# Patient Record
Sex: Female | Born: 1980 | Race: White | Hispanic: No | Marital: Single | Smoking: Never smoker
Health system: Southern US, Community
[De-identification: ages and names within clinical notes are randomized; demographics above are authoritative.]

## PROBLEM LIST (undated history)

## (undated) DIAGNOSIS — E282 Polycystic ovarian syndrome: Secondary | ICD-10-CM

## (undated) DIAGNOSIS — D497 Neoplasm of unspecified behavior of endocrine glands and other parts of nervous system: Secondary | ICD-10-CM

## (undated) DIAGNOSIS — R569 Unspecified convulsions: Secondary | ICD-10-CM

## (undated) DIAGNOSIS — K859 Acute pancreatitis without necrosis or infection, unspecified: Secondary | ICD-10-CM

## (undated) DIAGNOSIS — I10 Essential (primary) hypertension: Secondary | ICD-10-CM

## (undated) DIAGNOSIS — K861 Other chronic pancreatitis: Secondary | ICD-10-CM

## (undated) DIAGNOSIS — E271 Primary adrenocortical insufficiency: Secondary | ICD-10-CM

## (undated) DIAGNOSIS — D332 Benign neoplasm of brain, unspecified: Secondary | ICD-10-CM

## (undated) DIAGNOSIS — H9190 Unspecified hearing loss, unspecified ear: Secondary | ICD-10-CM

## (undated) DIAGNOSIS — F1290 Cannabis use, unspecified, uncomplicated: Secondary | ICD-10-CM

## (undated) DIAGNOSIS — R197 Diarrhea, unspecified: Secondary | ICD-10-CM

## (undated) DIAGNOSIS — F419 Anxiety disorder, unspecified: Secondary | ICD-10-CM

## (undated) DIAGNOSIS — Z9889 Other specified postprocedural states: Secondary | ICD-10-CM

## (undated) DIAGNOSIS — Z79891 Long term (current) use of opiate analgesic: Secondary | ICD-10-CM

## (undated) DIAGNOSIS — A0472 Enterocolitis due to Clostridium difficile, not specified as recurrent: Secondary | ICD-10-CM

## (undated) DIAGNOSIS — R519 Headache, unspecified: Secondary | ICD-10-CM

## (undated) DIAGNOSIS — T50901A Poisoning by unspecified drugs, medicaments and biological substances, accidental (unintentional), initial encounter: Secondary | ICD-10-CM

## (undated) DIAGNOSIS — R4189 Other symptoms and signs involving cognitive functions and awareness: Secondary | ICD-10-CM

## (undated) DIAGNOSIS — N200 Calculus of kidney: Secondary | ICD-10-CM

## (undated) DIAGNOSIS — T40601A Poisoning by unspecified narcotics, accidental (unintentional), initial encounter: Secondary | ICD-10-CM

## (undated) DIAGNOSIS — E876 Hypokalemia: Secondary | ICD-10-CM

## (undated) DIAGNOSIS — F1120 Opioid dependence, uncomplicated: Secondary | ICD-10-CM

## (undated) DIAGNOSIS — G40909 Epilepsy, unspecified, not intractable, without status epilepticus: Secondary | ICD-10-CM

## (undated) DIAGNOSIS — R112 Nausea with vomiting, unspecified: Secondary | ICD-10-CM

## (undated) DIAGNOSIS — R109 Unspecified abdominal pain: Secondary | ICD-10-CM

## (undated) HISTORY — PX: SURGICAL HX OTHER: 99

## (undated) HISTORY — DX: Acute pancreatitis without necrosis or infection, unspecified: K85.90

## (undated) HISTORY — DX: Headache, unspecified: R51.9

## (undated) HISTORY — DX: Poisoning by unspecified drugs, medicaments and biological substances, accidental (unintentional), initial encounter: T50.901A

## (undated) HISTORY — DX: Nausea with vomiting, unspecified: R11.2

## (undated) HISTORY — PX: PR CHOLECYSTECTOMY: 47600

## (undated) HISTORY — DX: Diarrhea, unspecified: R19.7

## (undated) HISTORY — DX: Unspecified abdominal pain: R10.9

## (undated) HISTORY — PX: PR APPENDECTOMY: 44950

## (undated) HISTORY — DX: Primary adrenocortical insufficiency: E27.1

## (undated) HISTORY — DX: Opioid dependence, uncomplicated: F11.20

## (undated) HISTORY — DX: Epilepsy, unspecified, not intractable, without status epilepticus: G40.909

## (undated) HISTORY — DX: Hypokalemia: E87.6

## (undated) HISTORY — DX: Long term (current) use of opiate analgesic: Z79.891

## (undated) HISTORY — DX: Anxiety disorder, unspecified: F41.9

## (undated) HISTORY — DX: Other symptoms and signs involving cognitive functions and awareness: R41.89

## (undated) HISTORY — PX: PR TOTAL ABDOMINAL HYSTERECT W/WO RMVL TUBE OVARY: 58150

## (undated) HISTORY — DX: Neoplasm of unspecified behavior of endocrine glands and other parts of nervous system: D49.7

## (undated) HISTORY — PX: PR EGD US EXAM SURGICAL ALTER STOM DUODENUM/JEJUNUM: 43259

## (undated) HISTORY — DX: Other chronic pancreatitis: K86.1

## (undated) HISTORY — DX: Calculus of kidney: N20.0

## (undated) HISTORY — DX: Poisoning by unspecified narcotics, accidental (unintentional), initial encounter: T40.601A

## (undated) HISTORY — DX: Enterocolitis due to Clostridium difficile, not specified as recurrent: A04.72

## (undated) HISTORY — DX: Nausea with vomiting, unspecified: Z98.890

## (undated) HISTORY — PX: ELBOW SURGERY: SHX618

## (undated) HISTORY — DX: Essential (primary) hypertension: I10

## (undated) HISTORY — PX: APPENDECTOMY: SHX54

## (undated) HISTORY — PX: CHOLECYSTECTOMY: SHX55

## (undated) HISTORY — DX: Polycystic ovarian syndrome: E28.2

## (undated) HISTORY — DX: Unspecified convulsions: R56.9

## (undated) HISTORY — PX: OTHER SURGICAL HISTORY: SHX169

## (undated) HISTORY — PX: ABDOMINAL HYSTERECTOMY: SHX81

---

## 2012-07-07 DIAGNOSIS — F319 Bipolar disorder, unspecified: Secondary | ICD-10-CM | POA: Diagnosis present

## 2012-08-14 DIAGNOSIS — D352 Benign neoplasm of pituitary gland: Secondary | ICD-10-CM | POA: Insufficient documentation

## 2014-01-21 DIAGNOSIS — K861 Other chronic pancreatitis: Secondary | ICD-10-CM | POA: Insufficient documentation

## 2014-11-08 ENCOUNTER — Encounter (HOSPITAL_BASED_OUTPATIENT_CLINIC_OR_DEPARTMENT_OTHER): Payer: Self-pay | Admitting: Endocrinology

## 2014-11-09 ENCOUNTER — Encounter (HOSPITAL_BASED_OUTPATIENT_CLINIC_OR_DEPARTMENT_OTHER): Payer: Self-pay | Admitting: Endocrinology

## 2014-11-09 ENCOUNTER — Ambulatory Visit (HOSPITAL_BASED_OUTPATIENT_CLINIC_OR_DEPARTMENT_OTHER): Payer: Medicare Other | Admitting: Endocrinology

## 2014-11-09 ENCOUNTER — Ambulatory Visit (HOSPITAL_BASED_OUTPATIENT_CLINIC_OR_DEPARTMENT_OTHER): Payer: Medicare Other | Admitting: Neurological Surgery

## 2014-11-09 ENCOUNTER — Other Ambulatory Visit: Payer: Self-pay | Admitting: Emergency Medicine

## 2014-11-09 ENCOUNTER — Emergency Department
Admission: EM | Admit: 2014-11-09 | Discharge: 2014-11-09 | Disposition: A | Discharge status: Home / Self Care | Payer: Medicare Other | Source: Ambulatory Visit | Attending: Emergency Medicine | Admitting: Emergency Medicine

## 2014-11-09 ENCOUNTER — Encounter (HOSPITAL_BASED_OUTPATIENT_CLINIC_OR_DEPARTMENT_OTHER): Payer: Medicare Other | Admitting: Neurological Surgery

## 2014-11-09 ENCOUNTER — Encounter (HOSPITAL_BASED_OUTPATIENT_CLINIC_OR_DEPARTMENT_OTHER): Payer: Self-pay | Admitting: Neurological Surgery

## 2014-11-09 VITALS — BP 137/88 | HR 80 | Temp 97.9°F | Ht 63.0 in | Wt 194.1 lb

## 2014-11-09 DIAGNOSIS — Z79899 Other long term (current) drug therapy: Secondary | ICD-10-CM | POA: Insufficient documentation

## 2014-11-09 DIAGNOSIS — R079 Chest pain, unspecified: Secondary | ICD-10-CM | POA: Insufficient documentation

## 2014-11-09 DIAGNOSIS — G8929 Other chronic pain: Secondary | ICD-10-CM | POA: Insufficient documentation

## 2014-11-09 DIAGNOSIS — E230 Hypopituitarism: Secondary | ICD-10-CM | POA: Insufficient documentation

## 2014-11-09 DIAGNOSIS — E236 Other disorders of pituitary gland: Secondary | ICD-10-CM | POA: Insufficient documentation

## 2014-11-09 DIAGNOSIS — K861 Other chronic pancreatitis: Secondary | ICD-10-CM | POA: Insufficient documentation

## 2014-11-09 DIAGNOSIS — F1120 Opioid dependence, uncomplicated: Secondary | ICD-10-CM | POA: Insufficient documentation

## 2014-11-09 DIAGNOSIS — R0602 Shortness of breath: Secondary | ICD-10-CM

## 2014-11-09 DIAGNOSIS — R9431 Abnormal electrocardiogram [ECG] [EKG]: Secondary | ICD-10-CM

## 2014-11-09 DIAGNOSIS — F319 Bipolar disorder, unspecified: Secondary | ICD-10-CM | POA: Insufficient documentation

## 2014-11-09 LAB — CBC (HEMOGRAM)
Hematocrit: 37 % (ref 36–45)
Hemoglobin: 12.8 g/dL (ref 11.5–15.5)
MCH: 30.3 pg (ref 27.3–33.6)
MCHC: 34.6 g/dL (ref 32.2–36.5)
MCV: 88 fL (ref 81–98)
Platelet Count: 202 10*3/uL (ref 150–400)
RBC: 4.22 10*6/uL (ref 3.80–5.00)
RDW-CV: 11.9 % (ref 11.6–14.4)
WBC: 5.72 10*3/uL (ref 4.3–10.0)

## 2014-11-09 LAB — CORTISOL
Cortisol: 6.2 ug/dL
Cortisol: 25.2 ug/dL

## 2014-11-09 LAB — COMPREHENSIVE METABOLIC PANEL
ALT (GPT): 15 U/L (ref 7–33)
AST (GOT): 24 U/L (ref 9–38)
Albumin: 4.3 g/dL (ref 3.5–5.2)
Alkaline Phosphatase (Total): 63 U/L (ref 25–100)
Anion Gap: 8 (ref 4–12)
Bilirubin (Total): 0.4 mg/dL (ref 0.2–1.3)
Calcium: 9.3 mg/dL (ref 8.9–10.2)
Carbon Dioxide, Total: 24 meq/L (ref 22–32)
Chloride: 104 meq/L (ref 98–108)
Creatinine: 0.66 mg/dL (ref 0.38–1.02)
GFR, Calc, African American: >60 mL/min (ref >59)
GFR, Calc, European American: >60 mL/min (ref >59)
Glucose: 89 mg/dL (ref 62–125)
Potassium: 3.5 meq/L — ABNORMAL LOW (ref 3.6–5.2)
Protein (Total): 7.1 g/dL (ref 6.0–8.2)
Sodium: 136 meq/L (ref 135–145)
Urea Nitrogen: 9 mg/dL (ref 8–21)

## 2014-11-09 LAB — T4, FREE: Thyroxine (Free): 1.1 ng/dL (ref 0.6–1.2)

## 2014-11-09 LAB — TROPONIN_I
Troponin_I Interpretation: NORMAL
Troponin_I Interpretation: NORMAL
Troponin_I: <0.03 ng/mL (ref <0.04)
Troponin_I: <0.03 ng/mL (ref <0.04)

## 2014-11-09 LAB — THYROID STIMULATING HORMONE: Thyroid Stimulating Hormone: 0.966 u[IU]/mL (ref 0.400–5.000)

## 2014-11-09 LAB — D-DIMER,QUANT: D_Dimer, Quant: 0.29 ug{FEU}/mL (ref 0.00–0.59)

## 2014-11-09 LAB — ESTRADIOL: Estradiol: 79 pg/mL

## 2014-11-09 LAB — PROLACTIN: Prolactin: 25 ng/mL

## 2014-11-09 LAB — FOLLICLE STIMULATING HORMONE: Follicle Stimulating Hormone: 3 m[IU]/mL

## 2014-11-09 MED ORDER — COSYNTROPIN 0.25 MG IJ SOLR
0.2500 mg | Freq: Once | INTRAMUSCULAR | Status: AC
Start: 2014-11-09 — End: 2014-11-09
  Administered 2014-11-09: 0.25 mg via INTRAMUSCULAR

## 2014-11-09 NOTE — Progress Notes (Signed)
RN injected 0.25mg  of cosyntropin in to patient's left deltoid. Patient tolerated well. Patient instructed to have lab drawn at 1305. Directions given to [facility] lab. Patient verbalized an understanding of instructions. No questions or concerns at this time.

## 2014-11-09 NOTE — Progress Notes (Signed)
Patient received cosyntropin at per MD order at 1206 on 11/09/2014 and instructed to have lab drawn at 1306. While at lab patient complained of chest pain and was sent to our clinic. Patient found in waiting room crying with c/o chest pain and "feeling hot" at approx 1245. Patient taken to exam room via wheelchair. Caren Griffins Cottle-Bailess RN, Earnstine Regal RN, Failor MD, endocrinology fellow, and myself at bedside. VSS BP 154/97, HR 135, O2 98% RA. RRT initiated at this time. Patient diaphoretic and c/o of SOB. Patient placed on O22L NC for comfort. Patient began with seizure like activity, body was ridged and shaking, head rolled back, patient still able to open eyes on command. Patient transferred to stretcher. Stat nurse arrived. Patient escorted to ED via stretcher on O22L NC with stat nurse, Denyce Robert- Bailess RN, and myself. Handoff report given to ED team.

## 2014-11-09 NOTE — Progress Notes (Signed)
This encounter was opened in error.

## 2014-11-10 NOTE — Progress Notes (Deleted)
This note was dictated by Failor, Richard Alan, MD.

## 2014-11-11 NOTE — Progress Notes (Signed)
I saw and evaluated Robin Simmons with Dr Letta Kocher. I agree with the findings and the plan.      Kenyon Ana M.D.

## 2014-11-11 NOTE — Progress Notes (Signed)
COMPREHENSIVE PITUITARY PROGRAM APPOINTMENT:    CHIEF COMPLAINT:  The patient is self-referred for evaluation of pituitary function in the context of empty sella syndrome.    HISTORY OF PRESENT ILLNESS:  Robin Simmons is a 34 year old female who has recently moved from [place] to [place]. She reports that back in 2008 when she was in [place], she started feeling nausea and vomiting. 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 She was then referred to an endocrinologist who started her on prednisone, initially higher dose, and then her dose was decreased. She continued on glucocorticoid replacement up until the end of 2013, which was the last time she saw her endocrinologist. She then moved from [place] initially to [place] in 2014 and later that same year, moved to [place] state. She is currently living in [place] and has not seen an endocrinologist in [place] state. She is now here to see [place] to establish ongoing care. She reports that she was admitted for a month in [place] at [facility], and also she was admitted for 14 days to [facility] in 2014 because of a C. difficile colitis, which she probably got during her hospital stay in [place].  Review of [place] records sent to [place] show that in 2010, she was admitted to the [facility] multiple times with pain, probably due to chronic pancreatitis. In 2010, she reported to doctors that she has had elevated prolactin and was told in 2008 that she had a pituitary mass. A brain MRI was done back then which showed an empty sella syndrome. An endocrinologist was consulted, who per the notes started her on cabergoline, and also because her cortisol was 0.4 and she did not sustain well, she was started on glucocorticoid replacement. Today, she tells me that she has never been on cabergoline and does not even recognize the name.  Today, the  patient reports that over the past year, she has been feeling worse than before. She used to weigh 120-125 pounds all her adult life. However, her weight has gone up to 194 pounds over the past year. She has been having headaches every day, mostly in the morning and then it starts to get better during the day. She wakes up every morning with nausea, vomiting, and describes these feelings like morning sickness. As the day goes by, she usually feels better. She feels that her hair is breaking off easier. Sometimes she has a headache when she is brushing her hair. She feels her visual field is intact; however, her left-sided fusion has gotten blurry over the past years. Sometimes she has palpitations which prevent her from sleeping. She has increased sweating. She sometimes feels too hot and sometimes feels too cold. She has been having hot flashes. She is status post hysterectomy in 2009, but the hot flashes are a new problem for her over the past year. She usually has diarrhea, which she attributes to her cholecystectomy. She sometimes has positional dizziness. She reports that for the past year, she has been having seizures with sexual intercourse. These seem to be palpitations and seizure-like activity of her extremities, and usually her husband can talk her out of these episodes. She also notes that she has been having breast milk production all the time for the past 8 years since 2008. However, when I wanted to examine her and since I could not express any breast milk, she noted  that she has breast milk production only 1 week out of every month, but this is on a regular basis every 4 weeks.  Review of her referral notes also show that in October 2015, she had a prolactin of 77.2, checked by primary care provider. She has had multiple imagings from her abdomen as well as hepatobiliary scan and endoscopic ultrasound.    PAST MEDICAL HISTORY:  1. Nausea and vomiting, which is chronic.  2. Bipolar disorder, which is  unmedicated at this time.  3. Chronic pain.  4. Chronic pancreatitis.    PAST SURGICAL HISTORY:  1. Hysterectomy.  2. Cholecystectomy.  3. Appendectomy.  4. Cesarean section x3.  5. ERCP in 2009.  6. Ureteric stent x2.  7. Biliary stent.    ALLERGIES:  BENADRYL, KETOROLAC, MORPHINE.    FAMILY HISTORY:   Her mother died of pancreatic cancer in her 49s. She was a smoker. Uncle died of complications from type 2 diabetes. She has 4 kids, and the youngest one went into precocious puberty at age 74, and she has been getting implants once a year, most recently, as she turned 48, she was told by her endocrinologist that they are going to let her go into puberty.    SOCIAL HISTORY:  She denies smoking ever. She denies drinking alcohol. She has been using medical marijuana since 2007, and when asked how much she smokes, she reported "a lot." Denies use of other illicit drugs. She is, however, on methadone 10 mg q. 6 hours. She is originally from [place], moved to [place] initially in 2014 and then moved to [place], [place], in late 2014. She is living with her current husband. She has 4 kids, aged 72, 61, 22, and 77, who are all living with their father back in [place]. They come here and visit her in the summer every year.    MEDICATIONS:  1. Dilaudid 2 mg p.o. q. 6 hours.  2. Methadone 10 mg p.o. 6 times per day.  3. Phenergan 25 mg p.o. as needed, which she is taking about 4-6 times a day.    REVIEW OF SYSTEMS:   A complete review of systems was done and is positive for lack of energy, memory trouble, trouble concentrating, headaches, trouble sleeping, depression, crying spells, excessive worry, anxiety, increased sweating, blurry vision in one eye, dizziness at times, ear pain, hearing trouble, ringing in the ears, teeth trouble, palpitations, frequent urination, sexual problems, chronic back pain, unexplained weight gain, intolerance of both heat and cold, night sweats, frequent nausea and vomiting,  frequent stomach pain, and chronic diarrhea.    PHYSICAL EXAMINATION:   Vital Signs:   Height 5 feet 3 inches, weight 194, BMI 34.46. Blood pressure 137/88, temperature 97.9, pulse 80, O2 saturation 100% on room air.  General Appearance:   Well-developed woman in no acute distress.  Heent:   Normocephalic, atraumatic. No conjunctival pallor. No scleral icterus. Extraocular muscles are intact. Visual field is intact on confrontational exam. Trachea is midline. Thyroid is about normal size and consistency, without cervical lymphadenopathy.  Chest:   Unlabored breathing. No wheezing. Clear to auscultation bilaterally.  Cardiovascular:   Regular rhythm and rate. S1, S2, with no murmurs, rubs, or gallops.  Breasts:   Bilateral breasts examined, and there is no galactorrhea when putting pressure on the breasts. There are one or two thick hairs around the areola on each side.  Abdomen:   Soft, whitish stretch marks are seen on the abdomen. No distention or  tenderness.  Extremities:   No edema.  Skin:   There are a few thick hairs under the chin, which the patient has been shaving. Skin is not excessively moist or dry.    LABS AND IMAGING:   October 2015^: Prolactin is 77.2. MRI of the pituitary done in 2010 was reviewed by our team and is notable for partially empty sella, with pituitary seen in a narrow rim and intact pituitary stalk.    ASSESSMENT AND PLAN:   Ms. Rosekrans is a 34 year old female with chronic pancreatitis, chronic pain, on medical marijuana and methadone, who is being seen by [place] for an empty sella syndrome. She was previously started on glucocorticoids after not being responsive to cosyntropin stim test, however has been off glucocorticoid replacement for the past 2-1/2 years and has not gotten into any trouble, which makes [place] believe that she probably has an intact HPA axis. We will further test her pituitary function with a prolactin, FSH and estradiol, H and free T4, well as a cosyntropin stim test  today.  Based on the results, we will decide on further followup plan.  The patient was seen and discussed with Dr. Lenna Gilford.  We had the patient undergo the baseline blood test in our clinic and injected cosyntropin for her, and she was supposed to show up an hour later to the lab to get her 60-minute cortisol. She presented to the lab with palpitations, chest pain and shortness of breath, so she was referred back to our clinic, where she was found to have pressure-like chest pain and shortness of breath. Her heart rate was 140, with O2 saturation of 100% on room air. She then had tonic-clonic activity of the upper extremities without tongue bite or urinary or fecal incontinence, so arrangements were made, and she was sent to the Emergency Department. I personally spoke to the emergency department physician and charge nurse, and they will take care of her. Also, they will draw the blood test that we had ordered previously.    Addendum - 11/11/2014:  Results for JUDEE, HENNICK (MRN [id number]) as of 11/11/2014 10:19   Ref. Range 11/09/2014 11:59 11/09/2014 13:55   Thyroxine (Free) Latest Ref Range: 0.6-1.2 ng/dL  1.1   Thyroid Stimulating Hormone Latest Ref Range: 0.400-5.000 u[IU]/mL  0.966   Estradiol Latest Units: pg/mL  79   Follicle Stimulating Hormone Latest Units: m[IU]/mL  3   Prolactin Latest Units: ng/mL  25   Cortisol Sampling Site Unknown Information not p... Information not p...   Stimulation Interval, Crt Unknown BASELINE Information not p...   Cortisol Latest Units: ug/dL 6.2 25.2   Called and told pt about her WNL lab results. She was not happy to hear that her labs (specifically relating to thyroid and adrenal) were wnl. She said but they were not normal before and that she is gaining a lot of wt which is not normal for her. I explained that I can not explain her abnl labs from yrs ago, but she does have empty sella however fortunately her labs are normal now. We advise that she comes back and  sees [place] annually. She hung up the phone on me.

## 2014-12-07 ENCOUNTER — Inpatient Hospital Stay: Payer: Self-pay

## 2015-02-08 ENCOUNTER — Encounter (INDEPENDENT_AMBULATORY_CARE_PROVIDER_SITE_OTHER): Payer: Medicare Other | Admitting: Family

## 2015-02-08 NOTE — Patient Instructions (Signed)
There are a number of additional preventive services and screening tests that are used to detect or reduce risk of common conditions.    These tests are not all covered by Medicare.  Please consider whether you would like any or all of these tests done based on our discussion today.  You can make an appointment for a future visit at which time we can order any or all of these services.  At that time you will need to sign an ABN (Advanced Beneficiary Notification), which is an acknowledgement that you may be billed for these services if they are not covered by Medicare.    Commonly recommended preventive services include:    Pneumococcal vaccine - covered if you are over 64 or high risk    Influenza vaccine - covered     Hepatitis B vaccine - covered if medium/high risk (end-stage renal disease, hemophiliacs who received Factor VIII or IX concentrates, clients of institutions for the mentally retarded, persons who live in the same house as a HepB virus carrier, homosexual men, illicit injectable drug abusers)    Screening mammography - covered once a year over age 40.     Screening pap smear and pelvic exam - covered once every two years    Colorectal cancer screening - stool test is covered yearly, sigmoidoscopy every 4 years, colonoscopy every 10 years.    Screening for diabetes - covered if high risk (high blood pressure, high lipids, obesity (BMI>30), history of abnormal glucose test) or over 65 and one other risk factor: overweight (BMI 25 to 30), family history of diabetes, history of gestational diabetes or baby >9#, or certain medications (steroids).    Diabetes self management training    Bone densitometry screening - covered once every 2 yrs over age 50    Screening for glaucoma - covered yearly if diabetic, family history of glaucoma, or African American over age 50.    Nutrition counseling    Medical nutrition therapy if you have diabetes or kidney disease    Cardiovascular screening blood tests -  cholesterol screening covered every 5 years.    Screening EKG

## 2015-02-09 NOTE — Progress Notes (Signed)
error 

## 2015-06-11 ENCOUNTER — Inpatient Hospital Stay: Payer: Self-pay

## 2015-06-14 ENCOUNTER — Encounter (HOSPITAL_BASED_OUTPATIENT_CLINIC_OR_DEPARTMENT_OTHER): Payer: Self-pay

## 2015-06-22 ENCOUNTER — Ambulatory Visit: Payer: Medicare Other | Attending: Surgical | Admitting: Surgical

## 2015-06-22 ENCOUNTER — Encounter (HOSPITAL_BASED_OUTPATIENT_CLINIC_OR_DEPARTMENT_OTHER): Payer: Self-pay | Admitting: Surgical

## 2015-06-22 VITALS — BP 138/89 | HR 108 | Temp 97.9°F | Ht 63.0 in | Wt 211.6 lb

## 2015-06-22 DIAGNOSIS — Z79891 Long term (current) use of opiate analgesic: Secondary | ICD-10-CM | POA: Insufficient documentation

## 2015-06-22 DIAGNOSIS — G479 Sleep disorder, unspecified: Secondary | ICD-10-CM | POA: Insufficient documentation

## 2015-06-22 DIAGNOSIS — R519 Headache, unspecified: Secondary | ICD-10-CM

## 2015-06-22 DIAGNOSIS — G8929 Other chronic pain: Secondary | ICD-10-CM | POA: Insufficient documentation

## 2015-06-22 DIAGNOSIS — R51 Headache: Secondary | ICD-10-CM | POA: Insufficient documentation

## 2015-06-22 DIAGNOSIS — R109 Unspecified abdominal pain: Secondary | ICD-10-CM | POA: Insufficient documentation

## 2015-06-22 NOTE — Progress Notes (Signed)
Review of Systems   Constitutional: Positive for appetite change.   HENT: Positive for tinnitus.    Eyes: Negative.    Respiratory: Negative.    Cardiovascular: Negative.    Gastrointestinal: Positive for nausea, vomiting, abdominal pain and diarrhea.   Endocrine: Positive for polydipsia.   Genitourinary: Negative.    Musculoskeletal: Positive for neck pain.   Skin: Negative.    Allergic/Immunologic: Positive for environmental allergies.   Neurological: Positive for headaches.   Hematological: Negative.    Psychiatric/Behavioral: Negative.

## 2015-06-22 NOTE — Patient Instructions (Signed)
The recommendations from today's visit will be sent to your referring provider/primary care provider. Please schedule an appointment with your referring provider/primary care provider to discuss treatment options.     The [employer] has a list of exercise programs in [place], including water exercise and land based programs:   [email or web address]   The [employer] also has a list of Seniors classes which include an arthritis class:   [email or web address]    You may consider trial of gluten free diet to help your digestion problem and chronic pain.     Please let us know if you have further questions or concerns.    Follow up as needed.

## 2015-06-22 NOTE — Progress Notes (Signed)
NEW PATIENT NOTE    REFERRING PROVIDER:  Dr Verlee Rossetti, MD    PRIMARY CARE PROVIDER:  Same as above    CHIEF COMPLAINTS / REASON FOR REFERRAL: Robin Simmons is a 35 year old year-old female [facility] by Dr Donneta Romberg for evaluation and management of chronic pain.    HISTORY OF PRESENT ILLNESS:   The patient is here for [place] taking over her opioid prescriptions (methadone 60mg  and Dilaudid 6mg /day). She has been on chronic opioids by [facility] clinic in [facility] but she could not afford the copay (they do not take her insurance Medicaid) so she is looking for a pain specialist to take over her prescription. She is explain that our clinic is a consultation service. We evaluate the patients and make recommendations to our patient's referring or primary care providers about chronic pain management, but we do not take over pain medication prescriptions. Therefore, we will not be providing opioid prescriptions for her. She said she did not receive such a message or letter prior to her visit today. She voiced understanding and would like to complete this consultation without a prescription.     The patient presented with history of chronic pain in headache, nausea/vomiting, and abdominal pain and other chronic pain for approximately 8 years. 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     The patient describes her pain:  1. Headache: pain is constant behind both eyes, throbbing and sharp pain; average 7/10, worst 10; sensitive to light; has ringing in ears; headaches are worse in the morning when she first got up and gets better during the day. She wakes up every morning with nausea and vomiting, and describes these feelings like morning sickness. Sometimes she has a headache brushing hair. Occaional blurry vision. She has increased sweating, cold and heat intolerance. Hot flashes.     Pain is aggravated  by light, sound. Alleviates by dark and stay in bath tub, and lidocaine drip to her nose/sinus (only for a few hours).     She has not seen a headache specialist; pending to see a neurologist in April    The patient was diagnosed of pituitary mass in 2008 showed on brain MRI; has been on oral corticosteroid in the past. Saw Endocrinologist here at [facility] in 12/2014 with normal endocrinology lab tests. Now scheduled to see another endocrinologist in April for 2nd opinion.    the past years. Sometimes she has palpitations which prevent her from sleeping. 2. Abdominal pain: LUQ, stable over the years; sharp stabbing, pain is intermittent but daily, lasting for hours when it happens; refers to left flank; has intermittent vomiting and feels better after vomiting. Pain radiates to left flank sometimes.    Pain is aggravated by eating any type of food; has been eating low fat or nonfat diet but no help; stress makes pain worse. Alleviates by warm compresses.      She was diagnosed of chronic pancreatitis in 2008, had stents were put in in 2009 and 2011; seen GI specialist Dr Katha Cabal at [facility] 2 weeks ago who confirmed chronic pancreatitis and prescribed phenergan for her nausea and vomiting.    3. The patient has chronic pain over neck and between shoulder blades, radiating up to her head and eyes; no low back pain; no radicular type of pain; some numbness at the back of her neck if watching  TV for a prolonged time; denied focal weakness, denied bladder or bowel dysfunctions; has chronic diarrhea 5-6x/day, but no blood; no hx of IBD.    No hx of injury or accident     4. Hx of recurrent kidney stone of both sides, more on left. Intermittent left flank pain 2/2 kidney stones; stenting (2006 and 2008); currently still has stones in her left kidney.    Function: on SSI since 2011 due to pancreatitis.    The patient does walk to the stores some days about mile away; no regular exercise.     Denied depression  and anxiety, mood is ok "for the most part".    Sleep is disturbed by pain and nausea; she snores; denied gasping for air during sleep, but never wakes up feeling rested; sleeps 4h at night (good sleep); has not had sleep study.     Treatment for this pain complaint has included:  1) INTERVENTIONS:  Stenting and nasal lidocaine drip q 6 months at her current pain clinic ([facility] in [place])  2) THERAPIES:   None   MEDICATIONS  3) OPIOID MEDICATIONS:  - Currently on methadone 60mg  and Dilaudid 6mg /day, on same therapy 2-3 years, started in [place], "my function is pretty good on those medications".   - Previously tried: Demerol, morphine, oxycodone, oxycontin - "not touching my pain".   4) ANTI-SEIZURE MEDICATIONS:  Gabapentin 300mg  qhs since Dec, had more stomach pain but not helping her other pain; stopped last month  Lyrica - never tried  5) MUSCLE RELAXANTS:  Flexeril and others - no help  6) ANTI-DEPRESSANTS:  Amitriptyline - for sleep many years ago when in school made her groggy  None other antidepressant  7) SLEEP:  Tried Melatonin years ago and did not help.   8)  OTHER TREATMENTS:   None    Has never tried gluten free diet.     PAIN TRACKER REPORT:   pain intensity last week 7 /10 (top of head, neck, LUQ and left flank);    Pain interference with activity 9/10; enjoyment of life 7/10.   Quality of sleep interfered by pain: initiation 10/10; stay asleep 10/10.   Activity that is most difficult to perform: sensitivity to light brings on bad migraines daily  Side effect from pain medication no.   "bad days" past month Needed to take more pain medication none.  PHQ 1/12.   ORT 1    Past Medical History   Diagnosis Date   . Nausea with vomiting    . Accidental drug overdose    . Chronic abdominal pain    . Nausea and vomiting    . Decreased level of consciousness    . Narcotic dependence ([place])    . Chronic pancreatitis ([place])    . Abdominal pain    . Diarrhea    . Hypokalemia    . Addison's disease ([place])    .  Pituitary tumor    . Chronic pancreatitis ([place])    . Intractable nausea and vomiting    . C. difficile diarrhea    . Pancreatitis    . Pituitary tumor    . Kidney stones    . Chronically on opiate therapy    . Seizure disorder ([place])    . Opiate overdose    . PONV (postoperative nausea and vomiting)    . Anxiety    . Headache      Past Surgical History   Procedure Laterality Date   .  Total abdominal hysterect w/wo rmvl tube ovary     . Cholecystectomy     . Appendectomy     . Anesth,cesarean section     . Egd US exam surgical alter stom duodenum/jejunum     . Pancrease stenting  2009 and 2011     Family History   Problem Relation Age of Onset   . Cancer Mother    . Chronic Pain Mother    . Hypertension Mother    . Migraine Headaches Mother    . Seizures Sister    . Cancer Maternal Aunt    . Diabetes Maternal Aunt    . Hypertension Maternal Aunt    . Migraine Headaches Maternal Aunt    . Arthritis Maternal Grandfather    . Chronic Pain Maternal Grandfather    . COPD Maternal Grandfather    . Diabetes Maternal Grandfather    . GU Maternal Grandfather    . Hypertension Maternal Grandfather    . Migraine Headaches Maternal Grandfather      Social History     Social History   . Marital Status: Married     Spouse Name: N/A   . Number of Children: N/A   . Years of Education: N/A     Occupational History   . Not on file.     Social History Main Topics   . Smoking status: Never Smoker    . Smokeless tobacco: Never Used   . Alcohol Use: No   . Drug Use: Yes     Special: Marijuana   . Sexual Activity: Not on file     Other Topics Concern   . Not on file     Social History Narrative   . No narrative on file     Past medical, surgical, family and social history as above was reviewed and updated personally with the patient.    Medications and allergies also reviewed and confirmed.     The Review of Systems was provided by Robin Simmons, reviewed and entered in the MA note associated with this encounter.     OBJECTIVE:  The patient is  examined after obtaining verbal consent.   General condition:  in no apparent distress. Appropriately groomed. Overweight (BMI 38)  PSYCHOLOGICAL: Mood was euthymic, interactive and appropriate.   HEENT: head is normocepahlic and without visible deformations .   NECK: supple.  CARDIAC: No cyanosis or dependent edema; RRR  RESPIRATORY: no labored breathing; no audible wheezing; CTA, no rales or friction.  ABDOMEN: central obesity with multiple purple striae (post pregnancy per patient); BS + throughout; tenderness at LUQ and slight at epigastric; soft no rebound; normal sensation over abdomen without hyperalgesia or dysesthesia; no mass or organomegaly.   SKIN & EXTREMITIES: no changes noted of skin color, texture or temperature   NEURO: alert and oriented. Speech is clear and coherent. PERRLA; EOMI, Visual field grossly full on confrontation.  Facial expression and sensation normal and symmetrical.Intact hearing. Sensory to light touch is intact. Motor is 5/5 in all the four extremities. Reflexes are 2/4 in biceps, knees and ankles jerk, and symmetrical. Gait is stable. Able to stand on heels, toes, tandem walk without difficulty; normal unipedal stance bilaterally. Negative Romberg's and negative Pronator drift. Negative Hoffman's  MUSCULOSKELETAL:  There is marked tenderness on palpation of bilateral suboccipital area (triggered her headache) and along the neck (midline and paraspinal) as well bilateral traps (mostly tightness); no tenderness along thoracic and lumbar-sacral spine and para-spinal area.  No CVA  tenderness. Range of motion of cervical spine is relatively preserved.  Negative diffuse soft tissue tenderness.     Past records:  Dr Kenyon Ana, MD, endocrinologist at [facility] on 11/11/2014:   ASSESSMENT AND PLAN:   Robin Simmons is a 35 year old female with chronic pancreatitis, chronic pain, on medical marijuana and methadone, who is being seen by [place] for an empty sella syndrome. She was previously started on  glucocorticoids after not being responsive to cosyntropin stim test, however has been off glucocorticoid replacement for the past 2-1/2 years and has not gotten into any trouble, which makes [place] believe that she probably has an intact HPA axis. We will further test her pituitary function with a prolactin, FSH and estradiol, H and free T4, well as a cosyntropin stim test today.   Based on the results, we will decide on further followup plan.   The patient was seen and discussed with Dr. Lenna Gilford.   We had the patient undergo the baseline blood test in our clinic and injected cosyntropin for her, and she was supposed to show up an hour later to the lab to get her 60-minute cortisol. She presented to the lab with palpitations, chest pain and shortness of breath, so she was referred back to our clinic, where she was found to have pressure-like chest pain and shortness of breath. Her heart rate was 140, with O2 saturation of 100% on room air. She then had tonic-clonic activity of the upper extremities without tongue bite or urinary or fecal incontinence, so arrangements were made, and she was sent to the Emergency Department. I personally spoke to the emergency department physician and charge nurse, and they will take care of her. Also, they will draw the blood test that we had ordered previously.     Addendum - 11/11/2014:   Results for GENESIA, STRUPP (MRN [id number]) as of 11/11/2014 10:19    Ref. Range 11/09/2014 11:59 11/09/2014 13:55   Thyroxine (Free) Latest Ref Range: 0.6-1.2 ng/dL  1.1   Thyroid Stimulating Hormone Latest Ref Range: 0.400-5.000 u[IU]/mL  0.966   Estradiol Latest Units: pg/mL  79   Follicle Stimulating Hormone Latest Units: m[IU]/mL  3   Prolactin Latest Units: ng/mL  25   Cortisol Sampling Site Unknown Information not p... Information not p...   Stimulation Interval, Crt Unknown BASELINE Information not p...   Cortisol Latest Units: ug/dL 6.2 25.2   Called and told pt about her WNL lab results.  She was not happy to hear that her labs (specifically relating to thyroid and adrenal) were wnl. She said but they were not normal before and that she is gaining a lot of wt which is not normal for her. I explained that I can not explain her abnl labs from yrs ago, but she does have empty sella however fortunately her labs are normal now. We advise that she comes back and sees [place] annually. She hung up the phone on me.     GI consult by Lita Mains, PAC, on 04/01/15:  Patient has been previously seen by Dr. Earlie Counts  She has a history of idiopathic chronic pancreatitis with no prior history of alcohol use. She has had mostly moderate to severe abdominal pain requiring chronic narcotics and on a pain contract. She also has chronic nausea. Recent endoscopic ultrasound performed by one of my associates show results listed below. There is no dominant stricture at this time that needs to be restented.   She continues to have difficulty with abdominal  pain and nausea, and after seeing the nurse practitioner in followup in March underwent the gastric empty study and a HIDA scan with [facility] score. She had no evidence of gastroparesis or sphincter of Oddi disease.   The gastric emptying study was interpreted as normal and the hiada scan with [facility] score of 1/12 is not consistent with sphincter of Oddi disease.    Assessment and Plan:  Abdominal pain  Diarrhea,   Nausea.    EUS 05/12/2014  Impression: - Endosonographic imaging of the pancreas showed   sonographic changes suggestive of moderate chronic   pancreatitis.  - There was no sign of significant pathology in the   ampulla.  - Evidence of a cholecystectomy.  - There was no sign of significant pathology in the   common bile duct.  - There was no evidence of significant pathology in the   entire examined liver.  - Endosonographic images of the left adrenal gland were   unremarkable.  - Endosonographic images of the left kidney were   unremarkable.  -  Endosonographic images of the spleen were unremarkable.  - The portal vein was endosonographically normal.      ASSESSMENT:  The patient is a 35 yo female, presented with chronic headache, abdominal pain, flank pain, and neck/shoulder girdle pain.   Exam indicated LUQ tenderness, diffuse soft tissue tenderness over neck and shoulder girdle, as well as tenderness over bilateral greater occipital region; otherwise, exam is unremarkable.   Hx of chronic pancreatitis, imaging evidence on EUS; no evidence of ongoing duct obstruction.  Hx of pituitary tumor per patient report; evaluation with endocrinologist revealed normal pituitary and thyroid fxn.  Hx of recurrent kidney stones.   Chronic headache. Bilateral occipital neuralgia and occipital headache not ruled out.  Chronic pain on chronic high dose opioid; MED 624.  Severe sleep disturbance; sleep apnea not rule out. Patient is at high risk due to chronic high dose opioids.   Patient denied hx of depression or anxiety.  Opioid risk tool (ORT) scored at 1, indicating low risk for opioid misuse.  PMP today revealed as expected (by Katrina Stack)     RECOMMENDATIONS:  Discussion: Putative mechanism of her chronic pain is discussed with the patient as above.  All of patient's questions are answered to my best knowledge.    Following recommendations are discussed with the patient, and will be communicated to the patient's primary care providers. The patient is asked to schedule an appointment with referring provider/primary care provider to discuss treatment options:    Medication suggestions:  1. Lyrica trial:   she had stomach irritation to Gabapentin at low dose. Alternative to Gabapentin is Lyrica which is often effective in chronic pain management. Lyrica trial: Initiate at 25-50mg  twice daily; may be increased by 25-50mg  once or twice a day every 5-7 days, up to 300 -450 mg daily divided twice or three times a day based on tolerability and effect. Maximum  dose: 450 mg daily (dosages up to 600 mg daily were evaluated with no significant additional benefit and an increase in adverse effects). Potential benefits include improvement in sleep and pain. The most common side effects are CNS effects of sedation, cognitive impairment, and dizziness.  These side effects can be mitigated by starting dose increases at night and by making slow increases in dose. At times we suggest a larger dose at night.  If partial response with tolerable side effects, continue.  If no response or intolerable side effects,  taper and discontinue.  Call in a few days to report effect and assist with adjusting.      2. Another option is try Nortriptyline for chronic pain management and improve sleep. NORTRIPTYLINE can be started at 10 to 25 mg at night, if side effects are minimal, after 7-10 days increase by 10-25mg  at night, typical therapeutic dose for pain relief is 50-100mg  nightly.  If there is concern about side effects, one can start at 10 mg at night. Analgesia onset can take weeks. Contraindications (from Micromedex Precautions: cardiovascular disorders; may increase risk of sinus tachycardia, cardiac conduction time changes, arrhythmias, myocardial infarction (MI), and stroke). It is prudent to obtain an EKG before starting this medication to rule out any conduction delay or rhythm disturbances. We discussed possible benefits and adverse effects of nortriptyline with the patient.    3. Opioids:   The patient is discussed about potential side effects and risks of chronic opioid use, including, but not limited to: Opioid-induced hyperalgesia (a paradoxical lowering of the pain threshold associated with chronic opioid therapy), physical dependence, mental status changes, mood disturbances, tolerance, sleep apnea. Chronic opioid therapy is associated with endocrine abnormalities (including hypogonadism) in men and women, along with bone loss and a possible increased risk of  fractures.    For controlled substances and in particular opioids, we suggest for all patients mutually agreed upon goals for therapy and: A) a signed agreement regarding medication and behavior, B) check the [place] PMP regularly, C) do not provide early refills, D) random urine toxicology screening for confirmation of compliance, E) regular visits,with updated history and physical examination, F) include non-opioid and if possible non-medication treatment approaches, G) follow [place] state dosing guidelines and the 2016 CDC guidelines, and H) taper and stop the medication if it is not effective, if there are intractable side effects, or if there are difficulties with compliance.     4. Suggest re-trial of Melatonin to help sleep, can take up to 10mg /day and may try taking it 2-4 hours prior to bedtime.     Physical therapy & rehabilitation therapies:   Suggest physical therapy referral for reconditioning, core strength, posture and balance.    [employer] has a list of exercise programs in [place], including water exercise and land based programs:   [email or web address]-reyes.com/.php   [employer] also has a list of Seniors classes which include an arthritis class:   [email or web address]    Further diagnostic studies:   Consider lab tests, if not done yet - PTH (chronic pain, abdominal pain, recurrent kidney stone and etc), Tissue Transglutaminase Antibodies to rule out Celiac dz or try gluten free diet.  Consider sleep study   Cervical spine xray if not done.     Interventions:   May consider diagnostic occipital nerve block to rule out occipital headache; defer to her consult with neurologist.     Mental health therapies:  Counseling for chronic pain management and coping CBT with her local therapist.     Other:   Trial of gluten free diet.     Follow up: as needed.    Coordination of care:     We suggested that Robin Simmons discuss our consultation findings with her PCP, and other involved health care providers.

## 2015-10-05 ENCOUNTER — Inpatient Hospital Stay: Payer: Self-pay

## 2015-10-28 ENCOUNTER — Telehealth (HOSPITAL_BASED_OUTPATIENT_CLINIC_OR_DEPARTMENT_OTHER): Payer: Self-pay | Admitting: Endocrinology

## 2015-10-28 NOTE — Telephone Encounter (Signed)
(  TEXTING IS AN OPTION FOR [facility] CLINICS ONLY)  Is this a [place] clinic? No      RETURN CALL: OK to leave detailed message with anyone that answers      SUBJECT:  Appointment Request     REASON FOR REQUEST/SYMPTOMS: patient needs to schedule a yearly follow up.  She was also told to ask you to test her for Cushing's.  REFERRING PROVIDER: na  REQUEST APPOINTMENT WITH: [place]  REQUESTED DATE: flexible, TIME: flexible  UNABLE TO APPOINT BECAUSE: SOP directs TE to clinic for [facility] appointments

## 2015-11-29 ENCOUNTER — Ambulatory Visit: Payer: Medicare Other | Attending: Endocrinology | Admitting: Endocrinology

## 2015-11-29 ENCOUNTER — Encounter (HOSPITAL_BASED_OUTPATIENT_CLINIC_OR_DEPARTMENT_OTHER): Payer: Self-pay | Admitting: Endocrinology

## 2015-11-29 VITALS — BP 137/93 | HR 99 | Temp 97.8°F | Ht 62.99 in | Wt 220.6 lb

## 2015-11-29 DIAGNOSIS — E236 Other disorders of pituitary gland: Secondary | ICD-10-CM | POA: Insufficient documentation

## 2015-11-29 DIAGNOSIS — Z6839 Body mass index (BMI) 39.0-39.9, adult: Secondary | ICD-10-CM

## 2015-11-29 LAB — COMPREHENSIVE METABOLIC PANEL
ALT (GPT): 13 U/L (ref 7–33)
AST (GOT): 22 U/L (ref 9–38)
Albumin: 4.1 g/dL (ref 3.5–5.2)
Alkaline Phosphatase (Total): 66 U/L (ref 25–100)
Anion Gap: 10 (ref 4–12)
Bilirubin (Total): 0.5 mg/dL (ref 0.2–1.3)
Calcium: 9.3 mg/dL (ref 8.9–10.2)
Carbon Dioxide, Total: 26 meq/L (ref 22–32)
Chloride: 104 meq/L (ref 98–108)
Creatinine: 0.64 mg/dL (ref 0.38–1.02)
GFR, Calc, African American: >60 mL/min/{1.73_m2} (ref >59)
GFR, Calc, European American: >60 mL/min/{1.73_m2} (ref >59)
Glucose: 83 mg/dL (ref 62–125)
Potassium: 3.8 meq/L (ref 3.6–5.2)
Protein (Total): 7.1 g/dL (ref 6.0–8.2)
Sodium: 140 meq/L (ref 135–145)
Urea Nitrogen: 8 mg/dL (ref 8–21)

## 2015-11-29 LAB — TESTOSTERONE FREE & TOTAL
Sex Hormone Binding Globulin: 19 nmol/L (ref 17–136)
Testosterone Free: 25 pg/mL — ABNORMAL HIGH (ref 1–18)
Testosterone: 1 ng/mL — ABNORMAL HIGH (ref 0.0–0.8)

## 2015-11-29 LAB — THYROID STIMULATING HORMONE: Thyroid Stimulating Hormone: 1.702 u[IU]/mL (ref 0.400–5.000)

## 2015-11-29 LAB — DHEA SULFATE: Dehydroepiandrosterone Sulfate: 221 ug/dL (ref 40–315)

## 2015-11-29 LAB — T4, FREE: Thyroxine (Free): 0.8 ng/dL (ref 0.6–1.2)

## 2015-11-29 LAB — ESTRADIOL: Estradiol: 113 pg/mL

## 2015-11-29 NOTE — Progress Notes (Signed)
ALIS, SIEMENS          [id number]          11/29/2015        COMPREHENSIVE PITUITARY PROGRAM NOTE       HISTORY OF PRESENT ILLNESS     Robin Simmons is a 35 year old woman who has a relatively complex and convoluted history as well summarized in Dr. Wanita Chamberlain note from 11/09/2014.  She at one time was told she had a pituitary mass, another time was told she had elevated prolactin, but more recently in the last several times she has had MRIs she has been told that she has an empty sella.    She had a complete endocrinologic evaluation a year ago when seen in this clinic and all was normal.  She was frustrated by those results.  She felt she had multiple symptoms that would be explained by endocrinopathy.    She now wonders whether she might have actually the opposite of adrenal insufficiency which was her primary diagnosis a year ago.  She has been gaining weight.  She has hirsutism.  She says she spends an hour a day plucking hairs on her chin and upper lip, also has some periareolar hair, but no real hair otherwise.  She feels that she has dorsal fat pads and supraclavicular fat pads.  She also has palpitations, sweats excessively.  She is status post hysterectomy and therefore does not have menstrual periods nor does she have symptoms to suggest this.  She has also noted that her blood pressure tends to be high.  She has been told she has high blood sugars on occasion.  All of this makes her think that she maybe has Cushing's now instead of her previous concern that she had adrenal insufficiency.    She has been evaluated by an endocrinologist apparently near [place], was told that they thought she had PCOS and was told she had elevated testosterone level.    She also has a history of:    1. Substance abuse.  2. History of bipolar disorder.  3. History of chronic pain.  4. History of chronic pancreatitis.    MEDICATIONS     She is currently on:    1. Methadone; exact dose is unclear.  2. Phenergan as  needed.    No endocrinologic medications.    PHYSICAL EXAMINATION     GENERAL:  She is an obese woman who does not look particularly cushingoid, although does have a somewhat round face, but no plethora.  VITAL SIGNS:  Her weight is 222, BMI 39.17.  Her blood pressure is 138/90.  HEENT:  Otherwise unremarkable.  She has been plucking hair, although there is some evidence of female terminal-type hair on her chin.  Normal thyroid.  CHEST:  Clear.  CARDIAC:  Exam unremarkable.  She has a few isolated hairs periareolar.  ABDOMEN:  Obese, but otherwise benign.  She has significant left upper quadrant tenderness to deep palpation.  No rebound.  No edema.  MUSCULOSKELETAL:  She does have some mild proximal muscle weakness, at least some difficulty getting out of a squat.    IMPRESSION AND PLAN     I think she probably does not have Cushing's, but this is not an unreasonable thing to evaluate given symptoms and findings and we will do so with bedtime salivary cortisols.  We have also talked about checking her plasma metanephrines as per her request.  I have told her it is extremely unlikely, but her blood pressure  is up a little bit.  She does have symptoms that potentially could be related, although I think this is unlikely.  I have told her what to expect in terms of time course and getting results, but we will be in touch with her.

## 2015-12-06 LAB — PLASMA METANEPHRINES
Metanephrine: <0.2 nmol/L (ref 0.00–0.49)
Normetanephrine: 0.25 nmol/L (ref 0.00–0.89)

## 2015-12-19 ENCOUNTER — Other Ambulatory Visit
Admit: 2015-12-19 | Discharge: 2015-12-19 | Disposition: A | Discharge status: Home / Self Care | Payer: Medicare Other | Attending: Endocrinology | Admitting: Endocrinology

## 2015-12-19 DIAGNOSIS — E236 Other disorders of pituitary gland: Secondary | ICD-10-CM | POA: Insufficient documentation

## 2015-12-20 ENCOUNTER — Other Ambulatory Visit
Admit: 2015-12-20 | Discharge: 2015-12-20 | Disposition: A | Discharge status: Home / Self Care | Payer: Medicare Other | Attending: Endocrinology | Admitting: Endocrinology

## 2015-12-20 DIAGNOSIS — E236 Other disorders of pituitary gland: Secondary | ICD-10-CM | POA: Insufficient documentation

## 2015-12-21 ENCOUNTER — Other Ambulatory Visit
Admit: 2015-12-21 | Discharge: 2015-12-21 | Disposition: A | Discharge status: Home / Self Care | Payer: Medicare Other | Attending: Endocrinology | Admitting: Endocrinology

## 2015-12-21 DIAGNOSIS — E236 Other disorders of pituitary gland: Secondary | ICD-10-CM | POA: Insufficient documentation

## 2015-12-30 LAB — CORTISOL, SALIVA (SENDOUT)
Salivary Cortisol Result (Sendout): <50 ng/dL
Salivary Cortisol Result (Sendout): <50 ng/dL
Salivary Cortisol Result (Sendout): 108 ng/dL

## 2016-01-06 ENCOUNTER — Telehealth (HOSPITAL_BASED_OUTPATIENT_CLINIC_OR_DEPARTMENT_OTHER): Payer: Self-pay | Admitting: Endocrinology

## 2016-01-06 DIAGNOSIS — E282 Polycystic ovarian syndrome: Secondary | ICD-10-CM | POA: Insufficient documentation

## 2016-01-06 MED ORDER — NORGESTREL-ETHINYL ESTRADIOL 0.3-30 MG-MCG OR TABS
1.0000 | ORAL_TABLET | Freq: Every day | ORAL | 3 refills | Status: DC
Start: 2016-01-06 — End: 2016-03-27

## 2016-01-06 NOTE — Telephone Encounter (Signed)
Discussed labs  Does not have Cushing's  Labs and clinical presentation c/w PCOS - discussed  Have discussed metformin,BCPs and spironolactone  Have aGREED TO initiate BCPs, and re-evalaute in 3 months

## 2016-03-01 ENCOUNTER — Telehealth (HOSPITAL_BASED_OUTPATIENT_CLINIC_OR_DEPARTMENT_OTHER): Payer: Self-pay | Admitting: Endocrinology

## 2016-03-01 DIAGNOSIS — E282 Polycystic ovarian syndrome: Secondary | ICD-10-CM

## 2016-03-01 NOTE — Telephone Encounter (Signed)
Pt called to report daily severe cramping when she takes the Norgestrel-Ethinyl Estradiol that was prescribed by Dr. Lenna Gilford in October for PCOS.    Pt states she contacted her PCP regarding cramping and was instructed to discuss with Dr. Lenna Gilford.  Pt reports cramping feeling like labor contractions and that she had similar but milder cramping when she trialed oral birth control years ago which is why she was transitioned off in the past.    Message forwarded to Dr. Lenna Gilford for recommendations.

## 2016-03-27 MED ORDER — LEVONORGESTREL-ETHINYL ESTRAD 0.1-20 MG-MCG OR TABS
1.0000 | ORAL_TABLET | Freq: Every day | ORAL | 1 refills | Status: DC
Start: 2016-03-27 — End: 2017-04-01

## 2016-03-27 NOTE — Telephone Encounter (Signed)
Patient made aware of Dr. Jeannine Kitten new prescription for Levonorgestrel-Ethinyl Radene Journey that was sent to [facility] on [address] in [place]. Patient stated understanding and plan to pick up prescription. Patient to communicate tolerance of new medication with clinic.

## 2016-03-27 NOTE — Telephone Encounter (Signed)
RN reached out to patient as she had not returned call to clinic.    Patient reports abdominal cramping continues. She also notes she has acne ("like boils") on neck, new since this past month that she thinks may be related.    RN communicated Dr. Jeannine Kitten recommendations to either stop birth control pills or to try a different type. Patient would like to try a different type of BCP instead of stopping all together; she also recalls Dr. Lenna Gilford mentioning Metformin as a potential treatment option.    Patient confirmed preferred pharmacy is [facility] in [place] off of [address].    Note forwarded to Dr. Lenna Gilford for recommendations.

## 2016-04-15 ENCOUNTER — Inpatient Hospital Stay: Payer: Self-pay

## 2016-10-22 ENCOUNTER — Inpatient Hospital Stay: Payer: Self-pay

## 2017-04-01 ENCOUNTER — Telehealth (HOSPITAL_BASED_OUTPATIENT_CLINIC_OR_DEPARTMENT_OTHER): Payer: Self-pay

## 2017-04-01 DIAGNOSIS — E282 Polycystic ovarian syndrome: Secondary | ICD-10-CM

## 2017-04-01 MED ORDER — LEVONORGESTREL-ETHINYL ESTRAD 0.1-20 MG-MCG OR TABS
1.0000 | ORAL_TABLET | Freq: Every day | ORAL | 1 refills | Status: AC
Start: 2017-04-01 — End: ?

## 2017-04-01 NOTE — Telephone Encounter (Signed)
Pharmacy sent a request

## 2017-05-02 ENCOUNTER — Other Ambulatory Visit (HOSPITAL_BASED_OUTPATIENT_CLINIC_OR_DEPARTMENT_OTHER): Payer: Self-pay | Admitting: Endocrinology

## 2017-05-02 DIAGNOSIS — E282 Polycystic ovarian syndrome: Secondary | ICD-10-CM

## 2017-05-02 MED ORDER — LESSINA 0.1-20 MG-MCG OR TABS
ORAL_TABLET | ORAL | 0 refills | Status: AC
Start: 2017-05-02 — End: ?

## 2017-05-24 ENCOUNTER — Inpatient Hospital Stay: Payer: Self-pay

## 2017-06-01 ENCOUNTER — Inpatient Hospital Stay: Payer: Self-pay

## 2017-06-02 ENCOUNTER — Other Ambulatory Visit: Payer: Self-pay

## 2017-06-02 ENCOUNTER — Emergency Department (HOSPITAL_COMMUNITY)
Admission: EM | Admit: 2017-06-02 | Discharge: 2017-06-02 | Disposition: A | Discharge status: Home / Self Care | Payer: Medicare Other | Attending: Emergency Medicine | Admitting: Emergency Medicine

## 2017-06-02 ENCOUNTER — Encounter (HOSPITAL_COMMUNITY): Payer: Self-pay | Admitting: Emergency Medicine

## 2017-06-02 ENCOUNTER — Emergency Department (HOSPITAL_COMMUNITY): Payer: Medicare Other

## 2017-06-02 DIAGNOSIS — Z79899 Other long term (current) drug therapy: Secondary | ICD-10-CM | POA: Insufficient documentation

## 2017-06-02 DIAGNOSIS — E860 Dehydration: Secondary | ICD-10-CM

## 2017-06-02 DIAGNOSIS — K2970 Gastritis, unspecified, without bleeding: Secondary | ICD-10-CM | POA: Insufficient documentation

## 2017-06-02 DIAGNOSIS — K2900 Acute gastritis without bleeding: Secondary | ICD-10-CM

## 2017-06-02 DIAGNOSIS — E876 Hypokalemia: Secondary | ICD-10-CM | POA: Insufficient documentation

## 2017-06-02 HISTORY — DX: Benign neoplasm of brain, unspecified: D33.2

## 2017-06-02 HISTORY — DX: Acute pancreatitis without necrosis or infection, unspecified: K85.90

## 2017-06-02 HISTORY — DX: Neoplasm of unspecified behavior of endocrine glands and other parts of nervous system: D49.7

## 2017-06-02 HISTORY — DX: Primary adrenocortical insufficiency: E27.1

## 2017-06-02 LAB — COMPREHENSIVE METABOLIC PANEL
ALBUMIN: 4.7 g/dL (ref 3.5–5.0)
ALT: 19 U/L (ref 14–54)
ANION GAP: 16 — AB (ref 5–15)
AST: 37 U/L (ref 15–41)
Alkaline Phosphatase: 56 U/L (ref 38–126)
BUN: 17 mg/dL (ref 6–20)
CO2: 23 mmol/L (ref 22–32)
Calcium: 9.6 mg/dL (ref 8.9–10.3)
Chloride: 102 mmol/L (ref 101–111)
Creatinine, Ser: 1.71 mg/dL — ABNORMAL HIGH (ref 0.44–1.00)
GFR calc Af Amer: 44 mL/min — ABNORMAL LOW (ref >60)
GFR calc non Af Amer: 38 mL/min — ABNORMAL LOW (ref >60)
GLUCOSE: 95 mg/dL (ref 65–99)
POTASSIUM: 2.7 mmol/L — AB (ref 3.5–5.1)
Sodium: 141 mmol/L (ref 135–145)
Total Bilirubin: 0.9 mg/dL (ref 0.3–1.2)
Total Protein: 7.9 g/dL (ref 6.5–8.1)

## 2017-06-02 LAB — CBC
HEMATOCRIT: 42.4 % (ref 36.0–46.0)
HEMOGLOBIN: 14.4 g/dL (ref 12.0–15.0)
MCH: 29.9 pg (ref 26.0–34.0)
MCHC: 34 g/dL (ref 30.0–36.0)
MCV: 88.1 fL (ref 78.0–100.0)
Platelets: 250 10*3/uL (ref 150–400)
RBC: 4.81 MIL/uL (ref 3.87–5.11)
RDW: 12.7 % (ref 11.5–15.5)
WBC: 12.6 10*3/uL — ABNORMAL HIGH (ref 4.0–10.5)

## 2017-06-02 LAB — POC OCCULT BLOOD, ED: Fecal Occult Bld: NEGATIVE

## 2017-06-02 LAB — LIPASE, BLOOD: LIPASE: 36 U/L (ref 11–51)

## 2017-06-02 MED ORDER — ONDANSETRON HCL 4 MG/2ML IJ SOLN
4.0000 mg | Freq: Once | INTRAMUSCULAR | Status: AC
  Administered 2017-06-02: 4 mg via INTRAVENOUS

## 2017-06-02 MED ORDER — POTASSIUM CHLORIDE 10 MEQ/100ML IV SOLN
10.0000 meq | INTRAVENOUS | Status: AC
  Administered 2017-06-02 (×2): 10 meq via INTRAVENOUS
  Filled 2017-06-02 (×2): qty 100

## 2017-06-02 MED ORDER — ONDANSETRON HCL 4 MG/2ML IJ SOLN
INTRAMUSCULAR | Status: AC
  Filled 2017-06-02: qty 2

## 2017-06-02 MED ORDER — OMEPRAZOLE 40 MG PO CPDR: 40.0000 mg | DELAYED_RELEASE_CAPSULE | Freq: Every day | ORAL | 0 refills | Status: DC

## 2017-06-02 MED ORDER — SUCRALFATE 1 GM/10ML PO SUSP
1.0000 g | Freq: Once | ORAL | Status: AC
  Administered 2017-06-02: 1 g via ORAL
  Filled 2017-06-02: qty 10

## 2017-06-02 MED ORDER — SUCRALFATE 1 G PO TABS: 1.0000 g | ORAL_TABLET | Freq: Three times a day (TID) | ORAL | 0 refills | Status: DC

## 2017-06-02 MED ORDER — PROMETHAZINE HCL 25 MG RE SUPP: 25.0000 mg | Freq: Four times a day (QID) | RECTAL | 0 refills | Status: DC | PRN

## 2017-06-02 MED ORDER — SODIUM CHLORIDE 0.9 % IV BOLUS (SEPSIS)
1000.0000 mL | Freq: Once | INTRAVENOUS | Status: AC
  Administered 2017-06-02: 1000 mL via INTRAVENOUS

## 2017-06-02 MED ORDER — PANTOPRAZOLE SODIUM 40 MG IV SOLR
40.0000 mg | Freq: Once | INTRAVENOUS | Status: AC
Start: 2017-06-02 — End: 2017-06-02
  Administered 2017-06-02: 40 mg via INTRAVENOUS
  Filled 2017-06-02: qty 40

## 2017-06-02 MED ORDER — ONDANSETRON HCL 4 MG/2ML IJ SOLN
INTRAMUSCULAR | Status: AC
  Administered 2017-06-02: 4 mg
  Filled 2017-06-02: qty 2

## 2017-06-02 MED ORDER — PROMETHAZINE HCL 25 MG/ML IJ SOLN
25.0000 mg | Freq: Once | INTRAMUSCULAR | Status: AC
  Administered 2017-06-02: 25 mg via INTRAVENOUS
  Filled 2017-06-02: qty 1

## 2017-06-02 MED ORDER — HYDROMORPHONE HCL 1 MG/ML IJ SOLN
1.0000 mg | Freq: Once | INTRAMUSCULAR | Status: AC
  Administered 2017-06-02: 1 mg via INTRAVENOUS
  Filled 2017-06-02: qty 1

## 2017-06-02 MED ORDER — POTASSIUM CHLORIDE CRYS ER 20 MEQ PO TBCR
40.0000 meq | EXTENDED_RELEASE_TABLET | Freq: Once | ORAL | Status: DC
  Filled 2017-06-02: qty 2

## 2017-06-02 NOTE — ED Notes (Signed)
From CT 

## 2017-06-02 NOTE — ED Notes (Signed)
Pt refuses to take K at this time reports she is N   Also continues to have pain

## 2017-06-02 NOTE — ED Notes (Signed)
Pt noted to be tremulous and with restless movement of fingers She has a tremulous tongue

## 2017-06-02 NOTE — ED Notes (Signed)
CRITICAL VALUE ALERT  Critical Value:  Potassium 2.7  Date & Time Notied:  06/02/17 1720  Provider Notified: Dr. Lita Mains  Orders Received/Actions taken: EPD notified, no further orders given at this time.

## 2017-06-02 NOTE — ED Provider Notes (Signed)
[facility] EMERGENCY DEPARTMENT Provider Note   CSN: [id number] Arrival date & time: 06/02/17  1605     History   Chief Complaint Chief Complaint  Patient presents with  . Abdominal Pain    HPI Brittany Mullins is a 37 y.o. female.  HPI Patient states she has had upper abdominal pain for the past 2.5 weeks.  Had associated vomiting with coffee-ground emesis.  States she is also had sore throat and fever at home.  Decreased urination today.  Patient also complains of left flank pain.  Denies diarrhea, melanotic or bloody stools.  States that she recently moved from [place] state to [place].  She has been able to find a primary physician.  Has been out of her normal medications for the past month, including cortisol which she takes for her Addison's disease.  States she is been taking Zofran and Phenergan at home with little relief.  Her friends gave her Tylenol and Motrin yesterday. Past Medical History:  Diagnosis Date  . Addison's disease ([place])   . Brain tumor (benign) ([place])   . Pancreatitis   . Pituitary tumor     There are no active problems to display for this patient.   Past Surgical History:  Procedure Laterality Date  . ABDOMINAL HYSTERECTOMY    . CHOLECYSTECTOMY    . ELBOW SURGERY    . kidney stent    . pancreatic stent      OB History    No data available       Home Medications    Prior to Admission medications   Medication Sig Start Date End Date Taking? Authorizing Provider  acetaminophen (TYLENOL) 500 MG tablet Take 1,000 mg by mouth every 6 (six) hours as needed for headache.   Yes [provider]  LESSINA-28 0.1-20 MG-MCG tablet Take 1 tablet by mouth daily. 05/02/17  Yes [provider]  ofloxacin (OCUFLOX) 0.3 % ophthalmic solution Place 5 drops into both ears 2 (two) times daily. 05/30/17  Yes [provider]  ondansetron (ZOFRAN-ODT) 4 MG disintegrating tablet Take 1 tablet by mouth every 6 (six) hours as needed.  05/29/17  Yes [provider]  potassium chloride SA (K-DUR,KLOR-CON) 20 MEQ tablet Take 1 tablet by mouth daily. 06/01/17  Yes [provider]  promethazine (PHENERGAN) 25 MG tablet Take 1 tablet by mouth 3 (three) times daily as needed. 06/01/17  Yes [provider]  omeprazole (PRILOSEC) 40 MG capsule Take 1 capsule (40 mg total) by mouth daily. 06/02/17   Julianne Rice, MD  promethazine (PHENERGAN) 25 MG suppository Place 1 suppository (25 mg total) rectally every 6 (six) hours as needed for nausea or vomiting. 06/02/17   Julianne Rice, MD  sucralfate (CARAFATE) 1 g tablet Take 1 tablet (1 g total) by mouth 4 (four) times daily -  with meals and at bedtime. 06/02/17   Julianne Rice, MD    Family History No family history on file.  Social History Social History   Tobacco Use  . Smoking status: Never Smoker  . Smokeless tobacco: Never Used  Substance Use Topics  . Alcohol use: No    Frequency: Never  . Drug use: No     Allergies   Bee venom and Toradol [ketorolac tromethamine]   Review of Systems Review of Systems  Constitutional: Positive for chills and fever.  HENT: Positive for sore throat. Negative for trouble swallowing.   Respiratory: Negative for cough and shortness of breath.   Cardiovascular: Negative for chest pain,  palpitations and leg swelling.  Gastrointestinal: Positive for abdominal pain, nausea and vomiting. Negative for abdominal distention, blood in stool, constipation and diarrhea.  Genitourinary: Positive for difficulty urinating, flank pain and hematuria. Negative for dysuria, pelvic pain, vaginal bleeding and vaginal discharge.  Musculoskeletal: Positive for back pain. Negative for neck pain and neck stiffness.  Skin: Negative for rash and wound.  Neurological: Negative for dizziness, weakness, light-headedness, numbness and headaches.  All other systems reviewed and are negative.    Physical Exam Updated Vital Signs BP  119/74   Pulse 93   Temp 98.7 F (37.1 C) (Oral)   Resp 20   Ht 5\' 3"  (1.6 m)   Wt 77.1 kg (170 lb)   SpO2 99%   BMI 30.11 kg/m   Physical Exam  Constitutional: She is oriented to person, place, and time. She appears well-developed and well-nourished. She appears distressed.  Patient writhing around in bed  HENT:  Head: Normocephalic and atraumatic.  Mouth/Throat: Oropharynx is clear and moist.  Oropharynx is mildly erythematous.  No tonsillar exudates.  Uvula is midline.  Eyes: EOM are normal. Pupils are equal, round, and reactive to light.  Neck: Normal range of motion. Neck supple. No JVD present. No tracheal deviation present. No thyromegaly present.  Cardiovascular: Normal rate and regular rhythm. Exam reveals no gallop and no friction rub.  No murmur heard. Pulmonary/Chest: Effort normal and breath sounds normal. No stridor. No respiratory distress. She has no wheezes. She has no rales. She exhibits no tenderness.  Abdominal: Soft. Bowel sounds are normal. There is tenderness. There is no rebound and no guarding.  Patient with epigastric and left upper quadrant tenderness to palpation.  No rebound or guarding.  Musculoskeletal: Normal range of motion. She exhibits no edema or tenderness.  Left CVA tenderness to percussion.  No definite midline thoracic or lumbar tenderness.  No lower extremity swelling or asymmetry.  Lymphadenopathy:    She has no cervical adenopathy.  Neurological: She is alert and oriented to person, place, and time.  Moving all extremities without focal deficit.  Sensation intact.  Skin: Skin is warm and dry. No rash noted. No erythema.  Psychiatric:  Histrionic behavior  Nursing note and vitals reviewed.    ED Treatments / Results  Labs (all labs ordered are listed, but only abnormal results are displayed) Labs Reviewed  COMPREHENSIVE METABOLIC PANEL - Abnormal; Notable for the following components:      Result Value   Potassium 2.7 (*)     Creatinine, Ser 1.71 (*)    GFR calc non Af Amer 38 (*)    GFR calc Af Amer 44 (*)    Anion gap 16 (*)    All other components within normal limits  CBC - Abnormal; Notable for the following components:   WBC 12.6 (*)    All other components within normal limits  LIPASE, BLOOD  URINALYSIS, ROUTINE W REFLEX MICROSCOPIC  POC OCCULT BLOOD, ED    EKG  EKG Interpretation None       Radiology Ct Renal Stone Study  Result Date: 06/02/2017 CLINICAL DATA:  37 year old female with history of abdominal pain and vomiting for the past 2 and half weeks. Cough a ground emesis. Fever and sore throat since yesterday. EXAM: CT ABDOMEN AND PELVIS WITHOUT CONTRAST TECHNIQUE: Multidetector CT imaging of the abdomen and pelvis was performed following the standard protocol without IV contrast. COMPARISON:  No priors. FINDINGS: Lower chest: Unremarkable. Hepatobiliary: No definite cystic or solid hepatic lesions are  confidently identified on today's noncontrast CT examination. Status post cholecystectomy. Pancreas: No definite pancreatic mass or peripancreatic fluid or inflammatory changes are noted on today's noncontrast CT examination. Spleen: Numerous calcified granulomas in the spleen. Adrenals/Urinary Tract: Multiple small nonobstructive calculi are noted within the collecting systems of both kidneys measuring up to 5 mm in the upper pole collecting system of the left kidney. No additional calculi are noted along the course of either ureter or within the lumen of the urinary bladder. No hydroureteronephrosis. Unenhanced appearance of the urinary bladder is normal. Bilateral adrenal glands are normal in appearance. Stomach/Bowel: Unenhanced appearance of the stomach is normal. There is no pathologic dilatation of small bowel or colon. The appendix is not confidently identified and may be surgically absent. Regardless, there are no inflammatory changes noted adjacent to the cecum to suggest the presence of an acute  appendicitis at this time. Vascular/Lymphatic: No atherosclerotic calcifications or definite aneurysm identified in the abdominal or pelvic vasculature. No lymphadenopathy noted in the abdomen or pelvis. Reproductive: Status post hysterectomy. Ovaries are not confidently identified may be surgically absent or atrophic. Other: No significant volume of ascites.  No pneumoperitoneum. Musculoskeletal: There are no aggressive appearing lytic or blastic lesions noted in the visualized portions of the skeleton. IMPRESSION: 1. No acute findings are noted in the abdomen or pelvis to account for the patient's symptoms. 2. Several nonobstructive calculi are noted within both renal collecting systems. No ureteral stones or findings of urinary tract obstruction are noted at this time. 3. Old granulomatous disease in the spleen. 4. Status post cholecystectomy. Electronically Signed   By: Vinnie Langton M.D.   On: 06/02/2017 17:56    Procedures Procedures (including critical care time)  Medications Ordered in ED Medications  ondansetron (ZOFRAN) 4 MG/2ML injection (not administered)  ondansetron (ZOFRAN) 4 MG/2ML injection (4 mg  Given 06/02/17 1816)  ondansetron (ZOFRAN) injection 4 mg (4 mg Intravenous Given 06/02/17 1638)  HYDROmorphone (DILAUDID) injection 1 mg (1 mg Intravenous Given 06/02/17 1712)  pantoprazole (PROTONIX) injection 40 mg (40 mg Intravenous Given 06/02/17 1713)  sodium chloride 0.9 % bolus 1,000 mL (0 mLs Intravenous Stopped 06/02/17 1806)  sodium chloride 0.9 % bolus 1,000 mL (1,000 mLs Intravenous New Bag/Given 06/02/17 1916)  potassium chloride 10 mEq in 100 mL IVPB (0 mEq Intravenous Stopped 06/02/17 2143)  sucralfate (CARAFATE) 1 GM/10ML suspension 1 g (1 g Oral Given 06/02/17 1918)  promethazine (PHENERGAN) injection 25 mg (25 mg Intravenous Given 06/02/17 1917)     Initial Impression / Assessment and Plan / ED Course  I have reviewed the triage vital signs and the nursing notes.  Pertinent labs  & imaging results that were available during my care of the patient were reviewed by me and considered in my medical decision making (see chart for details).    Patient presents with epigastric pain, nausea and vomiting.  CT abdomen pelvis without acute findings.  Laboratory workup significant for hypokalemia and related creatinine.  No baseline creatinine for comparison.  Likely due to dehydration.  Patient given 2 L IV fluids and IV potassium replacement in the emergency department.  Initially treated with IV Dilaudid and Zofran.  Had persistent nausea and abdominal pain.  Given IV Protonix, Phenergan and Carafate.  Though she still complains of abdominal pain she has had no vomiting while here in the emergency department.  She appears much more comfortable.  [place]  She is advised to follow-up with a gastroenterologist  and primary provider.  If she remains in [place] she is given follow-up with Dr. Oneida Alar.   Final Clinical Impressions(s) / ED Diagnoses   Final diagnoses:  Acute gastritis without hemorrhage, unspecified gastritis type  Dehydration  Hypokalemia    ED Discharge Orders        Ordered    promethazine (PHENERGAN) 25 MG suppository  Every 6 hours PRN     06/02/17 2221    omeprazole (PRILOSEC) 40 MG capsule  Daily     06/02/17 2221    sucralfate (CARAFATE) 1 g tablet  3 times daily with meals & bedtime     06/02/17 2221       Julianne Rice, MD 06/02/17 2225

## 2017-06-02 NOTE — ED Notes (Signed)
Pt continues to complain of N  States has abd pain  Is smiling and conversant

## 2017-06-02 NOTE — ED Triage Notes (Addendum)
Pt reports vomiting and abdominal pain x 2.5 weeks. States vomit looks like coffee grounds. Pt also endorses fever and sore throat that began yesterday. Pt took Motrin around 1500. Pt dry heaving.

## 2017-06-02 NOTE — ED Notes (Signed)
Pt has not vomited since being roomed  She is resting awaiting second K

## 2017-06-02 NOTE — Discharge Instructions (Signed)
Do not take ibuprofen, naproxen or any nonsteroidal anti-inflammatory medication.  Take Prilosec as directed.  You may use Phenergan suppositories if you are not able to tolerate Phenergan tablets.  If you remain in [place], follow-up with gastroenterologist here or schedule an appointment to follow-up with a gastroenterologist in [place].

## 2017-06-02 NOTE — ED Notes (Signed)
POC stool  guaic neg

## 2017-06-02 NOTE — ED Notes (Signed)
To CT

## 2017-06-03 ENCOUNTER — Inpatient Hospital Stay (HOSPITAL_COMMUNITY)
Admission: EM | Admit: 2017-06-03 | Discharge: 2017-06-05 | DRG: 103 | Disposition: A | Discharge status: Home / Self Care | Payer: Medicare Other | Attending: Internal Medicine | Admitting: Internal Medicine

## 2017-06-03 ENCOUNTER — Observation Stay (HOSPITAL_COMMUNITY): Payer: Medicare Other

## 2017-06-03 ENCOUNTER — Encounter (HOSPITAL_COMMUNITY): Payer: Self-pay | Admitting: Emergency Medicine

## 2017-06-03 DIAGNOSIS — G43A0 Cyclical vomiting, not intractable: Secondary | ICD-10-CM | POA: Diagnosis not present

## 2017-06-03 DIAGNOSIS — I248 Other forms of acute ischemic heart disease: Secondary | ICD-10-CM | POA: Diagnosis present

## 2017-06-03 DIAGNOSIS — E271 Primary adrenocortical insufficiency: Secondary | ICD-10-CM | POA: Diagnosis present

## 2017-06-03 DIAGNOSIS — F1120 Opioid dependence, uncomplicated: Secondary | ICD-10-CM | POA: Diagnosis present

## 2017-06-03 DIAGNOSIS — R109 Unspecified abdominal pain: Secondary | ICD-10-CM

## 2017-06-03 DIAGNOSIS — E860 Dehydration: Secondary | ICD-10-CM | POA: Diagnosis not present

## 2017-06-03 DIAGNOSIS — G894 Chronic pain syndrome: Secondary | ICD-10-CM | POA: Diagnosis present

## 2017-06-03 DIAGNOSIS — Z765 Malingerer [conscious simulation]: Secondary | ICD-10-CM

## 2017-06-03 DIAGNOSIS — Y92009 Unspecified place in unspecified non-institutional (private) residence as the place of occurrence of the external cause: Secondary | ICD-10-CM

## 2017-06-03 DIAGNOSIS — E876 Hypokalemia: Secondary | ICD-10-CM | POA: Diagnosis not present

## 2017-06-03 DIAGNOSIS — Z91030 Bee allergy status: Secondary | ICD-10-CM

## 2017-06-03 DIAGNOSIS — R079 Chest pain, unspecified: Secondary | ICD-10-CM

## 2017-06-03 DIAGNOSIS — K861 Other chronic pancreatitis: Secondary | ICD-10-CM | POA: Diagnosis present

## 2017-06-03 DIAGNOSIS — Z86011 Personal history of benign neoplasm of the brain: Secondary | ICD-10-CM

## 2017-06-03 DIAGNOSIS — Z79899 Other long term (current) drug therapy: Secondary | ICD-10-CM

## 2017-06-03 DIAGNOSIS — R1084 Generalized abdominal pain: Secondary | ICD-10-CM | POA: Diagnosis not present

## 2017-06-03 DIAGNOSIS — R112 Nausea with vomiting, unspecified: Secondary | ICD-10-CM

## 2017-06-03 DIAGNOSIS — R740 Nonspecific elevation of levels of transaminase and lactic acid dehydrogenase [LDH]: Secondary | ICD-10-CM | POA: Diagnosis present

## 2017-06-03 DIAGNOSIS — R000 Tachycardia, unspecified: Secondary | ICD-10-CM | POA: Diagnosis present

## 2017-06-03 DIAGNOSIS — F1290 Cannabis use, unspecified, uncomplicated: Secondary | ICD-10-CM | POA: Diagnosis not present

## 2017-06-03 DIAGNOSIS — Z8639 Personal history of other endocrine, nutritional and metabolic disease: Secondary | ICD-10-CM

## 2017-06-03 DIAGNOSIS — F19939 Other psychoactive substance use, unspecified with withdrawal, unspecified: Secondary | ICD-10-CM | POA: Diagnosis present

## 2017-06-03 DIAGNOSIS — R1110 Vomiting, unspecified: Secondary | ICD-10-CM | POA: Diagnosis present

## 2017-06-03 DIAGNOSIS — Z885 Allergy status to narcotic agent status: Secondary | ICD-10-CM

## 2017-06-03 DIAGNOSIS — I10 Essential (primary) hypertension: Secondary | ICD-10-CM | POA: Diagnosis present

## 2017-06-03 DIAGNOSIS — G43A1 Cyclical vomiting, intractable: Secondary | ICD-10-CM

## 2017-06-03 DIAGNOSIS — T407X5A Adverse effect of cannabis (derivatives), initial encounter: Secondary | ICD-10-CM | POA: Diagnosis present

## 2017-06-03 DIAGNOSIS — R1115 Cyclical vomiting syndrome unrelated to migraine: Secondary | ICD-10-CM

## 2017-06-03 DIAGNOSIS — Z9049 Acquired absence of other specified parts of digestive tract: Secondary | ICD-10-CM

## 2017-06-03 DIAGNOSIS — Z888 Allergy status to other drugs, medicaments and biological substances status: Secondary | ICD-10-CM

## 2017-06-03 HISTORY — DX: Cannabis use, unspecified, uncomplicated: F12.90

## 2017-06-03 LAB — CBC WITH DIFFERENTIAL/PLATELET
Basophils Absolute: 0 10*3/uL (ref 0.0–0.1)
Basophils Relative: 0 %
EOS ABS: 0 10*3/uL (ref 0.0–0.7)
EOS PCT: 0 %
HCT: 39.3 % (ref 36.0–46.0)
Hemoglobin: 13.5 g/dL (ref 12.0–15.0)
LYMPHS ABS: 1.6 10*3/uL (ref 0.7–4.0)
Lymphocytes Relative: 16 %
MCH: 30.1 pg (ref 26.0–34.0)
MCHC: 34.4 g/dL (ref 30.0–36.0)
MCV: 87.5 fL (ref 78.0–100.0)
MONO ABS: 0.7 10*3/uL (ref 0.1–1.0)
MONOS PCT: 7 %
Neutro Abs: 7.7 10*3/uL (ref 1.7–7.7)
Neutrophils Relative %: 77 %
PLATELETS: 238 10*3/uL (ref 150–400)
RBC: 4.49 MIL/uL (ref 3.87–5.11)
RDW: 12.6 % (ref 11.5–15.5)
WBC: 10 10*3/uL (ref 4.0–10.5)

## 2017-06-03 LAB — CORTISOL-AM, BLOOD: CORTISOL - AM: 37.4 ug/dL — AB (ref 6.7–22.6)

## 2017-06-03 LAB — RAPID URINE DRUG SCREEN, HOSP PERFORMED
Amphetamines: NOT DETECTED
Barbiturates: NOT DETECTED
Benzodiazepines: NOT DETECTED
Cocaine: NOT DETECTED
Opiates: NOT DETECTED
Tetrahydrocannabinol: POSITIVE — AB

## 2017-06-03 LAB — COMPREHENSIVE METABOLIC PANEL
ALK PHOS: 55 U/L (ref 38–126)
ALT: 19 U/L (ref 14–54)
AST: 44 U/L — ABNORMAL HIGH (ref 15–41)
Albumin: 4.3 g/dL (ref 3.5–5.0)
Anion gap: 16 — ABNORMAL HIGH (ref 5–15)
BUN: 12 mg/dL (ref 6–20)
CALCIUM: 9.4 mg/dL (ref 8.9–10.3)
CO2: 19 mmol/L — ABNORMAL LOW (ref 22–32)
CREATININE: 0.88 mg/dL (ref 0.44–1.00)
Chloride: 107 mmol/L (ref 101–111)
GFR calc non Af Amer: >60 mL/min (ref >60)
GLUCOSE: 102 mg/dL — AB (ref 65–99)
Potassium: 2.7 mmol/L — CL (ref 3.5–5.1)
SODIUM: 142 mmol/L (ref 135–145)
Total Bilirubin: 1 mg/dL (ref 0.3–1.2)
Total Protein: 7.5 g/dL (ref 6.5–8.1)

## 2017-06-03 LAB — URINALYSIS, ROUTINE W REFLEX MICROSCOPIC
Bilirubin Urine: NEGATIVE
Glucose, UA: NEGATIVE mg/dL
Hgb urine dipstick: NEGATIVE
Ketones, ur: 20 mg/dL — AB
Leukocytes, UA: NEGATIVE
Nitrite: NEGATIVE
PROTEIN: NEGATIVE mg/dL
SPECIFIC GRAVITY, URINE: 1.012 (ref 1.005–1.030)
pH: 6 (ref 5.0–8.0)

## 2017-06-03 LAB — MAGNESIUM: MAGNESIUM: 1.7 mg/dL (ref 1.7–2.4)

## 2017-06-03 LAB — LIPASE, BLOOD: Lipase: 35 U/L (ref 11–51)

## 2017-06-03 LAB — TROPONIN I
TROPONIN I: 0.06 ng/mL — AB (ref <0.03)
Troponin I: 0.03 ng/mL (ref <0.03)

## 2017-06-03 LAB — INFLUENZA PANEL BY PCR (TYPE A & B)
Influenza A By PCR: NEGATIVE
Influenza B By PCR: NEGATIVE

## 2017-06-03 MED ORDER — PROMETHAZINE HCL 25 MG/ML IJ SOLN
12.5000 mg | Freq: Four times a day (QID) | INTRAMUSCULAR | Status: DC | PRN
  Administered 2017-06-03 – 2017-06-05 (×7): 12.5 mg via INTRAVENOUS
  Filled 2017-06-03 (×8): qty 1

## 2017-06-03 MED ORDER — ONDANSETRON HCL 4 MG PO TABS: 4.0000 mg | ORAL_TABLET | Freq: Four times a day (QID) | ORAL | Status: DC | PRN

## 2017-06-03 MED ORDER — DIPHENHYDRAMINE HCL 50 MG/ML IJ SOLN: 50.0000 mg | Freq: Once | INTRAMUSCULAR | Status: DC

## 2017-06-03 MED ORDER — HYDROCODONE-ACETAMINOPHEN 5-325 MG PO TABS
1.0000 | ORAL_TABLET | ORAL | Status: DC | PRN
  Administered 2017-06-03: 1 via ORAL
  Filled 2017-06-03: qty 1

## 2017-06-03 MED ORDER — HYDROCORTISONE NA SUCCINATE PF 100 MG IJ SOLR: 50.0000 mg | Freq: Every day | INTRAMUSCULAR | Status: DC

## 2017-06-03 MED ORDER — FENTANYL CITRATE (PF) 100 MCG/2ML IJ SOLN
50.0000 ug | INTRAMUSCULAR | Status: DC | PRN
Start: 2017-06-03 — End: 2017-06-03
  Administered 2017-06-03: 50 ug via INTRAVENOUS
  Filled 2017-06-03: qty 2

## 2017-06-03 MED ORDER — SODIUM CHLORIDE 0.9 % IV BOLUS (SEPSIS)
1000.0000 mL | Freq: Once | INTRAVENOUS | Status: AC
  Administered 2017-06-03: 1000 mL via INTRAVENOUS

## 2017-06-03 MED ORDER — SODIUM CHLORIDE 0.9 % IV SOLN
INTRAVENOUS | Status: DC
Start: 2017-06-03 — End: 2017-06-03
  Administered 2017-06-03: 125 mL/h via INTRAVENOUS

## 2017-06-03 MED ORDER — HYDROMORPHONE HCL 2 MG PO TABS
2.0000 mg | ORAL_TABLET | Freq: Four times a day (QID) | ORAL | Status: DC | PRN
Start: 2017-06-03 — End: 2017-06-05
  Administered 2017-06-03 – 2017-06-05 (×8): 2 mg via ORAL
  Filled 2017-06-03 (×8): qty 1

## 2017-06-03 MED ORDER — METOPROLOL TARTRATE 5 MG/5ML IV SOLN
5.0000 mg | Freq: Once | INTRAVENOUS | Status: AC
  Administered 2017-06-03: 5 mg via INTRAVENOUS
  Filled 2017-06-03: qty 5

## 2017-06-03 MED ORDER — GI COCKTAIL ~~LOC~~
30.0000 mL | Freq: Two times a day (BID) | ORAL | Status: DC | PRN
  Administered 2017-06-03 – 2017-06-05 (×2): 30 mL via ORAL
  Filled 2017-06-03 (×2): qty 30

## 2017-06-03 MED ORDER — FAMOTIDINE IN NACL 20-0.9 MG/50ML-% IV SOLN
20.0000 mg | Freq: Once | INTRAVENOUS | Status: AC
  Administered 2017-06-03: 20 mg via INTRAVENOUS
  Filled 2017-06-03: qty 50

## 2017-06-03 MED ORDER — HYDROCODONE-ACETAMINOPHEN 5-325 MG PO TABS
2.0000 | ORAL_TABLET | ORAL | Status: DC | PRN
Start: 2017-06-03 — End: 2017-06-03
  Administered 2017-06-03: 1 via ORAL
  Filled 2017-06-03 (×2): qty 2

## 2017-06-03 MED ORDER — HALOPERIDOL LACTATE 5 MG/ML IJ SOLN
5.0000 mg | Freq: Once | INTRAMUSCULAR | Status: AC
  Administered 2017-06-03: 5 mg via INTRAMUSCULAR
  Filled 2017-06-03: qty 1

## 2017-06-03 MED ORDER — ACETAMINOPHEN 325 MG PO TABS
650.0000 mg | ORAL_TABLET | Freq: Four times a day (QID) | ORAL | Status: DC | PRN
  Administered 2017-06-03 – 2017-06-05 (×2): 650 mg via ORAL
  Filled 2017-06-03 (×3): qty 2

## 2017-06-03 MED ORDER — POTASSIUM CHLORIDE 10 MEQ/100ML IV SOLN
10.0000 meq | INTRAVENOUS | Status: AC
  Administered 2017-06-03 (×2): 10 meq via INTRAVENOUS
  Filled 2017-06-03: qty 100

## 2017-06-03 MED ORDER — CLONIDINE HCL 0.2 MG PO TABS
0.2000 mg | ORAL_TABLET | Freq: Once | ORAL | Status: AC
  Administered 2017-06-03: 0.2 mg via ORAL
  Filled 2017-06-03: qty 1

## 2017-06-03 MED ORDER — LEVONORGESTREL-ETHINYL ESTRAD 0.1-20 MG-MCG PO TABS: 1.0000 | ORAL_TABLET | Freq: Every day | ORAL | Status: DC

## 2017-06-03 MED ORDER — HYDRALAZINE HCL 20 MG/ML IJ SOLN
10.0000 mg | Freq: Four times a day (QID) | INTRAMUSCULAR | Status: DC | PRN
Start: 2017-06-03 — End: 2017-06-05
  Administered 2017-06-03 – 2017-06-04 (×2): 10 mg via INTRAVENOUS
  Filled 2017-06-03 (×2): qty 1

## 2017-06-03 MED ORDER — PANTOPRAZOLE SODIUM 40 MG IV SOLR
40.0000 mg | Freq: Two times a day (BID) | INTRAVENOUS | Status: DC
Start: 2017-06-03 — End: 2017-06-04
  Administered 2017-06-03 – 2017-06-04 (×3): 40 mg via INTRAVENOUS
  Filled 2017-06-03 (×3): qty 40

## 2017-06-03 MED ORDER — ONDANSETRON HCL 4 MG/2ML IJ SOLN
4.0000 mg | Freq: Four times a day (QID) | INTRAMUSCULAR | Status: DC | PRN
  Administered 2017-06-03: 4 mg via INTRAVENOUS
  Filled 2017-06-03 (×2): qty 2

## 2017-06-03 MED ORDER — HYDRALAZINE HCL 20 MG/ML IJ SOLN
10.0000 mg | Freq: Once | INTRAMUSCULAR | Status: AC
  Administered 2017-06-03: 10 mg via INTRAVENOUS
  Filled 2017-06-03: qty 1

## 2017-06-03 MED ORDER — LORAZEPAM 1 MG PO TABS
1.0000 mg | ORAL_TABLET | Freq: Once | ORAL | Status: AC
  Administered 2017-06-03: 1 mg via ORAL
  Filled 2017-06-03: qty 1

## 2017-06-03 MED ORDER — METHYLPREDNISOLONE SODIUM SUCC 125 MG IJ SOLR
125.0000 mg | Freq: Once | INTRAMUSCULAR | Status: AC
  Administered 2017-06-03: 125 mg via INTRAVENOUS
  Filled 2017-06-03: qty 2

## 2017-06-03 MED ORDER — ENOXAPARIN SODIUM 40 MG/0.4ML ~~LOC~~ SOLN
40.0000 mg | SUBCUTANEOUS | Status: DC
  Filled 2017-06-03 (×2): qty 0.4

## 2017-06-03 MED ORDER — ACETAMINOPHEN 650 MG RE SUPP: 650.0000 mg | Freq: Four times a day (QID) | RECTAL | Status: DC | PRN

## 2017-06-03 MED ORDER — MAGNESIUM SULFATE 2 GM/50ML IV SOLN
2.0000 g | Freq: Once | INTRAVENOUS | Status: AC
  Administered 2017-06-03: 2 g via INTRAVENOUS
  Filled 2017-06-03: qty 50

## 2017-06-03 MED ORDER — POTASSIUM CHLORIDE 10 MEQ/100ML IV SOLN
10.0000 meq | INTRAVENOUS | Status: AC
  Administered 2017-06-03 (×3): 10 meq via INTRAVENOUS
  Filled 2017-06-03 (×3): qty 100

## 2017-06-03 MED ORDER — POTASSIUM CHLORIDE IN NACL 40-0.9 MEQ/L-% IV SOLN
INTRAVENOUS | Status: DC
  Administered 2017-06-03 – 2017-06-04 (×3): 100 mL/h via INTRAVENOUS
  Administered 2017-06-04 – 2017-06-05 (×2): 125 mL/h via INTRAVENOUS

## 2017-06-03 MED ORDER — POTASSIUM CHLORIDE 10 MEQ/100ML IV SOLN
INTRAVENOUS | Status: AC
  Administered 2017-06-03: 10 meq via INTRAVENOUS
  Filled 2017-06-03: qty 100

## 2017-06-03 NOTE — Progress Notes (Signed)
**Note De-identified  Obfuscation** EKG complete RN notified  and placed in patient chart.  

## 2017-06-03 NOTE — Plan of Care (Signed)
  Education: Knowledge of General Education information will improve 06/03/2017 2350 - Progressing by Cassandria Anger, RN   Health Behavior/Discharge Planning: Ability to manage health-related needs will improve 06/03/2017 2350 - Progressing by Cassandria Anger, RN   Clinical Measurements: Ability to maintain clinical measurements within normal limits will improve 06/03/2017 2350 - Progressing by Cassandria Anger, RN Diagnostic test results will improve 06/03/2017 2350 - Progressing by Cassandria Anger, RN   Activity: Risk for activity intolerance will decrease 06/03/2017 2350 - Progressing by Cassandria Anger, RN   Coping: Level of anxiety will decrease 06/03/2017 2350 - Progressing by Cassandria Anger, RN   Pain Managment: General experience of comfort will improve 06/03/2017 2350 - Progressing by Cassandria Anger, RN

## 2017-06-03 NOTE — ED Notes (Signed)
Pt continues to cry out stating she needs phenergan.  No vomiting noted.  Will continue to monitor.

## 2017-06-03 NOTE — H&P (Signed)
History and Physical  Brittany Mullins ZOX:[id number] DOB: 1980/09/28 DOA: 06/03/2017   PCP: Patient, No Pcp Per   Patient coming from: Home  Chief Complaint: vomiting, abdominal pain  HPI:  Brittany Mullins is a 37 y.o. female with medical history of Addison's disease, opioid dependence and benign brain tumor presenting with 2-1/2-week history of abdominal pain with nausea and vomiting.  Patient has also been complaining of subjective fevers and chills for the past 2-3 days.  She states that she is visiting some friends here in [place].  She previously resided in [place], but has lived in [place] state for the past few months.  The patient's history is inconsistent.  She has provided a different history for different providers.  She states that she has a history of adrenal insufficiency, but is not able to tell me the dose of prednisone over the last time she took it.  She states that she has been on methadone and Dilaudid for at least 4 years for chronic pain relating to her "brain tumor".  She states that she was diagnosed with a brain tumor many years ago, but is not able to provide any significant details regarding the type of tumor or workup.  Nevertheless, the patient visited the emergency department on 06/29/2017 for intractable vomiting and abdominal pain.  The patient was treated with IV fluids and potassium.  She was given IV Phenergan and discharged with prescriptions for Carafate and Phenergan.  She did not get this filled.  She returns  on the morning of 06/03/2017 because of continued vomiting and abdominal pain.  She states that she had vomited "3 trash cans full"of emesis on 06/02/2017, and that she has vomited numerous times in the emergency department prior to my evaluation.  However, in review of the medical record and speaking with Dr. Thurnell Garbe, the patient has not had any emesis during her time in the emergency department today or yesterday.  In the emergency department,  the patient was afebrile hemodynamically stable saturating 100% on room air.  The patient was given Solu-Medrol, IV fentanyl, IV potassium x2, magnesium, Haldol, but she refused Benadryl.    Assessment/Plan: Dehydration -Secondary to GI losses -Continue IV fluids with potassium  Intractable vomiting/abdominal pain -Abdominal pain is out of proportion with examination findings -When patient is distracted-->no abdominal pain or guarding on exam -06/02/2017 CT abdomen with renal protocol-negative for acute findings to account for the patient's abdominal pain -Right upper quadrant ultrasound -I have asked the patient to notify nursing staff with each episode of emesis -IV antiemetics -N.p.o. except for ice chips -CT brain -Continue IV steroids for now -Check a.m. Cortisol -Lipase 44 -RUQ ultrasound -UA neg for pyuria -suspect cyclic vomiting syndrome from [facility] -vital signs do not suggest withdrawal  Chronic pain syndrome -The patient has given different accounts of her chronic opioid use -She states that she has been on methadone 10 mg 4 times daily for the past 4-5 years, but she is unable to provide any documentation whatsoever -avoid IV opioids if possible -po norco for now [facility] has been queried for this patient--had one Rx for hydrocodone in [place] on 05/29/17  -the patient has opioid seeking behavior  Hypokalemia -Replete -Magnesium 1.72 g given in the emergency department-->  Addison's disease -Check a.m. Cortisol--please note the patient did receive Solu-Medrol in the emergency department -CT brain  Transaminasemia -RUQ ultrasound -hep B surface antigen -hep C antibody  Questionable chronic pancreatitis -No  radiographic evidence to suggest chronic pancreatitis -Lipase 44     Past Medical History:  Diagnosis Date  . Addison's disease ([place])   . Brain tumor (benign) ([place])   . Marijuana use    Per pt: "medical  marijuana patient"  . Pancreatitis   . Pituitary tumor    Past Surgical History:  Procedure Laterality Date  . ABDOMINAL HYSTERECTOMY    . CHOLECYSTECTOMY    . ELBOW SURGERY    . kidney stent    . pancreatic stent     Social History:  reports that  has never smoked. she has never used smokeless tobacco. She reports that she does not drink alcohol or use drugs.   History reviewed. No pertinent family history.   Allergies  Allergen Reactions  . Bee Venom Anaphylaxis  . Toradol [Ketorolac Tromethamine] Shortness Of Breath  . Benadryl [Diphenhydramine] Other (See Comments)    States she cannot breathe     Prior to Admission medications   Medication Sig Start Date End Date Taking? Authorizing Provider  acetaminophen (TYLENOL) 500 MG tablet Take 1,000 mg by mouth every 6 (six) hours as needed for headache.    [provider]  LESSINA-28 0.1-20 MG-MCG tablet Take 1 tablet by mouth daily. 05/02/17   [provider]  ofloxacin (OCUFLOX) 0.3 % ophthalmic solution Place 5 drops into both ears 2 (two) times daily. 05/30/17   [provider]  omeprazole (PRILOSEC) 40 MG capsule Take 1 capsule (40 mg total) by mouth daily. 06/02/17   Julianne Rice, MD  ondansetron (ZOFRAN-ODT) 4 MG disintegrating tablet Take 1 tablet by mouth every 6 (six) hours as needed. 05/29/17   [provider]  potassium chloride SA (K-DUR,KLOR-CON) 20 MEQ tablet Take 1 tablet by mouth daily. 06/01/17   [provider]  promethazine (PHENERGAN) 25 MG suppository Place 1 suppository (25 mg total) rectally every 6 (six) hours as needed for nausea or vomiting. 06/02/17   Julianne Rice, MD  promethazine (PHENERGAN) 25 MG tablet Take 1 tablet by mouth 3 (three) times daily as needed. 06/01/17   [provider]  sucralfate (CARAFATE) 1 g tablet Take 1 tablet (1 g total) by mouth 4 (four) times daily -  with meals and at bedtime. 06/02/17   Julianne Rice, MD    Review of  Systems:  Constitutional:  No weight loss, night sweats, Fevers, chills, fatigue.  Head&Eyes: No headache.  No vision loss.  No eye pain or scotoma ENT:  No Difficulty swallowing,Tooth/dental problems,Sore throat,  No ear ache, post nasal drip,  Cardio-vascular:  No chest pain, Orthopnea, PND, swelling in lower extremities,  dizziness, palpitations  GI:  No  abdominal pain, nausea, vomiting, diarrhea, loss of appetite, hematochezia, melena, heartburn, indigestion, Resp:  No shortness of breath with exertion or at rest. No cough. No coughing up of blood .No wheezing.No chest wall deformity  Skin:  no rash or lesions.  GU:  no dysuria, change in color of urine, no urgency or frequency. No flank pain.  Musculoskeletal:  No joint pain or swelling. No decreased range of motion. No back pain.  Psych:  No change in mood or affect. No depression or anxiety. Neurologic: No headache, no dysesthesia, no focal weakness, no vision loss. No syncope  Physical Exam: Vitals:   06/03/17 0632  BP: (!) 136/105  Pulse: 88  Resp: 18  Temp: 98.1 F (36.7 C)  TempSrc: Oral  SpO2: 100%  Weight: 77.1 kg (170 lb)  Height: 5\' 3"  (1.6  m)   General:  A&O x 3, NAD, nontoxic, pleasant/cooperative Head/Eye: No conjunctival hemorrhage, no icterus, [place]/AT, No nystagmus ENT:  No icterus,  No thrush, good dentition, no pharyngeal exudate Neck:  No masses, no lymphadenpathy, no bruits CV:  RRR, no rub, no gallop, no S3 Lung:  CTAB, good air movement, no wheeze, no rhonchi Abdomen: soft/NT, +BS, nondistended, no peritoneal signs Ext: No cyanosis, No rashes, No petechiae, No lymphangitis, No edema Neuro: CNII-XII intact, strength 4/5 in bilateral upper and lower extremities, no dysmetria  Labs on Admission:  Basic Metabolic Panel: Recent Labs  Lab 06/02/17 1639 06/03/17 0720  NA 141 142  K 2.7* 2.7*  CL 102 107  CO2 23 19*  GLUCOSE 95 102*  BUN 17 12  CREATININE 1.71* 0.88  CALCIUM 9.6 9.4  MG   --  1.7   Liver Function Tests: Recent Labs  Lab 06/02/17 1639 06/03/17 0720  AST 37 44*  ALT 19 19  ALKPHOS 56 55  BILITOT 0.9 1.0  PROT 7.9 7.5  ALBUMIN 4.7 4.3   Recent Labs  Lab 06/02/17 1639 06/03/17 0720  LIPASE 36 35   No results for input(s): AMMONIA in the last 168 hours. CBC: Recent Labs  Lab 06/02/17 1639 06/03/17 0720  WBC 12.6* 10.0  NEUTROABS  --  7.7  HGB 14.4 13.5  HCT 42.4 39.3  MCV 88.1 87.5  PLT 250 238   Coagulation Profile: No results for input(s): INR, PROTIME in the last 168 hours. Cardiac Enzymes: No results for input(s): CKTOTAL, CKMB, CKMBINDEX, TROPONINI in the last 168 hours. BNP: Invalid input(s): POCBNP CBG: No results for input(s): GLUCAP in the last 168 hours. Urine analysis:    Component Value Date/Time   COLORURINE STRAW (A) 06/03/2017 0721   APPEARANCEUR CLEAR 06/03/2017 0721   LABSPEC 1.012 06/03/2017 0721   PHURINE 6.0 06/03/2017 0721   GLUCOSEU NEGATIVE 06/03/2017 0721   HGBUR NEGATIVE 06/03/2017 0721   BILIRUBINUR NEGATIVE 06/03/2017 0721   KETONESUR 20 (A) 06/03/2017 0721   PROTEINUR NEGATIVE 06/03/2017 0721   NITRITE NEGATIVE 06/03/2017 0721   LEUKOCYTESUR NEGATIVE 06/03/2017 0721   Sepsis Labs: @LABRCNTIP (procalcitonin:4,lacticidven:4) )No results found for this or any previous visit (from the past 240 hour(s)).   Radiological Exams on Admission: Ct Renal Stone Study  Result Date: 06/02/2017 CLINICAL DATA:  37 year old female with history of abdominal pain and vomiting for the past 2 and half weeks. Cough a ground emesis. Fever and sore throat since yesterday. EXAM: CT ABDOMEN AND PELVIS WITHOUT CONTRAST TECHNIQUE: Multidetector CT imaging of the abdomen and pelvis was performed following the standard protocol without IV contrast. COMPARISON:  No priors. FINDINGS: Lower chest: Unremarkable. Hepatobiliary: No definite cystic or solid hepatic lesions are confidently identified on today's noncontrast CT examination.  Status post cholecystectomy. Pancreas: No definite pancreatic mass or peripancreatic fluid or inflammatory changes are noted on today's noncontrast CT examination. Spleen: Numerous calcified granulomas in the spleen. Adrenals/Urinary Tract: Multiple small nonobstructive calculi are noted within the collecting systems of both kidneys measuring up to 5 mm in the upper pole collecting system of the left kidney. No additional calculi are noted along the course of either ureter or within the lumen of the urinary bladder. No hydroureteronephrosis. Unenhanced appearance of the urinary bladder is normal. Bilateral adrenal glands are normal in appearance. Stomach/Bowel: Unenhanced appearance of the stomach is normal. There is no pathologic dilatation of small bowel or colon. The appendix is not confidently identified and may be surgically absent. Regardless,  there are no inflammatory changes noted adjacent to the cecum to suggest the presence of an acute appendicitis at this time. Vascular/Lymphatic: No atherosclerotic calcifications or definite aneurysm identified in the abdominal or pelvic vasculature. No lymphadenopathy noted in the abdomen or pelvis. Reproductive: Status post hysterectomy. Ovaries are not confidently identified may be surgically absent or atrophic. Other: No significant volume of ascites.  No pneumoperitoneum. Musculoskeletal: There are no aggressive appearing lytic or blastic lesions noted in the visualized portions of the skeleton. IMPRESSION: 1. No acute findings are noted in the abdomen or pelvis to account for the patient's symptoms. 2. Several nonobstructive calculi are noted within both renal collecting systems. No ureteral stones or findings of urinary tract obstruction are noted at this time. 3. Old granulomatous disease in the spleen. 4. Status post cholecystectomy. Electronically Signed   By: Vinnie Langton M.D.   On: 06/02/2017 17:56        Time spent:60 minutes Code Status:    FULL Family Communication:  No Family at bedside Disposition Plan: expect 1-2 day hospitalization Consults called: none DVT Prophylaxis: Two Strike Lovenox  Orson Eva, DO  [facility] Pager [phone]  If 7PM-7AM, please contact night-coverage [email or web address] Password [email or web address] 06/03/2017, 9:15 AM

## 2017-06-03 NOTE — ED Provider Notes (Signed)
[facility] EMERGENCY DEPARTMENT Provider Note   CSN: [id number] Arrival date & time: 06/03/17  4268     History   Chief Complaint Chief Complaint  Patient presents with  . Emesis    HPI Brittany Mullins is a 37 y.o. female.  HPI  Pt was seen at [place].  Per pt, c/o gradual onset and persistence of constant generalized abd "pain" for the past 2.5 weeks.  Has been associated with multiple intermittent episodes of N/V.  Describes the abd pain as "aching." Describes the emesis as "coffee grounds." Has been associated with subjective fevers/chills and decreased urination. States she has hx of Addison's disease but has not been taking her steroid "in at last the past month."  Denies diarrhea, no objective fevers, no back pain, no rash, no CP/SOB, no black or blood in stools or emesis. Pt was evaluated in the ED yesterday for her symptoms, with reassuring CT A/P, heme negative stool, given IVF, potassium, phenergan/zofran/dilaudid/carafate/protonix, and d/c home. Pt states she did not get the phenergan rx filled, so she has continued to have N/V since being d/c from the ED last night.      Past Medical History:  Diagnosis Date  . Addison's disease ([place])   . Brain tumor (benign) ([place])   . Pancreatitis   . Pituitary tumor     There are no active problems to display for this patient.   Past Surgical History:  Procedure Laterality Date  . ABDOMINAL HYSTERECTOMY    . CHOLECYSTECTOMY    . ELBOW SURGERY    . kidney stent    . pancreatic stent      OB History    No data available       Home Medications    Prior to Admission medications   Medication Sig Start Date End Date Taking? Authorizing Provider  acetaminophen (TYLENOL) 500 MG tablet Take 1,000 mg by mouth every 6 (six) hours as needed for headache.    [provider]  LESSINA-28 0.1-20 MG-MCG tablet Take 1 tablet by mouth daily. 05/02/17   [provider]  ofloxacin (OCUFLOX) 0.3 % ophthalmic solution  Place 5 drops into both ears 2 (two) times daily. 05/30/17   [provider]  omeprazole (PRILOSEC) 40 MG capsule Take 1 capsule (40 mg total) by mouth daily. 06/02/17   Julianne Rice, MD  ondansetron (ZOFRAN-ODT) 4 MG disintegrating tablet Take 1 tablet by mouth every 6 (six) hours as needed. 05/29/17   [provider]  potassium chloride SA (K-DUR,KLOR-CON) 20 MEQ tablet Take 1 tablet by mouth daily. 06/01/17   [provider]  promethazine (PHENERGAN) 25 MG suppository Place 1 suppository (25 mg total) rectally every 6 (six) hours as needed for nausea or vomiting. 06/02/17   Julianne Rice, MD  promethazine (PHENERGAN) 25 MG tablet Take 1 tablet by mouth 3 (three) times daily as needed. 06/01/17   [provider]  sucralfate (CARAFATE) 1 g tablet Take 1 tablet (1 g total) by mouth 4 (four) times daily -  with meals and at bedtime. 06/02/17   Julianne Rice, MD    Family History History reviewed. No pertinent family history.  Social History Social History   Tobacco Use  . Smoking status: Never Smoker  . Smokeless tobacco: Never Used  Substance Use Topics  . Alcohol use: No    Frequency: Never  . Drug use: No     Allergies   Bee venom; Toradol [ketorolac tromethamine]; and Benadryl [diphenhydramine]   Review of Systems  Review of Systems  ROS: Statement: All systems negative except as marked or noted in the HPI; Constitutional: Negative for objective fever and +subejctive chills. ; ; Eyes: Negative for eye pain, redness and discharge. ; ; ENMT: Negative for ear pain, hoarseness, nasal congestion, sinus pressure and sore throat. ; ; Cardiovascular: Negative for chest pain, palpitations, diaphoresis, dyspnea and peripheral edema. ; ; Respiratory: Negative for cough, wheezing and stridor. ; ; Gastrointestinal: +N/V, abd pain. Negative for diarrhea, blood in stool, hematemesis, jaundice and rectal bleeding. . ; ; Genitourinary: +decreased urination. Negative  for dysuria, flank pain and hematuria. ; ; Musculoskeletal: Negative for back pain and neck pain. Negative for swelling and trauma.; ; Skin: Negative for pruritus, rash, abrasions, blisters, bruising and skin lesion.; ; Neuro: Negative for headache, lightheadedness and neck stiffness. Negative for weakness, altered level of consciousness, altered mental status, extremity weakness, paresthesias, involuntary movement, seizure and syncope.        Physical Exam Updated Vital Signs BP (!) 136/105 (BP Location: Right Arm)   Pulse 88   Temp 98.1 F (36.7 C) (Oral)   Resp 18   Ht 5\' 3"  (1.6 m)   Wt 77.1 kg (170 lb)   SpO2 100%   BMI 30.11 kg/m   Physical Exam 0720: Physical examination:  Nursing notes reviewed; Vital signs and O2 SAT reviewed;  Constitutional: Well developed, Well nourished, Rolling around the floor and stretcher. Moaning loudly and forcefully making loud retching noises into an emesis bag that is heard down the hallway.; Head:  Normocephalic, atraumatic; Eyes: EOMI, PERRL, No scleral icterus; ENMT: Mouth and pharynx normal, Mucous membranes moist; Neck: Supple, Full range of motion, No lymphadenopathy; Cardiovascular: Regular rate and rhythm, No gallop; Respiratory: Breath sounds clear & equal bilaterally, No wheezes. Speaking full sentences with ease, Normal respiratory effort/excursion; Chest: Nontender, Movement normal; Abdomen: Soft, +diffuse tenderness to palp. Nondistended, Normal bowel sounds; Genitourinary: No CVA tenderness; Extremities: Pulses normal, No tenderness, No edema, No calf edema or asymmetry.; Neuro: AA&Ox3, Major CN grossly intact.  Speech clear. No gross focal motor or sensory deficits in extremities.; Skin: Color normal, Warm, Dry.; Psych:  Histrionic.     ED Treatments / Results  Labs (all labs ordered are listed, but only abnormal results are displayed)   EKG  EKG Interpretation None       Radiology   Procedures Procedures (including  critical care time)  Medications Ordered in ED Medications  diphenhydrAMINE (BENADRYL) injection 50 mg (50 mg Intravenous Refused 06/03/17 0733)  famotidine (PEPCID) IVPB 20 mg premix (20 mg Intravenous New Bag/Given 06/03/17 0733)  fentaNYL (SUBLIMAZE) injection 50 mcg (not administered)  sodium chloride 0.9 % bolus 1,000 mL (1,000 mLs Intravenous New Bag/Given 06/03/17 0734)  methylPREDNISolone sodium succinate (SOLU-MEDROL) 125 mg/2 mL injection 125 mg (125 mg Intravenous Given 06/03/17 0733)  haloperidol lactate (HALDOL) injection 5 mg (5 mg Intramuscular Given 06/03/17 0733)     Initial Impression / Assessment and Plan / ED Course  I have reviewed the triage vital signs and the nursing notes.  Pertinent labs & imaging results that were available during my care of the patient were reviewed by me and considered in my medical decision making (see chart for details).  MDM Reviewed: previous chart, nursing note and vitals Reviewed previous: CT scan and labs Interpretation: labs   Results for orders placed or performed during the hospital encounter of 06/03/17  Comprehensive metabolic panel  Result Value Ref Range   Sodium 142 135 - 145 mmol/L  Potassium 2.7 (LL) 3.5 - 5.1 mmol/L   Chloride 107 101 - 111 mmol/L   CO2 19 (L) 22 - 32 mmol/L   Glucose, Bld 102 (H) 65 - 99 mg/dL   BUN 12 6 - 20 mg/dL   Creatinine, Ser 0.88 0.44 - 1.00 mg/dL   Calcium 9.4 8.9 - 10.3 mg/dL   Total Protein 7.5 6.5 - 8.1 g/dL   Albumin 4.3 3.5 - 5.0 g/dL   AST 44 (H) 15 - 41 U/L   ALT 19 14 - 54 U/L   Alkaline Phosphatase 55 38 - 126 U/L   Total Bilirubin 1.0 0.3 - 1.2 mg/dL   GFR calc non Af Amer >60 >60 mL/min   GFR calc Af Amer >60 >60 mL/min   Anion gap 16 (H) 5 - 15  Lipase, blood  Result Value Ref Range   Lipase 35 11 - 51 U/L  CBC with Differential  Result Value Ref Range   WBC 10.0 4.0 - 10.5 K/uL   RBC 4.49 3.87 - 5.11 MIL/uL   Hemoglobin 13.5 12.0 - 15.0 g/dL   HCT 39.3 36.0 - 46.0 %    MCV 87.5 78.0 - 100.0 fL   MCH 30.1 26.0 - 34.0 pg   MCHC 34.4 30.0 - 36.0 g/dL   RDW 12.6 11.5 - 15.5 %   Platelets 238 150 - 400 K/uL   Neutrophils Relative % 77 %   Neutro Abs 7.7 1.7 - 7.7 K/uL   Lymphocytes Relative 16 %   Lymphs Abs 1.6 0.7 - 4.0 K/uL   Monocytes Relative 7 %   Monocytes Absolute 0.7 0.1 - 1.0 K/uL   Eosinophils Relative 0 %   Eosinophils Absolute 0.0 0.0 - 0.7 K/uL   Basophils Relative 0 %   Basophils Absolute 0.0 0.0 - 0.1 K/uL  Magnesium  Result Value Ref Range   Magnesium 1.7 1.7 - 2.4 mg/dL  Urine rapid drug screen (hosp performed)  Result Value Ref Range   Opiates NONE DETECTED NONE DETECTED   Cocaine NONE DETECTED NONE DETECTED   Benzodiazepines NONE DETECTED NONE DETECTED   Amphetamines NONE DETECTED NONE DETECTED   Tetrahydrocannabinol POSITIVE (A) NONE DETECTED   Barbiturates NONE DETECTED NONE DETECTED  Urinalysis, Routine w reflex microscopic  Result Value Ref Range   Color, Urine STRAW (A) YELLOW   APPearance CLEAR CLEAR   Specific Gravity, Urine 1.012 1.005 - 1.030   pH 6.0 5.0 - 8.0   Glucose, UA NEGATIVE NEGATIVE mg/dL   Hgb urine dipstick NEGATIVE NEGATIVE   Bilirubin Urine NEGATIVE NEGATIVE   Ketones, ur 20 (A) NEGATIVE mg/dL   Protein, ur NEGATIVE NEGATIVE mg/dL   Nitrite NEGATIVE NEGATIVE   Leukocytes, UA NEGATIVE NEGATIVE   Ct Renal Stone Study Result Date: 06/02/2017 CLINICAL DATA:  37 year old female with history of abdominal pain and vomiting for the past 2 and half weeks. Cough a ground emesis. Fever and sore throat since yesterday. EXAM: CT ABDOMEN AND PELVIS WITHOUT CONTRAST TECHNIQUE: Multidetector CT imaging of the abdomen and pelvis was performed following the standard protocol without IV contrast. COMPARISON:  No priors. FINDINGS: Lower chest: Unremarkable. Hepatobiliary: No definite cystic or solid hepatic lesions are confidently identified on today's noncontrast CT examination. Status post cholecystectomy. Pancreas: No  definite pancreatic mass or peripancreatic fluid or inflammatory changes are noted on today's noncontrast CT examination. Spleen: Numerous calcified granulomas in the spleen. Adrenals/Urinary Tract: Multiple small nonobstructive calculi are noted within the collecting systems of both kidneys measuring up  to 5 mm in the upper pole collecting system of the left kidney. No additional calculi are noted along the course of either ureter or within the lumen of the urinary bladder. No hydroureteronephrosis. Unenhanced appearance of the urinary bladder is normal. Bilateral adrenal glands are normal in appearance. Stomach/Bowel: Unenhanced appearance of the stomach is normal. There is no pathologic dilatation of small bowel or colon. The appendix is not confidently identified and may be surgically absent. Regardless, there are no inflammatory changes noted adjacent to the cecum to suggest the presence of an acute appendicitis at this time. Vascular/Lymphatic: No atherosclerotic calcifications or definite aneurysm identified in the abdominal or pelvic vasculature. No lymphadenopathy noted in the abdomen or pelvis. Reproductive: Status post hysterectomy. Ovaries are not confidently identified may be surgically absent or atrophic. Other: No significant volume of ascites.  No pneumoperitoneum. Musculoskeletal: There are no aggressive appearing lytic or blastic lesions noted in the visualized portions of the skeleton. IMPRESSION: 1. No acute findings are noted in the abdomen or pelvis to account for the patient's symptoms. 2. Several nonobstructive calculi are noted within both renal collecting systems. No ureteral stones or findings of urinary tract obstruction are noted at this time. 3. Old granulomatous disease in the spleen. 4. Status post cholecystectomy. Electronically Signed   By: Vinnie Langton M.D.   On: 06/02/2017 17:56     0720:  Pt with histrionic behavior from waiting room and into ED exam room. Pt rolling  around on the floor, then rolling around on the ED stretcher with face buried in a pillow. Will minimally answer only some questions. Loudly forcefully dry heaving into an emesis bag (no actual emesis) that can be heard down the hallway with her exam room door closed. [place] Controlled Substance Database accessed: Pt filled hydrocodone 5mg /APAP 325mg  tabs, #4, on 05/29/2017 in [place]; and filled methadone 10mg  #120, and hydromorphone 2mg , #90, on 04/22/2017 in [place] (rx were written by provider in [facility]). Pt queried regarding this information: pt was initially not forthcoming regarding these rx, then only the methadone, but finally admitted to both rx. Pt then quickly stated that she "isn't in withdrawal! This is something else!" Pt cannot tell me any information regarding her Addison's disease, ie: what steroid she took and dose, how long she took it for, when the last time she took it. Pt not hypotensive, but does c/o N/V and abd pain and had elevated BUN/Cr on previous labs.  Will dose IV solumedrol and workup today progresses.   0815:  Pt refused benadryl, states she has "an allergy to it." Pt also refuses to give urine sample and urinated on herself. Pt queried regarding marijuana use: pt states "of course" and "I'm a medical marijuana patient." Hypokalemia continues, magnesium also low. Pt refuses PO. Will continue IVF, IV electrolyte repletion and admit.   0845:  T/C returned from [facility] Dr. Carles Collet, case discussed, including:  HPI, pertinent PM/SHx, VS/PE, dx testing, ED course and treatment:  Agreeable to admit.       Final Clinical Impressions(s) / ED Diagnoses   Final diagnoses:  None    ED Discharge Orders    None       Francine Graven, DO 06/06/17 2134

## 2017-06-03 NOTE — Progress Notes (Signed)
Critical Troponin called in from lab 0.06. Mid level provider notified. Patient denies jaw and arm pain. She is having some nausea; that has been an issue since admission.  Verbalizes that she is having blurred vision and head and neck discomfort. Blood pressure remains elevated 194/110 manual and heart rate apical decreased to 105. Patient is resting in bed in no visual acute distress. She is asleep when this nurse enters room.

## 2017-06-03 NOTE — ED Triage Notes (Signed)
PT reports nausea and vomiting for 2 weeks.  Seen last night for same.  Given Rx for phenergan suppositories but has not taken any.

## 2017-06-03 NOTE — ED Notes (Signed)
Pt states she is allergic to Benadryl and cannot take it.  Added to allergy list.  Pt is lying with her legs thrown over the side railing with her buttocks wedged between the mattress and rail.  Advised to get back into bed and put her legs down.  Pt began screaming "you just don't understand."

## 2017-06-03 NOTE — ED Notes (Signed)
Pt refused in and out cath and then urinated on self.  Provided with sanitary pad and clean underwear.

## 2017-06-03 NOTE — ED Notes (Signed)
Pt presents with constant nausea and dry heaves.  Pt rolling around in floor in waiting area and will only answer minimal questions.

## 2017-06-03 NOTE — ED Notes (Signed)
Date and time results received: 06/03/17 8:06 AM  (use smartphrase ".now" to insert current time)  Test: K+ Critical Value: 2.7  Name of Provider Notified: Thurnell Garbe  Orders Received? Or Actions Taken?: Orders Received - See Orders for details

## 2017-06-03 NOTE — Progress Notes (Signed)
CRITICAL VALUE ALERT  Critical Value:  Troponin 0.03  Date & Time Notied:  06/03/2017 at [place]  Provider Notified: Benito Mccreedy, MD  Orders Received/Actions taken: [facility]

## 2017-06-04 ENCOUNTER — Observation Stay (HOSPITAL_COMMUNITY): Payer: Medicare Other

## 2017-06-04 DIAGNOSIS — R072 Precordial pain: Secondary | ICD-10-CM

## 2017-06-04 DIAGNOSIS — F19939 Other psychoactive substance use, unspecified with withdrawal, unspecified: Secondary | ICD-10-CM | POA: Diagnosis present

## 2017-06-04 DIAGNOSIS — Z765 Malingerer [conscious simulation]: Secondary | ICD-10-CM | POA: Diagnosis not present

## 2017-06-04 DIAGNOSIS — E860 Dehydration: Secondary | ICD-10-CM | POA: Diagnosis present

## 2017-06-04 DIAGNOSIS — R079 Chest pain, unspecified: Secondary | ICD-10-CM

## 2017-06-04 DIAGNOSIS — R1084 Generalized abdominal pain: Secondary | ICD-10-CM | POA: Diagnosis not present

## 2017-06-04 DIAGNOSIS — G894 Chronic pain syndrome: Secondary | ICD-10-CM | POA: Diagnosis present

## 2017-06-04 DIAGNOSIS — R740 Nonspecific elevation of levels of transaminase and lactic acid dehydrogenase [LDH]: Secondary | ICD-10-CM | POA: Diagnosis present

## 2017-06-04 DIAGNOSIS — Z885 Allergy status to narcotic agent status: Secondary | ICD-10-CM | POA: Diagnosis not present

## 2017-06-04 DIAGNOSIS — I10 Essential (primary) hypertension: Secondary | ICD-10-CM | POA: Diagnosis present

## 2017-06-04 DIAGNOSIS — F1290 Cannabis use, unspecified, uncomplicated: Secondary | ICD-10-CM | POA: Diagnosis present

## 2017-06-04 DIAGNOSIS — Z86011 Personal history of benign neoplasm of the brain: Secondary | ICD-10-CM | POA: Diagnosis not present

## 2017-06-04 DIAGNOSIS — Z91030 Bee allergy status: Secondary | ICD-10-CM | POA: Diagnosis not present

## 2017-06-04 DIAGNOSIS — Z79899 Other long term (current) drug therapy: Secondary | ICD-10-CM | POA: Diagnosis not present

## 2017-06-04 DIAGNOSIS — Y92009 Unspecified place in unspecified non-institutional (private) residence as the place of occurrence of the external cause: Secondary | ICD-10-CM | POA: Diagnosis not present

## 2017-06-04 DIAGNOSIS — Z9049 Acquired absence of other specified parts of digestive tract: Secondary | ICD-10-CM | POA: Diagnosis not present

## 2017-06-04 DIAGNOSIS — Z888 Allergy status to other drugs, medicaments and biological substances status: Secondary | ICD-10-CM | POA: Diagnosis not present

## 2017-06-04 DIAGNOSIS — G43A1 Cyclical vomiting, intractable: Secondary | ICD-10-CM | POA: Diagnosis not present

## 2017-06-04 DIAGNOSIS — E271 Primary adrenocortical insufficiency: Secondary | ICD-10-CM | POA: Diagnosis present

## 2017-06-04 DIAGNOSIS — F1120 Opioid dependence, uncomplicated: Secondary | ICD-10-CM | POA: Diagnosis present

## 2017-06-04 DIAGNOSIS — I248 Other forms of acute ischemic heart disease: Secondary | ICD-10-CM | POA: Diagnosis present

## 2017-06-04 DIAGNOSIS — G43A0 Cyclical vomiting, not intractable: Secondary | ICD-10-CM | POA: Diagnosis present

## 2017-06-04 DIAGNOSIS — R000 Tachycardia, unspecified: Secondary | ICD-10-CM | POA: Diagnosis present

## 2017-06-04 DIAGNOSIS — K861 Other chronic pancreatitis: Secondary | ICD-10-CM | POA: Diagnosis present

## 2017-06-04 DIAGNOSIS — Z8639 Personal history of other endocrine, nutritional and metabolic disease: Secondary | ICD-10-CM | POA: Diagnosis not present

## 2017-06-04 DIAGNOSIS — T407X5A Adverse effect of cannabis (derivatives), initial encounter: Secondary | ICD-10-CM | POA: Diagnosis present

## 2017-06-04 DIAGNOSIS — E876 Hypokalemia: Secondary | ICD-10-CM | POA: Diagnosis present

## 2017-06-04 LAB — COMPREHENSIVE METABOLIC PANEL
ALT: 43 U/L (ref 14–54)
ANION GAP: 13 (ref 5–15)
AST: 57 U/L — ABNORMAL HIGH (ref 15–41)
Albumin: 4.1 g/dL (ref 3.5–5.0)
Alkaline Phosphatase: 59 U/L (ref 38–126)
BUN: 7 mg/dL (ref 6–20)
CALCIUM: 9.3 mg/dL (ref 8.9–10.3)
CHLORIDE: 98 mmol/L — AB (ref 101–111)
CO2: 21 mmol/L — AB (ref 22–32)
Creatinine, Ser: 0.7 mg/dL (ref 0.44–1.00)
GFR calc non Af Amer: >60 mL/min (ref >60)
Glucose, Bld: 109 mg/dL — ABNORMAL HIGH (ref 65–99)
Potassium: 3.5 mmol/L (ref 3.5–5.1)
SODIUM: 132 mmol/L — AB (ref 135–145)
Total Bilirubin: 1.3 mg/dL — ABNORMAL HIGH (ref 0.3–1.2)
Total Protein: 7.7 g/dL (ref 6.5–8.1)

## 2017-06-04 LAB — CBC
HEMATOCRIT: 45.9 % (ref 36.0–46.0)
Hemoglobin: 15.7 g/dL — ABNORMAL HIGH (ref 12.0–15.0)
MCH: 30 pg (ref 26.0–34.0)
MCHC: 34.2 g/dL (ref 30.0–36.0)
MCV: 87.8 fL (ref 78.0–100.0)
PLATELETS: 265 10*3/uL (ref 150–400)
RBC: 5.23 MIL/uL — AB (ref 3.87–5.11)
RDW: 12.5 % (ref 11.5–15.5)
WBC: 15.2 10*3/uL — AB (ref 4.0–10.5)

## 2017-06-04 LAB — ECHOCARDIOGRAM COMPLETE
Height: 63 in
WEIGHTICAEL: 2784 [oz_av]

## 2017-06-04 LAB — HIV ANTIBODY (ROUTINE TESTING W REFLEX): HIV SCREEN 4TH GENERATION: NONREACTIVE

## 2017-06-04 LAB — HEPATITIS B SURFACE ANTIGEN: Hepatitis B Surface Ag: NEGATIVE

## 2017-06-04 LAB — MAGNESIUM: Magnesium: 1.8 mg/dL (ref 1.7–2.4)

## 2017-06-04 LAB — TSH: TSH: 0.707 u[IU]/mL (ref 0.350–4.500)

## 2017-06-04 LAB — T4, FREE: Free T4: 1.12 ng/dL (ref 0.61–1.12)

## 2017-06-04 LAB — HEPATITIS C ANTIBODY

## 2017-06-04 LAB — TROPONIN I: TROPONIN I: 0.09 ng/mL — AB (ref <0.03)

## 2017-06-04 MED ORDER — POTASSIUM CHLORIDE 10 MEQ/100ML IV SOLN
10.0000 meq | INTRAVENOUS | Status: AC
  Administered 2017-06-04 (×2): 10 meq via INTRAVENOUS
  Filled 2017-06-04 (×2): qty 100

## 2017-06-04 MED ORDER — CLONIDINE HCL 0.2 MG PO TABS
0.2000 mg | ORAL_TABLET | Freq: Three times a day (TID) | ORAL | Status: DC
  Administered 2017-06-04 – 2017-06-05 (×3): 0.2 mg via ORAL
  Filled 2017-06-04 (×3): qty 1

## 2017-06-04 MED ORDER — PANTOPRAZOLE SODIUM 40 MG PO TBEC
40.0000 mg | DELAYED_RELEASE_TABLET | Freq: Two times a day (BID) | ORAL | Status: DC
  Administered 2017-06-04 – 2017-06-05 (×2): 40 mg via ORAL
  Filled 2017-06-04 (×2): qty 1

## 2017-06-04 MED ORDER — MAGNESIUM SULFATE 2 GM/50ML IV SOLN
2.0000 g | Freq: Once | INTRAVENOUS | Status: AC
  Administered 2017-06-04: 2 g via INTRAVENOUS
  Filled 2017-06-04: qty 50

## 2017-06-04 MED ORDER — CLONIDINE HCL 0.1 MG PO TABS
0.1000 mg | ORAL_TABLET | Freq: Three times a day (TID) | ORAL | Status: DC
  Administered 2017-06-04: 0.1 mg via ORAL
  Filled 2017-06-04: qty 1

## 2017-06-04 MED ORDER — AMLODIPINE BESYLATE 5 MG PO TABS
5.0000 mg | ORAL_TABLET | Freq: Every day | ORAL | Status: DC
  Filled 2017-06-04: qty 1

## 2017-06-04 MED ORDER — IOPAMIDOL (ISOVUE-370) INJECTION 76%
100.0000 mL | Freq: Once | INTRAVENOUS | Status: AC | PRN
  Administered 2017-06-04: 100 mL via INTRAVENOUS

## 2017-06-04 MED ORDER — METOPROLOL TARTRATE 25 MG PO TABS
25.0000 mg | ORAL_TABLET | Freq: Two times a day (BID) | ORAL | Status: DC
  Administered 2017-06-04 – 2017-06-05 (×3): 25 mg via ORAL
  Filled 2017-06-04 (×3): qty 1

## 2017-06-04 NOTE — Progress Notes (Signed)
Late entry:  Dr. Carles Collet on unit and notified of patient's blood pressure and pulse on telemetry.

## 2017-06-04 NOTE — Progress Notes (Signed)
Patient remains in bed resting quietly with no voiced complaints. No acute signs and symptoms of cardiac issues observed.

## 2017-06-04 NOTE — Progress Notes (Addendum)
PROGRESS NOTE  Brittany Mullins ZSW:[id number] DOB: 1981/01/19 DOA: 06/03/2017 PCP: Patient, No Pcp Per  Brief History:  37 y.o. female with medical history of Addison's disease, opioid dependence and benign brain tumor presenting with 2-1/2-week history of abdominal pain with nausea and vomiting.  Patient has also been complaining of subjective fevers and chills for the past 2-3 days.  She states that she is visiting some friends here in [place].  She has been living in [place] state, but has been in [place] for a few weeks while her significant other looked for work.  The patient's history is inconsistent.  She has provided a different history for different providers.  She states that she has a history of adrenal insufficiency, but is not able to tell me the dose of prednisone or the last time she took it. Later she states she is not on any steroids at all.  She states that she has been on methadone and Dilaudid for at least 4 years for chronic pain relating to her "brain tumor" and chronic pancreatitis.  She states that she was diagnosed with a brain tumor many years ago, but is not able to provide any significant details regarding the type of tumor or workup.  Nevertheless, the patient visited the emergency department on 06/29/2017 for intractable vomiting and abdominal pain.  The patient was treated with IV fluids and potassium.  She was given IV Phenergan and discharged with prescriptions for Carafate and Phenergan.  She did not get this filled.  She returns  on the morning of 06/03/2017 because of continued vomiting and abdominal pain.  She states that she had vomited "3 trash cans full"of emesis on 06/02/2017, and that she has vomited numerous times in the emergency department prior to my evaluation.  Since admission, the patient's emesis has resolved.  Her diet was gradually advanced.  She continued to have abdominal pain, but it was better controlled with po  Hydromorphone.   Assessment/Plan: Dehydration -Secondary to GI losses -Continue IV fluids with potassium  Intractable vomiting/abdominal pain -Abdominal pain is out of proportion with examination findings -When patient is distracted-->no abdominal pain or guarding on exam -06/02/2017 CT abdomen with renal protocol-negative for acute findings to account for the patient's abdominal pain -Right upper quadrant ultrasound--neg  -No emesis since admission -IV antiemetics and protonix -advance diet to full liquids -CT brain--neg -received solumedrol in ED-->d/c steroids -a.m. Cortisol--37.4 -no clinical evidence of adrenal insufficiency at present -Lipase 35 -UA neg for pyuria -suspect cyclic vomiting syndrome from [facility]  Chronic pain syndrome -The patient has given different accounts of her chronic opioid use -She states that she has been on methadone 10 mg 4 times daily for the past 8 years, -avoid IV opioids -po dilaudid The [place] Controlled Substance Reporting System has been queried for this patient--had one Rx for hydrocodone in [place] on 05/29/17  -[place] narcotic database queried--last Rx for methadone 10 mg q 6, #120 filled on 04/22/17 AND dilaudid 2 mg #90 filled on 04/22/17 -I have informed patient that no opioid prescriptions will be written at time of discharge -the patient has opioid seeking behavior  Hypertension/Tachycardia -question possible withdrawal from undetectable substance or synthetic opioid -restarted po dilaudid -As the pt has been out of methadone >10 days, extremely unlikely to have methadone withdraw -CTA chest--neg for PE; no edema or infiltrates -Echo--EF 60-65%, no WMA -start clonidine and metoprolol -TSH 0.707, Free T4 1.12  Elevated troponin -demand  ischemia due to tachycardia -trend is flat -Echo-- as above -no ACS  Hypokalemia -Repleted -Magnesium 1.8  ?Addison's disease? -doubt diagnosis - a.m. Cortisol--37.4 -CT  brain  Transaminasemia -RUQ ultrasound--neg--no ductal dilatation -hep B surface antigen--neg -hep C antibody--neg -HIV neg  Questionable chronic pancreatitis -No radiographic evidence to suggest chronic pancreatitis -no biliary ductal dilatation on [place] or CT of abd -Lipase 44 -pt states she could not take Creon b/c it made her N/V and abd pain worse   Disposition Plan:   Home 3/6 if no vomiting and BP/tachycardia better controlled Family Communication:   Family at bedside  Consultants:  none  Code Status:  FULL   DVT Prophylaxis:  [place] Lovenox   Procedures: As Listed in Progress Note Above  Antibiotics: None    Subjective: Patient continues to complain of diffuse abdominal pain, chest pain.  She denies any vomiting but complains of nausea.  There is no diarrhea.  There is no hematochezia or melena.  Denies any hemoptysis.  She complains of a bifrontal headache.  There is no focal extremity weakness.  Objective: Vitals:   06/04/17 0413 06/04/17 0630 06/04/17 0832 06/04/17 0933  BP:  (!) 172/120 (!) 180/110   Pulse: 95   (!) 120  Resp:      Temp:      TempSrc:      SpO2:      Weight:      Height:        Intake/Output Summary (Last 24 hours) at 06/04/2017 1025 Last data filed at 06/03/2017 1529 Gross per 24 hour  Intake 516.67 ml  Output -  Net 516.67 ml   Weight change: 1.814 kg (4 lb) Exam:   General:  Pt is alert, follows commands appropriately, not in acute distress  HEENT: No icterus, No thrush, No neck mass, [place]/AT; no meningismus  Cardiovascular: RRR, S1/S2, no rubs, no gallops  Respiratory: CTA bilaterally, no wheezing, no crackles, no rhonchi  Abdomen: Soft/+BS, non tender, non distended, no guarding  Extremities: No edema, No lymphangitis, No petechiae, No rashes, no synovitis   Data Reviewed: I have personally reviewed following labs and imaging studies Basic Metabolic Panel: Recent Labs  Lab 06/02/17 1639 06/03/17 0720 06/04/17 0440   NA 141 142 132*  K 2.7* 2.7* 3.5  CL 102 107 98*  CO2 23 19* 21*  GLUCOSE 95 102* 109*  BUN 17 12 7   CREATININE 1.71* 0.88 0.70  CALCIUM 9.6 9.4 9.3  MG  --  1.7 1.8   Liver Function Tests: Recent Labs  Lab 06/02/17 1639 06/03/17 0720 06/04/17 0440  AST 37 44* 57*  ALT 19 19 43  ALKPHOS 56 55 59  BILITOT 0.9 1.0 1.3*  PROT 7.9 7.5 7.7  ALBUMIN 4.7 4.3 4.1   Recent Labs  Lab 06/02/17 1639 06/03/17 0720  LIPASE 36 35   No results for input(s): AMMONIA in the last 168 hours. Coagulation Profile: No results for input(s): INR, PROTIME in the last 168 hours. CBC: Recent Labs  Lab 06/02/17 1639 06/03/17 0720 06/04/17 0440  WBC 12.6* 10.0 15.2*  NEUTROABS  --  7.7  --   HGB 14.4 13.5 15.7*  HCT 42.4 39.3 45.9  MCV 88.1 87.5 87.8  PLT 250 238 265   Cardiac Enzymes: Recent Labs  Lab 06/03/17 1631 06/03/17 2223 06/04/17 0440  TROPONINI 0.03* 0.06* 0.09*   BNP: Invalid input(s): POCBNP CBG: No results for input(s): GLUCAP in the last 168 hours. HbA1C: No results for input(s): HGBA1C  in the last 72 hours. Urine analysis:    Component Value Date/Time   COLORURINE STRAW (A) 06/03/2017 0721   APPEARANCEUR CLEAR 06/03/2017 0721   LABSPEC 1.012 06/03/2017 0721   PHURINE 6.0 06/03/2017 0721   GLUCOSEU NEGATIVE 06/03/2017 0721   HGBUR NEGATIVE 06/03/2017 0721   BILIRUBINUR NEGATIVE 06/03/2017 0721   KETONESUR 20 (A) 06/03/2017 0721   PROTEINUR NEGATIVE 06/03/2017 0721   NITRITE NEGATIVE 06/03/2017 0721   LEUKOCYTESUR NEGATIVE 06/03/2017 0721   Sepsis Labs: @LABRCNTIP (procalcitonin:4,lacticidven:4) )No results found for this or any previous visit (from the past 240 hour(s)).   Scheduled Meds: . cloNIDine  0.1 mg Oral TID  . diphenhydrAMINE  50 mg Intravenous Once  . enoxaparin (LOVENOX) injection  40 mg Subcutaneous Q24H  . metoprolol tartrate  25 mg Oral BID  . pantoprazole (PROTONIX) IV  40 mg Intravenous Q12H   Continuous Infusions: . 0.9 % NaCl  with KCl 40 mEq / L 100 mL/hr (06/04/17 0553)  . magnesium sulfate 1 - 4 g bolus IVPB 2 g (06/04/17 0928)  . potassium chloride 10 mEq (06/04/17 0944)    Procedures/Studies: Dg Chest 2 View  Result Date: 06/03/2017 CLINICAL DATA:  Chest pain and vomiting today. EXAM: CHEST  2 VIEW COMPARISON:  None. FINDINGS: The heart size and mediastinal contours are within normal limits. Both lungs are clear. The visualized skeletal structures are unremarkable. IMPRESSION: No active cardiopulmonary disease. Electronically Signed   By: San Morelle M.D.   On: 06/03/2017 17:27   Ct Head Wo Contrast  Result Date: 06/03/2017 CLINICAL DATA:  Posterior headache, nausea, vomiting x3 weeks EXAM: CT HEAD WITHOUT CONTRAST TECHNIQUE: Contiguous axial images were obtained from the base of the skull through the vertex without intravenous contrast. COMPARISON:  None. FINDINGS: Brain: No evidence of acute infarction, hemorrhage, hydrocephalus, extra-axial collection or mass lesion/mass effect. Vascular: No hyperdense vessel or unexpected calcification. Skull: Normal. Negative for fracture or focal lesion. Sinuses/Orbits: No acute finding. Other: None. IMPRESSION: Negative Electronically Signed   By: Lucrezia Europe M.D.   On: 06/03/2017 11:10   Ct Renal Stone Study  Result Date: 06/02/2017 CLINICAL DATA:  37 year old female with history of abdominal pain and vomiting for the past 2 and half weeks. Cough a ground emesis. Fever and sore throat since yesterday. EXAM: CT ABDOMEN AND PELVIS WITHOUT CONTRAST TECHNIQUE: Multidetector CT imaging of the abdomen and pelvis was performed following the standard protocol without IV contrast. COMPARISON:  No priors. FINDINGS: Lower chest: Unremarkable. Hepatobiliary: No definite cystic or solid hepatic lesions are confidently identified on today's noncontrast CT examination. Status post cholecystectomy. Pancreas: No definite pancreatic mass or peripancreatic fluid or inflammatory changes are  noted on today's noncontrast CT examination. Spleen: Numerous calcified granulomas in the spleen. Adrenals/Urinary Tract: Multiple small nonobstructive calculi are noted within the collecting systems of both kidneys measuring up to 5 mm in the upper pole collecting system of the left kidney. No additional calculi are noted along the course of either ureter or within the lumen of the urinary bladder. No hydroureteronephrosis. Unenhanced appearance of the urinary bladder is normal. Bilateral adrenal glands are normal in appearance. Stomach/Bowel: Unenhanced appearance of the stomach is normal. There is no pathologic dilatation of small bowel or colon. The appendix is not confidently identified and may be surgically absent. Regardless, there are no inflammatory changes noted adjacent to the cecum to suggest the presence of an acute appendicitis at this time. Vascular/Lymphatic: No atherosclerotic calcifications or definite aneurysm identified in the abdominal or  pelvic vasculature. No lymphadenopathy noted in the abdomen or pelvis. Reproductive: Status post hysterectomy. Ovaries are not confidently identified may be surgically absent or atrophic. Other: No significant volume of ascites.  No pneumoperitoneum. Musculoskeletal: There are no aggressive appearing lytic or blastic lesions noted in the visualized portions of the skeleton. IMPRESSION: 1. No acute findings are noted in the abdomen or pelvis to account for the patient's symptoms. 2. Several nonobstructive calculi are noted within both renal collecting systems. No ureteral stones or findings of urinary tract obstruction are noted at this time. 3. Old granulomatous disease in the spleen. 4. Status post cholecystectomy. Electronically Signed   By: Vinnie Langton M.D.   On: 06/02/2017 17:56   US Abdomen Limited Ruq  Result Date: 06/03/2017 CLINICAL DATA:  Elevated LFTs, abdominal pain x2 weeks EXAM: ULTRASOUND ABDOMEN LIMITED RIGHT UPPER QUADRANT COMPARISON:   CT 06/02/2017 FINDINGS: Gallbladder: Surgically absent Common bile duct: Diameter: 4 mm, unremarkable Liver: No focal lesion identified. Within normal limits in parenchymal echogenicity. Portal vein is patent on color Doppler imaging with normal direction of blood flow towards the liver. IMPRESSION: No acute findings post cholecystectomy. Electronically Signed   By: Lucrezia Europe M.D.   On: 06/03/2017 11:07    Orson Eva, DO  [facility] Pager [phone]  If 7PM-7AM, please contact night-coverage [email or web address] Password [email or web address] 06/04/2017, 10:25 AM   LOS: 0 days

## 2017-06-04 NOTE — Progress Notes (Signed)
*  PRELIMINARY RESULTS* Echocardiogram 2D Echocardiogram has been performed.  Leavy Cella 06/04/2017, 12:03 PM

## 2017-06-04 NOTE — Progress Notes (Signed)
CRITICAL VALUE ALERT  Critical Value:  Troponin 0.09  Date & Time Notied:  06/04/2017 0940  Provider Notified: Dr. Carles Collet  Orders Received/Actions taken: Dr. Carles Collet to see patient.

## 2017-06-04 NOTE — Progress Notes (Signed)
Patient out of bed ambulating to bathroom. No further complaints of pain and discomfort. She is steady on her feet. Skin warm and dry to touch. No verbal complaints made known

## 2017-06-05 DIAGNOSIS — Z8639 Personal history of other endocrine, nutritional and metabolic disease: Secondary | ICD-10-CM

## 2017-06-05 LAB — URINE CULTURE

## 2017-06-05 MED ORDER — CLONIDINE HCL 0.2 MG PO TABS: 0.2000 mg | ORAL_TABLET | Freq: Three times a day (TID) | ORAL | 2 refills | Status: DC

## 2017-06-05 MED ORDER — METOPROLOL TARTRATE 25 MG PO TABS: 25.0000 mg | ORAL_TABLET | Freq: Two times a day (BID) | ORAL | 2 refills | Status: DC

## 2017-06-05 MED ORDER — PANTOPRAZOLE SODIUM 40 MG PO TBEC: 40.0000 mg | DELAYED_RELEASE_TABLET | Freq: Two times a day (BID) | ORAL | 2 refills | Status: DC

## 2017-06-05 MED ORDER — ONDANSETRON 4 MG PO TBDP: 4.0000 mg | ORAL_TABLET | Freq: Four times a day (QID) | ORAL | 0 refills | Status: DC | PRN

## 2017-06-05 NOTE — Progress Notes (Signed)
Patient discharged home with personal belongings. IVs removed and sites intact. Patient discharged with paperwork and prescriptions.

## 2017-06-05 NOTE — Clinical Social Work Note (Signed)
Late Entry for 06/04/17: Patient states that she and her husband have been staying with her husband's friends for a week. She stated that they will be going to live with her grandfather in [place], [place], upon discharge. LCSW provide patient with a list of homeless shelters.  Patient denied homelessness and indicated that her grandfather was agreeable to them living with him.  LCSW signing off.   Tiana Sivertson, Clydene Pugh, LCSW

## 2017-06-05 NOTE — Discharge Summary (Signed)
Physician Discharge Summary  Pricsilla Mullins QJJ:[id number] DOB: 30-Jan-1981 DOA: 06/03/2017  PCP: Patient, No Pcp Per  Admit date: 06/03/2017 Discharge date: 06/05/2017  Time spent: 45 minutes  Recommendations for Outpatient Follow-up:  -Will be discharged home today. -Advised to follow up with PCP in 2 weeks.   Discharge Diagnoses:  Active Problems:   Intractable vomiting   Intractable vomiting with nausea   Chronic pain syndrome   Opioid dependence (HCC)   Hypokalemia   Dehydration   Chest pain   Discharge Condition: Stable and improved  Filed Weights   06/03/17 4818 06/03/17 1005  Weight: 77.1 kg (170 lb) 78.9 kg (174 lb)    History of present illness:  As per Dr. Carles Collet on 3/4: Brittany Mullins is a 37 y.o. female with medical history of Addison's disease, opioid dependence and benign brain tumor presenting with 2-1/2-week history of abdominal pain with nausea and vomiting.  Patient has also been complaining of subjective fevers and chills for the past 2-3 days.  She states that she is visiting some friends here in [place].  She previously resided in [place], but has lived in [place] state for the past few months.  The patient's history is inconsistent.  She has provided a different history for different providers.  She states that she has a history of adrenal insufficiency, but is not able to tell me the dose of prednisone over the last time she took it.  She states that she has been on methadone and Dilaudid for at least 4 years for chronic pain relating to her "brain tumor".  She states that she was diagnosed with a brain tumor many years ago, but is not able to provide any significant details regarding the type of tumor or workup.  Nevertheless, the patient visited the emergency department on 06/29/2017 for intractable vomiting and abdominal pain.  The patient was treated with IV fluids and potassium.  She was given IV Phenergan and discharged with prescriptions for  Carafate and Phenergan.  She did not get this filled.  She returns  on the morning of 06/03/2017 because of continued vomiting and abdominal pain.  She states that she had vomited "3 trash cans full"of emesis on 06/02/2017, and that she has vomited numerous times in the emergency department prior to my evaluation.  However, in review of the medical record and speaking with Dr. Thurnell Garbe, the patient has not had any emesis during her time in the emergency department today or yesterday.  In the emergency department, the patient was afebrile hemodynamically stable saturating 100% on room air.  The patient was given Solu-Medrol, IV fentanyl, IV potassium x2, magnesium, Haldol, but she refused Benadryl.    Hospital Course:   Intractable N/V/Abdominal pain -Abdominal Pain is out of proportion to exam findings. While patient is distracted she has no pain or guarding on exam. -RUQ [place] negative. -CT Abd negative. -Lipase 35 -UA without pyuria. -No emesis since admission, altho continues to c/o significant nausea and pain. -Asks for narcotics constantly. -Suspect cyclic vomiting for THC use vs malingering with narcotics as her secondary gain as she exhibits significant pain-seeking behavior.  Chronic Pain Syndrome -Has not been given IV narcotics this admission. -[place] Controlled Substance Database: Rx for hydrocodone on 05/29/2017. -[place] Database: Rx for methadone #120 filled on 04/22/17 and for dilaudid 2 mg #90 filled on same date. -No narcotic prescriptions have been provided on DC. -She has opioid-seeking behavior.  Hypertension/Tachycardia -question possible withdrawal from undetectable substance or synthetic opioid -  restarted po dilaudid -As the pt has been out of methadone >10 days, extremely unlikely to have methadone withdrawal -CTA chest--neg for PE; no edema or infiltrates -Echo--EF 60-65%, no WMA -start clonidine and metoprolol, prescriptions provided on DC. -TSH 0.707, Free T4 1.12  Elevated  troponin -demand ischemia due to tachycardia -trend is flat -Echo-- as above -no ACS  Hypokalemia -Repleted -Magnesium 1.8  ?Addison's disease? -doubt diagnosis - a.m. Cortisol--37.4  Transaminasemia -RUQ ultrasound--neg--no ductal dilatation -hep B surface antigen--neg -hep C antibody--neg -HIV neg -?ETOH use given AST:ALT ratio.  Questionable chronic pancreatitis -No radiographic evidence to suggest chronic pancreatitis -no biliary ductal dilatation on [place] or CT of abd -Lipase 44 -pt states she could not take Creon b/c it made her N/V and abd pain worse     Procedures:  As above   Consultations:  None  Discharge Instructions  Discharge Instructions    Diet - low sodium heart healthy   Complete by:  As directed    Increase activity slowly   Complete by:  As directed      Allergies as of 06/05/2017      Reactions   Bee Venom Anaphylaxis   Toradol [ketorolac Tromethamine] Shortness Of Breath   Benadryl [diphenhydramine] Other (See Comments)   States she cannot breathe      Medication List    TAKE these medications   acetaminophen 500 MG tablet Commonly known as:  TYLENOL Take 1,000 mg by mouth every 6 (six) hours as needed for headache.   cloNIDine 0.2 MG tablet Commonly known as:  CATAPRES Take 1 tablet (0.2 mg total) by mouth 3 (three) times daily.   HYDROmorphone 2 MG tablet Commonly known as:  DILAUDID Take by mouth every 6 (six) hours as needed for severe pain.   LESSINA-28 0.1-20 MG-MCG tablet Generic drug:  levonorgestrel-ethinyl estradiol Take 1 tablet by mouth daily.   methadone 10 MG tablet Commonly known as:  DOLOPHINE Take 10 mg by mouth every 8 (eight) hours.   metoprolol tartrate 25 MG tablet Commonly known as:  LOPRESSOR Take 1 tablet (25 mg total) by mouth 2 (two) times daily.   ofloxacin 0.3 % ophthalmic solution Commonly known as:  OCUFLOX Place 5 drops into both ears 2 (two) times daily.   ondansetron 4 MG  disintegrating tablet Commonly known as:  ZOFRAN-ODT Take 1 tablet (4 mg total) by mouth every 6 (six) hours as needed.   pantoprazole 40 MG tablet Commonly known as:  PROTONIX Take 1 tablet (40 mg total) by mouth 2 (two) times daily before a meal.   potassium chloride SA 20 MEQ tablet Commonly known as:  K-DUR,KLOR-CON Take 1 tablet by mouth daily.      Allergies  Allergen Reactions  . Bee Venom Anaphylaxis  . Toradol [Ketorolac Tromethamine] Shortness Of Breath  . Benadryl [Diphenhydramine] Other (See Comments)    States she cannot breathe   Follow-up Information    With your regular physician. Schedule an appointment as soon as possible for a visit in 2 week(s).            The results of significant diagnostics from this hospitalization (including imaging, microbiology, ancillary and laboratory) are listed below for reference.    Significant Diagnostic Studies: Dg Chest 2 View  Result Date: 06/03/2017 CLINICAL DATA:  Chest pain and vomiting today. EXAM: CHEST  2 VIEW COMPARISON:  None. FINDINGS: The heart size and mediastinal contours are within normal limits. Both lungs are clear. The visualized skeletal structures are  unremarkable. IMPRESSION: No active cardiopulmonary disease. Electronically Signed   By: San Morelle M.D.   On: 06/03/2017 17:27   Ct Head Wo Contrast  Result Date: 06/03/2017 CLINICAL DATA:  Posterior headache, nausea, vomiting x3 weeks EXAM: CT HEAD WITHOUT CONTRAST TECHNIQUE: Contiguous axial images were obtained from the base of the skull through the vertex without intravenous contrast. COMPARISON:  None. FINDINGS: Brain: No evidence of acute infarction, hemorrhage, hydrocephalus, extra-axial collection or mass lesion/mass effect. Vascular: No hyperdense vessel or unexpected calcification. Skull: Normal. Negative for fracture or focal lesion. Sinuses/Orbits: No acute finding. Other: None. IMPRESSION: Negative Electronically Signed   By: Lucrezia Europe  M.D.   On: 06/03/2017 11:10   Ct Angio Chest Pe W Or Wo Contrast  Result Date: 06/04/2017 CLINICAL DATA:  Chronic dyspnea.  Chest pain.  Addison's disease. EXAM: CT ANGIOGRAPHY CHEST WITH CONTRAST TECHNIQUE: Multidetector CT imaging of the chest was performed using the standard protocol during bolus administration of intravenous contrast. Multiplanar CT image reconstructions and MIPs were obtained to evaluate the vascular anatomy. CONTRAST:  148mL ISOVUE-370 IOPAMIDOL (ISOVUE-370) INJECTION 76% COMPARISON:  Chest radiograph from one day prior. FINDINGS: Cardiovascular: The study is high quality for the evaluation of pulmonary embolism. There are no filling defects in the central, lobar, segmental or subsegmental pulmonary artery branches to suggest acute pulmonary embolism. Normal course and caliber of the thoracic aorta and pulmonary arteries. Aberrrant nonaneurysmal right subclavian artery arising from the distal aortic arch with retroesophageal course. Normal heart size. No significant pericardial fluid/thickening. Mediastinum/Nodes: No discrete thyroid nodules. Unremarkable esophagus. No pathologically enlarged axillary, mediastinal or hilar lymph nodes. Coarsely calcified left hilar nodes from prior granulomatous disease. Lungs/Pleura: No pneumothorax. No pleural effusion. No acute consolidative airspace disease, lung masses or significant pulmonary nodules. Scattered coarsely calcified subcentimeter granulomas in the medial right middle lobe and posterior left upper lobe. Upper abdomen: Scattered calcified splenic granulomas. Musculoskeletal:  No aggressive appearing focal osseous lesions. Review of the MIP images confirms the above findings. IMPRESSION: 1. No pulmonary embolism.  No active pulmonary disease. 2. 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 Electronically Signed   By: Ilona Sorrel M.D.   On: 06/04/2017 15:15   Ct Renal Stone Study  Result Date:  06/02/2017 CLINICAL DATA:  37 year old female with history of abdominal pain and vomiting for the past 2 and half weeks. Cough a ground emesis. Fever and sore throat since yesterday. EXAM: CT ABDOMEN AND PELVIS WITHOUT CONTRAST TECHNIQUE: Multidetector CT imaging of the abdomen and pelvis was performed following the standard protocol without IV contrast. COMPARISON:  No priors. FINDINGS: Lower chest: Unremarkable. Hepatobiliary: No definite cystic or solid hepatic lesions are confidently identified on today's noncontrast CT examination. Status post cholecystectomy. Pancreas: No definite pancreatic mass or peripancreatic fluid or inflammatory changes are noted on today's noncontrast CT examination. Spleen: Numerous calcified granulomas in the spleen. Adrenals/Urinary Tract: Multiple small nonobstructive calculi are noted within the collecting systems of both kidneys measuring up to 5 mm in the upper pole collecting system of the left kidney. No additional calculi are noted along the course of either ureter or within the lumen of the urinary bladder. No hydroureteronephrosis. Unenhanced appearance of the urinary bladder is normal. Bilateral adrenal glands are normal in appearance. Stomach/Bowel: Unenhanced appearance of the stomach is normal. There is no pathologic dilatation of small bowel or colon. The appendix is not confidently identified and may be surgically absent. Regardless, there are no inflammatory changes noted adjacent to the cecum to  suggest the presence of an acute appendicitis at this time. Vascular/Lymphatic: No atherosclerotic calcifications or definite aneurysm identified in the abdominal or pelvic vasculature. No lymphadenopathy noted in the abdomen or pelvis. Reproductive: Status post hysterectomy. Ovaries are not confidently identified may be surgically absent or atrophic. Other: No significant volume of ascites.  No pneumoperitoneum. Musculoskeletal: There are no aggressive appearing lytic or  blastic lesions noted in the visualized portions of the skeleton. IMPRESSION: 1. No acute findings are noted in the abdomen or pelvis to account for the patient's symptoms. 2. Several nonobstructive calculi are noted within both renal collecting systems. No ureteral stones or findings of urinary tract obstruction are noted at this time. 3. Old granulomatous disease in the spleen. 4. Status post cholecystectomy. Electronically Signed   By: Vinnie Langton M.D.   On: 06/02/2017 17:56   US Abdomen Limited Ruq  Result Date: 06/03/2017 CLINICAL DATA:  Elevated LFTs, abdominal pain x2 weeks EXAM: ULTRASOUND ABDOMEN LIMITED RIGHT UPPER QUADRANT COMPARISON:  CT 06/02/2017 FINDINGS: Gallbladder: Surgically absent Common bile duct: Diameter: 4 mm, unremarkable Liver: No focal lesion identified. Within normal limits in parenchymal echogenicity. Portal vein is patent on color Doppler imaging with normal direction of blood flow towards the liver. IMPRESSION: No acute findings post cholecystectomy. Electronically Signed   By: Lucrezia Europe M.D.   On: 06/03/2017 11:07    Microbiology: Recent Results (from the past 240 hour(s))  Culture, Urine     Status: Abnormal   Collection Time: 06/03/17  8:48 AM  Result Value Ref Range Status   Specimen Description   Final    URINE, CLEAN CATCH Performed at [facility], [address], [place], [place] [address]    Special Requests   Final    NONE Performed at [facility], [address], [place], [place] [address]    Culture MULTIPLE SPECIES PRESENT, SUGGEST RECOLLECTION (A)  Final   Report Status 06/05/2017 FINAL  Final     Labs: Basic Metabolic Panel: Recent Labs  Lab 06/02/17 1639 06/03/17 0720 06/04/17 0440  NA 141 142 132*  K 2.7* 2.7* 3.5  CL 102 107 98*  CO2 23 19* 21*  GLUCOSE 95 102* 109*  BUN 17 12 7   CREATININE 1.71* 0.88 0.70  CALCIUM 9.6 9.4 9.3  MG  --  1.7 1.8   Liver Function Tests: Recent Labs  Lab 06/02/17 1639 06/03/17 0720  06/04/17 0440  AST 37 44* 57*  ALT 19 19 43  ALKPHOS 56 55 59  BILITOT 0.9 1.0 1.3*  PROT 7.9 7.5 7.7  ALBUMIN 4.7 4.3 4.1   Recent Labs  Lab 06/02/17 1639 06/03/17 0720  LIPASE 36 35   No results for input(s): AMMONIA in the last 168 hours. CBC: Recent Labs  Lab 06/02/17 1639 06/03/17 0720 06/04/17 0440  WBC 12.6* 10.0 15.2*  NEUTROABS  --  7.7  --   HGB 14.4 13.5 15.7*  HCT 42.4 39.3 45.9  MCV 88.1 87.5 87.8  PLT 250 238 265   Cardiac Enzymes: Recent Labs  Lab 06/03/17 1631 06/03/17 2223 06/04/17 0440  TROPONINI 0.03* 0.06* 0.09*   BNP: BNP (last 3 results) No results for input(s): BNP in the last 8760 hours.  ProBNP (last 3 results) No results for input(s): PROBNP in the last 8760 hours.  CBG: No results for input(s): GLUCAP in the last 168 hours.     Signed:  Lelon Frohlich  [facility] Pager: [phone] 06/05/2017, 9:54 AM

## 2017-06-05 NOTE — Progress Notes (Signed)
Pt given Pain medication prior to D/C per Dr. Jerilee Hoh.

## 2017-06-12 ENCOUNTER — Other Ambulatory Visit: Payer: Self-pay

## 2017-06-12 ENCOUNTER — Encounter: Payer: Self-pay | Admitting: Physician Assistant

## 2017-06-12 ENCOUNTER — Ambulatory Visit (INDEPENDENT_AMBULATORY_CARE_PROVIDER_SITE_OTHER): Payer: Medicare Other | Admitting: Physician Assistant

## 2017-06-12 VITALS — BP 130/88 | HR 77 | Temp 97.6°F | Resp 18 | Ht 64.02 in | Wt 176.0 lb

## 2017-06-12 DIAGNOSIS — Z86011 Personal history of benign neoplasm of the brain: Secondary | ICD-10-CM | POA: Diagnosis not present

## 2017-06-12 DIAGNOSIS — Z1329 Encounter for screening for other suspected endocrine disorder: Secondary | ICD-10-CM

## 2017-06-12 DIAGNOSIS — G894 Chronic pain syndrome: Secondary | ICD-10-CM | POA: Diagnosis not present

## 2017-06-12 DIAGNOSIS — Z13228 Encounter for screening for other metabolic disorders: Secondary | ICD-10-CM

## 2017-06-12 DIAGNOSIS — F1919 Other psychoactive substance abuse with unspecified psychoactive substance-induced disorder: Secondary | ICD-10-CM | POA: Diagnosis not present

## 2017-06-12 DIAGNOSIS — Z130 Encounter for screening for diseases of the blood and blood-forming organs and certain disorders involving the immune mechanism: Secondary | ICD-10-CM

## 2017-06-12 DIAGNOSIS — R1013 Epigastric pain: Secondary | ICD-10-CM | POA: Diagnosis not present

## 2017-06-12 DIAGNOSIS — Z1321 Encounter for screening for nutritional disorder: Secondary | ICD-10-CM | POA: Diagnosis not present

## 2017-06-12 DIAGNOSIS — G8929 Other chronic pain: Secondary | ICD-10-CM | POA: Diagnosis not present

## 2017-06-12 LAB — POCT URINALYSIS DIP (MANUAL ENTRY)
BILIRUBIN UA: NEGATIVE mg/dL
Bilirubin, UA: NEGATIVE
GLUCOSE UA: NEGATIVE mg/dL
Leukocytes, UA: NEGATIVE
Nitrite, UA: NEGATIVE
Protein Ur, POC: NEGATIVE mg/dL
RBC UA: NEGATIVE
SPEC GRAV UA: 1.02 (ref 1.010–1.025)
UROBILINOGEN UA: 0.2 U/dL
pH, UA: 6 (ref 5.0–8.0)

## 2017-06-12 NOTE — Progress Notes (Signed)
06/12/2017 2:05 PM   DOB: 08-10-1980 / MRN: [id number]  SUBJECTIVE:  Brittany Mullins is a 37 y.o. female presenting for hospital follow-up.  Discharged from [facility] on 06/05/2017.  Patient presented to the hospital previous to that with intractable nausea vomiting abdominal pain thought to be secondary to intractable illicit drug use or synthetic opioid.  Patient reports a history of Addison's, brain tumor, pancreatitis however none of these complaints could be substantiated on workup.  She complains mostly of chronic headache and chronic abdominal pain.  She would like me to refer her to a chronic pain management specialist.  She would also like a referral to GI.  She tells me she has a history of brain tumor that causes the headaches, however her most recent CT scan shows no evidence of this.  She denies a history of brain cancer.  She tells me that [facility] told her that she had a brain tumor however those records do not exist in care everywhere that I can find.  She tells me that she is taking methadone for history of chronic pancreatitis and became hooked on narcotics while trying to treat the pancreatitis.  She denies illicit drug use.  She is requesting Phenergan suppositories today.  I have refused this.   She is allergic to bee venom; toradol [ketorolac tromethamine]; and benadryl [diphenhydramine].   She  has a past medical history of Addison's disease ([place]), Brain tumor (benign) ([place]), Hypertension, Marijuana use, Pancreatitis, PCOS (polycystic ovarian syndrome), Pituitary tumor, and Seizures ([place]).    She  reports that  has never smoked. she has never used smokeless tobacco. She reports that she does not drink alcohol or use drugs. She  reports that she currently engages in sexual activity. The patient  has a past surgical history that includes Cholecystectomy; pancreatic stent; kidney stent; Elbow surgery; Abdominal hysterectomy; Appendectomy; and Cesarean section.  Her  family history includes Cancer in her mother; Diabetes in her maternal grandfather and maternal grandmother; Hypertension in her mother.  Review of Systems  Constitutional: Negative for chills, diaphoresis and fever.  Gastrointestinal: Negative for nausea.  Skin: Negative for rash.  Neurological: Negative for dizziness and focal weakness.    The problem list and medications were reviewed and updated by myself where necessary and exist elsewhere in the encounter.   OBJECTIVE:  BP 130/88 (BP Location: Right Arm, Patient Position: Sitting, Cuff Size: Normal)   Pulse 77   Temp 97.6 F (36.4 C) (Oral)   Resp 18   Ht 5' 4.02" (1.626 m)   Wt 176 lb (79.8 kg)   SpO2 96%   BMI 30.20 kg/m   Physical Exam  Constitutional: She is active.  Non-toxic appearance.  She has bright pink hair.  Cardiovascular: Normal rate, regular rhythm, S1 normal, S2 normal, normal heart sounds and intact distal pulses. Exam reveals no gallop, no friction rub and no decreased pulses.  No murmur heard. Pulmonary/Chest: Effort normal. No stridor. No tachypnea. No respiratory distress. She has no wheezes. She has no rales.  Abdominal: She exhibits no distension.  Musculoskeletal: She exhibits no edema.  Neurological: She is alert.  Skin: Skin is warm and dry. She is not diaphoretic. No pallor.  Psychiatric: Thought content normal.  She speaks slowly, squints her eyes as if the room is too bright, holds her belly throughout my interview with her.    Lab Results  Component Value Date   CREATININE 0.70 06/04/2017   BUN 7 06/04/2017  NA 132 (L) 06/04/2017   K 3.5 06/04/2017   CL 98 (L) 06/04/2017   CO2 21 (L) 06/04/2017     Results for orders placed or performed in visit on 06/12/17 (from the past 72 hour(s))  POCT urinalysis dipstick     Status: None   Collection Time: 06/12/17 10:53 AM  Result Value Ref Range   Color, UA yellow yellow   Clarity, UA clear clear   Glucose, UA negative negative mg/dL     Bilirubin, UA negative negative   Ketones, POC UA negative negative mg/dL   Spec Grav, UA 1.020 1.010 - 1.025   Blood, UA negative negative   pH, UA 6.0 5.0 - 8.0   Protein Ur, POC negative negative mg/dL   Urobilinogen, UA 0.2 0.2 or 1.0 E.U./dL   Nitrite, UA Negative Negative   Leukocytes, UA Negative Negative    No results found.  ASSESSMENT AND PLAN:  Genavie was seen today for establish care and referral.  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  Appears to be fairly clear from her discharge summary that the patient is malingering.  For her history of brain tumor as well as chronic headache I am sending her to neurology for more evaluation.  For chronic pain I have advised that I will be filling no narcotics nor will be providing her with rectal Phenergan.  I placed a referral to pain management for fastest possible.  Unfortunately I have no labs or records to substantiate her chronic diagnosis list.  I think if neuro and gastro can clear her she can be managed mostly by pain management and/or psychiatry (I will refer once she has seen gastritro, neurology, pain management.)       -     POCT urinalysis dipstick -     179150 11+Oxyco+Alc+Crt-Bund  Abdominal pain, chronic, epigastric -     Ambulatory referral to Gastroenterology  Chronic pain syndrome -     Ambulatory referral to Pain Clinic  Personal history of benign brain tumor -     Ambulatory referral to Neurology  Screening for endocrine, nutritional, metabolic and immunity disorder -     CBC -     CMP14+EGFR    The patient is advised to call or return to clinic if she does not see an improvement in symptoms, or to seek the care of the closest emergency department if she worsens with the above plan.   Philis Fendt, MHS, PA-C Primary Care at  [facility] 06/12/2017 2:05 PM

## 2017-06-12 NOTE — Patient Instructions (Signed)
     IF you received an x-ray today, you will receive an invoice from [facility]. Please contact [facility] at [phone] with questions or concerns regarding your invoice.   IF you received labwork today, you will receive an invoice from [facility]. Please contact [facility] at [phone] with questions or concerns regarding your invoice.   Our billing staff will not be able to assist you with questions regarding bills from these companies.  You will be contacted with the lab results as soon as they are available. The fastest way to get your results is to activate your My Chart account. Instructions are located on the last page of this paperwork. If you have not heard from us regarding the results in 2 weeks, please contact this office.     

## 2017-06-13 LAB — CMP14+EGFR
A/G RATIO: 1.6 (ref 1.2–2.2)
ALK PHOS: 60 IU/L (ref 39–117)
ALT: 17 IU/L (ref 0–32)
AST: 23 IU/L (ref 0–40)
Albumin: 4.1 g/dL (ref 3.5–5.5)
BILIRUBIN TOTAL: 0.3 mg/dL (ref 0.0–1.2)
BUN/Creatinine Ratio: 7 — ABNORMAL LOW (ref 9–23)
BUN: 5 mg/dL — ABNORMAL LOW (ref 6–20)
CALCIUM: 9.2 mg/dL (ref 8.7–10.2)
CHLORIDE: 99 mmol/L (ref 96–106)
CO2: 24 mmol/L (ref 20–29)
Creatinine, Ser: 0.67 mg/dL (ref 0.57–1.00)
GFR calc Af Amer: 133 mL/min/{1.73_m2} (ref >59)
GFR, EST NON AFRICAN AMERICAN: 115 mL/min/{1.73_m2} (ref >59)
Globulin, Total: 2.5 g/dL (ref 1.5–4.5)
Glucose: 77 mg/dL (ref 65–99)
POTASSIUM: 3.6 mmol/L (ref 3.5–5.2)
SODIUM: 139 mmol/L (ref 134–144)
Total Protein: 6.6 g/dL (ref 6.0–8.5)

## 2017-06-13 LAB — CBC
Hematocrit: 36.2 % (ref 34.0–46.6)
Hemoglobin: 12 g/dL (ref 11.1–15.9)
MCH: 30.4 pg (ref 26.6–33.0)
MCHC: 33.1 g/dL (ref 31.5–35.7)
MCV: 92 fL (ref 79–97)
PLATELETS: 204 10*3/uL (ref 150–379)
RBC: 3.95 x10E6/uL (ref 3.77–5.28)
RDW: 13.1 % (ref 12.3–15.4)
WBC: 6.8 10*3/uL (ref 3.4–10.8)

## 2017-06-16 LAB — DRUG SCREEN 764883 11+OXYCO+ALC+CRT-BUND
Amphetamines, Urine: NEGATIVE ng/mL
BENZODIAZ UR QL: NEGATIVE ng/mL
Barbiturate: NEGATIVE ng/mL
CREATININE: 129 mg/dL (ref 20.0–300.0)
Cocaine (Metabolite): NEGATIVE ng/mL
Ethanol: NEGATIVE %
MEPERIDINE: NEGATIVE ng/mL
OPIATE SCREEN URINE: NEGATIVE ng/mL
OXYCODONE+OXYMORPHONE UR QL SCN: NEGATIVE ng/mL
PH OF URINE: 5.6 (ref 4.5–8.9)
PROPOXYPHENE: NEGATIVE ng/mL
Phencyclidine: NEGATIVE ng/mL
Tramadol: NEGATIVE ng/mL

## 2017-06-16 LAB — CANNABINOID CONFIRMATION, UR
CANNABINOIDS: POSITIVE — AB
CARBOXY THC GC/MS CONF: 243 ng/mL

## 2017-06-16 LAB — METHADONE CONF GC/MS
METHADONE GC/MS CONF: 5795 ng/mL
METHADONE: POSITIVE — AB

## 2017-06-19 DIAGNOSIS — R1115 Cyclical vomiting syndrome unrelated to migraine: Secondary | ICD-10-CM

## 2017-06-19 DIAGNOSIS — Z8639 Personal history of other endocrine, nutritional and metabolic disease: Secondary | ICD-10-CM

## 2017-06-27 ENCOUNTER — Encounter: Payer: Self-pay | Admitting: Gastroenterology

## 2017-07-03 ENCOUNTER — Telehealth: Payer: Self-pay | Admitting: Physician Assistant

## 2017-07-03 NOTE — Telephone Encounter (Signed)
Pt insurance is not accepted at preferred pain management.

## 2017-08-07 ENCOUNTER — Ambulatory Visit: Payer: Medicare Other | Admitting: Gastroenterology

## 2017-08-19 ENCOUNTER — Ambulatory Visit: Payer: Medicare Other | Admitting: Physician Assistant

## 2017-08-20 ENCOUNTER — Ambulatory Visit: Payer: Medicare Other | Admitting: Physician Assistant

## 2017-08-30 ENCOUNTER — Telehealth: Payer: Self-pay | Admitting: Neurology

## 2017-08-30 ENCOUNTER — Ambulatory Visit: Payer: Medicare Other | Admitting: Neurology

## 2017-08-30 NOTE — Telephone Encounter (Signed)
This patient did not show for a new patient appointment today. 

## 2017-09-02 ENCOUNTER — Encounter: Payer: Self-pay | Admitting: Neurology

## 2017-09-16 ENCOUNTER — Ambulatory Visit: Payer: Medicare Other | Admitting: Physician Assistant

## 2017-09-27 ENCOUNTER — Ambulatory Visit: Payer: Medicare Other | Admitting: Physician Assistant

## 2017-10-09 ENCOUNTER — Ambulatory Visit: Payer: Medicare Other | Admitting: Gastroenterology

## 2017-11-19 ENCOUNTER — Other Ambulatory Visit: Payer: Self-pay | Admitting: Physician Assistant

## 2017-11-19 NOTE — Telephone Encounter (Signed)
Copied from [place] [id number]. Topic: Quick Communication - Rx Refill/Question >> Nov 19, 2017  2:25 PM Synthia Innocent wrote: Medication: LESSINA-28 0.1-20 MG-MCG tablet   Has the patient contacted their pharmacy? Yes.   (Agent: If no, request that the patient contact the pharmacy for the refill.) (Agent: If yes, when and what did the pharmacy advise?)  Preferred Pharmacy (with phone number or street name): [facility] on [address]   Agent: Please be advised that RX refills may take up to 3 business days. We ask that you follow-up with your pharmacy.

## 2017-11-19 NOTE — Telephone Encounter (Signed)
Lessina refill Last Refill:06/02/17 by historical provider Last OV: 06/12/17 PCP: [place]ie: [facility] [place], [address] AT [place] [phone] (Phone) [phone] (Fax)

## 2017-11-20 MED ORDER — LESSINA 0.1-20 MG-MCG PO TABS: 1.0000 | ORAL_TABLET | Freq: Every day | ORAL | 0 refills | Status: DC

## 2017-11-20 NOTE — Telephone Encounter (Signed)
Lessina-28 0.1-20 mg-mcg #1 pack (30 day supply) with 0 refills.  Spoke with pt via phone and asked if any bc doses missed and per pt she has not missed a dose of birth control.  Advised we will refill per Carlis Abbott since no doses were missed but no refills will be given and she will need to f/u in office for any future refills of this medication.  Pt verbalized understanding and agreeable. Dgaddy, CMA

## 2017-12-11 ENCOUNTER — Encounter

## 2017-12-11 ENCOUNTER — Ambulatory Visit: Payer: Medicare Other | Admitting: Gastroenterology

## 2017-12-13 ENCOUNTER — Telehealth: Payer: Self-pay | Admitting: Physician Assistant

## 2017-12-13 NOTE — Telephone Encounter (Signed)
Copied from [place] [id number]. Topic: General - Other >> Dec 13, 2017  8:05 AM Lennox Solders wrote: Reason for CRM: pt is calling and she said Philis Fendt said he will refill her bp clonidine 0.2 mg #90. This med was prescribed by her old physician. [facility] [address]. Pt is out

## 2017-12-13 NOTE — Telephone Encounter (Signed)
Patient will need to schedule an appointment for any medications.

## 2017-12-21 ENCOUNTER — Emergency Department (HOSPITAL_COMMUNITY): Payer: Medicare Other

## 2017-12-21 ENCOUNTER — Emergency Department (HOSPITAL_COMMUNITY)
Admission: EM | Admit: 2017-12-21 | Discharge: 2017-12-21 | Disposition: A | Discharge status: Home / Self Care | Payer: Medicare Other | Attending: Emergency Medicine | Admitting: Emergency Medicine

## 2017-12-21 ENCOUNTER — Encounter (HOSPITAL_COMMUNITY): Payer: Self-pay

## 2017-12-21 ENCOUNTER — Other Ambulatory Visit: Payer: Self-pay

## 2017-12-21 DIAGNOSIS — Y92410 Unspecified street and highway as the place of occurrence of the external cause: Secondary | ICD-10-CM | POA: Insufficient documentation

## 2017-12-21 DIAGNOSIS — Y9389 Activity, other specified: Secondary | ICD-10-CM | POA: Insufficient documentation

## 2017-12-21 DIAGNOSIS — Y999 Unspecified external cause status: Secondary | ICD-10-CM | POA: Insufficient documentation

## 2017-12-21 DIAGNOSIS — R0789 Other chest pain: Secondary | ICD-10-CM

## 2017-12-21 DIAGNOSIS — R1010 Upper abdominal pain, unspecified: Secondary | ICD-10-CM | POA: Insufficient documentation

## 2017-12-21 DIAGNOSIS — S161XXA Strain of muscle, fascia and tendon at neck level, initial encounter: Secondary | ICD-10-CM | POA: Diagnosis not present

## 2017-12-21 DIAGNOSIS — S0990XA Unspecified injury of head, initial encounter: Secondary | ICD-10-CM | POA: Insufficient documentation

## 2017-12-21 DIAGNOSIS — Z79899 Other long term (current) drug therapy: Secondary | ICD-10-CM | POA: Diagnosis not present

## 2017-12-21 DIAGNOSIS — S199XXA Unspecified injury of neck, initial encounter: Secondary | ICD-10-CM | POA: Diagnosis present

## 2017-12-21 DIAGNOSIS — I10 Essential (primary) hypertension: Secondary | ICD-10-CM | POA: Diagnosis not present

## 2017-12-21 LAB — I-STAT CHEM 8, ED
BUN: 15 mg/dL (ref 6–20)
Calcium, Ion: 1.14 mmol/L — ABNORMAL LOW (ref 1.15–1.40)
Chloride: 102 mmol/L (ref 98–111)
Creatinine, Ser: 0.7 mg/dL (ref 0.44–1.00)
Glucose, Bld: 85 mg/dL (ref 70–99)
HCT: 40 % (ref 36.0–46.0)
Hemoglobin: 13.6 g/dL (ref 12.0–15.0)
Potassium: 3.4 mmol/L — ABNORMAL LOW (ref 3.5–5.1)
Sodium: 138 mmol/L (ref 135–145)
TCO2: 24 mmol/L (ref 22–32)

## 2017-12-21 LAB — COMPREHENSIVE METABOLIC PANEL WITH GFR
ALT: 20 U/L (ref 0–44)
AST: 36 U/L (ref 15–41)
Albumin: 3.8 g/dL (ref 3.5–5.0)
Alkaline Phosphatase: 55 U/L (ref 38–126)
Anion gap: 10 (ref 5–15)
BUN: 14 mg/dL (ref 6–20)
CO2: 24 mmol/L (ref 22–32)
Calcium: 8.6 mg/dL — ABNORMAL LOW (ref 8.9–10.3)
Chloride: 104 mmol/L (ref 98–111)
Creatinine, Ser: 0.8 mg/dL (ref 0.44–1.00)
GFR calc Af Amer: >60 mL/min (ref >60)
GFR calc non Af Amer: >60 mL/min (ref >60)
Glucose, Bld: 92 mg/dL (ref 70–99)
Potassium: 3.3 mmol/L — ABNORMAL LOW (ref 3.5–5.1)
Sodium: 138 mmol/L (ref 135–145)
Total Bilirubin: 0.6 mg/dL (ref 0.3–1.2)
Total Protein: 6.7 g/dL (ref 6.5–8.1)

## 2017-12-21 LAB — CDS SEROLOGY

## 2017-12-21 LAB — CBC
HCT: 39.6 % (ref 36.0–46.0)
Hemoglobin: 13 g/dL (ref 12.0–15.0)
MCH: 30.3 pg (ref 26.0–34.0)
MCHC: 32.8 g/dL (ref 30.0–36.0)
MCV: 92.3 fL (ref 78.0–100.0)
Platelets: 234 K/uL (ref 150–400)
RBC: 4.29 MIL/uL (ref 3.87–5.11)
RDW: 12.3 % (ref 11.5–15.5)
WBC: 11.6 K/uL — ABNORMAL HIGH (ref 4.0–10.5)

## 2017-12-21 LAB — SAMPLE TO BLOOD BANK

## 2017-12-21 LAB — PROTIME-INR
INR: 0.97
PROTHROMBIN TIME: 12.8 s (ref 11.4–15.2)

## 2017-12-21 LAB — I-STAT CG4 LACTIC ACID, ED: Lactic Acid, Venous: 1.27 mmol/L (ref 0.5–1.9)

## 2017-12-21 LAB — ETHANOL: Alcohol, Ethyl (B): <10 mg/dL (ref <10)

## 2017-12-21 MED ORDER — ONDANSETRON HCL 4 MG/2ML IJ SOLN
4.0000 mg | Freq: Once | INTRAMUSCULAR | Status: AC
  Administered 2017-12-21: 4 mg via INTRAVENOUS
  Filled 2017-12-21: qty 2

## 2017-12-21 MED ORDER — OXYCODONE-ACETAMINOPHEN 5-325 MG PO TABS: 1.0000 | ORAL_TABLET | ORAL | 0 refills | Status: DC | PRN

## 2017-12-21 MED ORDER — MORPHINE SULFATE (PF) 4 MG/ML IV SOLN
4.0000 mg | Freq: Once | INTRAVENOUS | Status: AC
  Administered 2017-12-21: 4 mg via INTRAVENOUS
  Filled 2017-12-21: qty 1

## 2017-12-21 MED ORDER — OXYCODONE-ACETAMINOPHEN 5-325 MG PO TABS
2.0000 | ORAL_TABLET | Freq: Once | ORAL | Status: AC
  Administered 2017-12-21: 2 via ORAL
  Filled 2017-12-21: qty 2

## 2017-12-21 MED ORDER — IOHEXOL 300 MG/ML  SOLN
100.0000 mL | Freq: Once | INTRAMUSCULAR | Status: AC | PRN
  Administered 2017-12-21: 100 mL via INTRAVENOUS
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
## 2017-12-21 NOTE — ED Notes (Signed)
Patient transported to X-ray 

## 2017-12-21 NOTE — ED Notes (Signed)
Patient transported to CT 
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
## 2017-12-21 NOTE — ED Notes (Signed)
Pt now reporting new onset nausea. Spoke with Sam, PA for additional orders.
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
## 2017-12-21 NOTE — ED Notes (Signed)
ED Provider at bedside. 

## 2017-12-21 NOTE — ED Notes (Signed)
Pt departed in NAD. Given taxi voucher for transport home.

## 2018-01-01 NOTE — Telephone Encounter (Signed)
Pt has an appt on 01/04/18

## 2018-01-04 ENCOUNTER — Ambulatory Visit (INDEPENDENT_AMBULATORY_CARE_PROVIDER_SITE_OTHER): Payer: Medicare Other | Admitting: Family Medicine

## 2018-01-04 ENCOUNTER — Other Ambulatory Visit: Payer: Self-pay

## 2018-01-04 ENCOUNTER — Other Ambulatory Visit: Payer: Self-pay | Admitting: Family Medicine

## 2018-01-04 ENCOUNTER — Encounter: Payer: Self-pay | Admitting: Family Medicine

## 2018-01-04 VITALS — BP 146/79 | HR 83 | Temp 98.0°F | Resp 18 | Ht 63.98 in | Wt 155.4 lb

## 2018-01-04 DIAGNOSIS — R569 Unspecified convulsions: Secondary | ICD-10-CM | POA: Diagnosis not present

## 2018-01-04 DIAGNOSIS — Z87898 Personal history of other specified conditions: Secondary | ICD-10-CM

## 2018-01-04 DIAGNOSIS — I10 Essential (primary) hypertension: Secondary | ICD-10-CM

## 2018-01-04 DIAGNOSIS — F411 Generalized anxiety disorder: Secondary | ICD-10-CM | POA: Diagnosis not present

## 2018-01-04 MED ORDER — PROMETHAZINE HCL 25 MG PO TABS: 25.0000 mg | ORAL_TABLET | Freq: Three times a day (TID) | ORAL | 0 refills | Status: DC | PRN

## 2018-01-04 MED ORDER — CLONIDINE HCL 0.2 MG PO TABS: 0.2000 mg | ORAL_TABLET | Freq: Three times a day (TID) | ORAL | 1 refills | Status: DC

## 2018-01-04 NOTE — Patient Instructions (Signed)
° ° ° °  If you have lab work done today you will be contacted with your lab results within the next 2 weeks.  If you have not heard from us then please contact us. The fastest way to get your results is to register for My Chart. ° ° °IF you received an x-ray today, you will receive an invoice from [facility]. Please contact [facility] at [phone] with questions or concerns regarding your invoice.  ° °IF you received labwork today, you will receive an invoice from [facility]. Please contact [facility] at [phone] with questions or concerns regarding your invoice.  ° °Our billing staff will not be able to assist you with questions regarding bills from these companies. ° °You will be contacted with the lab results as soon as they are available. The fastest way to get your results is to activate your My Chart account. Instructions are located on the last page of this paperwork. If you have not heard from us regarding the results in 2 weeks, please contact this office. °  ° ° ° °

## 2018-01-04 NOTE — Progress Notes (Signed)
10/5/201911:53 AM  Brittany Mullins Jan 13, 1981, 37 y.o. female [id number]  Chief Complaint  Patient presents with  . Hypertension    f/u  . Medication Refill    clonidine- last had b/p med X2 weeks and phenergan  . Paper Work    pt need a letter for emotional support animal    HPI:   Patient is a 36 y.o. 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 No No     Depression screen [facility] 2/9 01/04/2018 06/12/2017  Decreased Interest 0 0  Down, Depressed, Hopeless 0 0  PHQ - 2 Score 0 0    Allergies  Allergen Reactions  . Bee Venom Anaphylaxis  . Toradol [Ketorolac Tromethamine] Shortness Of Breath  . Benadryl [Diphenhydramine] Other (See Comments)    States she cannot breathe    Prior to Admission medications   Medication Sig Start Date End Date Taking? Authorizing Provider  acetaminophen (TYLENOL) 500 MG tablet Take 1,000 mg by mouth every 6 (six) hours as needed for headache.   Yes [provider]  cloNIDine (CATAPRES) 0.2 MG tablet Take 1 tablet (0.2 mg total) by mouth 3 (three) times daily. 06/05/17  Yes Erline Hau, MD  LESSINA-28 0.1-20 MG-MCG tablet Take 1  tablet by mouth daily. 11/20/17  Yes Tereasa Coop, PA-C  ondansetron (ZOFRAN) 4 MG tablet Take 4 mg by mouth every 8 (eight) hours as needed for nausea or vomiting.   Yes [provider]  ondansetron (ZOFRAN-ODT) 4 MG disintegrating tablet Take 1 tablet (4 mg total) by mouth every 6 (six) hours as needed. 06/05/17  Yes Isaac Bliss, Rayford Halsted, MD  pantoprazole (PROTONIX) 40 MG tablet Take 1 tablet (40 mg total) by mouth 2 (two) times daily before a meal. 06/05/17  Yes Isaac Bliss, Rayford Halsted, MD  promethazine (PHENERGAN) 25 MG tablet Take 25 mg by mouth every 4 (four) hours as needed for nausea or vomiting.   Yes [provider]  promethazine-dextromethorphan (PROMETHAZINE-DM) 6.25-15 MG/5ML syrup Take by mouth 4 (four) times daily as needed for cough.   Yes [provider]  SUMAtriptan (IMITREX) 6 MG/0.5ML SOLN injection Inject 6 mg into the skin every 2 (two) hours as needed for migraine or headache. May repeat in 2 hours if headache persists or recurs.   Yes [provider]    Past Medical History:  Diagnosis Date  . Addison's disease ([place])   . Brain tumor (benign) ([place])   . Hypertension   . Marijuana use    Per pt: "medical marijuana patient"  . Pancreatitis   . PCOS (polycystic ovarian syndrome)   . Pituitary tumor   .  Seizures ([place])     Past Surgical History:  Procedure Laterality Date  . ABDOMINAL HYSTERECTOMY    . APPENDECTOMY    . CESAREAN SECTION    . CHOLECYSTECTOMY    . ELBOW SURGERY    . kidney stent    . pancreatic stent      Social History   Tobacco Use  . Smoking status: Never Smoker  . Smokeless tobacco: Never Used  Substance Use Topics  . Alcohol use: No    Frequency: Never    Family History  Problem Relation Age of Onset  . Cancer Mother   . Hypertension Mother   . Diabetes Maternal Grandmother   . Diabetes Maternal Grandfather     Review of Systems  Constitutional: Negative for chills and fever.    Respiratory: Negative for cough and shortness of breath.   Cardiovascular: Negative for chest pain, palpitations and leg swelling.  Gastrointestinal: Positive for abdominal pain, nausea and vomiting.  Neurological: Positive for seizures.  Psychiatric/Behavioral: The patient is nervous/anxious.      OBJECTIVE:  Blood pressure (!) 146/79, pulse 83, temperature 98 F (36.7 C), temperature source Oral, resp. rate 18, height 5' 3.98" (1.625 m), weight 155 lb 6.4 oz (70.5 kg), SpO2 98 %. Body mass index is 26.69 kg/m.   Physical Exam  Constitutional: She is oriented to person, place, and time. She appears well-developed and well-nourished.  HENT:  Head: Normocephalic and atraumatic.  Mouth/Throat: Oropharynx is clear and moist. No oropharyngeal exudate.  Eyes: Pupils are equal, round, and reactive to light. Conjunctivae and EOM are normal. No scleral icterus.  Neck: Neck supple.  Cardiovascular: Normal rate, regular rhythm and normal heart sounds. Exam reveals no gallop and no friction rub.  No murmur heard. Pulmonary/Chest: Effort normal and breath sounds normal. She has no wheezes. She has no rales.  Musculoskeletal: She exhibits no edema.  Neurological: She is alert and oriented to person, place, and time.  Skin: Skin is warm and dry.  Psychiatric: She has a normal mood and affect.  Nursing note and vitals reviewed.   ASSESSMENT and PLAN  1. Essential hypertension Uncontrolled in setting of not being on medications, restarted today  2. Generalized anxiety disorder Patient reports she does well on clonidine and with the support of her dogs. Letter provided  3. Seizures ([place]) Has upcoming appt with neuro  4. History of nausea and vomiting Phenergran refilled. Needs to call GI for appt  Other orders - cloNIDine (CATAPRES) 0.2 MG tablet; Take 1 tablet (0.2 mg total) by mouth 3 (three) times daily. - promethazine (PHENERGAN) 25 MG tablet; Take 1 tablet (25 mg total) by mouth  every 8 (eight) hours as needed for nausea or vomiting.  Return in about 6 months (around 07/06/2018) for HTN.    Rutherford Guys, MD Primary Care at [place], [place] [address] Ph.  [phone] Fax [phone]

## 2018-01-06 NOTE — Telephone Encounter (Signed)
Requested Prescriptions  Refused Prescriptions Disp Refills  . cloNIDine (CATAPRES) 0.2 MG tablet [Pharmacy Med Name: CLONIDINE 0.2MG  TABLETS] 270 tablet 1    Sig: TAKE 1 TABLET(0.2 MG) BY MOUTH THREE TIMES DAILY     Cardiovascular:  Alpha-2 Agonists Failed - 01/04/2018 12:21 PM      Failed - Last BP in normal range    BP Readings from Last 1 Encounters:  01/04/18 (!) 146/79         Passed - Last Heart Rate in normal range    Pulse Readings from Last 1 Encounters:  01/04/18 83         Passed - Valid encounter within last 6 months    Recent Outpatient Visits          2 days ago Essential hypertension   [facility], Lilia Argue, MD   6 months ago 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

## 2018-01-28 ENCOUNTER — Telehealth: Payer: Self-pay | Admitting: Family Medicine

## 2018-01-28 NOTE — Telephone Encounter (Signed)
Copied from [place] [id number]. Topic: General - Other >> Jan 28, 2018 11:04 AM Carolyn Stare wrote:  Fernanda Drum with [facility] call to say they faxed over a  coordination of care and medication list and ask that it be faxed back. It not received please contact Shameka at [phone] ext [phone]    [phone]

## 2018-01-30 NOTE — Telephone Encounter (Signed)
Spoke to [facility] faxed by Medical Records yesterday

## 2018-03-07 ENCOUNTER — Other Ambulatory Visit: Payer: Self-pay | Admitting: Family Medicine

## 2018-03-07 NOTE — Telephone Encounter (Signed)
Requested Prescriptions  Pending Prescriptions Disp Refills  . cloNIDine (CATAPRES) 0.2 MG tablet [Pharmacy Med Name: CLONIDINE 0.2MG  TABLETS] 270 tablet 1    Sig: TAKE 1 TABLET(0.2 MG) BY MOUTH THREE TIMES DAILY     Cardiovascular:  Alpha-2 Agonists Failed - 03/07/2018  9:24 AM      Failed - Last BP in normal range    BP Readings from Last 1 Encounters:  01/04/18 (!) 146/79         Passed - Last Heart Rate in normal range    Pulse Readings from Last 1 Encounters:  01/04/18 83         Passed - Valid encounter within last 6 months    Recent Outpatient Visits          2 months ago Essential hypertension   [facility], Lilia Argue, MD   8 months ago 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

## 2018-03-07 NOTE — Telephone Encounter (Signed)
Requested Prescriptions  Pending Prescriptions Disp Refills  . cloNIDine (CATAPRES) 0.2 MG tablet [Pharmacy Med Name: CLONIDINE 0.2MG  TABLETS] 90 tablet 1    Sig: TAKE 1 TABLET(0.2 MG) BY MOUTH THREE TIMES DAILY     Cardiovascular:  Alpha-2 Agonists Failed - 03/07/2018  3:55 AM      Failed - Last BP in normal range    BP Readings from Last 1 Encounters:  01/04/18 (!) 146/79         Passed - Last Heart Rate in normal range    Pulse Readings from Last 1 Encounters:  01/04/18 83         Passed - Valid encounter within last 6 months    Recent Outpatient Visits          2 months ago Essential hypertension   [facility], Lilia Argue, MD   8 months ago 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)   Primary Care at [place], PA-C

## 2018-05-17 ENCOUNTER — Other Ambulatory Visit: Payer: Self-pay | Admitting: Family Medicine

## 2018-05-19 NOTE — Telephone Encounter (Signed)
Requested Prescriptions  Pending Prescriptions Disp Refills  . cloNIDine (CATAPRES) 0.2 MG tablet [Pharmacy Med Name: CLONIDINE 0.2MG  TABLETS] 90 tablet     Sig: TAKE 1 TABLET(0.2 MG) BY MOUTH THREE TIMES DAILY     Cardiovascular:  Alpha-2 Agonists Failed - 05/17/2018  3:52 AM      Failed - Last BP in normal range    BP Readings from Last 1 Encounters:  01/04/18 (!) 146/79         Passed - Last Heart Rate in normal range    Pulse Readings from Last 1 Encounters:  01/04/18 83         Passed - Valid encounter within last 6 months    Recent Outpatient Visits          4 months ago Essential hypertension   [facility], Lilia Argue, MD   11 months ago 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

## 2018-06-17 ENCOUNTER — Other Ambulatory Visit: Payer: Self-pay | Admitting: Physician Assistant

## 2018-07-23 ENCOUNTER — Emergency Department (HOSPITAL_COMMUNITY): Payer: Medicare Other

## 2018-07-23 ENCOUNTER — Encounter (HOSPITAL_COMMUNITY): Payer: Self-pay | Admitting: Emergency Medicine

## 2018-07-23 ENCOUNTER — Emergency Department (HOSPITAL_COMMUNITY)
Admission: EM | Admit: 2018-07-23 | Discharge: 2018-07-23 | Disposition: A | Discharge status: Home / Self Care | Payer: Medicare Other | Attending: Emergency Medicine | Admitting: Emergency Medicine

## 2018-07-23 DIAGNOSIS — Z79899 Other long term (current) drug therapy: Secondary | ICD-10-CM | POA: Insufficient documentation

## 2018-07-23 DIAGNOSIS — R102 Pelvic and perineal pain: Secondary | ICD-10-CM | POA: Diagnosis not present

## 2018-07-23 DIAGNOSIS — I10 Essential (primary) hypertension: Secondary | ICD-10-CM | POA: Insufficient documentation

## 2018-07-23 DIAGNOSIS — E860 Dehydration: Secondary | ICD-10-CM | POA: Insufficient documentation

## 2018-07-23 DIAGNOSIS — N200 Calculus of kidney: Secondary | ICD-10-CM | POA: Insufficient documentation

## 2018-07-23 DIAGNOSIS — R112 Nausea with vomiting, unspecified: Secondary | ICD-10-CM

## 2018-07-23 LAB — CBC WITH DIFFERENTIAL/PLATELET
Abs Immature Granulocytes: 0.07 10*3/uL (ref 0.00–0.07)
Basophils Absolute: 0.1 10*3/uL (ref 0.0–0.1)
Basophils Relative: 0 %
Eosinophils Absolute: 0 10*3/uL (ref 0.0–0.5)
Eosinophils Relative: 0 %
HCT: 42.8 % (ref 36.0–46.0)
Hemoglobin: 14.3 g/dL (ref 12.0–15.0)
Immature Granulocytes: 1 %
Lymphocytes Relative: 12 %
Lymphs Abs: 1.6 10*3/uL (ref 0.7–4.0)
MCH: 31 pg (ref 26.0–34.0)
MCHC: 33.4 g/dL (ref 30.0–36.0)
MCV: 92.6 fL (ref 80.0–100.0)
Monocytes Absolute: 0.5 10*3/uL (ref 0.1–1.0)
Monocytes Relative: 4 %
Neutro Abs: 10.4 10*3/uL — ABNORMAL HIGH (ref 1.7–7.7)
Neutrophils Relative %: 83 %
Platelets: 226 10*3/uL (ref 150–400)
RBC: 4.62 MIL/uL (ref 3.87–5.11)
RDW: 12.3 % (ref 11.5–15.5)
WBC: 12.6 10*3/uL — ABNORMAL HIGH (ref 4.0–10.5)
nRBC: 0 % (ref 0.0–0.2)

## 2018-07-23 LAB — COMPREHENSIVE METABOLIC PANEL
ALT: 18 U/L (ref 0–44)
AST: 30 U/L (ref 15–41)
Albumin: 4.2 g/dL (ref 3.5–5.0)
Alkaline Phosphatase: 56 U/L (ref 38–126)
Anion gap: 15 (ref 5–15)
BUN: 12 mg/dL (ref 6–20)
CO2: 22 mmol/L (ref 22–32)
Calcium: 9.5 mg/dL (ref 8.9–10.3)
Chloride: 104 mmol/L (ref 98–111)
Creatinine, Ser: 0.96 mg/dL (ref 0.44–1.00)
GFR calc Af Amer: >60 mL/min (ref >60)
GFR calc non Af Amer: >60 mL/min (ref >60)
Glucose, Bld: 122 mg/dL — ABNORMAL HIGH (ref 70–99)
Potassium: 3.4 mmol/L — ABNORMAL LOW (ref 3.5–5.1)
Sodium: 141 mmol/L (ref 135–145)
Total Bilirubin: 0.5 mg/dL (ref 0.3–1.2)
Total Protein: 7.1 g/dL (ref 6.5–8.1)

## 2018-07-23 LAB — URINALYSIS, ROUTINE W REFLEX MICROSCOPIC
Bilirubin Urine: NEGATIVE
Glucose, UA: NEGATIVE mg/dL
Hgb urine dipstick: NEGATIVE
Ketones, ur: 20 mg/dL — AB
Leukocytes,Ua: NEGATIVE
Nitrite: NEGATIVE
Protein, ur: NEGATIVE mg/dL
Specific Gravity, Urine: 1.012 (ref 1.005–1.030)
pH: 8 (ref 5.0–8.0)

## 2018-07-23 LAB — LIPASE, BLOOD: Lipase: 27 U/L (ref 11–51)

## 2018-07-23 LAB — I-STAT BETA HCG BLOOD, ED (MC, WL, AP ONLY): I-stat hCG, quantitative: <5 m[IU]/mL (ref <5)

## 2018-07-23 MED ORDER — FENTANYL CITRATE (PF) 100 MCG/2ML IJ SOLN
50.0000 ug | Freq: Once | INTRAMUSCULAR | Status: AC
  Administered 2018-07-23: 20:00:00 50 ug via INTRAVENOUS
  Filled 2018-07-23: qty 2

## 2018-07-23 MED ORDER — ONDANSETRON 4 MG PO TBDP: 4.0000 mg | ORAL_TABLET | Freq: Three times a day (TID) | ORAL | 0 refills | Status: DC | PRN

## 2018-07-23 MED ORDER — TRAMADOL HCL 50 MG PO TABS
50.0000 mg | ORAL_TABLET | Freq: Once | ORAL | Status: AC
  Administered 2018-07-23: 50 mg via ORAL
  Filled 2018-07-23: qty 1

## 2018-07-23 MED ORDER — POTASSIUM CHLORIDE CRYS ER 20 MEQ PO TBCR
40.0000 meq | EXTENDED_RELEASE_TABLET | Freq: Once | ORAL | Status: AC
  Administered 2018-07-23: 40 meq via ORAL
  Filled 2018-07-23: qty 2

## 2018-07-23 MED ORDER — CAPSAICIN 0.025 % EX CREA
TOPICAL_CREAM | Freq: Once | CUTANEOUS | Status: AC
  Administered 2018-07-23: 21:00:00 via TOPICAL
  Filled 2018-07-23: qty 60

## 2018-07-23 MED ORDER — FAMOTIDINE IN NACL 20-0.9 MG/50ML-% IV SOLN
20.0000 mg | Freq: Once | INTRAVENOUS | Status: AC
  Administered 2018-07-23: 20 mg via INTRAVENOUS
  Filled 2018-07-23: qty 50

## 2018-07-23 MED ORDER — LACTATED RINGERS IV BOLUS
1000.0000 mL | Freq: Once | INTRAVENOUS | Status: AC
  Administered 2018-07-23: 1000 mL via INTRAVENOUS

## 2018-07-23 MED ORDER — PROMETHAZINE HCL 25 MG/ML IJ SOLN
12.5000 mg | Freq: Once | INTRAMUSCULAR | Status: AC
  Administered 2018-07-23: 12.5 mg via INTRAVENOUS
  Filled 2018-07-23: qty 1

## 2018-07-23 MED ORDER — PROMETHAZINE HCL 25 MG RE SUPP: 25.0000 mg | Freq: Four times a day (QID) | RECTAL | 0 refills | Status: DC | PRN

## 2018-07-23 MED ORDER — OXYCODONE-ACETAMINOPHEN 5-325 MG PO TABS
1.0000 | ORAL_TABLET | Freq: Once | ORAL | Status: AC
  Administered 2018-07-23: 1 via ORAL
  Filled 2018-07-23: qty 1

## 2018-07-23 MED ORDER — ONDANSETRON HCL 4 MG/2ML IJ SOLN
4.0000 mg | Freq: Once | INTRAMUSCULAR | Status: AC
  Administered 2018-07-23: 4 mg via INTRAVENOUS
  Filled 2018-07-23: qty 2

## 2018-07-23 NOTE — ED Provider Notes (Addendum)
[facility] EMERGENCY DEPARTMENT Provider Note   CSN: [id number] Arrival date & time: 07/23/18  1736    History   Chief Complaint Chief Complaint  Patient presents with   Emesis    HPI Brittany Mullins is a 38 y.o. female.     HPI   Patient is a 38 year old female with past medical history of Addison's disease, hypertension, nephrolithiasis, PCOS presenting for left flank pain and nausea and vomiting.  Patient describes the pain as sharp and constant.  Patient reports that the pain began suddenly 2 hours ago and the nausea vomiting began at the same time.  Denies bilious or bloody emesis.  She does report she has had soft stools.  She denies any fever or chills.  Patient does report that she uses daily cannabis, however she is never been diagnosed with cannabis hyperemesis and reports that this is not typical of her cyclical vomiting episodes.  No remedies prior to arrival for symptoms.  Past Medical History:  Diagnosis Date   Addison's disease ([place])    Brain tumor (benign) ([place])    Hypertension    Marijuana use    Per pt: "medical marijuana patient"   Pancreatitis    PCOS (polycystic ovarian syndrome)    Pituitary tumor    Seizures [facility])     Patient Active Problem List   Diagnosis Date Noted   History of Addison's disease    Intractable cyclical vomiting with nausea    Opioid dependence ([place]) 06/03/2017   Generalized abdominal pain    Hypomagnesemia    Marijuana use     Past Surgical History:  Procedure Laterality Date   ABDOMINAL HYSTERECTOMY     APPENDECTOMY     CESAREAN SECTION     CHOLECYSTECTOMY     ELBOW SURGERY     kidney stent     pancreatic stent       OB History   No obstetric history on file.      Home Medications    Prior to Admission medications   Medication Sig Start Date End Date Taking? Authorizing Provider  acetaminophen (TYLENOL) 500 MG tablet Take 1,000 mg by mouth every 6 (six) hours as  needed for headache.    [provider]  cloNIDine (CATAPRES) 0.2 MG tablet TAKE 1 TABLET(0.2 MG) BY MOUTH THREE TIMES DAILY 05/19/18   Rutherford Guys, MD  LESSINA-28 0.1-20 MG-MCG tablet Take 1 tablet by mouth daily. 11/20/17   Tereasa Coop, PA-C  ondansetron (ZOFRAN) 4 MG tablet Take 4 mg by mouth every 8 (eight) hours as needed for nausea or vomiting.    [provider]  ondansetron (ZOFRAN-ODT) 4 MG disintegrating tablet Take 1 tablet (4 mg total) by mouth every 6 (six) hours as needed. 06/05/17   Isaac Bliss, Rayford Halsted, MD  pantoprazole (PROTONIX) 40 MG tablet Take 1 tablet (40 mg total) by mouth 2 (two) times daily before a meal. 06/05/17   Isaac Bliss, Rayford Halsted, MD  promethazine (PHENERGAN) 25 MG tablet Take 1 tablet (25 mg total) by mouth every 8 (eight) hours as needed for nausea or vomiting. 01/04/18   Rutherford Guys, MD  promethazine-dextromethorphan (PROMETHAZINE-DM) 6.25-15 MG/5ML syrup Take by mouth 4 (four) times daily as needed for cough.    [provider]  SUMAtriptan (IMITREX) 6 MG/0.5ML SOLN injection Inject 6 mg into the skin every 2 (two) hours as needed for migraine or headache. May repeat in 2 hours if headache persists or recurs.  [provider]    Family History Family History  Problem Relation Age of Onset   Cancer Mother    Hypertension Mother    Diabetes Maternal Grandmother    Diabetes Maternal Grandfather     Social History Social History   Tobacco Use   Smoking status: Never Smoker   Smokeless tobacco: Never Used  Substance Use Topics   Alcohol use: No    Frequency: Never   Drug use: Yes    Types: Marijuana     Allergies   Bee venom; Toradol [ketorolac tromethamine]; and Benadryl [diphenhydramine]   Review of Systems Review of Systems  Constitutional: Negative for chills and fever.  HENT: Negative for congestion.   Gastrointestinal: Positive for abdominal pain, nausea and vomiting.    Genitourinary: Positive for flank pain.  All other systems reviewed and are negative.    Physical Exam Updated Vital Signs BP (!) 182/80    Pulse 68    Temp 99.3 F (37.4 C) (Oral)    SpO2 98%   Physical Exam Vitals signs and nursing note reviewed.  Constitutional:      General: She is not in acute distress.    Appearance: She is well-developed. She is not ill-appearing or diaphoretic.     Comments: Appears uncomfortable.   HENT:     Head: Normocephalic and atraumatic.  Eyes:     Conjunctiva/sclera: Conjunctivae normal.     Pupils: Pupils are equal, round, and reactive to light.  Neck:     Musculoskeletal: Normal range of motion and neck supple.  Cardiovascular:     Rate and Rhythm: Normal rate and regular rhythm.     Heart sounds: S1 normal and S2 normal. No murmur.  Pulmonary:     Effort: Pulmonary effort is normal.     Breath sounds: Normal breath sounds. No wheezing or rales.  Abdominal:     General: There is no distension.     Palpations: Abdomen is soft.     Tenderness: There is abdominal tenderness. There is no guarding.     Comments: +Left CVA TTP.   Musculoskeletal: Normal range of motion.        General: No deformity.  Lymphadenopathy:     Cervical: No cervical adenopathy.  Skin:    General: Skin is warm and dry.     Findings: No erythema or rash.  Neurological:     Mental Status: She is alert.     Comments: Cranial nerves grossly intact. Patient moves extremities symmetrically and with good coordination.  Psychiatric:        Behavior: Behavior normal.        Thought Content: Thought content normal.        Judgment: Judgment normal.      ED Treatments / Results  Labs (all labs ordered are listed, but only abnormal results are displayed) Labs Reviewed  CBC WITH DIFFERENTIAL/PLATELET - Abnormal; Notable for the following components:      Result Value   WBC 12.6 (*)    Neutro Abs 10.4 (*)    All other components within normal limits   COMPREHENSIVE METABOLIC PANEL - Abnormal; Notable for the following components:   Potassium 3.4 (*)    Glucose, Bld 122 (*)    All other components within normal limits  URINALYSIS, ROUTINE W REFLEX MICROSCOPIC - Abnormal; Notable for the following components:   Ketones, ur 20 (*)    All other components within normal limits  LIPASE, BLOOD  I-STAT BETA HCG BLOOD, ED (  [facility], [facility], [facility] ONLY)    EKG None  Radiology Ct Renal Stone Study  Result Date: 07/23/2018 CLINICAL DATA:  Left upper abdominal pain EXAM: CT ABDOMEN AND PELVIS WITHOUT CONTRAST TECHNIQUE: Multidetector CT imaging of the abdomen and pelvis was performed following the standard protocol without IV contrast. COMPARISON:  06/02/2017, 12/21/2017 FINDINGS: Lower chest: Lung bases demonstrate no acute consolidation or effusion. Heart size upper normal. Small hiatal hernia Hepatobiliary: No focal liver abnormality is seen. Status post cholecystectomy. No biliary dilatation. Pancreas: Unremarkable. No pancreatic ductal dilatation or surrounding inflammatory changes. Spleen: Normal in size without focal abnormality. Multiple calcified granuloma Adrenals/Urinary Tract: Adrenal glands are normal. Multiple intrarenal stones bilaterally, punctate on the right and measuring up to 5 mm on the left. Mild stranding around the left renal pelvis. Mild left hydronephrosis and hydroureter without definitive ureteral stone. 3 mm calcification along the right posterior bladder, may reflect a small bladder stone or bladder wall calcification. Stomach/Bowel: Stomach is within normal limits. Appendix not seen but no right lower quadrant inflammatory process. No evidence of bowel wall thickening, distention, or inflammatory changes. Vascular/Lymphatic: Nonaneurysmal aorta. No significantly enlarged lymph nodes Reproductive: Status post hysterectomy. No adnexal masses. Other: No free air or free fluid Musculoskeletal: Degenerative changes. No acute or suspicious  abnormality IMPRESSION: 1. Mild left hydronephrosis with mild hazy edema/soft tissue stranding about the left renal pelvis. No definitive ureteral stone is visualized. Findings could be secondary to recently passed stone versus ascending urinary tract infection. Note that there is a 3 mm calcification along the right posterior bladder which may reflect a small stone in the bladder versus small calcification at the bladder wall. 2. There are small intrarenal stones bilaterally. Electronically Signed   By: Donavan Foil M.D.   On: 07/23/2018 20:36    Procedures Procedures (including critical care time)  Medications Ordered in ED Medications  famotidine (PEPCID) IVPB 20 mg premix (has no administration in time range)  lactated ringers bolus 1,000 mL (1,000 mLs Intravenous New Bag/Given 07/23/18 1950)  ondansetron (ZOFRAN) injection 4 mg (4 mg Intravenous Given 07/23/18 1949)  capsaicin (ZOSTRIX) 0.025 % cream ( Topical Given 07/23/18 2030)  fentaNYL (SUBLIMAZE) injection 50 mcg (50 mcg Intravenous Given 07/23/18 2001)     Initial Impression / Assessment and Plan / ED Course  I have reviewed the triage vital signs and the nursing notes.  Pertinent labs & imaging results that were available during my care of the patient were reviewed by me and considered in my medical decision making (see chart for details).        Patient is nontoxic-appearing, afebrile, and has a nonsurgical abdomen.  Differential diagnosis includes cannabis hyperemesis, gastroenteritis, pyelonephritis, nephrolithiasis, diverticulitis.  Given sudden onset nature of patient's pain, and history of nephrolithiasis, will obtain CT renal stone study.  Work-up showing mild leukocytosis of 12.6.  Mild hypokalemia of 3.4, will replete.  Urinalysis is clear without evidence of infection.  Patient is not pregnant.  CT renal stone study demonstrating mild left hydronephrosis and stranding consistent with either a sending urinary tract  infection or recently passed stone.  Given that her urine is entirely clean, feel that recently passed stone more likely.  Patient is comfortable on my second reevaluation, and likely has passed the stone.  Will p.o. challenge.  Care signed out to Dr. Carmin Muskrat to assess after PO challenge.   Of note, blood pressure elevated today.  Patient reports that she sees [facility] for primary care.  Instructed patient  to follow-up regarding her blood pressure.  She is not currently on antihypertensives.  Feel that elevated blood pressure related to vomiting today. No signs/symptoms of HTN urgency/emergency or increased intracranial pressure.   Final Clinical Impressions(s) / ED Diagnoses   Final diagnoses:  Nephrolithiasis  Non-intractable vomiting with nausea, unspecified vomiting type    ED Discharge Orders    None       Tamala Julian 07/23/18 2220    Tamala Julian 07/23/18 2311    Carmin Muskrat, MD 07/23/18 2316

## 2018-07-23 NOTE — ED Notes (Signed)
Patient verbalizes understanding of discharge instructions. Opportunity for questioning and answers were provided. Armband removed by staff, pt discharged from ED via wheelchair to home.  

## 2018-07-23 NOTE — ED Notes (Signed)
Pt is being uncooperative at discharge.

## 2018-07-23 NOTE — ED Notes (Signed)
Pt at this is being uncooperative with staff.

## 2018-07-23 NOTE — ED Triage Notes (Signed)
[facility]- from home. NVD x 2 hours. Started after she had lunch. Pt also smoked marijuana. Pt is currently been treated for kidney infection and in the past has hd NVD with this. Pt is having sharp left upper abdominal pain. Pt uncooperative with EMS.   180/98 80 HR 100 RA 98.1 temp

## 2018-07-23 NOTE — ED Notes (Signed)
Pt states she cant walk to bathroom. Pt placed on bedpan and urine collected.

## 2018-07-23 NOTE — Discharge Instructions (Addendum)
Please read and follow all provided instructions.  Your diagnoses today include:  1. Nephrolithiasis   2. Non-intractable vomiting with nausea, unspecified vomiting type     Tests performed today include: Urine test that showed blood in your urine and no infection CT scan showed a recently passed kidney stone.  Blood test that showed normal kidney function Vital signs. See below for your results today.   Medications prescribed:   Take any prescribed medications only as directed.   Please take Zofran every 8 hours as needed for nausea or vomiting.  I also prescribed some rectal Phenergan if you are unable to keep down Zofran.  Please do not take both at the same time and only take the Phenergan rectally if you are unable to tolerate Zofran.  Sometimes daily cannabis use can cause a cyclical vomiting syndrome.  We will can help with this is applying the capsaicin cream in a thin layer over your abdomen. Do not apply to broken skin.   Home care instructions:  Follow any educational materials contained in this packet.  Please double your fluid intake for the next several days. Strain your urine and save any stones that may pass.    Follow-up instructions: Please follow-up with your urologist or the urologist referral (provided on front page) in the next 1 week for further evaluation of your symptoms.  If you need to return to the Emergency Department, go to [facility] and not [facility]. The urologists are located at [facility] and can better care for you at this location.  Return instructions:   Please return to the Emergency Department if you experience worsening symptoms.  Please return if you develop fever or uncontrolled pain or vomiting. Please return if you have any other emergent concerns.  Additional Information:  Your vital signs today were: BP (!) 165/96    Pulse 71    Temp 99.3 F (37.4 C) (Oral)    SpO2 95%  If your blood pressure (BP) was  elevated above 135/85 this visit, please have this repeated by your doctor within one month. --------------

## 2018-07-23 NOTE — ED Notes (Signed)
Pt given water and graham crackers 

## 2018-07-23 NOTE — ED Notes (Signed)
Pt refusing to get in gown and continues to say "I just need a blanket."

## 2018-07-23 NOTE — ED Notes (Signed)
Attempted IV start per request of primary RN due to unsuccessful attempts by both himself and another RN, this RN attempted x 2 without success. IV consult to be placed

## 2018-07-24 ENCOUNTER — Other Ambulatory Visit: Payer: Self-pay | Admitting: Family Medicine

## 2018-07-29 ENCOUNTER — Telehealth: Payer: Self-pay | Admitting: *Deleted

## 2018-07-29 NOTE — Telephone Encounter (Signed)
NCM spoke to pt and states her insurance is not covering Promethazine. Explained NCM received call from [facility] stating it was not covered. Contacted Kelsie Ward PA to follow up another suppository, order given for Compazine 25 mg q 12 hours as needed 10 tabs. Contacted pt's pharmacy with new Rx and made pt aware of Rx. Pt states she did follow up with PCP on 4/24. [facility] and she can come into office this week to see her PCP if her symptoms are worse. Explained to pt the importance of contacting Urologist to schedule follow up appt. Jonnie Finner RN CCM Case Mgmt phone [phone]  2:32 pm Contacted pharmacy and Compazine is $1.80. Made pt aware. Jonnie Finner RN CCM Case Mgmt phone [phone]

## 2018-11-15 ENCOUNTER — Other Ambulatory Visit: Payer: Self-pay | Admitting: Family Medicine

## 2018-12-02 IMAGING — DX DG CHEST 2V
2 series · 2 of 2 positions shown · non-contrast
Comparison: None.

CLINICAL DATA: Chest pain and vomiting today.

EXAM:
CHEST  2 VIEW

[chest lat]
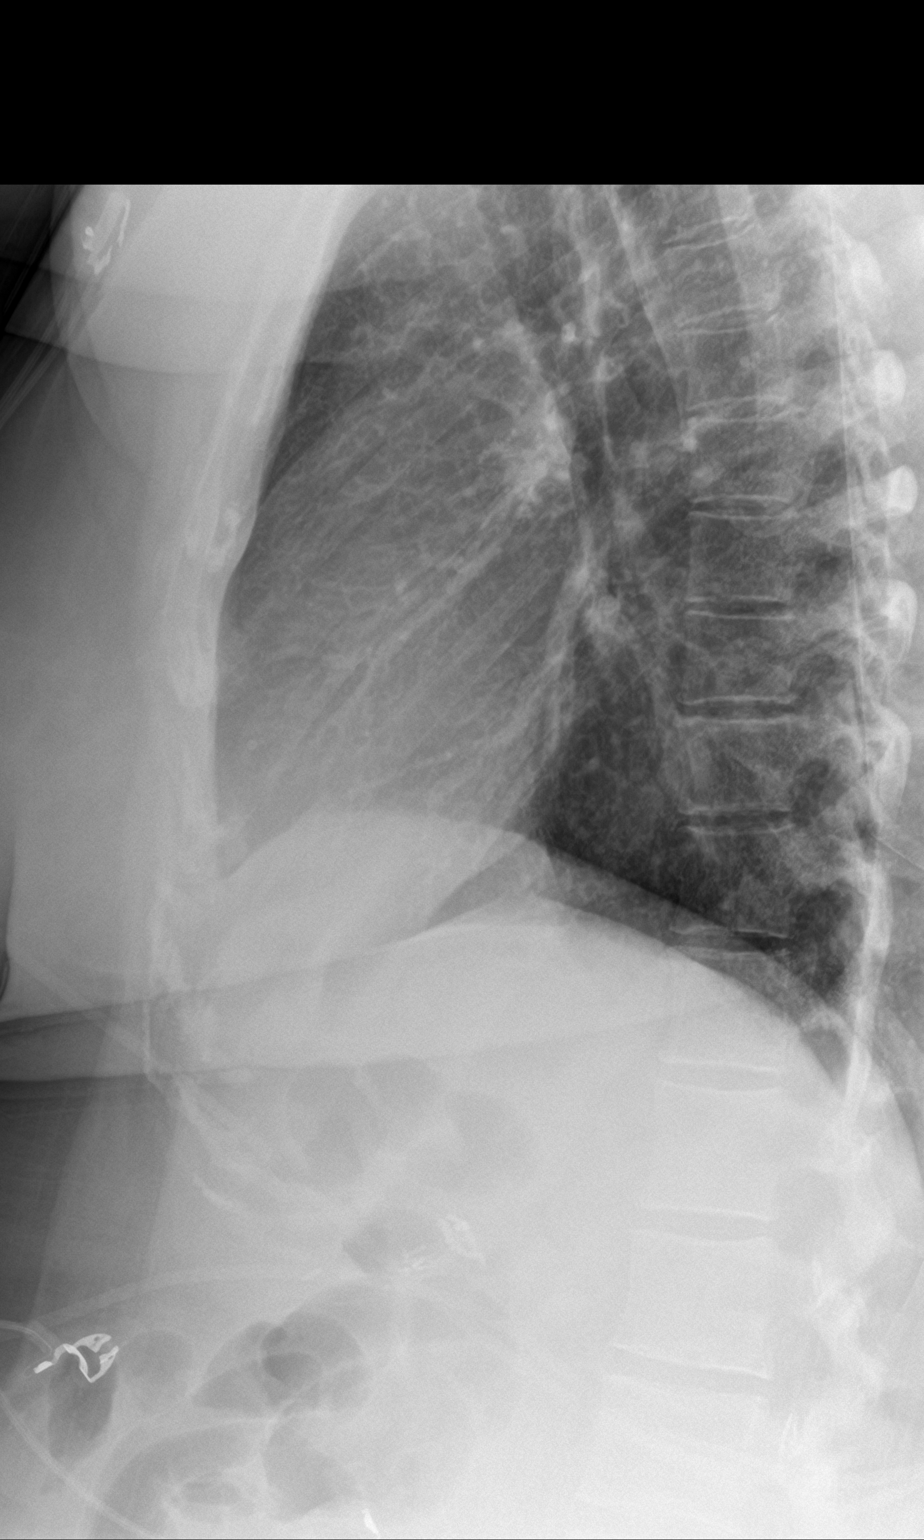

[chest ap]
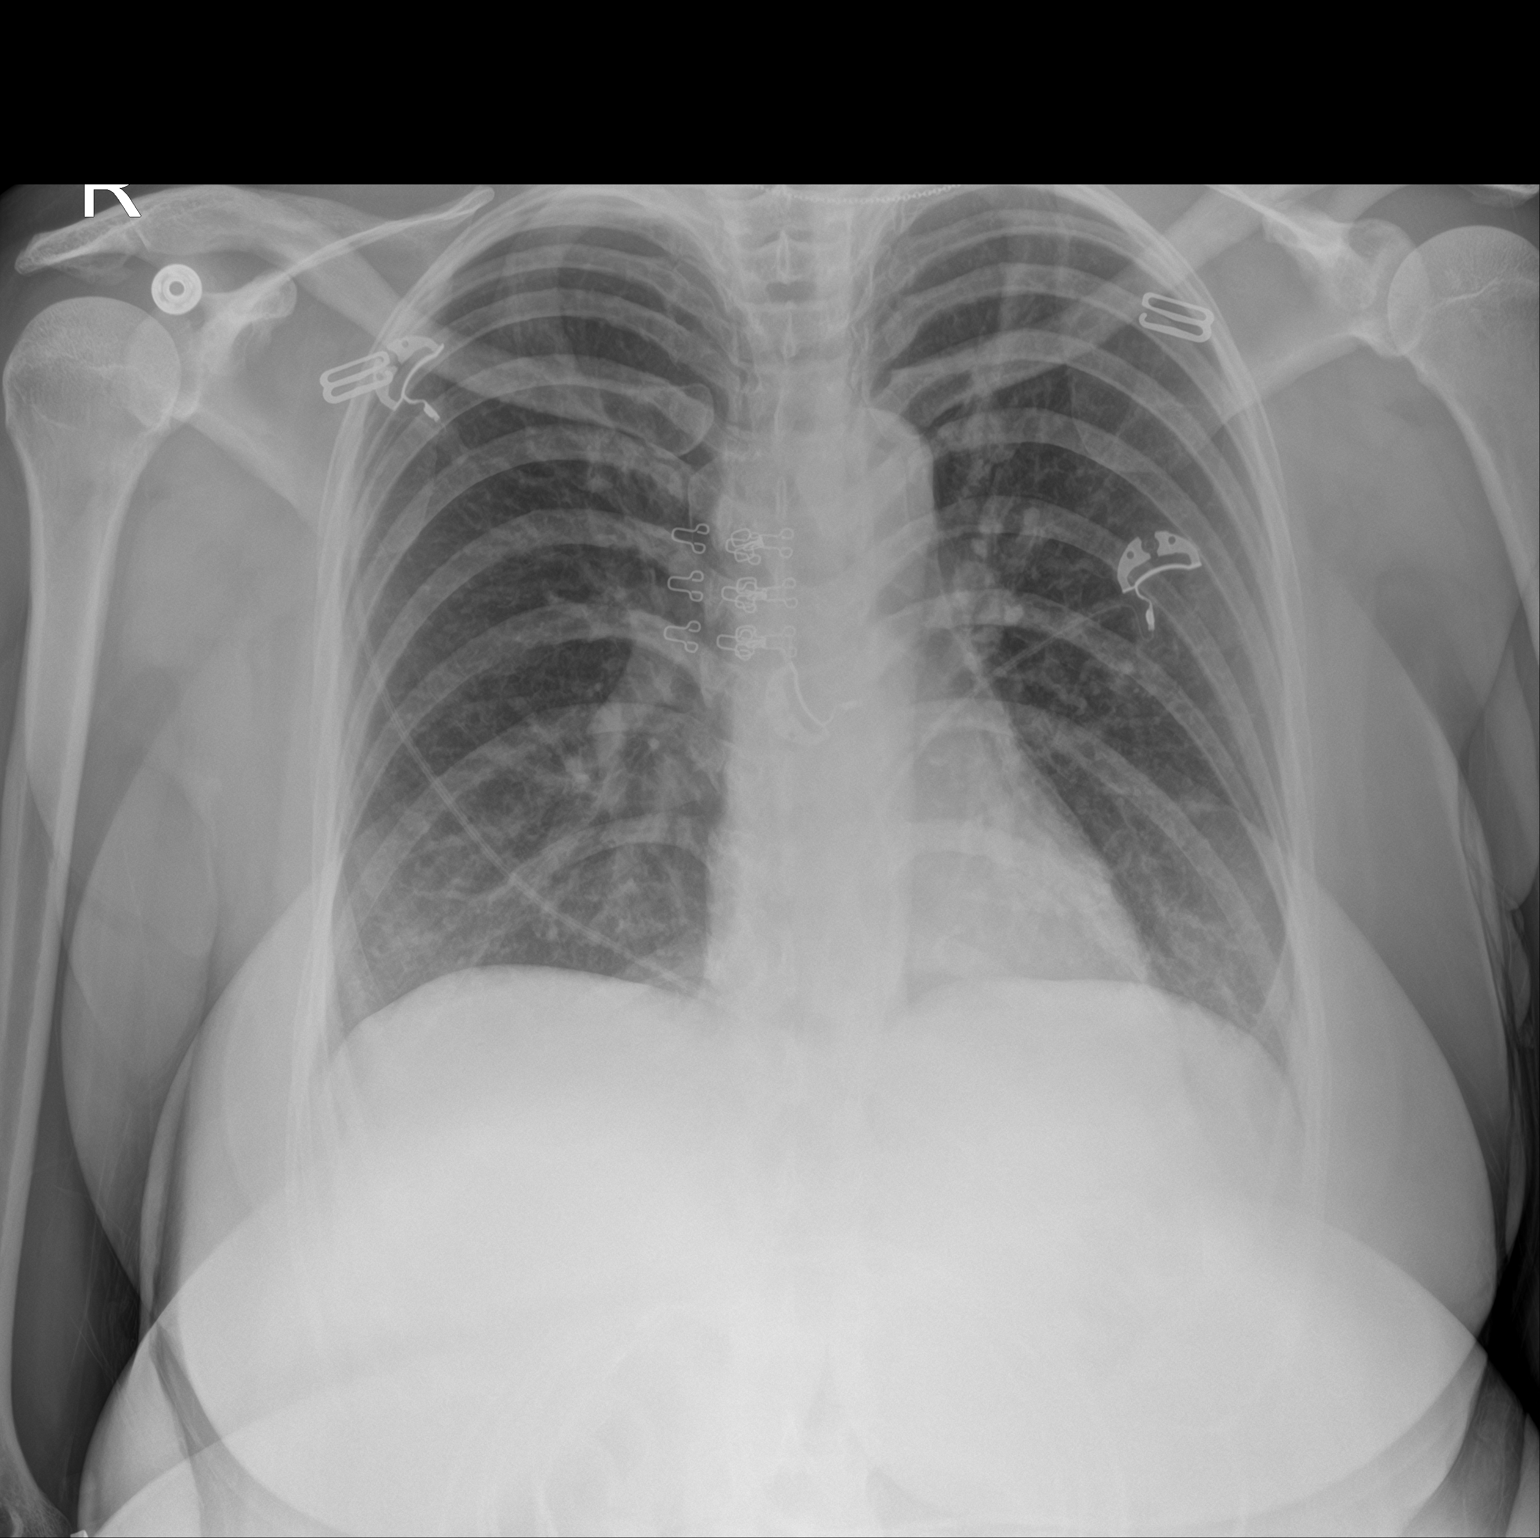

[2 of 2 positions shown; findings below may reference images not displayed]

FINDINGS: The heart size and mediastinal contours are within normal limits.
Both lungs are clear. The visualized skeletal structures are
unremarkable.
IMPRESSION: No active cardiopulmonary disease.

## 2018-12-02 IMAGING — CT CT HEAD W/O CM
4 series · 17 of 47 positions shown, 19 images · non-contrast
Comparison: None.

CLINICAL DATA: Posterior headache, nausea, vomiting x3 weeks

EXAM:
CT HEAD WITHOUT CONTRAST
TECHNIQUE: Contiguous axial images were obtained from the base of the skull
through the vertex without intravenous contrast.

[Series 2: head trauma wo · axial · 0.42mm/px · z∈[+390,+505]mm · 7 of 31 slices shown, 9 images]
[im 4/31  brain]
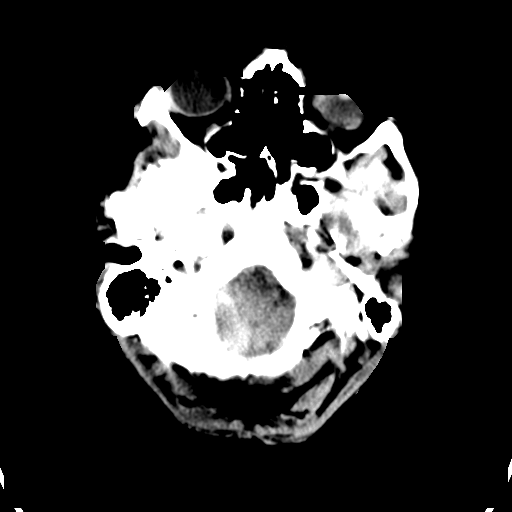
[im 4/31  bone]
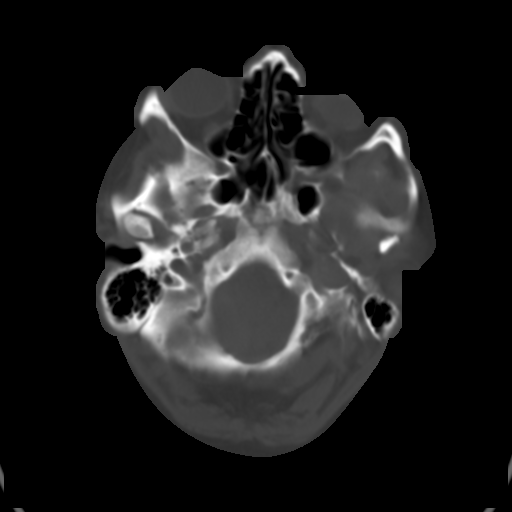
[im 8/31  brain]
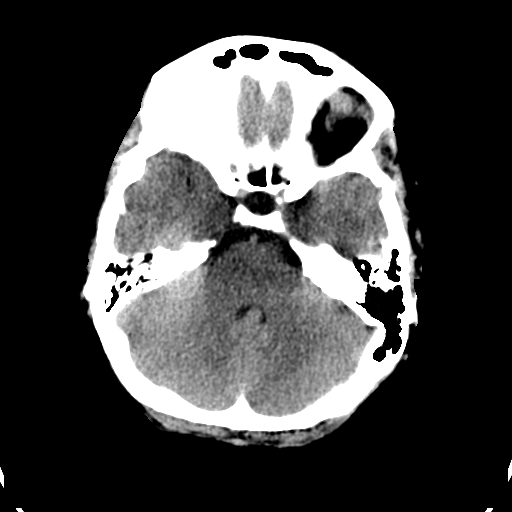
[im 12/31  brain]
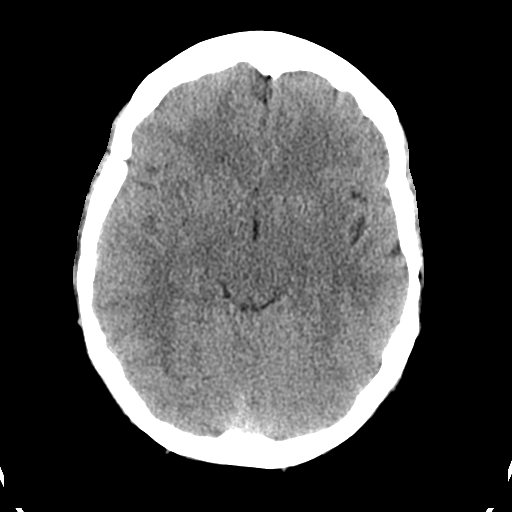
[im 16/31  brain]
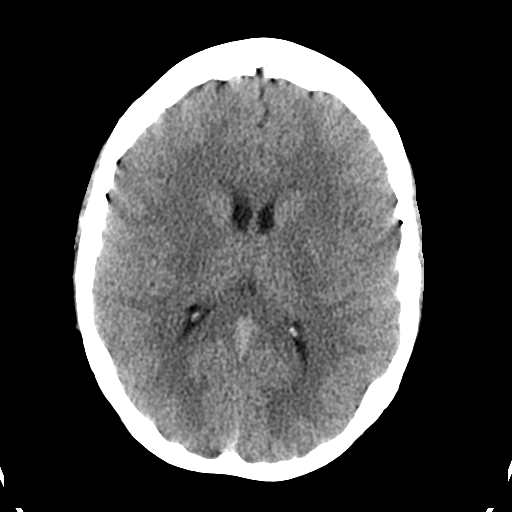
[im 19/31  brain]
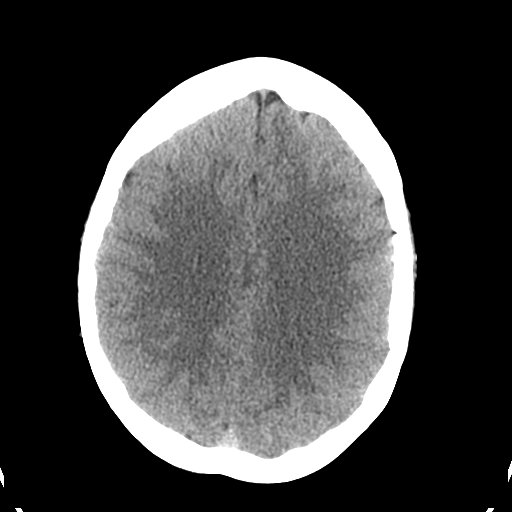
[im 19/31  bone]
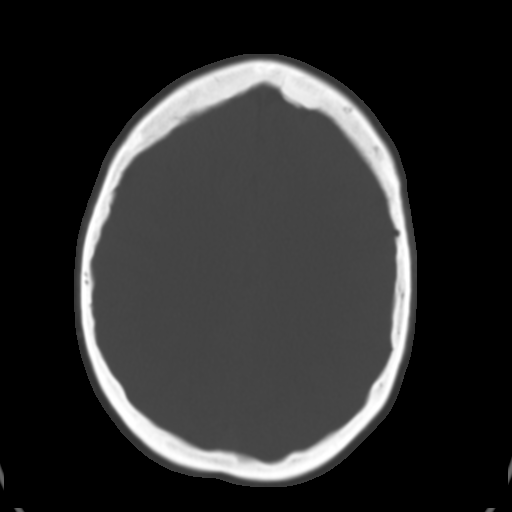
[im 23/31  brain]
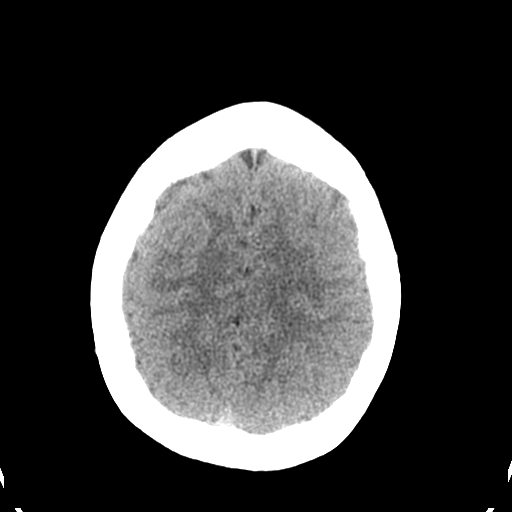
[im 27/31  brain]
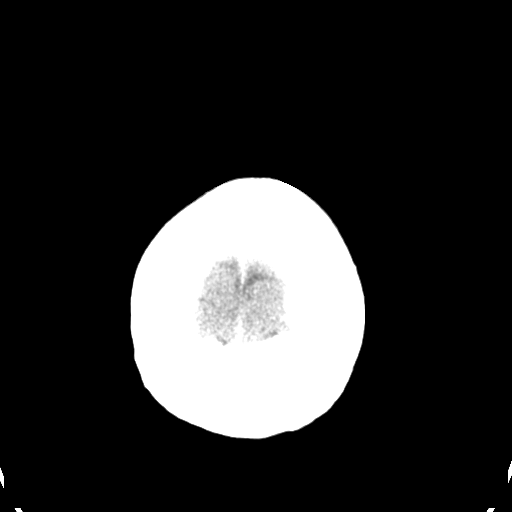

[Series 3: head bone · axial · 0.42mm/px · z∈[+389,+443]mm · 4 of 78 slices shown]
[im 8/78  bone]
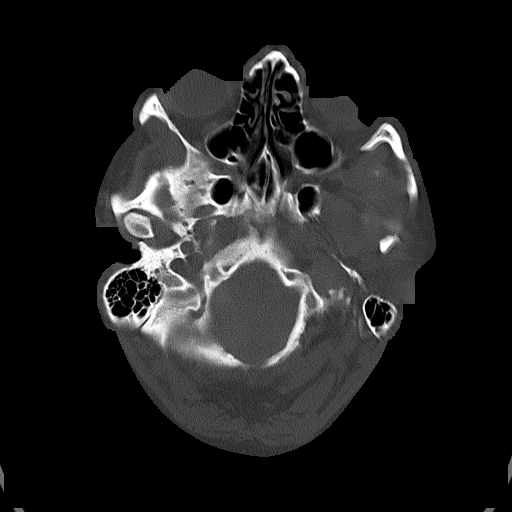
[im 16/78  bone]
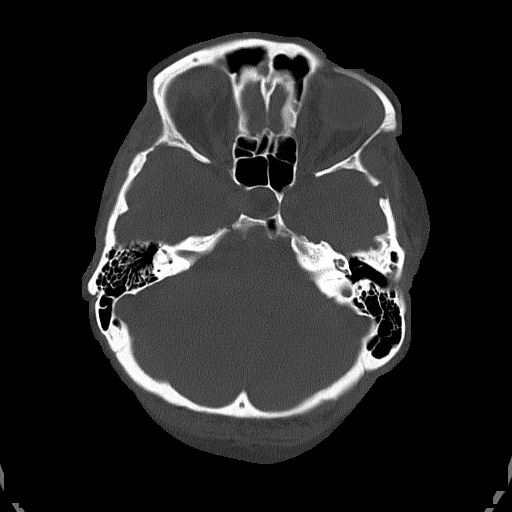
[im 24/78  bone]
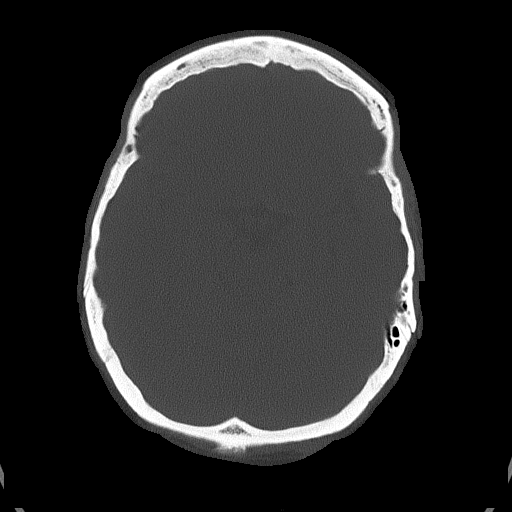
[im 35/78  bone]
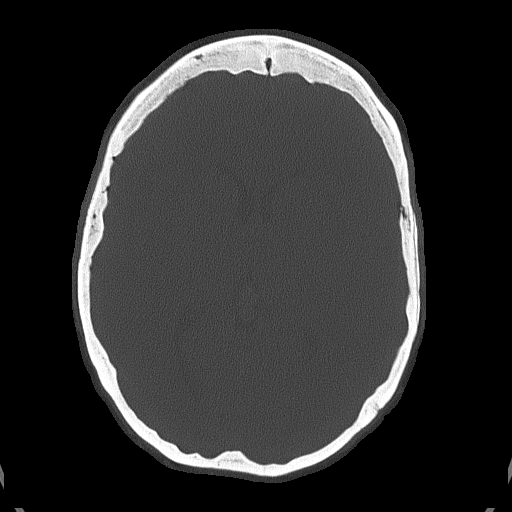

[Series 4: coronal soft tissue · coronal · 0.34mm/px · 3 of 71 slices shown]
[im 24/71  brain]
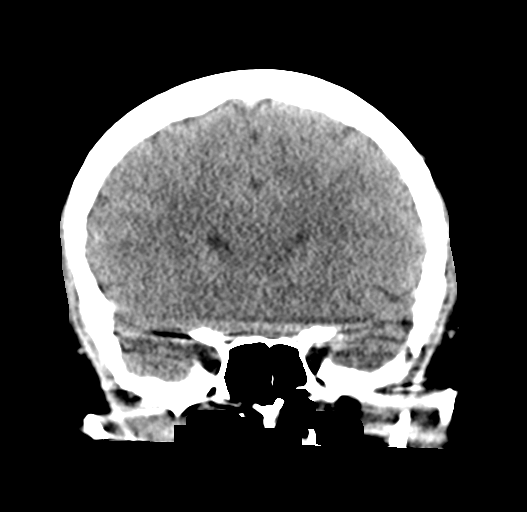
[im 32/71  brain]
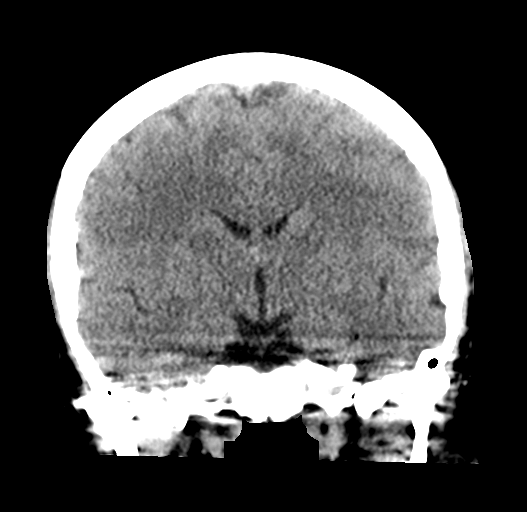
[im 39/71  brain]
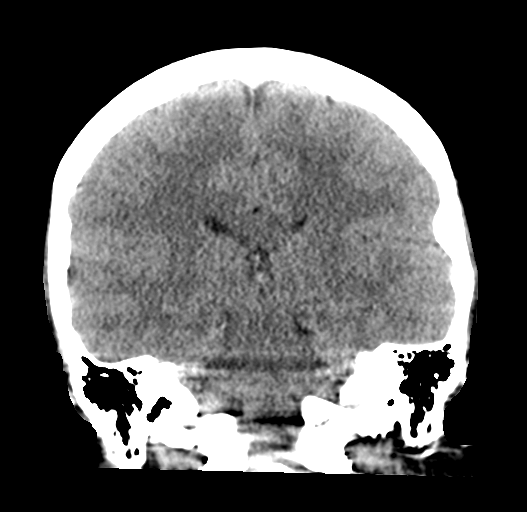

[Series 5: sagittal soft tissue · sagittal · 0.35mm/px · 3 of 57 slices shown]
[im 19/57  brain]
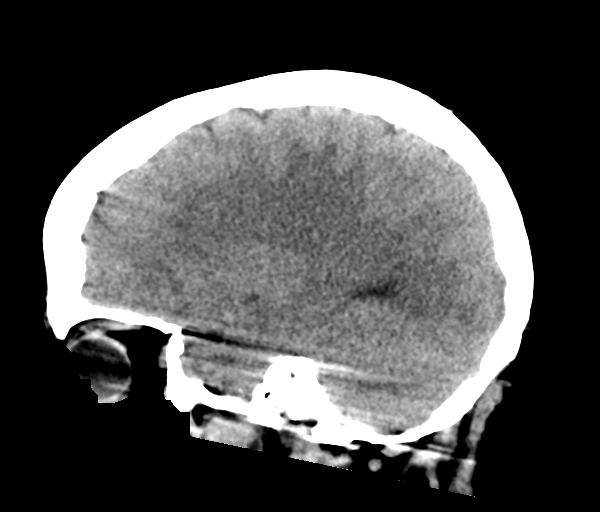
[im 29/57  brain]
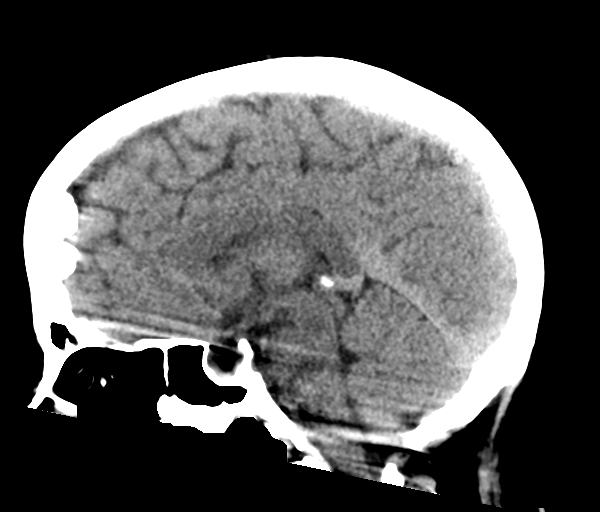
[im 38/57  brain]
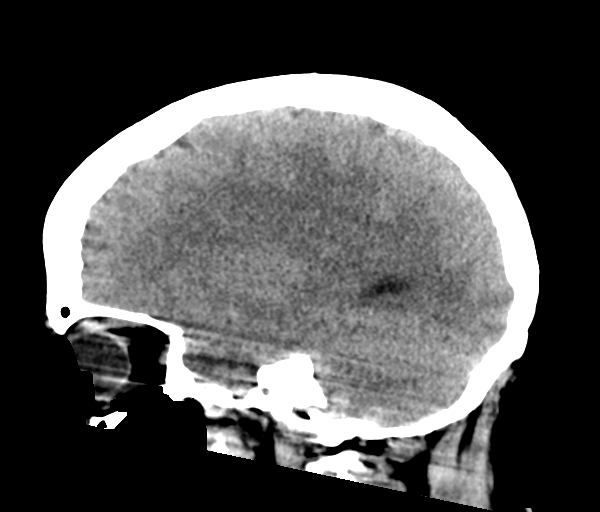

[17 of 47 positions shown; findings below may reference images not displayed]

FINDINGS: Brain: No evidence of acute infarction, hemorrhage, hydrocephalus,
extra-axial collection or mass lesion/mass effect.

Vascular: No hyperdense vessel or unexpected calcification.

Skull: Normal. Negative for fracture or focal lesion.

Sinuses/Orbits: No acute finding.

Other: None.
IMPRESSION: Negative

## 2018-12-24 ENCOUNTER — Other Ambulatory Visit: Payer: Self-pay

## 2018-12-24 ENCOUNTER — Encounter (HOSPITAL_COMMUNITY): Payer: Self-pay | Admitting: Emergency Medicine

## 2018-12-24 ENCOUNTER — Ambulatory Visit (HOSPITAL_COMMUNITY)
Admission: EM | Admit: 2018-12-24 | Discharge: 2018-12-24 | Disposition: A | Discharge status: Home / Self Care | Payer: Medicare Other | Attending: Family Medicine | Admitting: Family Medicine

## 2018-12-24 DIAGNOSIS — Z8249 Family history of ischemic heart disease and other diseases of the circulatory system: Secondary | ICD-10-CM | POA: Insufficient documentation

## 2018-12-24 DIAGNOSIS — Z91030 Bee allergy status: Secondary | ICD-10-CM | POA: Insufficient documentation

## 2018-12-24 DIAGNOSIS — Z809 Family history of malignant neoplasm, unspecified: Secondary | ICD-10-CM | POA: Insufficient documentation

## 2018-12-24 DIAGNOSIS — Z888 Allergy status to other drugs, medicaments and biological substances status: Secondary | ICD-10-CM | POA: Diagnosis not present

## 2018-12-24 DIAGNOSIS — R112 Nausea with vomiting, unspecified: Secondary | ICD-10-CM | POA: Diagnosis present

## 2018-12-24 DIAGNOSIS — Z20828 Contact with and (suspected) exposure to other viral communicable diseases: Secondary | ICD-10-CM | POA: Insufficient documentation

## 2018-12-24 DIAGNOSIS — E271 Primary adrenocortical insufficiency: Secondary | ICD-10-CM | POA: Insufficient documentation

## 2018-12-24 DIAGNOSIS — Z79899 Other long term (current) drug therapy: Secondary | ICD-10-CM | POA: Diagnosis not present

## 2018-12-24 DIAGNOSIS — I10 Essential (primary) hypertension: Secondary | ICD-10-CM | POA: Insufficient documentation

## 2018-12-24 DIAGNOSIS — F1221 Cannabis dependence, in remission: Secondary | ICD-10-CM | POA: Diagnosis not present

## 2018-12-24 DIAGNOSIS — E282 Polycystic ovarian syndrome: Secondary | ICD-10-CM | POA: Diagnosis not present

## 2018-12-24 DIAGNOSIS — Z833 Family history of diabetes mellitus: Secondary | ICD-10-CM | POA: Diagnosis not present

## 2018-12-24 DIAGNOSIS — Z793 Long term (current) use of hormonal contraceptives: Secondary | ICD-10-CM | POA: Insufficient documentation

## 2018-12-24 DIAGNOSIS — F1121 Opioid dependence, in remission: Secondary | ICD-10-CM | POA: Insufficient documentation

## 2018-12-24 DIAGNOSIS — Z9049 Acquired absence of other specified parts of digestive tract: Secondary | ICD-10-CM | POA: Diagnosis not present

## 2018-12-24 DIAGNOSIS — Z90710 Acquired absence of both cervix and uterus: Secondary | ICD-10-CM | POA: Diagnosis not present

## 2018-12-24 DIAGNOSIS — R300 Dysuria: Secondary | ICD-10-CM | POA: Diagnosis present

## 2018-12-24 LAB — POCT URINALYSIS DIP (DEVICE)
Glucose, UA: NEGATIVE mg/dL
Ketones, ur: NEGATIVE mg/dL
Nitrite: NEGATIVE
Protein, ur: 100 mg/dL — AB
Specific Gravity, Urine: >=1.03 (ref 1.005–1.030)
Urobilinogen, UA: 0.2 mg/dL (ref 0.0–1.0)
pH: 6 (ref 5.0–8.0)

## 2018-12-24 LAB — POCT RAPID STREP A: Streptococcus, Group A Screen (Direct): NEGATIVE

## 2018-12-24 MED ORDER — CIPROFLOXACIN HCL 500 MG PO TABS: 500.0000 mg | ORAL_TABLET | Freq: Two times a day (BID) | ORAL | 0 refills | Status: AC

## 2018-12-24 MED ORDER — PROMETHAZINE HCL 25 MG PO TABS: 25.0000 mg | ORAL_TABLET | Freq: Four times a day (QID) | ORAL | 0 refills | Status: DC | PRN

## 2018-12-24 MED ORDER — CETIRIZINE HCL 10 MG PO CAPS: 10.0000 mg | ORAL_CAPSULE | Freq: Every day | ORAL | 0 refills | Status: DC

## 2018-12-24 MED ORDER — PHENAZOPYRIDINE HCL 200 MG PO TABS: 200.0000 mg | ORAL_TABLET | Freq: Three times a day (TID) | ORAL | 0 refills | Status: DC

## 2018-12-24 NOTE — Discharge Instructions (Signed)
Strep Negative  COVID pending  Begin cipro twice daily for 1 week for UTI Push fluids Pyridium as needed for burning three times daily Phenergan as needed for nausea and vomiting  Sore Throat  Your rapid strep tested Negative today. We will send for a culture and call in about 2 days if results are positive. For now we will treat your sore throat as a virus with symptom management.   Please continue Tylenol or Ibuprofen for fever and pain. May try salt water gargles, cepacol lozenges, throat spray, or OTC cold relief medicine for throat discomfort. If you also have congestion take a daily anti-histamine like Zyrtec, Claritin, and a oral decongestant to help with post nasal drip that may be irritating your throat.   Stay hydrated and drink plenty of fluids to keep your throat coated relieve irritation.

## 2018-12-24 NOTE — ED Triage Notes (Signed)
Pt presents to [facility] for assessment of sore throat and bilateral ear pain x 3 days, last night she began to have emesis, pain in groin with or without urination.  100.5 temps at home.

## 2018-12-25 LAB — URINE CULTURE

## 2018-12-25 NOTE — ED Provider Notes (Signed)
[facility]    CSN: [id number] Arrival date & time: 12/24/18  1653      History   Chief Complaint Chief Complaint  Patient presents with  . Dysuria    HPI Brittany Mullins is a 38 y.o. female history of Addison's disease, hypertension, marijuana use, PCOS, presenting today for evaluation of fever, abdominal pain, sore throat and dysuria.  Patient states that over the past 3 days she has had a sore throat as well as bilateral ear pain.  She denies coughing.  Denies congestion.  Denies known exposure to COVID or strep.  Over the past 24 hours she has developed a fever, lower abdominal pain vomiting as well as dysuria.  She reports urinary frequency as well as a lot of irritation to her urethra.  States that even with resting she has discomfort in that area.  Denies itching or irritation.  Denies abnormal vaginal discharge.  Has had previous hysterectomy.  Denies concerns for STDs and denies any new partners.  Has previously had UTI related to kidney stone.  Does not feel similar to when she previously had kidney stone, denies back pain.  Fevers up to 100.  HPI  Past Medical History:  Diagnosis Date  . Addison's disease ([place])   . Brain tumor (benign) ([place])   . Hypertension   . Marijuana use    Per pt: "medical marijuana patient"  . Pancreatitis   . PCOS (polycystic ovarian syndrome)   . Pituitary tumor   . Seizures [facility])     Patient Active Problem List   Diagnosis Date Noted  . History of Addison's disease   . Intractable cyclical vomiting with nausea   . Opioid dependence ([place]) 06/03/2017  . Generalized abdominal pain   . Hypomagnesemia   . Marijuana use     Past Surgical History:  Procedure Laterality Date  . ABDOMINAL HYSTERECTOMY    . APPENDECTOMY    . CESAREAN SECTION    . CHOLECYSTECTOMY    . ELBOW SURGERY    . kidney stent    . pancreatic stent      OB History   No obstetric history on file.      Home Medications    Prior to Admission  medications   Medication Sig Start Date End Date Taking? Authorizing Provider  acetaminophen (TYLENOL) 500 MG tablet Take 1,000 mg by mouth every 6 (six) hours as needed for headache.    [provider]  Cetirizine HCl 10 MG CAPS Take 1 capsule (10 mg total) by mouth daily for 10 days. 12/24/18 01/03/19  Roddie Riegler C, PA-C  ciprofloxacin (CIPRO) 500 MG tablet Take 1 tablet (500 mg total) by mouth every 12 (twelve) hours for 7 days. 12/24/18 12/31/18  Jaymond Waage C, PA-C  cloNIDine (CATAPRES) 0.2 MG tablet TAKE 1 TABLET(0.2 MG) BY MOUTH THREE TIMES DAILY 05/19/18   Rutherford Guys, MD  phenazopyridine (PYRIDIUM) 200 MG tablet Take 1 tablet (200 mg total) by mouth 3 (three) times daily. 12/24/18   Nichola Warren C, PA-C  promethazine (PHENERGAN) 25 MG tablet Take 1 tablet (25 mg total) by mouth every 6 (six) hours as needed for nausea or vomiting. 12/24/18   Eliott Amparan C, PA-C  SUMAtriptan (IMITREX) 6 MG/0.5ML SOLN injection Inject 6 mg into the skin every 2 (two) hours as needed for migraine or headache. May repeat in 2 hours if headache persists or recurs.    [provider]  LESSINA-28 0.1-20 MG-MCG tablet Take 1 tablet by  mouth daily. 11/20/17 12/24/18  Tereasa Coop, PA-C  pantoprazole (PROTONIX) 40 MG tablet Take 1 tablet (40 mg total) by mouth 2 (two) times daily before a meal. 06/05/17 12/24/18  Isaac Bliss, Rayford Halsted, MD    Family History Family History  Problem Relation Age of Onset  . Cancer Mother   . Hypertension Mother   . Diabetes Maternal Grandmother   . Diabetes Maternal Grandfather     Social History Social History   Tobacco Use  . Smoking status: Never Smoker  . Smokeless tobacco: Never Used  Substance Use Topics  . Alcohol use: No    Frequency: Never  . Drug use: Yes    Types: Marijuana     Allergies   Bee venom, Toradol [ketorolac tromethamine], and Benadryl [diphenhydramine]   Review of Systems Review of Systems   Constitutional: Positive for fever. Negative for activity change, appetite change, chills and fatigue.  HENT: Positive for ear pain and sore throat. Negative for congestion, rhinorrhea, sinus pressure and trouble swallowing.   Eyes: Negative for discharge and redness.  Respiratory: Negative for cough, chest tightness and shortness of breath.   Cardiovascular: Negative for chest pain.  Gastrointestinal: Positive for abdominal pain, nausea and vomiting. Negative for diarrhea.  Genitourinary: Positive for dysuria, frequency and urgency. Negative for vaginal bleeding, vaginal discharge and vaginal pain.  Musculoskeletal: Negative for myalgias.  Skin: Negative for rash.  Neurological: Negative for dizziness, light-headedness and headaches.     Physical Exam Triage Vital Signs ED Triage Vitals  Enc Vitals Group     BP 12/24/18 1751 125/88     Pulse Rate 12/24/18 1751 (!) 109     Resp 12/24/18 1751 18     Temp 12/24/18 1751 98 F (36.7 C)     Temp Source 12/24/18 1751 Oral     SpO2 12/24/18 1751 96 %     Weight --      Height --      Head Circumference --      Peak Flow --      Pain Score 12/24/18 1752 8     Pain Loc --      Pain Edu? --      Excl. in [place]? --    No data found.  Updated Vital Signs BP 125/88 (BP Location: Left Arm)   Pulse (!) 109   Temp 98 F (36.7 C) (Oral)   Resp 18   SpO2 96%   Visual Acuity Right Eye Distance:   Left Eye Distance:   Bilateral Distance:    Right Eye Near:   Left Eye Near:    Bilateral Near:     Physical Exam Vitals signs and nursing note reviewed.  Constitutional:      General: She is not in acute distress.    Appearance: She is well-developed.     Comments: Appears uncomfortable but no acute distress  HENT:     Head: Normocephalic and atraumatic.     Ears:     Comments: Bilateral ears without tenderness to palpation of external auricle, tragus and mastoid, EAC's without erythema or swelling, TM's with good bony landmarks and  cone of light. Non erythematous.     Mouth/Throat:     Comments: Oral mucosa pink and moist, no tonsillar enlargement or exudate. Posterior pharynx patent and nonerythematous, no uvula deviation or swelling. Normal phonation.  Eyes:     Conjunctiva/sclera: Conjunctivae normal.  Neck:     Musculoskeletal: Neck supple.  Cardiovascular:  Rate and Rhythm: Regular rhythm. Tachycardia present.     Heart sounds: No murmur.  Pulmonary:     Effort: Pulmonary effort is normal. No respiratory distress.     Breath sounds: Normal breath sounds.     Comments: Breathing comfortably at rest, CTABL, no wheezing, rales or other adventitious sounds auscultated Abdominal:     Palpations: Abdomen is soft.     Tenderness: There is abdominal tenderness.     Comments: Soft, nondistended, tender to palpation of bilateral lower quadrants and suprapubic area, no focal tenderness, negative rebound, negative Rovsing, negative McBurney's No CVA tenderness  Skin:    General: Skin is warm and dry.  Neurological:     Mental Status: She is alert.      UC Treatments / Results  Labs (all labs ordered are listed, but only abnormal results are displayed) Labs Reviewed  POCT URINALYSIS DIP (DEVICE) - Abnormal; Notable for the following components:      Result Value   Bilirubin Urine SMALL (*)    Hgb urine dipstick LARGE (*)    Protein, ur 100 (*)    Leukocytes,Ua TRACE (*)    All other components within normal limits  CULTURE, GROUP A STREP ([place])  URINE CULTURE  NOVEL CORONAVIRUS, NAA (HOSP ORDER, SEND-OUT TO REF LAB; TAT 18-24 HRS)  POCT RAPID STREP A    EKG   Radiology No results found.  Procedures Procedures (including critical care time)  Medications Ordered in UC Medications - No data to display  Initial Impression / Assessment and Plan / UC Course  I have reviewed the triage vital signs and the nursing notes.  Pertinent labs & imaging results that were available during my care of the  patient were reviewed by me and considered in my medical decision making (see chart for details).     Strep test negative, COVID pending.  Sore throat and ear discomfort most likely viral and related to congestion/drainage.  Recommending daily cetirizine and continued symptomatic and supportive care of this.  UA with trace leuks, large hemoglobin.  Although trace leuks given clinical symptoms and other abnormalities within UA we will go ahead and treat for UTI, given tachycardia and vomiting will cover for Pyelo with Cipro twice daily x1 week, push fluids, providing Pyridium to help with dysuria, Phenergan for nausea and vomiting per patient request as she states that Zofran typically does not help her.  Will send culture to confirm will call if needing alter antibiotic therapy discussed possibility of kidney stone as well given large hemoglobin, continue to monitor if symptoms persisting and worsening without improvement with today's treatment to follow-up here in the emergency room.Discussed strict return precautions. Patient verbalized understanding and is agreeable with plan.  Final Clinical Impressions(s) / UC Diagnoses   Final diagnoses:  Dysuria  Non-intractable vomiting with nausea, unspecified vomiting type     Discharge Instructions     Strep Negative  COVID pending  Begin cipro twice daily for 1 week for UTI Push fluids Pyridium as needed for burning three times daily Phenergan as needed for nausea and vomiting  Sore Throat  Your rapid strep tested Negative today. We will send for a culture and call in about 2 days if results are positive. For now we will treat your sore throat as a virus with symptom management.   Please continue Tylenol or Ibuprofen for fever and pain. May try salt water gargles, cepacol lozenges, throat spray, or OTC cold relief medicine for throat discomfort. If you  also have congestion take a daily anti-histamine like Zyrtec, Claritin, and a oral  decongestant to help with post nasal drip that may be irritating your throat.   Stay hydrated and drink plenty of fluids to keep your throat coated relieve irritation.     ED Prescriptions    Medication Sig Dispense Auth. Provider   promethazine (PHENERGAN) 25 MG tablet Take 1 tablet (25 mg total) by mouth every 6 (six) hours as needed for nausea or vomiting. 30 tablet Romain Erion C, PA-C   ciprofloxacin (CIPRO) 500 MG tablet Take 1 tablet (500 mg total) by mouth every 12 (twelve) hours for 7 days. 14 tablet Montoya Watkin C, PA-C   Cetirizine HCl 10 MG CAPS Take 1 capsule (10 mg total) by mouth daily for 10 days. 15 capsule Kyiesha Millward C, PA-C   phenazopyridine (PYRIDIUM) 200 MG tablet Take 1 tablet (200 mg total) by mouth 3 (three) times daily. 6 tablet Leshaun Biebel, [place] C, PA-C     PDMP not reviewed this encounter.   Janith Lima, [place] 12/25/18 [phone]

## 2018-12-26 LAB — NOVEL CORONAVIRUS, NAA (HOSP ORDER, SEND-OUT TO REF LAB; TAT 18-24 HRS): SARS-CoV-2, NAA: NOT DETECTED

## 2018-12-26 LAB — CULTURE, GROUP A STREP (THRC)

## 2018-12-29 ENCOUNTER — Telehealth (HOSPITAL_COMMUNITY): Payer: Self-pay | Admitting: Emergency Medicine

## 2018-12-29 NOTE — Telephone Encounter (Signed)
Culture shows group B, contacted pt, only symptom she has is burning with urination. Pt is on cipro, pt informed she needs to return to be reseen if she completes the antibiotics and is not better. Pt agreeable to plan, all questions answered.

## 2019-04-22 ENCOUNTER — Emergency Department (HOSPITAL_COMMUNITY)
Admission: EM | Admit: 2019-04-22 | Discharge: 2019-04-22 | Disposition: A | Discharge status: Home / Self Care | Payer: Medicare Other | Attending: Emergency Medicine | Admitting: Emergency Medicine

## 2019-04-22 ENCOUNTER — Emergency Department (HOSPITAL_COMMUNITY): Payer: Medicare Other

## 2019-04-22 ENCOUNTER — Other Ambulatory Visit: Payer: Self-pay

## 2019-04-22 ENCOUNTER — Encounter (HOSPITAL_COMMUNITY): Payer: Self-pay | Admitting: Emergency Medicine

## 2019-04-22 DIAGNOSIS — R1013 Epigastric pain: Secondary | ICD-10-CM | POA: Insufficient documentation

## 2019-04-22 DIAGNOSIS — Z20822 Contact with and (suspected) exposure to covid-19: Secondary | ICD-10-CM | POA: Diagnosis not present

## 2019-04-22 DIAGNOSIS — Z79899 Other long term (current) drug therapy: Secondary | ICD-10-CM | POA: Insufficient documentation

## 2019-04-22 DIAGNOSIS — E860 Dehydration: Secondary | ICD-10-CM | POA: Diagnosis not present

## 2019-04-22 DIAGNOSIS — B9689 Other specified bacterial agents as the cause of diseases classified elsewhere: Secondary | ICD-10-CM

## 2019-04-22 DIAGNOSIS — R112 Nausea with vomiting, unspecified: Secondary | ICD-10-CM | POA: Diagnosis present

## 2019-04-22 DIAGNOSIS — R102 Pelvic and perineal pain: Secondary | ICD-10-CM | POA: Diagnosis not present

## 2019-04-22 DIAGNOSIS — N760 Acute vaginitis: Secondary | ICD-10-CM | POA: Insufficient documentation

## 2019-04-22 DIAGNOSIS — I10 Essential (primary) hypertension: Secondary | ICD-10-CM | POA: Diagnosis not present

## 2019-04-22 DIAGNOSIS — R1084 Generalized abdominal pain: Secondary | ICD-10-CM

## 2019-04-22 LAB — URINALYSIS, ROUTINE W REFLEX MICROSCOPIC
Bilirubin Urine: NEGATIVE
Glucose, UA: 50 mg/dL — AB
Ketones, ur: 20 mg/dL — AB
Nitrite: NEGATIVE
Protein, ur: >=300 mg/dL — AB
Specific Gravity, Urine: 1.017 (ref 1.005–1.030)
pH: 7 (ref 5.0–8.0)

## 2019-04-22 LAB — COMPREHENSIVE METABOLIC PANEL
ALT: 19 U/L (ref 0–44)
AST: 37 U/L (ref 15–41)
Albumin: 5 g/dL (ref 3.5–5.0)
Alkaline Phosphatase: 60 U/L (ref 38–126)
Anion gap: 12 (ref 5–15)
BUN: 7 mg/dL (ref 6–20)
CO2: 26 mmol/L (ref 22–32)
Calcium: 9.5 mg/dL (ref 8.9–10.3)
Chloride: 97 mmol/L — ABNORMAL LOW (ref 98–111)
Creatinine, Ser: 0.79 mg/dL (ref 0.44–1.00)
GFR calc Af Amer: >60 mL/min (ref >60)
GFR calc non Af Amer: >60 mL/min (ref >60)
Glucose, Bld: 149 mg/dL — ABNORMAL HIGH (ref 70–99)
Potassium: 3.5 mmol/L (ref 3.5–5.1)
Sodium: 135 mmol/L (ref 135–145)
Total Bilirubin: 1 mg/dL (ref 0.3–1.2)
Total Protein: 8.6 g/dL — ABNORMAL HIGH (ref 6.5–8.1)

## 2019-04-22 LAB — RAPID URINE DRUG SCREEN, HOSP PERFORMED
Amphetamines: NOT DETECTED
Barbiturates: NOT DETECTED
Benzodiazepines: NOT DETECTED
Cocaine: NOT DETECTED
Opiates: NOT DETECTED
Tetrahydrocannabinol: POSITIVE — AB

## 2019-04-22 LAB — LIPASE, BLOOD: Lipase: 19 U/L (ref 11–51)

## 2019-04-22 LAB — WET PREP, GENITAL
Sperm: NONE SEEN
Trich, Wet Prep: NONE SEEN
Yeast Wet Prep HPF POC: NONE SEEN

## 2019-04-22 LAB — CBC
HCT: 47.9 % — ABNORMAL HIGH (ref 36.0–46.0)
Hemoglobin: 15.9 g/dL — ABNORMAL HIGH (ref 12.0–15.0)
MCH: 30.5 pg (ref 26.0–34.0)
MCHC: 33.2 g/dL (ref 30.0–36.0)
MCV: 91.8 fL (ref 80.0–100.0)
Platelets: 251 10*3/uL (ref 150–400)
RBC: 5.22 MIL/uL — ABNORMAL HIGH (ref 3.87–5.11)
RDW: 12 % (ref 11.5–15.5)
WBC: 12.3 10*3/uL — ABNORMAL HIGH (ref 4.0–10.5)
nRBC: 0 % (ref 0.0–0.2)

## 2019-04-22 LAB — POC SARS CORONAVIRUS 2 AG -  ED: SARS Coronavirus 2 Ag: NEGATIVE

## 2019-04-22 LAB — I-STAT BETA HCG BLOOD, ED (MC, WL, AP ONLY): I-stat hCG, quantitative: <5 m[IU]/mL (ref <5)

## 2019-04-22 MED ORDER — ONDANSETRON 4 MG PO TBDP
4.0000 mg | ORAL_TABLET | Freq: Once | ORAL | Status: DC | PRN
  Filled 2019-04-22: qty 1

## 2019-04-22 MED ORDER — IOHEXOL 300 MG/ML  SOLN
100.0000 mL | Freq: Once | INTRAMUSCULAR | Status: AC | PRN
  Administered 2019-04-22: 100 mL via INTRAVENOUS

## 2019-04-22 MED ORDER — MORPHINE SULFATE (PF) 4 MG/ML IV SOLN: 4.0000 mg | Freq: Once | INTRAVENOUS | Status: DC

## 2019-04-22 MED ORDER — SODIUM CHLORIDE (PF) 0.9 % IJ SOLN
INTRAMUSCULAR | Status: AC
  Filled 2019-04-22: qty 50

## 2019-04-22 MED ORDER — ONDANSETRON HCL 4 MG/2ML IJ SOLN
4.0000 mg | Freq: Once | INTRAMUSCULAR | Status: AC
  Administered 2019-04-22: 4 mg via INTRAVENOUS
  Filled 2019-04-22: qty 2

## 2019-04-22 MED ORDER — SODIUM CHLORIDE 0.9 % IV BOLUS
1000.0000 mL | Freq: Once | INTRAVENOUS | Status: AC
  Administered 2019-04-22: 14:00:00 1000 mL via INTRAVENOUS

## 2019-04-22 MED ORDER — PROMETHAZINE HCL 25 MG/ML IJ SOLN
25.0000 mg | Freq: Once | INTRAMUSCULAR | Status: AC
  Administered 2019-04-22: 25 mg via INTRAVENOUS
  Filled 2019-04-22: qty 1

## 2019-04-22 MED ORDER — PROMETHAZINE HCL 25 MG PO TABS: 25.0000 mg | ORAL_TABLET | Freq: Four times a day (QID) | ORAL | 0 refills | Status: DC | PRN

## 2019-04-22 MED ORDER — METRONIDAZOLE 500 MG PO TABS: 500.0000 mg | ORAL_TABLET | Freq: Two times a day (BID) | ORAL | 0 refills | Status: DC

## 2019-04-22 NOTE — Discharge Instructions (Addendum)
Please significant as prescribed.  Please follow-up with your gastroenterologist I have sent in information for you to follow-up with [facility] gastroenterology.  You may also follow-up with your primary care doctor.

## 2019-04-22 NOTE — ED Provider Notes (Signed)
[facility] DEPT Provider Note   CSN: [id number] Arrival date & time: 04/22/19  1159     History Chief Complaint  Patient presents with  . Abdominal Pain  . Emesis    Brittany Mullins is a 39 y.o. female.  The history is provided by the patient.  Abdominal Pain Pain location:  Epigastric Pain quality: sharp and stabbing   Pain radiates to:  Does not radiate Pain severity:  Severe Onset quality:  Unable to specify Duration:  12 hours Timing:  Constant Progression:  Unchanged Context: not alcohol use, not recent illness, not recent sexual activity and not recent travel   Relieved by:  Position changes Worsened by:  Movement, palpation and vomiting Ineffective treatments:  None tried Associated symptoms: anorexia, chills, fatigue, fever, nausea and vomiting   Associated symptoms: no chest pain, no cough, no dysuria, no hematemesis, no hematochezia, no melena, no shortness of breath, no vaginal bleeding and no vaginal discharge     39 year old female with a history of pancreatitis status post appendectomy and cholecystectomy presented today with periumbilical and epigastric abdominal pain that is severe, nonradiating, constant, and sharp/stabbing began at 1 AM this morning and has been constant since.  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  States she has not had any pancreatitis since.  Denies any alcohol use states that she smokes marijuana frequently most recently 4 days ago.  Patient brought in by EMS with temperature of 100.4 CBG 157.   Patient denies any hematuria, dysuria, frequency urgency.  Denies any changes with bowel movements.  Denies any back or neck pain.  States that she has had subjective fevers but not measured her temperature at home.     Past Medical History:  Diagnosis Date  . Addison's disease ([place])   . Brain tumor (benign) ([place])   .  Hypertension   . Marijuana use    Per pt: "medical marijuana patient"  . Pancreatitis   . PCOS (polycystic ovarian syndrome)   . Pituitary tumor   . Seizures [facility])     Patient Active Problem List   Diagnosis Date Noted  . History of Addison's disease   . Intractable cyclical vomiting with nausea   . Opioid dependence ([place]) 06/03/2017  . Generalized abdominal pain   . Hypomagnesemia   . Marijuana use     Past Surgical History:  Procedure Laterality Date  . ABDOMINAL HYSTERECTOMY    . APPENDECTOMY    . CESAREAN SECTION    . CHOLECYSTECTOMY    . ELBOW SURGERY    . kidney stent    . pancreatic stent       OB History   No obstetric history on file.     Family History  Problem Relation Age of Onset  . Cancer Mother   . Hypertension Mother   . Diabetes Maternal Grandmother   . Diabetes Maternal Grandfather     Social History   Tobacco Use  . Smoking status: Never Smoker  . Smokeless tobacco: Never Used  Substance Use Topics  . Alcohol use: No  . Drug use: Yes    Types: Marijuana    Home Medications Prior to Admission medications   Medication Sig Start Date End Date Taking? Authorizing Provider  acetaminophen (TYLENOL) 500 MG tablet Take 1,000 mg by mouth every 6 (six) hours as needed for headache.    [provider]  Cetirizine  HCl 10 MG CAPS Take 1 capsule (10 mg total) by mouth daily for 10 days. 12/24/18 01/03/19  Wieters, Hallie C, PA-C  cloNIDine (CATAPRES) 0.2 MG tablet TAKE 1 TABLET(0.2 MG) BY MOUTH THREE TIMES DAILY 05/19/18   Rutherford Guys, MD  metroNIDAZOLE (FLAGYL) 500 MG tablet Take 1 tablet (500 mg total) by mouth 2 (two) times daily. 04/22/19   Tedd Sias, PA  phenazopyridine (PYRIDIUM) 200 MG tablet Take 1 tablet (200 mg total) by mouth 3 (three) times daily. 12/24/18   Wieters, Hallie C, PA-C  promethazine (PHENERGAN) 25 MG tablet Take 1 tablet (25 mg total) by mouth every 6 (six) hours as needed for nausea or vomiting. 04/22/19    Tedd Sias, PA  SUMAtriptan (IMITREX) 6 MG/0.5ML SOLN injection Inject 6 mg into the skin every 2 (two) hours as needed for migraine or headache. May repeat in 2 hours if headache persists or recurs.    [provider]  LESSINA-28 0.1-20 MG-MCG tablet Take 1 tablet by mouth daily. 11/20/17 12/24/18  Tereasa Coop, PA-C  pantoprazole (PROTONIX) 40 MG tablet Take 1 tablet (40 mg total) by mouth 2 (two) times daily before a meal. 06/05/17 12/24/18  Isaac Bliss, Rayford Halsted, MD    Allergies    Bee venom, Toradol [ketorolac tromethamine], and Benadryl [diphenhydramine]  Review of Systems   Review of Systems  Constitutional: Positive for chills, fatigue and fever.  HENT: Negative for congestion.   Eyes: Negative for pain.  Respiratory: Negative for cough and shortness of breath.   Cardiovascular: Negative for chest pain and leg swelling.  Gastrointestinal: Positive for abdominal pain, anorexia, nausea and vomiting. Negative for hematemesis, hematochezia and melena.  Genitourinary: Negative for dysuria, vaginal bleeding and vaginal discharge.  Musculoskeletal: Negative for myalgias.  Skin: Negative for rash.  Neurological: Negative for dizziness and headaches.    Physical Exam Updated Vital Signs BP 139/89   Pulse 86   Temp 99.1 F (37.3 C) (Rectal)   Resp (!) 21   SpO2 99%   Physical Exam Vitals and nursing note reviewed.  Constitutional:      General: She is not in acute distress.    Comments: Appears to be in acute distress.  39 year old female appears older than stated age.  HENT:     Head: Normocephalic and atraumatic.     Nose: Nose normal.     Mouth/Throat:     Mouth: Mucous membranes are dry.     Pharynx: No posterior oropharyngeal erythema.  Eyes:     General: No scleral icterus. Cardiovascular:     Rate and Rhythm: Regular rhythm. Tachycardia present.     Pulses: Normal pulses.     Heart sounds: Normal heart sounds.     Comments: Mild tachycardia at  102. Pulmonary:     Effort: Pulmonary effort is normal. No respiratory distress.     Breath sounds: No wheezing.     Comments: Lungs are clear to auscultation bilaterally Abdominal:     General: There is no distension.     Palpations: Abdomen is soft. There is no mass.     Tenderness: There is abdominal tenderness (Severe tenderness to palpation of the epigastrium and umbilical region of abdomen.  No tenderness with percussion or with palpation with distraction.). There is no right CVA tenderness or left CVA tenderness.  Genitourinary:    Comments: Vulva without lesions or abnormality Vaginal canal without abnormal  Lesions.  Moderate amount of white thick discharge and vaginal canal.  Cervix appears normal, is closed, no purulent discharge Uncomfortable pelvic exam in general with no focal adnexal tenderness.  No CMT. Musculoskeletal:     Cervical back: Normal range of motion.     Right lower leg: No edema.     Left lower leg: No edema.  Skin:    General: Skin is warm and dry.     Capillary Refill: Capillary refill takes less than 2 seconds.  Neurological:     Mental Status: She is alert. Mental status is at baseline.  Psychiatric:        Mood and Affect: Mood normal.        Behavior: Behavior normal.     ED Results / Procedures / Treatments   Labs (all labs ordered are listed, but only abnormal results are displayed) Labs Reviewed  WET PREP, GENITAL - Abnormal; Notable for the following components:      Result Value   Clue Cells Wet Prep HPF POC PRESENT (*)    WBC, Wet Prep HPF POC RARE (*)    All other components within normal limits  COMPREHENSIVE METABOLIC PANEL - Abnormal; Notable for the following components:   Chloride 97 (*)    Glucose, Bld 149 (*)    Total Protein 8.6 (*)    All other components within normal limits  CBC - Abnormal; Notable for the following components:   WBC 12.3 (*)    RBC 5.22 (*)    Hemoglobin 15.9 (*)    HCT 47.9 (*)    All other  components within normal limits  URINALYSIS, ROUTINE W REFLEX MICROSCOPIC - Abnormal; Notable for the following components:   APPearance HAZY (*)    Glucose, UA 50 (*)    Hgb urine dipstick SMALL (*)    Ketones, ur 20 (*)    Protein, ur >=300 (*)    Leukocytes,Ua TRACE (*)    Bacteria, UA RARE (*)    All other components within normal limits  RAPID URINE DRUG SCREEN, HOSP PERFORMED - Abnormal; Notable for the following components:   Tetrahydrocannabinol POSITIVE (*)    All other components within normal limits  URINE CULTURE  LIPASE, BLOOD  I-STAT BETA HCG BLOOD, ED ([facility], [facility], [facility] ONLY)  POC SARS CORONAVIRUS 2 AG -  ED  GC/CHLAMYDIA PROBE AMP ([place]) NOT AT [facility]    EKG EKG Interpretation  Date/Time:  Wednesday April 22 2019 14:32:03 EST Ventricular Rate:  115 PR Interval:    QRS Duration: 96 QT Interval:  386 QTC Calculation: 462 R Axis:   97 Text Interpretation: Sinus tachycardia Ventricular bigeminy Biatrial enlargement Borderline right axis deviation Left ventricular hypertrophy Confirmed by Octaviano Glow [phone]) on 04/22/2019 2:41:05 PM   Radiology CT ABDOMEN PELVIS W CONTRAST  Result Date: 04/22/2019 CLINICAL DATA:  Abdominal pain, fever EXAM: CT ABDOMEN AND PELVIS WITH CONTRAST TECHNIQUE: Multidetector CT imaging of the abdomen and pelvis was performed using the standard protocol following bolus administration of intravenous contrast. CONTRAST:  131mL OMNIPAQUE IOHEXOL 300 MG/ML  SOLN COMPARISON:  None. FINDINGS: Lower chest: No acute abnormality. Hepatobiliary: No focal liver abnormality is seen. No gallstones, gallbladder wall thickening, or biliary dilatation. Pancreas: Unremarkable. No pancreatic ductal dilatation or surrounding inflammatory changes. Spleen: Normal in size without focal abnormality. Adrenals/Urinary Tract: Adrenal glands are unremarkable. Kidneys are normal, without renal calculi, focal lesion, or hydronephrosis. Bladder is unremarkable.  Stomach/Bowel: Stomach is within normal limits. Appendix not visualized. No evidence of bowel wall thickening, distention, or inflammatory changes. Vascular/Lymphatic: No significant  vascular findings are present. No enlarged abdominal or pelvic lymph nodes. Reproductive: Uterus and bilateral adnexa are unremarkable. Other: No abdominal wall hernia or abnormality. No abdominopelvic ascites. Musculoskeletal: No acute or significant osseous findings. Degenerative disc disease with disc height loss at L5-S1. IMPRESSION: 1. No acute abdominal or pelvic pathology. Electronically Signed   By: Kathreen Devoid   On: 04/22/2019 15:41    Procedures Procedures (including critical care time)  Medications Ordered in ED Medications  sodium chloride (PF) 0.9 % injection (has no administration in time range)  promethazine (PHENERGAN) injection 25 mg (25 mg Intravenous Given 04/22/19 1424)  sodium chloride 0.9 % bolus 1,000 mL (0 mLs Intravenous Stopped 04/22/19 1603)  iohexol (OMNIPAQUE) 300 MG/ML solution 100 mL (100 mLs Intravenous Contrast Given 04/22/19 1523)    ED Course  I have reviewed the triage vital signs and the nursing notes.  Pertinent labs & imaging results that were available during my care of the patient were reviewed by me and considered in my medical decision making (see chart for details).  Patient presents today with mildly elevated temperature, mild tachycardia, abdominal pain, emesis but she states is consistent with pancreatitis she has had in the past.  Is unable to tolerate p.o. at this time.  CMP unremarkable.  CBC with mild leukocytosis.  Patient has mildly elevated temperature.  Urine drug screen positive for THC.  Wet prep with clue cells and WBC present.  Rapid antigen test negative for Covid.  Patient declined swab.  UA remarkable for obvious dehydration with protein, ketones, hazy appearance.  Rare bacteria present also appears to be contaminated with squamous epithelial cells present.   Will culture.  No indication to treat at this time.  Patient is asymptomatic.    Clinical Course as of Apr 21 1716  Wed Apr 22, 2019  1601 CT abdomen pelvis without any acute abnormality.  CT ABDOMEN PELVIS W CONTRAST [WF]  1601 ED EKG [WF]    Clinical Course User Index [WF] Tedd Sias, PA   Tachycardia has resolved with 1 L normal saline.  Patient states she feels much improved no longer has abdominal tenderness and states her nausea is better at this time.  We will discharge patient when asked to follow-up with gastroenterology.  She also has primary care doctor to follow-up with.  She is well-appearing at this time and able to tolerate p.o.  Rectal temperature was negative for elevated temperature.  Suspect THC cyclic vomiting.  Recommended that she stop smoking so heavily or cut out THC completely.  MDM Rules/Calculators/A&P                      Brittany Mullins was evaluated in Emergency Department on 04/22/2019 for the symptoms described in the history of present illness. She was evaluated in the context of the global COVID-19 pandemic, which necessitated consideration that the patient might be at risk for infection with the SARS-CoV-2 virus that causes COVID-19. Institutional protocols and algorithms that pertain to the evaluation of patients at risk for COVID-19 are in a state of rapid change based on information released by regulatory bodies including the CDC and federal and state organizations. These policies and algorithms were followed during the patient's care in the ED.  The medical records were personally reviewed by myself. I personally reviewed all lab results and interpreted all imaging studies and either concurred with their official read or contacted radiology for clarification.   This patient appears reasonably screened and I  doubt any other medical condition requiring further workup, evaluation, or treatment in the ED at this time prior to discharge.   Patient's  vitals are WNL apart from vital sign abnormalities discussed above, patient is in NAD, and able to ambulate in the ED at their baseline and able to tolerate PO.  Pain has been managed or a plan has been made for home management and has no complaints prior to discharge. Patient is comfortable with above plan and for discharge at this time. All questions were answered prior to disposition. Results from the ER workup discussed with the patient face to face and all questions answered to the best of my ability. The patient is safe for discharge with strict return precautions. Patient appears safe for discharge with appropriate follow-up. Conveyed my impression with the patient and they voiced understanding and are agreeable to plan.   I discussed this case with my attending physician who cosigned this note including patient's presenting symptoms, physical exam, and planned diagnostics and interventions. Attending physician stated agreement with plan or made changes to plan which were implemented.   An After Visit Summary was printed and given to the patient.  Portions of this note were generated with [employer]. Dictation errors may occur despite best attempts at proofreading.   Final Clinical Impression(s) / ED Diagnoses Final diagnoses:  Nausea and vomiting, intractability of vomiting not specified, unspecified vomiting type  Dehydration  Generalized abdominal pain  Bacterial vaginosis    Rx / DC Orders ED Discharge Orders         Ordered    promethazine (PHENERGAN) 25 MG tablet  Every 6 hours PRN     04/22/19 1716    metroNIDAZOLE (FLAGYL) 500 MG tablet  2 times daily     04/22/19 1718           Pati Gallo [place], [place] 04/22/19 1722    Wyvonnia Dusky, MD 04/22/19 [phone]

## 2019-04-22 NOTE — ED Triage Notes (Signed)
Per [facility] pt from home for abd pains with n/v that started around 1am this morning. Hx pancreatitis, denies drinking recently with EMS.  Vitals: 100.4, 99%, 157CBg. 144/90.

## 2019-04-22 NOTE — ED Notes (Signed)
An After Visit Summary was printed and given to the patient. Discharge instructions given and no further questions at this time. Pt able to ambulate without assistance to restroom.

## 2019-04-23 LAB — GC/CHLAMYDIA PROBE AMP (~~LOC~~) NOT AT ARMC
Chlamydia: NEGATIVE
Neisseria Gonorrhea: NEGATIVE

## 2019-04-24 LAB — URINE CULTURE: Culture: <10000 — AB

## 2019-07-05 IMAGING — US US ABDOMEN LIMITED
1 series · 14 of 25 positions shown · non-contrast
Comparison: CT 06/02/2017

CLINICAL DATA: Elevated LFTs, abdominal pain x2 weeks

EXAM:
ULTRASOUND ABDOMEN LIMITED RIGHT UPPER QUADRANT

[Series 1: us abdomen limited · 0.20mm/px · 14 of 29 slices shown]
[im 1/29]
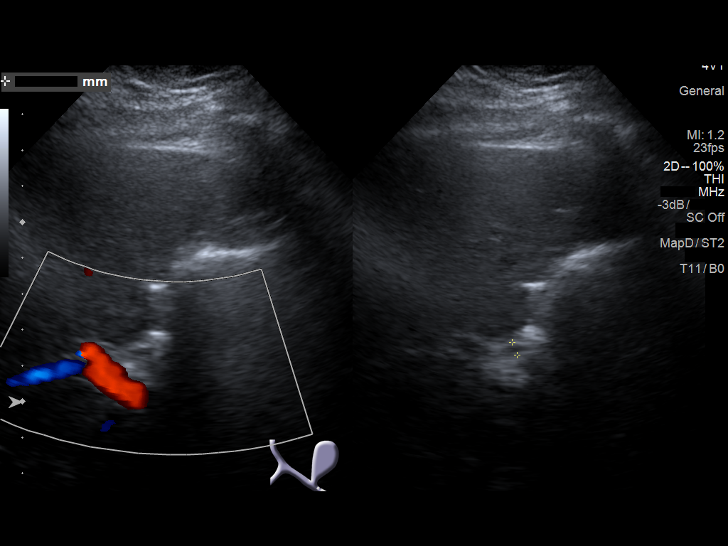
[im 3/29]
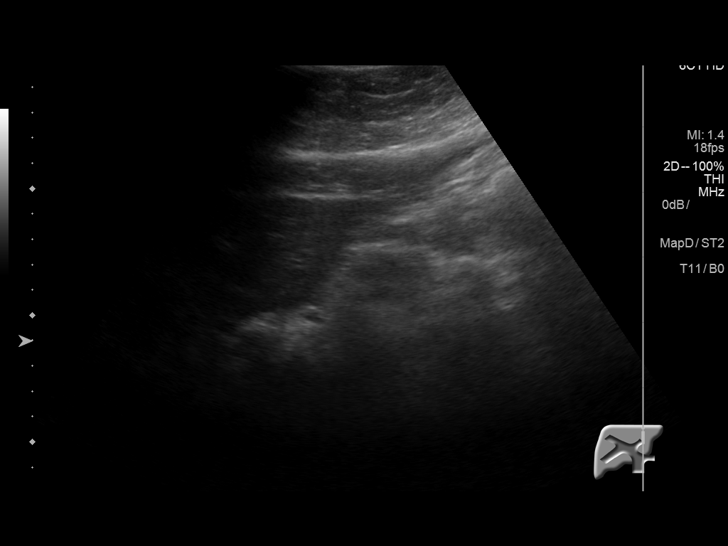
[im 5/29]
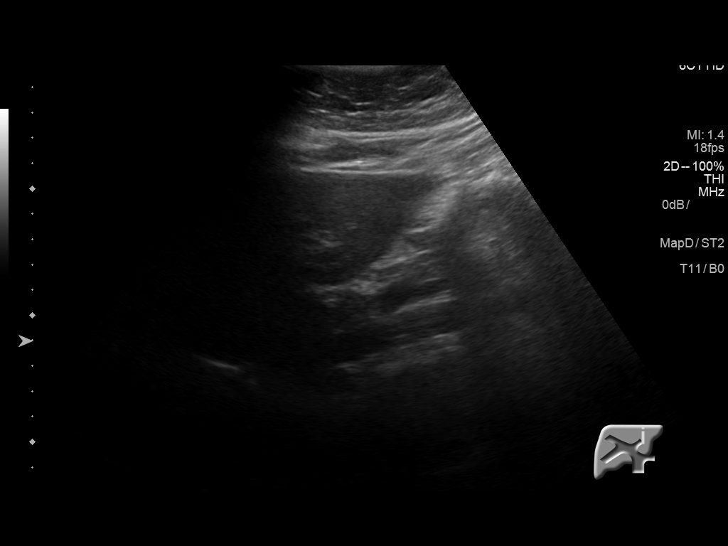
[im 8/29]
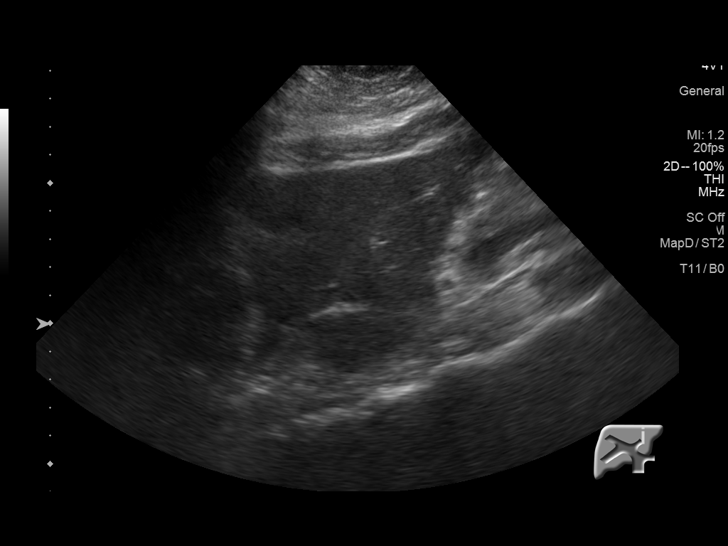
[im 10/29]
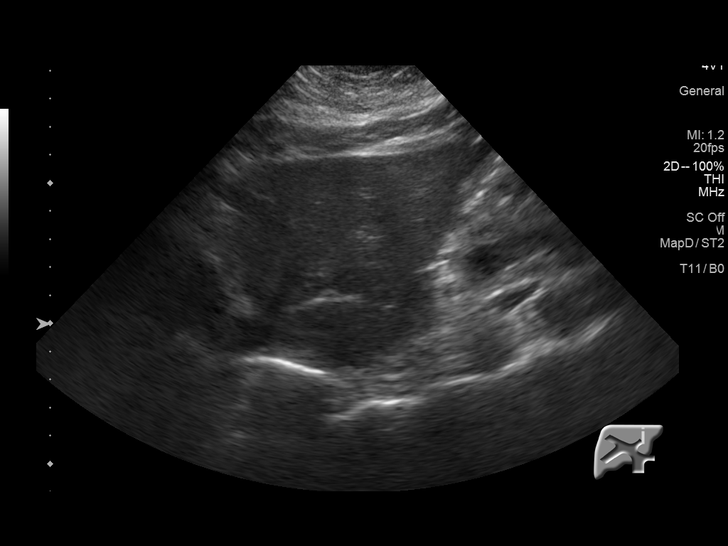
[im 11/29]
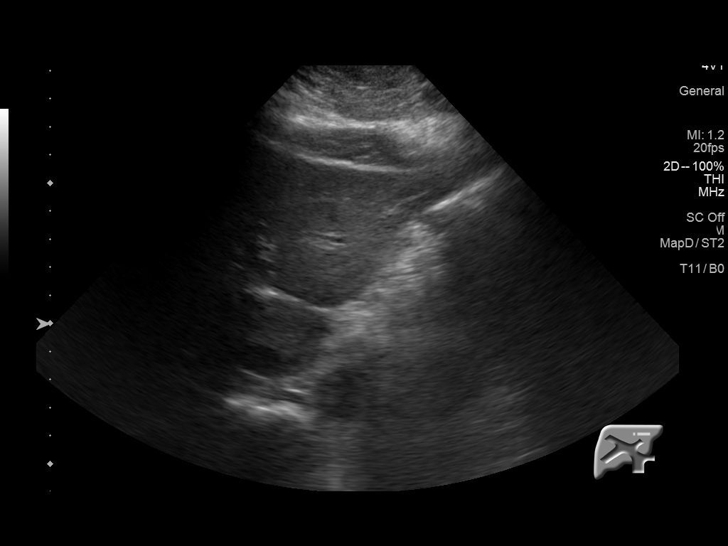
[im 13/29]
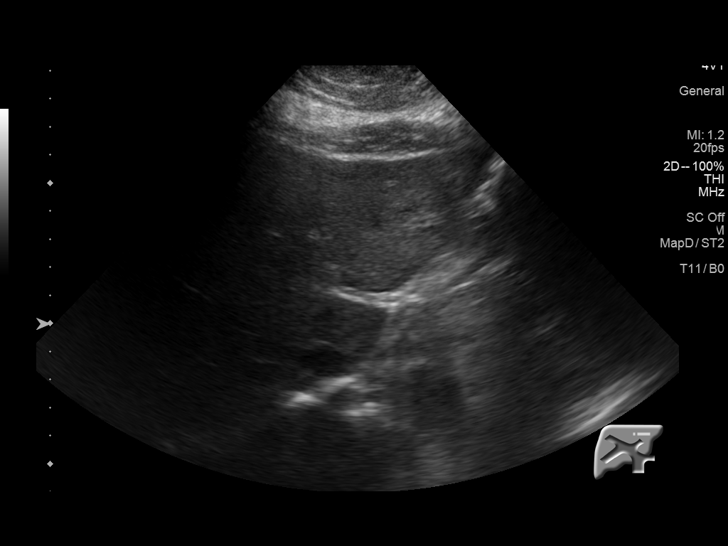
[im 16/29]
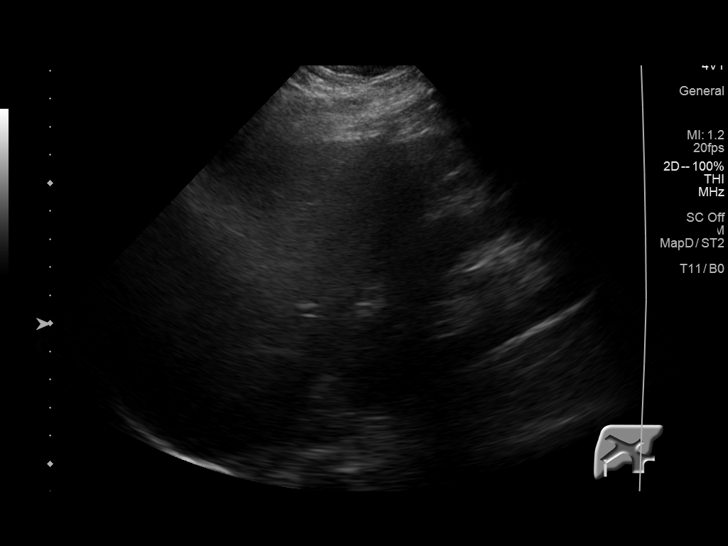
[im 18/29]
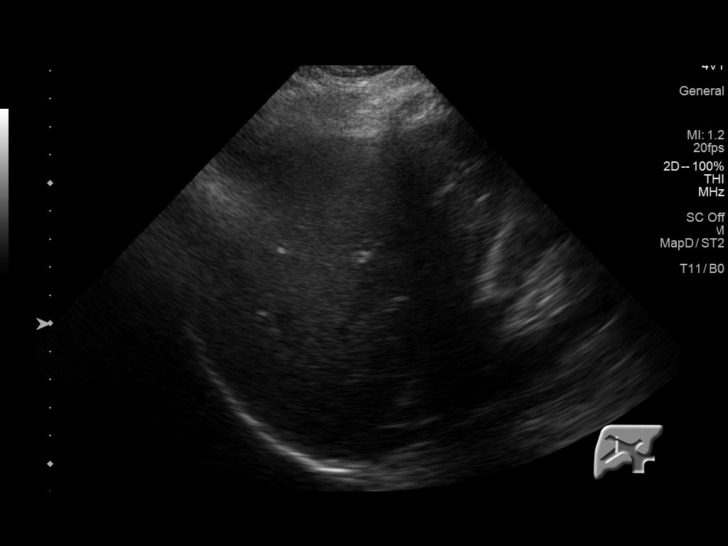
[im 19/29]
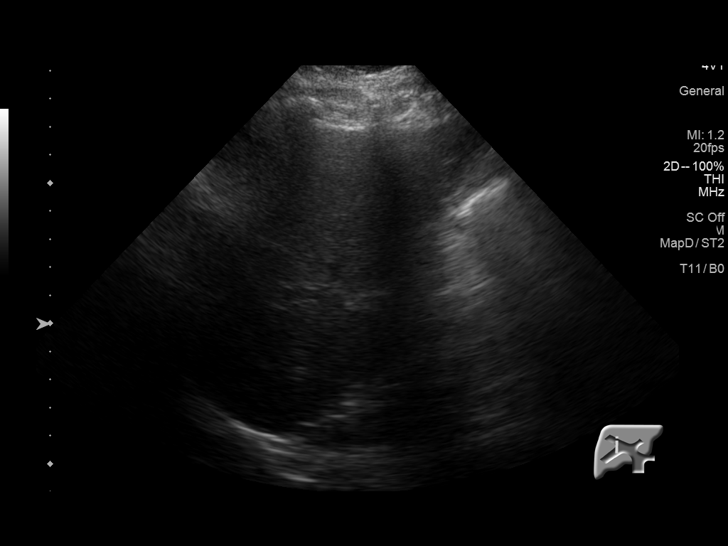
[im 22/29]
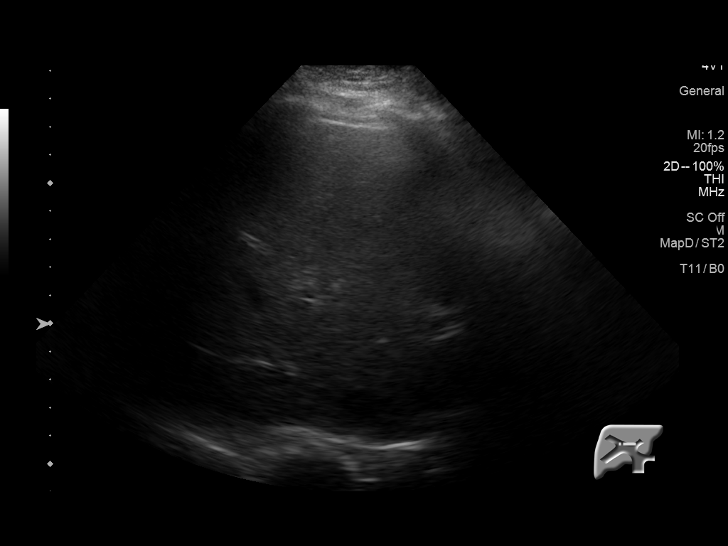
[im 24/29]
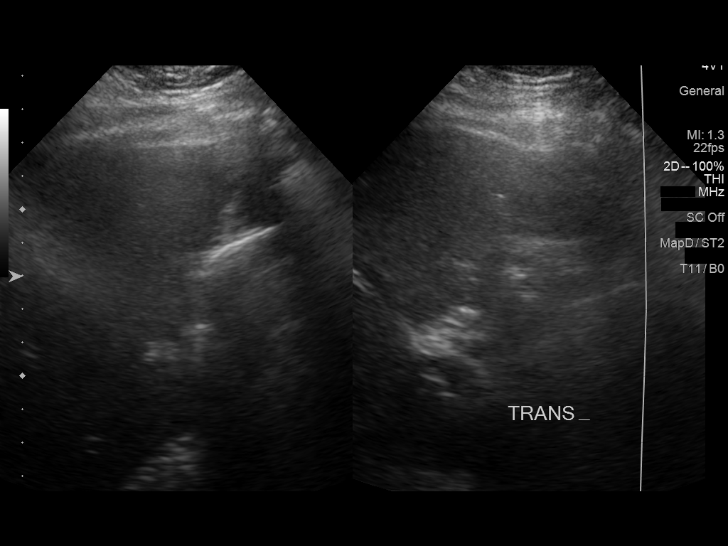
[im 26/29]
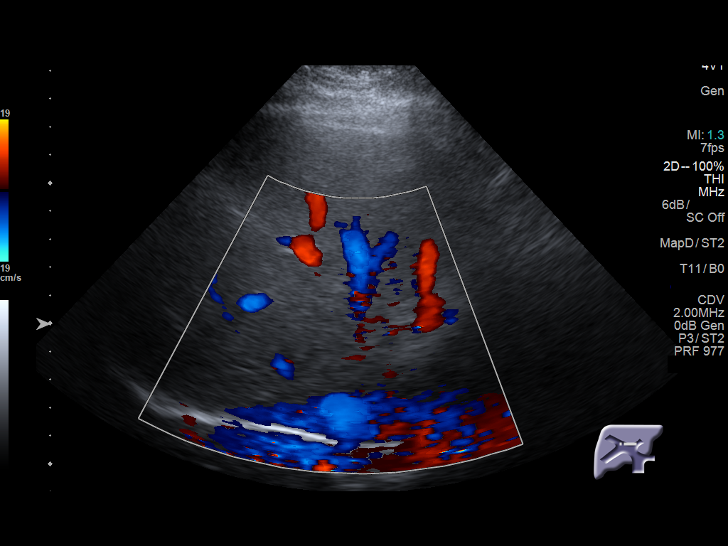
[im 29/29]
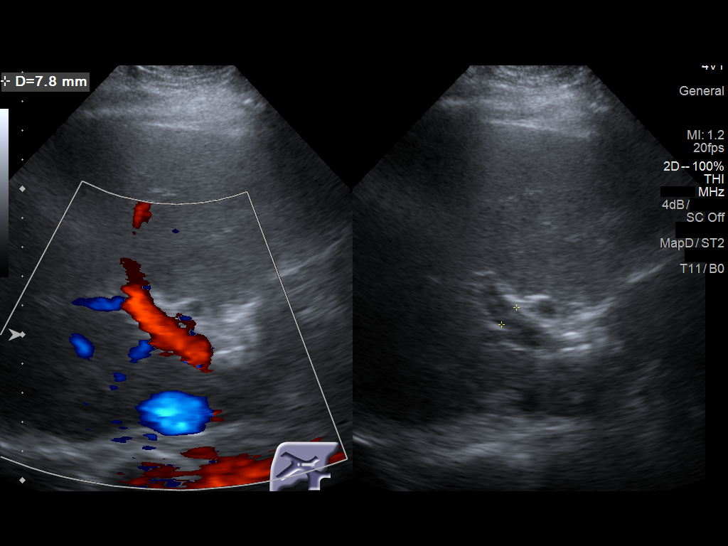

[14 of 25 positions shown; findings below may reference images not displayed]

FINDINGS: Gallbladder:

Surgically absent

Common bile duct:

Diameter: 4 mm, unremarkable

Liver:

No focal lesion identified. Within normal limits in parenchymal
echogenicity. Portal vein is patent on color Doppler imaging with
normal direction of blood flow towards the liver.
IMPRESSION: No acute findings post cholecystectomy.

## 2019-10-19 ENCOUNTER — Other Ambulatory Visit: Payer: Self-pay

## 2019-10-19 ENCOUNTER — Ambulatory Visit (HOSPITAL_COMMUNITY)
Admission: EM | Admit: 2019-10-19 | Discharge: 2019-10-19 | Disposition: A | Discharge status: Home / Self Care | Payer: Medicare Other | Attending: Family Medicine | Admitting: Family Medicine

## 2019-10-19 ENCOUNTER — Ambulatory Visit (INDEPENDENT_AMBULATORY_CARE_PROVIDER_SITE_OTHER): Payer: Medicare Other

## 2019-10-19 ENCOUNTER — Encounter (HOSPITAL_COMMUNITY): Payer: Self-pay

## 2019-10-19 DIAGNOSIS — Z793 Long term (current) use of hormonal contraceptives: Secondary | ICD-10-CM | POA: Insufficient documentation

## 2019-10-19 DIAGNOSIS — R0602 Shortness of breath: Secondary | ICD-10-CM | POA: Diagnosis not present

## 2019-10-19 DIAGNOSIS — Z8249 Family history of ischemic heart disease and other diseases of the circulatory system: Secondary | ICD-10-CM | POA: Diagnosis not present

## 2019-10-19 DIAGNOSIS — I10 Essential (primary) hypertension: Secondary | ICD-10-CM | POA: Insufficient documentation

## 2019-10-19 DIAGNOSIS — Z888 Allergy status to other drugs, medicaments and biological substances status: Secondary | ICD-10-CM | POA: Insufficient documentation

## 2019-10-19 DIAGNOSIS — Z20822 Contact with and (suspected) exposure to covid-19: Secondary | ICD-10-CM | POA: Diagnosis not present

## 2019-10-19 DIAGNOSIS — R05 Cough: Secondary | ICD-10-CM

## 2019-10-19 DIAGNOSIS — Z79899 Other long term (current) drug therapy: Secondary | ICD-10-CM | POA: Insufficient documentation

## 2019-10-19 DIAGNOSIS — E271 Primary adrenocortical insufficiency: Secondary | ICD-10-CM | POA: Diagnosis not present

## 2019-10-19 DIAGNOSIS — J209 Acute bronchitis, unspecified: Secondary | ICD-10-CM | POA: Diagnosis not present

## 2019-10-19 DIAGNOSIS — R062 Wheezing: Secondary | ICD-10-CM

## 2019-10-19 LAB — SARS CORONAVIRUS 2 (TAT 6-24 HRS): SARS Coronavirus 2: NEGATIVE

## 2019-10-19 MED ORDER — ALBUTEROL SULFATE HFA 108 (90 BASE) MCG/ACT IN AERS
2.0000 | INHALATION_SPRAY | Freq: Once | RESPIRATORY_TRACT | Status: AC
  Administered 2019-10-19: 2 via RESPIRATORY_TRACT

## 2019-10-19 MED ORDER — ALBUTEROL SULFATE HFA 108 (90 BASE) MCG/ACT IN AERS
INHALATION_SPRAY | RESPIRATORY_TRACT | Status: AC
  Filled 2019-10-19: qty 6.7

## 2019-10-19 MED ORDER — BENZONATATE 100 MG PO CAPS
100.0000 mg | ORAL_CAPSULE | Freq: Three times a day (TID) | ORAL | 0 refills | Status: DC
Start: 2019-10-19 — End: 2020-05-18

## 2019-10-19 MED ORDER — ONDANSETRON 4 MG PO TBDP
4.0000 mg | ORAL_TABLET | Freq: Three times a day (TID) | ORAL | 0 refills | Status: DC | PRN
Start: 2019-10-19 — End: 2020-05-18

## 2019-10-19 MED ORDER — AZITHROMYCIN 250 MG PO TABS: ORAL_TABLET | ORAL | 0 refills | Status: AC

## 2019-10-19 MED ORDER — PREDNISONE 20 MG PO TABS: 40.0000 mg | ORAL_TABLET | Freq: Every day | ORAL | 0 refills | Status: AC

## 2019-10-19 NOTE — Discharge Instructions (Addendum)
Your xray is overall reassuring, although there are indications of bronchitis which is consistent with what your lungs sound like today.  Complete course of antibiotics.  Complete course of steroids.  Use of inhaler as needed for wheezing or shortness of breath.   Push fluids to ensure adequate hydration and keep secretions thin.  Rest.  Zofran every 8 hours as needed for nausea or vomiting.   Tessalon as needed for cough.  If symptoms worsen or do not improve in the next week to return to be seen or to follow up with your PCP.

## 2019-10-19 NOTE — ED Provider Notes (Signed)
[place]    CSN: [id number] Arrival date & time: 10/19/19  0626      History   Chief Complaint Chief Complaint  Patient presents with  . Cough    HPI Brittany Mullins is a 39 y.o. female.   Brittany Mullins presents with complaints of cough, congestion, shortness of breath  Which started approximately 3 days ago. Cough is productive of phlegm. Today felt worse, felt more shortness of breath related to cough. Sometimes cough becomes so severe it has made her vomit. Otherwise no nausea or diarrhea. Some sore throat. Has taken motrin which has helped with fever. tmax of 100.3. taking a hot shower has helped with her cough. No known ill contacts. No history of covid-19 and has not received vaccination. No history of asthma, denies smoking history.     ROS per HPI, negative if not otherwise mentioned.      Past Medical History:  Diagnosis Date  . Addison's disease ([place])   . Brain tumor (benign) ([place])   . Hypertension   . Marijuana use    Per pt: "medical marijuana patient"  . Pancreatitis   . PCOS (polycystic ovarian syndrome)   . Pituitary tumor   . Seizures [facility])     Patient Active Problem List   Diagnosis Date Noted  . History of Addison's disease   . Intractable cyclical vomiting with nausea   . Opioid dependence ([place]) 06/03/2017  . Generalized abdominal pain   . Hypomagnesemia   . Marijuana use     Past Surgical History:  Procedure Laterality Date  . ABDOMINAL HYSTERECTOMY    . APPENDECTOMY    . CESAREAN SECTION    . CHOLECYSTECTOMY    . ELBOW SURGERY    . kidney stent    . pancreatic stent      OB History   No obstetric history on file.      Home Medications    Prior to Admission medications   Medication Sig Start Date End Date Taking? Authorizing Provider  acetaminophen (TYLENOL) 500 MG tablet Take 1,000 mg by mouth every 6 (six) hours as needed for headache.    [provider]  azithromycin (ZITHROMAX) 250 MG tablet  Take 2 tablets (500 mg total) by mouth daily for 1 day, THEN 1 tablet (250 mg total) daily for 4 days. 10/19/19 10/24/19  Zigmund Gottron, NP  benzonatate (TESSALON) 100 MG capsule Take 1 capsule (100 mg total) by mouth every 8 (eight) hours. 10/19/19   Zigmund Gottron, NP  Cetirizine HCl 10 MG CAPS Take 1 capsule (10 mg total) by mouth daily for 10 days. 12/24/18 01/03/19  Wieters, Hallie C, PA-C  cloNIDine (CATAPRES) 0.2 MG tablet TAKE 1 TABLET(0.2 MG) BY MOUTH THREE TIMES DAILY 05/19/18   Rutherford Guys, MD  metroNIDAZOLE (FLAGYL) 500 MG tablet Take 1 tablet (500 mg total) by mouth 2 (two) times daily. 04/22/19   Tedd Sias, PA  ondansetron (ZOFRAN-ODT) 4 MG disintegrating tablet Take 1 tablet (4 mg total) by mouth every 8 (eight) hours as needed for nausea or vomiting. 10/19/19   Zigmund Gottron, NP  phenazopyridine (PYRIDIUM) 200 MG tablet Take 1 tablet (200 mg total) by mouth 3 (three) times daily. 12/24/18   Wieters, Hallie C, PA-C  predniSONE (DELTASONE) 20 MG tablet Take 2 tablets (40 mg total) by mouth daily with breakfast for 5 days. 10/19/19 10/24/19  Zigmund Gottron, NP  promethazine (PHENERGAN) 25 MG tablet Take 1 tablet (25 mg  total) by mouth every 6 (six) hours as needed for nausea or vomiting. 04/22/19   Tedd Sias, PA  SUMAtriptan (IMITREX) 6 MG/0.5ML SOLN injection Inject 6 mg into the skin every 2 (two) hours as needed for migraine or headache. May repeat in 2 hours if headache persists or recurs.    [provider]  LESSINA-28 0.1-20 MG-MCG tablet Take 1 tablet by mouth daily. 11/20/17 12/24/18  Tereasa Coop, PA-C  pantoprazole (PROTONIX) 40 MG tablet Take 1 tablet (40 mg total) by mouth 2 (two) times daily before a meal. 06/05/17 12/24/18  Isaac Bliss, Rayford Halsted, MD    Family History Family History  Problem Relation Age of Onset  . Cancer Mother   . Hypertension Mother   . Diabetes Maternal Grandmother   . Diabetes Maternal Grandfather     Social  History Social History   Tobacco Use  . Smoking status: Never Smoker  . Smokeless tobacco: Never Used  Vaping Use  . Vaping Use: Never used  Substance Use Topics  . Alcohol use: No  . Drug use: Yes    Types: Marijuana     Allergies   Bee venom, Toradol [ketorolac tromethamine], and Benadryl [diphenhydramine]   Review of Systems Review of Systems   Physical Exam Triage Vital Signs ED Triage Vitals  Enc Vitals Group     BP 10/19/19 1017 (!) 163/90     Pulse Rate 10/19/19 1017 69     Resp 10/19/19 1017 16     Temp 10/19/19 1017 98.5 F (36.9 C)     Temp Source 10/19/19 1017 Oral     SpO2 10/19/19 1017 99 %     Weight 10/19/19 1018 145 lb (65.8 kg)     Height 10/19/19 1018 5\' 3"  (1.6 m)     Head Circumference --      Peak Flow --      Pain Score 10/19/19 1018 5     Pain Loc --      Pain Edu? --      Excl. in [place]? --    No data found.  Updated Vital Signs BP (!) 163/90   Pulse 69   Temp 98.5 F (36.9 C) (Oral)   Resp 16   Ht 5\' 3"  (1.6 m)   Wt 145 lb (65.8 kg)   SpO2 99%   BMI 25.69 kg/m   Visual Acuity Right Eye Distance:   Left Eye Distance:   Bilateral Distance:    Right Eye Near:   Left Eye Near:    Bilateral Near:     Physical Exam Constitutional:      General: She is not in acute distress.    Appearance: She is well-developed.  HENT:     Head: Normocephalic and atraumatic.     Right Ear: Tympanic membrane, ear canal and external ear normal.     Left Ear: Tympanic membrane, ear canal and external ear normal.     Nose: Nose normal.     Mouth/Throat:     Pharynx: Uvula midline.     Tonsils: No tonsillar exudate.  Eyes:     Conjunctiva/sclera: Conjunctivae normal.     Pupils: Pupils are equal, round, and reactive to light.  Cardiovascular:     Rate and Rhythm: Normal rate and regular rhythm.     Heart sounds: Normal heart sounds.  Pulmonary:     Effort: Pulmonary effort is normal.     Breath sounds: Wheezing present.     Comments:  Course lung sounds as well as wheezing; congested cough noted  Skin:    General: Skin is warm and dry.  Neurological:     Mental Status: She is alert and oriented to person, place, and time.      UC Treatments / Results  Labs (all labs ordered are listed, but only abnormal results are displayed) Labs Reviewed  SARS CORONAVIRUS 2 (TAT 6-24 HRS)    EKG   Radiology DG Chest 2 View  Result Date: 10/19/2019 CLINICAL DATA:  Cough, shortness of breath and wheezing for 3 days. EXAM: CHEST - 2 VIEW COMPARISON:  12/21/2017 FINDINGS: The cardiac silhouette, mediastinal and hilar contours are within normal limits and stable. Mild bronchitic lung changes could suggest bronchitis. No infiltrates or effusions. The bony thorax is intact. IMPRESSION: Probable bronchitis.  No infiltrates or effusions. Electronically Signed   By: Marijo Sanes M.D.   On: 10/19/2019 11:27    Procedures Procedures (including critical care time)  Medications Ordered in UC Medications  albuterol (VENTOLIN HFA) 108 (90 Base) MCG/ACT inhaler 2 puff (2 puffs Inhalation Given 10/19/19 1114)    Initial Impression / Assessment and Plan / UC Course  I have reviewed the triage vital signs and the nursing notes.  Pertinent labs & imaging results that were available during my care of the patient were reviewed by me and considered in my medical decision making (see chart for details).     No increased work of breathing. Symptom improvement with inhaler in clinic today. Bronchitis on xray, consistent with exam. covid pending. Prednisone provided. Return precautions provided. Patient verbalized understanding and agreeable to plan.   Final Clinical Impressions(s) / UC Diagnoses   Final diagnoses:  Acute bronchitis, unspecified organism     Discharge Instructions     Your xray is overall reassuring, although there are indications of bronchitis which is consistent with what your lungs sound like today.  Complete course  of antibiotics.  Complete course of steroids.  Use of inhaler as needed for wheezing or shortness of breath.   Push fluids to ensure adequate hydration and keep secretions thin.  Rest.  Zofran every 8 hours as needed for nausea or vomiting.   Tessalon as needed for cough.  If symptoms worsen or do not improve in the next week to return to be seen or to follow up with your PCP.     ED Prescriptions    Medication Sig Dispense Auth. Provider   predniSONE (DELTASONE) 20 MG tablet Take 2 tablets (40 mg total) by mouth daily with breakfast for 5 days. 10 tablet Augusto Gamble B, NP   azithromycin (ZITHROMAX) 250 MG tablet Take 2 tablets (500 mg total) by mouth daily for 1 day, THEN 1 tablet (250 mg total) daily for 4 days. 6 tablet Augusto Gamble B, NP   ondansetron (ZOFRAN-ODT) 4 MG disintegrating tablet Take 1 tablet (4 mg total) by mouth every 8 (eight) hours as needed for nausea or vomiting. 12 tablet Augusto Gamble B, NP   benzonatate (TESSALON) 100 MG capsule Take 1 capsule (100 mg total) by mouth every 8 (eight) hours. 21 capsule Zigmund Gottron, NP     PDMP not reviewed this encounter.   Zigmund Gottron, NP 10/19/19 1227

## 2019-10-19 NOTE — ED Triage Notes (Signed)
Pt c/o productive cough w/green mucous, SOB, HA, chills, chest congestion, vomiting from coughing so muchx3 days. Pt has non labored breathing.

## 2019-10-25 ENCOUNTER — Encounter (HOSPITAL_COMMUNITY): Payer: Self-pay

## 2019-10-25 ENCOUNTER — Emergency Department (HOSPITAL_COMMUNITY): Payer: Medicare Other

## 2019-10-25 ENCOUNTER — Inpatient Hospital Stay (HOSPITAL_COMMUNITY)
Admission: EM | Admit: 2019-10-25 | Discharge: 2019-10-27 | DRG: 392 | Disposition: A | Discharge status: Home / Self Care | Payer: Medicare Other | Attending: Internal Medicine | Admitting: Internal Medicine

## 2019-10-25 DIAGNOSIS — G40909 Epilepsy, unspecified, not intractable, without status epilepticus: Secondary | ICD-10-CM | POA: Diagnosis present

## 2019-10-25 DIAGNOSIS — Z8249 Family history of ischemic heart disease and other diseases of the circulatory system: Secondary | ICD-10-CM

## 2019-10-25 DIAGNOSIS — Z833 Family history of diabetes mellitus: Secondary | ICD-10-CM

## 2019-10-25 DIAGNOSIS — I10 Essential (primary) hypertension: Secondary | ICD-10-CM | POA: Diagnosis present

## 2019-10-25 DIAGNOSIS — E271 Primary adrenocortical insufficiency: Secondary | ICD-10-CM | POA: Diagnosis present

## 2019-10-25 DIAGNOSIS — Z8639 Personal history of other endocrine, nutritional and metabolic disease: Secondary | ICD-10-CM

## 2019-10-25 DIAGNOSIS — R112 Nausea with vomiting, unspecified: Secondary | ICD-10-CM

## 2019-10-25 DIAGNOSIS — Z20822 Contact with and (suspected) exposure to covid-19: Secondary | ICD-10-CM | POA: Diagnosis present

## 2019-10-25 DIAGNOSIS — Z91030 Bee allergy status: Secondary | ICD-10-CM

## 2019-10-25 DIAGNOSIS — R109 Unspecified abdominal pain: Secondary | ICD-10-CM

## 2019-10-25 DIAGNOSIS — G894 Chronic pain syndrome: Secondary | ICD-10-CM | POA: Diagnosis present

## 2019-10-25 DIAGNOSIS — F1110 Opioid abuse, uncomplicated: Secondary | ICD-10-CM | POA: Diagnosis present

## 2019-10-25 DIAGNOSIS — Z79891 Long term (current) use of opiate analgesic: Secondary | ICD-10-CM

## 2019-10-25 DIAGNOSIS — Z9689 Presence of other specified functional implants: Secondary | ICD-10-CM | POA: Diagnosis present

## 2019-10-25 DIAGNOSIS — E876 Hypokalemia: Secondary | ICD-10-CM | POA: Diagnosis present

## 2019-10-25 DIAGNOSIS — Z888 Allergy status to other drugs, medicaments and biological substances status: Secondary | ICD-10-CM

## 2019-10-25 DIAGNOSIS — Z79899 Other long term (current) drug therapy: Secondary | ICD-10-CM

## 2019-10-25 DIAGNOSIS — Z7952 Long term (current) use of systemic steroids: Secondary | ICD-10-CM

## 2019-10-25 DIAGNOSIS — F1290 Cannabis use, unspecified, uncomplicated: Secondary | ICD-10-CM | POA: Diagnosis present

## 2019-10-25 DIAGNOSIS — Z86011 Personal history of benign neoplasm of the brain: Secondary | ICD-10-CM

## 2019-10-25 LAB — COMPREHENSIVE METABOLIC PANEL
ALT: 22 U/L (ref 0–44)
AST: 29 U/L (ref 15–41)
Albumin: 4.3 g/dL (ref 3.5–5.0)
Alkaline Phosphatase: 62 U/L (ref 38–126)
Anion gap: 15 (ref 5–15)
BUN: 11 mg/dL (ref 6–20)
CO2: 24 mmol/L (ref 22–32)
Calcium: 9.8 mg/dL (ref 8.9–10.3)
Chloride: 97 mmol/L — ABNORMAL LOW (ref 98–111)
Creatinine, Ser: 0.73 mg/dL (ref 0.44–1.00)
GFR calc Af Amer: >60 mL/min (ref >60)
GFR calc non Af Amer: >60 mL/min (ref >60)
Glucose, Bld: 104 mg/dL — ABNORMAL HIGH (ref 70–99)
Potassium: 3.4 mmol/L — ABNORMAL LOW (ref 3.5–5.1)
Sodium: 136 mmol/L (ref 135–145)
Total Bilirubin: 1.2 mg/dL (ref 0.3–1.2)
Total Protein: 7.7 g/dL (ref 6.5–8.1)

## 2019-10-25 LAB — URINALYSIS, ROUTINE W REFLEX MICROSCOPIC
Bilirubin Urine: NEGATIVE
Glucose, UA: NEGATIVE mg/dL
Hgb urine dipstick: NEGATIVE
Ketones, ur: 80 mg/dL — AB
Leukocytes,Ua: NEGATIVE
Nitrite: NEGATIVE
Protein, ur: 30 mg/dL — AB
Specific Gravity, Urine: 1.024 (ref 1.005–1.030)
pH: 8 (ref 5.0–8.0)

## 2019-10-25 LAB — RAPID URINE DRUG SCREEN, HOSP PERFORMED
Amphetamines: NOT DETECTED
Barbiturates: NOT DETECTED
Benzodiazepines: NOT DETECTED
Cocaine: NOT DETECTED
Opiates: POSITIVE — AB
Tetrahydrocannabinol: POSITIVE — AB

## 2019-10-25 LAB — CBC
HCT: 44.4 % (ref 36.0–46.0)
Hemoglobin: 15.2 g/dL — ABNORMAL HIGH (ref 12.0–15.0)
MCH: 30.7 pg (ref 26.0–34.0)
MCHC: 34.2 g/dL (ref 30.0–36.0)
MCV: 89.7 fL (ref 80.0–100.0)
Platelets: 234 10*3/uL (ref 150–400)
RBC: 4.95 MIL/uL (ref 3.87–5.11)
RDW: 11.9 % (ref 11.5–15.5)
WBC: 9.4 10*3/uL (ref 4.0–10.5)
nRBC: 0 % (ref 0.0–0.2)

## 2019-10-25 LAB — I-STAT BETA HCG BLOOD, ED (MC, WL, AP ONLY): I-stat hCG, quantitative: <5 m[IU]/mL (ref <5)

## 2019-10-25 LAB — LIPASE, BLOOD: Lipase: 24 U/L (ref 11–51)

## 2019-10-25 LAB — SARS CORONAVIRUS 2 BY RT PCR (HOSPITAL ORDER, PERFORMED IN ~~LOC~~ HOSPITAL LAB): SARS Coronavirus 2: NEGATIVE

## 2019-10-25 MED ORDER — LORAZEPAM 2 MG/ML IJ SOLN
1.0000 mg | Freq: Once | INTRAMUSCULAR | Status: AC
  Administered 2019-10-25: 1 mg via INTRAVENOUS
  Filled 2019-10-25: qty 1

## 2019-10-25 MED ORDER — ONDANSETRON 4 MG PO TBDP
4.0000 mg | ORAL_TABLET | Freq: Once | ORAL | Status: AC | PRN
  Administered 2019-10-25: 4 mg via ORAL
  Filled 2019-10-25: qty 1

## 2019-10-25 MED ORDER — HYDROCORTISONE NA SUCCINATE PF 100 MG IJ SOLR
50.0000 mg | Freq: Three times a day (TID) | INTRAMUSCULAR | Status: DC
  Administered 2019-10-25 – 2019-10-27 (×5): 50 mg via INTRAVENOUS
  Filled 2019-10-25 (×5): qty 2

## 2019-10-25 MED ORDER — MORPHINE SULFATE (PF) 2 MG/ML IV SOLN
1.0000 mg | INTRAVENOUS | Status: DC | PRN
  Administered 2019-10-25 – 2019-10-26 (×5): 1 mg via INTRAVENOUS
  Filled 2019-10-25 (×6): qty 1

## 2019-10-25 MED ORDER — POTASSIUM CHLORIDE IN NACL 20-0.9 MEQ/L-% IV SOLN
INTRAVENOUS | Status: AC
  Filled 2019-10-25 (×3): qty 1000

## 2019-10-25 MED ORDER — ONDANSETRON HCL 4 MG/2ML IJ SOLN
4.0000 mg | Freq: Once | INTRAMUSCULAR | Status: AC
  Administered 2019-10-25: 4 mg via INTRAVENOUS
  Filled 2019-10-25: qty 2

## 2019-10-25 MED ORDER — ONDANSETRON HCL 4 MG PO TABS
4.0000 mg | ORAL_TABLET | Freq: Four times a day (QID) | ORAL | Status: DC | PRN
  Administered 2019-10-25: 4 mg via ORAL
  Filled 2019-10-25: qty 1

## 2019-10-25 MED ORDER — SODIUM CHLORIDE 0.9 % IV BOLUS
500.0000 mL | Freq: Once | INTRAVENOUS | Status: AC
  Administered 2019-10-25: 500 mL via INTRAVENOUS

## 2019-10-25 MED ORDER — PROMETHAZINE HCL 25 MG/ML IJ SOLN
12.5000 mg | Freq: Once | INTRAMUSCULAR | Status: AC
  Administered 2019-10-25: 12.5 mg via INTRAVENOUS
  Filled 2019-10-25: qty 1

## 2019-10-25 MED ORDER — SODIUM CHLORIDE 0.9 % IV BOLUS (SEPSIS)
1000.0000 mL | Freq: Once | INTRAVENOUS | Status: AC
  Administered 2019-10-25: 1000 mL via INTRAVENOUS

## 2019-10-25 MED ORDER — ONDANSETRON HCL 4 MG/2ML IJ SOLN: 4.0000 mg | Freq: Four times a day (QID) | INTRAMUSCULAR | Status: DC | PRN

## 2019-10-25 MED ORDER — MORPHINE SULFATE (PF) 2 MG/ML IV SOLN
2.0000 mg | Freq: Once | INTRAVENOUS | Status: AC
  Administered 2019-10-25: 2 mg via INTRAVENOUS
  Filled 2019-10-25: qty 1

## 2019-10-25 MED ORDER — SODIUM CHLORIDE 0.9% FLUSH
3.0000 mL | Freq: Once | INTRAVENOUS | Status: AC
  Administered 2019-10-26: 3 mL via INTRAVENOUS

## 2019-10-25 MED ORDER — ENOXAPARIN SODIUM 40 MG/0.4ML ~~LOC~~ SOLN
40.0000 mg | SUBCUTANEOUS | Status: DC
  Administered 2019-10-25 – 2019-10-26 (×2): 40 mg via SUBCUTANEOUS
  Filled 2019-10-25 (×2): qty 0.4

## 2019-10-25 MED ORDER — IOHEXOL 300 MG/ML  SOLN
100.0000 mL | Freq: Once | INTRAMUSCULAR | Status: AC | PRN
  Administered 2019-10-25: 100 mL via INTRAVENOUS

## 2019-10-25 MED ORDER — MORPHINE SULFATE (PF) 4 MG/ML IV SOLN
4.0000 mg | Freq: Once | INTRAVENOUS | Status: AC
  Administered 2019-10-25: 4 mg via INTRAVENOUS
  Filled 2019-10-25: qty 1

## 2019-10-25 MED ORDER — HALOPERIDOL LACTATE 5 MG/ML IJ SOLN
2.0000 mg | Freq: Once | INTRAMUSCULAR | Status: AC
  Administered 2019-10-25: 2 mg via INTRAVENOUS
  Filled 2019-10-25: qty 1

## 2019-10-25 MED ORDER — HYDRALAZINE HCL 20 MG/ML IJ SOLN
10.0000 mg | INTRAMUSCULAR | Status: DC | PRN
  Administered 2019-10-26 (×2): 10 mg via INTRAVENOUS
  Filled 2019-10-25 (×2): qty 1

## 2019-10-25 NOTE — ED Triage Notes (Signed)
Pt arrives to ED w/ c/o 10/10 centrally located abdominal pain w/ n/v/d that started at 0200 today. EMS VS: cbg 108, BP 190/100, hr 80, rr 30, 98% RA.

## 2019-10-25 NOTE — ED Provider Notes (Signed)
[facility] EMERGENCY DEPARTMENT Provider Note   CSN: [id number] Arrival date & time: 10/25/19  1326     History Chief Complaint  Patient presents with  . Abdominal Pain    Brittany Mullins is a 39 y.o. female.  Patient is a 39 year old female with past medical history of PCOS, seizure disorder, marijuana use, Addison's disease, hypertension presenting to the emergency department for abdominal pain, nausea, vomiting.  Patient reports that this started today.  Reports that she recently took a course of antibiotics for an upper respiratory infection but those symptoms have resolved.  She reports that she had a bowel movement this morning which was initially hard to pass but then was diarrhea and the vomiting started after that.  Reports this is the worst abdominal pain she has ever had.  Had similar symptoms back in January with a negative CT scan but reports that today symptoms are worse.  She denies any vaginal bleeding, vaginal discharge, pelvic pain or concern for sexually transmitted infections.        Past Medical History:  Diagnosis Date  . Addison's disease ([place])   . Brain tumor (benign) ([place])   . Hypertension   . Marijuana use    Per pt: "medical marijuana patient"  . Pancreatitis   . PCOS (polycystic ovarian syndrome)   . Pituitary tumor   . Seizures [facility])     Patient Active Problem List   Diagnosis Date Noted  . History of Addison's disease   . Intractable cyclical vomiting with nausea   . Opioid dependence ([place]) 06/03/2017  . Generalized abdominal pain   . Hypomagnesemia   . Marijuana use     Past Surgical History:  Procedure Laterality Date  . ABDOMINAL HYSTERECTOMY    . APPENDECTOMY    . CESAREAN SECTION    . CHOLECYSTECTOMY    . ELBOW SURGERY    . kidney stent    . pancreatic stent       OB History   No obstetric history on file.     Family History  Problem Relation Age of Onset  . Cancer Mother   . Hypertension Mother     . Diabetes Maternal Grandmother   . Diabetes Maternal Grandfather     Social History   Tobacco Use  . Smoking status: Never Smoker  . Smokeless tobacco: Never Used  Vaping Use  . Vaping Use: Never used  Substance Use Topics  . Alcohol use: No  . Drug use: Yes    Types: Marijuana    Home Medications Prior to Admission medications   Medication Sig Start Date End Date Taking? Authorizing Provider  acetaminophen (TYLENOL) 500 MG tablet Take 1,000 mg by mouth every 6 (six) hours as needed for headache.    [provider]  benzonatate (TESSALON) 100 MG capsule Take 1 capsule (100 mg total) by mouth every 8 (eight) hours. 10/19/19   Zigmund Gottron, NP  Cetirizine HCl 10 MG CAPS Take 1 capsule (10 mg total) by mouth daily for 10 days. 12/24/18 01/03/19  Wieters, Hallie C, PA-C  cloNIDine (CATAPRES) 0.2 MG tablet TAKE 1 TABLET(0.2 MG) BY MOUTH THREE TIMES DAILY 05/19/18   Rutherford Guys, MD  metroNIDAZOLE (FLAGYL) 500 MG tablet Take 1 tablet (500 mg total) by mouth 2 (two) times daily. 04/22/19   Fond[place], Kathleene Hazel, PA  ondansetron (ZOFRAN-ODT) 4 MG disintegrating tablet Take 1 tablet (4 mg total) by mouth every 8 (eight) hours as needed for nausea or vomiting.  10/19/19   Zigmund Gottron, NP  phenazopyridine (PYRIDIUM) 200 MG tablet Take 1 tablet (200 mg total) by mouth 3 (three) times daily. 12/24/18   Wieters, Hallie C, PA-C  promethazine (PHENERGAN) 25 MG tablet Take 1 tablet (25 mg total) by mouth every 6 (six) hours as needed for nausea or vomiting. 04/22/19   Tedd Sias, PA  SUMAtriptan (IMITREX) 6 MG/0.5ML SOLN injection Inject 6 mg into the skin every 2 (two) hours as needed for migraine or headache. May repeat in 2 hours if headache persists or recurs.    [provider]  LESSINA-28 0.1-20 MG-MCG tablet Take 1 tablet by mouth daily. 11/20/17 12/24/18  Tereasa Coop, PA-C  pantoprazole (PROTONIX) 40 MG tablet Take 1 tablet (40 mg total) by mouth 2 (two) times daily  before a meal. 06/05/17 12/24/18  Isaac Bliss, Rayford Halsted, MD    Allergies    Bee venom, Toradol [ketorolac tromethamine], and Benadryl [diphenhydramine]  Review of Systems   Review of Systems  Constitutional: Positive for appetite change. Negative for fatigue and fever.  HENT: Negative.   Respiratory: Negative for cough and shortness of breath.   Cardiovascular: Negative for chest pain.  Gastrointestinal: Positive for abdominal pain, diarrhea, nausea and vomiting.  Genitourinary: Negative for decreased urine volume, dysuria, hematuria, menstrual problem, pelvic pain, vaginal bleeding and vaginal discharge.  Musculoskeletal: Negative for back pain.  Skin: Negative for rash.  Neurological: Negative for dizziness, light-headedness and headaches.  All other systems reviewed and are negative.   Physical Exam Updated Vital Signs There were no vitals taken for this visit.  Physical Exam Vitals and nursing note reviewed.  Constitutional:      General: She is not in acute distress.    Appearance: Normal appearance. She is well-developed. She is not toxic-appearing or diaphoretic.     Comments: Appears uncomfortable and writhing in pain  HENT:     Head: Normocephalic.     Mouth/Throat:     Mouth: Mucous membranes are moist.  Eyes:     Conjunctiva/sclera: Conjunctivae normal.  Cardiovascular:     Rate and Rhythm: Regular rhythm. Tachycardia present.  Pulmonary:     Effort: Pulmonary effort is normal.  Abdominal:     General: Abdomen is flat. Bowel sounds are normal.     Palpations: Abdomen is soft.     Tenderness: There is abdominal tenderness in the left upper quadrant.  Skin:    General: Skin is dry.  Neurological:     Mental Status: She is alert.  Psychiatric:        Mood and Affect: Mood normal.     ED Results / Procedures / Treatments   Labs (all labs ordered are listed, but only abnormal results are displayed) Labs Reviewed  COMPREHENSIVE METABOLIC PANEL -  Abnormal; Notable for the following components:      Result Value   Potassium 3.4 (*)    Chloride 97 (*)    Glucose, Bld 104 (*)    All other components within normal limits  CBC - Abnormal; Notable for the following components:   Hemoglobin 15.2 (*)    All other components within normal limits  LIPASE, BLOOD  URINALYSIS, ROUTINE W REFLEX MICROSCOPIC  RAPID URINE DRUG SCREEN, HOSP PERFORMED  I-STAT BETA HCG BLOOD, ED ([facility], [facility], [facility] ONLY)    EKG None  Radiology No results found.  Procedures Procedures (including critical care time)  Medications Ordered in ED Medications  sodium chloride flush (NS) 0.9 % injection  3 mL (has no administration in time range)  sodium chloride 0.9 % bolus 1,000 mL (has no administration in time range)  ondansetron (ZOFRAN) injection 4 mg (has no administration in time range)  morphine 4 MG/ML injection 4 mg (has no administration in time range)  ondansetron (ZOFRAN-ODT) disintegrating tablet 4 mg (4 mg Oral Given 10/25/19 1337)    ED Course  I have reviewed the triage vital signs and the nursing notes.  Pertinent labs & imaging results that were available during my care of the patient were reviewed by me and considered in my medical decision making (see chart for details).  Clinical Course as of Oct 25 1515  Sun Oct 25, 2019  1457 Patient presenting with abdominal pain, nausea, vomiting which started today.  She appears very comfortable on exam and is writhing in pain and has some guarding in the left upper quadrant.  Her lab work however was benign appearing.  We are waiting on urinalysis.  She does have history of marijuana use.  Given the severity of her pain we will get a CT scan.  Fluids, Zofran, morphine ordered for her.  She declines the need for pelvic exam at this time.  If CT scan is negative would consider THC hyperemesis as the cause of her symptoms.  May need GI follow-up   [KM]  1505 Care signed out to [facility] PA due to change of  shift   [KM]    Clinical Course User Index [KM] Kristine Royal   MDM Rules/Calculators/A&P                           Final Clinical Impression(s) / ED Diagnoses Final diagnoses:  None    Rx / DC Orders ED Discharge Orders    None       Kristine Royal 10/25/19 1517    Little, Wenda Overland, MD 10/25/19 1559    Rex Kras, Wenda Overland, MD 11/05/19 [phone]

## 2019-10-25 NOTE — ED Provider Notes (Signed)
Patient is a 39 year old female whose care was transferred to me at shift change from [facility], PA-C.  Her HPI is below:  Patient is a 39 year old female with past medical history of PCOS, seizure disorder, marijuana use, Addison's disease, hypertension presenting to the emergency department for abdominal pain, nausea, vomiting.  Patient reports that this started today.  Reports that she recently took a course of antibiotics for an upper respiratory infection but those symptoms have resolved.  She reports that she had a bowel movement this morning which was initially hard to pass but then was diarrhea and the vomiting started after that.  Reports this is the worst abdominal pain she has ever had.  Had similar symptoms back in January with a negative CT scan but reports that today symptoms are worse.  She denies any vaginal bleeding, vaginal discharge, pelvic pain or concern for sexually transmitted infections.  Physical Exam  There were no vitals taken for this visit.  Physical Exam Physical Exam Vitals and nursing note reviewed.  Constitutional:      General: She is not in acute distress.    Appearance: Normal appearance. She is well-developed. She is not toxic-appearing or diaphoretic.     Comments: Appears uncomfortable and writhing in pain  HENT:     Head: Normocephalic.     Mouth/Throat:     Mouth: Mucous membranes are moist.  Eyes:     Conjunctiva/sclera: Conjunctivae normal.  Cardiovascular:     Rate and Rhythm: Regular rhythm. Tachycardia present.  Pulmonary:     Effort: Pulmonary effort is normal.  Abdominal:     General: Abdomen is flat. Bowel sounds are normal.     Palpations: Abdomen is soft.     Tenderness: There is abdominal tenderness in the left upper quadrant.  Skin:    General: Skin is dry.  Neurological:     Mental Status: She is alert.  Psychiatric:        Mood and Affect: Mood normal.  ED Course/Procedures   Clinical Course as of Oct 25 2135  Sun Oct 25, 2019  1457 Patient presenting with abdominal pain, nausea, vomiting which started today.  She appears very comfortable on exam and is writhing in pain and has some guarding in the left upper quadrant.  Her lab work however was benign appearing.  We are waiting on urinalysis.  She does have history of marijuana use.  Given the severity of her pain we will get a CT scan.  Fluids, Zofran, morphine ordered for her.  She declines the need for pelvic exam at this time.  If CT scan is negative would consider THC hyperemesis as the cause of her symptoms.  May need GI follow-up   [KM]  1505 Care signed out to [facility] PA due to change of shift   [KM]  1724 Reassessed patient and she is still complaining of pain as well as nausea. Will give Phenergan, additional IVF, additional morphine for pain. Will reassess.No acute findings in the abdomen or pelvis on CT scan. There were punctate renal stones that appear to be nonobstructing.   [LJ]  1731 Lipase: 24 [LJ]  1731 I-stat hCG, quantitative: <5.0 [LJ]  5809 Likely secondary to hemoconcentration  Hemoglobin(!): 15.2 [LJ]  1732 Likely secondary to dehydration  Ketones, ur(!): 80 [LJ]  1831 Tetrahydrocannabinol(!): POSITIVE [LJ]  1921 Patient reassessed and is still complaining of intractable nausea and vomiting as well as abdominal pain.  THC was positive.  Will give patient IV Haldol  and Ativan.  Will reassess.  If not improved, will likely admit.   [LJ]  2054 Patient is showing no improvement in regards to her pain as well as her nausea and vomiting.  Discussed possible admission with the patient and she is amenable.  Will obtain a COVID-19 test.  Will discuss with hospitalist for possible admission secondary to intractable nausea and vomiting.   [LJ]    Clinical Course User Index [KM] Alveria Apley, PA-C [LJ] Rayna Sexton, PA-C    Procedures  MDM   Pt is a 39 y.o. female that present with a history, physical exam, ED Clinical Course as  noted above.   Patient was transferred to me at shift change from [facility].  Patient has had 3 rounds of antiemetics as well as pain meds.  She still endorses continued abdominal pain as well as intractable nausea and vomiting.  THC positive.  Otherwise, labs and imaging are generally reassuring.  Will discuss with hospitalist team for possible admission secondary to intractable nausea and vomiting.  Note: Portions of this report may have been transcribed using voice recognition software. Every effort was made to ensure accuracy; however, inadvertent computerized transcription errors may be present.      Rayna Sexton, PA-C 10/25/19 2137    Dorie Rank, MD 10/26/19 [phone]

## 2019-10-25 NOTE — ED Notes (Signed)
Pt back from CT

## 2019-10-25 NOTE — ED Notes (Signed)
Theador Hawthorne would like a update on the patient and a call before she is actually transferred upstairs. His number is in her chart for contact.

## 2019-10-25 NOTE — H&P (Signed)
History and Physical    Brittany Mullins PPJ:[id number] DOB: 02-25-1981 DOA: 10/25/2019  PCP: Brittany Hy, PA-C  Patient coming from: Home.  Chief Complaint: Abdominal pain with nausea vomiting.  HPI: Brittany Mullins is a 39 y.o. female with history of Addison's disease with previous history of pancreatitis as per the patient and brain tumor per the chart presents to the ER with worsening abdominal pain with nausea vomiting and some diarrhea multiple episodes since last 24 hours.  Denies any blood in the vomitus or diarrhea.  Denies any fever chills.  Since the vomiting started patient also has been having global headache.  No focal deficits.  Patient had recently come to the ER last week for cough at the time patient was placed on Zithromax.  ED Course: In the ER patient's blood pressure is found to be elevated with systolic in the 580.  Labs are significant for mild hypokalemia CT abdomen pelvis was unremarkable patient appears nonfocal.  Given the intractable nausea vomiting despite giving multiple antiemetics patient admitted for further observation.  Abdomen appears benign.  Covid test was negative.  Review of Systems: As per HPI, rest all negative.   Past Medical History:  Diagnosis Date  . Addison's disease ([place])   . Brain tumor (benign) ([place])   . Hypertension   . Marijuana use    Per pt: "medical marijuana patient"  . Pancreatitis   . PCOS (polycystic ovarian syndrome)   . Pituitary tumor   . Seizures ([place])     Past Surgical History:  Procedure Laterality Date  . ABDOMINAL HYSTERECTOMY    . APPENDECTOMY    . CESAREAN SECTION    . CHOLECYSTECTOMY    . ELBOW SURGERY    . kidney stent    . pancreatic stent       reports that she has never smoked. She has never used smokeless tobacco. She reports current drug use. Drug: Marijuana. She reports that she does not drink alcohol.  Allergies  Allergen Reactions  . Bee Venom Anaphylaxis  . Toradol [Ketorolac  Tromethamine] Shortness Of Breath  . Benadryl [Diphenhydramine] Other (See Comments)    States she cannot breathe    Family History  Problem Relation Age of Onset  . Cancer Mother   . Hypertension Mother   . Diabetes Maternal Grandmother   . Diabetes Maternal Grandfather     Prior to Admission medications   Medication Sig Start Date End Date Taking? Authorizing Provider  azithromycin (ZITHROMAX) 250 MG tablet Take 250-500 mg by mouth See admin instructions. Take 500mg  on day 1 and then 250mg  daily on days 2 through 5.   Yes [provider]  benzonatate (TESSALON) 100 MG capsule Take 1 capsule (100 mg total) by mouth every 8 (eight) hours. 10/19/19  Yes Burky, Lanelle Bal B, NP  LESSINA-28 0.1-20 MG-MCG tablet Take 1 tablet by mouth daily. 08/26/19  Yes [provider]  ondansetron (ZOFRAN-ODT) 4 MG disintegrating tablet Take 1 tablet (4 mg total) by mouth every 8 (eight) hours as needed for nausea or vomiting. 10/19/19  Yes Burky, Lanelle Bal B, NP  predniSONE (DELTASONE) 20 MG tablet Take 20 mg by mouth in the morning and at bedtime. 5 day supply   Yes [provider]  Cetirizine HCl 10 MG CAPS Take 1 capsule (10 mg total) by mouth daily for 10 days. Patient not taking: Reported on 10/25/2019 12/24/18 10/25/19  Wieters, Hallie C, PA-C  cloNIDine (CATAPRES) 0.2 MG tablet TAKE 1 TABLET(0.2 MG) BY MOUTH THREE  TIMES DAILY Patient not taking: Reported on 10/25/2019 05/19/18   Rutherford Guys, MD  phenazopyridine (PYRIDIUM) 200 MG tablet Take 1 tablet (200 mg total) by mouth 3 (three) times daily. Patient not taking: Reported on 10/25/2019 12/24/18   Wieters, Madelynn Done C, PA-C  promethazine (PHENERGAN) 25 MG tablet Take 1 tablet (25 mg total) by mouth every 6 (six) hours as needed for nausea or vomiting. Patient not taking: Reported on 10/25/2019 04/22/19   Tedd Sias, PA  pantoprazole (PROTONIX) 40 MG tablet Take 1 tablet (40 mg total) by mouth 2 (two) times daily before a meal.  06/05/17 12/24/18  Isaac Bliss, Rayford Halsted, MD    Physical Exam: Constitutional: Moderately built and nourished. Vitals:   10/25/19 1600 10/25/19 1815 10/25/19 2100 10/25/19 2145  BP: (!) 183/97 (!) 195/109 (!) 177/112 (!) 186/78  Pulse: 68 80 86 (!) 35  SpO2: 100% 96% 98% 98%   Eyes: Anicteric no pallor. ENMT: No discharge from the ears eyes nose or mouth. Neck: No mass felt.  No neck rigidity. Respiratory: No rhonchi or crepitations. Cardiovascular: S1-S2 heard. Abdomen: Soft nontender bowel sounds present. Musculoskeletal: No edema. Skin: No rash. Neurologic: Alert awake oriented to time place and person.  Moves all extremities. Psychiatric: Appears normal per normal affect.   Labs on Admission: I have personally reviewed following labs and imaging studies  CBC: Recent Labs  Lab 10/25/19 1347  WBC 9.4  HGB 15.2*  HCT 44.4  MCV 89.7  PLT 924   Basic Metabolic Panel: Recent Labs  Lab 10/25/19 1347  NA 136  K 3.4*  CL 97*  CO2 24  GLUCOSE 104*  BUN 11  CREATININE 0.73  CALCIUM 9.8   GFR: Estimated Creatinine Clearance: 87.9 mL/min (by C-G formula based on SCr of 0.73 mg/dL). Liver Function Tests: Recent Labs  Lab 10/25/19 1347  AST 29  ALT 22  ALKPHOS 62  BILITOT 1.2  PROT 7.7  ALBUMIN 4.3   Recent Labs  Lab 10/25/19 1347  LIPASE 24   No results for input(s): AMMONIA in the last 168 hours. Coagulation Profile: No results for input(s): INR, PROTIME in the last 168 hours. Cardiac Enzymes: No results for input(s): CKTOTAL, CKMB, CKMBINDEX, TROPONINI in the last 168 hours. BNP (last 3 results) No results for input(s): PROBNP in the last 8760 hours. HbA1C: No results for input(s): HGBA1C in the last 72 hours. CBG: No results for input(s): GLUCAP in the last 168 hours. Lipid Profile: No results for input(s): CHOL, HDL, LDLCALC, TRIG, CHOLHDL, LDLDIRECT in the last 72 hours. Thyroid Function Tests: No results for input(s): TSH, T4TOTAL, FREET4,  T3FREE, THYROIDAB in the last 72 hours. Anemia Panel: No results for input(s): VITAMINB12, FOLATE, FERRITIN, TIBC, IRON, RETICCTPCT in the last 72 hours. Urine analysis:    Component Value Date/Time   COLORURINE YELLOW 10/25/2019 1702   APPEARANCEUR CLEAR 10/25/2019 1702   LABSPEC 1.024 10/25/2019 1702   PHURINE 8.0 10/25/2019 1702   GLUCOSEU NEGATIVE 10/25/2019 1702   HGBUR NEGATIVE 10/25/2019 1702   BILIRUBINUR NEGATIVE 10/25/2019 1702   BILIRUBINUR negative 06/12/2017 1053   KETONESUR 80 (A) 10/25/2019 1702   PROTEINUR 30 (A) 10/25/2019 1702   UROBILINOGEN 0.2 12/24/2018 1819   NITRITE NEGATIVE 10/25/2019 1702   LEUKOCYTESUR NEGATIVE 10/25/2019 1702   Sepsis Labs: @LABRCNTIP (procalcitonin:4,lacticidven:4) ) Recent Results (from the past 240 hour(s))  SARS CORONAVIRUS 2 (TAT 6-24 HRS) Nasopharyngeal Nasopharyngeal Swab     Status: None   Collection Time: 10/19/19 10:21 AM  Specimen: Nasopharyngeal Swab  Result Value Ref Range Status   SARS Coronavirus 2 NEGATIVE NEGATIVE Final    Comment: (NOTE) SARS-CoV-2 target nucleic acids are NOT DETECTED.  The SARS-CoV-2 RNA is generally detectable in upper and lower respiratory specimens during the acute phase of infection. Negative results do not preclude SARS-CoV-2 infection, do not rule out co-infections with other pathogens, and should not be used as the sole basis for treatment or other patient management decisions. Negative results must be combined with clinical observations, patient history, and epidemiological information. The expected result is Negative.  Fact Sheet for Patients: SugarRoll.be  Fact Sheet for Healthcare Providers: https://www.woods-mathews.com/  This test is not yet approved or cleared by the [place] FDA and  has been authorized for detection and/or diagnosis of SARS-CoV-2 by FDA under an Emergency Use Authorization (EUA). This EUA will remain  in effect  (meaning this test can be used) for the duration of the COVID-19 declaration under Se ction 564(b)(1) of the Act, 21 U.S.C. section 360bbb-3(b)(1), unless the authorization is terminated or revoked sooner.  Performed at [facility], [place] [address], [place], [place] [address]      Radiological Exams on Admission: CT ABDOMEN PELVIS W CONTRAST  Result Date: 10/25/2019 CLINICAL DATA:  Abdominal distension and pain with nausea, vomiting and diarrhea beginning today. EXAM: CT ABDOMEN AND PELVIS WITH CONTRAST TECHNIQUE: Multidetector CT imaging of the abdomen and pelvis was performed using the standard protocol following bolus administration of intravenous contrast. CONTRAST:  14mL OMNIPAQUE IOHEXOL 300 MG/ML  SOLN COMPARISON:  04/22/2019 FINDINGS: Lower chest: Lung bases are normal. Hepatobiliary: Previous cholecystectomy. Liver and biliary tree are normal. Pancreas: Normal. Spleen: Few small calcified granulomas, otherwise normal. Adrenals/Urinary Tract: Adrenal glands are normal. Kidneys are normal in size with a couple possible punctate nonobstructing renal stones present. No focal mass. No hydronephrosis. Ureters and bladder are normal. Stomach/Bowel: Stomach and small bowel are normal. Previous appendectomy. Colon is normal. Vascular/Lymphatic: Abdominal aorta is normal caliber. No adenopathy. Reproductive: Previous hysterectomy. Other: No free fluid or focal inflammatory change. Musculoskeletal: No focal abnormality. IMPRESSION: 1.  No acute findings in the abdomen/pelvis. 2. Suggestion of a couple bilateral punctate nonobstructing renal stones. Electronically Signed   By: Marin Olp M.D.   On: 10/25/2019 17:02     Assessment/Plan Principal Problem:   Intractable nausea and vomiting Active Problems:   Marijuana use   History of Addison's disease    1. Intractable nausea and vomiting -urine drug screen is positive for marijuana which probably could be contributing to patient's  symptoms.  We will keep patient on clear liquid antiemetics for now.  Pain relief medications which can be discontinued once patient can take oral methadone. 2. Chronic methadone therapy I discussed with pharmacy for dosing.  Once patient can take orally and discontinue pain relief medications. 3. Elevated blood pressure could be from vomiting and pain.  As needed IV hydralazine has been ordered follow blood pressure trend. 4. History of Addison's disease presently I am dosing IV hydrocortisone until patient can take oral prednisone. 5. Headache could be from migraine.  However patient chart mention that patient had a history of benign brain tumor for which I have ordered CT head. 6. Mild hypokalemia replace and recheck. 7. Previous history of pancreatitis no evidence in the CT at this time.   DVT prophylaxis: Lovenox. Code Status: Full code. Family Communication: Discussed with patient. Disposition Plan: Home. Consults called: None. Admission status: Observation.   Rise Patience  MD [facility] Pager [phone].  If 7PM-7AM, please contact night-coverage [email or web address] Password [facility]  10/25/2019, 9:55 PM

## 2019-10-26 ENCOUNTER — Observation Stay (HOSPITAL_COMMUNITY): Payer: Medicare Other

## 2019-10-26 ENCOUNTER — Other Ambulatory Visit: Payer: Self-pay

## 2019-10-26 DIAGNOSIS — F1290 Cannabis use, unspecified, uncomplicated: Secondary | ICD-10-CM | POA: Diagnosis present

## 2019-10-26 DIAGNOSIS — Z20822 Contact with and (suspected) exposure to covid-19: Secondary | ICD-10-CM | POA: Diagnosis present

## 2019-10-26 DIAGNOSIS — Z9689 Presence of other specified functional implants: Secondary | ICD-10-CM | POA: Diagnosis present

## 2019-10-26 DIAGNOSIS — Z833 Family history of diabetes mellitus: Secondary | ICD-10-CM | POA: Diagnosis not present

## 2019-10-26 DIAGNOSIS — Z79899 Other long term (current) drug therapy: Secondary | ICD-10-CM | POA: Diagnosis not present

## 2019-10-26 DIAGNOSIS — G40909 Epilepsy, unspecified, not intractable, without status epilepticus: Secondary | ICD-10-CM | POA: Diagnosis present

## 2019-10-26 DIAGNOSIS — I10 Essential (primary) hypertension: Secondary | ICD-10-CM | POA: Diagnosis present

## 2019-10-26 DIAGNOSIS — R112 Nausea with vomiting, unspecified: Principal | ICD-10-CM

## 2019-10-26 DIAGNOSIS — F1110 Opioid abuse, uncomplicated: Secondary | ICD-10-CM | POA: Diagnosis present

## 2019-10-26 DIAGNOSIS — Z86011 Personal history of benign neoplasm of the brain: Secondary | ICD-10-CM | POA: Diagnosis not present

## 2019-10-26 DIAGNOSIS — G894 Chronic pain syndrome: Secondary | ICD-10-CM | POA: Diagnosis present

## 2019-10-26 DIAGNOSIS — E876 Hypokalemia: Secondary | ICD-10-CM | POA: Diagnosis present

## 2019-10-26 DIAGNOSIS — E271 Primary adrenocortical insufficiency: Secondary | ICD-10-CM | POA: Diagnosis present

## 2019-10-26 DIAGNOSIS — Z91030 Bee allergy status: Secondary | ICD-10-CM | POA: Diagnosis not present

## 2019-10-26 DIAGNOSIS — Z7952 Long term (current) use of systemic steroids: Secondary | ICD-10-CM | POA: Diagnosis not present

## 2019-10-26 DIAGNOSIS — Z888 Allergy status to other drugs, medicaments and biological substances status: Secondary | ICD-10-CM | POA: Diagnosis not present

## 2019-10-26 DIAGNOSIS — Z79891 Long term (current) use of opiate analgesic: Secondary | ICD-10-CM | POA: Diagnosis not present

## 2019-10-26 DIAGNOSIS — Z8249 Family history of ischemic heart disease and other diseases of the circulatory system: Secondary | ICD-10-CM | POA: Diagnosis not present

## 2019-10-26 LAB — HEPATIC FUNCTION PANEL
ALT: 24 U/L (ref 0–44)
AST: 34 U/L (ref 15–41)
Albumin: 4.3 g/dL (ref 3.5–5.0)
Alkaline Phosphatase: 72 U/L (ref 38–126)
Bilirubin, Direct: 0.4 mg/dL — ABNORMAL HIGH (ref 0.0–0.2)
Indirect Bilirubin: 1.8 mg/dL — ABNORMAL HIGH (ref 0.3–0.9)
Total Bilirubin: 2.2 mg/dL — ABNORMAL HIGH (ref 0.3–1.2)
Total Protein: 8.5 g/dL — ABNORMAL HIGH (ref 6.5–8.1)

## 2019-10-26 LAB — BASIC METABOLIC PANEL
Anion gap: 18 — ABNORMAL HIGH (ref 5–15)
BUN: 7 mg/dL (ref 6–20)
CO2: 19 mmol/L — ABNORMAL LOW (ref 22–32)
Calcium: 9.6 mg/dL (ref 8.9–10.3)
Chloride: 95 mmol/L — ABNORMAL LOW (ref 98–111)
Creatinine, Ser: 0.76 mg/dL (ref 0.44–1.00)
GFR calc Af Amer: >60 mL/min (ref >60)
GFR calc non Af Amer: >60 mL/min (ref >60)
Glucose, Bld: 110 mg/dL — ABNORMAL HIGH (ref 70–99)
Potassium: 3.2 mmol/L — ABNORMAL LOW (ref 3.5–5.1)
Sodium: 132 mmol/L — ABNORMAL LOW (ref 135–145)

## 2019-10-26 LAB — CBC
HCT: 53.9 % — ABNORMAL HIGH (ref 36.0–46.0)
HCT: 52.3 % — ABNORMAL HIGH (ref 36.0–46.0)
Hemoglobin: 18.5 g/dL — ABNORMAL HIGH (ref 12.0–15.0)
Hemoglobin: 17.8 g/dL — ABNORMAL HIGH (ref 12.0–15.0)
MCH: 31.4 pg (ref 26.0–34.0)
MCH: 30.8 pg (ref 26.0–34.0)
MCHC: 34.3 g/dL (ref 30.0–36.0)
MCHC: 34 g/dL (ref 30.0–36.0)
MCV: 91.4 fL (ref 80.0–100.0)
MCV: 90.5 fL (ref 80.0–100.0)
Platelets: 259 10*3/uL (ref 150–400)
Platelets: 261 10*3/uL (ref 150–400)
RBC: 5.9 MIL/uL — ABNORMAL HIGH (ref 3.87–5.11)
RBC: 5.78 MIL/uL — ABNORMAL HIGH (ref 3.87–5.11)
RDW: 12 % (ref 11.5–15.5)
RDW: 12 % (ref 11.5–15.5)
WBC: 12.2 10*3/uL — ABNORMAL HIGH (ref 4.0–10.5)
WBC: 13.1 10*3/uL — ABNORMAL HIGH (ref 4.0–10.5)
nRBC: 0 % (ref 0.0–0.2)
nRBC: 0 % (ref 0.0–0.2)

## 2019-10-26 LAB — CREATININE, SERUM
Creatinine, Ser: 0.73 mg/dL (ref 0.44–1.00)
GFR calc Af Amer: >60 mL/min (ref >60)
GFR calc non Af Amer: >60 mL/min (ref >60)

## 2019-10-26 LAB — HIV ANTIBODY (ROUTINE TESTING W REFLEX): HIV Screen 4th Generation wRfx: NONREACTIVE

## 2019-10-26 MED ORDER — POTASSIUM CHLORIDE CRYS ER 20 MEQ PO TBCR
40.0000 meq | EXTENDED_RELEASE_TABLET | Freq: Once | ORAL | Status: AC
  Administered 2019-10-26: 40 meq via ORAL
  Filled 2019-10-26: qty 2

## 2019-10-26 MED ORDER — PROMETHAZINE HCL 25 MG/ML IJ SOLN
12.5000 mg | INTRAMUSCULAR | Status: DC | PRN
  Administered 2019-10-26 – 2019-10-27 (×3): 12.5 mg via INTRAVENOUS
  Filled 2019-10-26 (×3): qty 1

## 2019-10-26 MED ORDER — AMLODIPINE BESYLATE 5 MG PO TABS
5.0000 mg | ORAL_TABLET | Freq: Every day | ORAL | Status: DC
  Administered 2019-10-26: 5 mg via ORAL
  Filled 2019-10-26: qty 1

## 2019-10-26 MED ORDER — METHADONE HCL 10 MG PO TABS
110.0000 mg | ORAL_TABLET | Freq: Every day | ORAL | Status: DC
  Administered 2019-10-26 – 2019-10-27 (×2): 110 mg via ORAL
  Filled 2019-10-26 (×2): qty 11

## 2019-10-26 MED ORDER — ACETAMINOPHEN 325 MG PO TABS
650.0000 mg | ORAL_TABLET | Freq: Four times a day (QID) | ORAL | Status: DC | PRN
  Administered 2019-10-26: 650 mg via ORAL
  Filled 2019-10-26: qty 2

## 2019-10-26 MED ORDER — CLONIDINE HCL 0.2 MG PO TABS
0.2000 mg | ORAL_TABLET | Freq: Three times a day (TID) | ORAL | Status: DC
  Administered 2019-10-26 (×3): 0.2 mg via ORAL
  Filled 2019-10-26 (×2): qty 1

## 2019-10-26 MED ORDER — HYDROMORPHONE HCL 1 MG/ML IJ SOLN
1.0000 mg | Freq: Once | INTRAMUSCULAR | Status: AC
  Administered 2019-10-26: 1 mg via INTRAVENOUS
  Filled 2019-10-26: qty 1

## 2019-10-26 NOTE — ED Notes (Signed)
Lunch Tray Ordered @ 1027. °
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
## 2019-10-26 NOTE — ED Notes (Signed)
Pt transported to CT ?

## 2019-10-27 LAB — CBC WITH DIFFERENTIAL/PLATELET
Abs Immature Granulocytes: 0.04 10*3/uL (ref 0.00–0.07)
Basophils Absolute: 0 10*3/uL (ref 0.0–0.1)
Basophils Relative: 0 %
Eosinophils Absolute: 0 10*3/uL (ref 0.0–0.5)
Eosinophils Relative: 0 %
HCT: 41.3 % (ref 36.0–46.0)
Hemoglobin: 14.3 g/dL (ref 12.0–15.0)
Immature Granulocytes: 0 %
Lymphocytes Relative: 16 %
Lymphs Abs: 1.5 10*3/uL (ref 0.7–4.0)
MCH: 31.7 pg (ref 26.0–34.0)
MCHC: 34.6 g/dL (ref 30.0–36.0)
MCV: 91.6 fL (ref 80.0–100.0)
Monocytes Absolute: 0.9 10*3/uL (ref 0.1–1.0)
Monocytes Relative: 10 %
Neutro Abs: 6.9 10*3/uL (ref 1.7–7.7)
Neutrophils Relative %: 74 %
Platelets: 240 10*3/uL (ref 150–400)
RBC: 4.51 MIL/uL (ref 3.87–5.11)
RDW: 12.5 % (ref 11.5–15.5)
WBC: 9.4 10*3/uL (ref 4.0–10.5)
nRBC: 0 % (ref 0.0–0.2)

## 2019-10-27 LAB — BASIC METABOLIC PANEL
Anion gap: 8 (ref 5–15)
BUN: 18 mg/dL (ref 6–20)
CO2: 21 mmol/L — ABNORMAL LOW (ref 22–32)
Calcium: 8.9 mg/dL (ref 8.9–10.3)
Chloride: 103 mmol/L (ref 98–111)
Creatinine, Ser: 0.84 mg/dL (ref 0.44–1.00)
GFR calc Af Amer: >60 mL/min (ref >60)
GFR calc non Af Amer: >60 mL/min (ref >60)
Glucose, Bld: 105 mg/dL — ABNORMAL HIGH (ref 70–99)
Potassium: 4.7 mmol/L (ref 3.5–5.1)
Sodium: 132 mmol/L — ABNORMAL LOW (ref 135–145)

## 2019-10-27 MED ORDER — PROMETHAZINE HCL 12.5 MG PO TABS
12.5000 mg | ORAL_TABLET | Freq: Four times a day (QID) | ORAL | 0 refills | Status: DC | PRN
Start: 2019-10-27 — End: 2020-05-18

## 2019-10-27 NOTE — TOC CAGE-AID Note (Signed)
Transition of Care [facility]) - CAGE-AID Screening   Patient Details  Name: Nashly Olsson MRN: [id number] Date of Birth: 04-21-1980  [facility]) CM/SW Contact:    Emeterio Reeve, [place] Phone Number: 10/27/2019, 12:24 PM   Clinical Narrative:  CSW met with pt at bedside. CSW introduced self and explained her role at the hospital.  Pt denied alcohol use. 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 Pt says she has been on methadone for about 9 years and currently receives treatment at [facility] treatment center. Pt reports she is doing well in treatment. Pt also states she smokes marijuana and has not interest in stopping. Pt was receptive to counseling. Pt denied resources and Scientist, clinical (histocompatibility and immunogenetics).   CAGE-AID Screening:    Have You Ever Felt You Ought to Cut Down on Your Drinking or Drug Use?: No Have People Annoyed You By Critizing Your Drinking Or Drug Use?: No Have You Felt Bad Or Guilty About Your Drinking Or Drug Use?: No Have You Ever Had a Drink or Used Drugs First Thing In The Morning to Steady Your Nerves or to Get Rid of a Hangover?: No CAGE-AID Score: 0  Substance Abuse Education Offered: Yes  Substance abuse interventions: Patient Counseling  Emeterio Reeve, Latanya Presser, [place] Social Worker [phone]

## 2019-10-27 NOTE — Discharge Summary (Signed)
Physician Discharge Summary  Brittany Mullins JQB:[id number] DOB: 1980/08/29 DOA: 10/25/2019  PCP: Julian Hy, PA-C  Admit date: 10/25/2019 Discharge date: 10/27/2019  Admitted From: Home Disposition:  Home  Discharge Condition:Stable CODE STATUS:FULL Diet recommendation: Regular    Brief/Interim Summary:  Patient is a 39 year old female with history of Addison's disease, chronic pain syndrome on methadone, marijuana abuse, history of pancreatitis who presents to the emergency room with complaints of intractable nausea, vomiting, abdominal pain.  On presentation she was hypertensive.  She was found to have mild hypokalemia.  CT abdomen/pelvis was unremarkable.  She was admitted for further management of intractable nausea and vomiting.  Her hospital course remained stable.  Currently she is hemodynamically stable.  Nausea and vomiting has resolved.  She is medically stable for discharge to home today.  Following problems were addressed during her hospitalization:  Intractable nausea and vomiting: Could be due to marijuana though patient does not agree with this.  UDS was positive for marijuana.    CT abdomen pelvis did not show any acute intra-abdominal abnormalities.Lipase normal Her symptoms have resolved  Chronic pain syndrome: Patient has been on methadone for several years.  Follow up with pain management as an outpatient  Headache: Improved with tylenol.  CT head did not show any intra cranial  abnormality  Hypertension:Blood pressure currently stopped. No need of antihypertensives on discharge  History of addisons disease: Takes prednisone at home.  She was on  IV hydrocortisone here.  Hypokalemia: Supplemented  History of pancreatitis: Presented with abdominal pain.  Very low suspicion for pancreatitis at this time.  Lipase is normal.  Denies any alcohol intake.  Leukocytosis: Mild and has resolved.  Could be associated with chronic steroid use.  Continue to  monitor.  Substance abuse: UDS positive for opiate, tetrahydrocannabinol.  Counseled for cessation.   Discharge Diagnoses:  Principal Problem:   Intractable nausea and vomiting Active Problems:   Marijuana use   History of Addison's disease    Discharge Instructions  Discharge Instructions    Diet general   Complete by: As directed    Discharge instructions   Complete by: As directed    1)Please follow up with your primary care physician as an outpatient.   Increase activity slowly   Complete by: As directed      Allergies as of 10/27/2019      Reactions   Bee Venom Anaphylaxis   Toradol [ketorolac Tromethamine] Shortness Of Breath   Benadryl [diphenhydramine] Other (See Comments)   States she cannot breathe      Medication List    STOP taking these medications   azithromycin 250 MG tablet Commonly known as: ZITHROMAX   cloNIDine 0.2 MG tablet Commonly known as: CATAPRES   predniSONE 20 MG tablet Commonly known as: DELTASONE     TAKE these medications   benzonatate 100 MG capsule Commonly known as: TESSALON Take 1 capsule (100 mg total) by mouth every 8 (eight) hours.   Cetirizine HCl 10 MG Caps Take 1 capsule (10 mg total) by mouth daily for 10 days.   LESSINA-28 0.1-20 MG-MCG tablet Generic drug: levonorgestrel-ethinyl estradiol Take 1 tablet by mouth daily.   methadone 10 MG/ML solution Commonly known as: DOLOPHINE Take 110 mg by mouth daily.   ondansetron 4 MG disintegrating tablet Commonly known as: ZOFRAN-ODT Take 1 tablet (4 mg total) by mouth every 8 (eight) hours as needed for nausea or vomiting.   phenazopyridine 200 MG tablet Commonly known as: PYRIDIUM Take 1 tablet (200 mg  total) by mouth 3 (three) times daily.   promethazine 25 MG tablet Commonly known as: PHENERGAN Take 1 tablet (25 mg total) by mouth every 6 (six) hours as needed for nausea or vomiting.       Follow-up Information    Julian Hy, PA-C. Schedule an  appointment as soon as possible for a visit in 1 week(s).   Specialty: Physician Assistant Contact information: [place] [phone] [phone]              Allergies  Allergen Reactions  . Bee Venom Anaphylaxis  . Toradol [Ketorolac Tromethamine] Shortness Of Breath  . Benadryl [Diphenhydramine] Other (See Comments)    States she cannot breathe    Consultations:  None   Procedures/Studies: DG Chest 2 View  Result Date: 10/19/2019 CLINICAL DATA:  Cough, shortness of breath and wheezing for 3 days. EXAM: CHEST - 2 VIEW COMPARISON:  12/21/2017 FINDINGS: The cardiac silhouette, mediastinal and hilar contours are within normal limits and stable. Mild bronchitic lung changes could suggest bronchitis. No infiltrates or effusions. The bony thorax is intact. IMPRESSION: Probable bronchitis.  No infiltrates or effusions. Electronically Signed   By: Marijo Sanes M.D.   On: 10/19/2019 11:27   CT HEAD WO CONTRAST  Result Date: 10/26/2019 CLINICAL DATA:  Headache, intracranial hemorrhage suspected. Additional history provided: Patient reports abdominal pain, nausea, vomiting, diarrhea, frontal headache EXAM: CT HEAD WITHOUT CONTRAST TECHNIQUE: Contiguous axial images were obtained from the base of the skull through the vertex without intravenous contrast. COMPARISON:  Head CT 12/22/2017 FINDINGS: Brain: Cerebral volume is normal. There is no acute intracranial hemorrhage. No demarcated cortical infarct. No extra-axial fluid collection. No evidence of intracranial mass. No midline shift. Partially empty sella turcica. Vascular: No hyperdense vessel. Skull: Normal. Negative for fracture or focal lesion. Sinuses/Orbits: Visualized orbits show no acute finding. Paranasal sinus disease. Most notably, there is moderate scattered mucosal thickening and frothy secretions within the bilateral ethmoid air cells. Mild mucosal thickening and frothy secretions within the left  maxillary sinus. Mild left sphenoid sinus mucosal thickening. No significant mastoid effusion. IMPRESSION: No CT evidence of acute intracranial abnormality. Redemonstrated partially empty sella turcica. This is very commonly an incidental finding, but can be associated with idiopathic intracranial hypertension. Paranasal sinus disease as described with mucosal thickening and frothy secretions. Correlate for acute sinusitis. Electronically Signed   By: Kellie Simmering DO   On: 10/26/2019 07:58   CT ABDOMEN PELVIS W CONTRAST  Result Date: 10/25/2019 CLINICAL DATA:  Abdominal distension and pain with nausea, vomiting and diarrhea beginning today. EXAM: CT ABDOMEN AND PELVIS WITH CONTRAST TECHNIQUE: Multidetector CT imaging of the abdomen and pelvis was performed using the standard protocol following bolus administration of intravenous contrast. CONTRAST:  164mL OMNIPAQUE IOHEXOL 300 MG/ML  SOLN COMPARISON:  04/22/2019 FINDINGS: Lower chest: Lung bases are normal. Hepatobiliary: Previous cholecystectomy. Liver and biliary tree are normal. Pancreas: Normal. Spleen: Few small calcified granulomas, otherwise normal. Adrenals/Urinary Tract: Adrenal glands are normal. Kidneys are normal in size with a couple possible punctate nonobstructing renal stones present. No focal mass. No hydronephrosis. Ureters and bladder are normal. Stomach/Bowel: Stomach and small bowel are normal. Previous appendectomy. Colon is normal. Vascular/Lymphatic: Abdominal aorta is normal caliber. No adenopathy. Reproductive: Previous hysterectomy. Other: No free fluid or focal inflammatory change. Musculoskeletal: No focal abnormality. IMPRESSION: 1.  No acute findings in the abdomen/pelvis. 2. Suggestion of a couple bilateral punctate nonobstructing renal stones. Electronically Signed   By: Marin Olp  M.D.   On: 10/25/2019 17:02       Subjective: Patient seen and examined at the bedside this morning.  Hemodynamically stable for discharge  today.  Discharge Exam: Vitals:   10/27/19 0533 10/27/19 1129  BP: (!) 105/63 99/69  Pulse: 60 78  Resp: 18 18  Temp: 97.9 F (36.6 C) 98.7 F (37.1 C)  SpO2: 97% 98%   Vitals:   10/26/19 1801 10/26/19 2318 10/27/19 0533 10/27/19 1129  BP: (!) 90/55 (!) 96/61 (!) 105/63 99/69  Pulse: 70 60 60 78  Resp: 16 18 18 18   Temp: 97.6 F (36.4 C) 97.9 F (36.6 C) 97.9 F (36.6 C) 98.7 F (37.1 C)  TempSrc: Oral Oral Oral Oral  SpO2: 96% 94% 97% 98%    General: Pt is alert, awake, not in acute distress Cardiovascular: RRR, S1/S2 +, no rubs, no gallops Respiratory: CTA bilaterally, no wheezing, no rhonchi Abdominal: Soft, NT, ND, bowel sounds + Extremities: no edema, no cyanosis    The results of significant diagnostics from this hospitalization (including imaging, microbiology, ancillary and laboratory) are listed below for reference.     Microbiology: Recent Results (from the past 240 hour(s))  SARS CORONAVIRUS 2 (TAT 6-24 HRS) Nasopharyngeal Nasopharyngeal Swab     Status: None   Collection Time: 10/19/19 10:21 AM   Specimen: Nasopharyngeal Swab  Result Value Ref Range Status   SARS Coronavirus 2 NEGATIVE NEGATIVE Final    Comment: (NOTE) SARS-CoV-2 target nucleic acids are NOT DETECTED.  The SARS-CoV-2 RNA is generally detectable in upper and lower respiratory specimens during the acute phase of infection. Negative results do not preclude SARS-CoV-2 infection, do not rule out co-infections with other pathogens, and should not be used as the sole basis for treatment or other patient management decisions. Negative results must be combined with clinical observations, patient history, and epidemiological information. The expected result is Negative.  Fact Sheet for Patients: SugarRoll.be  Fact Sheet for Healthcare Providers: https://www.woods-mathews.com/  This test is not yet approved or cleared by the [place] FDA and   has been authorized for detection and/or diagnosis of SARS-CoV-2 by FDA under an Emergency Use Authorization (EUA). This EUA will remain  in effect (meaning this test can be used) for the duration of the COVID-19 declaration under Se ction 564(b)(1) of the Act, 21 U.S.C. section 360bbb-3(b)(1), unless the authorization is terminated or revoked sooner.  Performed at [facility], [place] [address], [place], [place] [address]   SARS Coronavirus 2 by RT PCR (hospital order, performed in [facility] hospital lab) Nasopharyngeal Nasopharyngeal Swab     Status: None   Collection Time: 10/25/19  8:58 PM   Specimen: Nasopharyngeal Swab  Result Value Ref Range Status   SARS Coronavirus 2 NEGATIVE NEGATIVE Final    Comment: (NOTE) SARS-CoV-2 target nucleic acids are NOT DETECTED.  The SARS-CoV-2 RNA is generally detectable in upper and lower respiratory specimens during the acute phase of infection. The lowest concentration of SARS-CoV-2 viral copies this assay can detect is 250 copies / mL. A negative result does not preclude SARS-CoV-2 infection and should not be used as the sole basis for treatment or other patient management decisions.  A negative result may occur with improper specimen collection / handling, submission of specimen other than nasopharyngeal swab, presence of viral mutation(s) within the areas targeted by this assay, and inadequate number of viral copies (<250 copies / mL). A negative result must be combined with clinical observations, patient history, and  epidemiological information.  Fact Sheet for Patients:   StrictlyIdeas.no  Fact Sheet for Healthcare Providers: BankingDealers.co.za  This test is not yet approved or  cleared by the [place] FDA and has been authorized for detection and/or diagnosis of SARS-CoV-2 by FDA under an Emergency Use Authorization (EUA).  This EUA will remain in effect (meaning this  test can be used) for the duration of the COVID-19 declaration under Section 564(b)(1) of the Act, 21 U.S.C. section 360bbb-3(b)(1), unless the authorization is terminated or revoked sooner.  Performed at [facility], [place] [address], [place], [place] [address]      Labs: BNP (last 3 results) No results for input(s): BNP in the last 8760 hours. Basic Metabolic Panel: Recent Labs  Lab 10/25/19 1347 10/25/19 2151 10/26/19 0600 10/27/19 0427  NA 136  --  132* 132*  K 3.4*  --  3.2* 4.7  CL 97*  --  95* 103  CO2 24  --  19* 21*  GLUCOSE 104*  --  110* 105*  BUN 11  --  7 18  CREATININE 0.73 0.73 0.76 0.84  CALCIUM 9.8  --  9.6 8.9   Liver Function Tests: Recent Labs  Lab 10/25/19 1347 10/26/19 0600  AST 29 34  ALT 22 24  ALKPHOS 62 72  BILITOT 1.2 2.2*  PROT 7.7 8.5*  ALBUMIN 4.3 4.3   Recent Labs  Lab 10/25/19 1347  LIPASE 24   No results for input(s): AMMONIA in the last 168 hours. CBC: Recent Labs  Lab 10/25/19 1347 10/25/19 2151 10/26/19 0419 10/27/19 0427  WBC 9.4 12.2* 13.1* 9.4  NEUTROABS  --   --   --  6.9  HGB 15.2* 18.5* 17.8* 14.3  HCT 44.4 53.9* 52.3* 41.3  MCV 89.7 91.4 90.5 91.6  PLT 234 259 261 240   Cardiac Enzymes: No results for input(s): CKTOTAL, CKMB, CKMBINDEX, TROPONINI in the last 168 hours. BNP: Invalid input(s): POCBNP CBG: No results for input(s): GLUCAP in the last 168 hours. D-Dimer No results for input(s): DDIMER in the last 72 hours. Hgb A1c No results for input(s): HGBA1C in the last 72 hours. Lipid Profile No results for input(s): CHOL, HDL, LDLCALC, TRIG, CHOLHDL, LDLDIRECT in the last 72 hours. Thyroid function studies No results for input(s): TSH, T4TOTAL, T3FREE, THYROIDAB in the last 72 hours.  Invalid input(s): FREET3 Anemia work up No results for input(s): VITAMINB12, FOLATE, FERRITIN, TIBC, IRON, RETICCTPCT in the last 72 hours. Urinalysis    Component Value Date/Time   COLORURINE YELLOW  10/25/2019 1702   APPEARANCEUR CLEAR 10/25/2019 1702   LABSPEC 1.024 10/25/2019 1702   PHURINE 8.0 10/25/2019 1702   GLUCOSEU NEGATIVE 10/25/2019 1702   HGBUR NEGATIVE 10/25/2019 1702   BILIRUBINUR NEGATIVE 10/25/2019 1702   BILIRUBINUR negative 06/12/2017 1053   KETONESUR 80 (A) 10/25/2019 1702   PROTEINUR 30 (A) 10/25/2019 1702   UROBILINOGEN 0.2 12/24/2018 1819   NITRITE NEGATIVE 10/25/2019 1702   LEUKOCYTESUR NEGATIVE 10/25/2019 1702   Sepsis Labs Invalid input(s): PROCALCITONIN,  WBC,  LACTICIDVEN Microbiology Recent Results (from the past 240 hour(s))  SARS CORONAVIRUS 2 (TAT 6-24 HRS) Nasopharyngeal Nasopharyngeal Swab     Status: None   Collection Time: 10/19/19 10:21 AM   Specimen: Nasopharyngeal Swab  Result Value Ref Range Status   SARS Coronavirus 2 NEGATIVE NEGATIVE Final    Comment: (NOTE) SARS-CoV-2 target nucleic acids are NOT DETECTED.  The SARS-CoV-2 RNA is generally detectable in upper and lower respiratory specimens during  the acute phase of infection. Negative results do not preclude SARS-CoV-2 infection, do not rule out co-infections with other pathogens, and should not be used as the sole basis for treatment or other patient management decisions. Negative results must be combined with clinical observations, patient history, and epidemiological information. The expected result is Negative.  Fact Sheet for Patients: SugarRoll.be  Fact Sheet for Healthcare Providers: https://www.woods-mathews.com/  This test is not yet approved or cleared by the [place] FDA and  has been authorized for detection and/or diagnosis of SARS-CoV-2 by FDA under an Emergency Use Authorization (EUA). This EUA will remain  in effect (meaning this test can be used) for the duration of the COVID-19 declaration under Se ction 564(b)(1) of the Act, 21 U.S.C. section 360bbb-3(b)(1), unless the authorization is terminated or revoked  sooner.  Performed at [facility], [place] [address] [place], [place] [address]   SARS Coronavirus 2 by RT PCR (hospital order, performed in [facility] hospital lab) Nasopharyngeal Nasopharyngeal Swab     Status: None   Collection Time: 10/25/19  8:58 PM   Specimen: Nasopharyngeal Swab  Result Value Ref Range Status   SARS Coronavirus 2 NEGATIVE NEGATIVE Final    Comment: (NOTE) SARS-CoV-2 target nucleic acids are NOT DETECTED.  The SARS-CoV-2 RNA is generally detectable in upper and lower respiratory specimens during the acute phase of infection. The lowest concentration of SARS-CoV-2 viral copies this assay can detect is 250 copies / mL. A negative result does not preclude SARS-CoV-2 infection and should not be used as the sole basis for treatment or other patient management decisions.  A negative result may occur with improper specimen collection / handling, submission of specimen other than nasopharyngeal swab, presence of viral mutation(s) within the areas targeted by this assay, and inadequate number of viral copies (<250 copies / mL). A negative result must be combined with clinical observations, patient history, and epidemiological information.  Fact Sheet for Patients:   StrictlyIdeas.no  Fact Sheet for Healthcare Providers: BankingDealers.co.za  This test is not yet approved or  cleared by the [place] FDA and has been authorized for detection and/or diagnosis of SARS-CoV-2 by FDA under an Emergency Use Authorization (EUA).  This EUA will remain in effect (meaning this test can be used) for the duration of the COVID-19 declaration under Section 564(b)(1) of the Act, 21 U.S.C. section 360bbb-3(b)(1), unless the authorization is terminated or revoked sooner.  Performed at [facility], [place] [address], [place], [place] [address]     Please note: You were cared for by a hospitalist during your hospital  stay. Once you are discharged, your primary care physician will handle any further medical issues. Please note that NO REFILLS for any discharge medications will be authorized once you are discharged, as it is imperative that you return to your primary care physician (or establish a relationship with a primary care physician if you do not have one) for your post hospital discharge needs so that they can reassess your need for medications and monitor your lab values.    Time coordinating discharge: 40 minutes  SIGNED:   Shelly Coss, MD  [facility] 10/27/2019, 11:34 AM Pager [phone]  If 7PM-7AM, please contact night-coverage [email or web address] Password [email or web address]

## 2020-05-15 ENCOUNTER — Other Ambulatory Visit: Payer: Self-pay

## 2020-05-15 ENCOUNTER — Emergency Department (HOSPITAL_COMMUNITY)
Admission: EM | Admit: 2020-05-15 | Discharge: 2020-05-15 | Disposition: A | Discharge status: Home / Self Care | Payer: Medicare Other | Attending: Emergency Medicine | Admitting: Emergency Medicine

## 2020-05-15 DIAGNOSIS — H66002 Acute suppurative otitis media without spontaneous rupture of ear drum, left ear: Secondary | ICD-10-CM | POA: Diagnosis not present

## 2020-05-15 DIAGNOSIS — I10 Essential (primary) hypertension: Secondary | ICD-10-CM | POA: Insufficient documentation

## 2020-05-15 DIAGNOSIS — N390 Urinary tract infection, site not specified: Secondary | ICD-10-CM

## 2020-05-15 DIAGNOSIS — B349 Viral infection, unspecified: Secondary | ICD-10-CM

## 2020-05-15 DIAGNOSIS — Z20822 Contact with and (suspected) exposure to covid-19: Secondary | ICD-10-CM | POA: Diagnosis not present

## 2020-05-15 DIAGNOSIS — H9202 Otalgia, left ear: Secondary | ICD-10-CM | POA: Diagnosis present

## 2020-05-15 DIAGNOSIS — Z86011 Personal history of benign neoplasm of the brain: Secondary | ICD-10-CM | POA: Insufficient documentation

## 2020-05-15 LAB — URINALYSIS, ROUTINE W REFLEX MICROSCOPIC
Bilirubin Urine: NEGATIVE
Glucose, UA: NEGATIVE mg/dL
Hgb urine dipstick: NEGATIVE
Ketones, ur: NEGATIVE mg/dL
Nitrite: NEGATIVE
Protein, ur: NEGATIVE mg/dL
Specific Gravity, Urine: 1.01 (ref 1.005–1.030)
pH: 6.5 (ref 5.0–8.0)

## 2020-05-15 LAB — URINALYSIS, MICROSCOPIC (REFLEX)

## 2020-05-15 MED ORDER — IBUPROFEN 800 MG PO TABS
800.0000 mg | ORAL_TABLET | Freq: Once | ORAL | Status: AC
  Administered 2020-05-15: 800 mg via ORAL
  Filled 2020-05-15: qty 1

## 2020-05-15 MED ORDER — ACETAMINOPHEN 325 MG PO TABS
650.0000 mg | ORAL_TABLET | Freq: Once | ORAL | Status: AC
  Administered 2020-05-15: 650 mg via ORAL

## 2020-05-15 MED ORDER — FLUTICASONE PROPIONATE 50 MCG/ACT NA SUSP
1.0000 | Freq: Every day | NASAL | 0 refills | Status: DC
Start: 2020-05-15 — End: 2021-08-15

## 2020-05-15 MED ORDER — AMOXICILLIN-POT CLAVULANATE 875-125 MG PO TABS
1.0000 | ORAL_TABLET | Freq: Two times a day (BID) | ORAL | 0 refills | Status: DC
Start: 2020-05-15 — End: 2020-05-18

## 2020-05-15 NOTE — Discharge Instructions (Signed)
Take antibiotics to help treat the ear infection and urinary infection.  Take entire course, even if symptoms improve. Use Tylenol or ibuprofen as needed for pain. Use a nasal spray daily to help with nasal congestion to relieve pressure of her ear. Your Covid test is pending, results should return in 6 to 24 hours.  If positive, you should receive a phone call.  If negative, you will not.  Either way, you may check online on MyChart. Follow-up with your primary care doctor as needed if your symptoms persist. Return to the emergency room with any new, worsening, concerning symptoms

## 2020-05-15 NOTE — ED Triage Notes (Signed)
Pt presents to ED POV. Pt c/o L ear pain and hearing loss. Pt reports that she has had sore throat, congestion for a few days. Pt's son recently tested +strep.

## 2020-05-15 NOTE — ED Provider Notes (Signed)
[facility] EMERGENCY DEPARTMENT Provider Note   CSN: [id number] Arrival date & time: 05/15/20  1837     History Chief Complaint  Patient presents with  . Otalgia    Brittany Mullins is a 40 y.o. female presenting for evaluation of left ear pain, sore throat, chest congestion, cough.  Patient states for the past few days, she has had nasal congestion, sore throat, cough.  Cough is productive of phlegm.  Today she woke up from a nap with severe left ear pain.  She also reports decreased hearing on that side.  Denies pain on the right side.  No fevers or chills.  She denies chest pain, shortness breath, nausea, vomiting, abdominal pain.  She states over the past week, she has had intermittent urinary symptoms, initially with dysuria.  Now with frequency, urgency, and abnormal odor.  No change in bowel movements.  Patient has a family member who is strep positive.  Patient has not been tested for Covid since her symptoms began.  She is unvaccinated for COVID.  She reports a history of brain tumor and Addison's disease, but is not currently on any medications.  No immunosuppression.  Additional history obtained from chart review.  Per chart review, patient with a history of PCOS, Addison's, benign brain tumor, seizures, remote history of opioid use.  HPI     Past Medical History:  Diagnosis Date  . Addison's disease ([place])   . Brain tumor (benign) ([place])   . Hypertension   . Marijuana use    Per pt: "medical marijuana patient"  . Pancreatitis   . PCOS (polycystic ovarian syndrome)   . Pituitary tumor   . Seizures [facility])     Patient Active Problem List   Diagnosis Date Noted  . Intractable nausea and vomiting 10/25/2019  . History of Addison's disease   . Intractable cyclical vomiting with nausea   . Opioid dependence ([place]) 06/03/2017  . Generalized abdominal pain   . Hypomagnesemia   . Marijuana use     Past Surgical History:  Procedure Laterality Date  .  ABDOMINAL HYSTERECTOMY    . APPENDECTOMY    . CESAREAN SECTION    . CHOLECYSTECTOMY    . ELBOW SURGERY    . kidney stent    . pancreatic stent       OB History   No obstetric history on file.     Family History  Problem Relation Age of Onset  . Cancer Mother   . Hypertension Mother   . Diabetes Maternal Grandmother   . Diabetes Maternal Grandfather     Social History   Tobacco Use  . Smoking status: Never Smoker  . Smokeless tobacco: Never Used  Vaping Use  . Vaping Use: Never used  Substance Use Topics  . Alcohol use: No  . Drug use: Yes    Types: Marijuana    Home Medications Prior to Admission medications   Medication Sig Start Date End Date Taking? Authorizing Provider  amoxicillin-clavulanate (AUGMENTIN) 875-125 MG tablet Take 1 tablet by mouth 2 (two) times daily. 05/15/20  Yes Evangaline Jou, PA-C  fluticasone (FLONASE) 50 MCG/ACT nasal spray Place 1 spray into both nostrils daily. 05/15/20  Yes Trinton Prewitt, PA-C  benzonatate (TESSALON) 100 MG capsule Take 1 capsule (100 mg total) by mouth every 8 (eight) hours. 10/19/19   Zigmund Gottron, NP  LESSINA-28 0.1-20 MG-MCG tablet Take 1 tablet by mouth daily. 08/26/19   [provider]  methadone (DOLOPHINE) 10 MG/ML  solution Take 110 mg by mouth daily.    [provider]  ondansetron (ZOFRAN-ODT) 4 MG disintegrating tablet Take 1 tablet (4 mg total) by mouth every 8 (eight) hours as needed for nausea or vomiting. 10/19/19   Zigmund Gottron, NP  predniSONE (DELTASONE) 20 MG tablet Take 20 mg by mouth in the morning and at bedtime. 5 day supply    [provider]  promethazine (PHENERGAN) 12.5 MG tablet Take 1 tablet (12.5 mg total) by mouth every 6 (six) hours as needed for nausea or vomiting. 10/27/19   Shelly Coss, MD  pantoprazole (PROTONIX) 40 MG tablet Take 1 tablet (40 mg total) by mouth 2 (two) times daily before a meal. 06/05/17 12/24/18  Isaac Bliss, Rayford Halsted, MD     Allergies    Bee venom, Toradol [ketorolac tromethamine], and Benadryl [diphenhydramine]  Review of Systems   Review of Systems  HENT: Positive for congestion, ear pain and sore throat.   Respiratory: Positive for cough.   Genitourinary: Positive for dysuria, frequency and urgency.  All other systems reviewed and are negative.   Physical Exam Updated Vital Signs BP (!) 162/105 (BP Location: Right Arm)   Pulse 72   Temp 98.6 F (37 C) (Oral)   Resp 18   SpO2 98%   Physical Exam Vitals and nursing note reviewed.  Constitutional:      General: She is not in acute distress.    Appearance: She is well-developed and well-nourished.     Comments: Resting in the bed in no acute distress  HENT:     Head: Normocephalic and atraumatic.     Comments: Nasal congestion noted.  OP clear without tonsillar swelling or exudate.  Uvula midline with equal palate rise.  No muffled voice.  Feeling secretions easily.  Left TM erythematous and bulging.  Right TM normal. Eyes:     Extraocular Movements: Extraocular movements intact and EOM normal.     Conjunctiva/sclera: Conjunctivae normal.     Pupils: Pupils are equal, round, and reactive to light.  Cardiovascular:     Rate and Rhythm: Normal rate and regular rhythm.     Pulses: Normal pulses and intact distal pulses.  Pulmonary:     Effort: Pulmonary effort is normal. No respiratory distress.     Breath sounds: Normal breath sounds. No wheezing.     Comments: Speaking in full sentences.  Clear lung sounds Abdominal:     General: There is no distension.     Palpations: Abdomen is soft.     Tenderness: There is no abdominal tenderness.     Comments: No TTP of the abdomen.  No CVA tenderness.  Musculoskeletal:        General: Normal range of motion.     Cervical back: Normal range of motion and neck supple.  Skin:    General: Skin is warm and dry.     Capillary Refill: Capillary refill takes less than 2 seconds.  Neurological:      Mental Status: She is alert and oriented to person, place, and time.  Psychiatric:        Mood and Affect: Mood and affect normal.     ED Results / Procedures / Treatments   Labs (all labs ordered are listed, but only abnormal results are displayed) Labs Reviewed  URINALYSIS, ROUTINE W REFLEX MICROSCOPIC - Abnormal; Notable for the following components:      Result Value   Leukocytes,Ua TRACE (*)    All other components within  normal limits  URINALYSIS, MICROSCOPIC (REFLEX) - Abnormal; Notable for the following components:   Bacteria, UA MANY (*)    All other components within normal limits  SARS CORONAVIRUS 2 (TAT 6-24 HRS)    EKG None  Radiology No results found.  Procedures Procedures   Medications Ordered in ED Medications  acetaminophen (TYLENOL) tablet 650 mg (650 mg Oral Given 05/15/20 1942)  ibuprofen (ADVIL) tablet 800 mg (800 mg Oral Given 05/15/20 2127)    ED Course  I have reviewed the triage vital signs and the nursing notes.  Pertinent labs & imaging results that were available during my care of the patient were reviewed by me and considered in my medical decision making (see chart for details).    MDM Rules/Calculators/A&P                          Patient presenting for evaluation of nasal congestion, ear pain, cough, sore throat.  On exam, patient appears nontoxic.  She does have signs of acute AOM, this is likely secondary to viral illness.  OP exam is not consistent with strep, Centor criteria low.  Favor viral illness, consider Covid.  Will send outpatient testing.  As patient received antibiotics for AOM, I do not believe strep testing would be beneficial.  Urine does show some signs of UTI, and as patient has symptoms, this is likely the case.  While Augmentin is not ideal for treatment of UTI, will start with this to prevent patient from needing to take multiple antibiotics at once. She can f/u with PCP as needed if sxs do not improve. Discussed  findings with patient.  Discussed importance of taking antibiotics.  Otherwise symptomatic management.  Flonase given for nasal congestion and to help with ear pain.  At this time, patient appears safe for discharge.  Return precautions given.  Patient states she understands and agrees to plan.  Final Clinical Impression(s) / ED Diagnoses Final diagnoses:  Acute suppurative otitis media of left ear without spontaneous rupture of tympanic membrane, recurrence not specified  Urinary tract infection without hematuria, site unspecified  Viral illness    Rx / DC Orders ED Discharge Orders         Ordered    amoxicillin-clavulanate (AUGMENTIN) 875-125 MG tablet  2 times daily        05/15/20 2134    fluticasone (FLONASE) 50 MCG/ACT nasal spray  Daily        05/15/20 2134           Franchot Heidelberg, PA-C 05/15/20 2204    Lucrezia Starch, MD 05/16/20 1236

## 2020-05-16 ENCOUNTER — Other Ambulatory Visit: Payer: Self-pay

## 2020-05-16 ENCOUNTER — Emergency Department (HOSPITAL_COMMUNITY)
Admission: EM | Admit: 2020-05-16 | Discharge: 2020-05-17 | Disposition: A | Discharge status: Home / Self Care | Payer: Medicare Other | Attending: Emergency Medicine | Admitting: Emergency Medicine

## 2020-05-16 ENCOUNTER — Encounter (HOSPITAL_COMMUNITY): Payer: Self-pay | Admitting: Emergency Medicine

## 2020-05-16 DIAGNOSIS — Z5321 Procedure and treatment not carried out due to patient leaving prior to being seen by health care provider: Secondary | ICD-10-CM | POA: Insufficient documentation

## 2020-05-16 DIAGNOSIS — H9203 Otalgia, bilateral: Secondary | ICD-10-CM | POA: Insufficient documentation

## 2020-05-16 LAB — SARS CORONAVIRUS 2 (TAT 6-24 HRS): SARS Coronavirus 2: NEGATIVE

## 2020-05-16 NOTE — ED Triage Notes (Signed)
Patient reports bilateral earache for several days , denies injury , no hearing loss, denies fever or chills , seen here yesterday for the same complaint.

## 2020-05-17 NOTE — ED Notes (Signed)
Pt seen getting into a vehicle and leaving

## 2020-05-17 NOTE — ED Notes (Signed)
Pt never returned

## 2020-05-18 ENCOUNTER — Other Ambulatory Visit: Payer: Self-pay

## 2020-05-18 ENCOUNTER — Encounter (HOSPITAL_COMMUNITY): Payer: Self-pay

## 2020-05-18 ENCOUNTER — Ambulatory Visit (HOSPITAL_COMMUNITY)
Admission: EM | Admit: 2020-05-18 | Discharge: 2020-05-18 | Disposition: A | Discharge status: Home / Self Care | Payer: Medicare Other | Attending: Family | Admitting: Family

## 2020-05-18 DIAGNOSIS — H9201 Otalgia, right ear: Secondary | ICD-10-CM

## 2020-05-18 DIAGNOSIS — R300 Dysuria: Secondary | ICD-10-CM | POA: Diagnosis not present

## 2020-05-18 DIAGNOSIS — R1114 Bilious vomiting: Secondary | ICD-10-CM | POA: Diagnosis not present

## 2020-05-18 DIAGNOSIS — H60393 Other infective otitis externa, bilateral: Secondary | ICD-10-CM | POA: Diagnosis not present

## 2020-05-18 DIAGNOSIS — H66002 Acute suppurative otitis media without spontaneous rupture of ear drum, left ear: Secondary | ICD-10-CM | POA: Diagnosis not present

## 2020-05-18 LAB — POCT URINALYSIS DIPSTICK, ED / UC
Bilirubin Urine: NEGATIVE
Glucose, UA: NEGATIVE mg/dL
Hgb urine dipstick: NEGATIVE
Ketones, ur: NEGATIVE mg/dL
Leukocytes,Ua: NEGATIVE
Nitrite: NEGATIVE
Protein, ur: NEGATIVE mg/dL
Specific Gravity, Urine: >=1.03 (ref 1.005–1.030)
Urobilinogen, UA: 0.2 mg/dL (ref 0.0–1.0)
pH: 5.5 (ref 5.0–8.0)

## 2020-05-18 MED ORDER — DOXYCYCLINE HYCLATE 100 MG PO CAPS
100.0000 mg | ORAL_CAPSULE | Freq: Two times a day (BID) | ORAL | 0 refills | Status: AC
Start: 2020-05-18 — End: 2020-05-25

## 2020-05-18 MED ORDER — CIPROFLOXACIN-DEXAMETHASONE 0.3-0.1 % OT SUSP
4.0000 [drp] | Freq: Two times a day (BID) | OTIC | 0 refills | Status: AC
Start: 2020-05-18 — End: 2020-05-25

## 2020-05-18 MED ORDER — PROMETHAZINE HCL 12.5 MG PO TABS
12.5000 mg | ORAL_TABLET | Freq: Three times a day (TID) | ORAL | 0 refills | Status: DC | PRN
Start: 2020-05-18 — End: 2020-05-25

## 2020-05-18 NOTE — ED Triage Notes (Signed)
Pt presents with bilateral ear pain X 4 days. She states she was prescribed Amoxicillin for an ear infection on her right ear. She states a day later her left ear began to bleed. She states the pain has radiated to her neck.

## 2020-05-18 NOTE — Discharge Instructions (Addendum)
Recommend stop Augmentin, switch to Doxycycline 100mg  twice a day for 7 days to help with ear infection as well as urinary symptoms. Use Ciprodex 4 drops in both ears twice a day for 7 days. May take Phenergan 12.5mg  every 8 hours as needed for nausea. May apply warm compresses to face and near ear for comfort. Continue to push fluids. Follow-up here in 3 to 4 days if not improving.

## 2020-05-18 NOTE — ED Provider Notes (Signed)
[facility]    CSN: [id number] Arrival date & time: 05/18/20  4742      History   Chief Complaint Chief Complaint  Patient presents with  . Otalgia  . Emesis    HPI Brittany Mullins is a 40 y.o. female.   40 year old female presents with bilateral ear pain and bleeding from her right ear. She started with more left ear pain, nasal congestion and cough 4 to 5 days ago. No distinct fever but has felt warm. She went to [facility] ER 3 days ago (05/15/20) and was diagnosed with bilateral inner ear infection and placed on Augmentin. She was also having some dysuria and urine was positive for some bacteria but otherwise remainder of UA was negative. No urine culture sent. She has taken the Augmentin and used Flonase but ear pain is getting worse and today blood was coming out of her right ear canal. Her congestion and cough have improved but continues to be nauseous and vomited yesterday. She has also taken Ibuprofen for pain with minimal relief. She had a COVID 19 test 3 days ago which was negative. She is unvaccinated for COVID 19. Other chronic health issues include PCOS, Addison's disease, benign brain tumor, HTN, marijuana use and opioid dependence. Currently on Lessina and Methadone daily.   The history is provided by the patient.    Past Medical History:  Diagnosis Date  . Addison's disease ([place])   . Brain tumor (benign) ([place])   . Hypertension   . Marijuana use    Per pt: "medical marijuana patient"  . Pancreatitis   . PCOS (polycystic ovarian syndrome)   . Pituitary tumor   . Seizures [facility])     Patient Active Problem List   Diagnosis Date Noted  . Intractable nausea and vomiting 10/25/2019  . History of Addison's disease   . Intractable cyclical vomiting with nausea   . Opioid dependence ([place]) 06/03/2017  . Generalized abdominal pain   . Hypomagnesemia   . Marijuana use     Past Surgical History:  Procedure Laterality Date  . ABDOMINAL HYSTERECTOMY     . APPENDECTOMY    . CESAREAN SECTION    . CHOLECYSTECTOMY    . ELBOW SURGERY    . kidney stent    . pancreatic stent      OB History   No obstetric history on file.      Home Medications    Prior to Admission medications   Medication Sig Start Date End Date Taking? Authorizing Provider  ciprofloxacin-dexamethasone (CIPRODEX) OTIC suspension Place 4 drops into both ears 2 (two) times daily for 7 days. 05/18/20 05/25/20 Yes Eulalio Reamy, Nicholes Stairs, NP  doxycycline (VIBRAMYCIN) 100 MG capsule Take 1 capsule (100 mg total) by mouth 2 (two) times daily for 7 days. 05/18/20 05/25/20 Yes Baptiste Littler, Nicholes Stairs, NP  fluticasone (FLONASE) 50 MCG/ACT nasal spray Place 1 spray into both nostrils daily. 05/15/20   Caccavale, Sophia, PA-C  LESSINA-28 0.1-20 MG-MCG tablet Take 1 tablet by mouth daily. 08/26/19   [provider]  methadone (DOLOPHINE) 10 MG/ML solution Take 110 mg by mouth daily.    [provider]  promethazine (PHENERGAN) 12.5 MG tablet Take 1 tablet (12.5 mg total) by mouth every 8 (eight) hours as needed for nausea or vomiting. 05/18/20   Katy Apo, NP  pantoprazole (PROTONIX) 40 MG tablet Take 1 tablet (40 mg total) by mouth 2 (two) times daily before a meal. 06/05/17 12/24/18  Isaac Bliss,  Rayford Halsted, MD    Family History Family History  Problem Relation Age of Onset  . Cancer Mother   . Hypertension Mother   . Diabetes Maternal Grandmother   . Diabetes Maternal Grandfather     Social History Social History   Tobacco Use  . Smoking status: Never Smoker  . Smokeless tobacco: Never Used  Vaping Use  . Vaping Use: Never used  Substance Use Topics  . Alcohol use: No  . Drug use: Yes    Types: Marijuana     Allergies   Bee venom, Toradol [ketorolac tromethamine], and Benadryl [diphenhydramine]   Review of Systems Review of Systems  Constitutional: Positive for appetite change, chills and fatigue. Negative for fever.  HENT: Positive for congestion  (improving), ear discharge (right- with blood), ear pain (bilateral) and hearing loss. Negative for facial swelling, nosebleeds, postnasal drip, sinus pressure, sinus pain, sore throat and trouble swallowing.   Eyes: Negative for pain, discharge, redness and itching.  Respiratory: Positive for cough (improving). Negative for chest tightness, shortness of breath and wheezing.   Gastrointestinal: Positive for nausea and vomiting (last vomited yesterday).  Genitourinary: Positive for dysuria, frequency and urgency. Negative for flank pain, hematuria and vaginal discharge.  Musculoskeletal: Positive for neck pain. Negative for neck stiffness.  Skin: Negative for color change and rash.  Allergic/Immunologic: Positive for environmental allergies. Negative for food allergies and immunocompromised state.  Neurological: Positive for light-headedness and headaches. Negative for syncope, speech difficulty, weakness and numbness.  Hematological: Positive for adenopathy. Does not bruise/bleed easily.     Physical Exam Triage Vital Signs ED Triage Vitals  Enc Vitals Group     BP 05/18/20 0942 (!) 147/95     Pulse Rate 05/18/20 0942 83     Resp 05/18/20 0942 18     Temp 05/18/20 0942 98.7 F (37.1 C)     Temp Source 05/18/20 0942 Oral     SpO2 05/18/20 0942 98 %     Weight --      Height --      Head Circumference --      Peak Flow --      Pain Score 05/18/20 0941 8     Pain Loc --      Pain Edu? --      Excl. in [place]? --    No data found.  Updated Vital Signs BP (!) 147/95 (BP Location: Left Arm)   Pulse 83   Temp 98.7 F (37.1 C) (Oral)   Resp 18   SpO2 98%   Visual Acuity Right Eye Distance:   Left Eye Distance:   Bilateral Distance:    Right Eye Near:   Left Eye Near:    Bilateral Near:     Physical Exam Vitals and nursing note reviewed.  Constitutional:      General: She is awake. She is not in acute distress.    Appearance: She is well-developed. She is ill-appearing.      Comments: She is lying down on the exam table in no acute distress but appears tired and in pain.   HENT:     Head: Normocephalic and atraumatic.     Right Ear: Tympanic membrane and external ear normal. Decreased hearing noted. Drainage, swelling and tenderness present. There is no impacted cerumen. No foreign body. Tympanic membrane is not injected, perforated, erythematous, retracted or bulging.     Left Ear: External ear normal. Decreased hearing noted. Drainage and swelling present. There is no impacted cerumen.  No foreign body. Tympanic membrane is injected, erythematous and bulging. Tympanic membrane is not perforated.     Ears:     Comments: Left TM red and bulging along with yellowish drainage from ear canal. No distinct perforation. Right TM normal but canal red, swollen with yellowish drainage and anterior edge of canal with red blood. Very tender and swollen.     Nose: Nose normal. No congestion.     Right Sinus: No maxillary sinus tenderness or frontal sinus tenderness.     Left Sinus: No maxillary sinus tenderness or frontal sinus tenderness.     Mouth/Throat:     Lips: Pink.     Mouth: Mucous membranes are moist.     Pharynx: Oropharynx is clear. Uvula midline. No pharyngeal swelling, oropharyngeal exudate, posterior oropharyngeal erythema or uvula swelling.  Eyes:     Extraocular Movements: Extraocular movements intact.     Conjunctiva/sclera: Conjunctivae normal.  Neck:     Comments: No lymph node swelling but tender along eustachian tubes bilaterally in neck.  Cardiovascular:     Rate and Rhythm: Normal rate and regular rhythm.     Heart sounds: Normal heart sounds. No murmur heard.   Pulmonary:     Effort: Pulmonary effort is normal. No respiratory distress.     Breath sounds: Normal breath sounds and air entry. No decreased air movement. No decreased breath sounds, wheezing or rhonchi.  Abdominal:     General: Abdomen is flat. There is no distension.     Palpations:  Abdomen is soft.     Tenderness: There is abdominal tenderness in the suprapubic area. There is right CVA tenderness and left CVA tenderness.  Musculoskeletal:     Cervical back: Normal range of motion.  Lymphadenopathy:     Cervical: No cervical adenopathy.     Comments: No distinct lymphadenopathy But lymph nodes tender.   Skin:    General: Skin is warm and dry.     Capillary Refill: Capillary refill takes less than 2 seconds.     Findings: No rash.  Neurological:     General: No focal deficit present.     Mental Status: She is alert and oriented to person, place, and time.  Psychiatric:        Attention and Perception: Attention normal.        Mood and Affect: Mood normal.        Speech: Speech normal.        Behavior: Behavior is slowed. Behavior is cooperative.        Thought Content: Thought content normal.      UC Treatments / Results  Labs (all labs ordered are listed, but only abnormal results are displayed) Labs Reviewed  POCT URINALYSIS DIPSTICK, ED / UC    EKG   Radiology No results found.  Procedures Procedures (including critical care time)  Medications Ordered in UC Medications - No data to display  Initial Impression / Assessment and Plan / UC Course  I have reviewed the triage vital signs and the nursing notes.  Pertinent labs & imaging results that were available during my care of the patient were reviewed by me and considered in my medical decision making (see chart for details).    Reviewed urinalysis results with patient- all negative. Did not send the urine for culture. Discussed that since ear pain has worsened and inner ear infection has not improved, will stop Augmentin and switch to Doxycycline which may cover any urinary pathogens present. Recommend take Doxycycline  100mg  twice a day for 7 days. Due to swelling and bloody drainage from ear canal infection, recommend Ciprodex ear drops- 4 drops in both ears twice a day as directed for 7 days.  Patient indicates Zofran has not been as helpful for nausea and Phenergan seems to work better. Will Rx Phenergan 12.5mg  every 8 hours as needed for nausea. May continue OTC Ibuprofen as directed for pain. Also may apply warm compresses to face near ear for comfort. Continue to push fluids to stay well hydrated. May need to see an ENT if symptoms persist. Also may need to have further evaluation of urinary symptoms if they persist. Follow-up here in 3 to 4 days if not improving.  Final Clinical Impressions(s) / UC Diagnoses   Final diagnoses:  Acute pain of right ear  Infective otitis externa of both ears  Left acute suppurative otitis media  Dysuria  Bilious vomiting with nausea     Discharge Instructions     Recommend stop Augmentin, switch to Doxycycline 100mg  twice a day for 7 days to help with ear infection as well as urinary symptoms. Use Ciprodex 4 drops in both ears twice a day for 7 days. May take Phenergan 12.5mg  every 8 hours as needed for nausea. May apply warm compresses to face and near ear for comfort. Continue to push fluids. Follow-up here in 3 to 4 days if not improving.     ED Prescriptions    Medication Sig Dispense Auth. Provider   promethazine (PHENERGAN) 12.5 MG tablet Take 1 tablet (12.5 mg total) by mouth every 8 (eight) hours as needed for nausea or vomiting. 20 tablet Buffie Herne, Nicholes Stairs, NP   ciprofloxacin-dexamethasone (CIPRODEX) OTIC suspension Place 4 drops into both ears 2 (two) times daily for 7 days. 7.5 mL Katy Apo, NP   doxycycline (VIBRAMYCIN) 100 MG capsule Take 1 capsule (100 mg total) by mouth 2 (two) times daily for 7 days. 14 capsule Vishaal Strollo, Nicholes Stairs, NP     PDMP not reviewed this encounter.   Katy Apo, NP 05/18/20 1701

## 2020-05-18 NOTE — ED Triage Notes (Signed)
Pt states she was vomiting yesterday.

## 2020-05-18 NOTE — ED Notes (Signed)
Called pt 3x in lobby. No Answer.

## 2020-05-25 ENCOUNTER — Ambulatory Visit (HOSPITAL_COMMUNITY)
Admission: EM | Admit: 2020-05-25 | Discharge: 2020-05-25 | Disposition: A | Discharge status: Home / Self Care | Payer: Medicare Other | Attending: Urgent Care | Admitting: Urgent Care

## 2020-05-25 ENCOUNTER — Other Ambulatory Visit: Payer: Self-pay

## 2020-05-25 ENCOUNTER — Encounter (HOSPITAL_COMMUNITY): Payer: Self-pay | Admitting: *Deleted

## 2020-05-25 DIAGNOSIS — H9201 Otalgia, right ear: Secondary | ICD-10-CM | POA: Diagnosis not present

## 2020-05-25 DIAGNOSIS — H9202 Otalgia, left ear: Secondary | ICD-10-CM

## 2020-05-25 MED ORDER — PROMETHAZINE HCL 12.5 MG PO TABS
12.5000 mg | ORAL_TABLET | Freq: Three times a day (TID) | ORAL | 0 refills | Status: DC | PRN
Start: 2020-05-25 — End: 2021-08-21

## 2020-05-25 NOTE — ED Triage Notes (Signed)
Pt reports Ear pain LT/RT  Are not any better since last visit on 05-18-20. Pt reports pain to bil ears and decreased hearing.

## 2020-05-25 NOTE — ED Provider Notes (Signed)
[place]   MRN: [id number] DOB: [birth year]  Subjective:   Brittany Mullins is a 40 y.o. female presenting for recheck on right ear infection.  Patient has had multiple visits for the same on 02/13, 02/16.  She was initially prescribed Augmentin and then subsequently switched to doxycycline and Ciprodex otic suspension. Today, she reports ongoing pain in both ears despite finishing her antibiotics. Right ear is worse than left. Has decreased hearing. Has also had persistent nausea and vomiting. No diarrhea, fever, ear drainage, dizziness.   No current facility-administered medications for this encounter.  Current Outpatient Medications:  .  ciprofloxacin-dexamethasone (CIPRODEX) OTIC suspension, Place 4 drops into both ears 2 (two) times daily for 7 days., Disp: 7.5 mL, Rfl: 0 .  doxycycline (VIBRAMYCIN) 100 MG capsule, Take 1 capsule (100 mg total) by mouth 2 (two) times daily for 7 days., Disp: 14 capsule, Rfl: 0 .  fluticasone (FLONASE) 50 MCG/ACT nasal spray, Place 1 spray into both nostrils daily., Disp: 16 g, Rfl: 0 .  LESSINA-28 0.1-20 MG-MCG tablet, Take 1 tablet by mouth daily., Disp: , Rfl:  .  methadone (DOLOPHINE) 10 MG/ML solution, Take 110 mg by mouth daily., Disp: , Rfl:  .  promethazine (PHENERGAN) 12.5 MG tablet, Take 1 tablet (12.5 mg total) by mouth every 8 (eight) hours as needed for nausea or vomiting., Disp: 20 tablet, Rfl: 0   Allergies  Allergen Reactions  . Bee Venom Anaphylaxis  . Toradol [Ketorolac Tromethamine] Shortness Of Breath  . Benadryl [Diphenhydramine] Other (See Comments)    States she cannot breathe    Past Medical History:  Diagnosis Date  . Addison's disease ([place])   . Brain tumor (benign) ([place])   . Hypertension   . Marijuana use    Per pt: "medical marijuana patient"  . Pancreatitis   . PCOS (polycystic ovarian syndrome)   . Pituitary tumor   . Seizures ([place])      Past Surgical History:  Procedure Laterality Date   . ABDOMINAL HYSTERECTOMY    . APPENDECTOMY    . CESAREAN SECTION    . CHOLECYSTECTOMY    . ELBOW SURGERY    . kidney stent    . pancreatic stent      Family History  Problem Relation Age of Onset  . Cancer Mother   . Hypertension Mother   . Diabetes Maternal Grandmother   . Diabetes Maternal Grandfather     Social History   Tobacco Use  . Smoking status: Never Smoker  . Smokeless tobacco: Never Used  Vaping Use  . Vaping Use: Never used  Substance Use Topics  . Alcohol use: No  . Drug use: Yes    Types: Marijuana    ROS   Objective:   Vitals: BP 140/83 (BP Location: Right Arm)   Pulse 94   Temp 99 F (37.2 C) (Oral)   Resp 20   SpO2 96%   Physical Exam Constitutional:      General: She is not in acute distress.    Appearance: Normal appearance. She is well-developed. She is not ill-appearing, toxic-appearing or diaphoretic.  HENT:     Head: Normocephalic and atraumatic.     Right Ear: Ear canal and external ear normal. No drainage or tenderness. No middle ear effusion. There is no impacted cerumen. Tympanic membrane is not erythematous.     Left Ear: Ear canal and external ear normal. No drainage or tenderness.  No middle ear effusion. There is no impacted  cerumen. Tympanic membrane is not erythematous.     Ears:     Comments: TMs intact bilaterally but opacified and slightly erythematous over perimeter.     Nose: Nose normal. No congestion or rhinorrhea.     Mouth/Throat:     Mouth: Mucous membranes are moist. No oral lesions.     Pharynx: Oropharynx is clear. No pharyngeal swelling, oropharyngeal exudate, posterior oropharyngeal erythema or uvula swelling.     Tonsils: No tonsillar exudate or tonsillar abscesses.  Eyes:     General: No scleral icterus.    Extraocular Movements: Extraocular movements intact.     Right eye: Normal extraocular motion.     Left eye: Normal extraocular motion.     Conjunctiva/sclera: Conjunctivae normal.     Pupils:  Pupils are equal, round, and reactive to light.  Cardiovascular:     Rate and Rhythm: Normal rate.  Pulmonary:     Effort: Pulmonary effort is normal.  Musculoskeletal:     Cervical back: Normal range of motion and neck supple.  Lymphadenopathy:     Cervical: No cervical adenopathy.  Skin:    General: Skin is warm and dry.  Neurological:     General: No focal deficit present.     Mental Status: She is alert and oriented to person, place, and time.  Psychiatric:        Mood and Affect: Mood normal.        Behavior: Behavior normal.       Assessment and Plan :   PDMP not reviewed this encounter.  1. Otalgia of right ear   2. Otalgia of left ear     Refilled her phenergan. Emphasized need for follow up with Dr. Benjamine Mola as she has failed multiple antibiotic courses. Information provided to her for this. Will hold off on further treatments until she can be evaluated further by Dr. Benjamine Mola. Counseled patient on potential for adverse effects with medications prescribed/recommended today, ER and return-to-clinic precautions discussed, patient verbalized understanding.    Jaynee Eagles, [place] 05/25/20 [phone]

## 2020-05-25 NOTE — Discharge Instructions (Signed)
Please call the Ear Nose Throat specialist, Dr. Benjamine Mola. His practice is on call for [facility] and can help you further as you have not responded to Augmentin, doxycycline and cipro-dex.

## 2020-06-08 DIAGNOSIS — H903 Sensorineural hearing loss, bilateral: Secondary | ICD-10-CM | POA: Insufficient documentation

## 2020-10-19 ENCOUNTER — Other Ambulatory Visit: Payer: Self-pay

## 2020-10-19 ENCOUNTER — Emergency Department (HOSPITAL_COMMUNITY)
Admission: EM | Admit: 2020-10-19 | Discharge: 2020-10-19 | Disposition: A | Discharge status: Home / Self Care | Payer: Medicare Other | Attending: Emergency Medicine | Admitting: Emergency Medicine

## 2020-10-19 ENCOUNTER — Emergency Department (HOSPITAL_COMMUNITY): Payer: Medicare Other

## 2020-10-19 ENCOUNTER — Encounter (HOSPITAL_COMMUNITY): Payer: Self-pay

## 2020-10-19 DIAGNOSIS — R079 Chest pain, unspecified: Secondary | ICD-10-CM | POA: Insufficient documentation

## 2020-10-19 DIAGNOSIS — R064 Hyperventilation: Secondary | ICD-10-CM | POA: Diagnosis not present

## 2020-10-19 DIAGNOSIS — M79602 Pain in left arm: Secondary | ICD-10-CM | POA: Insufficient documentation

## 2020-10-19 DIAGNOSIS — Z5321 Procedure and treatment not carried out due to patient leaving prior to being seen by health care provider: Secondary | ICD-10-CM | POA: Diagnosis not present

## 2020-10-19 DIAGNOSIS — R404 Transient alteration of awareness: Secondary | ICD-10-CM | POA: Insufficient documentation

## 2020-10-19 LAB — COMPREHENSIVE METABOLIC PANEL
ALT: 19 U/L (ref 0–44)
AST: 31 U/L (ref 15–41)
Albumin: 3.9 g/dL (ref 3.5–5.0)
Alkaline Phosphatase: 43 U/L (ref 38–126)
Anion gap: 10 (ref 5–15)
BUN: 13 mg/dL (ref 6–20)
CO2: 23 mmol/L (ref 22–32)
Calcium: 9.1 mg/dL (ref 8.9–10.3)
Chloride: 101 mmol/L (ref 98–111)
Creatinine, Ser: 0.95 mg/dL (ref 0.44–1.00)
GFR, Estimated: >60 mL/min (ref >60)
Glucose, Bld: 151 mg/dL — ABNORMAL HIGH (ref 70–99)
Potassium: 3.8 mmol/L (ref 3.5–5.1)
Sodium: 134 mmol/L — ABNORMAL LOW (ref 135–145)
Total Bilirubin: 0.5 mg/dL (ref 0.3–1.2)
Total Protein: 6.7 g/dL (ref 6.5–8.1)

## 2020-10-19 LAB — CBC WITH DIFFERENTIAL/PLATELET
Abs Immature Granulocytes: 0.02 10*3/uL (ref 0.00–0.07)
Basophils Absolute: 0 10*3/uL (ref 0.0–0.1)
Basophils Relative: 1 %
Eosinophils Absolute: 0.1 10*3/uL (ref 0.0–0.5)
Eosinophils Relative: 1 %
HCT: 40.2 % (ref 36.0–46.0)
Hemoglobin: 13.3 g/dL (ref 12.0–15.0)
Immature Granulocytes: 0 %
Lymphocytes Relative: 22 %
Lymphs Abs: 1.6 10*3/uL (ref 0.7–4.0)
MCH: 30.7 pg (ref 26.0–34.0)
MCHC: 33.1 g/dL (ref 30.0–36.0)
MCV: 92.8 fL (ref 80.0–100.0)
Monocytes Absolute: 0.4 10*3/uL (ref 0.1–1.0)
Monocytes Relative: 6 %
Neutro Abs: 5.3 10*3/uL (ref 1.7–7.7)
Neutrophils Relative %: 70 %
Platelets: 235 10*3/uL (ref 150–400)
RBC: 4.33 MIL/uL (ref 3.87–5.11)
RDW: 12 % (ref 11.5–15.5)
WBC: 7.5 10*3/uL (ref 4.0–10.5)
nRBC: 0 % (ref 0.0–0.2)

## 2020-10-19 LAB — URINALYSIS, ROUTINE W REFLEX MICROSCOPIC
Bilirubin Urine: NEGATIVE
Glucose, UA: NEGATIVE mg/dL
Hgb urine dipstick: NEGATIVE
Ketones, ur: NEGATIVE mg/dL
Leukocytes,Ua: NEGATIVE
Nitrite: NEGATIVE
Protein, ur: NEGATIVE mg/dL
Specific Gravity, Urine: 1.021 (ref 1.005–1.030)
pH: 5 (ref 5.0–8.0)

## 2020-10-19 LAB — ETHANOL: Alcohol, Ethyl (B): <10 mg/dL (ref <10)

## 2020-10-19 LAB — RAPID URINE DRUG SCREEN, HOSP PERFORMED
Amphetamines: NOT DETECTED
Barbiturates: NOT DETECTED
Benzodiazepines: NOT DETECTED
Cocaine: NOT DETECTED
Opiates: NOT DETECTED
Tetrahydrocannabinol: POSITIVE — AB

## 2020-10-19 LAB — I-STAT BETA HCG BLOOD, ED (MC, WL, AP ONLY): I-stat hCG, quantitative: <5 m[IU]/mL (ref <5)

## 2020-10-19 LAB — LIPASE, BLOOD: Lipase: 30 U/L (ref 11–51)

## 2020-10-19 LAB — TROPONIN I (HIGH SENSITIVITY): Troponin I (High Sensitivity): 7 ng/L (ref <18)

## 2020-10-19 NOTE — ED Triage Notes (Signed)
Per lobby staff - pt had to be pulled out of the vehicle as pt was unresponsive with shallow breathing.  In triage c/o left arm pain and chest pain pt more responsive at this time. Pt appears hyperventilating. Family at bedside.

## 2020-10-19 NOTE — ED Notes (Addendum)
Attempted to repeat EKG, EKG machine not picking up adequately causing a lot of artifact. RN aware.

## 2020-10-19 NOTE — ED Notes (Signed)
ED Provider at bedside. 

## 2020-10-19 NOTE — ED Provider Notes (Signed)
Emergency Medicine Provider Triage Evaluation Note  Maleena Eddleman , a 40 y.o. female  was evaluated in triage.  Pt complains of chest pain and shortness of breath beginning during dinner.  [employer] reports patient was told from her vehicle in front of the ED.  She was unresponsive.  They sternal rub her and she began hyperventilating.  Denies drug use, new meds, trauma.  Accompanied by significant other.  Reports no recent illness or new medications.  Review of Systems  Positive: Chest pain, shortness of breath Negative: Vomiting, diarrhea, abdominal pain, syncope  Physical Exam  BP (!) 183/120 (BP Location: Left Arm)   Pulse (!) 113   Temp 99.4 F (37.4 C) (Oral)   Resp (!) 28   SpO2 100%  Gen:   Awake, hyperventilating Resp:  Normal effort  MSK:   Moves extremities without difficulty  Other:  Carpal spasm  Medical Decision Making  Medically screening exam initiated at 9:12 PM.  Appropriate orders placed.  Temika Sutphin was informed that the remainder of the evaluation will be completed by another provider, this initial triage assessment does not replace that evaluation, and the importance of remaining in the ED until their evaluation is complete.  Patient hyperventilating.  She was calmed and symptoms improved.   Lorayne Bender, PA-C 10/19/20 2140    Drenda Freeze, MD 10/19/20 [phone]

## 2020-10-19 NOTE — ED Notes (Signed)
Pt support person states he is taking her to [facility].

## 2020-10-20 LAB — TROPONIN I (HIGH SENSITIVITY): Troponin I (High Sensitivity): 8 ng/L (ref <18)

## 2020-11-06 ENCOUNTER — Encounter (HOSPITAL_COMMUNITY): Payer: Self-pay

## 2020-11-06 ENCOUNTER — Other Ambulatory Visit: Payer: Self-pay

## 2020-11-06 ENCOUNTER — Ambulatory Visit (HOSPITAL_COMMUNITY)
Admission: EM | Admit: 2020-11-06 | Discharge: 2020-11-06 | Disposition: A | Discharge status: Home / Self Care | Payer: Medicare Other | Attending: Medical Oncology | Admitting: Medical Oncology

## 2020-11-06 DIAGNOSIS — B029 Zoster without complications: Secondary | ICD-10-CM | POA: Diagnosis not present

## 2020-11-06 MED ORDER — VALACYCLOVIR HCL 1 G PO TABS: 1000.0000 mg | ORAL_TABLET | Freq: Three times a day (TID) | ORAL | 0 refills | Status: AC

## 2020-11-06 MED ORDER — PREDNISONE 10 MG PO TABS: 60.0000 mg | ORAL_TABLET | Freq: Every day | ORAL | 0 refills | Status: AC

## 2020-11-06 NOTE — ED Triage Notes (Signed)
Pt presents with rash on bridge of nose by eyes, generalized body aches, and fatigue since yesterday.

## 2020-11-06 NOTE — ED Provider Notes (Addendum)
[place]    CSN: [id number] Arrival date & time: 11/06/20  1215      History   Chief Complaint Chief Complaint  Patient presents with   Rash   Generalized Body Aches   Fatigue    HPI Brittany Mullins is a 40 y.o. female.   HPI  Rash: Patient states that she has had a rash on the bridge of her nose and eyes along with general body aches and fatigue since yesterday. Rash is painful. No known bites, wounds or potential infection. She has tried motrin which has helped some. She denies neck stiffness, visual changes, eye pain, ear pain.   Past Medical History:  Diagnosis Date   Addison's disease ([place])    Brain tumor (benign) ([place])    Hypertension    Marijuana use    Per pt: "medical marijuana patient"   Pancreatitis    PCOS (polycystic ovarian syndrome)    Pituitary tumor    Seizures ([place])     Patient Active Problem List   Diagnosis Date Noted   Intractable nausea and vomiting 10/25/2019   History of Addison's disease    Intractable cyclical vomiting with nausea    Opioid dependence ([place]) 06/03/2017   Generalized abdominal pain    Hypomagnesemia    Marijuana use     Past Surgical History:  Procedure Laterality Date   ABDOMINAL HYSTERECTOMY     APPENDECTOMY     CESAREAN SECTION     CHOLECYSTECTOMY     ELBOW SURGERY     kidney stent     pancreatic stent      OB History   No obstetric history on file.      Home Medications    Prior to Admission medications   Medication Sig Start Date End Date Taking? Authorizing Provider  fluticasone (FLONASE) 50 MCG/ACT nasal spray Place 1 spray into both nostrils daily. 05/15/20   Caccavale, Sophia, PA-C  LESSINA-28 0.1-20 MG-MCG tablet Take 1 tablet by mouth daily. 08/26/19   [provider]  methadone (DOLOPHINE) 10 MG/ML solution Take 110 mg by mouth daily.    [provider]  promethazine (PHENERGAN) 12.5 MG tablet Take 1 tablet (12.5 mg total) by mouth every 8 (eight) hours as  needed for nausea or vomiting. 05/25/20   Jaynee Eagles, PA-C  pantoprazole (PROTONIX) 40 MG tablet Take 1 tablet (40 mg total) by mouth 2 (two) times daily before a meal. 06/05/17 12/24/18  Isaac Bliss, Rayford Halsted, MD    Family History Family History  Problem Relation Age of Onset   Cancer Mother    Hypertension Mother    Diabetes Maternal Grandmother    Diabetes Maternal Grandfather     Social History Social History   Tobacco Use   Smoking status: Never   Smokeless tobacco: Never  Vaping Use   Vaping Use: Never used  Substance Use Topics   Alcohol use: No   Drug use: Yes    Types: Marijuana     Allergies   Bee venom, Toradol [ketorolac tromethamine], and Benadryl [diphenhydramine]   Review of Systems Review of Systems  As stated above in HPI Physical Exam Triage Vital Signs ED Triage Vitals [11/06/20 1331]  Enc Vitals Group     BP (!) 169/97     Pulse Rate 71     Resp 18     Temp 98.9 F (37.2 C)     Temp Source Oral     SpO2 98 %  Weight      Height      Head Circumference      Peak Flow      Pain Score 6     Pain Loc      Pain Edu?      Excl. in [place]?    No data found.  Updated Vital Signs BP (!) 169/97 (BP Location: Left Arm)   Pulse 71   Temp 98.9 F (37.2 C) (Oral)   Resp 18   SpO2 98%   Physical Exam Vitals and nursing note reviewed.  Constitutional:      General: She is not in acute distress.    Appearance: Normal appearance. She is not ill-appearing, toxic-appearing or diaphoretic.  HENT:     Head: Normocephalic and atraumatic.     Right Ear: Tympanic membrane normal.     Left Ear: Tympanic membrane normal.     Mouth/Throat:     Mouth: Mucous membranes are moist.  Eyes:     Extraocular Movements: Extraocular movements intact.     Pupils: Pupils are equal, round, and reactive to light.  Cardiovascular:     Rate and Rhythm: Normal rate and regular rhythm.     Heart sounds: Normal heart sounds.  Pulmonary:     Effort: Pulmonary  effort is normal.     Breath sounds: Normal breath sounds.  Musculoskeletal:     Cervical back: Normal range of motion and neck supple.  Lymphadenopathy:     Cervical: No cervical adenopathy.  Skin:    Comments: See below  Neurological:     Mental Status: She is alert.      UC Treatments / Results  Labs (all labs ordered are listed, but only abnormal results are displayed) Labs Reviewed - No data to display  EKG   Radiology No results found.  Procedures Procedures (including critical care time)  Medications Ordered in UC Medications - No data to display  Initial Impression / Assessment and Plan / UC Course  I have reviewed the triage vital signs and the nursing notes.  Pertinent labs & imaging results that were available during my care of the patient were reviewed by me and considered in my medical decision making (see chart for details).     New.  This appears to be shingles.  As this is on her face but is not affecting her eye I am going to send in high-dose prednisone and start her on Valtrex to prevent further systemic injury and complication.  We discussed how to use these medications along with common potential side effects and precautions.  I stressed the importance of her seeing an eye specialist tomorrow for an eye exam.  Discussed red flag signs and symptoms.  We also discussed shingles in detail including how this occurs, likelihood of transmission to others through chickenpox, and typical length of illness. Final Clinical Impressions(s) / UC Diagnoses   Final diagnoses:  None   Discharge Instructions   None    ED Prescriptions   None    PDMP not reviewed this encounter.   Hughie Closs, PA-C 11/06/20 1422    Hughie Closs, PA-C 11/06/20 1423

## 2020-11-06 NOTE — Discharge Instructions (Addendum)
Please see an eye specialist tomorrow for an eye exam

## 2020-11-08 ENCOUNTER — Other Ambulatory Visit: Payer: Self-pay

## 2020-11-08 ENCOUNTER — Encounter (HOSPITAL_COMMUNITY): Payer: Self-pay | Admitting: Pharmacy Technician

## 2020-11-08 ENCOUNTER — Emergency Department (HOSPITAL_COMMUNITY)
Admission: EM | Admit: 2020-11-08 | Discharge: 2020-11-08 | Disposition: A | Discharge status: Home / Self Care | Payer: Medicare Other | Attending: Emergency Medicine | Admitting: Emergency Medicine

## 2020-11-08 ENCOUNTER — Encounter (HOSPITAL_COMMUNITY): Payer: Self-pay | Admitting: Emergency Medicine

## 2020-11-08 ENCOUNTER — Ambulatory Visit (HOSPITAL_COMMUNITY)
Admission: EM | Admit: 2020-11-08 | Discharge: 2020-11-08 | Payer: Medicare Other | Attending: Physician Assistant | Admitting: Physician Assistant

## 2020-11-08 DIAGNOSIS — R42 Dizziness and giddiness: Secondary | ICD-10-CM

## 2020-11-08 DIAGNOSIS — Z8639 Personal history of other endocrine, nutritional and metabolic disease: Secondary | ICD-10-CM

## 2020-11-08 DIAGNOSIS — R0602 Shortness of breath: Secondary | ICD-10-CM | POA: Diagnosis not present

## 2020-11-08 DIAGNOSIS — R1110 Vomiting, unspecified: Secondary | ICD-10-CM | POA: Insufficient documentation

## 2020-11-08 DIAGNOSIS — R112 Nausea with vomiting, unspecified: Secondary | ICD-10-CM

## 2020-11-08 DIAGNOSIS — Z5321 Procedure and treatment not carried out due to patient leaving prior to being seen by health care provider: Secondary | ICD-10-CM | POA: Diagnosis not present

## 2020-11-08 DIAGNOSIS — R079 Chest pain, unspecified: Secondary | ICD-10-CM | POA: Insufficient documentation

## 2020-11-08 DIAGNOSIS — R030 Elevated blood-pressure reading, without diagnosis of hypertension: Secondary | ICD-10-CM

## 2020-11-08 LAB — CBG MONITORING, ED: Glucose-Capillary: 124 mg/dL — ABNORMAL HIGH (ref 70–99)

## 2020-11-08 NOTE — Discharge Instructions (Addendum)
Have your son take you directly to the emergency room for further evaluation and management as we discussed.

## 2020-11-08 NOTE — ED Triage Notes (Signed)
Pt here with reports of chest pain, dizziness and emesis after being dx with shingles and started on valtrex 2 days ago. Pt went to UC and sent here for further evaluation.

## 2020-11-08 NOTE — ED Notes (Signed)
Patient is being discharged from the Urgent Care and sent to the Emergency Department via personal vehicle . Per Provider Verna Czech, patient is in need of higher level of care due to needing a cardiac workup. Patient is aware and verbalizes understanding of plan of care.  Vitals:   11/08/20 1741  BP: (!) 171/103  Pulse: 93  Resp: 18  SpO2: 99%

## 2020-11-08 NOTE — ED Provider Notes (Signed)
[place]    CSN: [id number] Arrival date & time: 11/08/20  1700      History   Chief Complaint Chief Complaint  Patient presents with   Dizziness   Emesis   Chest Pain    HPI Brittany Mullins is a 40 y.o. female.   Patient presents today with a 2-day history of.  She reports this is gradually been worsening and is currently rated 7 on a 0-10 pain scale, localized to anterior chest wall, described as pressure/something sitting on her chest, worse with activity, no alleviating factors identified.  She has not tried any over-the-counter medication for symptom management.  She reports associated shortness of breath, dizziness, lightheadedness.  She also reports multiple episodes of nausea and vomiting.  She has had difficulty keeping anything down for the last 2 days.  She denies any cough, congestion, sneezing, diarrhea, abdominal pain.  She does believe that she had a mild fever but took some over-the-counter analgesics and this resolved.  She was noted to have elevated blood pressure today but denies history of hypertension.  She denies family history of cardiovascular disease.  She denies history of hypertension, diabetes, smoking.  Denies any drug use.  She was seen 11/06/2020 at which point she had a rash noted on her face and was started on medication to manage shingles including prednisone and Valtrex.  She is unsure if current symptoms are related to these medications or something else.  She does have a history of Addison's disease but is not currently prescribed hydrocortisone or other steroids; does not have a sick dosing of these medicines.   Past Medical History:  Diagnosis Date   Addison's disease ([place])    Brain tumor (benign) ([place])    Hypertension    Marijuana use    Per pt: "medical marijuana patient"   Pancreatitis    PCOS (polycystic ovarian syndrome)    Pituitary tumor    Seizures ([place])     Patient Active Problem List   Diagnosis Date Noted    Intractable nausea and vomiting 10/25/2019   History of Addison's disease    Intractable cyclical vomiting with nausea    Opioid dependence ([place]) 06/03/2017   Generalized abdominal pain    Hypomagnesemia    Marijuana use     Past Surgical History:  Procedure Laterality Date   ABDOMINAL HYSTERECTOMY     APPENDECTOMY     CESAREAN SECTION     CHOLECYSTECTOMY     ELBOW SURGERY     kidney stent     pancreatic stent      OB History   No obstetric history on file.      Home Medications    Prior to Admission medications   Medication Sig Start Date End Date Taking? Authorizing Provider  fluticasone (FLONASE) 50 MCG/ACT nasal spray Place 1 spray into both nostrils daily. 05/15/20   Caccavale, Sophia, PA-C  LESSINA-28 0.1-20 MG-MCG tablet Take 1 tablet by mouth daily. 08/26/19   [provider]  methadone (DOLOPHINE) 10 MG/ML solution Take 110 mg by mouth daily.    [provider]  predniSONE (DELTASONE) 10 MG tablet Take 6 tablets (60 mg total) by mouth daily for 7 days. 11/06/20 11/13/20  Hughie Closs, PA-C  promethazine (PHENERGAN) 12.5 MG tablet Take 1 tablet (12.5 mg total) by mouth every 8 (eight) hours as needed for nausea or vomiting. 05/25/20   Jaynee Eagles, PA-C  valACYclovir (VALTREX) 1000 MG tablet Take 1 tablet (1,000 mg total) by  mouth 3 (three) times daily for 7 days. 11/06/20 11/13/20  Hughie Closs, PA-C  pantoprazole (PROTONIX) 40 MG tablet Take 1 tablet (40 mg total) by mouth 2 (two) times daily before a meal. 06/05/17 12/24/18  Isaac Bliss, Rayford Halsted, MD    Family History Family History  Problem Relation Age of Onset   Cancer Mother    Hypertension Mother    Diabetes Maternal Grandmother    Diabetes Maternal Grandfather     Social History Social History   Tobacco Use   Smoking status: Never   Smokeless tobacco: Never  Vaping Use   Vaping Use: Never used  Substance Use Topics   Alcohol use: No   Drug use: Yes    Types: Marijuana      Allergies   Bee venom, Toradol [ketorolac tromethamine], and Benadryl [diphenhydramine]   Review of Systems Review of Systems  Constitutional:  Positive for activity change, appetite change and fever. Negative for fatigue.  Respiratory:  Positive for shortness of breath. Negative for cough.   Cardiovascular:  Positive for chest pain.  Gastrointestinal:  Positive for nausea and vomiting. Negative for abdominal pain and diarrhea.  Neurological:  Positive for dizziness, weakness and light-headedness. Negative for headaches.    Physical Exam Triage Vital Signs ED Triage Vitals  Enc Vitals Group     BP 11/08/20 1741 (!) 171/103     Pulse Rate 11/08/20 1741 93     Resp 11/08/20 1741 18     Temp --      Temp Source 11/08/20 1741 Oral     SpO2 11/08/20 1741 99 %     Weight --      Height --      Head Circumference --      Peak Flow --      Pain Score 11/08/20 1740 7     Pain Loc --      Pain Edu? --      Excl. in [place]? --    No data found.  Updated Vital Signs BP (!) 171/103   Pulse 93   Resp 18   SpO2 99%   Visual Acuity Right Eye Distance:   Left Eye Distance:   Bilateral Distance:    Right Eye Near:   Left Eye Near:    Bilateral Near:     Physical Exam Vitals reviewed.  Constitutional:      General: She is awake. She is not in acute distress.    Appearance: Normal appearance. She is normal weight. She is not ill-appearing.     Comments: Pleasant female appears stated age laying on exam room table in no acute distress  HENT:     Head: Normocephalic and atraumatic.  Cardiovascular:     Rate and Rhythm: Normal rate and regular rhythm.     Heart sounds: Normal heart sounds, S1 normal and S2 normal. No murmur heard. Pulmonary:     Effort: Pulmonary effort is normal.     Breath sounds: Normal breath sounds. No wheezing, rhonchi or rales.     Comments: Clear to auscultation bilaterally Chest:     Chest wall: Tenderness present. No deformity or swelling.      Comments: Mild tenderness palpation over anterior chest wall the patient reports this is not exactly the same pressure/pain she is experiencing. Abdominal:     General: Bowel sounds are normal.     Palpations: Abdomen is soft.     Tenderness: There is no abdominal tenderness. There is no right  CVA tenderness, left CVA tenderness, guarding or rebound.     Comments: Benign abdominal exam.  Psychiatric:        Behavior: Behavior is cooperative.     UC Treatments / Results  Labs (all labs ordered are listed, but only abnormal results are displayed) Labs Reviewed  CBG MONITORING, ED - Abnormal; Notable for the following components:      Result Value   Glucose-Capillary 124 (*)    All other components within normal limits    EKG   Radiology No results found.  Procedures Procedures (including critical care time)  Medications Ordered in UC Medications - No data to display  Initial Impression / Assessment and Plan / UC Course  I have reviewed the triage vital signs and the nursing notes.  Pertinent labs & imaging results that were available during my care of the patient were reviewed by me and considered in my medical decision making (see chart for details).      EKG obtained showed normal sinus rhythm with right axis deviation, no acute changes, no significant change compared to 10/19/2020 tracing.  Glucose was appropriate at 124.  Discussed that symptoms could be musculoskeletal but as her pain is not exactly reproducible on exam and she has a constellation of symptoms including shortness of breath and lightheadedness in the setting of elevated blood pressure recommended she go to the emergency room for further evaluation and management.  Discussed that I am also concerned that she may be an adrenal crisis with nausea and vomiting and no supplemental steroids and unfortunately we are unable to get stat labs that would return prior to the end of the evening.  Given severity of  symptoms patient is agreeable to this and will have her son take her directly to the emergency room at [facility] following visit today.  Vital signs are stable at the time of discharge and patient was safe for private transport.   Final Clinical Impressions(s) / UC Diagnoses   Final diagnoses:  Chest pain, unspecified type  Lightheadedness  Nausea and vomiting, intractability of vomiting not specified, unspecified vomiting type  History of Addison's disease  Shortness of breath  Elevated blood pressure reading     Discharge Instructions      Have your son take you directly to the emergency room for further evaluation and management as we discussed.     ED Prescriptions   None    PDMP not reviewed this encounter.   Terrilee Croak, PA-C 11/08/20 1813

## 2020-11-08 NOTE — ED Triage Notes (Signed)
Pt is present today with dizziness, centralized chest pain, and vomiting. Pt states that her sx started two days ago

## 2020-11-08 NOTE — ED Notes (Signed)
Pt states she is  Leaving d/t wait time

## 2021-08-15 ENCOUNTER — Encounter (HOSPITAL_COMMUNITY): Payer: Self-pay | Admitting: Internal Medicine

## 2021-08-15 ENCOUNTER — Inpatient Hospital Stay (HOSPITAL_COMMUNITY)
Admission: EM | Admit: 2021-08-15 | Discharge: 2021-08-21 | DRG: 683 | Disposition: A | Discharge status: Home / Self Care | Payer: Medicare Other | Attending: Internal Medicine | Admitting: Internal Medicine

## 2021-08-15 ENCOUNTER — Emergency Department (HOSPITAL_COMMUNITY): Payer: Medicare Other

## 2021-08-15 DIAGNOSIS — I10 Essential (primary) hypertension: Secondary | ICD-10-CM | POA: Diagnosis present

## 2021-08-15 DIAGNOSIS — Z886 Allergy status to analgesic agent status: Secondary | ICD-10-CM | POA: Diagnosis not present

## 2021-08-15 DIAGNOSIS — F1120 Opioid dependence, uncomplicated: Secondary | ICD-10-CM | POA: Diagnosis present

## 2021-08-15 DIAGNOSIS — E860 Dehydration: Secondary | ICD-10-CM | POA: Diagnosis present

## 2021-08-15 DIAGNOSIS — E876 Hypokalemia: Secondary | ICD-10-CM | POA: Diagnosis not present

## 2021-08-15 DIAGNOSIS — Z79899 Other long term (current) drug therapy: Secondary | ICD-10-CM | POA: Diagnosis not present

## 2021-08-15 DIAGNOSIS — K2990 Gastroduodenitis, unspecified, without bleeding: Secondary | ICD-10-CM | POA: Diagnosis present

## 2021-08-15 DIAGNOSIS — K920 Hematemesis: Secondary | ICD-10-CM | POA: Diagnosis not present

## 2021-08-15 DIAGNOSIS — K2970 Gastritis, unspecified, without bleeding: Secondary | ICD-10-CM | POA: Diagnosis present

## 2021-08-15 DIAGNOSIS — R17 Unspecified jaundice: Secondary | ICD-10-CM | POA: Diagnosis not present

## 2021-08-15 DIAGNOSIS — R1013 Epigastric pain: Secondary | ICD-10-CM | POA: Diagnosis not present

## 2021-08-15 DIAGNOSIS — F319 Bipolar disorder, unspecified: Secondary | ICD-10-CM | POA: Diagnosis present

## 2021-08-15 DIAGNOSIS — G894 Chronic pain syndrome: Secondary | ICD-10-CM | POA: Diagnosis present

## 2021-08-15 DIAGNOSIS — E872 Acidosis, unspecified: Secondary | ICD-10-CM | POA: Diagnosis not present

## 2021-08-15 DIAGNOSIS — N179 Acute kidney failure, unspecified: Principal | ICD-10-CM | POA: Diagnosis present

## 2021-08-15 DIAGNOSIS — E271 Primary adrenocortical insufficiency: Secondary | ICD-10-CM | POA: Diagnosis present

## 2021-08-15 DIAGNOSIS — Z833 Family history of diabetes mellitus: Secondary | ICD-10-CM

## 2021-08-15 DIAGNOSIS — R112 Nausea with vomiting, unspecified: Secondary | ICD-10-CM | POA: Diagnosis present

## 2021-08-15 DIAGNOSIS — Z800 Family history of malignant neoplasm of digestive organs: Secondary | ICD-10-CM | POA: Diagnosis not present

## 2021-08-15 DIAGNOSIS — F1290 Cannabis use, unspecified, uncomplicated: Secondary | ICD-10-CM | POA: Diagnosis present

## 2021-08-15 DIAGNOSIS — E282 Polycystic ovarian syndrome: Secondary | ICD-10-CM | POA: Diagnosis present

## 2021-08-15 DIAGNOSIS — G40909 Epilepsy, unspecified, not intractable, without status epilepticus: Secondary | ICD-10-CM | POA: Diagnosis present

## 2021-08-15 DIAGNOSIS — K861 Other chronic pancreatitis: Secondary | ICD-10-CM | POA: Diagnosis present

## 2021-08-15 DIAGNOSIS — A084 Viral intestinal infection, unspecified: Secondary | ICD-10-CM | POA: Diagnosis present

## 2021-08-15 DIAGNOSIS — Z91030 Bee allergy status: Secondary | ICD-10-CM

## 2021-08-15 DIAGNOSIS — Z888 Allergy status to other drugs, medicaments and biological substances status: Secondary | ICD-10-CM

## 2021-08-15 DIAGNOSIS — E871 Hypo-osmolality and hyponatremia: Secondary | ICD-10-CM | POA: Diagnosis not present

## 2021-08-15 DIAGNOSIS — Z8249 Family history of ischemic heart disease and other diseases of the circulatory system: Secondary | ICD-10-CM | POA: Diagnosis not present

## 2021-08-15 DIAGNOSIS — J690 Pneumonitis due to inhalation of food and vomit: Secondary | ICD-10-CM | POA: Diagnosis not present

## 2021-08-15 DIAGNOSIS — D497 Neoplasm of unspecified behavior of endocrine glands and other parts of nervous system: Secondary | ICD-10-CM | POA: Diagnosis present

## 2021-08-15 DIAGNOSIS — E8809 Other disorders of plasma-protein metabolism, not elsewhere classified: Secondary | ICD-10-CM | POA: Diagnosis not present

## 2021-08-15 DIAGNOSIS — K5900 Constipation, unspecified: Secondary | ICD-10-CM | POA: Diagnosis not present

## 2021-08-15 DIAGNOSIS — N289 Disorder of kidney and ureter, unspecified: Secondary | ICD-10-CM | POA: Diagnosis not present

## 2021-08-15 LAB — I-STAT CHEM 8, ED
BUN: 53 mg/dL — ABNORMAL HIGH (ref 6–20)
Calcium, Ion: 0.84 mmol/L — CL (ref 1.15–1.40)
Chloride: 102 mmol/L (ref 98–111)
Creatinine, Ser: 3.2 mg/dL — ABNORMAL HIGH (ref 0.44–1.00)
Glucose, Bld: 114 mg/dL — ABNORMAL HIGH (ref 70–99)
HCT: 60 % — ABNORMAL HIGH (ref 36.0–46.0)
Hemoglobin: 20.4 g/dL — ABNORMAL HIGH (ref 12.0–15.0)
Potassium: 3.5 mmol/L (ref 3.5–5.1)
Sodium: 132 mmol/L — ABNORMAL LOW (ref 135–145)
TCO2: 19 mmol/L — ABNORMAL LOW (ref 22–32)

## 2021-08-15 LAB — COMPREHENSIVE METABOLIC PANEL
ALT: 38 U/L (ref 0–44)
AST: 45 U/L — ABNORMAL HIGH (ref 15–41)
Albumin: 4.2 g/dL (ref 3.5–5.0)
Alkaline Phosphatase: 66 U/L (ref 38–126)
Anion gap: 21 — ABNORMAL HIGH (ref 5–15)
BUN: 54 mg/dL — ABNORMAL HIGH (ref 6–20)
CO2: 16 mmol/L — ABNORMAL LOW (ref 22–32)
Calcium: 9.5 mg/dL (ref 8.9–10.3)
Chloride: 96 mmol/L — ABNORMAL LOW (ref 98–111)
Creatinine, Ser: 3.19 mg/dL — ABNORMAL HIGH (ref 0.44–1.00)
GFR, Estimated: 18 mL/min — ABNORMAL LOW (ref >60)
Glucose, Bld: 115 mg/dL — ABNORMAL HIGH (ref 70–99)
Potassium: 3.8 mmol/L (ref 3.5–5.1)
Sodium: 133 mmol/L — ABNORMAL LOW (ref 135–145)
Total Bilirubin: 1.6 mg/dL — ABNORMAL HIGH (ref 0.3–1.2)
Total Protein: 8 g/dL (ref 6.5–8.1)

## 2021-08-15 LAB — CBC WITH DIFFERENTIAL/PLATELET
Abs Immature Granulocytes: 0 10*3/uL (ref 0.00–0.07)
Basophils Absolute: 0 10*3/uL (ref 0.0–0.1)
Basophils Relative: 0 %
Eosinophils Absolute: 0 10*3/uL (ref 0.0–0.5)
Eosinophils Relative: 0 %
HCT: 57.2 % — ABNORMAL HIGH (ref 36.0–46.0)
Hemoglobin: 20.1 g/dL — ABNORMAL HIGH (ref 12.0–15.0)
Lymphocytes Relative: 9 %
Lymphs Abs: 1.8 10*3/uL (ref 0.7–4.0)
MCH: 31.3 pg (ref 26.0–34.0)
MCHC: 35.1 g/dL (ref 30.0–36.0)
MCV: 89.1 fL (ref 80.0–100.0)
Monocytes Absolute: 0.6 10*3/uL (ref 0.1–1.0)
Monocytes Relative: 3 %
Neutro Abs: 17.2 10*3/uL — ABNORMAL HIGH (ref 1.7–7.7)
Neutrophils Relative %: 88 %
Platelets: 276 10*3/uL (ref 150–400)
RBC: 6.42 MIL/uL — ABNORMAL HIGH (ref 3.87–5.11)
RDW: 12.8 % (ref 11.5–15.5)
WBC: 19.5 10*3/uL — ABNORMAL HIGH (ref 4.0–10.5)
nRBC: 0 % (ref 0.0–0.2)
nRBC: 0 /100 WBC

## 2021-08-15 LAB — I-STAT BETA HCG BLOOD, ED (MC, WL, AP ONLY): I-stat hCG, quantitative: <5 m[IU]/mL (ref <5)

## 2021-08-15 LAB — LACTIC ACID, PLASMA
Lactic Acid, Venous: 2 mmol/L (ref 0.5–1.9)
Lactic Acid, Venous: 1.1 mmol/L (ref 0.5–1.9)

## 2021-08-15 LAB — LIPASE, BLOOD: Lipase: 22 U/L (ref 11–51)

## 2021-08-15 LAB — HIV ANTIBODY (ROUTINE TESTING W REFLEX): HIV Screen 4th Generation wRfx: NONREACTIVE

## 2021-08-15 MED ORDER — ONDANSETRON HCL 4 MG PO TABS
4.0000 mg | ORAL_TABLET | Freq: Four times a day (QID) | ORAL | Status: DC | PRN
  Administered 2021-08-17 – 2021-08-18 (×2): 4 mg via ORAL
  Filled 2021-08-15 (×2): qty 1

## 2021-08-15 MED ORDER — PROMETHAZINE HCL 25 MG RE SUPP
25.0000 mg | Freq: Four times a day (QID) | RECTAL | Status: DC | PRN
  Administered 2021-08-16 (×2): 25 mg via RECTAL
  Filled 2021-08-15 (×4): qty 1

## 2021-08-15 MED ORDER — CALCIUM GLUCONATE-NACL 1-0.675 GM/50ML-% IV SOLN
1.0000 g | Freq: Once | INTRAVENOUS | Status: AC
  Administered 2021-08-15: 1000 mg via INTRAVENOUS
  Filled 2021-08-15: qty 50

## 2021-08-15 MED ORDER — SODIUM CHLORIDE 0.9 % IV BOLUS
1000.0000 mL | Freq: Once | INTRAVENOUS | Status: AC
  Administered 2021-08-15: 1000 mL via INTRAVENOUS

## 2021-08-15 MED ORDER — ACETAMINOPHEN 325 MG PO TABS
650.0000 mg | ORAL_TABLET | Freq: Four times a day (QID) | ORAL | Status: DC | PRN
  Administered 2021-08-15 – 2021-08-17 (×2): 650 mg via ORAL
  Filled 2021-08-15 (×2): qty 2

## 2021-08-15 MED ORDER — LACTATED RINGERS IV SOLN: INTRAVENOUS | Status: DC

## 2021-08-15 MED ORDER — PANTOPRAZOLE SODIUM 40 MG IV SOLR
40.0000 mg | Freq: Once | INTRAVENOUS | Status: AC
  Administered 2021-08-15: 40 mg via INTRAVENOUS
  Filled 2021-08-15: qty 10

## 2021-08-15 MED ORDER — HYDRALAZINE HCL 20 MG/ML IJ SOLN
5.0000 mg | INTRAMUSCULAR | Status: DC | PRN
  Administered 2021-08-15: 5 mg via INTRAVENOUS
  Filled 2021-08-15 (×2): qty 1

## 2021-08-15 MED ORDER — ONDANSETRON HCL 4 MG/2ML IJ SOLN
4.0000 mg | Freq: Four times a day (QID) | INTRAMUSCULAR | Status: DC | PRN
Start: 2021-08-15 — End: 2021-08-21
  Administered 2021-08-15 – 2021-08-20 (×8): 4 mg via INTRAVENOUS
  Filled 2021-08-15 (×8): qty 2

## 2021-08-15 MED ORDER — BISACODYL 5 MG PO TBEC: 5.0000 mg | DELAYED_RELEASE_TABLET | Freq: Every day | ORAL | Status: DC | PRN

## 2021-08-15 MED ORDER — ACETAMINOPHEN 650 MG RE SUPP: 650.0000 mg | Freq: Four times a day (QID) | RECTAL | Status: DC | PRN

## 2021-08-15 MED ORDER — OXYCODONE HCL 5 MG PO TABS
5.0000 mg | ORAL_TABLET | ORAL | Status: DC | PRN
  Administered 2021-08-15 – 2021-08-21 (×15): 5 mg via ORAL
  Filled 2021-08-15 (×15): qty 1

## 2021-08-15 MED ORDER — PROMETHAZINE HCL 25 MG RE SUPP
25.0000 mg | Freq: Once | RECTAL | Status: AC
  Administered 2021-08-15: 25 mg via RECTAL
  Filled 2021-08-15: qty 1

## 2021-08-15 MED ORDER — METHADONE HCL 10 MG PO TABS
120.0000 mg | ORAL_TABLET | Freq: Every day | ORAL | Status: DC
  Administered 2021-08-15 – 2021-08-21 (×7): 120 mg via ORAL
  Filled 2021-08-15 (×9): qty 12

## 2021-08-15 MED ORDER — LEVONORGESTREL-ETHINYL ESTRAD 0.1-20 MG-MCG PO TABS: 1.0000 | ORAL_TABLET | Freq: Every day | ORAL | Status: DC

## 2021-08-15 MED ORDER — MORPHINE SULFATE (PF) 2 MG/ML IV SOLN
2.0000 mg | INTRAVENOUS | Status: DC | PRN
  Administered 2021-08-16: 2 mg via INTRAVENOUS
  Filled 2021-08-15: qty 1

## 2021-08-15 MED ORDER — POLYETHYLENE GLYCOL 3350 17 G PO PACK: 17.0000 g | PACK | Freq: Every day | ORAL | Status: DC | PRN

## 2021-08-15 MED ORDER — ENOXAPARIN SODIUM 30 MG/0.3ML IJ SOSY
30.0000 mg | PREFILLED_SYRINGE | INTRAMUSCULAR | Status: DC
  Administered 2021-08-15 – 2021-08-17 (×2): 30 mg via SUBCUTANEOUS
  Filled 2021-08-15 (×2): qty 0.3

## 2021-08-15 MED ORDER — SODIUM CHLORIDE 0.9 % IV SOLN: INTRAVENOUS | Status: DC

## 2021-08-15 MED ORDER — SODIUM CHLORIDE 0.9% FLUSH
3.0000 mL | Freq: Two times a day (BID) | INTRAVENOUS | Status: DC
  Administered 2021-08-15 – 2021-08-20 (×10): 3 mL via INTRAVENOUS

## 2021-08-15 MED ORDER — PROCHLORPERAZINE EDISYLATE 10 MG/2ML IJ SOLN
10.0000 mg | Freq: Once | INTRAMUSCULAR | Status: AC
  Administered 2021-08-15: 10 mg via INTRAVENOUS
  Filled 2021-08-15: qty 2

## 2021-08-15 MED ORDER — SODIUM CHLORIDE 0.9 % IV SOLN
1.0000 g | Freq: Once | INTRAVENOUS | Status: AC
  Administered 2021-08-15: 1 g via INTRAVENOUS
  Filled 2021-08-15: qty 10

## 2021-08-15 MED ORDER — HYDROMORPHONE HCL 1 MG/ML IJ SOLN
0.5000 mg | Freq: Once | INTRAMUSCULAR | Status: AC
  Administered 2021-08-15: 0.5 mg via INTRAVENOUS
  Filled 2021-08-15: qty 1

## 2021-08-15 NOTE — ED Triage Notes (Signed)
Pt BIB [facility] per report pt has been vomiting x2 days, tachycardic to 120's, hypertensive to 924'M systolic. Reports eating chicken salad 2 days ago and think it's related. ?

## 2021-08-15 NOTE — ED Provider Notes (Signed)
?[facility] ?Provider Note ? ?CSN: [id number] ?Arrival date & time: 08/15/21 6283 ? ?Chief Complaint(s) ?Emesis ? ?HPI ?Brittany Mullins is a 41 y.o. female with a past medical history listed below here for several days of gradually worsening abdominal discomfort with nausea and nonbloody nonbilious emesis.  Abdominal pain is suprapubic and flank as well as upper.  Worse with palpation.  No alleviating factors.  She denies any diarrhea.  Denies any dysuria but states she has not been able to void since yesterday. ? ?Patient has been unable to tolerate p.o. intake over the past 2 days.  Reports that she had some chicken solid 2 days ago but her symptoms were already coming on prior to eating that.  She denied any recent alcohol or illicit drug use.   ? ?Emesis ? ?Past Medical History ?Past Medical History:  ?Diagnosis Date  ? Addison's disease ([place])   ? Brain tumor (benign) ([place])   ? Hypertension   ? Marijuana use   ? Per pt: "medical marijuana patient"  ? Pancreatitis   ? PCOS (polycystic ovarian syndrome)   ? Pituitary tumor   ? Seizures ([place])   ? ?Patient Active Problem List  ? Diagnosis Date Noted  ? Intractable nausea and vomiting 10/25/2019  ? History of Addison's disease   ? Intractable cyclical vomiting with nausea   ? Opioid dependence ([place]) 06/03/2017  ? Generalized abdominal pain   ? Hypomagnesemia   ? Marijuana use   ? ?Home Medication(s) ?Prior to Admission medications   ?Medication Sig Start Date End Date Taking? Authorizing Provider  ?levonorgestrel-ethinyl estradiol (ALESSE) 0.1-20 MG-MCG tablet Take 1 tablet by mouth daily.   Yes [provider]  ?methadone (DOLOPHINE) 10 MG/ML solution Take 120 mg by mouth daily.   Yes [provider]  ?promethazine (PHENERGAN) 12.5 MG tablet Take 1 tablet (12.5 mg total) by mouth every 8 (eight) hours as needed for nausea or vomiting. 05/25/20  Yes Jaynee Eagles, PA-C  ?fluticasone (FLONASE) 50 MCG/ACT nasal  spray Place 1 spray into both nostrils daily. ?Patient not taking: Reported on 08/15/2021 05/15/20   Caccavale, Sophia, PA-C  ?pantoprazole (PROTONIX) 40 MG tablet Take 1 tablet (40 mg total) by mouth 2 (two) times daily before a meal. 06/05/17 12/24/18  Isaac Bliss, Rayford Halsted, MD  ?                                                                                                                                  ?Allergies ?Bee venom, Toradol [ketorolac tromethamine], and Benadryl [diphenhydramine] ? ?Review of Systems ?Review of Systems  ?Gastrointestinal:  Positive for vomiting.  ?As noted in HPI ? ?Physical Exam ?Vital Signs  ?I have reviewed the triage vital signs ?BP (!) 187/110   Pulse 95   Temp 98.2 ?F (36.8 ?C) (Oral)   Resp 20   Ht '5\' 3"'$  (1.6 m)   Wt 63.5 kg  SpO2 93%   BMI 24.80 kg/m?  ? ?Physical Exam ?Vitals reviewed.  ?Constitutional:   ?   General: She is not in acute distress. ?   Appearance: She is well-developed. She is not diaphoretic.  ?HENT:  ?   Head: Normocephalic and atraumatic.  ?   Right Ear: External ear normal.  ?   Left Ear: External ear normal.  ?   Nose: Nose normal.  ?   Mouth/Throat:  ?   Mouth: Mucous membranes are dry.  ?Eyes:  ?   General: No scleral icterus. ?   Conjunctiva/sclera: Conjunctivae normal.  ?Neck:  ?   Trachea: Phonation normal.  ?Cardiovascular:  ?   Rate and Rhythm: Normal rate and regular rhythm.  ?Pulmonary:  ?   Effort: Pulmonary effort is normal. No respiratory distress.  ?   Breath sounds: No stridor.  ?Abdominal:  ?   General: There is no distension.  ?   Tenderness: There is abdominal tenderness in the periumbilical area and suprapubic area. There is left CVA tenderness. There is no guarding.  ?   Hernia: No hernia is present.  ?Musculoskeletal:     ?   General: Normal range of motion.  ?   Cervical back: Normal range of motion.  ?Neurological:  ?   Mental Status: She is alert and oriented to person, place, and time.  ?Psychiatric:     ?   Behavior:  Behavior normal.  ? ? ?ED Results and Treatments ?Labs ?(all labs ordered are listed, but only abnormal results are displayed) ?Labs Reviewed  ?COMPREHENSIVE METABOLIC PANEL - Abnormal; Notable for the following components:  ?    Result Value  ? Sodium 133 (*)   ? Chloride 96 (*)   ? CO2 16 (*)   ? Glucose, Bld 115 (*)   ? BUN 54 (*)   ? Creatinine, Ser 3.19 (*)   ? AST 45 (*)   ? Total Bilirubin 1.6 (*)   ? GFR, Estimated 18 (*)   ? Anion gap 21 (*)   ? All other components within normal limits  ?CBC WITH DIFFERENTIAL/PLATELET - Abnormal; Notable for the following components:  ? WBC 19.5 (*)   ? RBC 6.42 (*)   ? Hemoglobin 20.1 (*)   ? HCT 57.2 (*)   ? Neutro Abs 17.2 (*)   ? All other components within normal limits  ?I-STAT CHEM 8, ED - Abnormal; Notable for the following components:  ? Sodium 132 (*)   ? BUN 53 (*)   ? Creatinine, Ser 3.20 (*)   ? Glucose, Bld 114 (*)   ? Calcium, Ion 0.84 (*)   ? TCO2 19 (*)   ? Hemoglobin 20.4 (*)   ? HCT 60.0 (*)   ? All other components within normal limits  ?LIPASE, BLOOD  ?URINALYSIS, ROUTINE W REFLEX MICROSCOPIC  ?LACTIC ACID, PLASMA  ?LACTIC ACID, PLASMA  ?I-STAT BETA HCG BLOOD, ED ([facility], [facility], [facility] ONLY)  ?                                                                                                                       ?  EKG ? EKG Interpretation ? ?Date/Time:    ?Ventricular Rate:    ?PR Interval:    ?QRS Duration:   ?QT Interval:    ?QTC Calculation:   ?R Axis:     ?Text Interpretation:   ?  ? ?  ? ?Radiology ?CT Renal Stone Study ? ?Result Date: 08/15/2021 ?CLINICAL DATA:  41 year old female with history of flank pain. EXAM: CT ABDOMEN AND PELVIS WITHOUT CONTRAST TECHNIQUE: Multidetector CT imaging of the abdomen and pelvis was performed following the standard protocol without IV contrast. RADIATION DOSE REDUCTION: This exam was performed according to the departmental dose-optimization program which includes automated exposure control, adjustment of the mA and/or kV  according to patient size and/or use of iterative reconstruction technique. COMPARISON:  CT the abdomen and pelvis 10/25/2019. FINDINGS: Lower chest: Unremarkable. Hepatobiliary: No definite suspicious cystic or solid hepatic lesions are confidently identified on today's noncontrast CT examination. Status post cholecystectomy. Pancreas: No definite pancreatic mass or peripancreatic fluid collections or inflammatory changes are noted on today's noncontrast CT examination. Spleen: Numerous calcified granulomas are noted in the spleen. Otherwise, unremarkable. Adrenals/Urinary Tract: Multiple tiny 2-3 mm nonobstructive calculi are noted within the collecting systems of both kidneys. No additional calculi are noted along the course of either ureter or within the lumen of the urinary bladder. No hydroureteronephrosis. Unenhanced appearance of the kidneys, bilateral adrenal glands and urinary bladder is otherwise unremarkable. Stomach/Bowel: Unenhanced appearance of the stomach is normal. No pathologic dilatation of small bowel or colon. The appendix is not confidently identified and may be surgically absent. Regardless, there are no inflammatory changes noted adjacent to the cecum to suggest the presence of an acute appendicitis at this time. Vascular/Lymphatic: No atherosclerotic calcifications are noted in the abdominal aorta or major pelvic vasculature. No lymphadenopathy noted in the abdomen or pelvis. Reproductive: Status post hysterectomy. Ovaries are unremarkable in appearance. Other: No significant volume of ascites.  No pneumoperitoneum. Musculoskeletal: There are no aggressive appearing lytic or blastic lesions noted in the visualized portions of the skeleton. IMPRESSION: 1. No acute findings are noted in the abdomen or pelvis to account for the patient's symptoms. 2. Multiple 2-3 mm nonobstructive calculi are noted within the collecting systems of both kidneys. No ureteral stones or findings of urinary tract  obstruction are noted at this time. 3. Additional incidental findings, as above. Electronically Signed   By: Vinnie Langton M.D.   On: 08/15/2021 05:45   ? ?Pertinent labs & imaging results that were available during m

## 2021-08-15 NOTE — ED Notes (Signed)
Pt BP high. Cuff repositioned and BP cycled. See vitals. Yates MD paged via Shea Evans for orders.  ?

## 2021-08-15 NOTE — ED Notes (Signed)
ED TO INPATIENT HANDOFF REPORT ? ?ED Nurse Name and Phone #: Mel Almond [phone] ? ?S ?Name/Age/Gender ?Brittany Mullins ?41 y.o. ?female ?Room/Bed: 046C/046C ? ?Code Status ?  Code Status: Full Code ? ?Home/SNF/Other ?Home ?Patient oriented to: self, place, time, and situation ?Is this baseline? Yes  ? ?Triage Complete: Triage complete  ?Chief Complaint ?AKI (acute kidney injury) ([place]) [N17.9] ? ?Triage Note ?Pt BIB [facility] per report pt has been vomiting x2 days, tachycardic to 120's, hypertensive to 253'G systolic. Reports eating chicken salad 2 days ago and think it's related.  ? ?Allergies ?Allergies  ?Allergen Reactions  ? Bee Venom Anaphylaxis  ? Toradol [Ketorolac Tromethamine] Shortness Of Breath  ? Benadryl [Diphenhydramine] Other (See Comments)  ?  States she cannot breathe  ? ? ?Level of Care/Admitting Diagnosis ?ED Disposition   ? ? ED Disposition  ?Admit  ? Condition  ?--  ? Comment  ?Hospital [facility]: [facility] [[id number]] ? Level of Care: Telemetry Medical [104] ? May admit patient to [facility] or [facility] if equivalent level of care is available:: Yes ? Covid Evaluation: Asymptomatic - no recent exposure (last 10 days) testing not required ? Diagnosis: AKI (acute kidney injury) ([place]) [[id number]] ? Admitting Physician: Karmen Bongo [[id number]] ? Attending Physician: Karmen Bongo [[id number]] ? Estimated length of stay: past midnight tomorrow ? Certification:: I certify this patient will need inpatient services for at least 2 midnights ?  ?  ? ?  ? ? ?B ?Medical/Surgery History ?Past Medical History:  ?Diagnosis Date  ? Addison's disease ([place])   ? Brain tumor (benign) ([place])   ? Hypertension   ? Marijuana use   ? Per pt: "medical marijuana patient"  ? Pancreatitis   ? PCOS (polycystic ovarian syndrome)   ? Pituitary tumor   ? Seizures ([place])   ? ?Past Surgical History:  ?Procedure Laterality Date  ? ABDOMINAL HYSTERECTOMY    ? APPENDECTOMY    ? CESAREAN SECTION    ? CHOLECYSTECTOMY    ? ELBOW  SURGERY    ? kidney stent    ? pancreatic stent    ?  ? ?A ?IV Location/Drains/Wounds ?Patient Lines/Drains/Airways Status   ? ? Active Line/Drains/Airways   ? ? Name Placement date Placement time Site Days  ? Peripheral IV 08/15/21 20 G Anterior;Left Forearm 08/15/21  0747  Forearm  less than 1  ? ?  ?  ? ?  ? ? ?Intake/Output Last 24 hours ? ?Intake/Output Summary (Last 24 hours) at 08/15/2021 2220 ?Last data filed at 08/15/2021 0945 ?Gross per 24 hour  ?Intake 1000 ml  ?Output --  ?Net 1000 ml  ? ? ?Labs/Imaging ?Results for orders placed or performed during the hospital encounter of 08/15/21 (from the past 48 hour(s))  ?Comprehensive metabolic panel     Status: Abnormal  ? Collection Time: 08/15/21  4:13 AM  ?Result Value Ref Range  ? Sodium 133 (L) 135 - 145 mmol/L  ? Potassium 3.8 3.5 - 5.1 mmol/L  ? Chloride 96 (L) 98 - 111 mmol/L  ? CO2 16 (L) 22 - 32 mmol/L  ? Glucose, Bld 115 (H) 70 - 99 mg/dL  ?  Comment: Glucose reference range applies only to samples taken after fasting for at least 8 hours.  ? BUN 54 (H) 6 - 20 mg/dL  ? Creatinine, Ser 3.19 (H) 0.44 - 1.00 mg/dL  ? Calcium 9.5 8.9 - 10.3 mg/dL  ? Total Protein 8.0 6.5 - 8.1 g/dL  ? Albumin  4.2 3.5 - 5.0 g/dL  ? AST 45 (H) 15 - 41 U/L  ? ALT 38 0 - 44 U/L  ? Alkaline Phosphatase 66 38 - 126 U/L  ? Total Bilirubin 1.6 (H) 0.3 - 1.2 mg/dL  ? GFR, Estimated 18 (L) >60 mL/min  ?  Comment: (NOTE) ?Calculated using the CKD-EPI Creatinine Equation (2021) ?  ? Anion gap 21 (H) 5 - 15  ?  Comment: INITIAL RUN ONLY. MICROTAINER SAMPLE, NONE LEFT TO REPEAT ELECTROLYTES FOR VERIFICATION. [place] ?Performed at [facility], [place] [address] [place], [place] [address] ?  ?Lipase, blood     Status: None  ? Collection Time: 08/15/21  4:13 AM  ?Result Value Ref Range  ? Lipase 22 11 - 51 U/L  ?  Comment: Performed at [facility], [place] [address] [place], [place] [address]  ?CBC with Diff     Status: Abnormal  ? Collection Time: 08/15/21  4:13 AM  ?Result Value Ref  Range  ? WBC 19.5 (H) 4.0 - 10.5 K/uL  ? RBC 6.42 (H) 3.87 - 5.11 MIL/uL  ? Hemoglobin 20.1 (H) 12.0 - 15.0 g/dL  ? HCT 57.2 (H) 36.0 - 46.0 %  ? MCV 89.1 80.0 - 100.0 fL  ? MCH 31.3 26.0 - 34.0 pg  ? MCHC 35.1 30.0 - 36.0 g/dL  ? RDW 12.8 11.5 - 15.5 %  ? Platelets 276 150 - 400 K/uL  ? nRBC 0.0 0.0 - 0.2 %  ? Neutrophils Relative % 88 %  ? Neutro Abs 17.2 (H) 1.7 - 7.7 K/uL  ? Lymphocytes Relative 9 %  ? Lymphs Abs 1.8 0.7 - 4.0 K/uL  ? Monocytes Relative 3 %  ? Monocytes Absolute 0.6 0.1 - 1.0 K/uL  ? Eosinophils Relative 0 %  ? Eosinophils Absolute 0.0 0.0 - 0.5 K/uL  ? Basophils Relative 0 %  ? Basophils Absolute 0.0 0.0 - 0.1 K/uL  ? nRBC 0 0 /100 WBC  ? Abs Immature Granulocytes 0.00 0.00 - 0.07 K/uL  ?  Comment: Performed at [facility], [place] [address], [place], [place] [address]  ?I-Stat beta hCG blood, ED     Status: None  ? Collection Time: 08/15/21  4:23 AM  ?Result Value Ref Range  ? I-stat hCG, quantitative <5.0 <5 mIU/mL  ? Comment 3          ?  Comment:   GEST. AGE      CONC.  (mIU/mL) ?  <=1 WEEK        5 - 50 ?    2 WEEKS       50 - 500 ?    3 WEEKS       100 - 10,000 ?    4 WEEKS     1,000 - 30,000 ?       ?FEMALE AND NON-PREGNANT FEMALE: ?    LESS THAN 5 mIU/mL ?  ?I-Stat Chem 8, ED     Status: Abnormal  ? Collection Time: 08/15/21  4:25 AM  ?Result Value Ref Range  ? Sodium 132 (L) 135 - 145 mmol/L  ? Potassium 3.5 3.5 - 5.1 mmol/L  ? Chloride 102 98 - 111 mmol/L  ? BUN 53 (H) 6 - 20 mg/dL  ? Creatinine, Ser 3.20 (H) 0.44 - 1.00 mg/dL  ? Glucose, Bld 114 (H) 70 - 99 mg/dL  ?  Comment: Glucose reference range applies only to samples taken after fasting for at  least 8 hours.  ? Calcium, Ion 0.84 (LL) 1.15 - 1.40 mmol/L  ? TCO2 19 (L) 22 - 32 mmol/L  ? Hemoglobin 20.4 (H) 12.0 - 15.0 g/dL  ? HCT 60.0 (H) 36.0 - 46.0 %  ? Comment NOTIFIED PHYSICIAN   ?Lactic acid, plasma     Status: Abnormal  ? Collection Time: 08/15/21  8:19 AM  ?Result Value Ref Range  ? Lactic Acid, Venous 2.0 (HH) 0.5 - 1.9  mmol/L  ?  Comment: CRITICAL RESULT CALLED TO, READ BACK BY AND VERIFIED WITH: ?M.DOSS RN 08/15/2021 [id number] DAVISB ?Performed at [facility], [place] [address] [place], [place] [address] ?  ?HIV Antibody (routine testing w rflx)     Status: None  ? Collection Time: 08/15/21  6:21 PM  ?Result Value Ref Range  ? HIV Screen 4th Generation wRfx Non Reactive Non Reactive  ?  Comment: Performed at [facility], [place] [address] [place], [place] [address]  ?Lactic acid, plasma     Status: None  ? Collection Time: 08/15/21  9:05 PM  ?Result Value Ref Range  ? Lactic Acid, Venous 1.1 0.5 - 1.9 mmol/L  ?  Comment: Performed at [facility], [place] [address] [place], [place] [address]  ? ?CT Renal Stone Study ? ?Result Date: 08/15/2021 ?CLINICAL DATA:  41 year old female with history of flank pain. EXAM: CT ABDOMEN AND PELVIS WITHOUT CONTRAST TECHNIQUE: Multidetector CT imaging of the abdomen and pelvis was performed following the standard protocol without IV contrast. RADIATION DOSE REDUCTION: This exam was performed according to the departmental dose-optimization program which includes automated exposure control, adjustment of the mA and/or kV according to patient size and/or use of iterative reconstruction technique. COMPARISON:  CT the abdomen and pelvis 10/25/2019. FINDINGS: Lower chest: Unremarkable. Hepatobiliary: No definite suspicious cystic or solid hepatic lesions are confidently identified on today's noncontrast CT examination. Status post cholecystectomy. Pancreas: No definite pancreatic mass or peripancreatic fluid collections or inflammatory changes are noted on today's noncontrast CT examination. Spleen: Numerous calcified granulomas are noted in the spleen. Otherwise, unremarkable. Adrenals/Urinary Tract: Multiple tiny 2-3 mm nonobstructive calculi are noted within the collecting systems of both kidneys. No additional calculi are noted along the course of either ureter or within the lumen of the  urinary bladder. No hydroureteronephrosis. Unenhanced appearance of the kidneys, bilateral adrenal glands and urinary bladder is otherwise unremarkable. Stomach/Bowel: Unenhanced appearance of the stomach

## 2021-08-15 NOTE — H&P (Signed)
?History and Physical  ? ? ?Patient: Brittany Mullins IOE:[id number] DOB: 08/07/1980 ?DOA: 08/15/2021 ?DOS: the patient was seen and examined on 08/15/2021 ?PCP: Julian Hy, PA-C  ?Patient coming from: Home - lives with boyfriend and sons; NOK:  Carter Kitten, [phone] ? ? ?Chief Complaint: N/V ? ?HPI: Brittany Mullins is a 41 y.o. female with medical history significant of cyclical vomiting, chronic pain on methadone, seizures, and THC use presenting with n/v. She reports n/v x 2 days.  She is having abdominal pain.  Hot/cold.  She takes methadone and could not keep it down yesterday.  She is not sure what started it, just woke up sick.  This is the first time this has happened to her.  No diarrhea.  Abdominal pain is midepigastric.  Emesis has coffee ground appearance.  She had a bleeding ulcer in childhood.  Has never seen a GI doctor.  She has seen blood maybe 3-4 times.  She has not had a BM.  Denies use of NSAIDs or powders. ? ? ? ?ER Course:  AKI.  Cyclical vomiting.   Difficulty tolerating PO.  UA pending. ? ? ? ? ?Review of Systems: As mentioned in the history of present illness. All other systems reviewed and are negative. ?Past Medical History:  ?Diagnosis Date  ? Addison's disease ([place])   ? Brain tumor (benign) ([place])   ? Hypertension   ? Marijuana use   ? Per pt: "medical marijuana patient"  ? Pancreatitis   ? PCOS (polycystic ovarian syndrome)   ? Pituitary tumor   ? Seizures ([place])   ? ?Past Surgical History:  ?Procedure Laterality Date  ? ABDOMINAL HYSTERECTOMY    ? APPENDECTOMY    ? CESAREAN SECTION    ? CHOLECYSTECTOMY    ? ELBOW SURGERY    ? kidney stent    ? pancreatic stent    ? ?Social History:  reports that she has never smoked. She has never used smokeless tobacco. She reports current drug use. Drug: Marijuana. She reports that she does not drink alcohol. ? ?Allergies  ?Allergen Reactions  ? Bee Venom Anaphylaxis  ? Toradol [Ketorolac Tromethamine] Shortness Of Breath  ? Benadryl  [Diphenhydramine] Other (See Comments)  ?  States she cannot breathe  ? ? ?Family History  ?Problem Relation Age of Onset  ? Cancer Mother   ? Hypertension Mother   ? Diabetes Maternal Grandmother   ? Diabetes Maternal Grandfather   ? ? ?Prior to Admission medications   ?Medication Sig Start Date End Date Taking? Authorizing Provider  ?levonorgestrel-ethinyl estradiol (ALESSE) 0.1-20 MG-MCG tablet Take 1 tablet by mouth daily.   Yes [provider]  ?methadone (DOLOPHINE) 10 MG/ML solution Take 120 mg by mouth daily.   Yes [provider]  ?promethazine (PHENERGAN) 12.5 MG tablet Take 1 tablet (12.5 mg total) by mouth every 8 (eight) hours as needed for nausea or vomiting. 05/25/20  Yes Jaynee Eagles, PA-C  ?fluticasone (FLONASE) 50 MCG/ACT nasal spray Place 1 spray into both nostrils daily. ?Patient not taking: Reported on 08/15/2021 05/15/20   Caccavale, Sophia, PA-C  ?pantoprazole (PROTONIX) 40 MG tablet Take 1 tablet (40 mg total) by mouth 2 (two) times daily before a meal. 06/05/17 12/24/18  Isaac Bliss, Rayford Halsted, MD  ? ? ?Physical Exam: ?Vitals:  ? 08/15/21 1545 08/15/21 1600 08/15/21 1615 08/15/21 1630  ?BP: 116/73 108/72 112/70 113/65  ?Pulse: 91 91 82 83  ?Resp: '15 15 13 14  '$ ?Temp:      ?TempSrc:      ?  SpO2: 98% 96% 97% 96%  ?Weight:      ?Height:      ? ?General:  Appears calm and comfortable and is in NAD ?Eyes:  PERRL, EOMI, normal lids, iris ?ENT:  grossly normal hearing, lips & tongue, moderately dry mm ?Neck:  no LAD, masses or thyromegaly ?Cardiovascular:  RR with mild tachycardia, no m/r/g. No LE edema.  ?Respiratory:   CTA bilaterally with no wheezes/rales/rhonchi.  Normal respiratory effort. ?Abdomen:  soft, epigastric TTP, ND ?Skin:  no rash or induration seen on limited exam, +tattoos ?Musculoskeletal:  grossly normal tone BUE/BLE, good ROM, no bony abnormality ?Psychiatric:  blunted mood and affect, speech fluent and appropriate, AOx3 ?Neurologic:  CN 2-12 grossly intact, moves  all extremities in coordinated fashion ? ? ?Radiological Exams on Admission: ?Independently reviewed - see discussion in A/P where applicable ? ?CT Renal Stone Study ? ?Result Date: 08/15/2021 ?CLINICAL DATA:  41 year old female with history of flank pain. EXAM: CT ABDOMEN AND PELVIS WITHOUT CONTRAST TECHNIQUE: Multidetector CT imaging of the abdomen and pelvis was performed following the standard protocol without IV contrast. RADIATION DOSE REDUCTION: This exam was performed according to the departmental dose-optimization program which includes automated exposure control, adjustment of the mA and/or kV according to patient size and/or use of iterative reconstruction technique. COMPARISON:  CT the abdomen and pelvis 10/25/2019. FINDINGS: Lower chest: Unremarkable. Hepatobiliary: No definite suspicious cystic or solid hepatic lesions are confidently identified on today's noncontrast CT examination. Status post cholecystectomy. Pancreas: No definite pancreatic mass or peripancreatic fluid collections or inflammatory changes are noted on today's noncontrast CT examination. Spleen: Numerous calcified granulomas are noted in the spleen. Otherwise, unremarkable. Adrenals/Urinary Tract: Multiple tiny 2-3 mm nonobstructive calculi are noted within the collecting systems of both kidneys. No additional calculi are noted along the course of either ureter or within the lumen of the urinary bladder. No hydroureteronephrosis. Unenhanced appearance of the kidneys, bilateral adrenal glands and urinary bladder is otherwise unremarkable. Stomach/Bowel: Unenhanced appearance of the stomach is normal. No pathologic dilatation of small bowel or colon. The appendix is not confidently identified and may be surgically absent. Regardless, there are no inflammatory changes noted adjacent to the cecum to suggest the presence of an acute appendicitis at this time. Vascular/Lymphatic: No atherosclerotic calcifications are noted in the abdominal  aorta or major pelvic vasculature. No lymphadenopathy noted in the abdomen or pelvis. Reproductive: Status post hysterectomy. Ovaries are unremarkable in appearance. Other: No significant volume of ascites.  No pneumoperitoneum. Musculoskeletal: There are no aggressive appearing lytic or blastic lesions noted in the visualized portions of the skeleton. IMPRESSION: 1. No acute findings are noted in the abdomen or pelvis to account for the patient's symptoms. 2. Multiple 2-3 mm nonobstructive calculi are noted within the collecting systems of both kidneys. No ureteral stones or findings of urinary tract obstruction are noted at this time. 3. Additional incidental findings, as above. Electronically Signed   By: Vinnie Langton M.D.   On: 08/15/2021 05:45   ? ?EKG: not done ? ? ?Labs on Admission: I have personally reviewed the available labs and imaging studies at the time of the admission. ? ?Pertinent labs:   ? ?CO2 16 ?Glucose 115 ?BUN 54/Creatinine 3.19/GFR 18; 13/0.95/>60 in 09/2020 ?WBC 19.5 ?HCG negative ?UA pending ? ? ?Assessment and Plan: ?Principal Problem: ?  Intractable nausea and vomiting ?Active Problems: ?  Opioid dependence ([place]) ?  Marijuana use ?  AKI (acute kidney injury) ([place]) ?  ?Intractable n/v ?-Patient presenting with  recurrent n/v and abdominal pain ?-Has reported h/o Addison's disease but is not taking medications for this  ?-Has chronic pain, on methadone - withdrawal is less likely with methadone since it is so long-acting but this is a consideration ?-May be related to cannabinoid hyperemesis syndrome, as she is continuing to use ?-Imaging today unremarkable other than small non-obstructive nephrolithiasis ?-Overall, her labs and imaging appear benign at this time other than significant AKI ?-Will admit to telemetry ?-Encourage marijuana cessation and a good bowel regimen as well as behavioral health support  ?-Elevated lactate is thought to be related to nv//dehydration; will  trend ? ?AKI ?-Likely pre-renal associated with intractable n/v ?-Will hydrate and continue to follow ? ?Chronic pain syndrome ?-I have reviewed this patient in the [place] Controlled Substances Reporting System.  There are

## 2021-08-16 ENCOUNTER — Encounter (HOSPITAL_COMMUNITY)
Admission: EM | Disposition: A | Discharge status: Home / Self Care | Payer: Self-pay | Source: Home / Self Care | Attending: Internal Medicine

## 2021-08-16 ENCOUNTER — Encounter (HOSPITAL_COMMUNITY): Payer: Self-pay | Admitting: Internal Medicine

## 2021-08-16 ENCOUNTER — Inpatient Hospital Stay (HOSPITAL_COMMUNITY): Payer: Medicare Other | Admitting: Certified Registered Nurse Anesthetist

## 2021-08-16 ENCOUNTER — Other Ambulatory Visit: Payer: Self-pay

## 2021-08-16 DIAGNOSIS — R112 Nausea with vomiting, unspecified: Secondary | ICD-10-CM | POA: Diagnosis not present

## 2021-08-16 DIAGNOSIS — E872 Acidosis, unspecified: Secondary | ICD-10-CM

## 2021-08-16 DIAGNOSIS — K920 Hematemesis: Secondary | ICD-10-CM

## 2021-08-16 DIAGNOSIS — F1120 Opioid dependence, uncomplicated: Secondary | ICD-10-CM

## 2021-08-16 DIAGNOSIS — F1290 Cannabis use, unspecified, uncomplicated: Secondary | ICD-10-CM

## 2021-08-16 DIAGNOSIS — K2970 Gastritis, unspecified, without bleeding: Secondary | ICD-10-CM

## 2021-08-16 DIAGNOSIS — N179 Acute kidney failure, unspecified: Secondary | ICD-10-CM | POA: Diagnosis not present

## 2021-08-16 DIAGNOSIS — R1013 Epigastric pain: Secondary | ICD-10-CM

## 2021-08-16 DIAGNOSIS — E876 Hypokalemia: Secondary | ICD-10-CM

## 2021-08-16 DIAGNOSIS — N289 Disorder of kidney and ureter, unspecified: Secondary | ICD-10-CM

## 2021-08-16 DIAGNOSIS — I10 Essential (primary) hypertension: Secondary | ICD-10-CM

## 2021-08-16 HISTORY — PX: ESOPHAGOGASTRODUODENOSCOPY (EGD) WITH PROPOFOL: SHX5813

## 2021-08-16 HISTORY — PX: BIOPSY: SHX5522

## 2021-08-16 LAB — CBC
HCT: 38.4 % (ref 36.0–46.0)
Hemoglobin: 13.4 g/dL (ref 12.0–15.0)
MCH: 31.6 pg (ref 26.0–34.0)
MCHC: 34.9 g/dL (ref 30.0–36.0)
MCV: 90.6 fL (ref 80.0–100.0)
Platelets: 179 10*3/uL (ref 150–400)
RBC: 4.24 MIL/uL (ref 3.87–5.11)
RDW: 13 % (ref 11.5–15.5)
WBC: 11.2 10*3/uL — ABNORMAL HIGH (ref 4.0–10.5)
nRBC: 0 % (ref 0.0–0.2)

## 2021-08-16 LAB — RAPID URINE DRUG SCREEN, HOSP PERFORMED
Amphetamines: NOT DETECTED
Barbiturates: NOT DETECTED
Benzodiazepines: NOT DETECTED
Cocaine: NOT DETECTED
Opiates: NOT DETECTED
Tetrahydrocannabinol: POSITIVE — AB

## 2021-08-16 LAB — COMPREHENSIVE METABOLIC PANEL
ALT: 25 U/L (ref 0–44)
AST: 36 U/L (ref 15–41)
Albumin: 2.8 g/dL — ABNORMAL LOW (ref 3.5–5.0)
Alkaline Phosphatase: 39 U/L (ref 38–126)
Anion gap: 8 (ref 5–15)
BUN: 48 mg/dL — ABNORMAL HIGH (ref 6–20)
CO2: 19 mmol/L — ABNORMAL LOW (ref 22–32)
Calcium: 8.2 mg/dL — ABNORMAL LOW (ref 8.9–10.3)
Chloride: 109 mmol/L (ref 98–111)
Creatinine, Ser: 2.32 mg/dL — ABNORMAL HIGH (ref 0.44–1.00)
GFR, Estimated: 27 mL/min — ABNORMAL LOW (ref >60)
Glucose, Bld: 64 mg/dL — ABNORMAL LOW (ref 70–99)
Potassium: 3.3 mmol/L — ABNORMAL LOW (ref 3.5–5.1)
Sodium: 136 mmol/L (ref 135–145)
Total Bilirubin: 1.4 mg/dL — ABNORMAL HIGH (ref 0.3–1.2)
Total Protein: 5.2 g/dL — ABNORMAL LOW (ref 6.5–8.1)

## 2021-08-16 LAB — MAGNESIUM: Magnesium: 2 mg/dL (ref 1.7–2.4)

## 2021-08-16 LAB — LACTIC ACID, PLASMA: Lactic Acid, Venous: 0.7 mmol/L (ref 0.5–1.9)

## 2021-08-16 SURGERY — ESOPHAGOGASTRODUODENOSCOPY (EGD) WITH PROPOFOL
Anesthesia: Monitor Anesthesia Care

## 2021-08-16 MED ORDER — METOCLOPRAMIDE HCL 5 MG/ML IJ SOLN
10.0000 mg | Freq: Three times a day (TID) | INTRAMUSCULAR | Status: AC
  Administered 2021-08-16 – 2021-08-19 (×9): 10 mg via INTRAVENOUS
  Filled 2021-08-16 (×9): qty 2

## 2021-08-16 MED ORDER — LIDOCAINE 2% (20 MG/ML) 5 ML SYRINGE
INTRAMUSCULAR | Status: DC | PRN
  Administered 2021-08-16: 80 mg via INTRAVENOUS

## 2021-08-16 MED ORDER — HYDRALAZINE HCL 20 MG/ML IJ SOLN
10.0000 mg | INTRAMUSCULAR | Status: DC | PRN
  Administered 2021-08-16 – 2021-08-21 (×3): 10 mg via INTRAVENOUS
  Filled 2021-08-16 (×3): qty 1

## 2021-08-16 MED ORDER — PANTOPRAZOLE SODIUM 40 MG PO TBEC
40.0000 mg | DELAYED_RELEASE_TABLET | Freq: Two times a day (BID) | ORAL | Status: DC
  Administered 2021-08-16 – 2021-08-21 (×11): 40 mg via ORAL
  Filled 2021-08-16 (×11): qty 1

## 2021-08-16 MED ORDER — PROPOFOL 10 MG/ML IV BOLUS
INTRAVENOUS | Status: DC | PRN
  Administered 2021-08-16 (×3): 20 mg via INTRAVENOUS

## 2021-08-16 MED ORDER — PROPOFOL 500 MG/50ML IV EMUL
INTRAVENOUS | Status: DC | PRN
  Administered 2021-08-16: 125 ug/kg/min via INTRAVENOUS

## 2021-08-16 MED ORDER — HYDRALAZINE HCL 20 MG/ML IJ SOLN: 10.0000 mg | INTRAMUSCULAR | Status: DC | PRN

## 2021-08-16 MED ORDER — BOOST / RESOURCE BREEZE PO LIQD CUSTOM
1.0000 | Freq: Three times a day (TID) | ORAL | Status: DC
Start: 2021-08-16 — End: 2021-08-21
  Administered 2021-08-16 – 2021-08-17 (×3): 1 via ORAL

## 2021-08-16 MED ORDER — SODIUM CHLORIDE 0.9 % IV SOLN: INTRAVENOUS | Status: DC | PRN

## 2021-08-16 MED ORDER — MORPHINE SULFATE (PF) 2 MG/ML IV SOLN
2.0000 mg | INTRAVENOUS | Status: DC | PRN
  Administered 2021-08-16 – 2021-08-20 (×14): 2 mg via INTRAVENOUS
  Filled 2021-08-16 (×14): qty 1

## 2021-08-16 MED ORDER — SODIUM CHLORIDE 0.9 % IV SOLN
INTRAVENOUS | Status: DC
Start: 2021-08-16 — End: 2021-08-16

## 2021-08-16 MED ORDER — PROCHLORPERAZINE EDISYLATE 10 MG/2ML IJ SOLN
10.0000 mg | Freq: Four times a day (QID) | INTRAMUSCULAR | Status: DC | PRN
  Administered 2021-08-16 – 2021-08-20 (×4): 10 mg via INTRAVENOUS
  Filled 2021-08-16 (×4): qty 2

## 2021-08-16 MED ORDER — POTASSIUM CHLORIDE 10 MEQ/100ML IV SOLN
10.0000 meq | INTRAVENOUS | Status: AC
  Administered 2021-08-16 (×4): 10 meq via INTRAVENOUS
  Filled 2021-08-16: qty 100

## 2021-08-16 MED ORDER — PHENYLEPHRINE HCL (PRESSORS) 10 MG/ML IV SOLN
INTRAVENOUS | Status: DC | PRN
  Administered 2021-08-16: 80 ug via INTRAVENOUS

## 2021-08-16 MED ORDER — SODIUM CHLORIDE 0.9 % IV SOLN: INTRAVENOUS | Status: DC

## 2021-08-16 SURGICAL SUPPLY — 15 items

## 2021-08-16 NOTE — Op Note (Signed)
[facility] ?Patient Name: Brittany Mullins ?Procedure Date : 08/16/2021 ?MRN: [id number] ?Attending MD: Gerrit Heck , MD ?Date of Birth: Oct 25, 1980 ?CSN: [id number] ?Age: 41 ?Admit Type: Inpatient ?Procedure:                Upper GI endoscopy ?Indications:              Epigastric abdominal pain, Nausea with vomiting ?Providers:                Gerrit Heck, MD, Grace Isaac, RN, Jacquelin Hawking  ?                          Houle, Technician ?Referring MD:              ?Medicines:                Monitored Anesthesia Care ?Complications:            No immediate complications. ?Estimated Blood Loss:     Estimated blood loss was minimal. ?Procedure:                Pre-Anesthesia Assessment: ?                          - Prior to the procedure, a History and Physical  ?                          was performed, and patient medications and  ?                          allergies were reviewed. The patient's tolerance of  ?                          previous anesthesia was also reviewed. The risks  ?                          and benefits of the procedure and the sedation  ?                          options and risks were discussed with the patient.  ?                          All questions were answered, and informed consent  ?                          was obtained. Prior Anticoagulants: The patient has  ?                          taken no previous anticoagulant or antiplatelet  ?                          agents. ASA Grade Assessment: III - A patient with  ?                          severe systemic disease. After reviewing the risks  ?  and benefits, the patient was deemed in  ?                          satisfactory condition to undergo the procedure. ?                          After obtaining informed consent, the endoscope was  ?                          passed under direct vision. Throughout the  ?                          procedure, the patient's blood pressure, pulse, and  ?                           oxygen saturations were monitored continuously. The  ?                          [id number]-[id number] ([id number]) Olympus endoscope was introduced  ?                          through the mouth, and advanced to the second part  ?                          of duodenum. The upper GI endoscopy was  ?                          accomplished without difficulty. The patient  ?                          tolerated the procedure well. ?Scope In: ?Scope Out: ?Findings: ?     The entire esophagus was normal. ?     The Z-line was regular and was found 39 cm from the incisors. ?     Moderate inflammation characterized by congestion (edema) and erythema  ?     was found in the entire examined stomach. There was small amounts of  ?     scattered heme, but no ulcers or active bleeding noted. Biopsies were  ?     taken with a cold forceps for Helicobacter pylori testing. Estimated  ?     blood loss was minimal. There was some retained food material in the  ?     proximal stomach which decreased some of the visualization in the  ?     proximal gastric body and fundus. This was lavaged with improved  ?     visualization and no additional patholgy noted. The stomach was slightly  ?     J shaped, but the pylorus was patent and easily traversed. ?     The examined duodenum was normal. ?Impression:               - Normal esophagus. ?                          - Z-line regular, 39 cm from the incisors. ?                          -  Moderate gastritis. Biopsied. ?                          - Normal examined duodenum. ?Recommendation:           - Return patient to hospital ward for ongoing care. ?                          - Will restart clear liquids now and can advance  ?                          diet as tolerated. ?                          - Continue present medications. ?                          - Use Protonix (pantoprazole) 40 mg PO BID for 6  ?                          weeks to promote mucosal healing, then reduce to 40  ?                           mg daily and can titrate off if no additional GI  ?                          symptoms. ?                          - Reglan 10 mg IV Q8 hours scheduled x72 hours.  ?                          When symptoms improving, can change to PO prn. ?                          - Continue Zofran and Phenergan prn as prescribed. ?                          - Await pathology results. ?                          - Please do not hesitate to contact the inpatient  ?                          GI service with additional questions or concerns. ?Procedure Code(s):        --- Professional --- ?                          [phone], Esophagogastroduodenoscopy, flexible,  ?                          transoral; with biopsy, single or multiple ?Diagnosis Code(s):        --- Professional --- ?  K29.70, Gastritis, unspecified, without bleeding ?                          R10.13, Epigastric pain ?                          R11.2, Nausea with vomiting, unspecified ?CPT copyright 2019 American Medical Association. All rights reserved. ?The codes documented in this report are preliminary and upon coder review may  ?be revised to meet current compliance requirements. ?Gerrit Heck, MD ?08/16/2021 1:00:06 PM ?Number of Addenda: 0 ?

## 2021-08-16 NOTE — Hospital Course (Addendum)
The patient is [name] with a past medical history significant for but limited to history of cyclical vomiting, history of chronic pain syndrome on methadone, seizure disorder, and THC use as well as other comorbidities including a pituitary tumor and PCOS and Addison's disease who presented with intractable nausea, vomiting and abdominal pain.  She takes methadone and not able to keep it down.  She states that she woke up sick a few days ago and this was the first time this happened to her.  She denies any diarrhea but she is described abdominal pain is midepigastric.  She states that she had some emesis that was coffee-ground in appearance and had 3-4 bloody episodes of this.  She indicated that she had a bleeding ulcer in childhood and has never seen a GI doctor afterwards.  She denies any bloody bowel movements and denies any NSAIDs or Goody powders.  In the ED she is found to have an AKI and she was having cyclical vomiting and difficulty tolerating p.o.  She is given IV fluid resuscitation and given 3 L of normal saline boluses and then now placed on maintenance IV fluids.  Given that her continued nausea and vomiting and concern for coffee-ground emesis GI has been consulted for further evaluation and recommendations.  GI took the patient for an EGD and it showed moderate gastritis that was biopsied. She was stared on a CLD and placed on Pantoprazole 40 mg po BID x6 weeks sand then 40 mg po Daily afterwards. She was also started on IV Reglan 10 mg q8h x 72 hours. Renal Fxn continues to Improve but still not normal.  Diet was advanced to full liquid yesterday and is now soft now.  She is still complaining of some nausea and now having significant amount of diarrhea so we will check her GI pathogen panel.  Continue with maintenance IV fluid hydration again and now BUN/Cr is improved to 13/1.17.   She is to have significant amount of diarrhea and continues to be nauseous and states  that she vomited.  Not able to really tolerate her soft diet.  We will have GI evaluate again.  Low suspicion for C. difficile given that she is afebrile and has no leukocytosis.  Her diarrhea could have been in the setting of laxatives that was superimposed on her acute viral gastroenteritis.   GI was consulted and checked for C. difficile and her antigen was positive and her toxin was negative and diarrhea is improved.  Is not C. difficile and GI recommended obtaining a CT scan of the abdomen pelvis which was unrevealing for her source of abdominal pain but did show some nephrolithiasis and did also show a left lower lobe pneumonia likely in setting of aspiration from her vomiting.  She has now been initiated on IV Unasyn.  Continues to have significant abdominal pain and was in tears this morning.  GI recommends continue current diet as tolerated and recommending using oral Phenergan as she feels better with this for her nausea and they feel that she can be discharged with this at home  He is much improved and her Unasyn was changed to Augmentin for 5 days total.  She is medically stable to be discharged home given her improvement and will follow up with her PCP as well as gastroenterology in outpatient setting

## 2021-08-16 NOTE — Anesthesia Postprocedure Evaluation (Signed)
Anesthesia Post Note ? ?Patient: Lamija Besse ? ?Procedure(s) Performed: ESOPHAGOGASTRODUODENOSCOPY (EGD) WITH PROPOFOL ?BIOPSY ? ?  ? ?Patient location during evaluation: PACU ?Anesthesia Type: MAC ?Level of consciousness: awake and alert ?Pain management: pain level controlled ?Vital Signs Assessment: post-procedure vital signs reviewed and stable ?Respiratory status: spontaneous breathing, nonlabored ventilation, respiratory function stable and patient connected to nasal cannula oxygen ?Cardiovascular status: stable and blood pressure returned to baseline ?Postop Assessment: no apparent nausea or vomiting ?Anesthetic complications: no ? ? ?No notable events documented. ? ?Last Vitals:  ?Vitals:  ? 08/16/21 1302 08/16/21 1312  ?BP: 102/65 113/64  ?Pulse: (!) 102 99  ?Resp: 18 17  ?Temp:    ?SpO2: 94% 95%  ?  ?Last Pain:  ?Vitals:  ? 08/16/21 1312  ?TempSrc:   ?PainSc: 0-No pain  ? ? ?  ?  ?  ?  ?  ?  ? ?Effie Berkshire ? ? ? ? ?

## 2021-08-16 NOTE — Interval H&P Note (Signed)
History and Physical Interval Note: ? ?08/16/2021 ?12:00 PM ? ?Brittany Mullins  has presented today for surgery, with the diagnosis of Acute onset of abdominal pain, nausea, vomiting, coffee ground emesis.  Patient has decades long history of abdominal pain, nausea vomiting but has not been active for several months..  The various methods of treatment have been discussed with the patient and family. After consideration of risks, benefits and other options for treatment, the patient has consented to  Procedure(s): ?ESOPHAGOGASTRODUODENOSCOPY (EGD) WITH PROPOFOL (N/A) as a surgical intervention.  The patient's history has been reviewed, patient examined, no change in status, stable for surgery.  I have reviewed the patient's chart and labs.  Questions were answered to the patient's satisfaction.   ? ? ?[name] V [name] ? ? ?

## 2021-08-16 NOTE — Progress Notes (Signed)
?   08/16/21 1011  ?Assess: MEWS Score  ?Level of Consciousness Alert  ?Assess: MEWS Score  ?MEWS Temp 0  ?MEWS Systolic 2  ?MEWS Pulse 0  ?MEWS RR 1  ?MEWS LOC 0  ?MEWS Score 3  ?MEWS Score Color Yellow  ?Assess: if the MEWS score is Yellow or Red  ?Were vital signs taken at a resting state? Yes  ?Focused Assessment No change from prior assessment  ?Early Detection of Sepsis Score *See Row Information* Low  ?MEWS guidelines implemented *See Row Information* Yes  ?Take Vital Signs  ?Increase Vital Sign Frequency  Yellow: Q 2hr X 2 then Q 4hr X 2, if remains yellow, continue Q 4hrs  ?Escalate  ?MEWS: Escalate Yellow: discuss with charge nurse/RN and consider discussing with provider and RRT  ?Notify: Charge Nurse/RN  ?Name of Charge Nurse/RN Notified Amrah,RN  ?Date Charge Nurse/RN Notified 08/16/21  ?Time Charge Nurse/RN Notified 1011  ?Notify: Provider  ?Provider Name/Title Raiford Noble  ?Date Provider Notified 08/16/21  ?Time Provider Notified 1011  ?Method of Notification Rounds  ?Notification Reason Other (Comment)  ?Provider response See new orders  ?Date of Provider Response 08/16/21  ?Time of Provider Response 1011  ? ? ?

## 2021-08-16 NOTE — Progress Notes (Signed)
?PROGRESS NOTE ? ? ? ?Kieran [id number]  MBW:[id number] DOB: 1980-11-17 DOA: 08/15/2021 ?PCP: Julian Hy, PA-C  ? ?Brief Narrative:  ?The patient is [name] with a past medical history significant for but limited to history of cyclical vomiting, history of chronic pain syndrome on methadone, seizure disorder, and THC use as well as other comorbidities including a pituitary tumor and PCOS and Addison's disease who presented with intractable nausea, vomiting and abdominal pain.  She takes methadone and not able to keep it down.  She states that she woke up sick a few days ago and this was the first time this happened to her.  She denies any diarrhea but she is described abdominal pain is midepigastric.  She states that she had some emesis that was coffee-ground in appearance and had 3-4 bloody episodes of this.  She indicated that she had a bleeding ulcer in childhood and has never seen a GI doctor afterwards.  She denies any bloody bowel movements and denies any NSAIDs or Goody powders.  In the ED she is found to have an AKI and she was having cyclical vomiting and difficulty tolerating p.o.  She is given IV fluid resuscitation and given 3 L of normal saline boluses and then now placed on maintenance IV fluids.  Given that her continued nausea and vomiting and concern for coffee-ground emesis GI has been consulted for further evaluation and recommendations.  ? ?Assessment and Plan: ? ?Intractable Nausea/Vomiting associated with Abdominal Pain ?Coffee Ground Emesis  ?-Patient presenting with recurrent n/v and abdominal pain ?-Has reported h/o Addison's disease but is not taking medications for this  ?-Has chronic pain, on methadone - withdrawal is less likely with methadone since it is so long-acting but this is a consideration ?-May be related to cannabinoid hyperemesis syndrome, as she is continuing to use ?-She does have a Hx of Bleeding Gastric Ulcer as a child  ?-CT Renal Stone study  showed "Multiple tiny 2-3 mm nonobstructive calculi are noted within the collecting systems of both kidneys. No additional calculi are noted along the course of either ureter or within  the lumen of the urinary bladder. No hydroureteronephrosis. Unenhanced appearance of the kidneys, bilateral adrenal glands and urinary bladder is otherwise unremarkable."CT also showed "Unenhanced appearance of the stomach is normal. No ?pathologic dilatation of small bowel or colon. The appendix is not ?confidently identified and may be surgically absent. Regardless, ?there are no inflammatory changes noted adjacent to the cecum to ?suggest the presence of an acute appendicitis at this time." ?-Will admit to telemetry ?-Encourage marijuana cessation and a good bowel regimen as well as behavioral health support  ?-Elevated lactate is thought to be related to nv//dehydration and has trended down with IV fluid resuscitation and went from 2.0 and is now 0.7 ?-Lipase level was normal on admission and AST was mildly elevated but trended down to 36 ?-WBC went from 19.5 is now 11.2 ?-GI has been consulted and planning for an EGD later today.  She is not on a GI protective medications or antinausea rules at home ?-Patient does have a history of chronic idiopathic pancreatitis with biliary stenting many years ago at [facility] but recent CT scan showed no evidence of retained stents and most recent CT scan showed no pancreatic abnormality; at [facility] in 2022 her CT showed prominent biliary and pancreatic ductal dilatation and MRCP was recommended but yet to be performed ?-She continues to have intractable nausea vomiting and she is on Zofran 4  mg p.o./IV every 6 as needed for nausea and also has a Phenergan 25 mg rectal suppository every 6 as needed for refractory nausea vomiting ?-Keep n.p.o. for now given that she continues to have nausea vomiting ?-She is asking for Phenergan through the IV but this is on national back order and is  caustic so we will initiate Compazine 10 mg IV Q6 as needed for nausea vomiting refractory nausea ?-Further care per GI ?  ?AKI ?Metabolic acidosis ?-Patient's BUNs/creatinine on admission was 54/3.19 -> 48/2.32 ?-Likely pre-renal associated with intractable n/v ?-She is given multiple fluid boluses yesterday and then started on maintenance IV fluid hydration with LR which have now stopped and initiated normal saline at 75 mils per hour ?-She does have a slight metabolic acidosis with a CO2 of 19, chloride level of 109, anion gap of 8 ?-Avoid further nephrotoxic medications, contrast dyes, hypotension and dehydration to ensure adequate renal perfusion and renally adjust medications ?-Continue to monitor and trend renal function carefully and repeat CMP in the a.m. ?  ?Chronic pain syndrome ?-Dr. Lorin Mercy has reviewed this patient in the [place] Controlled Substances Reporting System.  There are no listed controlled substances since she obtains her methadone at a pain clinic. ?-Will resume home methadone at 120 mg p.o. daily ?-Oxycodone 5 mg p.o. every 4 as needed for moderate pain  ?-IV morphine 2 mg every 2 as needed severe pain as needed will be changed to every 4 as needed for breakthrough pain; with no plan for further escalation at this time ?-Phenergan given PR for breakthrough n/v ?-UDS was checked and was positive for THC ?  ?Hypocalcemia ?-Calcium is now 8.2 today and for correction of albumin is now 9.2 ?-Given 1 g  IV calcium gluconate x1 ? ?Elevated Blood Pressure/Accelerated Hypertension ?-In the setting of intractable nausea vomiting ?-Initiated hydralazine but increase to 10 mg q. for as needed for systolic blood pressure greater than 956 or diastolic blood pressure 213 ?-Continue monitor blood pressures per protocol ?-Last blood pressure reading was ? ?Hyponatremia ?-In the setting of dehydration and nausea vomiting ?-Improved with IV fluid hydration and sodium went from 132 is now 136 ?-Continue monitor and  trend and repeat CMP in the a.m. ? ?Hyperbilirubinemia ?-Likely reactive ?-T. bili is trending downward from 1.6 in the 1.4 ?-Continue monitor and trend and repeat CMP in the a.m. ? ?Hypokalemia ?-Mild and potassium is now 3.3 ?-Replete with IV KCl 40 mill colons x1 ?-Continue monitor and replete as necessary ?-Repeat CMP in a.m. ? ?History of pituitary tumor ?-He had a CT of the head back in July 2021 showed a partially empty sella and sinus wall thickening ? ?Addison's disease ?-Not on any corticosteroids at home ? ?Hypoalbuminemia ?-Patient's albumin level is 2.8 ?-Continue monitor and trend and repeat CMP in the AM  ?  ? ?DVT prophylaxis: enoxaparin (LOVENOX) injection 30 mg Start: 08/15/21 1200 ? ?  Code Status: Full Code ?Family Communication: No family currently at bedside ? ?Disposition Plan:  ?Level of care: Telemetry Medical ?Status is: Inpatient ?Remains inpatient appropriate because: Given that she has intractable nausea vomiting and needs oral intake prior to safe discharge disposition ?  ?Consultants:  ?Gastroenterology ? ?Procedures:  ?None ? ?Antimicrobials:  ?Anti-infectives (From admission, onward)  ? ? Start     Dose/Rate Route Frequency Ordered Stop  ? 08/15/21 0545  cefTRIAXone (ROCEPHIN) 1 g in sodium chloride 0.9 % 100 mL IVPB       ? 1 g ?200 mL/hr  over 30 Minutes Intravenous  Once 08/15/21 0535 08/15/21 0746  ? ?  ?  ?Subjective: ?Seen and examined at bedside and she is continue to have intractable nausea vomiting.  Did not feel well and appears uncomfortable.  Asking for Phenergan through the IV.  States that she has some coffee-ground emesis.  Worried about her blood pressure too given that is elevated.  Does not feel good and continues to have some abdominal discomfort but no diarrhea.  No other concerns or complaints at this time ? ?Objective: ?Vitals:  ? 08/16/21 0459 08/16/21 0729 08/16/21 1010 08/16/21 1028  ?BP: (!) 153/86 (!) 174/98 (!) 208/106 (!) 161/89  ?Pulse: 71 98    ?Resp: 12      ?Temp: 98.1 ?F (36.7 ?C) 97.8 ?F (36.6 ?C)    ?TempSrc: Oral Oral    ?SpO2: 98% 100%    ?Weight:      ?Height:      ? ?No intake or output data in the 24 hours ending 08/16/21 1131 ?Filed Weights  ?

## 2021-08-16 NOTE — Anesthesia Preprocedure Evaluation (Addendum)
Anesthesia Evaluation  ?Patient identified by MRN, date of birth, ID band ?Patient awake ? ? ? ?Reviewed: ?Allergy & Precautions, NPO status , Patient's Chart, lab work & pertinent test results ? ?Airway ?Mallampati: II ? ?TM Distance: >3 FB ?Neck ROM: Full ? ? ? Dental ? ?(+) Dental Advisory Given, Poor Dentition ?  ?Pulmonary ?neg pulmonary ROS,  ?  ?breath sounds clear to auscultation ? ? ? ? ? ? Cardiovascular ?hypertension,  ?Rhythm:Regular Rate:Tachycardia ? ? ?  ?Neuro/Psych ?Pt denies h/o seizure ?negative psych ROS  ? GI/Hepatic ?negative GI ROS, Neg liver ROS,   ?Endo/Other  ?negative endocrine ROS ? Renal/GU ?Renal disease  ? ?  ?Musculoskeletal ?negative musculoskeletal ROS ?(+)  ? Abdominal ?Normal abdominal exam  (+)   ?Peds ? Hematology ?negative hematology ROS ?(+)   ?Anesthesia Other Findings ? ? Reproductive/Obstetrics ?negative OB ROS ? ?  ? ? ? ? ? ? ? ? ? ? ? ? ? ?  ?  ? ? ? ? ? ? ?Anesthesia Physical ?Anesthesia Plan ? ?ASA: 3 ? ?Anesthesia Plan: MAC  ? ?Post-op Pain Management:   ? ?Induction: Intravenous ? ?PONV Risk Score and Plan: 0 and Propofol infusion ? ?Airway Management Planned: Natural Airway and Simple Face Mask ? ?Additional Equipment: None ? ?Intra-op Plan:  ? ?Post-operative Plan:  ? ?Informed Consent: I have reviewed the patients History and Physical, chart, labs and discussed the procedure including the risks, benefits and alternatives for the proposed anesthesia with the patient or authorized representative who has indicated his/her understanding and acceptance.  ? ? ? ? ? ?Plan Discussed with: CRNA ? ?Anesthesia Plan Comments:   ? ? ? ? ? ?Anesthesia Quick Evaluation ? ?

## 2021-08-16 NOTE — Consult Note (Addendum)
Attending physician's note   I have taken a history, reviewed the chart, and examined the patient. I performed a substantive portion of this encounter, including complete performance of at least one of the key components, in conjunction with the APP. I agree with the APP's note, impression, and recommendations with my edits.   41 year old female with medical history as outlined below, to include history of bipolar disorder, Addison's disease, PCOS, seizure disorder, hyperprolactinemia/pituitary tumor, nephrolithiasis, chronic pain, reported chronic pancreatitis, methadone for chronic abdominal pain, marijuana, cholecystectomy, hysterectomy, appendectomy, cesarean, renal stenting, PD stent, presenting with nausea/vomiting, epigastric pain, and reported coffee-ground emesis.  Family history notable for mother deceased age 91 from pancreatic cancer.  She reports nausea/vomiting and epigastric pain started 3 days ago, with coffee-ground emesis 2 days ago.  Reports a history of duodenal ulcer in childhood, but otherwise no known GI bleeding since then.  Inpatient evaluation notable for the following: - BUN/creatinine 40/2.32 (downtrending from 53/3.2) - Protein/albumin 5.2/2.8 - T. bili 1.4 (down from 1.6), ALP 39, AST/ALT 36/25 - WBC 11.2 (down from 19.5) with otherwise normal H/H at 13.4/38.4, MCV 90.6, PLT 179 - Lactate 0.7 - UDS positive for THC - Normal lipase - CT renal protocol: Normal liver and pancreas (noncontrast CT).  Numerous calcified splenic granulomas.  Normal GI tract (again, noncontrast).  Multiple 2-3 mm nonobstructive renal calculi  No significant improvement in abdominal symptoms despite IV Protonix, Zofran, Compazine, Phenergan, IV fluids, hydralazine, morphine.  Was started on Rocephin.  1) Epigastric pain 2) Nausea/Vomiting 3) Coffee-ground emesis - EGD today for expedited  evaluation of PUD, erosive esophagitis, gastritis, obstruction, infectious etiology, etc. - Continue high-dose PPI - Continue antiemetics as prescribed - Certainly could have component of cannabinoid hyperemesis syndrome along with opioid induced gastroparesis (normal GES in 08/2014; none since then)  4) Reported history of chronic idiopathic pancreatitis 5) Chronic abdominal pain - Very limited information available from previous evaluations.  Noncontrast CT on this admission with otherwise normal-appearing pancreas.  CT in 04/2019 and 10/2019 with normal-appearing pancreas, but CT at [facility] in 10/2020 with mild PD dilation up to 5.6 mm - Normal lipase on admission - May eventually require MRCP and referral back to [facility] as outpatient - Currently on outpatient methadone.  Additional pain management per primary service - No reported diarrhea, but could potentially benefit from Creon as outpatient   The indications, risks, and benefits of EGD were explained to the patient in detail. Risks include but are not limited to bleeding, perforation, adverse reaction to medications, and cardiopulmonary compromise. Sequelae include but are not limited to the possibility of surgery, hospitalization, and mortality. The patient verbalized understanding and wished to proceed. All questions answered.    Ann Held, [place] [phone] office                                                                    [facility] Consult: 10:24 AM 08/16/2021  LOS: 1 day    Referring Provider: Dr  Marland Mcalpine  Primary Care Physician:  Luciano Cutter, [facility] [address] Methadone provider: [facility] Primary Gastroenterologist:  unassigned    Reason for Consultation: Nausea vomiting   HPI: Brittany Mullins is a 41 y.o. female.  Past medical history Bipolar disorder.  Pituitary tumor.  Addison's disease.  Medical marijuana patient when living in  [place].  PCOS.  Seizure disorder.  Hyperprolactinemia.  Hearing loss.  Nephrolithiasis.  C. difficile 2014.  Chronic abdominal pain, diarrhea,nausea, vomiting, cyclic nausea vomiting dates back more than 10 years.   Treated w methadone.  Chronic pain idiopathic chronic pancreatitis, notes are from [facility].  Required TPN 2014.  Multiple surgeries: total abdominal hysterectomy, appendectomy, C-section, cholecystectomy, renal stenting, pancreatic stenting.  Details of pancreatic stenting not found in care everywhere but listing of pancreatic stents in 2009, 2012 and stents later removed found in note from 01/08/2014.  Interestingly looking at a few of her previous/multiple CTAP's there is no mention of pancreatic parenchymal changes, calcifications on latest CTs in [place] 2017, [place] 10/2019.  However CTAP at [facility] 10/2020 showed "prominent biliary and pancreatic ductal dilatation, increased from prior.  No obvious stone or mass identified.  MRCP recommended".  I do not find an MRCP.   CT of her head in 10/2019 showed partially empty sella turcica, mucosal thickening in paranasal sinuses but no acute abnormalities.    09/2012 EGD/flex sig.  In [place], [place]. EGD showed retained gastric contents otherwise normal study.  Flex sig normal with presence of formed stool.   Path: gastric fundic type mucosa with no diagnostic alteration, small bowel mucosa with no diagnostic alteration and sigmoid  colonic mucosa with no diagnostic alteration  As of notes from around 6 years ago, providers in [place] state were running out of options for management of her chronic GI symptoms, specifically nausea and vomiting. Smokes a few hits of marijuana every other day.  Not taking any PPI or GI protective medications and does not have antiemetics available at home.  Patient says the last time she had any problems with abdominal pain, nausea or vomiting was about 6 months ago.  Methadone is effective in managing  abdominal pain Presented to [facility] ED yesterday for intractable nausea, nonbloody vomiting.  Sxs present for the last 2 days.  She thought it might have come from chicken salad she had eaten prior to onset symptoms.  However, no one else who ate the same meal got sick. Tachycardic into the 120s.  Hypertensive to 170s systolic. CT renal stone study for flank pain showed multiple nonobstructing calculi in both kidneys.  No ureteral stones.  Calcified granulomas in the spleen.  No pancreatic or hepatic abnormalities.  GB absent.    Initially low sodium at 132, now normal 136.  Potassium initially 3.8 now 3.3.  Magnesium BUNs/creatinine 54/3.1.Marland Kitchen  48/2.3.  GFR 18..  27 T. bili 1.6..  1.4.  Alk phos 66..  39.  AST/ALT 45/38.Marland Kitchen  36/25.  Lipase 22 WBCs 19.5..  11.2.  Lactic acid 2 Hgb 20.4..  13.4.  MCV 90 normal platelets. Urine toxicology positive for THC but not opiates  Meds thus far include single dose of IV Protonix, Zofran, Compazine Phenergan, Rocephin, IV fluids,  IV potassium, hydralazine, morphine.  Chronic methadone has been continued. Patient says she is not getting any relief despite all these medications and feels miserable.  Mother died at age 23 from pancreatic cancer. Patient lives with her boyfriend and children.  She works as an Radio broadcast assistant.  No alcohol.  No cigarettes.   Past Medical History:  Diagnosis Date   Addison's disease [facility])    Brain tumor (benign) (HCC)    Hypertension    Marijuana use  Per pt: "medical marijuana patient"   Pancreatitis    PCOS (polycystic ovarian syndrome)    Pituitary tumor    Seizures (HCC)     Past Surgical History:  Procedure Laterality Date   ABDOMINAL HYSTERECTOMY     APPENDECTOMY     CESAREAN SECTION     CHOLECYSTECTOMY     ELBOW SURGERY     kidney stent     pancreatic stent      Prior to Admission medications   Medication Sig Start Date End Date Taking? Authorizing Provider   levonorgestrel-ethinyl estradiol (ALESSE) 0.1-20 MG-MCG tablet Take 1 tablet by mouth daily.   Yes [provider]  methadone (DOLOPHINE) 10 MG/ML solution Take 120 mg by mouth daily.   Yes [provider]  promethazine (PHENERGAN) 12.5 MG tablet Take 1 tablet (12.5 mg total) by mouth every 8 (eight) hours as needed for nausea or vomiting. 05/25/20  Yes Wallis Bamberg, PA-C  pantoprazole (PROTONIX) 40 MG tablet Take 1 tablet (40 mg total) by mouth 2 (two) times daily before a meal. 06/05/17 12/24/18  Philip Aspen, Limmie Patricia, MD    Scheduled Meds:  enoxaparin (LOVENOX) injection  30 mg Subcutaneous Q24H   methadone  120 mg Oral Daily   sodium chloride flush  3 mL Intravenous Q12H   Infusions:  sodium chloride 75 mL/hr at 08/16/21 1019   potassium chloride     PRN Meds: acetaminophen **OR** acetaminophen, bisacodyl, hydrALAZINE, morphine injection, ondansetron **OR** ondansetron (ZOFRAN) IV, oxyCODONE, polyethylene glycol, prochlorperazine, promethazine   Allergies as of 08/15/2021 - Review Complete 08/15/2021  Allergen Reaction Noted   Bee venom Anaphylaxis 06/02/2017   Toradol [ketorolac tromethamine] Shortness Of Breath 06/02/2017   Benadryl [diphenhydramine] Other (See Comments) 06/03/2017    Family History  Problem Relation Age of Onset   Cancer Mother    Hypertension Mother    Diabetes Maternal Grandmother    Diabetes Maternal Grandfather     Social History   Socioeconomic History   Marital status: Single    Spouse name: Not on file   Number of children: 4   Years of education: Not on file   Highest education level: Not on file  Occupational History   Occupation: Astronomer  Tobacco Use   Smoking status: Never   Smokeless tobacco: Never  Vaping Use   Vaping Use: Never used  Substance and Sexual Activity   Alcohol use: No   Drug use: Yes    Types: Marijuana    Comment: every other day use   Sexual activity: Yes  Other Topics Concern    Not on file  Social History Narrative   Not on file   Social Determinants of Health   Financial Resource Strain: Not on file  Food Insecurity: Not on file  Transportation Needs: Not on file  Physical Activity: Not on file  Stress: Not on file  Social Connections: Not on file  Intimate Partner Violence: Not on file    REVIEW OF SYSTEMS: Constitutional: Feeling tired ENT:  No nose bleeds Pulm: Trouble breathing.  No cough CV:  No palpitations, no LE edema.  No angina GU:  No hematuria, no frequency GI: See HPI. Heme: No excessive or unusual bleeding or bruising. Transfusions: None. Neuro:  No headaches, no peripheral tingling or numbness.  No syncope, no seizures Derm:  No itching, no rash or sores.  Endocrine:  No sweats or chills.  No polyuria or dysuria Immunization: Not queried. Travel: Not queried.   PHYSICAL EXAM:  Vital signs in last 24 hours: Vitals:   08/16/21 0729 08/16/21 1010  BP: (!) 174/98 (!) 208/106  Pulse: 98   Resp:    Temp: 97.8 F (36.6 C)   SpO2: 100%    Wt Readings from Last 3 Encounters:  08/15/21 63.5 kg  05/16/20 72 kg  10/19/19 65.8 kg    General: Patient looks uncomfortable and somewhat ill but not toxic. Head: No facial asymmetry or swelling.  No signs of head trauma. Eyes: No scleral icterus, no conjunctival pallor.  EOMI Ears: No hearing deficit. Nose: No congestion or discharge Mouth: Good dentition.  Mucosa is moist, pink, clear.  Tongue midline, piercing stud present.. Neck: No masses, no thyromegaly Lungs: Clear bilaterally.  No labored breathing or cough Heart: Tacky, regular.  No MRG.  S1, S2 present. Abdomen: Soft.  No distention.  Diffusely tender to even light pressure.  No guarding or rebound.  Bowel sounds present..   Rectal: Deferred Musc/Skeltl: No joint redness, swelling or gross deformity Extremities: CCE Neurologic: Alert.  Oriented x3.  Moves all 4 limbs.  No tremors. Skin: No rash, no sores.   Tattoos:  Professional quality tattoo on right upper arm Nodes: No cervical Psych: Somewhat distracted and agitated due to pain but cooperative.  Intake/Output from previous day: 05/16 0701 - 05/17 0700 In: 1000 [IV Piggyback:1000] Out: -  Intake/Output this shift: No intake/output data recorded.  LAB RESULTS: Recent Labs    08/15/21 0413 08/15/21 0425 08/16/21 0046  WBC 19.5*  --  11.2*  HGB 20.1* 20.4* 13.4  HCT 57.2* 60.0* 38.4  PLT 276  --  179   BMET Lab Results  Component Value Date   NA 136 08/16/2021   NA 132 (L) 08/15/2021   NA 133 (L) 08/15/2021   K 3.3 (L) 08/16/2021   K 3.5 08/15/2021   K 3.8 08/15/2021   CL 109 08/16/2021   CL 102 08/15/2021   CL 96 (L) 08/15/2021   CO2 19 (L) 08/16/2021   CO2 16 (L) 08/15/2021   CO2 23 10/19/2020   GLUCOSE 64 (L) 08/16/2021   GLUCOSE 114 (H) 08/15/2021   GLUCOSE 115 (H) 08/15/2021   BUN 48 (H) 08/16/2021   BUN 53 (H) 08/15/2021   BUN 54 (H) 08/15/2021   CREATININE 2.32 (H) 08/16/2021   CREATININE 3.20 (H) 08/15/2021   CREATININE 3.19 (H) 08/15/2021   CALCIUM 8.2 (L) 08/16/2021   CALCIUM 9.5 08/15/2021   CALCIUM 9.1 10/19/2020   LFT Recent Labs    08/15/21 0413 08/16/21 0046  PROT 8.0 5.2*  ALBUMIN 4.2 2.8*  AST 45* 36  ALT 38 25  ALKPHOS 66 39  BILITOT 1.6* 1.4*   PT/INR Lab Results  Component Value Date   INR 0.97 12/21/2017   Hepatitis Panel No results for input(s): HEPBSAG, HCVAB, HEPAIGM, HEPBIGM in the last 72 hours. C-Diff No components found for: CDIFF Lipase     Component Value Date/Time   LIPASE 22 08/15/2021 0413    Drugs of Abuse     Component Value Date/Time   LABOPIA NONE DETECTED 08/15/2021 1910   COCAINSCRNUR NONE DETECTED 08/15/2021 1910   LABBENZ NONE DETECTED 08/15/2021 1910   AMPHETMU NONE DETECTED 08/15/2021 1910   THCU POSITIVE (A) 08/15/2021 1910   LABBARB NONE DETECTED 08/15/2021 1910     RADIOLOGY STUDIES: CT Renal Stone Study  Result Date: 08/15/2021 CLINICAL DATA:   41 year old female with history of flank pain. EXAM: CT ABDOMEN AND PELVIS WITHOUT CONTRAST TECHNIQUE: Multidetector  CT imaging of the abdomen and pelvis was performed following the standard protocol without IV contrast. RADIATION DOSE REDUCTION: This exam was performed according to the departmental dose-optimization program which includes automated exposure control, adjustment of the mA and/or kV according to patient size and/or use of iterative reconstruction technique. COMPARISON:  CT the abdomen and pelvis 10/25/2019. FINDINGS: Lower chest: Unremarkable. Hepatobiliary: No definite suspicious cystic or solid hepatic lesions are confidently identified on today's noncontrast CT examination. Status post cholecystectomy. Pancreas: No definite pancreatic mass or peripancreatic fluid collections or inflammatory changes are noted on today's noncontrast CT examination. Spleen: Numerous calcified granulomas are noted in the spleen. Otherwise, unremarkable. Adrenals/Urinary Tract: Multiple tiny 2-3 mm nonobstructive calculi are noted within the collecting systems of both kidneys. No additional calculi are noted along the course of either ureter or within the lumen of the urinary bladder. No hydroureteronephrosis. Unenhanced appearance of the kidneys, bilateral adrenal glands and urinary bladder is otherwise unremarkable. Stomach/Bowel: Unenhanced appearance of the stomach is normal. No pathologic dilatation of small bowel or colon. The appendix is not confidently identified and may be surgically absent. Regardless, there are no inflammatory changes noted adjacent to the cecum to suggest the presence of an acute appendicitis at this time. Vascular/Lymphatic: No atherosclerotic calcifications are noted in the abdominal aorta or major pelvic vasculature. No lymphadenopathy noted in the abdomen or pelvis. Reproductive: Status post hysterectomy. Ovaries are unremarkable in appearance. Other: No significant volume of ascites.   No pneumoperitoneum. Musculoskeletal: There are no aggressive appearing lytic or blastic lesions noted in the visualized portions of the skeleton. IMPRESSION: 1. No acute findings are noted in the abdomen or pelvis to account for the patient's symptoms. 2. Multiple 2-3 mm nonobstructive calculi are noted within the collecting systems of both kidneys. No ureteral stones or findings of urinary tract obstruction are noted at this time. 3. Additional incidental findings, as above. Electronically Signed   By: Trudie Reed M.D.   On: 08/15/2021 05:45      IMPRESSION:   Acute abdominal pain, nausea, vomiting with coffee-ground emesis.  Longstanding history of same though it has been quiescent for at least 6 months.  Not on any GI protective medication or antinausea ills at home.  Uses marijuana products and on chronic methadone.  No interruption in methadone use.  History chronic idiopathic pancreatitis with biliary stenting many years ago at [facility].  Details not available.  Recent CT scans show no evidence for retained stents and most CT scans show no pancreatic abnormality.  However CT at [facility] in August 2022 shows prominent biliary and pancreatic ductal dilatation.  An MRCP was recommended but yet to be performed.  History pituitary tumor, Addison's disease.  CT of head 10/2019 showed partially empty sella and sinus wall thickening.   Not on corticosteroid at home.   PLAN:     EGD planned today.  Need to repeat advanced imaging such as CT or MRI?   Jennye Moccasin  08/16/2021, 10:24 AM Phone [phone]

## 2021-08-16 NOTE — H&P (View-Only) (Signed)
? ? Attending physician's note  ? ?I have taken a history, reviewed the chart, and examined the patient. I performed a substantive portion of this encounter, including complete performance of at least one of the key components, in conjunction with the APP. I agree with the APP's note, impression, and recommendations with my edits.  ? ?41 year old female with medical history as outlined below, to include history of bipolar disorder, Addison's disease, PCOS, seizure disorder, hyperprolactinemia/pituitary tumor, nephrolithiasis, chronic pain, reported chronic pancreatitis, methadone for chronic abdominal pain, marijuana, cholecystectomy, hysterectomy, appendectomy, cesarean, renal stenting, PD stent, presenting with nausea/vomiting, epigastric pain, and reported coffee-ground emesis. ? ?Family history notable for mother deceased age 75 from pancreatic cancer. ? ?She reports nausea/vomiting and epigastric pain started 3 days ago, with coffee-ground emesis 2 days ago.  Reports a history of duodenal ulcer in childhood, but otherwise no known GI bleeding since then. ? ?Inpatient evaluation notable for the following: ?- BUN/creatinine 40/2.32 (downtrending from 53/3.2) ?- Protein/albumin 5.2/2.8 ?- T. bili 1.4 (down from 1.6), ALP 39, AST/ALT 36/25 ?- WBC 11.2 (down from 19.5) with otherwise normal H/H at 13.4/38.4, MCV 90.6, PLT 179 ?- Lactate 0.7 ?- UDS positive for THC ?- Normal lipase ?- CT renal protocol: Normal liver and pancreas (noncontrast CT).  Numerous calcified splenic granulomas.  Normal GI tract (again, noncontrast).  Multiple 2-3 mm nonobstructive renal calculi ? ?No significant improvement in abdominal symptoms despite IV Protonix, Zofran, Compazine, Phenergan, IV fluids, hydralazine, morphine.  Was started on Rocephin. ? ?1) Epigastric pain ?2) Nausea/Vomiting ?3) Coffee-ground emesis ?- EGD today for expedited  evaluation of PUD, erosive esophagitis, gastritis, obstruction, infectious etiology, etc. ?- Continue high-dose PPI ?- Continue antiemetics as prescribed ?- Certainly could have component of cannabinoid hyperemesis syndrome along with opioid induced gastroparesis (normal GES in 08/2014; none since then) ? ?4) Reported history of chronic idiopathic pancreatitis ?5) Chronic abdominal pain ?- Very limited information available from previous evaluations.  Noncontrast CT on this admission with otherwise normal-appearing pancreas.  CT in 04/2019 and 10/2019 with normal-appearing pancreas, but CT at [facility] in 10/2020 with mild PD dilation up to 5.6 mm ?- Normal lipase on admission ?- May eventually require MRCP and referral back to [facility] as outpatient ?- Currently on outpatient methadone.  Additional pain management per primary service ?- No reported diarrhea, but could potentially benefit from [facility] as outpatient ? ? ?The indications, risks, and benefits of EGD were explained to the patient in detail. Risks include but are not limited to bleeding, perforation, adverse reaction to medications, and cardiopulmonary compromise. Sequelae include but are not limited to the possibility of surgery, hospitalization, and mortality. The patient verbalized understanding and wished to proceed. All questions answered. ? ? ? ?[address], DO, FACG ?[phone] office  ? ?   ? ? ? ?                                                           [facility] Consult: ?10:24 AM ?08/16/2021 ? LOS: 1 day  ? ? ?Referring Provider: Dr  Alfredia Ferguson  ?Primary Care Physician:  Julian Hy, [facility] [address] ?Methadone provider: [facility] ?Primary Gastroenterologist:  unassigned ? ? ? ?Reason for Consultation: Nausea vomiting ?  ?HPI: Brittany Mullins is a 41 y.o. female.  Past medical history Bipolar disorder.  Pituitary tumor.  Addison's disease.  Medical marijuana patient when living in  [place] state.  PCOS.  Seizure disorder.  Hyperprolactinemia.  Hearing loss.  Nephrolithiasis.  C. difficile 2014.  Chronic abdominal pain, diarrhea,nausea, vomiting, cyclic nausea vomiting dates back more than 10 years.   Treated w methadone.  Chronic pain idiopathic chronic pancreatitis, notes are from [facility].  Required TPN 2014.  Multiple surgeries: total abdominal hysterectomy, appendectomy, C-section, cholecystectomy, renal stenting, pancreatic stenting.  Details of pancreatic stenting not found in care everywhere but listing of pancreatic stents in 2009, 2012 and stents later removed found in note from 01/08/2014.  Interestingly looking at a few of her previous/multiple CTAP's there is no mention of pancreatic parenchymal changes, calcifications on latest CTs in [place] 2017, [place] 10/2019.  However CTAP at [facility] 10/2020 showed "prominent biliary and pancreatic ductal dilatation, increased from prior.  No obvious stone or mass identified.  MRCP recommended".  I do not find an MRCP.   ?CT of her head in 10/2019 showed partially empty sella turcica, mucosal thickening in paranasal sinuses but no acute abnormalities.  ?  ?09/2012 EGD/flex sig.  In [place], [place]. EGD showed retained gastric contents otherwise normal study.  Flex sig normal with presence of formed stool.   Path: gastric fundic type mucosa with no diagnostic alteration, small bowel mucosa with no diagnostic alteration and sigmoid  colonic mucosa with no diagnostic alteration ? ?As of notes from around 6 years ago, providers in [place] state were running out of options for management of her chronic GI symptoms, specifically nausea and vomiting. ?Smokes a few hits of marijuana every other day.  Not taking any PPI or GI protective medications and does not have antiemetics available at home. ? ?Patient says the last time she had any problems with abdominal pain, nausea or vomiting was about 6 months ago.  Methadone is effective in managing  abdominal pain ?Presented to [facility] ED yesterday for intractable nausea, nonbloody vomiting.  Sxs present for the last 2 days.  She thought it might have come from chicken salad she had eaten prior to onset symptoms.  However, no one else who ate the same meal got sick. ?Tachycardic into the 120s.  Hypertensive to 606T systolic. ?CT renal stone study for flank pain showed multiple nonobstructing calculi in both kidneys.  No ureteral stones.  Calcified granulomas in the spleen.  No pancreatic or hepatic abnormalities.  GB absent.   ? ?Initially low sodium at 132, now normal 136.  Potassium initially 3.8 now 3.3.  Magnesium BUNs/creatinine 54/3.1.Marland Kitchen  48/2.3.  GFR 18..  27 ?T. bili 1.6..  1.4.  Alk phos 66..  39.  AST/ALT 45/38.Marland Kitchen  36/25.  Lipase 22 ?WBCs 19.5..  11.2.  Lactic acid 2 ?Hgb 20.4..  13.4.  MCV 90 normal platelets. ?Urine toxicology positive for THC but not opiates ? ?Meds thus far include single dose of IV Protonix, Zofran, Compazine Phenergan, Rocephin, IV fluids,  IV potassium, hydralazine, morphine.  Chronic methadone has been continued. ?Patient says she is not getting any relief despite all these medications and feels miserable. ? ?Mother died at age 59 from pancreatic cancer. ?Patient lives with her boyfriend and children.  She works as an Social worker.  No alcohol.  No cigarettes. ? ? ?Past Medical History:  ?Diagnosis Date  ? Addison's disease ([place])   ? Brain tumor (benign) ([place])   ? Hypertension   ? Marijuana use   ?  Per pt: "medical marijuana patient"  ? Pancreatitis   ? PCOS (polycystic ovarian syndrome)   ? Pituitary tumor   ? Seizures ([place])   ? ? ?Past Surgical History:  ?Procedure Laterality Date  ? ABDOMINAL HYSTERECTOMY    ? APPENDECTOMY    ? CESAREAN SECTION    ? CHOLECYSTECTOMY    ? ELBOW SURGERY    ? kidney stent    ? pancreatic stent    ? ? ?Prior to Admission medications   ?Medication Sig Start Date End Date Taking? Authorizing Provider   ?levonorgestrel-ethinyl estradiol (ALESSE) 0.1-20 MG-MCG tablet Take 1 tablet by mouth daily.   Yes [provider]  ?methadone (DOLOPHINE) 10 MG/ML solution Take 120 mg by mouth daily.   Yes [provider]

## 2021-08-16 NOTE — Transfer of Care (Signed)
Immediate Anesthesia Transfer of Care Note ? ?Patient: Brittany Mullins ? ?Procedure(s) Performed: ESOPHAGOGASTRODUODENOSCOPY (EGD) WITH PROPOFOL ?BIOPSY ? ?Patient Location: Endoscopy Unit ? ?Anesthesia Type:MAC ? ?Level of Consciousness: drowsy, patient cooperative and responds to stimulation ? ?Airway & Oxygen Therapy: Patient Spontanous Breathing and Patient connected to nasal cannula oxygen ? ?Post-op Assessment: Report given to RN and Post -op Vital signs reviewed and stable ? ?Post vital signs: Reviewed and stable ? ?Last Vitals:  ?Vitals Value Taken Time  ?BP    ?Temp    ?Pulse    ?Resp    ?SpO2    ? ? ?Last Pain:  ?Vitals:  ? 08/16/21 1143  ?TempSrc: Temporal  ?PainSc: 8   ?   ? ?  ? ?Complications: No notable events documented. ?

## 2021-08-16 NOTE — Progress Notes (Addendum)
Initial Nutrition Assessment ? ?DOCUMENTATION CODES:  ? ?Not applicable ? ?INTERVENTION:  ? ?Boost Breeze po TID, each supplement provides 250 kcal and 9 grams of protein ? ?NUTRITION DIAGNOSIS:  ? ?Inadequate oral intake related to nausea as evidenced by per patient/family report. ? ?GOAL:  ? ?Patient will meet greater than or equal to 90% of their needs ? ?MONITOR:  ? ?PO intake, Supplement acceptance, Diet advancement ? ?REASON FOR ASSESSMENT:  ? ?Consult ?Other (Comment) (nutritional goals) ? ?ASSESSMENT:  ? ?41 yo female admitted with N/V, AKI. PMH includes cyclical vomiting, chronic pain on methadone, seizures, THC use. ? ?S/P EGD today which revealed moderate gastritis. Protonix and Reglan added. Started on clear liquid diet.  ? ?Patient states that she has been having nausea for the past few years and has been eating poorly at home r/t nausea. She is currently nauseous. Clear liquid meal tray at bedside. Patient only took a few sips before becoming nauseous. Usual weight 140-145 lbs and she is unsure if she has lost any weight. Current weight documented as 140 lbs (likely a stated weight). No recent weight history available. Will add clear liquid supplement for patient to try in order to maximize protein and calorie intake.  ? ?Labs reviewed. K 3.3 ?Medications reviewed and include methadone, Reglan, Protonix. ?IVF: NS at 75 ml/h ? ?NUTRITION - FOCUSED PHYSICAL EXAM: ? ?Flowsheet Row Most Recent Value  ?Orbital Region No depletion  ?Upper Arm Region No depletion  ?Thoracic and Lumbar Region No depletion  ?Buccal Region No depletion  ?Temple Region Mild depletion  ?Clavicle Bone Region Mild depletion  ?Clavicle and Acromion Bone Region No depletion  ?Scapular Bone Region No depletion  ?Dorsal Hand Mild depletion  ?Patellar Region Mild depletion  ?Anterior Thigh Region Mild depletion  ?Posterior Calf Region Mild depletion  ?Edema (RD Assessment) None  ?Hair Reviewed  ?Eyes Reviewed  ?Mouth Reviewed  ?Skin  Reviewed  ?Nails Reviewed  ? ?  ? ? ?Diet Order:   ?Diet Order   ? ?       ?  Diet clear liquid Room service appropriate? Yes; Fluid consistency: Thin  Diet effective now       ?  ? ?  ?  ? ?  ? ? ?EDUCATION NEEDS:  ? ?No education needs have been identified at this time ? ?Skin:  Skin Assessment: Reviewed RN Assessment ? ?Last BM:  no BM documented ? ?Height:  ? ?Ht Readings from Last 1 Encounters:  ?08/15/21 '5\' 3"'$  (1.6 m)  ? ? ?Weight:  ? ?Wt Readings from Last 1 Encounters:  ?08/16/21 63.5 kg  ? ? ?Ideal Body Weight:  52.3 kg ? ?BMI:  Body mass index is 24.8 kg/m?. ? ?Estimated Nutritional Needs:  ? ?Kcal:  1800-2000 ? ?Protein:  80-95 gm ? ?Fluid:  1.8-2 L ? ? ? ?Lucas Mallow RD, LDN, CNSC ?Please refer to Amion for contact information.                                                       ? ?

## 2021-08-17 ENCOUNTER — Telehealth: Payer: Self-pay

## 2021-08-17 DIAGNOSIS — F1120 Opioid dependence, uncomplicated: Secondary | ICD-10-CM | POA: Diagnosis not present

## 2021-08-17 DIAGNOSIS — R1013 Epigastric pain: Secondary | ICD-10-CM

## 2021-08-17 DIAGNOSIS — K2970 Gastritis, unspecified, without bleeding: Secondary | ICD-10-CM

## 2021-08-17 DIAGNOSIS — N179 Acute kidney failure, unspecified: Secondary | ICD-10-CM | POA: Diagnosis not present

## 2021-08-17 DIAGNOSIS — F1290 Cannabis use, unspecified, uncomplicated: Secondary | ICD-10-CM | POA: Diagnosis not present

## 2021-08-17 DIAGNOSIS — K2990 Gastroduodenitis, unspecified, without bleeding: Secondary | ICD-10-CM

## 2021-08-17 DIAGNOSIS — R112 Nausea with vomiting, unspecified: Secondary | ICD-10-CM | POA: Diagnosis not present

## 2021-08-17 LAB — CBC WITH DIFFERENTIAL/PLATELET
Abs Immature Granulocytes: 0.03 10*3/uL (ref 0.00–0.07)
Basophils Absolute: 0 10*3/uL (ref 0.0–0.1)
Basophils Relative: 0 %
Eosinophils Absolute: 0 10*3/uL (ref 0.0–0.5)
Eosinophils Relative: 0 %
HCT: 40.6 % (ref 36.0–46.0)
Hemoglobin: 13.5 g/dL (ref 12.0–15.0)
Immature Granulocytes: 0 %
Lymphocytes Relative: 30 %
Lymphs Abs: 2.5 10*3/uL (ref 0.7–4.0)
MCH: 30.8 pg (ref 26.0–34.0)
MCHC: 33.3 g/dL (ref 30.0–36.0)
MCV: 92.7 fL (ref 80.0–100.0)
Monocytes Absolute: 0.9 10*3/uL (ref 0.1–1.0)
Monocytes Relative: 11 %
Neutro Abs: 4.9 10*3/uL (ref 1.7–7.7)
Neutrophils Relative %: 59 %
Platelets: 173 10*3/uL (ref 150–400)
RBC: 4.38 MIL/uL (ref 3.87–5.11)
RDW: 12.9 % (ref 11.5–15.5)
WBC: 8.3 10*3/uL (ref 4.0–10.5)
nRBC: 0 % (ref 0.0–0.2)

## 2021-08-17 LAB — COMPREHENSIVE METABOLIC PANEL
ALT: 19 U/L (ref 0–44)
AST: 32 U/L (ref 15–41)
Albumin: 2.8 g/dL — ABNORMAL LOW (ref 3.5–5.0)
Alkaline Phosphatase: 38 U/L (ref 38–126)
Anion gap: 6 (ref 5–15)
BUN: 25 mg/dL — ABNORMAL HIGH (ref 6–20)
CO2: 23 mmol/L (ref 22–32)
Calcium: 8.2 mg/dL — ABNORMAL LOW (ref 8.9–10.3)
Chloride: 107 mmol/L (ref 98–111)
Creatinine, Ser: 1.38 mg/dL — ABNORMAL HIGH (ref 0.44–1.00)
GFR, Estimated: 50 mL/min — ABNORMAL LOW (ref >60)
Glucose, Bld: 75 mg/dL (ref 70–99)
Potassium: 3.7 mmol/L (ref 3.5–5.1)
Sodium: 136 mmol/L (ref 135–145)
Total Bilirubin: 1.3 mg/dL — ABNORMAL HIGH (ref 0.3–1.2)
Total Protein: 5.2 g/dL — ABNORMAL LOW (ref 6.5–8.1)

## 2021-08-17 LAB — PHOSPHORUS: Phosphorus: 2.8 mg/dL (ref 2.5–4.6)

## 2021-08-17 LAB — CALCIUM, IONIZED: Calcium, Ionized, Serum: 4.6 mg/dL (ref 4.5–5.6)

## 2021-08-17 LAB — MAGNESIUM: Magnesium: 1.9 mg/dL (ref 1.7–2.4)

## 2021-08-17 MED ORDER — ENOXAPARIN SODIUM 40 MG/0.4ML IJ SOSY
40.0000 mg | PREFILLED_SYRINGE | INTRAMUSCULAR | Status: DC
Start: 2021-08-18 — End: 2021-08-21
  Filled 2021-08-17: qty 0.4

## 2021-08-17 MED ORDER — LEVONORGESTREL-ETHINYL ESTRAD 0.1-20 MG-MCG PO TABS
1.0000 | ORAL_TABLET | Freq: Every day | ORAL | Status: DC
  Administered 2021-08-17 – 2021-08-21 (×5): 1 via ORAL
  Filled 2021-08-17: qty 1

## 2021-08-17 MED ORDER — BISACODYL 5 MG PO TBEC
5.0000 mg | DELAYED_RELEASE_TABLET | Freq: Two times a day (BID) | ORAL | Status: DC
  Administered 2021-08-17 (×2): 5 mg via ORAL
  Filled 2021-08-17 (×3): qty 1

## 2021-08-17 NOTE — Progress Notes (Signed)
PROGRESS NOTE    Brittany Mullins  GNF:[id number] DOB: 10-Sep-1980 DOA: 08/15/2021 PCP: Julian Hy, PA-C   Brief Narrative:  The patient is [name] with a past medical history significant for but limited to history of cyclical vomiting, history of chronic pain syndrome on methadone, seizure disorder, and THC use as well as other comorbidities including a pituitary tumor and PCOS and Addison's disease who presented with intractable nausea, vomiting and abdominal pain.  She takes methadone and not able to keep it down.  She states that she woke up sick a few days ago and this was the first time this happened to her.  She denies any diarrhea but she is described abdominal pain is midepigastric.  She states that she had some emesis that was coffee-ground in appearance and had 3-4 bloody episodes of this.  She indicated that she had a bleeding ulcer in childhood and has never seen a GI doctor afterwards.  She denies any bloody bowel movements and denies any NSAIDs or Goody powders.  In the ED she is found to have an AKI and she was having cyclical vomiting and difficulty tolerating p.o.  She is given IV fluid resuscitation and given 3 L of normal saline boluses and then now placed on maintenance IV fluids.  Given that her continued nausea and vomiting and concern for coffee-ground emesis GI has been consulted for further evaluation and recommendations.  GI took the patient for an EGD and it showed moderate gastritis that was biopsied. She was stared on a CLD and placed on Pantoprazole 40 mg po BID x6 weeks sand then 40 mg po Daily afterwards. She was also started on IV Reglan 10 mg q8h x 72 hours. Renal Fxn continues to Improve but still not normal so will likely D/C in the AM. Her Diet will be advanced to Full today and Soft Tomorrow.     Assessment and Plan:  Intractable Nausea/Vomiting associated with Abdominal Pain, improving  Coffee Ground Emesis in the setting of  Gastritis  -Patient presenting with recurrent n/v and abdominal pain -Has reported h/o Addison's disease but is not taking medications for this  -Has chronic pain, on methadone - withdrawal is less likely with methadone since it is so long-acting but this is a consideration -May be related to cannabinoid hyperemesis syndrome, as she is continuing to use -She does have a Hx of Bleeding Gastric Ulcer as a child  -CT Renal Stone study showed "Multiple tiny 2-3 mm nonobstructive calculi are noted within the collecting systems of both kidneys. No additional calculi are noted along the course of either ureter or within  the lumen of the urinary bladder. No hydroureteronephrosis. Unenhanced appearance of the kidneys, bilateral adrenal glands and urinary bladder is otherwise unremarkable."CT also showed "Unenhanced appearance of the stomach is normal. No pathologic dilatation of small bowel or colon. The appendix is not confidently identified and may be surgically absent. Regardless, there are no inflammatory changes noted adjacent to the cecum to suggest the presence of an acute appendicitis at this time." -Admitted to Telemetry  -Encourage marijuana cessation and a good bowel regimen as well as behavioral health support  -Elevated lactate is thought to be related to nv//dehydration and has trended down with IV fluid resuscitation and went from 2.0 and is now 0.7 -Lipase level was normal on admission and AST was mildly elevated but trended down to 36 -WBC went from 19.5 is now 11.2 yesterday and today is 8.3 -GI has been consulted and  planning for an EGD which showed moderate gastritis that was biopsied and a normal esophagus -Patient does have a history of chronic idiopathic pancreatitis with biliary stenting many years ago at [facility] but recent CT scan showed no evidence of retained stents and most recent CT scan showed no pancreatic abnormality; at [facility] in 2022 her CT showed prominent biliary and  pancreatic ductal dilatation and MRCP was recommended but yet to be performed -She continues to have intractable nausea vomiting and she is on Zofran 4 mg p.o./IV every 6 as needed for nausea and also has a Phenergan 25 mg rectal suppository every 6 as needed for refractory nausea vomiting;  GI also added IV Metoclopramide 10 mg q8 x72 hours  -Now GI recommending PPI BID x 6 weeks then po Pantoprazole 40 mg Daily  -On CLD and will go to FULL Liquid Diet  -She is asking for Phenergan through the IV but this is on national back order and is caustic so we will initiate Compazine 10 mg IV Q6 as needed for nausea vomiting refractory nausea -Further care per GI   AKI, improving  Metabolic acidosis -Patient's BUNs/creatinine on admission was 54/3.19 -> 48/2.32 -> 25/1.38 -Likely pre-renal associated with intractable n/v -She is given multiple fluid boluses yesterday and then started on maintenance IV fluid hydration with LR which have now stopped and initiated normal saline at 75 mils per hour and will continue  -She did have a slight metabolic acidosis with a CO2 of 19, chloride level of 109, anion gap of 8; Now CO2 is 23, AG is 6, and Chloride Level is 107 -Avoid further nephrotoxic medications, contrast dyes, hypotension and dehydration to ensure adequate renal perfusion and renally adjust medications -Continue to monitor and trend renal function carefully and repeat CMP in the a.m.   Chronic pain syndrome -Dr. Lorin Mercy has reviewed this patient in the [place] Controlled Substances Reporting System.  There are no listed controlled substances since she obtains her methadone at a pain clinic. -Will resume home methadone at 120 mg p.o. daily -Oxycodone 5 mg p.o. every 4 as needed for moderate pain  -IV morphine 2 mg every 2 as needed severe pain as needed will be changed to every 4 as needed for breakthrough pain; with no plan for further escalation at this time -Phenergan given PR for breakthrough n/v -UDS  was checked and was positive for THC   Hypocalcemia -Calcium is now 8.2 again today and for correction of albumin is now 9.2 -Given 1 g  IV calcium gluconate x1 earlier in hospitalization   Elevated Blood Pressure/Accelerated Hypertension -In the setting of intractable nausea vomiting -Initiated hydralazine but increase to 10 mg q. for as needed for systolic blood pressure greater than 128 or diastolic blood pressure 786 -Continue monitor blood pressures per protocol -Last blood pressure reading was 129/78   Hyponatremia -In the setting of dehydration and nausea vomiting -Improved with IV fluid hydration and sodium went from 132 is now 136 x2 -Continue monitor and trend and repeat CMP in the a.m.   Hyperbilirubinemia -Likely reactive -T. bili is trending downward from 1.6 -> 1.4 -> 1.3 -Continue monitor and trend and repeat CMP in the a.m.   Hypokalemia -Mild and potassium is now 3.3 yesterday and is now 3.7 today  -Replete with IV KCl 40 mEQ x1 -Continue monitor and replete as necessary -Repeat CMP in a.m.   History of pituitary tumor -He had a CT of the head back in July 2021 showed  a partially empty sella and sinus wall thickening   Addison's disease -Not on any corticosteroids at home   Hypoalbuminemia -Patient's albumin level is 2.8 x2 -Continue monitor and trend and repeat CMP in the AM   DVT prophylaxis: enoxaparin (LOVENOX) injection 30 mg Start: 08/15/21 1200    Code Status: Full Code Family Communication: No family present at bedside   Disposition Plan:  Level of care: Telemetry Medical Status is: Inpatient Remains inpatient appropriate because: Diet is being advanced and still has an AKI   Consultants:  Gastroenterology  Procedures:  EGD Findings:      The entire esophagus was normal.      The Z-line was regular and was found 39 cm from the incisors.      Moderate inflammation characterized by congestion (edema) and erythema       was found in the  entire examined stomach. There was small amounts of       scattered heme, but no ulcers or active bleeding noted. Biopsies were       taken with a cold forceps for Helicobacter pylori testing. Estimated       blood loss was minimal. There was some retained food material in the       proximal stomach which decreased some of the visualization in the       proximal gastric body and fundus. This was lavaged with improved       visualization and no additional patholgy noted. The stomach was slightly       J shaped, but the pylorus was patent and easily traversed.      The examined duodenum was normal. Impression:               - Normal esophagus.                           - Z-line regular, 39 cm from the incisors.                           - Moderate gastritis. Biopsied.                           - Normal examined duodenum. Recommendation:           - Return patient to hospital ward for ongoing care.                           - Will restart clear liquids now and can advance                            diet as tolerated.                           - Continue present medications.                           - Use Protonix (pantoprazole) 40 mg PO BID for 6                            weeks to promote mucosal healing, then reduce to 40  mg daily and can titrate off if no additional GI                            symptoms.                           - Reglan 10 mg IV Q8 hours scheduled x72 hours.                            When symptoms improving, can change to PO prn.                           - Continue Zofran and Phenergan prn as prescribed.                           - Await pathology results.                           - Please do not hesitate to contact the inpatient                            GI service with additional questions or concerns.  Antimicrobials:  Anti-infectives (From admission, onward)    Start     Dose/Rate Route Frequency Ordered Stop   08/15/21 0545   cefTRIAXone (ROCEPHIN) 1 g in sodium chloride 0.9 % 100 mL IVPB        1 g 200 mL/hr over 30 Minutes Intravenous  Once 08/15/21 0535 08/15/21 0746       Subjective: Seen and examined at bedside and was doing better today. Abdomen still hurting a little bit but no vomiting. Has a little bit of nausea. Able to tolerate Clears fairly well. No CP or SOB. Happy that she has not vomited today as she did 5x yesterday. No other concerns or complaints at this time.   Objective: Vitals:   08/16/21 1610 08/16/21 2049 08/16/21 2347 08/17/21 0729  BP: 124/67 (!) 119/56 (!) 98/59 129/78  Pulse:  100 85 79  Resp:  '15 16 16  '$ Temp:  98.6 F (37 C)  98.6 F (37 C)  TempSrc:  Oral  Oral  SpO2:  98% 95% 99%  Weight:      Height:        Intake/Output Summary (Last 24 hours) at 08/17/2021 1057 Last data filed at 08/17/2021 0600 Gross per 24 hour  Intake 1122.76 ml  Output --  Net 1122.76 ml   Filed Weights   08/15/21 0339 08/16/21 1143  Weight: 63.5 kg 63.5 kg   Examination: Physical Exam:  Constitutional: WN/WD Caucasian female in NAD appears calmer today  Respiratory: Diminished to auscultation bilaterally, no wheezing, rales, rhonchi or crackles. Normal respiratory effort and patient is not tachypenic. No accessory muscle use. Unlabored breathing  Cardiovascular: RRR, no murmurs / rubs / gallops. S1 and S2 auscultated. No extremity edema. Abdomen: Soft, slightly-tender, non-distended. Bowel sounds positive.  GU: Deferred. Musculoskeletal: No clubbing / cyanosis of digits/nails. No joint deformity upper and lower extremities.  Skin: No rashes, lesions, ulcers. No induration; Warm and dry.  Neurologic: CN 2-12 grossly intact with no focal deficits. Psychiatric: Normal judgment and insight. Alert and oriented x 3. Normal mood and appropriate affect.  Data Reviewed: I have personally reviewed following labs and imaging studies  CBC: Recent Labs  Lab 08/15/21 0413 08/15/21 0425  08/16/21 0046 08/17/21 0241  WBC 19.5*  --  11.2* 8.3  NEUTROABS 17.2*  --   --  4.9  HGB 20.1* 20.4* 13.4 13.5  HCT 57.2* 60.0* 38.4 40.6  MCV 89.1  --  90.6 92.7  PLT 276  --  179 254   Basic Metabolic Panel: Recent Labs  Lab 08/15/21 0413 08/15/21 0425 08/16/21 0046 08/17/21 0241  NA 133* 132* 136 136  K 3.8 3.5 3.3* 3.7  CL 96* 102 109 107  CO2 16*  --  19* 23  GLUCOSE 115* 114* 64* 75  BUN 54* 53* 48* 25*  CREATININE 3.19* 3.20* 2.32* 1.38*  CALCIUM 9.5  --  8.2* 8.2*  MG  --   --  2.0 1.9  PHOS  --   --   --  2.8   GFR: Estimated Creatinine Clearance: 49.1 mL/min (A) (by C-G formula based on SCr of 1.38 mg/dL (H)). Liver Function Tests: Recent Labs  Lab 08/15/21 0413 08/16/21 0046 08/17/21 0241  AST 45* 36 32  ALT 38 25 19  ALKPHOS 66 39 38  BILITOT 1.6* 1.4* 1.3*  PROT 8.0 5.2* 5.2*  ALBUMIN 4.2 2.8* 2.8*   Recent Labs  Lab 08/15/21 0413  LIPASE 22   No results for input(s): AMMONIA in the last 168 hours. Coagulation Profile: No results for input(s): INR, PROTIME in the last 168 hours. Cardiac Enzymes: No results for input(s): CKTOTAL, CKMB, CKMBINDEX, TROPONINI in the last 168 hours. BNP (last 3 results) No results for input(s): PROBNP in the last 8760 hours. HbA1C: No results for input(s): HGBA1C in the last 72 hours. CBG: No results for input(s): GLUCAP in the last 168 hours. Lipid Profile: No results for input(s): CHOL, HDL, LDLCALC, TRIG, CHOLHDL, LDLDIRECT in the last 72 hours. Thyroid Function Tests: No results for input(s): TSH, T4TOTAL, FREET4, T3FREE, THYROIDAB in the last 72 hours. Anemia Panel: No results for input(s): VITAMINB12, FOLATE, FERRITIN, TIBC, IRON, RETICCTPCT in the last 72 hours. Sepsis Labs: Recent Labs  Lab 08/15/21 0819 08/15/21 2105 08/16/21 0046  LATICACIDVEN 2.0* 1.1 0.7    No results found for this or any previous visit (from the past 240 hour(s)).   Radiology Studies: No results found.  Scheduled  Meds:  enoxaparin (LOVENOX) injection  30 mg Subcutaneous Q24H   feeding supplement  1 Container Oral TID BM   levonorgestrel-ethinyl estradiol  1 tablet Oral Daily   methadone  120 mg Oral Daily   metoCLOPramide (REGLAN) injection  10 mg Intravenous Q8H   pantoprazole  40 mg Oral BID   sodium chloride flush  3 mL Intravenous Q12H   Continuous Infusions:  sodium chloride 75 mL/hr at 08/16/21 2210    LOS: 2 days   Raiford Noble, DO [facility] Available via Epic secure chat 7am-7pm After these hours, please refer to coverage provider listed on [email or web address] 08/17/2021, 10:57 AM

## 2021-08-17 NOTE — Telephone Encounter (Signed)
Patient has been scheduled for a 4-week hospital follow up with Dr. Bryan Lemma on Thursday, 09/14/21 at 9:40 am.  Appt information mailed to patient.

## 2021-08-17 NOTE — Telephone Encounter (Signed)
-----   Message from Levin Erp, [place] sent at 08/17/2021 11:03 AM EDT ----- Regarding: Follow-up Patient needs follow-up with Dr. Bryan Lemma in the next 4 to 6 weeks.  Thanks, JL L

## 2021-08-17 NOTE — Progress Notes (Signed)
Mobility Specialist Progress Note    08/17/21 1304  Mobility  Activity Ambulated with assistance in hallway  Level of Assistance Standby assist, set-up cues, supervision of patient - no hands on  Assistive Device Other (Comment) (IV pole)  Distance Ambulated (ft) 130 ft  Activity Response Tolerated well  $Mobility charge 1 Mobility   Pt received in bed and agreeable. C/o stomach pain on walk. Returned to bed with call bell in reach. RN notified.    Hildred Alamin Mobility Specialist  Primary: 5N M.S. Phone: [phone] Secondary: 6N M.S. Phone: [phone]

## 2021-08-17 NOTE — Progress Notes (Addendum)
Attending physician's note   I have taken a history, reviewed the chart, and examined the patient. I performed a substantive portion of this encounter, including complete performance of at least one of the key components, in conjunction with the APP. I agree with the APP's note, impression, and recommendations with my edits.   EGD completed yesterday notable for moderate nonulcer gastritis.  Path pending.  Otherwise, feels much better today with improvement in epigastric pain.  Mild nausea, but no more emesis.  - Continue high-dose PPI as outlined - Continue scheduled Reglan.  Given improvement, suspect we can change from IV to PO formulation, continue scheduled x72 hours, then change to prn - Continue advancing diet as tolerated - Agree with bowel regimen - I recommend that she continue her outpatient follow-up with her GI at [facility] where she was previously seen and given anticipated outpatient GI needs at a larger academic center  - GI service will sign off at this time. Please do not hesitate to contact with additional questions or concerns.    Brittany [place], DO, FACG [phone] office          Progress Note   Subjective  Chief Complaint: Nausea and vomiting  Patient tells me she thinks the Reglan helped overnight as she is just now nauseous but not vomiting anymore.  She is still so nauseous though that even a clear liquid diet does not sound appetizing to her.  She has not tried to eat anything.  Does tell me that she is having some lower abdominal pain now and has not had a bowel movement in over 4 days.   Objective   Vital signs in last 24 hours: Temp:  [98.6 F (37 C)-99 F (37.2 C)] 98.6 F (37 C) (05/18 0729) Pulse Rate:  [79-125] 79 (05/18 0729) Resp:  [13-19] 16 (05/18 0729) BP: (98-194)/(45-109) 129/78 (05/18 0729) SpO2:  [94 %-99 %] 99 % (05/18 0729) Weight:  [63.5 kg] 63.5 kg (05/17 1143)   General:    white female in NAD Heart:  Regular rate and  rhythm; no murmurs Lungs: Respirations even and unlabored, lungs CTA bilaterally Abdomen:  Soft, mild generalized TTP, some worse in bilateral lower quadrants and nondistended. Normal bowel sounds. Psych:  Cooperative. Normal mood and affect.  Intake/Output from previous day: 05/17 0701 - 05/18 0700 In: 1122.8 [P.O.:480; I.V.:349.5; IV Piggyback:293.2] Out: -    Lab Results: Recent Labs    08/15/21 0413 08/15/21 0425 08/16/21 0046 08/17/21 0241  WBC 19.5*  --  11.2* 8.3  HGB 20.1* 20.4* 13.4 13.5  HCT 57.2* 60.0* 38.4 40.6  PLT 276  --  179 173   BMET Recent Labs    08/15/21 0413 08/15/21 0425 08/16/21 0046 08/17/21 0241  NA 133* 132* 136 136  K 3.8 3.5 3.3* 3.7  CL 96* 102 109 107  CO2 16*  --  19* 23  GLUCOSE 115* 114* 64* 75  BUN 54* 53* 48* 25*  CREATININE 3.19* 3.20* 2.32* 1.38*  CALCIUM 9.5  --  8.2* 8.2*   LFT Recent Labs    08/17/21 0241  PROT 5.2*  ALBUMIN 2.8*  AST 32  ALT 19  ALKPHOS 38  BILITOT 1.3*   EGD 08/16/2021 Findings:      The entire esophagus was normal.      The Z-line was regular and was found 39 cm from the incisors.      Moderate inflammation characterized by congestion (edema) and erythema  was found in the entire examined stomach. There was small amounts of       scattered heme, but no ulcers or active bleeding noted. Biopsies were       taken with a cold forceps for Helicobacter pylori testing. Estimated       blood loss was minimal. There was some retained food material in the       proximal stomach which decreased some of the visualization in the       proximal gastric body and fundus. This was lavaged with improved       visualization and no additional patholgy noted. The stomach was slightly       J shaped, but the pylorus was patent and easily traversed.      The examined duodenum was normal. Impression:               - Normal esophagus.                           - Z-line regular, 39 cm from the incisors.                            - Moderate gastritis. Biopsied.                           - Normal examined duodenum. Recommendation:           - Return patient to hospital ward for ongoing care.                           - Will restart clear liquids now and can advance                            diet as tolerated.                           - Continue present medications.                           - Use Protonix (pantoprazole) 40 mg PO BID for 6                            weeks to promote mucosal healing, then reduce to 40                            mg daily and can titrate off if no additional GI                            symptoms.                           - Reglan 10 mg IV Q8 hours scheduled x72 hours.                            When symptoms improving, can change to PO prn.                           -  Continue Zofran and Phenergan prn as prescribed.                           - Await pathology results.                           - Please do not hesitate to contact the inpatient                            GI service with additional questions or concerns.   Assessment / Plan:   Assessment: 1.  Abdominal pain with nausea and vomiting: EGD 08/16/2021 as above with moderate gastritis, patient feels some better with addition of Reglan recently 2.  History of chronic idiopathic pancreatitis with biliary stenting many years ago 3.  History of pituitary tumor/Addison's disease 4.  Constipation with lower abdominal pain  Plan: 1.  Will add Dulcolax x2 today 2.  I do not believe patient would be able to handle an oral preparation such as MiraLAX 3.  If Dulcolax is unsuccessful would recommend an enema, tapwater x2 4.  Patient will continue on clears today she has not really felt like trying this yet, then it can be increased as tolerated 5.  Continue Reglan as outlined by Dr. Bryan Lemma after recent EGD, Reglan 10 mg IV every 8 hours scheduled for 72 hours, then when symptoms improving can change to p.o. as  needed 6.  Also continue Zofran and Phenergan as needed as prescribed 7.  Continue Pantoprazole 40 mg p.o. twice daily for 6 weeks, then reduce to 40 mg daily and can titrate off if no additional GI symptoms   Thank you for your kind consultation, we will now sign off.  Please call us back if you have any further questions.   LOS: 2 days   Brittany Mullins  08/17/2021, 10:55 AM

## 2021-08-17 NOTE — Telephone Encounter (Signed)
Levin Erp, PA  [place], [place], RN [place]- can you please cancel appt with Dr. Loletha Grayer- she needs follow up with [facility] instead- I guess she sees them.  Thanks, sorry-JLL

## 2021-08-17 NOTE — Telephone Encounter (Signed)
Appt with Dr. Bryan Lemma cancelled.

## 2021-08-18 ENCOUNTER — Inpatient Hospital Stay (HOSPITAL_COMMUNITY): Payer: Medicare Other

## 2021-08-18 DIAGNOSIS — R1013 Epigastric pain: Secondary | ICD-10-CM | POA: Diagnosis not present

## 2021-08-18 DIAGNOSIS — K2970 Gastritis, unspecified, without bleeding: Secondary | ICD-10-CM | POA: Diagnosis not present

## 2021-08-18 DIAGNOSIS — N179 Acute kidney failure, unspecified: Secondary | ICD-10-CM | POA: Diagnosis not present

## 2021-08-18 DIAGNOSIS — R112 Nausea with vomiting, unspecified: Secondary | ICD-10-CM | POA: Diagnosis not present

## 2021-08-18 LAB — CBC WITH DIFFERENTIAL/PLATELET
Abs Immature Granulocytes: 0.05 10*3/uL (ref 0.00–0.07)
Basophils Absolute: 0.1 10*3/uL (ref 0.0–0.1)
Basophils Relative: 1 %
Eosinophils Absolute: 0.1 10*3/uL (ref 0.0–0.5)
Eosinophils Relative: 1 %
HCT: 38.4 % (ref 36.0–46.0)
Hemoglobin: 12.6 g/dL (ref 12.0–15.0)
Immature Granulocytes: 1 %
Lymphocytes Relative: 21 %
Lymphs Abs: 2.3 10*3/uL (ref 0.7–4.0)
MCH: 30.8 pg (ref 26.0–34.0)
MCHC: 32.8 g/dL (ref 30.0–36.0)
MCV: 93.9 fL (ref 80.0–100.0)
Monocytes Absolute: 1.1 10*3/uL — ABNORMAL HIGH (ref 0.1–1.0)
Monocytes Relative: 10 %
Neutro Abs: 7.3 10*3/uL (ref 1.7–7.7)
Neutrophils Relative %: 66 %
Platelets: 165 10*3/uL (ref 150–400)
RBC: 4.09 MIL/uL (ref 3.87–5.11)
RDW: 12.5 % (ref 11.5–15.5)
WBC: 10.9 10*3/uL — ABNORMAL HIGH (ref 4.0–10.5)
nRBC: 0 % (ref 0.0–0.2)

## 2021-08-18 LAB — COMPREHENSIVE METABOLIC PANEL
ALT: 18 U/L (ref 0–44)
AST: 23 U/L (ref 15–41)
Albumin: 3 g/dL — ABNORMAL LOW (ref 3.5–5.0)
Alkaline Phosphatase: 35 U/L — ABNORMAL LOW (ref 38–126)
Anion gap: 7 (ref 5–15)
BUN: 14 mg/dL (ref 6–20)
CO2: 24 mmol/L (ref 22–32)
Calcium: 8.3 mg/dL — ABNORMAL LOW (ref 8.9–10.3)
Chloride: 105 mmol/L (ref 98–111)
Creatinine, Ser: 1.31 mg/dL — ABNORMAL HIGH (ref 0.44–1.00)
GFR, Estimated: 53 mL/min — ABNORMAL LOW (ref >60)
Glucose, Bld: 94 mg/dL (ref 70–99)
Potassium: 3.6 mmol/L (ref 3.5–5.1)
Sodium: 136 mmol/L (ref 135–145)
Total Bilirubin: 0.7 mg/dL (ref 0.3–1.2)
Total Protein: 5.5 g/dL — ABNORMAL LOW (ref 6.5–8.1)

## 2021-08-18 LAB — PHOSPHORUS: Phosphorus: 2.9 mg/dL (ref 2.5–4.6)

## 2021-08-18 LAB — SURGICAL PATHOLOGY

## 2021-08-18 LAB — MAGNESIUM: Magnesium: 1.6 mg/dL — ABNORMAL LOW (ref 1.7–2.4)

## 2021-08-18 MED ORDER — MAGNESIUM OXIDE -MG SUPPLEMENT 400 (240 MG) MG PO TABS
800.0000 mg | ORAL_TABLET | Freq: Once | ORAL | Status: AC
  Administered 2021-08-18: 800 mg via ORAL
  Filled 2021-08-18: qty 2

## 2021-08-18 MED ORDER — MAGNESIUM SULFATE 2 GM/50ML IV SOLN
2.0000 g | Freq: Once | INTRAVENOUS | Status: AC
  Administered 2021-08-18: 2 g via INTRAVENOUS
  Filled 2021-08-18: qty 50

## 2021-08-18 MED ORDER — SODIUM CHLORIDE 0.9 % IV SOLN: INTRAVENOUS | Status: DC

## 2021-08-18 NOTE — Progress Notes (Signed)
PROGRESS NOTE    Brittany Mullins  LTJ:[id number] DOB: 1980-11-07 DOA: 08/15/2021 PCP: Julian Hy, PA-C   Brief Narrative:  The patient is [name] with a past medical history significant for but limited to history of cyclical vomiting, history of chronic pain syndrome on methadone, seizure disorder, and THC use as well as other comorbidities including a pituitary tumor and PCOS and Addison's disease who presented with intractable nausea, vomiting and abdominal pain.  She takes methadone and not able to keep it down.  She states that she woke up sick a few days ago and this was the first time this happened to her.  She denies any diarrhea but she is described abdominal pain is midepigastric.  She states that she had some emesis that was coffee-ground in appearance and had 3-4 bloody episodes of this.  She indicated that she had a bleeding ulcer in childhood and has never seen a GI doctor afterwards.  She denies any bloody bowel movements and denies any NSAIDs or Goody powders.  In the ED she is found to have an AKI and she was having cyclical vomiting and difficulty tolerating p.o.  She is given IV fluid resuscitation and given 3 L of normal saline boluses and then now placed on maintenance IV fluids.  Given that her continued nausea and vomiting and concern for coffee-ground emesis GI has been consulted for further evaluation and recommendations.  GI took the patient for an EGD and it showed moderate gastritis that was biopsied. She was stared on a CLD and placed on Pantoprazole 40 mg po BID x6 weeks sand then 40 mg po Daily afterwards. She was also started on IV Reglan 10 mg q8h x 72 hours. Renal Fxn continues to Improve but still not normal so will likely D/C in the AM. Her Diet will be advanced to Full today and Soft Tomorrow.    Assessment and Plan:  Intractable Nausea/Vomiting associated with Abdominal Pain, improving  Acute diarrhea Coffee Ground Emesis in the  setting of Gastritis  -Patient presenting with recurrent n/v and abdominal pain -Has reported h/o Addison's disease but is not taking medications for this  -Has chronic pain, on methadone - withdrawal is less likely with methadone since it is so long-acting but this is a consideration -May be related to cannabinoid hyperemesis syndrome, as she is continuing to use -She does have a Hx of Bleeding Gastric Ulcer as a child  -CT Renal Stone study showed "Multiple tiny 2-3 mm nonobstructive calculi are noted within the collecting systems of both kidneys. No additional calculi are noted along the course of either ureter or within  the lumen of the urinary bladder. No hydroureteronephrosis. Unenhanced appearance of the kidneys, bilateral adrenal glands and urinary bladder is otherwise unremarkable."CT also showed "Unenhanced appearance of the stomach is normal. No pathologic dilatation of small bowel or colon. The appendix is not confidently identified and may be surgically absent. Regardless, there are no inflammatory changes noted adjacent to the cecum to suggest the presence of an acute appendicitis at this time." -Admitted to Telemetry  -Encourage marijuana cessation and a good bowel regimen as well as behavioral health support  -Elevated lactate is thought to be related to nv//dehydration and has trended down with IV fluid resuscitation and went from 2.0 and is now 0.7 -Lipase level was normal on admission and AST was mildly elevated but trended down to 36 -WBC went from 19.5 -> 11.2 -> 8.3 but is slightly elevated now 10.9 -GI has  been consulted and planning for an EGD which showed moderate gastritis that was biopsied and a normal esophagus -Patient does have a history of chronic idiopathic pancreatitis with biliary stenting many years ago at [facility] but recent CT scan showed no evidence of retained stents and most recent CT scan showed no pancreatic abnormality; at [facility] in 2022 her CT showed  prominent biliary and pancreatic ductal dilatation and MRCP was recommended but yet to be performed -She continues to have intractable nausea vomiting and she is on Zofran 4 mg p.o./IV every 6 as needed for nausea and also has a Phenergan 25 mg rectal suppository every 6 as needed for refractory nausea vomiting;  GI also added IV Metoclopramide 10 mg q8 x72 hours  -Now GI recommending PPI BID x 6 weeks then po Pantoprazole 40 mg Daily  -She is having significant amount of diarrhea now so we will hold her laxatives and discontinue her scheduled bisacodyl and as needed MiraLAX -Full liquid diet will be increased to a soft diet -She is asking for Phenergan through the IV but this is on national back order and is caustic so we will initiate Compazine 10 mg IV Q6 as needed for nausea vomiting refractory nausea; she continues to have nausea despite being on Zofran Compazine and metoclopramide -Check a GI pathogen panel but low clinical suspicion for C. difficile given that she has been on scheduled laxatives -Check KUB "The bowel gas pattern is normal. Surgical clips in the right upper quadrant. No radio-opaque calculi or other significant radiographic abnormality are seen." -Further care per GI   AKI, improving  Metabolic acidosis -Patient's BUNs/creatinine on admission was 54/3.19 -> 48/2.32 -> 25/1.38 -> 14/1.31 -Likely pre-renal associated with intractable n/v -She is given multiple fluid boluses yesterday and then started on maintenance IV fluid hydration with LR which have now stopped and initiated normal saline at 75 mils per hour and will continue  -She did have a slight metabolic acidosis with a CO2 of 19, chloride level of 109, anion gap of 8; Now CO2 is 23, AG is 6, and Chloride Level is 107 -Avoid further nephrotoxic medications, contrast dyes, hypotension and dehydration to ensure adequate renal perfusion and renally adjust medications -Continue to monitor and trend renal function carefully  and repeat CMP in the a.m.   Chronic pain syndrome -Dr. Lorin Mercy has reviewed this patient in [facility] Controlled Substances [facility] System.  There are no listed controlled substances since she obtains her methadone at a pain clinic. -Will resume home methadone at 120 mg p.o. daily -Oxycodone 5 mg p.o. every 4 as needed for moderate pain  -IV morphine 2 mg every 2 as needed severe pain as needed will be changed to every 4 as needed for breakthrough pain; with no plan for further escalation at this time -Phenergan given PR for breakthrough n/v -UDS was checked and was positive for THC   Hypocalcemia -Calcium is now 8.2 again today and for correction of albumin is now 9.2 -Given 1 g  IV calcium gluconate x1 earlier in hospitalization   Elevated Blood Pressure/Accelerated Hypertension -In the setting of intractable nausea vomiting -Initiated hydralazine but increase to 10 mg q. for as needed for systolic blood pressure greater than 440 or diastolic blood pressure 347 -Continue monitor blood pressures per protocol -Last blood pressure reading was 153/78   Hyponatremia -In the setting of dehydration and nausea vomiting -Improved with IV fluid hydration and sodium went from 132 is now 136 x2 -Continue monitor  and trend and repeat CMP in the a.m.   Hyperbilirubinemia -Likely reactive and improved -T. bili is trending downward from 1.6 -> 1.4 -> 1.3 -> 0.7 -Continue monitor and trend and repeat CMP in the a.m.  Hypomagnesemia -Mag level is now 1.6 -Replete with IV mag sulfate 2 g however her IV infiltrated while this was happening so we will change to p.o. mag oxide 800 mg at 1 -Continue to monitor and replete as necessary -Repeat magnesium level in the a.m.   Hypokalemia -Mild and potassium is now 3.6 -Continue monitor and replete as necessary -Repeat CMP in a.m.   History of pituitary tumor -He had a CT of the head back in July 2021 showed a partially empty sella and sinus wall  thickening   Addison's disease -Not on any corticosteroids at home   Hypoalbuminemia -Patient's albumin level is 2.8 x2 and is now 3.0 -Continue monitor and trend and repeat CMP in the AM   DVT prophylaxis: enoxaparin (LOVENOX) injection 40 mg Start: 08/18/21 1200    Code Status: Full Code Family Communication: No family present at bedside   Disposition Plan:  Level of care: Telemetry Medical Status is: Inpatient Remains inpatient appropriate because: Continues to Have Abdominal Discomfort and now has significant Diarrhea   Consultants:  Gastroenterology  Procedures:  EGD Findings:      The entire esophagus was normal.      The Z-line was regular and was found 39 cm from the incisors.      Moderate inflammation characterized by congestion (edema) and erythema       was found in the entire examined stomach. There was small amounts of       scattered heme, but no ulcers or active bleeding noted. Biopsies were       taken with a cold forceps for Helicobacter pylori testing. Estimated       blood loss was minimal. There was some retained food material in the       proximal stomach which decreased some of the visualization in the       proximal gastric body and fundus. This was lavaged with improved       visualization and no additional patholgy noted. The stomach was slightly       J shaped, but the pylorus was patent and easily traversed.      The examined duodenum was normal. Impression:               - Normal esophagus.                           - Z-line regular, 39 cm from the incisors.                           - Moderate gastritis. Biopsied.                           - Normal examined duodenum. Recommendation:           - Return patient to hospital ward for ongoing care.                           - Will restart clear liquids now and can advance  diet as tolerated.                           - Continue present medications.                            - Use Protonix (pantoprazole) 40 mg PO BID for 6                            weeks to promote mucosal healing, then reduce to 40                            mg daily and can titrate off if no additional GI                            symptoms.                           - Reglan 10 mg IV Q8 hours scheduled x72 hours.                            When symptoms improving, can change to PO prn.                           - Continue Zofran and Phenergan prn as prescribed.                           - Await pathology results.                           - Please do not hesitate to contact the inpatient                            GI service with additional questions or concerns.  Antimicrobials:  Anti-infectives (From admission, onward)    Start     Dose/Rate Route Frequency Ordered Stop   08/15/21 0545  cefTRIAXone (ROCEPHIN) 1 g in sodium chloride 0.9 % 100 mL IVPB        1 g 200 mL/hr over 30 Minutes Intravenous  Once 08/15/21 0535 08/15/21 0746       Subjective: Seen and examined at bedside and she was still feeling bad and appeared to be slightly uncomfortable.  States that she is having abdominal discomfort and still having nausea.  Now having significant diarrhea.  She is tolerating clears and falls okay but is willing to try soft tonight.  States that she has had over 8 bowel movements overnight.  No other concerns or complaints at this time.  Objective: Vitals:   08/17/21 2007 08/18/21 0408 08/18/21 0743 08/18/21 1417  BP: 129/80 (!) 147/91 138/90 (!) 153/78  Pulse: 72 70 85 95  Resp: '16 11 18 19  '$ Temp: 98.6 F (37 C) 98.1 F (36.7 C) 98.2 F (36.8 C) 99.3 F (37.4 C)  TempSrc: Oral Oral Oral Oral  SpO2: 99% 97% 98% 98%  Weight:      Height:        Intake/Output Summary (Last 24 hours) at 08/18/2021 1624 Last data filed at 08/18/2021  2297 Gross per 24 hour  Intake 2576.48 ml  Output --  Net 2576.48 ml   Filed Weights   08/15/21 0339 08/16/21 1143  Weight: 63.5 kg 63.5 kg    Examination: Physical Exam:  Constitutional: WN/WD Caucasian female who appears uncomfortable Respiratory: Diminished to auscultation bilaterally with coarse breath sounds, no wheezing, rales, rhonchi or crackles. Normal respiratory effort and patient is not tachypenic. No accessory muscle use.  Cardiovascular: RRR, no murmurs / rubs / gallops. S1 and S2 auscultated. No extremity edema.   Abdomen: Soft, non-tender, non-distended. Bowel sounds positive.  GU: Deferred. Musculoskeletal: No clubbing / cyanosis of digits/nails. No joint deformity upper and lower extremities.  Skin: No rashes, lesions, ulcers. No induration; Warm and dry.  Neurologic: CN 2-12 grossly intact with no focal deficits. Romberg sign and cerebellar reflexes not assessed.  Psychiatric: Normal judgment and insight. Alert and oriented x 3. Normal mood and appropriate affect.   Data Reviewed: I have personally reviewed following labs and imaging studies  CBC: Recent Labs  Lab 08/15/21 0413 08/15/21 0425 08/16/21 0046 08/17/21 0241 08/18/21 0146  WBC 19.5*  --  11.2* 8.3 10.9*  NEUTROABS 17.2*  --   --  4.9 7.3  HGB 20.1* 20.4* 13.4 13.5 12.6  HCT 57.2* 60.0* 38.4 40.6 38.4  MCV 89.1  --  90.6 92.7 93.9  PLT 276  --  179 173 989   Basic Metabolic Panel: Recent Labs  Lab 08/15/21 0413 08/15/21 0425 08/16/21 0046 08/17/21 0241 08/18/21 0146  NA 133* 132* 136 136 136  K 3.8 3.5 3.3* 3.7 3.6  CL 96* 102 109 107 105  CO2 16*  --  19* 23 24  GLUCOSE 115* 114* 64* 75 94  BUN 54* 53* 48* 25* 14  CREATININE 3.19* 3.20* 2.32* 1.38* 1.31*  CALCIUM 9.5  --  8.2* 8.2* 8.3*  MG  --   --  2.0 1.9 1.6*  PHOS  --   --   --  2.8 2.9   GFR: Estimated Creatinine Clearance: 51.7 mL/min (A) (by C-G formula based on SCr of 1.31 mg/dL (H)). Liver Function Tests: Recent Labs  Lab 08/15/21 0413 08/16/21 0046 08/17/21 0241 08/18/21 0146  AST 45* 36 32 23  ALT 38 '25 19 18  '$ ALKPHOS 66 39 38 35*  BILITOT 1.6* 1.4*  1.3* 0.7  PROT 8.0 5.2* 5.2* 5.5*  ALBUMIN 4.2 2.8* 2.8* 3.0*   Recent Labs  Lab 08/15/21 0413  LIPASE 22   No results for input(s): AMMONIA in the last 168 hours. Coagulation Profile: No results for input(s): INR, PROTIME in the last 168 hours. Cardiac Enzymes: No results for input(s): CKTOTAL, CKMB, CKMBINDEX, TROPONINI in the last 168 hours. BNP (last 3 results) No results for input(s): PROBNP in the last 8760 hours. HbA1C: No results for input(s): HGBA1C in the last 72 hours. CBG: No results for input(s): GLUCAP in the last 168 hours. Lipid Profile: No results for input(s): CHOL, HDL, LDLCALC, TRIG, CHOLHDL, LDLDIRECT in the last 72 hours. Thyroid Function Tests: No results for input(s): TSH, T4TOTAL, FREET4, T3FREE, THYROIDAB in the last 72 hours. Anemia Panel: No results for input(s): VITAMINB12, FOLATE, FERRITIN, TIBC, IRON, RETICCTPCT in the last 72 hours. Sepsis Labs: Recent Labs  Lab 08/15/21 0819 08/15/21 2105 08/16/21 0046  LATICACIDVEN 2.0* 1.1 0.7    No results found for this or any previous visit (from the past 240 hour(s)).   Radiology Studies: DG Abd 1 View  Result Date: 08/18/2021 CLINICAL DATA:  Abdominal pain midline abdominal pain with nausea vomiting and diarrhea for 4 days. EXAM: ABDOMEN - 1 VIEW COMPARISON:  None Available. FINDINGS: The bowel gas pattern is normal. Surgical clips in the right upper quadrant. No radio-opaque calculi or other significant radiographic abnormality are seen. IMPRESSION: Negative. Electronically Signed   By: Keane Police D.O.   On: 08/18/2021 11:17     Scheduled Meds:  bisacodyl  5 mg Oral BID   enoxaparin (LOVENOX) injection  40 mg Subcutaneous Q24H   feeding supplement  1 Container Oral TID BM   levonorgestrel-ethinyl estradiol  1 tablet Oral Daily   methadone  120 mg Oral Daily   metoCLOPramide (REGLAN) injection  10 mg Intravenous Q8H   pantoprazole  40 mg Oral BID   sodium chloride flush  3 mL Intravenous  Q12H   Continuous Infusions:  sodium chloride 75 mL/hr at 08/18/21 1444    LOS: 3 days   Kerney Elbe, DO [facility] Available via Epic secure chat 7am-7pm After these hours, please refer to coverage provider listed on [email or web address] 08/18/2021, 4:24 PM

## 2021-08-18 NOTE — Care Management Important Message (Signed)
Important Message  Patient Details  Name: Brittany Mullins MRN: [id number] Date of Birth: [birth year]   Medicare Important Message Given:  Yes     Kiefer Opheim Montine Circle 08/18/2021, 4:11 PM

## 2021-08-18 NOTE — Progress Notes (Signed)
Pt had large liquid bowel movement.

## 2021-08-18 NOTE — Plan of Care (Signed)

## 2021-08-18 NOTE — TOC CAGE-AID Note (Signed)
Transition of Care [facility]) - CAGE-AID Screening   Patient Details  Name: Molly Savarino MRN: [id number] Date of Birth: 1981-02-05  Transition of Care [facility]) CM/SW Contact:    Sharin Mons, RN Phone Number: 08/18/2021, 2:09 PM   Clinical Narrative:  Screening done. Pt declined help/ resources /educational information.  CAGE-AID Screening:    Have You Ever Felt You Ought to Cut Down on Your Drinking or Drug Use?: No (No, i come from a state where Marijuana is legal.) Have People Annoyed You By Critizing Your Drinking Or Drug Use?: No Have You Felt Bad Or Guilty About Your Drinking Or Drug Use?: No Have You Ever Had a Drink or Used Drugs First Thing In The Morning to Steady Your Nerves or to Get Rid of a Hangover?: No CAGE-AID Score: 0  Substance Abuse Education Offered: Yes (Pt declined resources and educational material)

## 2021-08-18 NOTE — TOC Initial Note (Addendum)
Transition of Care [facility]) - Initial/Assessment Note    Patient Details  Name: Brittany Mullins MRN: [id number] Date of Birth: 07-28-1980  Transition of Care [facility]) CM/SW Contact:    Sharin Mons, RN Phone Number: 08/18/2021, 10:41 AM  Clinical Narrative:                 Presents with intractable nausea and vomiting. Hx of cyclical vomiting, chronic pain on methadone, seizures, and THC. From home with son. States  PTA independent with ADL's , no DME usage.  No present needs identified per NCM. TOC team will continue to monitor for needs...  Expected Discharge Plan: Home/Self Care Barriers to Discharge: Continued Medical Work up   Patient Goals and CMS Choice        Expected Discharge Plan and Services Expected Discharge Plan: Home/Self Care       Living arrangements for the past 2 months: Single Family Home                                      Prior Living Arrangements/Services Living arrangements for the past 2 months: Single Family Home Lives with:: Adult Children Patient language and need for interpreter reviewed:: Yes Do you feel safe going back to the place where you live?: Yes      Need for Family Participation in Patient Care: Yes (Comment) Care giver support system in place?: Yes (comment)   Criminal Activity/Legal Involvement Pertinent to Current Situation/Hospitalization: No - Comment as needed  Activities of Daily Living Home Assistive Devices/Equipment: None ADL Screening (condition at time of admission) Patient's cognitive ability adequate to safely complete daily activities?: Yes Is the patient deaf or have difficulty hearing?: No Does the patient have difficulty seeing, even when wearing glasses/contacts?: No Does the patient have difficulty concentrating, remembering, or making decisions?: No Patient able to express need for assistance with ADLs?: Yes Does the patient have difficulty dressing or bathing?: No Independently performs  ADLs?: Yes (appropriate for developmental age) Does the patient have difficulty walking or climbing stairs?: No Weakness of Legs: None Weakness of Arms/Hands: None  Permission Sought/Granted                  Emotional Assessment Appearance:: Appears stated age Attitude/Demeanor/Rapport: Engaged Affect (typically observed): Accepting Orientation: : Oriented to Self, Oriented to  Time, Oriented to Place, Oriented to Situation Alcohol / Substance Use: Illicit Drugs (Marijuana) Psych Involvement: No (comment)  Admission diagnosis:  Dehydration [E86.0] AKI (acute kidney injury) ([place]) [N17.9] Nausea and vomiting in adult [R11.2] Patient Active Problem List   Diagnosis Date Noted   Abdominal pain, epigastric    Gastritis and gastroduodenitis    AKI (acute kidney injury) ([place]) 08/15/2021   Intractable nausea and vomiting 10/25/2019   History of Addison's disease    Intractable cyclical vomiting with nausea    Opioid dependence ([place]) 06/03/2017   Generalized abdominal pain    Hypomagnesemia    Marijuana use    PCP:  Julian Hy, PA-C Pharmacy:   [facility] [address] [place] [address] Phone: [phone] Fax: [address], [place] [address] Phone: [phone] Fax: [phone]     Social Determinants  of Health (SDOH) Interventions    Readmission Risk Interventions     View : No data to display.

## 2021-08-19 ENCOUNTER — Inpatient Hospital Stay (HOSPITAL_COMMUNITY): Payer: Medicare Other

## 2021-08-19 DIAGNOSIS — N179 Acute kidney failure, unspecified: Secondary | ICD-10-CM | POA: Diagnosis not present

## 2021-08-19 DIAGNOSIS — R1013 Epigastric pain: Secondary | ICD-10-CM | POA: Diagnosis not present

## 2021-08-19 DIAGNOSIS — K2970 Gastritis, unspecified, without bleeding: Secondary | ICD-10-CM | POA: Diagnosis not present

## 2021-08-19 DIAGNOSIS — R112 Nausea with vomiting, unspecified: Secondary | ICD-10-CM | POA: Diagnosis not present

## 2021-08-19 LAB — COMPREHENSIVE METABOLIC PANEL
ALT: 16 U/L (ref 0–44)
AST: 20 U/L (ref 15–41)
Albumin: 2.9 g/dL — ABNORMAL LOW (ref 3.5–5.0)
Alkaline Phosphatase: 39 U/L (ref 38–126)
Anion gap: 9 (ref 5–15)
BUN: 13 mg/dL (ref 6–20)
CO2: 26 mmol/L (ref 22–32)
Calcium: 8.6 mg/dL — ABNORMAL LOW (ref 8.9–10.3)
Chloride: 103 mmol/L (ref 98–111)
Creatinine, Ser: 1.17 mg/dL — ABNORMAL HIGH (ref 0.44–1.00)
GFR, Estimated: >60 mL/min (ref >60)
Glucose, Bld: 88 mg/dL (ref 70–99)
Potassium: 3.9 mmol/L (ref 3.5–5.1)
Sodium: 138 mmol/L (ref 135–145)
Total Bilirubin: 0.6 mg/dL (ref 0.3–1.2)
Total Protein: 5.7 g/dL — ABNORMAL LOW (ref 6.5–8.1)

## 2021-08-19 LAB — GASTROINTESTINAL PANEL BY PCR, STOOL (REPLACES STOOL CULTURE)

## 2021-08-19 LAB — CBC WITH DIFFERENTIAL/PLATELET
Abs Immature Granulocytes: 0.02 10*3/uL (ref 0.00–0.07)
Basophils Absolute: 0 10*3/uL (ref 0.0–0.1)
Basophils Relative: 1 %
Eosinophils Absolute: 0.3 10*3/uL (ref 0.0–0.5)
Eosinophils Relative: 3 %
HCT: 38.9 % (ref 36.0–46.0)
Hemoglobin: 12.8 g/dL (ref 12.0–15.0)
Immature Granulocytes: 0 %
Lymphocytes Relative: 23 %
Lymphs Abs: 2 10*3/uL (ref 0.7–4.0)
MCH: 30.5 pg (ref 26.0–34.0)
MCHC: 32.9 g/dL (ref 30.0–36.0)
MCV: 92.6 fL (ref 80.0–100.0)
Monocytes Absolute: 0.8 10*3/uL (ref 0.1–1.0)
Monocytes Relative: 9 %
Neutro Abs: 5.6 10*3/uL (ref 1.7–7.7)
Neutrophils Relative %: 64 %
Platelets: 174 10*3/uL (ref 150–400)
RBC: 4.2 MIL/uL (ref 3.87–5.11)
RDW: 12 % (ref 11.5–15.5)
WBC: 8.7 10*3/uL (ref 4.0–10.5)
nRBC: 0 % (ref 0.0–0.2)

## 2021-08-19 LAB — PHOSPHORUS: Phosphorus: 4.1 mg/dL (ref 2.5–4.6)

## 2021-08-19 LAB — MAGNESIUM: Magnesium: 1.6 mg/dL — ABNORMAL LOW (ref 1.7–2.4)

## 2021-08-19 MED ORDER — MAGNESIUM OXIDE -MG SUPPLEMENT 400 (240 MG) MG PO TABS
400.0000 mg | ORAL_TABLET | Freq: Once | ORAL | Status: AC
  Administered 2021-08-19: 400 mg via ORAL
  Filled 2021-08-19: qty 1

## 2021-08-19 MED ORDER — IOHEXOL 9 MG/ML PO SOLN
500.0000 mL | ORAL | Status: AC
  Administered 2021-08-19 (×2): 500 mL via ORAL

## 2021-08-19 MED ORDER — IOHEXOL 300 MG/ML  SOLN
100.0000 mL | Freq: Once | INTRAMUSCULAR | Status: AC | PRN
  Administered 2021-08-19: 100 mL via INTRAVENOUS

## 2021-08-19 MED ORDER — PROMETHAZINE HCL 25 MG PO TABS
25.0000 mg | ORAL_TABLET | Freq: Four times a day (QID) | ORAL | Status: DC | PRN
  Administered 2021-08-20 – 2021-08-21 (×2): 25 mg via ORAL
  Filled 2021-08-19 (×3): qty 1

## 2021-08-19 NOTE — Progress Notes (Addendum)
Re-Consult Note   Subjective  Chief Complaint: Nausea and diarrhea  Patient was previously followed by our service and we have signed off on 08/17/2021.  She has been seen by [place] for nausea and vomiting.  She had an EGD 08/16/2021 with normal esophagus and moderate gastritis.  She was restarted on clear liquids and advance as tolerated.  Told to use Pantoprazole 40 mg p.o. twice daily for 6 weeks for mucosal healing and then reduce to 40 mg daily.  Also given Reglan 10 mg IV every 8 hours scheduled for the next 3 days.  Continued on Zofran and Phenergan.  When we saw her on 5/18 she was having some constipation and given Dulcolax x2.  She was told to follow-up at [facility] where she was previously seen and given anticipated outpatient GI needs a larger academic center.  08/18/2021 patient's diet was advanced to full liquids.  Noted history of marijuana use and that symptoms may be related to cannabinoid hyperemesis syndrome.  She was having a significant amount of diarrhea so her laxatives were held with discontinuation of bisacodyl and MiraLAX.  A full liquid diet was increased to soft.  She was started on Compazine 10 mg IV every 6 hours.  GI pathogen panel was checked given diarrhea.  KUB showed normal gas pattern.  She does have chronic pain syndrome and is on methadone.  08/19/2021 continues with nausea and vomiting and unable to tolerate a soft diet.  Abdominal x-ray was negative.     At time of my interview today the patient tells me that really her nausea feels like it is under control.  She is still nauseous but she is tolerating a soft diet when I walk in having eaten three quarters of a hamburger and most of the sides.  Tells me that the Phenergan helps her the most.  Her biggest complaint at this very moment is her diarrhea, having multiple liquidy stools over the past few days.  She tells me it is kind of "mucousy looking".  Tells me that she did have a day of diarrhea before starting with the  nausea and vomiting but then just got constipated and was given laxatives.  These have since been stopped.  No blood in her stool.  Tells me she had a problem with C. difficile before and they have a pregnant stepdaughter living with them and they wanted make sure this is not the cause.    Her husband is in the room and tells me that he started with diarrhea that seems to match the description of his wife's and some nausea just a little while ago.  He is wondering if there is something in their home that is causing him to be sick.  They tell me they do not drink tap water though.  Currently their apartment has been deemed "unlivable" and they are trying to find a new place to live.  Apparently there are some mold issues.   Denies fever or chills.    Objective   Vital signs in last 24 hours: Temp:  [97.3 F (36.3 C)-99.3 F (37.4 C)] 98.1 F (36.7 C) (05/20 0735) Pulse Rate:  [78-95] 84 (05/20 0735) Resp:  [18-19] 18 (05/20 0454) BP: (124-158)/(78-99) 137/91 (05/20 0735) SpO2:  [98 %-100 %] 100 % (05/20 0735) Last BM Date : 08/19/21 General:    white female in NAD Heart:  Regular rate and rhythm; no murmurs Lungs: Respirations even and unlabored, lungs CTA bilaterally Abdomen:  Soft, mil  generalized ttp and nondistended. Normal bowel sounds. Psych:  Cooperative. Normal mood and affect.  Intake/Output from previous day: 05/19 0701 - 05/20 0700 In: 1025.6 [P.O.:240; I.V.:785.6] Out: -    Lab Results: Recent Labs    08/17/21 0241 08/18/21 0146 08/19/21 0146  WBC 8.3 10.9* 8.7  HGB 13.5 12.6 12.8  HCT 40.6 38.4 38.9  PLT 173 165 174   BMET Recent Labs    08/17/21 0241 08/18/21 0146 08/19/21 0146  NA 136 136 138  K 3.7 3.6 3.9  CL 107 105 103  CO2 '23 24 26  '$ GLUCOSE 75 94 88  BUN 25* 14 13  CREATININE 1.38* 1.31* 1.17*  CALCIUM 8.2* 8.3* 8.6*   LFT Recent Labs    08/19/21 0146  PROT 5.7*  ALBUMIN 2.9*  AST 20  ALT 16  ALKPHOS 39  BILITOT 0.6     Studies/Results: DG Abd 1 View  Result Date: 08/18/2021 CLINICAL DATA:  Abdominal pain midline abdominal pain with nausea vomiting and diarrhea for 4 days. EXAM: ABDOMEN - 1 VIEW COMPARISON:  None Available. FINDINGS: The bowel gas pattern is normal. Surgical clips in the right upper quadrant. No radio-opaque calculi or other significant radiographic abnormality are seen. IMPRESSION: Negative. Electronically Signed   By: Keane Police D.O.   On: 08/18/2021 11:17       Assessment / Plan:   Assessment: 1.  Nausea and vomiting: CT renal stone study 5/16 with no acute findings, multiple 2-3 mm nonobstructive calculi in the collecting systems of both kidneys, abdominal x-ray 5/19 normal, EGD 5/17 with moderate gastritis, per patient this is actually some better over the past 24 to 48 hours, she is tolerating a soft diet which consisted of a hamburger this afternoon, remains somewhat nauseous but has not had any vomiting, tells me Phenergan works the best 2.  Diarrhea: Over the past 48 hours has developed diarrhea, GI pathogen panel is negative, abdominal x-ray as above normal; consider CHF versus other 3.  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  History of pituitary tumor/Addison's disease  Plan: 1.  Ordered C. difficile testing though there is low likelihood for this.  Family wanted to ensure this is not to the problem. Also ordered ova and parasites and fecal pancreatic elastase. 2.  Recommend starting Imodium and see how she does, may need to be scheduled this out if there is no immediate change 3.  Went ahead and ordered ct a/p with contrast given that renal function is improving 4.  Continue supportive measures 5.  Continue current diet as she is tolerating 6.  Patient wanted to try oral Phenergan tablets so I have ordered 25 mg every 6 as needed for nausea instead of suppositories.  This is something she could go home on if it helps with her  nausea.  Thank you for your kind reconsultation, we will see her tomorrow and see how she is doing.    LOS: 4 days   Levin Erp  08/19/2021, 1:45 PM

## 2021-08-19 NOTE — Progress Notes (Signed)
PROGRESS NOTE    Brittany Mullins  PYK:[id number] DOB: 1980/08/23 DOA: 08/15/2021 PCP: Julian Hy, PA-C   Brief Narrative:  The patient is [name] with a past medical history significant for but limited to history of cyclical vomiting, history of chronic pain syndrome on methadone, seizure disorder, and THC use as well as other comorbidities including a pituitary tumor and PCOS and Addison's disease who presented with intractable nausea, vomiting and abdominal pain.  She takes methadone and not able to keep it down.  She states that she woke up sick a few days ago and this was the first time this happened to her.  She denies any diarrhea but she is described abdominal pain is midepigastric.  She states that she had some emesis that was coffee-ground in appearance and had 3-4 bloody episodes of this.  She indicated that she had a bleeding ulcer in childhood and has never seen a GI doctor afterwards.  She denies any bloody bowel movements and denies any NSAIDs or Goody powders.  In the ED she is found to have an AKI and she was having cyclical vomiting and difficulty tolerating p.o.  She is given IV fluid resuscitation and given 3 L of normal saline boluses and then now placed on maintenance IV fluids.  Given that her continued nausea and vomiting and concern for coffee-ground emesis GI has been consulted for further evaluation and recommendations.  GI took the patient for an EGD and it showed moderate gastritis that was biopsied. She was stared on a CLD and placed on Pantoprazole 40 mg po BID x6 weeks sand then 40 mg po Daily afterwards. She was also started on IV Reglan 10 mg q8h x 72 hours. Renal Fxn continues to Improve but still not normal.  Diet was advanced to full liquid yesterday and is now soft now.  She is still complaining of some nausea and now having significant amount of diarrhea so we will check her GI pathogen panel.  Continue with maintenance IV fluid  hydration again and now BUN/Cr is improved to 13/1.17.   She is to have significant amount of diarrhea and continues to be nauseous and states that she vomited.  Not able to really tolerate her soft diet.  We will have GI evaluate again.  Low suspicion for C. difficile given that she is afebrile and has no leukocytosis.  Her diarrhea could have been in the setting of laxatives that was superimposed on her acute viral gastroenteritis.  If she is not improving further we will consider repeating CT scan of the abdomen pelvis.   Assessment and Plan:  Intractable Nausea/Vomiting associated with Abdominal Pain Acute Diarrhea Coffee Ground Emesis in the setting of Gastritis  -Patient presenting with recurrent n/v and abdominal pain -Has reported h/o Addison's disease but is not taking medications for this  -Has chronic pain, on methadone - withdrawal is less likely with methadone since it is so long-acting but this is a consideration -May be related to cannabinoid hyperemesis syndrome, as she is continuing to use -She does have a Hx of Bleeding Gastric Ulcer as a child  -CT Renal Stone study showed "Multiple tiny 2-3 mm nonobstructive calculi are noted within the collecting systems of both kidneys. No additional calculi are noted along the course of either ureter or within  the lumen of the urinary bladder. No hydroureteronephrosis. Unenhanced appearance of the kidneys, bilateral adrenal glands and urinary bladder is otherwise unremarkable."CT also showed "Unenhanced appearance of the stomach is normal.  No pathologic dilatation of small bowel or colon. The appendix is not confidently identified and may be surgically absent. Regardless, there are no inflammatory changes noted adjacent to the cecum to suggest the presence of an acute appendicitis at this time." -Admitted to Telemetry  -Encourage marijuana cessation and a good bowel regimen as well as behavioral health support  -Elevated lactate is  thought to be related to nv//dehydration and has trended down with IV fluid resuscitation and went from 2.0 and is now 0.7 -Lipase level was normal on admission and AST was mildly elevated but trended down to 36 -WBC went from 19.5 -> 11.2 -> 8.3 but is slightly elevated now 10.9 -GI has been consulted and planning for an EGD which showed moderate gastritis that was biopsied and a normal esophagus -Patient does have a history of chronic idiopathic pancreatitis with biliary stenting many years ago at [facility] but recent CT scan showed no evidence of retained stents and most recent CT scan showed no pancreatic abnormality; at [facility] in 2022 her CT showed prominent biliary and pancreatic ductal dilatation and MRCP was recommended but yet to be performed -She continues to have intractable nausea vomiting and she is on Zofran 4 mg p.o./IV every 6 as needed for nausea and also has a Phenergan 25 mg rectal suppository every 6 as needed for refractory nausea vomiting;  GI also added IV Metoclopramide 10 mg q8 x72 hours which is now complete  -Now GI recommending PPI BID x 6 weeks then po Pantoprazole 40 mg Daily  -She is having significant amount of diarrhea now so we will hold her laxatives and discontinue her scheduled bisacodyl and as needed MiraLAX -Full liquid diet will be increased to a soft diet but not really able to tolerate it  -She is asking for Phenergan through the IV but this is on national back order and is caustic so we will initiate Compazine 10 mg IV Q6 as needed for nausea vomiting refractory nausea; she continues to have nausea despite being on Zofran Compazine and Metoclopramide -Check a GI pathogen panel but low clinical suspicion for C. difficile given that she has been on scheduled laxatives; GI Pathogen Panel is pending  -Checked KUB "The bowel gas pattern is normal. Surgical clips in the right upper quadrant. No radio-opaque calculi or other significant radiographic abnormality are  seen." -She continues to have Nausea and Diarrhea  -Further care per GI   AKI, improving  Metabolic acidosis -Patient's BUNs/creatinine on admission was 54/3.19 -> 48/2.32 -> 25/1.38 -> 14/1.31 -> 13/1.17  -Likely pre-renal associated with intractable n/v -She is given multiple fluid boluses yesterday and then started on maintenance IV fluid hydration with LR which have now stopped and initiated normal saline at 75 mils per hour and will continue  -She did have a slight metabolic acidosis with a CO2 of 19, chloride level of 109, anion gap of 8; Now CO2 is 26, AG is 9, and Chloride Level is 103 -Avoid further nephrotoxic medications, contrast dyes, hypotension and dehydration to ensure adequate renal perfusion and renally adjust medications -Continue to monitor and trend renal function carefully and repeat CMP in the a.m.   Chronic Pain Syndrome -Dr. Lorin Mercy has reviewed this patient in the [place] Controlled Substances Reporting System.  There are no listed controlled substances since she obtains her methadone at a pain clinic. -Will resume home methadone at 120 mg p.o. daily -Oxycodone 5 mg p.o. every 4 as needed for moderate pain  -IV morphine 2  mg every 2 as needed severe pain as needed will be changed to every 4 as needed for breakthrough pain; with no plan for further escalation at this time -Phenergan given PR for breakthrough n/v -UDS was checked and was positive for THC   Hypocalcemia -Calcium is now 8.6 -Continue to Monitor and Trend    Elevated Blood Pressure/Accelerated Hypertension -In the setting of intractable nausea vomiting -Initiated hydralazine but increase to 10 mg q. for as needed for systolic blood pressure greater than 170 or diastolic blood pressure 017 -Continue monitor blood pressures per protocol -Last blood pressure reading was 137/91   Hyponatremia -In the setting of dehydration and nausea vomiting -Improved with IV fluid hydration and sodium went from 132 is now  136 x2 -Continue monitor and trend and repeat CMP in the a.m.   Hyperbilirubinemia -Likely reactive and improved -T. bili is trending downward from 1.6 -> 1.4 -> 1.3 -> 0.7 -> 0.6 -Continue monitor and trend and repeat CMP in the a.m.   Hypomagnesemia -Mag level is now 1.6 again -Replete with IV mag sulfate 2 g yesterday however her IV infiltrated while this was happening so we will change to p.o. mag oxide 800 mg x1 and also with 400 mg po Mag Oxide  -Continue to monitor and replete as necessary -Repeat magnesium level in the a.m.   Hypokalemia -Mild and potassium is now 3.6 -Continue monitor and replete as necessary -Repeat CMP in a.m.   History of Pituitary Tumor -He had a CT of the head back in July 2021 showed a partially empty sella and sinus wall thickening   Addison's disease -Not on any corticosteroids at home   Hypoalbuminemia -Patient's albumin level is 2.8 x2 -> 3.0 -> 2.9 -Continue monitor and trend and repeat CMP in the AM   DVT prophylaxis: enoxaparin (LOVENOX) injection 40 mg Start: 08/18/21 1200    Code Status: Full Code Family Communication: No family present at bedside   Disposition Plan:  Level of care: Telemetry Medical Status is: Inpatient Remains inpatient appropriate because: Continues to have Abdominal Discomfort and has diarrhea and is not really improving clinically   Consultants:  Gastroenterology   Procedures:  EGD Findings:      The entire esophagus was normal.      The Z-line was regular and was found 39 cm from the incisors.      Moderate inflammation characterized by congestion (edema) and erythema       was found in the entire examined stomach. There was small amounts of       scattered heme, but no ulcers or active bleeding noted. Biopsies were       taken with a cold forceps for Helicobacter pylori testing. Estimated       blood loss was minimal. There was some retained food material in the       proximal stomach which decreased  some of the visualization in the       proximal gastric body and fundus. This was lavaged with improved       visualization and no additional patholgy noted. The stomach was slightly       J shaped, but the pylorus was patent and easily traversed.      The examined duodenum was normal. Impression:               - Normal esophagus.                           -  Z-line regular, 39 cm from the incisors.                           - Moderate gastritis. Biopsied.                           - Normal examined duodenum. Recommendation:           - Return patient to hospital ward for ongoing care.                           - Will restart clear liquids now and can advance                            diet as tolerated.                           - Continue present medications.                           - Use Protonix (pantoprazole) 40 mg PO BID for 6                            weeks to promote mucosal healing, then reduce to 40                            mg daily and can titrate off if no additional GI                            symptoms.                           - Reglan 10 mg IV Q8 hours scheduled x72 hours.                            When symptoms improving, can change to PO prn.                           - Continue Zofran and Phenergan prn as prescribed.                           - Await pathology results.                           - Please do not hesitate to contact the inpatient                            GI service with additional questions or concerns.  Antimicrobials:  Anti-infectives (From admission, onward)    Start     Dose/Rate Route Frequency Ordered Stop   08/15/21 0545  cefTRIAXone (ROCEPHIN) 1 g in sodium chloride 0.9 % 100 mL IVPB        1 g 200 mL/hr over 30 Minutes Intravenous  Once 08/15/21 0535 08/15/21 0746       Subjective: Seen and examined at bedside and she states that she is still  somewhat nauseous and having a lot of diarrhea.  States her bottom is raw from all the  diarrhea she is having.  Still having some abdominal discomfort and not really able to tolerate her soft diet.  Thinks she is about the same as yesterday.  Denies any chest pain or shortness of breath.  No other concerns or plaints this time.  Objective: Vitals:   08/18/21 1417 08/18/21 1900 08/19/21 0454 08/19/21 0735  BP: (!) 153/78 124/90 (!) 158/99 (!) 137/91  Pulse: 95 80 78 84  Resp: '19 18 18   '$ Temp: 99.3 F (37.4 C)  (!) 97.3 F (36.3 C) 98.1 F (36.7 C)  TempSrc: Oral   Oral  SpO2: 98% 98% 98% 100%  Weight:      Height:        Intake/Output Summary (Last 24 hours) at 08/19/2021 1312 Last data filed at 08/19/2021 0053 Gross per 24 hour  Intake 997.86 ml  Output --  Net 997.86 ml   Filed Weights   08/15/21 0339 08/16/21 1143  Weight: 63.5 kg 63.5 kg   Examination: Physical Exam:  Constitutional: WN/WD Caucasian female in NAD appears calm but uncomfortable  Respiratory: Diminished to auscultation bilaterally, no wheezing, rales, rhonchi or crackles. Normal respiratory effort and patient is not tachypenic. No accessory muscle use.  Unlabored breathing Cardiovascular: RRR, no murmurs / rubs / gallops. S1 and S2 auscultated. No extremity edema. Abdomen: Soft, Tender, distended secondary body habitus. Bowel sounds positive.  GU: Deferred. Musculoskeletal: No clubbing / cyanosis of digits/nails. No joint deformity upper and lower extremities.  Neurologic: CN 2-12 grossly intact with no focal deficits. Romberg sign cerebellar reflexes not assessed.  Psychiatric: Normal judgment and insight. Alert and oriented x 3. Normal mood and appropriate affect.   Data Reviewed: I have personally reviewed following labs and imaging studies  CBC: Recent Labs  Lab 08/15/21 0413 08/15/21 0425 08/16/21 0046 08/17/21 0241 08/18/21 0146 08/19/21 0146  WBC 19.5*  --  11.2* 8.3 10.9* 8.7  NEUTROABS 17.2*  --   --  4.9 7.3 5.6  HGB 20.1* 20.4* 13.4 13.5 12.6 12.8  HCT 57.2* 60.0* 38.4  40.6 38.4 38.9  MCV 89.1  --  90.6 92.7 93.9 92.6  PLT 276  --  179 173 165 321   Basic Metabolic Panel: Recent Labs  Lab 08/15/21 0413 08/15/21 0425 08/16/21 0046 08/17/21 0241 08/18/21 0146 08/19/21 0146  NA 133* 132* 136 136 136 138  K 3.8 3.5 3.3* 3.7 3.6 3.9  CL 96* 102 109 107 105 103  CO2 16*  --  19* '23 24 26  '$ GLUCOSE 115* 114* 64* 75 94 88  BUN 54* 53* 48* 25* 14 13  CREATININE 3.19* 3.20* 2.32* 1.38* 1.31* 1.17*  CALCIUM 9.5  --  8.2* 8.2* 8.3* 8.6*  MG  --   --  2.0 1.9 1.6* 1.6*  PHOS  --   --   --  2.8 2.9 4.1   GFR: Estimated Creatinine Clearance: 57.9 mL/min (A) (by C-G formula based on SCr of 1.17 mg/dL (H)). Liver Function Tests: Recent Labs  Lab 08/15/21 0413 08/16/21 0046 08/17/21 0241 08/18/21 0146 08/19/21 0146  AST 45* 36 32 23 20  ALT 38 '25 19 18 16  '$ ALKPHOS 66 39 38 35* 39  BILITOT 1.6* 1.4* 1.3* 0.7 0.6  PROT 8.0 5.2* 5.2* 5.5* 5.7*  ALBUMIN 4.2 2.8* 2.8* 3.0* 2.9*   Recent Labs  Lab 08/15/21 0413  LIPASE 22   No results  for input(s): AMMONIA in the last 168 hours. Coagulation Profile: No results for input(s): INR, PROTIME in the last 168 hours. Cardiac Enzymes: No results for input(s): CKTOTAL, CKMB, CKMBINDEX, TROPONINI in the last 168 hours. BNP (last 3 results) No results for input(s): PROBNP in the last 8760 hours. HbA1C: No results for input(s): HGBA1C in the last 72 hours. CBG: No results for input(s): GLUCAP in the last 168 hours. Lipid Profile: No results for input(s): CHOL, HDL, LDLCALC, TRIG, CHOLHDL, LDLDIRECT in the last 72 hours. Thyroid Function Tests: No results for input(s): TSH, T4TOTAL, FREET4, T3FREE, THYROIDAB in the last 72 hours. Anemia Panel: No results for input(s): VITAMINB12, FOLATE, FERRITIN, TIBC, IRON, RETICCTPCT in the last 72 hours. Sepsis Labs: Recent Labs  Lab 08/15/21 0819 08/15/21 2105 08/16/21 0046  LATICACIDVEN 2.0* 1.1 0.7    No results found for this or any previous visit (from the  past 240 hour(s)).   Radiology Studies: DG Abd 1 View  Result Date: 08/18/2021 CLINICAL DATA:  Abdominal pain midline abdominal pain with nausea vomiting and diarrhea for 4 days. EXAM: ABDOMEN - 1 VIEW COMPARISON:  None Available. FINDINGS: The bowel gas pattern is normal. Surgical clips in the right upper quadrant. No radio-opaque calculi or other significant radiographic abnormality are seen. IMPRESSION: Negative. Electronically Signed   By: Keane Police D.O.   On: 08/18/2021 11:17    Scheduled Meds:  enoxaparin (LOVENOX) injection  40 mg Subcutaneous Q24H   feeding supplement  1 Container Oral TID BM   levonorgestrel-ethinyl estradiol  1 tablet Oral Daily   methadone  120 mg Oral Daily   pantoprazole  40 mg Oral BID   sodium chloride flush  3 mL Intravenous Q12H   Continuous Infusions:  sodium chloride 75 mL/hr at 08/19/21 0053    LOS: 4 days   Raiford Noble, DO [facility] Available via Epic secure chat 7am-7pm After these hours, please refer to coverage provider listed on [email or web address] 08/19/2021, 1:12 PM

## 2021-08-20 DIAGNOSIS — J690 Pneumonitis due to inhalation of food and vomit: Secondary | ICD-10-CM

## 2021-08-20 DIAGNOSIS — K2970 Gastritis, unspecified, without bleeding: Secondary | ICD-10-CM | POA: Diagnosis not present

## 2021-08-20 DIAGNOSIS — N179 Acute kidney failure, unspecified: Secondary | ICD-10-CM | POA: Diagnosis not present

## 2021-08-20 DIAGNOSIS — R1013 Epigastric pain: Secondary | ICD-10-CM | POA: Diagnosis not present

## 2021-08-20 DIAGNOSIS — R112 Nausea with vomiting, unspecified: Secondary | ICD-10-CM | POA: Diagnosis not present

## 2021-08-20 LAB — CBC WITH DIFFERENTIAL/PLATELET
Abs Immature Granulocytes: 0.03 10*3/uL (ref 0.00–0.07)
Basophils Absolute: 0 10*3/uL (ref 0.0–0.1)
Basophils Relative: 1 %
Eosinophils Absolute: 0.3 10*3/uL (ref 0.0–0.5)
Eosinophils Relative: 4 %
HCT: 35.1 % — ABNORMAL LOW (ref 36.0–46.0)
Hemoglobin: 12.1 g/dL (ref 12.0–15.0)
Immature Granulocytes: 0 %
Lymphocytes Relative: 23 %
Lymphs Abs: 1.8 10*3/uL (ref 0.7–4.0)
MCH: 31.3 pg (ref 26.0–34.0)
MCHC: 34.5 g/dL (ref 30.0–36.0)
MCV: 90.9 fL (ref 80.0–100.0)
Monocytes Absolute: 0.9 10*3/uL (ref 0.1–1.0)
Monocytes Relative: 12 %
Neutro Abs: 4.8 10*3/uL (ref 1.7–7.7)
Neutrophils Relative %: 60 %
Platelets: 158 10*3/uL (ref 150–400)
RBC: 3.86 MIL/uL — ABNORMAL LOW (ref 3.87–5.11)
RDW: 11.9 % (ref 11.5–15.5)
WBC: 7.9 10*3/uL (ref 4.0–10.5)
nRBC: 0 % (ref 0.0–0.2)

## 2021-08-20 LAB — MAGNESIUM: Magnesium: 1.7 mg/dL (ref 1.7–2.4)

## 2021-08-20 LAB — COMPREHENSIVE METABOLIC PANEL
ALT: 12 U/L (ref 0–44)
AST: 17 U/L (ref 15–41)
Albumin: 2.7 g/dL — ABNORMAL LOW (ref 3.5–5.0)
Alkaline Phosphatase: 46 U/L (ref 38–126)
Anion gap: 8 (ref 5–15)
BUN: 15 mg/dL (ref 6–20)
CO2: 27 mmol/L (ref 22–32)
Calcium: 8.5 mg/dL — ABNORMAL LOW (ref 8.9–10.3)
Chloride: 100 mmol/L (ref 98–111)
Creatinine, Ser: 1.38 mg/dL — ABNORMAL HIGH (ref 0.44–1.00)
GFR, Estimated: 50 mL/min — ABNORMAL LOW (ref >60)
Glucose, Bld: 119 mg/dL — ABNORMAL HIGH (ref 70–99)
Potassium: 4.2 mmol/L (ref 3.5–5.1)
Sodium: 135 mmol/L (ref 135–145)
Total Bilirubin: 0.4 mg/dL (ref 0.3–1.2)
Total Protein: 5.3 g/dL — ABNORMAL LOW (ref 6.5–8.1)

## 2021-08-20 LAB — C DIFFICILE (CDIFF) QUICK SCRN (NO PCR REFLEX)
C Diff antigen: POSITIVE — AB
C Diff toxin: NEGATIVE

## 2021-08-20 LAB — PHOSPHORUS: Phosphorus: 4.6 mg/dL (ref 2.5–4.6)

## 2021-08-20 MED ORDER — GUAIFENESIN ER 600 MG PO TB12
1200.0000 mg | ORAL_TABLET | Freq: Two times a day (BID) | ORAL | Status: DC
  Administered 2021-08-20 – 2021-08-21 (×3): 1200 mg via ORAL
  Filled 2021-08-20 (×3): qty 2

## 2021-08-20 MED ORDER — POTASSIUM CHLORIDE CRYS ER 20 MEQ PO TBCR
40.0000 meq | EXTENDED_RELEASE_TABLET | Freq: Once | ORAL | Status: AC
  Administered 2021-08-20: 40 meq via ORAL
  Filled 2021-08-20: qty 2

## 2021-08-20 MED ORDER — MAGNESIUM OXIDE -MG SUPPLEMENT 400 (240 MG) MG PO TABS
400.0000 mg | ORAL_TABLET | Freq: Once | ORAL | Status: AC
  Administered 2021-08-20: 400 mg via ORAL
  Filled 2021-08-20: qty 1

## 2021-08-20 MED ORDER — SODIUM CHLORIDE 0.9 % IV SOLN
3.0000 g | Freq: Three times a day (TID) | INTRAVENOUS | Status: DC
  Administered 2021-08-20 – 2021-08-21 (×3): 3 g via INTRAVENOUS
  Filled 2021-08-20 (×3): qty 8

## 2021-08-20 NOTE — Progress Notes (Signed)
Progress Note   Subjective  Chief Complaint: Diarrhea, nausea and vomiting  This morning, the patient tells me that she is feeling better, able to tolerate her diet for the most part but remains somewhat nauseous.  Thinks she has been getting Zofran and not Phenergan which she thinks helps her more.  Also tells me that she has had a productive cough over the past few days.  Has not had a bowel movement at all since I have ordered stool studies.   Objective   Vital signs in last 24 hours: Temp:  [98.3 F (36.8 C)-98.5 F (36.9 C)] 98.3 F (36.8 C) (05/21 0825) Pulse Rate:  [76-85] 76 (05/21 0825) Resp:  [15-19] 15 (05/21 0825) BP: (149-174)/(94-106) 149/94 (05/21 0825) SpO2:  [98 %-100 %] 100 % (05/21 0825) Last BM Date : 08/19/21 General:    white female in NAD Heart:  Regular rate and rhythm; no murmurs Lungs: Respirations even and unlabored, lungs CTA bilaterally Abdomen:  Soft, nontender and nondistended. Normal bowel sounds. Psych:  Cooperative. Normal mood and affect.  Lab Results: Recent Labs    08/18/21 0146 08/19/21 0146 08/20/21 0214  WBC 10.9* 8.7 7.9  HGB 12.6 12.8 12.1  HCT 38.4 38.9 35.1*  PLT 165 174 158   BMET Recent Labs    08/18/21 0146 08/19/21 0146 08/20/21 0214  NA 136 138 135  K 3.6 3.9 4.2  CL 105 103 100  CO2 '24 26 27  '$ GLUCOSE 94 88 119*  BUN '14 13 15  '$ CREATININE 1.31* 1.17* 1.38*  CALCIUM 8.3* 8.6* 8.5*   LFT Recent Labs    08/20/21 0214  PROT 5.3*  ALBUMIN 2.7*  AST 17  ALT 12  ALKPHOS 46  BILITOT 0.4   Studies/Results: CT ABDOMEN PELVIS W CONTRAST  Result Date: 08/19/2021 CLINICAL DATA:  Mid abdominal pain with diarrhea. EXAM: CT ABDOMEN AND PELVIS WITH CONTRAST TECHNIQUE: Multidetector CT imaging of the abdomen and pelvis was performed using the standard protocol following bolus administration of intravenous contrast. RADIATION DOSE REDUCTION: This exam was performed according to the departmental dose-optimization  program which includes automated exposure control, adjustment of the mA and/or kV according to patient size and/or use of iterative reconstruction technique. CONTRAST:  172m OMNIPAQUE IOHEXOL 300 MG/ML  SOLN COMPARISON:  CT renal stone 08/15/2021. FINDINGS: Lower chest: Patchy airspace and ill-defined nodular densities are seen in the left lower lobe, likely infectious/inflammatory. Hepatobiliary: No focal liver abnormality is seen. Status post cholecystectomy. No biliary dilatation. Pancreas: Unremarkable. No pancreatic ductal dilatation or surrounding inflammatory changes. Spleen: Normal in size without focal abnormality. Calcified granulomas are again seen. Adrenals/Urinary Tract: There are punctate nonobstructing bilateral renal calculi. Otherwise, kidneys, adrenal glands and bladder are within normal limits. Stomach/Bowel: Stomach is within normal limits. Appendix is not seen. No evidence of bowel wall thickening, distention, or inflammatory changes. Vascular/Lymphatic: No significant vascular findings are present. No enlarged abdominal or pelvic lymph nodes. Reproductive: Status post hysterectomy. No adnexal masses. Other: No abdominal wall hernia or abnormality. No abdominopelvic ascites. Musculoskeletal: No acute or significant osseous findings. IMPRESSION: 1. Patchy airspace and ill-defined nodular densities in the left lower lobe, likely infectious/inflammatory. 2. Punctate nonobstructing bilateral renal calculi. Electronically Signed   By: ARonney AstersM.D.   On: 08/19/2021 20:30       Assessment / Plan:   Assessment: 1.  Nausea and vomiting: CT renal stone study 5/16 with no acute findings, multiple 2-3 mm nonobstructive calculi in the collecting systems both kidneys,  abdominal x-ray 5/19 normal, EGD 5/17 with moderate gastritis, per patient nausea and vomiting is actually better and improved on antiemetics  2.  Diarrhea: No further diarrhea over the past 24 hours 3.  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  History of pituitary tumor/Addison's disease  Plan: 1.  Patient has had no further diarrhea overnight, so likely this is not infectious. 2.  Continue current diet 3.  Reviewed CT with the patient showing lung infection, she has had some productive coughing over the past few days and this will likely need treatment as per hospitalist team 4.  Would recommend patient use oral Phenergan as she feels this helps her better for nausea and she can be discharged with this. 5.  Again patient will need to follow-up with her primary GI team at [facility] at time of discharge. 6.  Continue on a PPI at discharge.  We will sign off, please let us know if we can be of any further assistance.    LOS: 5 days   Levin Erp  08/20/2021, 11:43 AM

## 2021-08-20 NOTE — Progress Notes (Signed)
Pharmacy Antibiotic Note  Brittany Mullins is a 41 y.o. female admitted on 08/15/2021 with nausea and vomiting.  Pharmacy has been consulted for Unasyn dosing for aspiration pneumonia.  WBC 19.5 on admit down to wnl, afebrile.   SCr 1.31>1.17>1.38  Unasyn 5/21>> CTX 5/16 x1  5/20 Cdiff ; agntigen + , toxin negative 5/20 GI PCR: negative  Plan: Unasyn 3 g IV q8h. Monitor clinical progress, renal function, cultures.     Height: '5\' 3"'$  (160 cm) Weight: 63.5 kg (139 lb 15.9 oz) IBW/kg (Calculated) : 52.4  Temp (24hrs), Avg:98.4 F (36.9 C), Min:98.2 F (36.8 C), Max:98.5 F (36.9 C)  Recent Labs  Lab 08/15/21 0819 08/15/21 2105 08/16/21 0046 08/17/21 0241 08/18/21 0146 08/19/21 0146 08/20/21 0214  WBC  --   --  11.2* 8.3 10.9* 8.7 7.9  CREATININE  --   --  2.32* 1.38* 1.31* 1.17* 1.38*  LATICACIDVEN 2.0* 1.1 0.7  --   --   --   --     Estimated Creatinine Clearance: 49.1 mL/min (A) (by C-G formula based on SCr of 1.38 mg/dL (H)).    Allergies  Allergen Reactions   Bee Venom Anaphylaxis   Toradol [Ketorolac Tromethamine] Shortness Of Breath   Benadryl [Diphenhydramine] Other (See Comments)    States she cannot breathe   Thank you for allowing pharmacy to be a part of this patient's care.  Nicole Cella, [place] Clinical Pharmacist [phone],  after [phone]  08/20/2021 12:51 PM

## 2021-08-20 NOTE — Progress Notes (Signed)
PROGRESS NOTE    Brittany Mullins  LKT:[id number] DOB: June 02, 1980 DOA: 08/15/2021 PCP: Brittany Hy, PA-C   Brief Narrative:  The patient is [name] with a past medical history significant for but limited to history of cyclical vomiting, history of chronic pain syndrome on methadone, seizure disorder, and THC use as well as other comorbidities including a pituitary tumor and PCOS and Addison's disease who presented with intractable nausea, vomiting and abdominal pain.  She takes methadone and not able to keep it down.  She states that she woke up sick a few days ago and this was the first time this happened to her.  She denies any diarrhea but she is described abdominal pain is midepigastric.  She states that she had some emesis that was coffee-ground in appearance and had 3-4 bloody episodes of this.  She indicated that she had a bleeding ulcer in childhood and has never seen a GI doctor afterwards.  She denies any bloody bowel movements and denies any NSAIDs or Goody powders.  In the ED she is found to have an AKI and she was having cyclical vomiting and difficulty tolerating p.o.  She is given IV fluid resuscitation and given 3 L of normal saline boluses and then now placed on maintenance IV fluids.  Given that her continued nausea and vomiting and concern for coffee-ground emesis GI has been consulted for further evaluation and recommendations.  GI took the patient for an EGD and it showed moderate gastritis that was biopsied. She was stared on a CLD and placed on Pantoprazole 40 mg po BID x6 weeks sand then 40 mg po Daily afterwards. She was also started on IV Reglan 10 mg q8h x 72 hours. Renal Fxn continues to Improve but still not normal.  Diet was advanced to full liquid yesterday and is now soft now.  She is still complaining of some nausea and now having significant amount of diarrhea so we will check her GI pathogen panel.  Continue with maintenance IV fluid  hydration again and now BUN/Cr is improved to 13/1.17.   She is to have significant amount of diarrhea and continues to be nauseous and states that she vomited.  Not able to really tolerate her soft diet.  We will have GI evaluate again.  Low suspicion for C. difficile given that she is afebrile and has no leukocytosis.  Her diarrhea could have been in the setting of laxatives that was superimposed on her acute viral gastroenteritis.   GI was consulted and checked for C. difficile and her antigen was positive and her toxin was negative and diarrhea is improved.  Is not C. difficile and GI recommended obtaining a CT scan of the abdomen pelvis which was unrevealing for her source of abdominal pain but did show some nephrolithiasis and did also show a left lower lobe pneumonia likely in setting of aspiration from her vomiting.  She has now been initiated on IV Unasyn.  Continues to have significant abdominal pain and was in tears this morning.  GI recommends continue current diet as tolerated and recommending using oral Phenergan as she feels better with this for her nausea and they feel that she can be discharged with this at home   Assessment and Plan:  Intractable Nausea/Vomiting associated with Abdominal Pain Acute Diarrhea Coffee Ground Emesis in the setting of Gastritis  -Patient presenting with recurrent n/v and abdominal pain -Has reported h/o Addison's disease but is not taking medications for this  -Has chronic pain,  on methadone - withdrawal is less likely with methadone since it is so long-acting but this is a consideration -May be related to cannabinoid hyperemesis syndrome, as she is continuing to use -She does have a Hx of Bleeding Gastric Ulcer as a child  -CT Renal Stone study showed "Multiple tiny 2-3 mm nonobstructive calculi are noted within the collecting systems of both kidneys. No additional calculi are noted along the course of either ureter or within  the lumen of the urinary  bladder. No hydroureteronephrosis. Unenhanced appearance of the kidneys, bilateral adrenal glands and urinary bladder is otherwise unremarkable."CT also showed "Unenhanced appearance of the stomach is normal. No pathologic dilatation of small bowel or colon. The appendix is not confidently identified and may be surgically absent. Regardless, there are no inflammatory changes noted adjacent to the cecum to suggest the presence of an acute appendicitis at this time." -Admitted to Telemetry  -Encourage marijuana cessation and a good bowel regimen as well as behavioral health support  -Elevated lactate is thought to be related to nv//dehydration and has trended down with IV fluid resuscitation and went from 2.0 and is now 0.7 -Lipase level was normal on admission and AST was mildly elevated but trended down to 36 -WBC went from 19.5 -> 11.2 -> 8.3 but is slightly elevated now 10.9 and is now further improving to 8.7 yesterday is now 7.9 today -GI has been consulted and planning for an EGD which showed moderate gastritis that was biopsied and a normal esophagus -Patient does have a history of chronic idiopathic pancreatitis with biliary stenting many years ago at [facility] but recent CT scan showed no evidence of retained stents and most recent CT scan showed no pancreatic abnormality; at [facility] in 2022 her CT showed prominent biliary and pancreatic ductal dilatation and MRCP was recommended but yet to be performed -She continues to have intractable nausea vomiting and she is on Zofran 4 mg p.o./IV every 6 as needed for nausea and also has a Phenergan 25 mg rectal suppository every 6 as needed for refractory nausea vomiting;  GI also added IV Metoclopramide 10 mg q8 x72 hours which is now complete  -Now GI recommending PPI BID x 6 weeks then po Pantoprazole 40 mg Daily  -She is having significant amount of diarrhea now so we will hold her laxatives and discontinue her scheduled bisacodyl and as needed  MiraLAX -Full liquid diet will be increased to a soft diet but not really able to tolerate it  -She is asking for Phenergan through the IV but this is on national back order and is caustic so we will initiate Compazine 10 mg IV Q6 as needed for nausea vomiting refractory nausea; she continues to have nausea despite being on Zofran Compazine and Metoclopramide -Check a GI pathogen panel but low clinical suspicion for C. difficile given that she has been on scheduled laxatives; GI Pathogen Panel is pending  -Checked KUB "The bowel gas pattern is normal. Surgical clips in the right upper quadrant. No radio-opaque calculi or other significant radiographic abnormality are seen." -She continues to have Nausea and Diarrhea GI check C. difficile and ova and parasites as well as a CT of the abdomen pelvis -C. difficile showed that she was C. difficile antigen positive toxin negative and diarrhea has improved so this not is C. difficile diarrhea and I will discontinue enteric precautions; states that she had 2 bowel movements in the last 24 hours -CT scan of the abdomen pelvis done and showed "  Stomach is within normal limits. Appendix is not seen. No evidence of bowel wall thickening, distention, or inflammatory changes." It also showed "Patchy airspace and ill-defined nodular densities in the left lower lobe, likely infectious/inflammatory.  Punctate nonobstructing bilateral renal calculi." -Ova and parasites is also being checked and pending -Further care per GI and GI felt that her abdomen pelvis CT was unrevealing for abdominal pain source.  Her diarrhea is improving and stool studies as above and so GI signed off the case and recommending outpatient follow-up with her primary physicians at [facility]  Left lower lobe pneumonia likely in the setting of aspiration from vomiting -She is afebrile and has no leukocytosis but continues to have a cough and states that she has some productive sputum now -We  will add flutter valve, incentive spirometry and guaifenesin to 1/2 twice daily -Initiate IV Unasyn for her suspected aspiration   AKI, improving  Metabolic acidosis -Patient's BUNs/creatinine on admission was 54/3.19 -> 48/2.32 -> 25/1.38 -> 14/1.31 -> 13/1.17 -> 15/1.38 -Likely pre-renal associated with intractable n/v -She is given multiple fluid boluses yesterday and then started on maintenance IV fluid hydration with LR which have now stopped and initiated normal saline at 75 mils per hour and will continue  -She did have a slight metabolic acidosis with a CO2 of 19, chloride level of 109, anion gap of 8; Now CO2 is 26, AG is 9, and Chloride Level is 103 -Avoid further nephrotoxic medications, contrast dyes, hypotension and dehydration to ensure adequate renal perfusion and renally adjust medications -Continue to monitor and trend renal function carefully and repeat CMP in the a.m.   Chronic Pain Syndrome -Dr. Lorin Mercy has reviewed this patient in the [place] Controlled Substances Reporting System.  There are no listed controlled substances since she obtains her methadone at a pain clinic. -Will resume home methadone at 120 mg p.o. daily -Oxycodone 5 mg p.o. every 4 as needed for moderate pain  -IV morphine 2 mg every 2 as needed severe pain as needed will be changed to every 4 as needed for breakthrough pain; with no plan for further escalation at this time -Phenergan given PR for breakthrough n/v -UDS was checked and was positive for THC   Hypocalcemia -Calcium is now 8.5 -Continue to Monitor and Trend    Elevated Blood Pressure/Accelerated Hypertension -In the setting of intractable nausea vomiting -Initiated hydralazine but increase to 10 mg q. for as needed for systolic blood pressure greater than 161 or diastolic blood pressure 096 -Continue monitor blood pressures per protocol -Last blood pressure reading was 165/99   Hyponatremia -In the setting of dehydration and nausea  vomiting -Improved with IV fluid hydration and sodium now 135 -Continue monitor and trend and repeat CMP in the a.m.   Hyperbilirubinemia -Likely reactive and improved -T. bili is trending downward from 1.6 -> 1.4 -> 1.3 -> 0.7 -> 0.6 -> 0.4 -Continue monitor and trend and repeat CMP in the a.m.   Hypomagnesemia -Mag level is now 1.7 again -Replete with po Mag Oxide 400 mg po x1  -Continue to monitor and replete as necessary -Repeat magnesium level in the a.m.   Hypokalemia -Mild and potassium is now 3.6 -Will give po Kcl 40 mEQ x1 -Continue monitor and replete as necessary -Repeat CMP in a.m.   History of Pituitary Tumor -He had a CT of the head back in July 2021 showed a partially empty sella and sinus wall thickening   Addison's disease -Not on any corticosteroids at  home   Hypoalbuminemia -Patient's albumin level is 2.8 x2 -> 3.0 -> 2.9 -> 2.7 -Continue monitor and trend and repeat CMP in the AM   DVT prophylaxis: Place and maintain sequential compression device Start: 08/20/21 1735 enoxaparin (LOVENOX) injection 40 mg Start: 08/18/21 1200    Code Status: Full Code Family Communication: No family present at bedside   Disposition Plan:  Level of care: Med-Surg Status is: Inpatient Remains inpatient appropriate because: Continues to have significant abdominal pain and now has a pneumonia.  Will need to ensure adequate p.o. intake and control pain prior to discharge   Consultants:  Gastroenterology  Procedures:  EGD Findings:      The entire esophagus was normal.      The Z-line was regular and was found 39 cm from the incisors.      Moderate inflammation characterized by congestion (edema) and erythema       was found in the entire examined stomach. There was small amounts of       scattered heme, but no ulcers or active bleeding noted. Biopsies were       taken with a cold forceps for Helicobacter pylori testing. Estimated       blood loss was minimal. There  was some retained food material in the       proximal stomach which decreased some of the visualization in the       proximal gastric body and fundus. This was lavaged with improved       visualization and no additional patholgy noted. The stomach was slightly       J shaped, but the pylorus was patent and easily traversed.      The examined duodenum was normal. Impression:               - Normal esophagus.                           - Z-line regular, 39 cm from the incisors.                           - Moderate gastritis. Biopsied.                           - Normal examined duodenum. Recommendation:           - Return patient to hospital ward for ongoing care.                           - Will restart clear liquids now and can advance                            diet as tolerated.                           - Continue present medications.                           - Use Protonix (pantoprazole) 40 mg PO BID for 6                            weeks to promote mucosal healing, then reduce to 40  mg daily and can titrate off if no additional GI                            symptoms.                           - Reglan 10 mg IV Q8 hours scheduled x72 hours.                            When symptoms improving, can change to PO prn.                           - Continue Zofran and Phenergan prn as prescribed.                           - Await pathology results.                           - Please do not hesitate to contact the inpatient                            GI service with additional questions or concerns.  CT Scan's as below  Antimicrobials:  Anti-infectives (From admission, onward)    Start     Dose/Rate Route Frequency Ordered Stop   08/20/21 1345  Ampicillin-Sulbactam (UNASYN) 3 g in sodium chloride 0.9 % 100 mL IVPB        3 g 200 mL/hr over 30 Minutes Intravenous Every 8 hours 08/20/21 1248     08/15/21 0545  cefTRIAXone (ROCEPHIN) 1 g in sodium chloride 0.9 %  100 mL IVPB        1 g 200 mL/hr over 30 Minutes Intravenous  Once 08/15/21 0535 08/15/21 0746       Subjective: Seen and examined at bedside today she was in tears secondary to pain of her abdomen.  States her nausea is improved and her diarrhea is essentially resolved.  Continues to have significant abdominal pain and still not really eating much but thankful that she has not vomited.  Feels that her nausea has definitely improved and states that oral Phenergan helps.  No other concerns or complaints at this time but is coughing and states that she is having somewhat productive sputum.  No other concerns or complaints at this time.  Objective: Vitals:   08/20/21 0918 08/20/21 0920 08/20/21 1225 08/20/21 1233  BP:   (!) 165/99   Pulse:   83 86  Resp: (!) '24 14 15 19  '$ Temp:   98.2 F (36.8 C)   TempSrc:   Oral   SpO2:   98% 100%  Weight:      Height:       No intake or output data in the 24 hours ending 08/20/21 1747 Filed Weights   08/15/21 0339 08/16/21 1143  Weight: 63.5 kg 63.5 kg   Examination: Physical Exam:  Constitutional: WN/WD Caucasian female currently no acute distress appears uncomfortable and in tears secondary to abdominal pain Respiratory: Diminished to auscultation bilaterally with coarse breath sounds worse on the left compared to the right and some slight rhonchi., no wheezing, rales, or crackles. Normal respiratory effort and patient is not tachypenic. No accessory muscle use.  Unlabored breathing Cardiovascular: RRR, no murmurs / rubs / gallops. S1 and S2 auscultated.  Abdomen: Soft, tender to palpate, distended secondary body habitus. Bowel sounds positive.  GU: Deferred. Musculoskeletal: No clubbing / cyanosis of digits/nails. No joint deformity upper and lower extremities.  Neurologic: CN 2-12 grossly intact with no focal deficits. Romberg sign and cerebellar reflexes not assessed.  Psychiatric: Normal judgment and insight. Alert and oriented x 3.  Anxious  and tearful  Data Reviewed: I have personally reviewed following labs and imaging studies  CBC: Recent Labs  Lab 08/15/21 0413 08/15/21 0425 08/16/21 0046 08/17/21 0241 08/18/21 0146 08/19/21 0146 08/20/21 0214  WBC 19.5*  --  11.2* 8.3 10.9* 8.7 7.9  NEUTROABS 17.2*  --   --  4.9 7.3 5.6 4.8  HGB 20.1*   < > 13.4 13.5 12.6 12.8 12.1  HCT 57.2*   < > 38.4 40.6 38.4 38.9 35.1*  MCV 89.1  --  90.6 92.7 93.9 92.6 90.9  PLT 276  --  179 173 165 174 158   < > = values in this interval not displayed.   Basic Metabolic Panel: Recent Labs  Lab 08/16/21 0046 08/17/21 0241 08/18/21 0146 08/19/21 0146 08/20/21 0214  NA 136 136 136 138 135  K 3.3* 3.7 3.6 3.9 4.2  CL 109 107 105 103 100  CO2 19* '23 24 26 27  '$ GLUCOSE 64* 75 94 88 119*  BUN 48* 25* '14 13 15  '$ CREATININE 2.32* 1.38* 1.31* 1.17* 1.38*  CALCIUM 8.2* 8.2* 8.3* 8.6* 8.5*  MG 2.0 1.9 1.6* 1.6* 1.7  PHOS  --  2.8 2.9 4.1 4.6   GFR: Estimated Creatinine Clearance: 49.1 mL/min (A) (by C-G formula based on SCr of 1.38 mg/dL (H)). Liver Function Tests: Recent Labs  Lab 08/16/21 0046 08/17/21 0241 08/18/21 0146 08/19/21 0146 08/20/21 0214  AST 36 32 '23 20 17  '$ ALT '25 19 18 16 12  '$ ALKPHOS 39 38 35* 39 46  BILITOT 1.4* 1.3* 0.7 0.6 0.4  PROT 5.2* 5.2* 5.5* 5.7* 5.3*  ALBUMIN 2.8* 2.8* 3.0* 2.9* 2.7*   Recent Labs  Lab 08/15/21 0413  LIPASE 22   No results for input(s): AMMONIA in the last 168 hours. Coagulation Profile: No results for input(s): INR, PROTIME in the last 168 hours. Cardiac Enzymes: No results for input(s): CKTOTAL, CKMB, CKMBINDEX, TROPONINI in the last 168 hours. BNP (last 3 results) No results for input(s): PROBNP in the last 8760 hours. HbA1C: No results for input(s): HGBA1C in the last 72 hours. CBG: No results for input(s): GLUCAP in the last 168 hours. Lipid Profile: No results for input(s): CHOL, HDL, LDLCALC, TRIG, CHOLHDL, LDLDIRECT in the last 72 hours. Thyroid Function  Tests: No results for input(s): TSH, T4TOTAL, FREET4, T3FREE, THYROIDAB in the last 72 hours. Anemia Panel: No results for input(s): VITAMINB12, FOLATE, FERRITIN, TIBC, IRON, RETICCTPCT in the last 72 hours. Sepsis Labs: Recent Labs  Lab 08/15/21 0819 08/15/21 2105 08/16/21 0046  LATICACIDVEN 2.0* 1.1 0.7    Recent Results (from the past 240 hour(s))  Gastrointestinal Panel by PCR , Stool     Status: None   Collection Time: 08/19/21  4:22 AM   Specimen: Stool  Result Value Ref Range Status   Campylobacter species NOT DETECTED NOT DETECTED Final   Plesimonas shigelloides NOT DETECTED NOT DETECTED Final   Salmonella species NOT DETECTED NOT DETECTED Final   Yersinia enterocolitica NOT DETECTED NOT DETECTED Final   Vibrio species NOT DETECTED NOT DETECTED  Final   Vibrio cholerae NOT DETECTED NOT DETECTED Final   Enteroaggregative E coli (EAEC) NOT DETECTED NOT DETECTED Final   Enteropathogenic E coli (EPEC) NOT DETECTED NOT DETECTED Final   Enterotoxigenic E coli (ETEC) NOT DETECTED NOT DETECTED Final   Shiga like toxin producing E coli (STEC) NOT DETECTED NOT DETECTED Final   Shigella/Enteroinvasive E coli (EIEC) NOT DETECTED NOT DETECTED Final   Cryptosporidium NOT DETECTED NOT DETECTED Final   Cyclospora cayetanensis NOT DETECTED NOT DETECTED Final   Entamoeba histolytica NOT DETECTED NOT DETECTED Final   Giardia lamblia NOT DETECTED NOT DETECTED Final   Adenovirus F40/41 NOT DETECTED NOT DETECTED Final   Astrovirus NOT DETECTED NOT DETECTED Final   Norovirus GI/GII NOT DETECTED NOT DETECTED Final   Rotavirus A NOT DETECTED NOT DETECTED Final   Sapovirus (I, II, IV, and V) NOT DETECTED NOT DETECTED Final    Comment: Performed at [facility], [address] [place], [place] [address]  C Difficile Quick Screen (NO PCR Reflex)     Status: Abnormal   Collection Time: 08/19/21  2:04 PM   Specimen: STOOL  Result Value Ref Range Status   C Diff antigen POSITIVE (A)  NEGATIVE Final   C Diff toxin NEGATIVE NEGATIVE Final   C Diff interpretation   Final    Results are indeterminate. Please contact the provider listed for your campus for C diff questions in [place].    Comment: Performed at [facility], [place] [address] [place], [place] [address]    Radiology Studies: CT ABDOMEN PELVIS W CONTRAST  Result Date: 08/19/2021 CLINICAL DATA:  Mid abdominal pain with diarrhea. EXAM: CT ABDOMEN AND PELVIS WITH CONTRAST TECHNIQUE: Multidetector CT imaging of the abdomen and pelvis was performed using the standard protocol following bolus administration of intravenous contrast. RADIATION DOSE REDUCTION: This exam was performed according to the departmental dose-optimization program which includes automated exposure control, adjustment of the mA and/or kV according to patient size and/or use of iterative reconstruction technique. CONTRAST:  162m OMNIPAQUE IOHEXOL 300 MG/ML  SOLN COMPARISON:  CT renal stone 08/15/2021. FINDINGS: Lower chest: Patchy airspace and ill-defined nodular densities are seen in the left lower lobe, likely infectious/inflammatory. Hepatobiliary: No focal liver abnormality is seen. Status post cholecystectomy. No biliary dilatation. Pancreas: Unremarkable. No pancreatic ductal dilatation or surrounding inflammatory changes. Spleen: Normal in size without focal abnormality. Calcified granulomas are again seen. Adrenals/Urinary Tract: There are punctate nonobstructing bilateral renal calculi. Otherwise, kidneys, adrenal glands and bladder are within normal limits. Stomach/Bowel: Stomach is within normal limits. Appendix is not seen. No evidence of bowel wall thickening, distention, or inflammatory changes. Vascular/Lymphatic: No significant vascular findings are present. No enlarged abdominal or pelvic lymph nodes. Reproductive: Status post hysterectomy. No adnexal masses. Other: No abdominal wall hernia or abnormality. No abdominopelvic ascites.  Musculoskeletal: No acute or significant osseous findings. IMPRESSION: 1. Patchy airspace and ill-defined nodular densities in the left lower lobe, likely infectious/inflammatory. 2. Punctate nonobstructing bilateral renal calculi. Electronically Signed   By: ARonney AstersM.D.   On: 08/19/2021 20:30     Scheduled Meds:  enoxaparin (LOVENOX) injection  40 mg Subcutaneous Q24H   feeding supplement  1 Container Oral TID BM   guaiFENesin  1,200 mg Oral BID   levonorgestrel-ethinyl estradiol  1 tablet Oral Daily   magnesium oxide  400 mg Oral Once   methadone  120 mg Oral Daily   pantoprazole  40 mg Oral BID   potassium chloride  40  mEq Oral Once   sodium chloride flush  3 mL Intravenous Q12H   Continuous Infusions:  ampicillin-sulbactam (UNASYN) IV 3 g (08/20/21 1403)    LOS: 5 days   Raiford Noble, DO [facility] Available via Epic secure chat 7am-7pm After these hours, please refer to coverage provider listed on [email or web address] 08/20/2021, 5:47 PM

## 2021-08-21 ENCOUNTER — Inpatient Hospital Stay (HOSPITAL_COMMUNITY): Payer: Medicare Other

## 2021-08-21 DIAGNOSIS — K2970 Gastritis, unspecified, without bleeding: Secondary | ICD-10-CM | POA: Diagnosis not present

## 2021-08-21 DIAGNOSIS — R112 Nausea with vomiting, unspecified: Secondary | ICD-10-CM | POA: Diagnosis not present

## 2021-08-21 DIAGNOSIS — N179 Acute kidney failure, unspecified: Secondary | ICD-10-CM | POA: Diagnosis not present

## 2021-08-21 DIAGNOSIS — R1013 Epigastric pain: Secondary | ICD-10-CM | POA: Diagnosis not present

## 2021-08-21 LAB — CBC WITH DIFFERENTIAL/PLATELET
Abs Immature Granulocytes: 0.02 10*3/uL (ref 0.00–0.07)
Basophils Absolute: 0 10*3/uL (ref 0.0–0.1)
Basophils Relative: 1 %
Eosinophils Absolute: 0.2 10*3/uL (ref 0.0–0.5)
Eosinophils Relative: 3 %
HCT: 38.6 % (ref 36.0–46.0)
Hemoglobin: 13.4 g/dL (ref 12.0–15.0)
Immature Granulocytes: 0 %
Lymphocytes Relative: 34 %
Lymphs Abs: 2.4 10*3/uL (ref 0.7–4.0)
MCH: 31.3 pg (ref 26.0–34.0)
MCHC: 34.7 g/dL (ref 30.0–36.0)
MCV: 90.2 fL (ref 80.0–100.0)
Monocytes Absolute: 0.7 10*3/uL (ref 0.1–1.0)
Monocytes Relative: 10 %
Neutro Abs: 3.6 10*3/uL (ref 1.7–7.7)
Neutrophils Relative %: 52 %
Platelets: 209 10*3/uL (ref 150–400)
RBC: 4.28 MIL/uL (ref 3.87–5.11)
RDW: 11.9 % (ref 11.5–15.5)
WBC: 7 10*3/uL (ref 4.0–10.5)
nRBC: 0 % (ref 0.0–0.2)

## 2021-08-21 LAB — COMPREHENSIVE METABOLIC PANEL
ALT: 15 U/L (ref 0–44)
AST: 23 U/L (ref 15–41)
Albumin: 3.2 g/dL — ABNORMAL LOW (ref 3.5–5.0)
Alkaline Phosphatase: 49 U/L (ref 38–126)
Anion gap: 11 (ref 5–15)
BUN: 17 mg/dL (ref 6–20)
CO2: 27 mmol/L (ref 22–32)
Calcium: 9.3 mg/dL (ref 8.9–10.3)
Chloride: 99 mmol/L (ref 98–111)
Creatinine, Ser: 1.24 mg/dL — ABNORMAL HIGH (ref 0.44–1.00)
GFR, Estimated: 57 mL/min — ABNORMAL LOW (ref >60)
Glucose, Bld: 109 mg/dL — ABNORMAL HIGH (ref 70–99)
Potassium: 4.6 mmol/L (ref 3.5–5.1)
Sodium: 137 mmol/L (ref 135–145)
Total Bilirubin: 0.4 mg/dL (ref 0.3–1.2)
Total Protein: 6.3 g/dL — ABNORMAL LOW (ref 6.5–8.1)

## 2021-08-21 LAB — PHOSPHORUS: Phosphorus: 4.4 mg/dL (ref 2.5–4.6)

## 2021-08-21 LAB — MAGNESIUM: Magnesium: 1.7 mg/dL (ref 1.7–2.4)

## 2021-08-21 MED ORDER — GUAIFENESIN ER 600 MG PO TB12
600.0000 mg | ORAL_TABLET | Freq: Two times a day (BID) | ORAL | 0 refills | Status: AC
Start: 2021-08-21 — End: 2021-08-26

## 2021-08-21 MED ORDER — ONDANSETRON HCL 4 MG PO TABS: 4.0000 mg | ORAL_TABLET | Freq: Four times a day (QID) | ORAL | 0 refills | Status: DC | PRN

## 2021-08-21 MED ORDER — AMOXICILLIN-POT CLAVULANATE 875-125 MG PO TABS
1.0000 | ORAL_TABLET | Freq: Two times a day (BID) | ORAL | 0 refills | Status: AC
Start: 2021-08-21 — End: 2021-08-25

## 2021-08-21 MED ORDER — ACETAMINOPHEN 325 MG PO TABS: 650.0000 mg | ORAL_TABLET | Freq: Four times a day (QID) | ORAL | 0 refills | Status: DC | PRN

## 2021-08-21 MED ORDER — PANTOPRAZOLE SODIUM 40 MG PO TBEC: 40.0000 mg | DELAYED_RELEASE_TABLET | Freq: Two times a day (BID) | ORAL | 0 refills | Status: DC

## 2021-08-21 MED ORDER — PROMETHAZINE HCL 25 MG PO TABS: 25.0000 mg | ORAL_TABLET | Freq: Three times a day (TID) | ORAL | 0 refills | Status: DC | PRN

## 2021-08-21 NOTE — Discharge Summary (Signed)
Physician Discharge Summary   Patient: Brittany Mullins MRN: [id number] DOB: May 31, 1980  Admit date:     08/15/2021  Discharge date: 08/21/21  Discharge Physician: Raiford Noble, DO   PCP: Julian Hy, PA-C   Recommendations at discharge:   Follow up with PCP within 1-2 weeks and repeat CBC, CMP, Mag, Phos within 1 week Follow up with Gastroenterology in the outpatient within 1-2 weeks Follow up with [facility] at D/C   Discharge Diagnoses: Principal Problem:   Intractable nausea and vomiting Active Problems:   Opioid dependence ([place])   Marijuana use   AKI (acute kidney injury) ([place])   Abdominal pain, epigastric   Gastritis and gastroduodenitis  Resolved Problems:   * No resolved hospital problems. [facility] Ps Course: The patient is [name] with a past medical history significant for but limited to history of cyclical vomiting, history of chronic pain syndrome on methadone, seizure disorder, and THC use as well as other comorbidities including a pituitary tumor and PCOS and Addison's disease who presented with intractable nausea, vomiting and abdominal pain.  She takes methadone and not able to keep it down.  She states that she woke up sick a few days ago and this was the first time this happened to her.  She denies any diarrhea but she is described abdominal pain is midepigastric.  She states that she had some emesis that was coffee-ground in appearance and had 3-4 bloody episodes of this.  She indicated that she had a bleeding ulcer in childhood and has never seen a GI doctor afterwards.  She denies any bloody bowel movements and denies any NSAIDs or Goody powders.  In the ED she is found to have an AKI and she was having cyclical vomiting and difficulty tolerating p.o.  She is given IV fluid resuscitation and given 3 L of normal saline boluses and then now placed on maintenance IV fluids.  Given that her continued nausea and vomiting and concern  for coffee-ground emesis GI has been consulted for further evaluation and recommendations.  GI took the patient for an EGD and it showed moderate gastritis that was biopsied. She was stared on a CLD and placed on Pantoprazole 40 mg po BID x6 weeks sand then 40 mg po Daily afterwards. She was also started on IV Reglan 10 mg q8h x 72 hours. Renal Fxn continues to Improve but still not normal.  Diet was advanced to full liquid yesterday and is now soft now.  She is still complaining of some nausea and now having significant amount of diarrhea so we will check her GI pathogen panel.  Continue with maintenance IV fluid hydration again and now BUN/Cr is improved to 13/1.17.   She is to have significant amount of diarrhea and continues to be nauseous and states that she vomited.  Not able to really tolerate her soft diet.  We will have GI evaluate again.  Low suspicion for C. difficile given that she is afebrile and has no leukocytosis.  Her diarrhea could have been in the setting of laxatives that was superimposed on her acute viral gastroenteritis.   GI was consulted and checked for C. difficile and her antigen was positive and her toxin was negative and diarrhea is improved.  Is not C. difficile and GI recommended obtaining a CT scan of the abdomen pelvis which was unrevealing for her source of abdominal pain but did show some nephrolithiasis and did also show a left lower lobe pneumonia likely in  setting of aspiration from her vomiting.  She has now been initiated on IV Unasyn.  Continues to have significant abdominal pain and was in tears this morning.  GI recommends continue current diet as tolerated and recommending using oral Phenergan as she feels better with this for her nausea and they feel that she can be discharged with this at home  He is much improved and her Unasyn was changed to Augmentin for 5 days total.  She is medically stable to be discharged home given her improvement and will follow up  with her PCP as well as gastroenterology in outpatient setting  Assessment and Plan:  Intractable Nausea/Vomiting associated with Abdominal Pain Acute Diarrhea Coffee Ground Emesis in the setting of Gastritis  -Patient presenting with recurrent n/v and abdominal pain -Has reported h/o Addison's disease but is not taking medications for this  -Has chronic pain, on methadone - withdrawal is less likely with methadone since it is so long-acting but this is a consideration -May be related to cannabinoid hyperemesis syndrome, as she is continuing to use -She does have a Hx of Bleeding Gastric Ulcer as a child  -CT Renal Stone study showed "Multiple tiny 2-3 mm nonobstructive calculi are noted within the collecting systems of both kidneys. No additional calculi are noted along the course of either ureter or within  the lumen of the urinary bladder. No hydroureteronephrosis. Unenhanced appearance of the kidneys, bilateral adrenal glands and urinary bladder is otherwise unremarkable."CT also showed "Unenhanced appearance of the stomach is normal. No pathologic dilatation of small bowel or colon. The appendix is not confidently identified and may be surgically absent. Regardless, there are no inflammatory changes noted adjacent to the cecum to suggest the presence of an acute appendicitis at this time." -Admitted to Telemetry  -Encourage marijuana cessation and a good bowel regimen as well as behavioral health support  -Elevated lactate is thought to be related to nv//dehydration and has trended down with IV fluid resuscitation and went from 2.0 and is now 0.7 -Lipase level was normal on admission and AST was mildly elevated but trended down to 36 -WBC went from 19.5 -> 11.2 -> 8.3 but is slightly elevated now 10.9 and is now further improving to 8.7 the day before yesterday is now 7.9 yesterday and today is 7.0 at the time of discharge -GI has been consulted and planning for an EGD which showed  moderate gastritis that was biopsied and a normal esophagus -Patient does have a history of chronic idiopathic pancreatitis with biliary stenting many years ago at [facility] but recent CT scan showed no evidence of retained stents and most recent CT scan showed no pancreatic abnormality; at [facility] in 2022 her CT showed prominent biliary and pancreatic ductal dilatation and MRCP was recommended but yet to be performed -She continues to have intractable nausea vomiting and she is on Zofran 4 mg p.o./IV every 6 as needed for nausea and also has a Phenergan 25 mg rectal suppository every 6 as needed for refractory nausea vomiting;  GI also added IV Metoclopramide 10 mg q8 x72 hours which is now complete  -Now GI recommending PPI BID x 6 weeks then po Pantoprazole 40 mg Daily  -She is having significant amount of diarrhea now so we will hold her laxatives and discontinue her scheduled bisacodyl and as needed MiraLAX -Full liquid diet will be increased to a soft diet but not really able to tolerate it  -She is asking for Phenergan through the IV but  this is on national back order and is caustic so we will initiate Compazine 10 mg IV Q6 as needed for nausea vomiting refractory nausea; she continues to have nausea despite being on Zofran Compazine and Metoclopramide -Check a GI pathogen panel but low clinical suspicion for C. difficile given that she has been on scheduled laxatives; GI Pathogen Panel is pending  -Checked KUB "The bowel gas pattern is normal. Surgical clips in the right upper quadrant. No radio-opaque calculi or other significant radiographic abnormality are seen." -She continues to have Nausea and Diarrhea GI check C. difficile and ova and parasites as well as a CT of the abdomen pelvis -C. difficile showed that she was C. difficile antigen positive toxin negative and diarrhea has improved so this not is C. difficile diarrhea and I will discontinue enteric precautions; states that she had 2  bowel movements in the last 24 hours -CT scan of the abdomen pelvis done and showed "Stomach is within normal limits. Appendix is not seen. No evidence of bowel wall thickening, distention, or inflammatory changes." It also showed "Patchy airspace and ill-defined nodular densities in the left lower lobe, likely infectious/inflammatory.  Punctate nonobstructing bilateral renal calculi." -Ova and parasites is also being checked and showed "No ova, cysts, or parasites seen. One negative specimen does not rule out the possibility of a parasitic infection." -Further care per GI and GI felt that her abdomen pelvis CT was unrevealing for abdominal pain source.  Her diarrhea is improving and stool studies as above and so GI signed off the case and recommending outpatient follow-up with her primary physicians at [facility] -She is medically stable to be discharged   Left lower lobe pneumonia likely in the setting of aspiration from vomiting -She is afebrile and has no leukocytosis but continues to have a cough and states that she has some productive sputum now -We will add flutter valve, incentive spirometry and guaifenesin to 1/2 twice daily -Initiate IV Unasyn for her suspected aspiration and changed to p.o. Augmentin for 5 days   AKI, improving  Metabolic acidosis -Patient's BUNs/creatinine on admission was 54/3.19 -> 48/2.32 -> 25/1.38 -> 14/1.31 -> 13/1.17 -> 15/1.38 and is now 17/1.24 -Likely pre-renal associated with intractable n/v -She is given multiple fluid boluses yesterday and then started on maintenance IV fluid hydration with LR which have now stopped and initiated normal saline at 75 mils per hour and will continue  -She did have a slight metabolic acidosis with a CO2 of 19, chloride level of 109, anion gap of 8; Now CO2 is 26, AG is 9, and Chloride Level is 103 -Avoid further nephrotoxic medications, contrast dyes, hypotension and dehydration to ensure adequate renal perfusion and  renally adjust medications -Continue to monitor and trend renal function carefully and repeat CMP in the a.m.   Chronic Pain Syndrome -Dr. Lorin Mercy has reviewed this patient in the [place] Controlled Substances Reporting System.  There are no listed controlled substances since she obtains her methadone at a pain clinic. -Will resume home methadone at 120 mg p.o. daily -Oxycodone 5 mg p.o. every 4 as needed for moderate pain  -IV morphine 2 mg every 2 as needed severe pain as needed will be changed to every 4 as needed for breakthrough pain; with no plan for further escalation at this time -Phenergan given PR for breakthrough n/v -UDS was checked and was positive for THC   Hypocalcemia -Calcium is now 9.3 -Continue to Monitor and Trend    Elevated Blood  Pressure/Accelerated Hypertension -In the setting of intractable nausea vomiting -Initiated hydralazine but increase to 10 mg q. for as needed for systolic blood pressure greater than 448 or diastolic blood pressure 185 -Continue monitor blood pressures per protocol -Last blood pressure reading was 150/93   Hyponatremia -In the setting of dehydration and nausea vomiting -Improved with IV fluid hydration and sodium now 135 -Continue monitor and trend and repeat CMP in the a.m.   Hyperbilirubinemia -Likely reactive and improved -T. bili is trending downward from 1.6 -> 1.4 -> 1.3 -> 0.7 -> 0.6 -> 0.4 x2 -Continue monitor and trend and repeat CMP in the a.m.   Hypomagnesemia -Mag level is now 1.7 again -Replete with po Mag Oxide 400 mg po x1 prior to D/C  -Continue to monitor and replete as necessary -Repeat magnesium level in the a.m.   Hypokalemia -Mild and potassium is now 4.6 -Continue monitor and replete as necessary -Repeat CMP in a.m.   History of Pituitary Tumor -He had a CT of the head back in July 2021 showed a partially empty sella and sinus wall thickening   Addison's disease -Not on any corticosteroids at home -Follow  up with PCP    Hypoalbuminemia -Patient's albumin level is 2.8 x2 -> 3.0 -> 2.9 -> 2.7 -> 3.2 -Continue monitor and trend and repeat CMP in the AM   Nutrition Documentation    [place] ED to Hosp-Admission (Discharged) from 08/15/2021 in [place]  Nutrition Problem Inadequate oral intake  Etiology nausea  Nutrition Goal Patient will meet greater than or equal to 90% of their needs  Interventions Boost Breeze      Pain control - [facility] Controlled Substance Reporting System database was reviewed. and patient was instructed, not to drive, operate heavy machinery, perform activities at heights, swimming or participation in water activities or provide baby-sitting services while on Pain, Sleep and Anxiety Medications; until their outpatient Physician has advised to do so again. Also recommended to not to take more than prescribed Pain, Sleep and Anxiety Medications.   Consultants: Gastroenterology  Procedures performed: EGD   Disposition: Home Diet recommendation:  Discharge Diet Orders (From admission, onward)     Start     Ordered   08/21/21 0000  Diet - low sodium heart healthy       Comments: Soft Diet   08/21/21 1005           Cardiac diet DISCHARGE MEDICATION: Allergies as of 08/21/2021       Reactions   Bee Venom Anaphylaxis   Toradol [ketorolac Tromethamine] Shortness Of Breath   Benadryl [diphenhydramine] Other (See Comments)   States she cannot breathe        Medication List     TAKE these medications    acetaminophen 325 MG tablet Commonly known as: TYLENOL Take 2 tablets (650 mg total) by mouth every 6 (six) hours as needed for mild pain (or Fever >/= 101).   amoxicillin-clavulanate 875-125 MG tablet Commonly known as: AUGMENTIN Take 1 tablet by mouth 2 (two) times daily for 4 days.   guaiFENesin 600 MG 12 hr tablet Commonly known as: MUCINEX Take 1 tablet (600 mg total) by mouth 2 (two) times  daily for 5 days.   levonorgestrel-ethinyl estradiol 0.1-20 MG-MCG tablet Commonly known as: ALESSE Take 1 tablet by mouth daily.   methadone 10 MG/ML solution Commonly known as: DOLOPHINE Take 120 mg by mouth daily.   ondansetron 4 MG tablet Commonly known  as: ZOFRAN Take 1 tablet (4 mg total) by mouth every 6 (six) hours as needed for nausea.   pantoprazole 40 MG tablet Commonly known as: PROTONIX Take 1 tablet (40 mg total) by mouth 2 (two) times daily.   promethazine 25 MG tablet Commonly known as: PHENERGAN Take 1 tablet (25 mg total) by mouth every 8 (eight) hours as needed for nausea or vomiting. Use only if Ondansetron is not effective What changed:  medication strength how much to take additional instructions        Follow-up Information     Bujanowski, Melissa, PA-C Follow up.   Specialty: Physician Assistant Contact information: [place]  [place] [address] [phone]                Discharge Exam: Filed Weights   08/15/21 0339 08/16/21 1143  Weight: 63.5 kg 63.5 kg   Vitals:   08/21/21 0515 08/21/21 0756  BP: 128/81 (!) 150/93  Pulse: 90 93  Resp: 16 20  Temp: 98 F (36.7 C) 98.2 F (36.8 C)  SpO2: 92% 99%   Examination: Physical Exam:  Constitutional: WN/WD Caucasian female in NAD Respiratory: Diminished to auscultation bilaterally, no wheezing, rales, rhonchi or crackles. Normal respiratory effort and patient is not tachypenic. No accessory muscle use. Unlabored breathing  Cardiovascular: RRR, no murmurs / rubs / gallops. S1 and S2 auscultated. No extremity edema. Abdomen: Soft, mildly-tender, Distended 2/2 body habitus. Bowel sounds positive.  GU: Deferred. Musculoskeletal: No clubbing / cyanosis of digits/nails. No joint deformity upper and lower extremities. Skin: No rashes, lesions, ulcers on a limited skin eval. No induration; Warm and dry.  Neurologic: CN 2-12 grossly intact with no focal deficits. Romberg  sign and cerebellar reflexes not assessed.  Psychiatric: Normal judgment and insight. Alert and oriented x 3. Normal mood and appropriate affect.   Condition at discharge: stable  The results of significant diagnostics from this hospitalization (including imaging, microbiology, ancillary and laboratory) are listed below for reference.   Imaging Studies: DG Abd 1 View  Result Date: 08/18/2021 CLINICAL DATA:  Abdominal pain midline abdominal pain with nausea vomiting and diarrhea for 4 days. EXAM: ABDOMEN - 1 VIEW COMPARISON:  None Available. FINDINGS: The bowel gas pattern is normal. Surgical clips in the right upper quadrant. No radio-opaque calculi or other significant radiographic abnormality are seen. IMPRESSION: Negative. Electronically Signed   By: Keane Police D.O.   On: 08/18/2021 11:17   CT ABDOMEN PELVIS W CONTRAST  Result Date: 08/19/2021 CLINICAL DATA:  Mid abdominal pain with diarrhea. EXAM: CT ABDOMEN AND PELVIS WITH CONTRAST TECHNIQUE: Multidetector CT imaging of the abdomen and pelvis was performed using the standard protocol following bolus administration of intravenous contrast. RADIATION DOSE REDUCTION: This exam was performed according to the departmental dose-optimization program which includes automated exposure control, adjustment of the mA and/or kV according to patient size and/or use of iterative reconstruction technique. CONTRAST:  117m OMNIPAQUE IOHEXOL 300 MG/ML  SOLN COMPARISON:  CT renal stone 08/15/2021. FINDINGS: Lower chest: Patchy airspace and ill-defined nodular densities are seen in the left lower lobe, likely infectious/inflammatory. Hepatobiliary: No focal liver abnormality is seen. Status post cholecystectomy. No biliary dilatation. Pancreas: Unremarkable. No pancreatic ductal dilatation or surrounding inflammatory changes. Spleen: Normal in size without focal abnormality. Calcified granulomas are again seen. Adrenals/Urinary Tract: There are punctate  nonobstructing bilateral renal calculi. Otherwise, kidneys, adrenal glands and bladder are within normal limits. Stomach/Bowel: Stomach is within normal limits. Appendix is not seen. No evidence of  bowel wall thickening, distention, or inflammatory changes. Vascular/Lymphatic: No significant vascular findings are present. No enlarged abdominal or pelvic lymph nodes. Reproductive: Status post hysterectomy. No adnexal masses. Other: No abdominal wall hernia or abnormality. No abdominopelvic ascites. Musculoskeletal: No acute or significant osseous findings. IMPRESSION: 1. Patchy airspace and ill-defined nodular densities in the left lower lobe, likely infectious/inflammatory. 2. Punctate nonobstructing bilateral renal calculi. Electronically Signed   By: Ronney Asters M.D.   On: 08/19/2021 20:30   DG CHEST PORT 1 VIEW  Result Date: 08/21/2021 CLINICAL DATA:  Shortness of breath EXAM: PORTABLE CHEST 1 VIEW COMPARISON:  October 19, 2020 FINDINGS: The heart size and mediastinal contours are within normal limits. No focal airspace consolidation. No pleural effusion or pneumothorax. The visualized skeletal structures are unremarkable. IMPRESSION: No acute cardiopulmonary disease. Electronically Signed   By: Dahlia Bailiff M.D.   On: 08/21/2021 08:02   CT Renal Stone Study  Result Date: 08/15/2021 CLINICAL DATA:  41 year old female with history of flank pain. EXAM: CT ABDOMEN AND PELVIS WITHOUT CONTRAST TECHNIQUE: Multidetector CT imaging of the abdomen and pelvis was performed following the standard protocol without IV contrast. RADIATION DOSE REDUCTION: This exam was performed according to the departmental dose-optimization program which includes automated exposure control, adjustment of the mA and/or kV according to patient size and/or use of iterative reconstruction technique. COMPARISON:  CT the abdomen and pelvis 10/25/2019. FINDINGS: Lower chest: Unremarkable. Hepatobiliary: No definite suspicious cystic or  solid hepatic lesions are confidently identified on today's noncontrast CT examination. Status post cholecystectomy. Pancreas: No definite pancreatic mass or peripancreatic fluid collections or inflammatory changes are noted on today's noncontrast CT examination. Spleen: Numerous calcified granulomas are noted in the spleen. Otherwise, unremarkable. Adrenals/Urinary Tract: Multiple tiny 2-3 mm nonobstructive calculi are noted within the collecting systems of both kidneys. No additional calculi are noted along the course of either ureter or within the lumen of the urinary bladder. No hydroureteronephrosis. Unenhanced appearance of the kidneys, bilateral adrenal glands and urinary bladder is otherwise unremarkable. Stomach/Bowel: Unenhanced appearance of the stomach is normal. No pathologic dilatation of small bowel or colon. The appendix is not confidently identified and may be surgically absent. Regardless, there are no inflammatory changes noted adjacent to the cecum to suggest the presence of an acute appendicitis at this time. Vascular/Lymphatic: No atherosclerotic calcifications are noted in the abdominal aorta or major pelvic vasculature. No lymphadenopathy noted in the abdomen or pelvis. Reproductive: Status post hysterectomy. Ovaries are unremarkable in appearance. Other: No significant volume of ascites.  No pneumoperitoneum. Musculoskeletal: There are no aggressive appearing lytic or blastic lesions noted in the visualized portions of the skeleton. IMPRESSION: 1. No acute findings are noted in the abdomen or pelvis to account for the patient's symptoms. 2. Multiple 2-3 mm nonobstructive calculi are noted within the collecting systems of both kidneys. No ureteral stones or findings of urinary tract obstruction are noted at this time. 3. Additional incidental findings, as above. Electronically Signed   By: Vinnie Langton M.D.   On: 08/15/2021 05:45    Microbiology: Results for orders placed or performed  during the hospital encounter of 08/15/21  Gastrointestinal Panel by PCR , Stool     Status: None   Collection Time: 08/19/21  4:22 AM   Specimen: Stool  Result Value Ref Range Status   Campylobacter species NOT DETECTED NOT DETECTED Final   Plesimonas shigelloides NOT DETECTED NOT DETECTED Final   Salmonella species NOT DETECTED NOT DETECTED Final   Yersinia enterocolitica NOT  DETECTED NOT DETECTED Final   Vibrio species NOT DETECTED NOT DETECTED Final   Vibrio cholerae NOT DETECTED NOT DETECTED Final   Enteroaggregative E coli (EAEC) NOT DETECTED NOT DETECTED Final   Enteropathogenic E coli (EPEC) NOT DETECTED NOT DETECTED Final   Enterotoxigenic E coli (ETEC) NOT DETECTED NOT DETECTED Final   Shiga like toxin producing E coli (STEC) NOT DETECTED NOT DETECTED Final   Shigella/Enteroinvasive E coli (EIEC) NOT DETECTED NOT DETECTED Final   Cryptosporidium NOT DETECTED NOT DETECTED Final   Cyclospora cayetanensis NOT DETECTED NOT DETECTED Final   Entamoeba histolytica NOT DETECTED NOT DETECTED Final   Giardia lamblia NOT DETECTED NOT DETECTED Final   Adenovirus F40/41 NOT DETECTED NOT DETECTED Final   Astrovirus NOT DETECTED NOT DETECTED Final   Norovirus GI/GII NOT DETECTED NOT DETECTED Final   Rotavirus A NOT DETECTED NOT DETECTED Final   Sapovirus (I, II, IV, and V) NOT DETECTED NOT DETECTED Final    Comment: Performed at [facility], [address] [place], [place] [address]  C Difficile Quick Screen (NO PCR Reflex)     Status: Abnormal   Collection Time: 08/19/21  2:04 PM   Specimen: STOOL  Result Value Ref Range Status   C Diff antigen POSITIVE (A) NEGATIVE Final   C Diff toxin NEGATIVE NEGATIVE Final   C Diff interpretation   Final    Results are indeterminate. Please contact the provider listed for your campus for C diff questions in [place].    Comment: Performed at [facility], [place] [address] [place], [place] [address]  OVA + PARASITE EXAM     Status:  None   Collection Time: 08/19/21  2:04 PM   Specimen: Stool  Result Value Ref Range Status   OVA + PARASITE EXAM Final report  Final    Comment: (NOTE) These results were obtained using wet preparation(s) and trichrome stained smear. This test does not include testing for Cryptosporidium parvum, Cyclospora, or Microsporidia. Performed At: [facility] [place], [place] [id number] Rush Farmer MD YI:[id number]    Source of Sample STOOL  Final    Comment: Performed at [facility], [place] [address] [place], [place] [address]   Labs: CBC: Recent Labs  Lab 08/17/21 0241 08/18/21 0146 08/19/21 0146 08/20/21 0214 08/21/21 0603  WBC 8.3 10.9* 8.7 7.9 7.0  NEUTROABS 4.9 7.3 5.6 4.8 3.6  HGB 13.5 12.6 12.8 12.1 13.4  HCT 40.6 38.4 38.9 35.1* 38.6  MCV 92.7 93.9 92.6 90.9 90.2  PLT 173 165 174 158 [id number]   Basic Metabolic Panel: Recent Labs  Lab 08/17/21 0241 08/18/21 0146 08/19/21 0146 08/20/21 0214 08/21/21 0322  NA 136 136 138 135 137  K 3.7 3.6 3.9 4.2 4.6  CL 107 105 103 100 99  CO2 '23 24 26 27 27  '$ GLUCOSE 75 94 88 119* 109*  BUN 25* '14 13 15 17  '$ CREATININE 1.38* 1.31* 1.17* 1.38* 1.24*  CALCIUM 8.2* 8.3* 8.6* 8.5* 9.3  MG 1.9 1.6* 1.6* 1.7 1.7  PHOS 2.8 2.9 4.1 4.6 4.4   Liver Function Tests: Recent Labs  Lab 08/17/21 0241 08/18/21 0146 08/19/21 0146 08/20/21 0214 08/21/21 0322  AST 32 '23 20 17 23  '$ ALT '19 18 16 12 15  '$ ALKPHOS 38 35* 39 46 49  BILITOT 1.3* 0.7 0.6 0.4 0.4  PROT 5.2* 5.5* 5.7* 5.3* 6.3*  ALBUMIN 2.8* 3.0* 2.9* 2.7* 3.2*   CBG: No results for input(s): GLUCAP  in the last 168 hours.  Discharge time spent: greater than 30 minutes.  Signed: Raiford Noble, DO [facility] 08/22/2021

## 2021-08-22 LAB — OVA + PARASITE EXAM

## 2021-08-22 LAB — O&P RESULT

## 2021-08-23 LAB — PANCREATIC ELASTASE, FECAL: Pancreatic Elastase-1, Stool: 318 ug Elast./g (ref >200)

## 2021-09-14 ENCOUNTER — Ambulatory Visit: Payer: Medicare Other | Admitting: Gastroenterology

## 2022-03-06 ENCOUNTER — Emergency Department (HOSPITAL_BASED_OUTPATIENT_CLINIC_OR_DEPARTMENT_OTHER): Payer: Medicare Other

## 2022-03-06 ENCOUNTER — Other Ambulatory Visit: Payer: Self-pay

## 2022-03-06 ENCOUNTER — Emergency Department (HOSPITAL_BASED_OUTPATIENT_CLINIC_OR_DEPARTMENT_OTHER)
Admission: EM | Admit: 2022-03-06 | Discharge: 2022-03-06 | Disposition: A | Discharge status: Home / Self Care | Payer: Medicare Other | Attending: Emergency Medicine | Admitting: Emergency Medicine

## 2022-03-06 ENCOUNTER — Encounter (HOSPITAL_BASED_OUTPATIENT_CLINIC_OR_DEPARTMENT_OTHER): Payer: Self-pay | Admitting: Emergency Medicine

## 2022-03-06 DIAGNOSIS — H5710 Ocular pain, unspecified eye: Secondary | ICD-10-CM | POA: Diagnosis not present

## 2022-03-06 DIAGNOSIS — R569 Unspecified convulsions: Secondary | ICD-10-CM | POA: Insufficient documentation

## 2022-03-06 LAB — CBC WITH DIFFERENTIAL/PLATELET
Abs Immature Granulocytes: 0.01 10*3/uL (ref 0.00–0.07)
Basophils Absolute: 0.1 10*3/uL (ref 0.0–0.1)
Basophils Relative: 1 %
Eosinophils Absolute: 0.2 10*3/uL (ref 0.0–0.5)
Eosinophils Relative: 4 %
HCT: 40.3 % (ref 36.0–46.0)
Hemoglobin: 13.6 g/dL (ref 12.0–15.0)
Immature Granulocytes: 0 %
Lymphocytes Relative: 32 %
Lymphs Abs: 1.9 10*3/uL (ref 0.7–4.0)
MCH: 30.6 pg (ref 26.0–34.0)
MCHC: 33.7 g/dL (ref 30.0–36.0)
MCV: 90.8 fL (ref 80.0–100.0)
Monocytes Absolute: 0.5 10*3/uL (ref 0.1–1.0)
Monocytes Relative: 8 %
Neutro Abs: 3.3 10*3/uL (ref 1.7–7.7)
Neutrophils Relative %: 55 %
Platelets: 222 10*3/uL (ref 150–400)
RBC: 4.44 MIL/uL (ref 3.87–5.11)
RDW: 11.9 % (ref 11.5–15.5)
WBC: 6 10*3/uL (ref 4.0–10.5)
nRBC: 0 % (ref 0.0–0.2)

## 2022-03-06 LAB — RAPID URINE DRUG SCREEN, HOSP PERFORMED
Amphetamines: NOT DETECTED
Barbiturates: NOT DETECTED
Benzodiazepines: NOT DETECTED
Cocaine: NOT DETECTED
Opiates: NOT DETECTED
Tetrahydrocannabinol: POSITIVE — AB

## 2022-03-06 LAB — COMPREHENSIVE METABOLIC PANEL
ALT: 10 U/L (ref 0–44)
AST: 22 U/L (ref 15–41)
Albumin: 4.4 g/dL (ref 3.5–5.0)
Alkaline Phosphatase: 47 U/L (ref 38–126)
Anion gap: 8 (ref 5–15)
BUN: 11 mg/dL (ref 6–20)
CO2: 28 mmol/L (ref 22–32)
Calcium: 9.1 mg/dL (ref 8.9–10.3)
Chloride: 101 mmol/L (ref 98–111)
Creatinine, Ser: 0.72 mg/dL (ref 0.44–1.00)
GFR, Estimated: >60 mL/min (ref >60)
Glucose, Bld: 83 mg/dL (ref 70–99)
Potassium: 4.1 mmol/L (ref 3.5–5.1)
Sodium: 137 mmol/L (ref 135–145)
Total Bilirubin: 0.5 mg/dL (ref 0.3–1.2)
Total Protein: 7.2 g/dL (ref 6.5–8.1)

## 2022-03-06 MED ORDER — LEVETIRACETAM 500 MG PO TABS
1500.0000 mg | ORAL_TABLET | Freq: Once | ORAL | Status: AC
  Administered 2022-03-06: 1500 mg via ORAL
  Filled 2022-03-06: qty 3

## 2022-03-06 MED ORDER — LEVETIRACETAM 500 MG PO TABS: 500.0000 mg | ORAL_TABLET | Freq: Two times a day (BID) | ORAL | 1 refills | Status: DC

## 2022-03-06 NOTE — ED Provider Notes (Signed)
I saw and evaluated the patient, reviewed the resident's note and I agree with the findings and plan.  Patient seen by me along with the family medicine resident.  Patient with a report of 3 seizures since Friday none today.  Seizures are brief have been seen by family members she was found outside on the first 1 shaking by family member.  Patient had 2 similar seizures the following day denies any memory of the events and remembers kind of feeling achy all over after the fact.  Patient has a history of pituitary tumor and her last follow-up was in 2019.  Chart review seems to raise some question of maybe seizure activity in the past slightly tried to refer her to [facility] neurology but patient was never seen.  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  Past surgical history significant for cholecystectomy pancreatic stent kidney stent abdominal hysterectomy appendectomy.  Patient admits to marijuana use denies any tobacco use.  Patient here basically normal focal exam but some question of a downward gaze visual field defect.  Patient will get CT head and labs.  We will probably talk to neurology about the situation.  Fortunately we do not have MRI here.  EKG Interpretation  Date/Time:  Tuesday March 06 2022 11:53:13 EST Ventricular Rate:  73 PR Interval:  164 QRS Duration: 84 QT Interval:  434 QTC Calculation: 478 R Axis:   97 Text Interpretation: Normal sinus rhythm Right atrial enlargement Rightward axis Pulmonary disease pattern Abnormal ECG When compared with ECG of 08-Nov-2020 19:02, No significant change was found Confirmed by Fredia Sorrow [id number] on 03/06/2022 12:00:25 PM          Fredia Sorrow, MD 03/06/22 1352

## 2022-03-06 NOTE — ED Triage Notes (Addendum)
Pt arrives to ED with c/o new onset seizures. She notes her first seizure occurred on 12/2 where she was found out side shaking by a family member. She decreases two similar seizures the following day. She denies memory of events and decreases herself feeling sore all over. Hx pituitary tumor, 2019 was her last f/u on tumor.

## 2022-03-06 NOTE — Discharge Instructions (Signed)
Ms. Kelsay,  We are giving you a dose of anti-seizure medicine here and will send you home on twice daily seizure medicine. We are also referring you to neurology, they will call you to make an appointment. Hopefully they can get you in sooner rather than later.  Pearla Dubonnet, MD

## 2022-03-06 NOTE — ED Provider Notes (Signed)
[facility] EMERGENCY DEPT Provider Note   CSN: [id number] Arrival date & time: 03/06/22  1139     History  Chief Complaint  Patient presents with   Seizures    Brittany Mullins is a 41 y.o. female.  Patient here after being sent over from her PCP office.  She has a remote history of a pituitary tumor of some sort that was treated with steroids but not surgery.  She reportedly developed new seizures last Friday when she took her dogs out and then was found by her son seizing in the front yard.  She reports having no recollection of the event.  She then had 2 similar episodes on Saturday when she seized for between 30 and 60 seconds at home.  She reports that these were witnessed by her family members and she stops seizing spontaneously.  This morning she awoke and felt "weird" that she cannot define this any further.  She made a same-day appointment with her PCP who sent her to the ED via EMS given concern for new seizure.  She reports that she has otherwise been in her usual state of health although notes that she has lost perhaps 40 pounds in the last month.  She says that she has been trying to lose weight "but not that hard." She denies ever being on antiepileptic medicines.  She reports that her only current medicines are methadone which is managed by [facility] and an oral combined contraceptive pill. Patient reports that she has never had a seizure before but chart review reveals documented history of seizures dating back to at least 2015.  Also documented are historical Addison's disease and bipolar disorder, though it does not appear she has been on medications for either of these recently.    Seizures      Home Medications Prior to Admission medications   Medication Sig Start Date End Date Taking? Authorizing Provider  acetaminophen (TYLENOL) 325 MG tablet Take 2 tablets (650 mg total) by mouth every 6 (six) hours as needed for mild pain (or Fever >/= 101).  08/21/21  Yes Sheikh, Omair Latif, DO  levETIRAcetam (KEPPRA) 500 MG tablet Take 1 tablet (500 mg total) by mouth 2 (two) times daily. 03/06/22  Yes Eppie Gibson, MD  levonorgestrel-ethinyl estradiol (ALESSE) 0.1-20 MG-MCG tablet Take 1 tablet by mouth daily.   Yes [provider]  methadone (DOLOPHINE) 10 MG/ML solution Take 120 mg by mouth daily.   Yes [provider]  ondansetron (ZOFRAN) 4 MG tablet Take 1 tablet (4 mg total) by mouth every 6 (six) hours as needed for nausea. 08/21/21  Yes Sheikh, Omair Latif, DO  pantoprazole (PROTONIX) 40 MG tablet Take 1 tablet (40 mg total) by mouth 2 (two) times daily. Patient not taking: Reported on 03/06/2022 08/21/21   Raiford Noble Latif, DO  promethazine (PHENERGAN) 25 MG tablet Take 1 tablet (25 mg total) by mouth every 8 (eight) hours as needed for nausea or vomiting. Use only if Ondansetron is not effective Patient not taking: Reported on 03/06/2022 08/21/21   Raiford Noble Latif, DO      Allergies    Bee venom, Toradol [ketorolac tromethamine], and Benadryl [diphenhydramine]    Review of Systems   Review of Systems  Constitutional:  Positive for unexpected weight change (she reports having lost 40lbs in the past month).  Eyes:  Positive for pain (pain when looking down and to the right) and visual disturbance.  Gastrointestinal:  Positive for abdominal pain.  Neurological:  Positive for seizures. Negative for dizziness and tremors.    Physical Exam Updated Vital Signs BP (!) 141/82   Pulse 65   Temp 98.7 F (37.1 C) (Temporal)   Resp 15   Ht '5\' 3"'$  (1.6 m)   Wt 65.8 kg   SpO2 97%   BMI 25.69 kg/m  Physical Exam Constitutional:      General: She is not in acute distress. Eyes:     Pupils: Pupils are equal, round, and reactive to light.  Cardiovascular:     Rate and Rhythm: Normal rate and regular rhythm.  Pulmonary:     Effort: Pulmonary effort is normal. No respiratory distress.  Abdominal:     Tenderness:  Tenderness: diffuse, mild.  Musculoskeletal:        General: No swelling or deformity.     Cervical back: Normal range of motion. No rigidity.  Neurological:     Mental Status: She is alert.     Comments: CN II-XII Intact. Loss of visual field in the lower R quadrant and pain reported when moving eyes that direction.  Speech is fluent, strength and sensation intact throughout.      ED Results / Procedures / Treatments   Labs (all labs ordered are listed, but only abnormal results are displayed) Labs Reviewed  CBC WITH DIFFERENTIAL/PLATELET  COMPREHENSIVE METABOLIC PANEL  RAPID URINE DRUG SCREEN, HOSP PERFORMED    EKG EKG Interpretation  Date/Time:  Tuesday March 06 2022 11:53:13 EST Ventricular Rate:  73 PR Interval:  164 QRS Duration: 84 QT Interval:  434 QTC Calculation: 478 R Axis:   97 Text Interpretation: Normal sinus rhythm Right atrial enlargement Rightward axis Pulmonary disease pattern Abnormal ECG When compared with ECG of 08-Nov-2020 19:02, No significant change was found Confirmed by Fredia Sorrow [phone]) on 03/06/2022 12:00:25 PM  Radiology CT HEAD WO CONTRAST (5MM)  Result Date: 03/06/2022 CLINICAL DATA:  New onset seizures EXAM: CT HEAD WITHOUT CONTRAST TECHNIQUE: Contiguous axial images were obtained from the base of the skull through the vertex without intravenous contrast. RADIATION DOSE REDUCTION: This exam was performed according to the departmental dose-optimization program which includes automated exposure control, adjustment of the mA and/or kV according to patient size and/or use of iterative reconstruction technique. COMPARISON:  10/26/2019 FINDINGS: Brain: No evidence of acute infarction, hemorrhage, mass, mass effect, or midline shift. No hydrocephalus or extra-axial fluid collection. Partial empty sella. Normal craniocervical junction. Vascular: No hyperdense vessel. Skull: Normal. Negative for fracture or focal lesion. Sinuses/Orbits: Mucosal  thickening in the bilateral frontal, bilateral ethmoid, and left sphenoid sinuses. No acute finding in the orbits. Other: The mastoid air cells are well aerated. IMPRESSION: No acute intracranial process. Electronically Signed   By: Merilyn Baba M.D.   On: 03/06/2022 13:38    Procedures Procedures   Medications Ordered in ED Medications  levETIRAcetam (KEPPRA) tablet 1,500 mg (has no administration in time range)    ED Course/ Medical Decision Making/ A&P                           Medical Decision Making Patient with question of new seizures. Given her history of pituitary tumor with poor follow-up, I am quite concerned for intracranial pathology. Especially given loss of visual field in the lower right quadrant and pain with extraocular movement down and to the right. CT Head ordered, if negative will need MRI Brain which would require transfer to [facility].   CT Head result  with no acute intracranial process--partial empty sella.  Care discussed with Dr. Leonel Ramsay from neurology.  Given the documented history of seizures in the chart less concern for treating this is a truly new seizure. Considered other causes for her seizures including Addisonian crisis, but electrolytes WNL. Also considered toxic ingestion, UDS pending.  Patient is hemodynamically stable and well-appearing at this time.  We will plan to load with Keppra and arrange for close outpatient follow-up with neurology.   Amount and/or Complexity of Data Reviewed Labs: ordered.    Final Clinical Impression(s) / ED Diagnoses Final diagnoses:  Seizure ([place])    Rx / DC Orders ED Discharge Orders          Ordered    levETIRAcetam (KEPPRA) 500 MG tablet  2 times daily        03/06/22 1520    Ambulatory referral to Neurology       Comments: An appointment is requested in approximately: 1 week Patient with question of new seizures. History of pituitary tumor   03/06/22 1520           Pearla Dubonnet, MD     Eppie Gibson, MD 03/06/22 1529    Fredia Sorrow, MD 03/07/22 [phone]

## 2022-06-30 ENCOUNTER — Encounter (HOSPITAL_COMMUNITY): Payer: Self-pay | Admitting: Emergency Medicine

## 2022-06-30 ENCOUNTER — Emergency Department (HOSPITAL_COMMUNITY)
Admission: EM | Admit: 2022-06-30 | Discharge: 2022-06-30 | Disposition: A | Discharge status: Home / Self Care | Payer: Medicare Other | Attending: Emergency Medicine | Admitting: Emergency Medicine

## 2022-06-30 DIAGNOSIS — I10 Essential (primary) hypertension: Secondary | ICD-10-CM | POA: Diagnosis not present

## 2022-06-30 DIAGNOSIS — Z1152 Encounter for screening for COVID-19: Secondary | ICD-10-CM | POA: Insufficient documentation

## 2022-06-30 DIAGNOSIS — H1012 Acute atopic conjunctivitis, left eye: Secondary | ICD-10-CM | POA: Insufficient documentation

## 2022-06-30 DIAGNOSIS — Z79899 Other long term (current) drug therapy: Secondary | ICD-10-CM | POA: Diagnosis not present

## 2022-06-30 DIAGNOSIS — H1032 Unspecified acute conjunctivitis, left eye: Secondary | ICD-10-CM

## 2022-06-30 LAB — RESP PANEL BY RT-PCR (RSV, FLU A&B, COVID)  RVPGX2
Influenza A by PCR: NEGATIVE
Influenza B by PCR: NEGATIVE
Resp Syncytial Virus by PCR: NEGATIVE
SARS Coronavirus 2 by RT PCR: NEGATIVE

## 2022-06-30 LAB — GROUP A STREP BY PCR: Group A Strep by PCR: NOT DETECTED

## 2022-06-30 MED ORDER — FLUORESCEIN SODIUM 1 MG OP STRP
1.0000 | ORAL_STRIP | Freq: Once | OPHTHALMIC | Status: DC
  Filled 2022-06-30: qty 1

## 2022-06-30 MED ORDER — ERYTHROMYCIN 5 MG/GM OP OINT: TOPICAL_OINTMENT | OPHTHALMIC | 0 refills | Status: DC

## 2022-06-30 NOTE — ED Triage Notes (Signed)
Pt has redness and drainage to eyes post URI symptoms. No vision issues.

## 2022-06-30 NOTE — Discharge Instructions (Addendum)
Suspect you have conjunctivitis from a viral infection, started you on antibiotics please take as prescribed.  Like you to follow-up with ophthalmology for further evaluation.  In regards to your throat, recommend over-the-counter pain medications like ibuprofen Tylenol for fever and pain control, nasal decongestions like Flonase and Zyrtec, Mucinex for cough.  Follow-up PCP for further evaluation.  Come back to the emergency department if you develop chest pain, shortness of breath, severe abdominal pain, uncontrolled nausea, vomiting, diarrhea.

## 2022-06-30 NOTE — ED Provider Triage Note (Signed)
Emergency Medicine Provider Triage Evaluation Note  Brittany Mullins , a 42 y.o. female  was evaluated in triage.  Pt complains of sore throat and congestion over the past few days.  Now having some green colored mucous.  Woke up this AM and left eye was red with drainage.  Denies sick contacts.  Nausea without vomiting.  No fevers..  Review of Systems  Positive: Sore throat, congestion, left eye pain/redness Negative: fever  Physical Exam  BP (!) 150/104   Pulse 95   Temp 98.3 F (36.8 C) (Oral)   Resp 18   SpO2 97%  Gen:   Awake, no distress   Resp:  Normal effort  MSK:   Moves extremities without difficulty  Other:  Left conjunctiva does appear injected on exam, no visible drainage, no significant lid edema  Medical Decision Making  Medically screening exam initiated at 4:21 AM.  Appropriate orders placed.  Dalice Hedinger was informed that the remainder of the evaluation will be completed by another provider, this initial triage assessment does not replace that evaluation, and the importance of remaining in the ED until their evaluation is complete.  Sore throat, congestion, eye pain.  No sick contacts reported.  VSS.  Strep and RVP sent.   Larene Pickett, PA-C 06/30/22 [phone]

## 2022-06-30 NOTE — ED Provider Notes (Signed)
Brittany Mullins   CSN: [id number] Arrival date & time: 06/30/22  0408     History  Chief Complaint  Patient presents with   Conjunctivitis    Brittany Mullins is a 42 y.o. female.  HPI   Patient with medical history including hypertension, seizures, polycystic ovarian syndrome presenting with complaints of sore throat, and drainage from left eye.  Patient states started about 1 week ago, states that she has been having a slight productive cough, states phlegm is coming out, states that she is having some drainage from her left eye, states that this morning she felt like her eye was closed, she denies any change in vision, does not wear contacts, denies any pain with eye movements.  She denies chest pain, shortness of breath, denies any some pain nausea vomiting, general body aches.  She has no other complaints.    Home Medications Prior to Admission medications   Medication Sig Start Date End Date Taking? Authorizing Provider  erythromycin ophthalmic ointment Place a 1/2 inch ribbon of ointment into the lower eyelid 4 times a day for the next 5 days 06/30/22  Yes Marcello Fennel, PA-C  acetaminophen (TYLENOL) 325 MG tablet Take 2 tablets (650 mg total) by mouth every 6 (six) hours as needed for mild pain (or Fever >/= 101). 08/21/21   Sheikh, Omair Latif, DO  levETIRAcetam (KEPPRA) 500 MG tablet Take 1 tablet (500 mg total) by mouth 2 (two) times daily. 03/06/22   Eppie Gibson, MD  levonorgestrel-ethinyl estradiol (ALESSE) 0.1-20 MG-MCG tablet Take 1 tablet by mouth daily.    [provider]  methadone (DOLOPHINE) 10 MG/ML solution Take 120 mg by mouth daily.    [provider]  ondansetron (ZOFRAN) 4 MG tablet Take 1 tablet (4 mg total) by mouth every 6 (six) hours as needed for nausea. 08/21/21   Raiford Noble Latif, DO  pantoprazole (PROTONIX) 40 MG tablet Take 1 tablet (40 mg total) by mouth 2 (two)  times daily. Patient not taking: Reported on 03/06/2022 08/21/21   Raiford Noble Latif, DO  promethazine (PHENERGAN) 25 MG tablet Take 1 tablet (25 mg total) by mouth every 8 (eight) hours as needed for nausea or vomiting. Use only if Ondansetron is not effective Patient not taking: Reported on 03/06/2022 08/21/21   Raiford Noble Latif, DO      Allergies    Bee venom, Toradol [ketorolac tromethamine], and Benadryl [diphenhydramine]    Review of Systems   Review of Systems  Constitutional:  Negative for chills and fever.  HENT:  Positive for sore throat. Negative for trouble swallowing.   Eyes:  Positive for discharge. Negative for visual disturbance.  Respiratory:  Negative for shortness of breath.   Cardiovascular:  Negative for chest pain.  Gastrointestinal:  Negative for abdominal pain.  Neurological:  Negative for headaches.    Physical Exam Updated Vital Signs BP 131/85 (BP Location: Right Arm)   Pulse 67   Temp 97.7 F (36.5 C) (Oral)   Resp 16   SpO2 96%  Physical Exam Vitals and nursing Mullins reviewed.  Constitutional:      General: She is not in acute distress.    Appearance: She is not ill-appearing.  HENT:     Head: Normocephalic and atraumatic.     Nose: No congestion.     Mouth/Throat:     Mouth: Mucous membranes are moist.     Pharynx: Oropharynx is clear. No oropharyngeal exudate  or posterior oropharyngeal erythema.     Comments: No trismus no torticollis no oral edema present, tongue uvula both midline, controlling oral secretions, tonsils both equal symmetric bilaterally, no submandibular swelling, there is some cobblestoning present in the posterior pharynx. Eyes:     Extraocular Movements: Extraocular movements intact.     Conjunctiva/sclera: Conjunctivae normal.     Pupils: Pupils are equal, round, and reactive to light.     Comments: No noted proptosis, edema or erythema of the eyelids, EOMs intact, PERRLA, patient has noted crusting around the left eyelid,  there is no scleral injection, no dendritic lesion, no blood noted in the anterior chamber the eye, no noted corneal defects.  Cardiovascular:     Rate and Rhythm: Normal rate and regular rhythm.     Pulses: Normal pulses.     Heart sounds: No murmur heard.    No friction rub. No gallop.  Pulmonary:     Effort: No respiratory distress.     Breath sounds: No wheezing, rhonchi or rales.  Skin:    General: Skin is warm and dry.  Neurological:     Mental Status: She is alert.  Psychiatric:        Mood and Affect: Mood normal.     ED Results / Procedures / Treatments   Labs (all labs ordered are listed, but only abnormal results are displayed) Labs Reviewed  GROUP A STREP BY PCR  RESP PANEL BY RT-PCR (RSV, FLU A&B, COVID)  [id number]    EKG None  Radiology No results found.  Procedures Procedures    Medications Ordered in ED Medications  fluorescein ophthalmic strip 1 strip (has no administration in time range)    ED Course/ Medical Decision Making/ A&P                             Medical Decision Making Risk Prescription drug management.   This patient presents to the ED for concern of eye discharge, this involves an extensive number of treatment options, and is a complaint that carries with it a high risk of complications and morbidity.  The differential diagnosis includes corneal abrasion, conjunctivitis, hyphema, periorbital cellulitis    Additional history obtained:  Additional history obtained from N/A External records from outside source obtained and reviewed including recent ER notes   Co morbidities that complicate the patient evaluation  N/A  Social Determinants of Health:  N/A    Lab Tests:  I Ordered, and personally interpreted labs.  The pertinent results include: Respiratory panel negative, strep test negative     Imaging Studies ordered:  I ordered imaging studies including N/A I independently visualized and interpreted imaging  which showed N/A I agree with the radiologist interpretation   Cardiac Monitoring:  The patient was maintained on a cardiac monitor.  I personally viewed and interpreted the cardiac monitored which showed an underlying rhythm of: N/A   Medicines ordered and prescription drug management:  I ordered medication including fluorescein stain I have reviewed the patients home medicines and have made adjustments as needed  Critical Interventions:  N/A   Reevaluation:  Presents with left eye drainage, triage obtain basic lab work which I personally reviewed they are unremarkable, patient a benign physical exam, agreement discharge at this time.  Consultations Obtained:  N/A    Test Considered:  N/A    Rule out I have low suspicion for orbital cellulitis or periseptal cellulitis as patient had no pain  with EOMs, there is no induration, fluctuance noted on exam around eyes.  Low suspicion for keratitis as patient does not wear contacts, eyes were visualized there is no visible dendritic lesion noted, there is no severe amount of purulent discharge noted.  Low suspicion for hyphema as patient denies recent trauma to the area no blood noted in the anterior chamber.  Doubt corneal abrasion there is no fluorescein uptake present my exam presentation is atypical etiology. I have low suspicion for peritonsillar abscess, retropharyngeal abscess, or Ludwig angina as oropharynx was visualized tongue and uvula were both midline, there is no exudates, erythema or edema noted in the posterior pillars or on/ around tonsils.     Dispostion and problem list  After consideration of the diagnostic results and the patients response to treatment, I feel that the patent would benefit from discharge.  1.  Conjunctivitis-suspect this is viral she has viral-like symptoms, but I cannot exclude the possibility of bacterial infection, will start her on antibiotics, follow-up with ophthalmology for further  evaluation strict return precautions.            Final Clinical Impression(s) / ED Diagnoses Final diagnoses:  Acute conjunctivitis of left eye, unspecified acute conjunctivitis type    Rx / DC Orders ED Discharge Orders          Ordered    erythromycin ophthalmic ointment        06/30/22 0629              Marcello Fennel, PA-C [id number]    Delora Fuel, MD [id number] [phone]

## 2022-07-17 ENCOUNTER — Emergency Department (HOSPITAL_COMMUNITY)
Admission: EM | Admit: 2022-07-17 | Discharge: 2022-07-17 | Discharge status: Against Medical Advice | Payer: Medicare Other | Attending: Emergency Medicine | Admitting: Emergency Medicine

## 2022-07-17 ENCOUNTER — Other Ambulatory Visit: Payer: Self-pay

## 2022-07-17 DIAGNOSIS — Z5321 Procedure and treatment not carried out due to patient leaving prior to being seen by health care provider: Secondary | ICD-10-CM | POA: Diagnosis not present

## 2022-07-17 DIAGNOSIS — R112 Nausea with vomiting, unspecified: Secondary | ICD-10-CM | POA: Diagnosis present

## 2022-07-17 DIAGNOSIS — M545 Low back pain, unspecified: Secondary | ICD-10-CM | POA: Insufficient documentation

## 2022-07-17 LAB — CBC
HCT: 39.6 % (ref 36.0–46.0)
Hemoglobin: 13.5 g/dL (ref 12.0–15.0)
MCH: 30.5 pg (ref 26.0–34.0)
MCHC: 34.1 g/dL (ref 30.0–36.0)
MCV: 89.4 fL (ref 80.0–100.0)
Platelets: 220 10*3/uL (ref 150–400)
RBC: 4.43 MIL/uL (ref 3.87–5.11)
RDW: 12 % (ref 11.5–15.5)
WBC: 5.6 10*3/uL (ref 4.0–10.5)
nRBC: 0 % (ref 0.0–0.2)

## 2022-07-17 LAB — URINALYSIS, ROUTINE W REFLEX MICROSCOPIC
Bacteria, UA: NONE SEEN
Bilirubin Urine: NEGATIVE
Glucose, UA: NEGATIVE mg/dL
Hgb urine dipstick: NEGATIVE
Ketones, ur: NEGATIVE mg/dL
Leukocytes,Ua: NEGATIVE
Nitrite: NEGATIVE
Protein, ur: 30 mg/dL — AB
Specific Gravity, Urine: 1.027 (ref 1.005–1.030)
pH: 5 (ref 5.0–8.0)

## 2022-07-17 LAB — LIPASE, BLOOD: Lipase: 52 U/L — ABNORMAL HIGH (ref 11–51)

## 2022-07-17 LAB — COMPREHENSIVE METABOLIC PANEL
ALT: 14 U/L (ref 0–44)
AST: 26 U/L (ref 15–41)
Albumin: 3.7 g/dL (ref 3.5–5.0)
Alkaline Phosphatase: 55 U/L (ref 38–126)
Anion gap: 10 (ref 5–15)
BUN: 15 mg/dL (ref 6–20)
CO2: 24 mmol/L (ref 22–32)
Calcium: 8.9 mg/dL (ref 8.9–10.3)
Chloride: 105 mmol/L (ref 98–111)
Creatinine, Ser: 0.78 mg/dL (ref 0.44–1.00)
GFR, Estimated: >60 mL/min (ref >60)
Glucose, Bld: 112 mg/dL — ABNORMAL HIGH (ref 70–99)
Potassium: 3.3 mmol/L — ABNORMAL LOW (ref 3.5–5.1)
Sodium: 139 mmol/L (ref 135–145)
Total Bilirubin: 0.2 mg/dL — ABNORMAL LOW (ref 0.3–1.2)
Total Protein: 6.5 g/dL (ref 6.5–8.1)

## 2022-07-17 MED ORDER — ONDANSETRON 4 MG PO TBDP: 4.0000 mg | ORAL_TABLET | Freq: Once | ORAL | Status: DC | PRN

## 2022-07-17 NOTE — ED Provider Triage Note (Signed)
Emergency Medicine Provider Triage Evaluation Note  Brittany Mullins , a 42 y.o. female  was evaluated in triage.  Pt complains of frequent episodes of nausea and vomiting since yesterday.  Reports at least 10 episodes of nonbloody emesis.  States that she has Addison's disease and has had issues with vomiting when it has flared up before.  She is on prednisone for this at home.  She has some minimal right lower back pain but no abdominal pain, diarrhea, fever, chills, chest pain, or shortness of breath.  She denies any urinary symptoms.  Previous abdominal surgeries include hysterectomy, appendectomy, C-section, cholecystectomy, kidney and pancreatic stents.  Review of Systems  Positive: See HPI Negative: See HPI  Physical Exam  BP (!) 154/99 (BP Location: Right Arm)   Pulse 88   Temp 99 F (37.2 C)   Resp 16   SpO2 99%  Gen:   Awake, mild distress secondary to nausea Resp:  Normal effort lungs clear to auscultation MSK:   Moves extremities without difficulty  Other:  Abdomen soft and nontender without rebound, guarding, or peritoneal signs, no CVA tenderness, patient has mildly dry mucous membranes  Medical Decision Making  Medically screening exam initiated at 3:36 PM.  Appropriate orders placed.  Brittany Mullins was informed that the remainder of the evaluation will be completed by another provider, this initial triage assessment does not replace that evaluation, and the importance of remaining in the ED until their evaluation is complete.     Tonette Lederer, PA-C 07/17/22 1538

## 2022-07-17 NOTE — ED Triage Notes (Signed)
Pt with hx Addison's Disease here for eval of copious amounts of emesis since yesterday. Denies abdominal pain or diarrhea, but does endorse right flank pain.

## 2022-07-17 NOTE — ED Notes (Signed)
Call x2, no answer.

## 2022-07-17 NOTE — ED Notes (Signed)
Called for VS x 1, no answer

## 2022-08-17 ENCOUNTER — Other Ambulatory Visit: Payer: Self-pay

## 2022-08-17 ENCOUNTER — Encounter (HOSPITAL_COMMUNITY): Payer: Self-pay

## 2022-08-17 ENCOUNTER — Inpatient Hospital Stay (HOSPITAL_COMMUNITY): Payer: Medicare Other

## 2022-08-17 ENCOUNTER — Emergency Department (HOSPITAL_COMMUNITY): Payer: Medicare Other

## 2022-08-17 ENCOUNTER — Observation Stay (HOSPITAL_COMMUNITY)
Admission: EM | Admit: 2022-08-17 | Discharge: 2022-08-18 | Disposition: A | Discharge status: Home / Self Care | Payer: Medicare Other | Attending: Family Medicine | Admitting: Family Medicine

## 2022-08-17 DIAGNOSIS — Z79899 Other long term (current) drug therapy: Secondary | ICD-10-CM | POA: Diagnosis not present

## 2022-08-17 DIAGNOSIS — A059 Bacterial foodborne intoxication, unspecified: Secondary | ICD-10-CM | POA: Diagnosis not present

## 2022-08-17 DIAGNOSIS — R112 Nausea with vomiting, unspecified: Secondary | ICD-10-CM | POA: Diagnosis present

## 2022-08-17 DIAGNOSIS — F1120 Opioid dependence, uncomplicated: Secondary | ICD-10-CM | POA: Insufficient documentation

## 2022-08-17 DIAGNOSIS — G40909 Epilepsy, unspecified, not intractable, without status epilepticus: Secondary | ICD-10-CM | POA: Diagnosis not present

## 2022-08-17 DIAGNOSIS — Z3202 Encounter for pregnancy test, result negative: Secondary | ICD-10-CM | POA: Insufficient documentation

## 2022-08-17 DIAGNOSIS — Z1152 Encounter for screening for COVID-19: Secondary | ICD-10-CM | POA: Diagnosis not present

## 2022-08-17 DIAGNOSIS — R197 Diarrhea, unspecified: Secondary | ICD-10-CM | POA: Insufficient documentation

## 2022-08-17 DIAGNOSIS — I10 Essential (primary) hypertension: Secondary | ICD-10-CM | POA: Diagnosis not present

## 2022-08-17 DIAGNOSIS — R030 Elevated blood-pressure reading, without diagnosis of hypertension: Secondary | ICD-10-CM

## 2022-08-17 DIAGNOSIS — F1290 Cannabis use, unspecified, uncomplicated: Secondary | ICD-10-CM | POA: Diagnosis present

## 2022-08-17 LAB — COMPREHENSIVE METABOLIC PANEL
ALT: 28 U/L (ref 0–44)
AST: 38 U/L (ref 15–41)
Albumin: 4.2 g/dL (ref 3.5–5.0)
Alkaline Phosphatase: 58 U/L (ref 38–126)
Anion gap: 11 (ref 5–15)
BUN: 13 mg/dL (ref 6–20)
CO2: 22 mmol/L (ref 22–32)
Calcium: 8.8 mg/dL — ABNORMAL LOW (ref 8.9–10.3)
Chloride: 102 mmol/L (ref 98–111)
Creatinine, Ser: 0.74 mg/dL (ref 0.44–1.00)
GFR, Estimated: >60 mL/min (ref >60)
Glucose, Bld: 132 mg/dL — ABNORMAL HIGH (ref 70–99)
Potassium: 3.3 mmol/L — ABNORMAL LOW (ref 3.5–5.1)
Sodium: 135 mmol/L (ref 135–145)
Total Bilirubin: 0.9 mg/dL (ref 0.3–1.2)
Total Protein: 6.9 g/dL (ref 6.5–8.1)

## 2022-08-17 LAB — URINALYSIS, ROUTINE W REFLEX MICROSCOPIC
Bacteria, UA: NONE SEEN
Bilirubin Urine: NEGATIVE
Glucose, UA: NEGATIVE mg/dL
Hgb urine dipstick: NEGATIVE
Ketones, ur: 20 mg/dL — AB
Leukocytes,Ua: NEGATIVE
Nitrite: NEGATIVE
Protein, ur: 30 mg/dL — AB
Specific Gravity, Urine: 1.014 (ref 1.005–1.030)
pH: 8 (ref 5.0–8.0)

## 2022-08-17 LAB — CBC WITH DIFFERENTIAL/PLATELET
Abs Immature Granulocytes: 0.02 10*3/uL (ref 0.00–0.07)
Basophils Absolute: 0 10*3/uL (ref 0.0–0.1)
Basophils Relative: 0 %
Eosinophils Absolute: 0 10*3/uL (ref 0.0–0.5)
Eosinophils Relative: 0 %
HCT: 41.5 % (ref 36.0–46.0)
Hemoglobin: 14 g/dL (ref 12.0–15.0)
Immature Granulocytes: 0 %
Lymphocytes Relative: 6 %
Lymphs Abs: 0.6 10*3/uL — ABNORMAL LOW (ref 0.7–4.0)
MCH: 30.6 pg (ref 26.0–34.0)
MCHC: 33.7 g/dL (ref 30.0–36.0)
MCV: 90.8 fL (ref 80.0–100.0)
Monocytes Absolute: 0.3 10*3/uL (ref 0.1–1.0)
Monocytes Relative: 3 %
Neutro Abs: 8.7 10*3/uL — ABNORMAL HIGH (ref 1.7–7.7)
Neutrophils Relative %: 91 %
Platelets: 250 10*3/uL (ref 150–400)
RBC: 4.57 MIL/uL (ref 3.87–5.11)
RDW: 12.1 % (ref 11.5–15.5)
WBC: 9.6 10*3/uL (ref 4.0–10.5)
nRBC: 0 % (ref 0.0–0.2)

## 2022-08-17 LAB — HCG, QUANTITATIVE, PREGNANCY: hCG, Beta Chain, Quant, S: <1 m[IU]/mL (ref <5)

## 2022-08-17 LAB — I-STAT BETA HCG BLOOD, ED (MC, WL, AP ONLY): I-stat hCG, quantitative: <5 m[IU]/mL (ref <5)

## 2022-08-17 LAB — LIPASE, BLOOD: Lipase: 44 U/L (ref 11–51)

## 2022-08-17 LAB — SARS CORONAVIRUS 2 BY RT PCR: SARS Coronavirus 2 by RT PCR: NEGATIVE

## 2022-08-17 LAB — MAGNESIUM: Magnesium: 1.7 mg/dL (ref 1.7–2.4)

## 2022-08-17 LAB — POC URINE PREG, ED: Preg Test, Ur: NEGATIVE

## 2022-08-17 LAB — HIV ANTIBODY (ROUTINE TESTING W REFLEX): HIV Screen 4th Generation wRfx: NONREACTIVE

## 2022-08-17 MED ORDER — METOPROLOL TARTRATE 5 MG/5ML IV SOLN
5.0000 mg | Freq: Four times a day (QID) | INTRAVENOUS | Status: DC | PRN
  Administered 2022-08-17: 5 mg via INTRAVENOUS
  Filled 2022-08-17: qty 5

## 2022-08-17 MED ORDER — METHADONE HCL 10 MG/ML PO CONC: 120.0000 mg | Freq: Every day | ORAL | Status: DC

## 2022-08-17 MED ORDER — IOHEXOL 350 MG/ML SOLN
75.0000 mL | Freq: Once | INTRAVENOUS | Status: AC | PRN
  Administered 2022-08-17: 75 mL via INTRAVENOUS

## 2022-08-17 MED ORDER — POTASSIUM CHLORIDE 10 MEQ/100ML IV SOLN
10.0000 meq | INTRAVENOUS | Status: AC
  Administered 2022-08-17 – 2022-08-18 (×2): 10 meq via INTRAVENOUS
  Filled 2022-08-17 (×2): qty 100

## 2022-08-17 MED ORDER — ACETAMINOPHEN 650 MG RE SUPP: 650.0000 mg | Freq: Four times a day (QID) | RECTAL | Status: DC | PRN

## 2022-08-17 MED ORDER — POTASSIUM CHLORIDE 10 MEQ/100ML IV SOLN: 10.0000 meq | Freq: Once | INTRAVENOUS | Status: DC

## 2022-08-17 MED ORDER — HYDRALAZINE HCL 20 MG/ML IJ SOLN
10.0000 mg | Freq: Four times a day (QID) | INTRAMUSCULAR | Status: DC | PRN
  Administered 2022-08-17: 10 mg via INTRAVENOUS
  Filled 2022-08-17: qty 1

## 2022-08-17 MED ORDER — MORPHINE SULFATE (PF) 4 MG/ML IV SOLN
4.0000 mg | Freq: Once | INTRAVENOUS | Status: AC
  Administered 2022-08-17: 4 mg via INTRAVENOUS
  Filled 2022-08-17: qty 1

## 2022-08-17 MED ORDER — LACTATED RINGERS IV SOLN: INTRAVENOUS | Status: DC

## 2022-08-17 MED ORDER — POTASSIUM CHLORIDE 10 MEQ/100ML IV SOLN
10.0000 meq | INTRAVENOUS | Status: AC
  Administered 2022-08-17 (×2): 10 meq via INTRAVENOUS
  Filled 2022-08-17 (×2): qty 100

## 2022-08-17 MED ORDER — ONDANSETRON HCL 4 MG/2ML IJ SOLN
4.0000 mg | Freq: Once | INTRAMUSCULAR | Status: AC
  Administered 2022-08-17: 4 mg via INTRAVENOUS
  Filled 2022-08-17: qty 2

## 2022-08-17 MED ORDER — DROPERIDOL 2.5 MG/ML IJ SOLN
1.2500 mg | Freq: Once | INTRAMUSCULAR | Status: AC
  Administered 2022-08-17: 1.25 mg via INTRAVENOUS
  Filled 2022-08-17: qty 2

## 2022-08-17 MED ORDER — ACETAMINOPHEN 325 MG PO TABS
650.0000 mg | ORAL_TABLET | Freq: Four times a day (QID) | ORAL | Status: DC | PRN
  Administered 2022-08-18: 650 mg via ORAL
  Filled 2022-08-17: qty 2

## 2022-08-17 MED ORDER — SODIUM CHLORIDE 0.9 % IV BOLUS
1000.0000 mL | Freq: Once | INTRAVENOUS | Status: AC
  Administered 2022-08-17: 1000 mL via INTRAVENOUS

## 2022-08-17 MED ORDER — CLONIDINE HCL 0.2 MG PO TABS
0.2000 mg | ORAL_TABLET | Freq: Once | ORAL | Status: AC
  Administered 2022-08-17: 0.2 mg via ORAL
  Filled 2022-08-17: qty 1

## 2022-08-17 MED ORDER — SODIUM CHLORIDE 0.9 % IV SOLN
12.5000 mg | Freq: Four times a day (QID) | INTRAVENOUS | Status: DC | PRN
  Administered 2022-08-17: 12.5 mg via INTRAVENOUS
  Filled 2022-08-17: qty 12.5

## 2022-08-17 MED ORDER — ENOXAPARIN SODIUM 40 MG/0.4ML IJ SOSY
40.0000 mg | PREFILLED_SYRINGE | Freq: Every day | INTRAMUSCULAR | Status: DC
  Administered 2022-08-17: 40 mg via SUBCUTANEOUS
  Filled 2022-08-17: qty 0.4

## 2022-08-17 MED ORDER — MELATONIN 5 MG PO TABS: 5.0000 mg | ORAL_TABLET | Freq: Every evening | ORAL | Status: DC | PRN

## 2022-08-17 MED ORDER — SODIUM CHLORIDE 0.9 % IV SOLN: 25.0000 mg | Freq: Four times a day (QID) | INTRAVENOUS | Status: DC | PRN

## 2022-08-17 MED ORDER — ACETAMINOPHEN 325 MG PO TABS
650.0000 mg | ORAL_TABLET | Freq: Once | ORAL | Status: DC
  Filled 2022-08-17: qty 2

## 2022-08-17 NOTE — ED Provider Notes (Signed)
[place] EMERGENCY DEPARTMENT AT [facility] Provider Note   CSN: [id number] Arrival date & time: 08/17/22  1355     History  Chief Complaint  Patient presents with   Emesis    Hysterectomy   Lethargic   [redacted] wks pregnant    Brittany Mullins is a 42 y.o. female with a past medical history significant for polysubstance abuse, history of Addison's disease, and history of cyclic vomiting who presents to the ED due to nausea, vomiting, and diarrhea that started yesterday.  Patient admits to numerous episodes of nonbloody diarrhea and nonbloody, nonbilious emesis.  She is currently [redacted]w[redacted]d gestation with twins.  Follows with OB/GYN in [place].  Patient has had an ultrasound to confirm IUP per patient. G5P4.  Denies any abdominal pain, vaginal bleeding, or fluid from the vagina.  No fever or chills.  Patient notes she has been dealing with nausea and vomiting throughout her entire pregnancy thus far.  History obtained from patient and past medical records. No interpreter used during encounter.       Home Medications Prior to Admission medications   Medication Sig Start Date End Date Taking? Authorizing Provider  acetaminophen (TYLENOL) 325 MG tablet Take 2 tablets (650 mg total) by mouth every 6 (six) hours as needed for mild pain (or Fever >/= 101). 08/21/21   Marguerita Merles Latif, DO  erythromycin ophthalmic ointment Place a 1/2 inch ribbon of ointment into the lower eyelid 4 times a day for the next 5 days 06/30/22   Carroll Sage, PA-C  levETIRAcetam (KEPPRA) 500 MG tablet Take 1 tablet (500 mg total) by mouth 2 (two) times daily. 03/06/22   Alicia Amel, MD  levonorgestrel-ethinyl estradiol (ALESSE) 0.1-20 MG-MCG tablet Take 1 tablet by mouth daily.    [provider]  methadone (DOLOPHINE) 10 MG/ML solution Take 120 mg by mouth daily.    [provider]  ondansetron (ZOFRAN) 4 MG tablet Take 1 tablet (4 mg total) by mouth every 6 (six) hours as  needed for nausea. 08/21/21   Marguerita Merles Latif, DO  pantoprazole (PROTONIX) 40 MG tablet Take 1 tablet (40 mg total) by mouth 2 (two) times daily. Patient not taking: Reported on 03/06/2022 08/21/21   Marguerita Merles Latif, DO  promethazine (PHENERGAN) 25 MG tablet Take 1 tablet (25 mg total) by mouth every 8 (eight) hours as needed for nausea or vomiting. Use only if Ondansetron is not effective Patient not taking: Reported on 03/06/2022 08/21/21   Marguerita Merles Latif, DO      Allergies    Bee venom, Toradol [ketorolac tromethamine], and Benadryl [diphenhydramine]    Review of Systems   Review of Systems  Constitutional:  Negative for chills and fever.  Respiratory:  Negative for shortness of breath.   Cardiovascular:  Negative for chest pain.  Gastrointestinal:  Positive for diarrhea, nausea and vomiting. Negative for abdominal pain.  Genitourinary:  Negative for dysuria.    Physical Exam Updated Vital Signs BP (!) 183/94   Pulse (!) 43   Temp 99.1 F (37.3 C) (Oral)   Resp (!) 22   Ht 5\' 2"  (1.575 m)   Wt 61.2 kg   SpO2 99%   BMI 24.69 kg/m  Physical Exam Vitals and nursing note reviewed.  Constitutional:      General: She is not in acute distress.    Appearance: She is not ill-appearing.     Comments: Actively vomiting in emesis bag.  HENT:  Head: Normocephalic.  Eyes:     Pupils: Pupils are equal, round, and reactive to light.  Cardiovascular:     Rate and Rhythm: Normal rate and regular rhythm.     Pulses: Normal pulses.     Heart sounds: Normal heart sounds. No murmur heard.    No friction rub. No gallop.  Pulmonary:     Effort: Pulmonary effort is normal.     Breath sounds: Normal breath sounds.  Abdominal:     General: Abdomen is flat. There is no distension.     Palpations: Abdomen is soft.     Tenderness: There is abdominal tenderness. There is no guarding or rebound.     Comments: Diffuse tenderness  Musculoskeletal:        General: Normal range of  motion.     Cervical back: Neck supple.  Skin:    General: Skin is warm and dry.  Neurological:     General: No focal deficit present.     Mental Status: She is alert.  Psychiatric:        Mood and Affect: Mood normal.        Behavior: Behavior normal.     ED Results / Procedures / Treatments   Labs (all labs ordered are listed, but only abnormal results are displayed) Labs Reviewed  CBC WITH DIFFERENTIAL/PLATELET - Abnormal; Notable for the following components:      Result Value   Neutro Abs 8.7 (*)    Lymphs Abs 0.6 (*)    All other components within normal limits  COMPREHENSIVE METABOLIC PANEL - Abnormal; Notable for the following components:   Potassium 3.3 (*)    Glucose, Bld 132 (*)    Calcium 8.8 (*)    All other components within normal limits  URINALYSIS, ROUTINE W REFLEX MICROSCOPIC - Abnormal; Notable for the following components:   Color, Urine STRAW (*)    Ketones, ur 20 (*)    Protein, ur 30 (*)    All other components within normal limits  SARS CORONAVIRUS 2 BY RT PCR  HCG, QUANTITATIVE, PREGNANCY  LIPASE, BLOOD  MAGNESIUM  I-STAT BETA HCG BLOOD, ED ([facility], [facility], [facility] ONLY)  POC URINE PREG, ED    EKG EKG Interpretation  Date/Time:  Friday Aug 17 2022 14:06:22 EDT Ventricular Rate:  72 PR Interval:  146 QRS Duration: 99 QT Interval:  443 QTC Calculation: 485 R Axis:   96 Text Interpretation: Sinus rhythm Borderline right axis deviation Left ventricular hypertrophy ST elev, probable normal early repol pattern Confirmed by Virgina Norfolk ([id number]) on 08/17/2022 2:59:50 PM  Radiology CT ABDOMEN PELVIS W CONTRAST  Result Date: 08/17/2022 CLINICAL DATA:  Left lower quadrant abdominal pain EXAM: CT ABDOMEN AND PELVIS WITH CONTRAST TECHNIQUE: Multidetector CT imaging of the abdomen and pelvis was performed using the standard protocol following bolus administration of intravenous contrast. RADIATION DOSE REDUCTION: This exam was performed according to the  departmental dose-optimization program which includes automated exposure control, adjustment of the mA and/or kV according to patient size and/or use of iterative reconstruction technique. CONTRAST:  75mL OMNIPAQUE IOHEXOL 350 MG/ML SOLN COMPARISON:  None Available. FINDINGS: Lower chest: No acute abnormality. Hepatobiliary: No focal liver abnormality is seen. Status post cholecystectomy. No biliary dilatation. Pancreas: Unremarkable. No pancreatic ductal dilatation or surrounding inflammatory changes. Spleen: Normal in size without focal abnormality. Adrenals/Urinary Tract: Adrenal glands are unremarkable. Bilateral 2-3 mm renal calculi. No evidence of hydronephrosis or ureteral calculus. Bladder is unremarkable. Stomach/Bowel: Stomach is within normal limits. Appendix  not identified. No evidence of bowel wall thickening, distention, or inflammatory changes. Vascular/Lymphatic: No significant vascular findings are present. No enlarged abdominal or pelvic lymph nodes. Reproductive: Status post hysterectomy. No adnexal masses. Other: No abdominal wall hernia or abnormality. No abdominopelvic ascites. 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 No acute osseous abnormality. IMPRESSION: 1. Bilateral 2-3 mm nonobstructing renal calculi. No evidence of hydronephrosis or ureteral calculus. 2. No CT evidence of acute abdominal/pelvic process. 3. Status post cholecystectomy and hysterectomy. 4. 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 Electronically Signed   By: Larose Hires D.O.   On: 08/17/2022 20:21   US PELVIC COMPLETE W TRANSVAGINAL AND TORSION R/O  Result Date: 08/17/2022 CLINICAL DATA:  Pelvic pain bilaterally. History of hysterectomy. Negative pregnancy test. LMP 05/12/2022. EXAM: TRANSABDOMINAL AND TRANSVAGINAL ULTRASOUND OF PELVIS DOPPLER ULTRASOUND OF OVARIES TECHNIQUE: Both transabdominal and transvaginal ultrasound examinations of the pelvis were performed. Transabdominal  technique was performed for global imaging of the pelvis including uterus, ovaries, adnexal regions, and pelvic cul-de-sac. It was necessary to proceed with endovaginal exam following the transabdominal exam to visualize the ovaries. Color and duplex Doppler ultrasound was utilized to evaluate blood flow to the ovaries. COMPARISON:  None Available. FINDINGS: Uterus/Endometrium Not visualized.  Per chart review there is history of hysterectomy. Right ovary Measurements: 3.0 x 1.8 x 2.4 cm = volume: 6.6 mL. Normal appearance/no adnexal mass. Left ovary Measurements: 2.8 x 1.9 x 1.9 cm = volume: 5.2 mL. Normal appearance/no adnexal mass. Pulsed Doppler evaluation of both ovaries demonstrates normal low-resistance arterial and venous waveforms. Other findings No abnormal free fluid. IMPRESSION: 1. Bilateral ovaries appear normal. No evidence of ovarian torsion. Electronically Signed   By: Minerva Fester M.D.   On: 08/17/2022 17:56    Procedures Procedures    Medications Ordered in ED Medications  promethazine (PHENERGAN) 12.5 mg in sodium chloride 0.9 % 50 mL IVPB (0 mg Intravenous Stopped 08/17/22 1900)  acetaminophen (TYLENOL) tablet 650 mg (650 mg Oral Not Given 08/17/22 1840)  potassium chloride 10 mEq in 100 mL IVPB (10 mEq Intravenous New Bag/Given 08/17/22 2021)  sodium chloride 0.9 % bolus 1,000 mL (0 mLs Intravenous Stopped 08/17/22 1615)  ondansetron (ZOFRAN) injection 4 mg (4 mg Intravenous Given 08/17/22 1441)  morphine (PF) 4 MG/ML injection 4 mg (4 mg Intravenous Given 08/17/22 1838)  droperidol (INAPSINE) 2.5 MG/ML injection 1.25 mg (1.25 mg Intravenous Given 08/17/22 1956)  iohexol (OMNIPAQUE) 350 MG/ML injection 75 mL (75 mLs Intravenous Contrast Given 08/17/22 2015)  cloNIDine (CATAPRES) tablet 0.2 mg (0.2 mg Oral Given 08/17/22 2057)    ED Course/ Medical Decision Making/ A&P Clinical Course as of 08/17/22 2104  Fri Aug 17, 2022  1552 Stable Patient reported positive pregnancy test in  the outpatient setting and positive ultrasound but negative testing here.  Proceeding to ultrasound.  Is endorsing ongoing nausea vomiting no acute distress. [CC]  1949 Discussed with Johnathan from pharmacy who recommends giving 2 runs of IV potassium with Droperidol due to hypokalemia. Patient unable to tolerate po. 2 runs IV potassium ordered. Magnesium lab ordered. [CA]    Clinical Course User Index [CA] Mannie Stabile, PA-C [CC] Glyn Ade, MD                             Medical Decision Making Amount and/or Complexity of Data Reviewed Independent Historian: friend External Data Reviewed: notes.    Details: UC yesterday had  a drug screening Labs: ordered. Decision-making details documented in ED Course. Radiology: ordered and independent interpretation performed. Decision-making details documented in ED Course. ECG/medicine tests: ordered and independent interpretation performed. Decision-making details documented in ED Course.  Risk OTC drugs. Prescription drug management. Decision regarding hospitalization.   This patient presents to the ED for concern of N/V/D, this involves an extensive number of treatment options, and is a complaint that carries with it a high risk of complications and morbidity.  The differential diagnosis includes Gastroenteritis, methadone withdrawal, cannabinoid hyperemesis, etc  42 year old G5P4 currently [redacted]w[redacted]d gestation with twins presents to the ED due to nausea, vomiting, and diarrhea that started yesterday.  Patient admits to numerous episodes of nonbloody, nonbilious emesis and nonbloody diarrhea.  She has been dealing with nausea and vomiting throughout her entire pregnancy thus far.  Follows OB/GYN in [place].  Had an ultrasound to confirm IUP per patient on 5/13.  History of polysubstance abuse and cyclic vomiting syndrome.  Currently on methadone. Denies abdominal pain, vaginal bleeding, or fluid from the vagina.  Upon arrival patient  hypertensive at 197/97.  Upon recheck during initial evaluation BP 183/110.  Prior to initial evaluation patient given IV fluids and Zofran and still actively vomiting at bedside.  Routine labs ordered. If Istat hcg is positive, will send to [facility] for further treatment.   3:35 PM i-STAT hCG negative.  Discussed with patient at bedside.  Patient states she had a ultrasound in [place] on Monday of this week.  Patient denies any vaginal bleeding. Will obtain urine pregnancy test. I would suspect Hcg to still be elevated even if she had a miscarriage between Monday and today.  Unable to pull up any medical records. Hcg quantitative test pending.   Urine pregnancy test negative.  Will obtain ultrasound given patient is adamant that she had a ultrasound on Monday with twin gestation (photo above).  If ultrasound is negative we will give droperidol for possible cannabinoid hyperemesis syndrome.  4:11 PM reassessed patient at bedside.  Patient able to find ultrasound from Monday. 2 fetuses present on [place] picture with name and date on top of picture. Patient given Promethazine for nausea and vomtiing. Awaiting [place] to confirm pregnancy. False negative pregnancy test??  6:31 PM reassessed patient at bedside.  Ultrasound shows history of hysterectomy.  Patient denied any history of hysterectomy.  No evidence of pregnancy on ultrasound.  Abdomen soft, nondistended with diffuse tenderness.  CT abdomen ordered to rule out evidence of diverticulitis, appendicitis, or other etiologies of abdominal pain. Morphine given.   7:47 PM informed by RN that patient still vomiting after Zofran and Phenergan.  Droperidol given.  EKG with QTc below 500.  Awaiting CT abdomen.  If patient still continues to vomit after droperidol will need admission for intractable nausea and vomiting.  Discussed with pharmacy. See note above.  8:46 PM reassessed patient at bedside.  Patient admits to nausea and continued vomiting after droperidol.   Will discuss with hospitalist for admission for intractable nausea and vomiting. Possible gastroenteritis vs. Cannabinoid hyperemesis syndrome. Possible withdrawal? Hasn't been able to tolerate methadone in 2 days.  8:54 PM informed by RN that patient's BP elevated.  Reported to bedside.  Rechecked BP which was 200/100.  Patient resting comfortably in bed.  Denies history of hypertension however, notes this typically occurs when she has not taken her methadone.  Patient notes she has missed 2 doses of methadone due to intractable nausea and vomiting.  Clonidine given.  9:00  PM Discussed with Dr. Gasper Sells with [facility] who agrees to admit patient.   Discussed with Dr. Doran Durand who agrees with assessment and plan.       Final Clinical Impression(s) / ED Diagnoses Final diagnoses:  Nausea vomiting and diarrhea  Elevated blood pressure reading    Rx / DC Orders ED Discharge Orders     None         Brittany Mullins 08/17/22 2104    Glyn Ade, MD 08/19/22 1351

## 2022-08-17 NOTE — ED Notes (Signed)
Patient transported to CT scan . 

## 2022-08-17 NOTE — ED Notes (Signed)
Got patient undressed into a gown on the monitor did EKG shown to Dr Lockie Mola patient is resting with family at bedside and call bell in reach

## 2022-08-17 NOTE — ED Notes (Addendum)
PA notified on patient's persistent hypertension .

## 2022-08-17 NOTE — ED Notes (Signed)
Patient transported to [facility] 

## 2022-08-17 NOTE — ED Triage Notes (Signed)
Pt BIB [facility] from home d/t continuous n/v since this morning. Pt reports she was at the [facility] when she began vomiting & it became more continuously, once she returned home she became very lethargic. She is [redacted] weeks pregnant with twins confirmed by her OBGYN. EMS reports 210/117, CBG 159, 4 zofran was given. Does have Hx of seizures, pre-eclampsia & brain tumor. A/Ox4.

## 2022-08-17 NOTE — ED Notes (Signed)
PA notified on patient's hypertension and persistent emesis .

## 2022-08-17 NOTE — ED Notes (Signed)
ED TO INPATIENT HANDOFF REPORT  ED Nurse Name and Phone #:  [phone] Les Pou RN   S Name/Age/Gender Brittany Mullins 42 y.o. female Room/Bed: RESUSC/RESUSC  Code Status   Code Status: Full Code  Home/SNF/Other Home Patient oriented to: self, place, time, and situation Is this baseline? Yes   Triage Complete: Triage complete  Chief Complaint Intractable vomiting with nausea [R11.2]  Triage Note Pt BIB [facility] from home d/t continuous n/v since this morning. Pt reports she was at the [facility] when she began vomiting & it became more continuously, once she returned home she became very lethargic. She is [redacted] weeks pregnant with twins confirmed by her OBGYN. EMS reports 210/117, CBG 159, 4 zofran was given. Does have Hx of seizures, pre-eclampsia & brain tumor. A/Ox4.     Allergies Allergies  Allergen Reactions   Bee Venom Anaphylaxis   Toradol [Ketorolac Tromethamine] Shortness Of Breath   Benadryl [Diphenhydramine] Other (See Comments)    States she cannot breathe    Level of Care/Admitting Diagnosis ED Disposition     ED Disposition  Admit   Condition  --   Comment  Hospital [facility] [[id number]]  Level of Care: Telemetry Medical [[id number]]  May admit patient to [facility] or [facility] if equivalent level of care is available:: Yes  Covid Evaluation: Asymptomatic - no recent exposure (last 10 days) testing not required  Diagnosis: Intractable vomiting with nausea [1478295]  Admitting Physician: Buena Irish [[id number]]  Attending Physician: Buena Irish [phone]  Certification:: I certify this patient will need inpatient services for at least 2 midnights  Estimated Length of Stay: 2          B Medical/Surgery History Past Medical History:  Diagnosis Date   Addison's disease (HCC)    Brain tumor (benign) (HCC)    Hypertension    Marijuana use    Per pt: "medical marijuana patient"   Pancreatitis    PCOS (polycystic ovarian syndrome)     Pituitary tumor    Seizures (HCC)    Past Surgical History:  Procedure Laterality Date   ABDOMINAL HYSTERECTOMY     APPENDECTOMY     BIOPSY  08/16/2021   Procedure: BIOPSY;  Surgeon: Shellia Cleverly, DO;  Location: [facility];  Service: Gastroenterology;;   CESAREAN SECTION     CHOLECYSTECTOMY     ELBOW SURGERY     ESOPHAGOGASTRODUODENOSCOPY (EGD) WITH PROPOFOL N/A 08/16/2021   Procedure: ESOPHAGOGASTRODUODENOSCOPY (EGD) WITH PROPOFOL;  Surgeon: Shellia Cleverly, DO;  Location: [facility];  Service: Gastroenterology;  Laterality: N/A;   kidney stent     pancreatic stent       A IV Location/Drains/Wounds Patient Lines/Drains/Airways Status     Active Line/Drains/Airways     Name Placement date Placement time Site Days   Peripheral IV 08/17/22 20 G Left Antecubital 08/17/22  1411  Antecubital  less than 1            Intake/Output Last 24 hours  Intake/Output Summary (Last 24 hours) at 08/17/2022 2207 Last data filed at 08/17/2022 2112 Gross per 24 hour  Intake 100 ml  Output --  Net 100 ml    Labs/Imaging Results for orders placed or performed during the hospital encounter of 08/17/22 (from the past 48 hour(s))  CBC with Differential     Status: Abnormal   Collection Time: 08/17/22  2:21 PM  Result Value Ref Range   WBC 9.6 4.0 - 10.5 K/uL   RBC 4.57  3.87 - 5.11 MIL/uL   Hemoglobin 14.0 12.0 - 15.0 g/dL   HCT 16.1 09.6 - 04.5 %   MCV 90.8 80.0 - 100.0 fL   MCH 30.6 26.0 - 34.0 pg   MCHC 33.7 30.0 - 36.0 g/dL   RDW 40.9 81.1 - 91.4 %   Platelets 250 150 - 400 K/uL   nRBC 0.0 0.0 - 0.2 %   Neutrophils Relative % 91 %   Neutro Abs 8.7 (H) 1.7 - 7.7 K/uL   Lymphocytes Relative 6 %   Lymphs Abs 0.6 (L) 0.7 - 4.0 K/uL   Monocytes Relative 3 %   Monocytes Absolute 0.3 0.1 - 1.0 K/uL   Eosinophils Relative 0 %   Eosinophils Absolute 0.0 0.0 - 0.5 K/uL   Basophils Relative 0 %   Basophils Absolute 0.0 0.0 - 0.1 K/uL   Immature Granulocytes 0 %   Abs  Immature Granulocytes 0.02 0.00 - 0.07 K/uL    Comment: Performed at [facility], [address], [place], [place] [address]  Comprehensive metabolic panel     Status: Abnormal   Collection Time: 08/17/22  2:21 PM  Result Value Ref Range   Sodium 135 135 - 145 mmol/L   Potassium 3.3 (L) 3.5 - 5.1 mmol/L   Chloride 102 98 - 111 mmol/L   CO2 22 22 - 32 mmol/L   Glucose, Bld 132 (H) 70 - 99 mg/dL    Comment: Glucose reference range applies only to samples taken after fasting for at least 8 hours.   BUN 13 6 - 20 mg/dL   Creatinine, Ser 6.21 0.44 - 1.00 mg/dL   Calcium 8.8 (L) 8.9 - 10.3 mg/dL   Total Protein 6.9 6.5 - 8.1 g/dL   Albumin 4.2 3.5 - 5.0 g/dL   AST 38 15 - 41 U/L   ALT 28 0 - 44 U/L   Alkaline Phosphatase 58 38 - 126 U/L   Total Bilirubin 0.9 0.3 - 1.2 mg/dL   GFR, Estimated >30 >86 mL/min    Comment: (NOTE) Calculated using the CKD-EPI Creatinine Equation (2021)    Anion gap 11 5 - 15    Comment: Performed at [facility], [address] [place], [place] [address]  Urinalysis, Routine w reflex microscopic -Urine, Clean Catch     Status: Abnormal   Collection Time: 08/17/22  2:21 PM  Result Value Ref Range   Color, Urine STRAW (A) YELLOW   APPearance CLEAR CLEAR   Specific Gravity, Urine 1.014 1.005 - 1.030   pH 8.0 5.0 - 8.0   Glucose, UA NEGATIVE NEGATIVE mg/dL   Hgb urine dipstick NEGATIVE NEGATIVE   Bilirubin Urine NEGATIVE NEGATIVE   Ketones, ur 20 (A) NEGATIVE mg/dL   Protein, ur 30 (A) NEGATIVE mg/dL   Nitrite NEGATIVE NEGATIVE   Leukocytes,Ua NEGATIVE NEGATIVE   RBC / HPF 0-5 0 - 5 RBC/hpf   WBC, UA 0-5 0 - 5 WBC/hpf   Bacteria, UA NONE SEEN NONE SEEN   Squamous Epithelial / HPF 0-5 0 - 5 /HPF   Mucus PRESENT     Comment: Performed at [facility], [address] [place], [place] [address]  hCG, quantitative, pregnancy     Status: None   Collection Time: 08/17/22  2:21 PM  Result Value Ref Range   hCG, Beta Chain, Quant, S <1 <5  mIU/mL    Comment:          GEST. AGE  CONC.  (mIU/mL)   <=1 WEEK        5 - 50     2 WEEKS       50 - 500     3 WEEKS       100 - 10,000     4 WEEKS     1,000 - 30,000     5 WEEKS     3,500 - 115,000   6-8 WEEKS     12,000 - 270,000    12 WEEKS     15,000 - 220,000        FEMALE AND NON-PREGNANT FEMALE:     LESS THAN 5 mIU/mL Performed at [facility], [address], [place], [place] [address]   Magnesium     Status: None   Collection Time: 08/17/22  2:21 PM  Result Value Ref Range   Magnesium 1.7 1.7 - 2.4 mg/dL    Comment: Performed at [facility], [address] [place], [place] [address]  I-Stat beta hCG blood, ED ([facility], [facility], [facility] only)     Status: None   Collection Time: 08/17/22  3:08 PM  Result Value Ref Range   I-stat hCG, quantitative <5.0 <5 mIU/mL   Comment 3            Comment:   GEST. AGE      CONC.  (mIU/mL)   <=1 WEEK        5 - 50     2 WEEKS       50 - 500     3 WEEKS       100 - 10,000     4 WEEKS     1,000 - 30,000        FEMALE AND NON-PREGNANT FEMALE:     LESS THAN 5 mIU/mL   Lipase, blood     Status: None   Collection Time: 08/17/22  3:27 PM  Result Value Ref Range   Lipase 44 11 - 51 U/L    Comment: Performed at [facility], [address], [place], [place] [address]  POC Urine Pregnancy, ED (not at [facility] or [facility])     Status: None   Collection Time: 08/17/22  3:52 PM  Result Value Ref Range   Preg Test, Ur NEGATIVE NEGATIVE    Comment:        THE SENSITIVITY OF THIS METHODOLOGY IS >24 mIU/mL   SARS Coronavirus 2 by RT PCR (hospital order, performed in [facility] hospital lab) *cepheid single result test* Anterior Nasal Swab     Status: None   Collection Time: 08/17/22  4:29 PM   Specimen: Anterior Nasal Swab  Result Value Ref Range   SARS Coronavirus 2 by RT PCR NEGATIVE NEGATIVE    Comment: Performed at [facility], [address], [place], [place] [address]   CT ABDOMEN PELVIS W CONTRAST  Result Date:  08/17/2022 CLINICAL DATA:  Left lower quadrant abdominal pain EXAM: CT ABDOMEN AND PELVIS WITH CONTRAST TECHNIQUE: Multidetector CT imaging of the abdomen and pelvis was performed using the standard protocol following bolus administration of intravenous contrast. RADIATION DOSE REDUCTION: This exam was performed according to the departmental dose-optimization program which includes automated exposure control, adjustment of the mA and/or kV according to patient size and/or use of iterative reconstruction technique. CONTRAST:  75mL OMNIPAQUE IOHEXOL 350 MG/ML SOLN COMPARISON:  None Available. FINDINGS: Lower chest: No acute abnormality. Hepatobiliary: No focal liver abnormality is  seen. Status post cholecystectomy. No biliary dilatation. Pancreas: Unremarkable. No pancreatic ductal dilatation or surrounding inflammatory changes. Spleen: Normal in size without focal abnormality. Adrenals/Urinary Tract: Adrenal glands are unremarkable. Bilateral 2-3 mm renal calculi. No evidence of hydronephrosis or ureteral calculus. Bladder is unremarkable. Stomach/Bowel: Stomach is within normal limits. Appendix not identified. No evidence of bowel wall thickening, distention, or inflammatory changes. Vascular/Lymphatic: No significant vascular findings are present. No enlarged abdominal or pelvic lymph nodes. Reproductive: Status post hysterectomy. No adnexal masses. Other: No abdominal wall hernia or abnormality. No abdominopelvic ascites. 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 No acute osseous abnormality. IMPRESSION: 1. Bilateral 2-3 mm nonobstructing renal calculi. No evidence of hydronephrosis or ureteral calculus. 2. No CT evidence of acute abdominal/pelvic process. 3. Status post cholecystectomy and hysterectomy. 4. 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 Electronically Signed   By: Larose Hires D.O.   On: 08/17/2022 20:21   US PELVIC COMPLETE W TRANSVAGINAL AND TORSION R/O  Result  Date: 08/17/2022 CLINICAL DATA:  Pelvic pain bilaterally. History of hysterectomy. Negative pregnancy test. LMP 05/12/2022. EXAM: TRANSABDOMINAL AND TRANSVAGINAL ULTRASOUND OF PELVIS DOPPLER ULTRASOUND OF OVARIES TECHNIQUE: Both transabdominal and transvaginal ultrasound examinations of the pelvis were performed. Transabdominal technique was performed for global imaging of the pelvis including uterus, ovaries, adnexal regions, and pelvic cul-de-sac. It was necessary to proceed with endovaginal exam following the transabdominal exam to visualize the ovaries. Color and duplex Doppler ultrasound was utilized to evaluate blood flow to the ovaries. COMPARISON:  None Available. FINDINGS: Uterus/Endometrium Not visualized.  Per chart review there is history of hysterectomy. Right ovary Measurements: 3.0 x 1.8 x 2.4 cm = volume: 6.6 mL. Normal appearance/no adnexal mass. Left ovary Measurements: 2.8 x 1.9 x 1.9 cm = volume: 5.2 mL. Normal appearance/no adnexal mass. Pulsed Doppler evaluation of both ovaries demonstrates normal low-resistance arterial and venous waveforms. Other findings No abnormal free fluid. IMPRESSION: 1. Bilateral ovaries appear normal. No evidence of ovarian torsion. Electronically Signed   By: Minerva Fester M.D.   On: 08/17/2022 17:56    Pending Labs Unresulted Labs (From admission, onward)     Start     Ordered   08/24/22 0500  Creatinine, serum  (enoxaparin (LOVENOX)    CrCl >/= 30 ml/min)  Weekly,   R     Comments: while on enoxaparin therapy    08/17/22 2201   08/18/22 0500  Basic metabolic panel  Tomorrow morning,   R        08/17/22 2201   08/18/22 0500  CBC  Tomorrow morning,   R        08/17/22 2201   08/18/22 0500  Magnesium  Tomorrow morning,   R        08/17/22 2201   08/17/22 2155  HIV Antibody (routine testing w rflx)  (HIV Antibody (Routine testing w reflex) panel)  Once,   R        08/17/22 2201   08/17/22 2118  Rapid urine drug screen (hospital performed)  Add-on,    AD        08/17/22 2117            Vitals/Pain Today's Vitals   08/17/22 1934 08/17/22 1941 08/17/22 2000 08/17/22 2130  BP:  (!) 188/109 (!) 183/94 (!) 201/114  Pulse:  93 (!) 43 (!) 110  Resp:  18 (!) 22 (!) 21  Temp:      TempSrc:      SpO2:  100%  99% 99%  Weight:      Height:      PainSc: 0-No pain Asleep      Isolation Precautions Airborne and Contact precautions  Medications Medications  acetaminophen (TYLENOL) tablet 650 mg (650 mg Oral Not Given 08/17/22 1840)  potassium chloride 10 mEq in 100 mL IVPB (10 mEq Intravenous New Bag/Given 08/17/22 2113)  methadone (DOLOPHINE) 10 MG/ML solution 120 mg (has no administration in time range)  enoxaparin (LOVENOX) injection 40 mg (has no administration in time range)  acetaminophen (TYLENOL) tablet 650 mg (has no administration in time range)    Or  acetaminophen (TYLENOL) suppository 650 mg (has no administration in time range)  melatonin tablet 5 mg (has no administration in time range)  promethazine (PHENERGAN) 25 mg in sodium chloride 0.9 % 50 mL IVPB (has no administration in time range)  sodium chloride 0.9 % bolus 1,000 mL (0 mLs Intravenous Stopped 08/17/22 1615)  ondansetron (ZOFRAN) injection 4 mg (4 mg Intravenous Given 08/17/22 1441)  morphine (PF) 4 MG/ML injection 4 mg (4 mg Intravenous Given 08/17/22 1838)  droperidol (INAPSINE) 2.5 MG/ML injection 1.25 mg (1.25 mg Intravenous Given 08/17/22 1956)  iohexol (OMNIPAQUE) 350 MG/ML injection 75 mL (75 mLs Intravenous Contrast Given 08/17/22 2015)  cloNIDine (CATAPRES) tablet 0.2 mg (0.2 mg Oral Given 08/17/22 2057)    Mobility walks     Focused Assessments     R Recommendations: See Admitting Provider Note  Report given to:   Additional Notes:

## 2022-08-17 NOTE — ED Notes (Signed)
Admitting MD paged by secretary for RN to update on patient's persistent hypertension/tachycardia.

## 2022-08-17 NOTE — H&P (Addendum)
History and Physical    Patient: Brittany Mullins WUJ:[id number] DOB: 1981-03-22 DOA: 08/17/2022 DOS: the patient was seen and examined on 08/17/2022 PCP: Luciano Cutter, PA-C  Patient coming from: Home  Chief Complaint:  Chief Complaint  Patient presents with   Emesis    Hysterectomy   Lethargic   [redacted] wks pregnant   HPI: Brittany Mullins is a 42 y.o. female with medical history significant for seizures, history of Addison's disease on methadone replacement therapy who presents with intractable nausea and vomiting.  She has also had diarrhea but no fevers.  My history comes from the emergency department provider as the patient was sedated at the time of my evaluation and unable to provide any history.  She apparently told the emergency department that she was pregnant with twins on arrival.  This turns out not to be the case her pregnancy test is negative and the CT reveals that she has had a hysterectomy. The patient was treated aggressively with IV Phenergan, Zofran, and droperidol but continued to be very nauseated.  We were called to admit the patient at that time.  She does not have fever or elevated white count no other sign of gastroenteritis.  Her CT was normal without any sign of colitis or to diagnosis to explain her nausea and vomiting.  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  Her talk screen is currently pending.  Patient will be admitted for symptomatic care and further evaluation.   Review of Systems: unable to review all systems due to the inability of the patient to answer questions. Past Medical History:  Diagnosis Date   Addison's disease (HCC)    Brain tumor (benign) (HCC)    Hypertension    Marijuana use    Per pt: "medical marijuana patient"   Pancreatitis    PCOS (polycystic ovarian syndrome)    Pituitary tumor    Seizures (HCC)    Past Surgical History:  Procedure Laterality Date   ABDOMINAL HYSTERECTOMY     APPENDECTOMY      BIOPSY  08/16/2021   Procedure: BIOPSY;  Surgeon: Shellia Cleverly, DO;  Location: [facility];  Service: Gastroenterology;;   CESAREAN SECTION     CHOLECYSTECTOMY     ELBOW SURGERY     ESOPHAGOGASTRODUODENOSCOPY (EGD) WITH PROPOFOL N/A 08/16/2021   Procedure: ESOPHAGOGASTRODUODENOSCOPY (EGD) WITH PROPOFOL;  Surgeon: Shellia Cleverly, DO;  Location: [facility];  Service: Gastroenterology;  Laterality: N/A;   kidney stent     pancreatic stent     Social History:  reports that she has never smoked. She has never used smokeless tobacco. She reports current drug use. Drug: Marijuana. She reports that she does not drink alcohol.  Allergies  Allergen Reactions   Bee Venom Anaphylaxis   Toradol [Ketorolac Tromethamine] Shortness Of Breath   Benadryl [Diphenhydramine] Other (See Comments)    States she cannot breathe    Family History  Problem Relation Age of Onset   Cancer Mother    Hypertension Mother    Diabetes Maternal Grandmother    Diabetes Maternal Grandfather     Prior to Admission medications   Medication Sig Start Date End Date Taking? Authorizing Provider  acetaminophen (TYLENOL) 325 MG tablet Take 2 tablets (650 mg total) by mouth every 6 (six) hours as needed for mild pain (or Fever >/= 101). 08/21/21   Sheikh, Kateri Mc Latif, DO  erythromycin ophthalmic ointment Place a 1/2 inch ribbon of ointment into the lower eyelid 4 times  a day for the next 5 days 06/30/22   Carroll Sage, PA-C  levETIRAcetam (KEPPRA) 500 MG tablet Take 1 tablet (500 mg total) by mouth 2 (two) times daily. 03/06/22   Alicia Amel, MD  levonorgestrel-ethinyl estradiol (ALESSE) 0.1-20 MG-MCG tablet Take 1 tablet by mouth daily.    [provider]  methadone (DOLOPHINE) 10 MG/ML solution Take 120 mg by mouth daily.    [provider]  ondansetron (ZOFRAN) 4 MG tablet Take 1 tablet (4 mg total) by mouth every 6 (six) hours as needed for nausea. 08/21/21   Marguerita Merles Latif,  DO  pantoprazole (PROTONIX) 40 MG tablet Take 1 tablet (40 mg total) by mouth 2 (two) times daily. Patient not taking: Reported on 03/06/2022 08/21/21   Marguerita Merles Latif, DO  promethazine (PHENERGAN) 25 MG tablet Take 1 tablet (25 mg total) by mouth every 8 (eight) hours as needed for nausea or vomiting. Use only if Ondansetron is not effective Patient not taking: Reported on 03/06/2022 08/21/21   Merlene Laughter, DO    Physical Exam: Vitals:   08/17/22 1841 08/17/22 1941 08/17/22 2000 08/17/22 2130  BP:  (!) 188/109 (!) 183/94 (!) 201/114  Pulse:  93 (!) 43 (!) 110  Resp:  18 (!) 22 (!) 21  Temp: 99.1 F (37.3 C)     TempSrc: Oral     SpO2:  100% 99% 99%  Weight:      Height:       Physical Exam:  General: No acute distress, well developed, well nourished, lethargic HEENT: Normocephalic, atraumatic, PERRL Cardiovascular: Normal rate and rhythm. Distal pulses intact. Pulmonary: Normal pulmonary effort, normal breath sounds Gastrointestinal: Nondistended abdomen, soft, mild diffuse tendereness, normoactive bowel sounds, no organomegaly Musculoskeletal:Normal ROM, no lower ext edema Lymphadenopathy: No cervical LAD. Skin: Skin is warm and dry. Neuro: sedated.  She will awaken but falls back to sleep before finishing a sentence. PSYCH: sedated   Data Reviewed:  Results for orders placed or performed during the hospital encounter of 08/17/22 (from the past 24 hour(s))  CBC with Differential     Status: Abnormal   Collection Time: 08/17/22  2:21 PM  Result Value Ref Range   WBC 9.6 4.0 - 10.5 K/uL   RBC 4.57 3.87 - 5.11 MIL/uL   Hemoglobin 14.0 12.0 - 15.0 g/dL   HCT 16.1 09.6 - 04.5 %   MCV 90.8 80.0 - 100.0 fL   MCH 30.6 26.0 - 34.0 pg   MCHC 33.7 30.0 - 36.0 g/dL   RDW 40.9 81.1 - 91.4 %   Platelets 250 150 - 400 K/uL   nRBC 0.0 0.0 - 0.2 %   Neutrophils Relative % 91 %   Neutro Abs 8.7 (H) 1.7 - 7.7 K/uL   Lymphocytes Relative 6 %   Lymphs Abs 0.6 (L) 0.7 - 4.0  K/uL   Monocytes Relative 3 %   Monocytes Absolute 0.3 0.1 - 1.0 K/uL   Eosinophils Relative 0 %   Eosinophils Absolute 0.0 0.0 - 0.5 K/uL   Basophils Relative 0 %   Basophils Absolute 0.0 0.0 - 0.1 K/uL   Immature Granulocytes 0 %   Abs Immature Granulocytes 0.02 0.00 - 0.07 K/uL  Comprehensive metabolic panel     Status: Abnormal   Collection Time: 08/17/22  2:21 PM  Result Value Ref Range   Sodium 135 135 - 145 mmol/L   Potassium 3.3 (L) 3.5 - 5.1 mmol/L   Chloride 102 98 - 111  mmol/L   CO2 22 22 - 32 mmol/L   Glucose, Bld 132 (H) 70 - 99 mg/dL   BUN 13 6 - 20 mg/dL   Creatinine, Ser 1.91 0.44 - 1.00 mg/dL   Calcium 8.8 (L) 8.9 - 10.3 mg/dL   Total Protein 6.9 6.5 - 8.1 g/dL   Albumin 4.2 3.5 - 5.0 g/dL   AST 38 15 - 41 U/L   ALT 28 0 - 44 U/L   Alkaline Phosphatase 58 38 - 126 U/L   Total Bilirubin 0.9 0.3 - 1.2 mg/dL   GFR, Estimated >47 >82 mL/min   Anion gap 11 5 - 15  Urinalysis, Routine w reflex microscopic -Urine, Clean Catch     Status: Abnormal   Collection Time: 08/17/22  2:21 PM  Result Value Ref Range   Color, Urine STRAW (A) YELLOW   APPearance CLEAR CLEAR   Specific Gravity, Urine 1.014 1.005 - 1.030   pH 8.0 5.0 - 8.0   Glucose, UA NEGATIVE NEGATIVE mg/dL   Hgb urine dipstick NEGATIVE NEGATIVE   Bilirubin Urine NEGATIVE NEGATIVE   Ketones, ur 20 (A) NEGATIVE mg/dL   Protein, ur 30 (A) NEGATIVE mg/dL   Nitrite NEGATIVE NEGATIVE   Leukocytes,Ua NEGATIVE NEGATIVE   RBC / HPF 0-5 0 - 5 RBC/hpf   WBC, UA 0-5 0 - 5 WBC/hpf   Bacteria, UA NONE SEEN NONE SEEN   Squamous Epithelial / HPF 0-5 0 - 5 /HPF   Mucus PRESENT   hCG, quantitative, pregnancy     Status: None   Collection Time: 08/17/22  2:21 PM  Result Value Ref Range   hCG, Beta Chain, Quant, S <1 <5 mIU/mL  Magnesium     Status: None   Collection Time: 08/17/22  2:21 PM  Result Value Ref Range   Magnesium 1.7 1.7 - 2.4 mg/dL  I-Stat beta hCG blood, ED ([facility], [facility], [facility] only)     Status: None    Collection Time: 08/17/22  3:08 PM  Result Value Ref Range   I-stat hCG, quantitative <5.0 <5 mIU/mL   Comment 3          Lipase, blood     Status: None   Collection Time: 08/17/22  3:27 PM  Result Value Ref Range   Lipase 44 11 - 51 U/L  POC Urine Pregnancy, ED (not at [facility] or [facility])     Status: None   Collection Time: 08/17/22  3:52 PM  Result Value Ref Range   Preg Test, Ur NEGATIVE NEGATIVE  SARS Coronavirus 2 by RT PCR (hospital order, performed in [facility] hospital lab) *cepheid single result test* Anterior Nasal Swab     Status: None   Collection Time: 08/17/22  4:29 PM   Specimen: Anterior Nasal Swab  Result Value Ref Range   SARS Coronavirus 2 by RT PCR NEGATIVE NEGATIVE      Assessment and Plan: Intractable nausea and vomiting -despite receiving Phenergan, Zofran, and droperidol in the emergency department the patient continued to be nauseated. - Will continue as needed medications and IV fluids and continue to monitor - The cause of the patient's intractable nausea vomiting is not clear at this time.  She is not able to provide any history as she is currently sedated likely from the droperidol.  Her CT of the abdomen pelvis is unremarkable.  Her drug screen is pending.  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 - Consider Head CT to  rule out brain lesion/ trauma/ bleed causing the intractable vomiting in the setting of new severe high blood pressure.   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  She did take her methadone this morning but threw it up.  Pharmacy will verify her dosing with the methadone clinic in the a.m. so she can receive her medication in the morning.  3.  Confabulation -On arrival she reported that she was pregnant, but CT reports that she is status post hysterectomy.  Revisit this once the patient is awake and alert.  4. Htn -she is not on any blood pressure medications as an outpatient.  Monitor and treat with  PRNs at this time.     Advance Care Planning:   Code Status: Full Code the patient is sedated and unable to have a discussion at this time.  Consults: none.    Family Communication: none  Severity of Illness: The appropriate patient status for this patient is INPATIENT. Inpatient status is judged to be reasonable and necessary in order to provide the required intensity of service to ensure the patient's safety. The patient's presenting symptoms, physical exam findings, and initial radiographic and laboratory data in the context of their chronic comorbidities is felt to place them at high risk for further clinical deterioration. Furthermore, it is not anticipated that the patient will be medically stable for discharge from the hospital within 2 midnights of admission.   * I certify that at the point of admission it is my clinical judgment that the patient will require inpatient hospital care spanning beyond 2 midnights from the point of admission due to high intensity of service, high risk for further deterioration and high frequency of surveillance required.*  Author: Buena Irish, MD 08/17/2022 10:02 PM  For on call review [email or web address].

## 2022-08-17 NOTE — ED Notes (Signed)
Admitting MD ( Dr. Dimas Chyle ) notified on patient's hypertension /Tachycardia.

## 2022-08-18 DIAGNOSIS — F1290 Cannabis use, unspecified, uncomplicated: Secondary | ICD-10-CM

## 2022-08-18 DIAGNOSIS — A059 Bacterial foodborne intoxication, unspecified: Secondary | ICD-10-CM | POA: Diagnosis not present

## 2022-08-18 DIAGNOSIS — R112 Nausea with vomiting, unspecified: Secondary | ICD-10-CM | POA: Diagnosis not present

## 2022-08-18 LAB — BASIC METABOLIC PANEL
Anion gap: 16 — ABNORMAL HIGH (ref 5–15)
BUN: 5 mg/dL — ABNORMAL LOW (ref 6–20)
CO2: 22 mmol/L (ref 22–32)
Calcium: 9.3 mg/dL (ref 8.9–10.3)
Chloride: 92 mmol/L — ABNORMAL LOW (ref 98–111)
Creatinine, Ser: 0.61 mg/dL (ref 0.44–1.00)
GFR, Estimated: >60 mL/min (ref >60)
Glucose, Bld: 121 mg/dL — ABNORMAL HIGH (ref 70–99)
Potassium: 3.1 mmol/L — ABNORMAL LOW (ref 3.5–5.1)
Sodium: 130 mmol/L — ABNORMAL LOW (ref 135–145)

## 2022-08-18 LAB — CBC
HCT: 47.9 % — ABNORMAL HIGH (ref 36.0–46.0)
Hemoglobin: 16.7 g/dL — ABNORMAL HIGH (ref 12.0–15.0)
MCH: 29.8 pg (ref 26.0–34.0)
MCHC: 34.9 g/dL (ref 30.0–36.0)
MCV: 85.5 fL (ref 80.0–100.0)
Platelets: 323 10*3/uL (ref 150–400)
RBC: 5.6 MIL/uL — ABNORMAL HIGH (ref 3.87–5.11)
RDW: 12.1 % (ref 11.5–15.5)
WBC: 15.7 10*3/uL — ABNORMAL HIGH (ref 4.0–10.5)
nRBC: 0 % (ref 0.0–0.2)

## 2022-08-18 LAB — MAGNESIUM: Magnesium: 1.8 mg/dL (ref 1.7–2.4)

## 2022-08-18 MED ORDER — POTASSIUM CHLORIDE CRYS ER 20 MEQ PO TBCR
40.0000 meq | EXTENDED_RELEASE_TABLET | Freq: Once | ORAL | Status: AC
  Administered 2022-08-18: 40 meq via ORAL
  Filled 2022-08-18: qty 2

## 2022-08-18 MED ORDER — METHADONE HCL 10 MG PO TABS
120.0000 mg | ORAL_TABLET | Freq: Every day | ORAL | Status: DC
  Filled 2022-08-18: qty 12

## 2022-08-18 MED ORDER — ONDANSETRON HCL 4 MG PO TABS
4.0000 mg | ORAL_TABLET | Freq: Three times a day (TID) | ORAL | Status: DC | PRN
  Administered 2022-08-18: 4 mg via ORAL
  Filled 2022-08-18: qty 1

## 2022-08-18 MED ORDER — MORPHINE SULFATE (PF) 4 MG/ML IV SOLN: 4.0000 mg | INTRAVENOUS | Status: DC | PRN

## 2022-08-18 MED ORDER — PROMETHAZINE HCL 12.5 MG PO TABS: 25.0000 mg | ORAL_TABLET | Freq: Four times a day (QID) | ORAL | Status: DC | PRN

## 2022-08-18 NOTE — Care Management Obs Status (Signed)
MEDICARE OBSERVATION STATUS NOTIFICATION   Patient Details  Name: Brittany Mullins MRN: [id number] Date of Birth: 09-12-1980   Medicare Observation Status Notification Given:  Yes    Lawerance Sabal, RN 08/18/2022, 3:21 PM

## 2022-08-18 NOTE — ED Notes (Signed)
ED TO INPATIENT HANDOFF REPORT  ED Nurse Name and Phone #:  [phone] Les Pou RN   S Name/Age/Gender Brittany Mullins 42 y.o. female Room/Bed: RESUSC/RESUSC  Code Status   Code Status: Full Code  Home/SNF/Other Home Patient oriented to: self, place, time, and situation Is this baseline? Yes   Triage Complete: Triage complete  Chief Complaint Intractable vomiting with nausea [R11.2]  Triage Note Pt BIB [facility] from home d/t continuous n/v since this morning. Pt reports she was at the [facility] when she began vomiting & it became more continuously, once she returned home she became very lethargic. She is [redacted] weeks pregnant with twins confirmed by her OBGYN. EMS reports 210/117, CBG 159, 4 zofran was given. Does have Hx of seizures, pre-eclampsia & brain tumor. A/Ox4.     Allergies Allergies  Allergen Reactions   Bee Venom Anaphylaxis   Toradol [Ketorolac Tromethamine] Shortness Of Breath   Benadryl [Diphenhydramine] Other (See Comments)    States she cannot breathe    Level of Care/Admitting Diagnosis ED Disposition     ED Disposition  Admit   Condition  --   Comment  Hospital Area: [facility] [[id number]]  Level of Care: Telemetry Medical [[id number]]  May admit patient to [facility] or [facility] if equivalent level of care is available:: Yes  Covid Evaluation: Asymptomatic - no recent exposure (last 10 days) testing not required  Diagnosis: Intractable vomiting with nausea [[id number]]  Admitting Physician: Buena Irish [[id number]]  Attending Physician: Buena Irish [phone]  Certification:: I certify this patient will need inpatient services for at least 2 midnights  Estimated Length of Stay: 2          B Medical/Surgery History Past Medical History:  Diagnosis Date   Addison's disease (HCC)    Brain tumor (benign) (HCC)    Hypertension    Marijuana use    Per pt: "medical marijuana patient"   Pancreatitis    PCOS (polycystic ovarian syndrome)     Pituitary tumor    Seizures (HCC)    Past Surgical History:  Procedure Laterality Date   ABDOMINAL HYSTERECTOMY     APPENDECTOMY     BIOPSY  08/16/2021   Procedure: BIOPSY;  Surgeon: Shellia Cleverly, DO;  Location: [facility];  Service: Gastroenterology;;   CESAREAN SECTION     CHOLECYSTECTOMY     ELBOW SURGERY     ESOPHAGOGASTRODUODENOSCOPY (EGD) WITH PROPOFOL N/A 08/16/2021   Procedure: ESOPHAGOGASTRODUODENOSCOPY (EGD) WITH PROPOFOL;  Surgeon: Shellia Cleverly, DO;  Location: [facility];  Service: Gastroenterology;  Laterality: N/A;   kidney stent     pancreatic stent       A IV Location/Drains/Wounds Patient Lines/Drains/Airways Status     Active Line/Drains/Airways     Name Placement date Placement time Site Days   Peripheral IV 08/17/22 20 G Left Antecubital 08/17/22  1411  Antecubital  1            Intake/Output Last 24 hours  Intake/Output Summary (Last 24 hours) at 08/18/2022 0041 Last data filed at 08/18/2022 0032 Gross per 24 hour  Intake 200 ml  Output --  Net 200 ml    Labs/Imaging Results for orders placed or performed during the hospital encounter of 08/17/22 (from the past 48 hour(s))  CBC with Differential     Status: Abnormal   Collection Time: 08/17/22  2:21 PM  Result Value Ref Range   WBC 9.6 4.0 - 10.5 K/uL   RBC 4.57 3.87 -  5.11 MIL/uL   Hemoglobin 14.0 12.0 - 15.0 g/dL   HCT 09.8 11.9 - 14.7 %   MCV 90.8 80.0 - 100.0 fL   MCH 30.6 26.0 - 34.0 pg   MCHC 33.7 30.0 - 36.0 g/dL   RDW 82.9 56.2 - 13.0 %   Platelets 250 150 - 400 K/uL   nRBC 0.0 0.0 - 0.2 %   Neutrophils Relative % 91 %   Neutro Abs 8.7 (H) 1.7 - 7.7 K/uL   Lymphocytes Relative 6 %   Lymphs Abs 0.6 (L) 0.7 - 4.0 K/uL   Monocytes Relative 3 %   Monocytes Absolute 0.3 0.1 - 1.0 K/uL   Eosinophils Relative 0 %   Eosinophils Absolute 0.0 0.0 - 0.5 K/uL   Basophils Relative 0 %   Basophils Absolute 0.0 0.0 - 0.1 K/uL   Immature Granulocytes 0 %   Abs Immature  Granulocytes 0.02 0.00 - 0.07 K/uL    Comment: Performed at [facility], [address], [place], [place] [address]  Comprehensive metabolic panel     Status: Abnormal   Collection Time: 08/17/22  2:21 PM  Result Value Ref Range   Sodium 135 135 - 145 mmol/L   Potassium 3.3 (L) 3.5 - 5.1 mmol/L   Chloride 102 98 - 111 mmol/L   CO2 22 22 - 32 mmol/L   Glucose, Bld 132 (H) 70 - 99 mg/dL    Comment: Glucose reference range applies only to samples taken after fasting for at least 8 hours.   BUN 13 6 - 20 mg/dL   Creatinine, Ser 4.69 0.44 - 1.00 mg/dL   Calcium 8.8 (L) 8.9 - 10.3 mg/dL   Total Protein 6.9 6.5 - 8.1 g/dL   Albumin 4.2 3.5 - 5.0 g/dL   AST 38 15 - 41 U/L   ALT 28 0 - 44 U/L   Alkaline Phosphatase 58 38 - 126 U/L   Total Bilirubin 0.9 0.3 - 1.2 mg/dL   GFR, Estimated >62 >95 mL/min    Comment: (NOTE) Calculated using the CKD-EPI Creatinine Equation (2021)    Anion gap 11 5 - 15    Comment: Performed at [facility], [address], [place], [place] [address]  Urinalysis, Routine w reflex microscopic -Urine, Clean Catch     Status: Abnormal   Collection Time: 08/17/22  2:21 PM  Result Value Ref Range   Color, Urine STRAW (A) YELLOW   APPearance CLEAR CLEAR   Specific Gravity, Urine 1.014 1.005 - 1.030   pH 8.0 5.0 - 8.0   Glucose, UA NEGATIVE NEGATIVE mg/dL   Hgb urine dipstick NEGATIVE NEGATIVE   Bilirubin Urine NEGATIVE NEGATIVE   Ketones, ur 20 (A) NEGATIVE mg/dL   Protein, ur 30 (A) NEGATIVE mg/dL   Nitrite NEGATIVE NEGATIVE   Leukocytes,Ua NEGATIVE NEGATIVE   RBC / HPF 0-5 0 - 5 RBC/hpf   WBC, UA 0-5 0 - 5 WBC/hpf   Bacteria, UA NONE SEEN NONE SEEN   Squamous Epithelial / HPF 0-5 0 - 5 /HPF   Mucus PRESENT     Comment: Performed at [facility], [address], [place], [place] [address]  hCG, quantitative, pregnancy     Status: None   Collection Time: 08/17/22  2:21 PM  Result Value Ref Range   hCG, Beta Chain, Quant, S <1 <5 mIU/mL     Comment:          GEST. AGE  CONC.  (mIU/mL)   <=1 WEEK        5 - 50     2 WEEKS       50 - 500     3 WEEKS       100 - 10,000     4 WEEKS     1,000 - 30,000     5 WEEKS     3,500 - 115,000   6-8 WEEKS     12,000 - 270,000    12 WEEKS     15,000 - 220,000        FEMALE AND NON-PREGNANT FEMALE:     LESS THAN 5 mIU/mL Performed at [facility], [address] [place], [place] [address]   Magnesium     Status: None   Collection Time: 08/17/22  2:21 PM  Result Value Ref Range   Magnesium 1.7 1.7 - 2.4 mg/dL    Comment: Performed at [facility], [address] [place], [place] [address]  I-Stat beta hCG blood, ED ([facility], [facility], [facility] only)     Status: None   Collection Time: 08/17/22  3:08 PM  Result Value Ref Range   I-stat hCG, quantitative <5.0 <5 mIU/mL   Comment 3            Comment:   GEST. AGE      CONC.  (mIU/mL)   <=1 WEEK        5 - 50     2 WEEKS       50 - 500     3 WEEKS       100 - 10,000     4 WEEKS     1,000 - 30,000        FEMALE AND NON-PREGNANT FEMALE:     LESS THAN 5 mIU/mL   Lipase, blood     Status: None   Collection Time: 08/17/22  3:27 PM  Result Value Ref Range   Lipase 44 11 - 51 U/L    Comment: Performed at [facility], [address], [place], [place] [address]  POC Urine Pregnancy, ED (not at [facility] or [facility])     Status: None   Collection Time: 08/17/22  3:52 PM  Result Value Ref Range   Preg Test, Ur NEGATIVE NEGATIVE    Comment:        THE SENSITIVITY OF THIS METHODOLOGY IS >24 mIU/mL   SARS Coronavirus 2 by RT PCR (hospital order, performed in [facility] hospital lab) *cepheid single result test* Anterior Nasal Swab     Status: None   Collection Time: 08/17/22  4:29 PM   Specimen: Anterior Nasal Swab  Result Value Ref Range   SARS Coronavirus 2 by RT PCR NEGATIVE NEGATIVE    Comment: Performed at [facility], [address], [place], [place] [address]  HIV Antibody (routine testing w rflx)     Status: None   Collection  Time: 08/17/22 10:25 PM  Result Value Ref Range   HIV Screen 4th Generation wRfx Non Reactive Non Reactive    Comment: Performed at [facility], [address], [place], [place] [address]   CT ABDOMEN PELVIS W CONTRAST  Result Date: 08/17/2022 CLINICAL DATA:  Left lower quadrant abdominal pain EXAM: CT ABDOMEN AND PELVIS WITH CONTRAST TECHNIQUE: Multidetector CT imaging of the abdomen and pelvis was performed using the standard protocol following bolus administration of intravenous contrast. RADIATION DOSE REDUCTION: This exam  was performed according to the departmental dose-optimization program which includes automated exposure control, adjustment of the mA and/or kV according to patient size and/or use of iterative reconstruction technique. CONTRAST:  75mL OMNIPAQUE IOHEXOL 350 MG/ML SOLN COMPARISON:  None Available. FINDINGS: Lower chest: No acute abnormality. Hepatobiliary: No focal liver abnormality is seen. Status post cholecystectomy. No biliary dilatation. Pancreas: Unremarkable. No pancreatic ductal dilatation or surrounding inflammatory changes. Spleen: Normal in size without focal abnormality. Adrenals/Urinary Tract: Adrenal glands are unremarkable. Bilateral 2-3 mm renal calculi. No evidence of hydronephrosis or ureteral calculus. Bladder is unremarkable. Stomach/Bowel: Stomach is within normal limits. Appendix not identified. No evidence of bowel wall thickening, distention, or inflammatory changes. Vascular/Lymphatic: No significant vascular findings are present. No enlarged abdominal or pelvic lymph nodes. Reproductive: Status post hysterectomy. No adnexal masses. Other: No abdominal wall hernia or abnormality. No abdominopelvic ascites. 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 No acute osseous abnormality. IMPRESSION: 1. Bilateral 2-3 mm nonobstructing renal calculi. No evidence of hydronephrosis or ureteral calculus. 2. No CT evidence of acute abdominal/pelvic  process. 3. Status post cholecystectomy and hysterectomy. 4. 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 Electronically Signed   By: Larose Hires D.O.   On: 08/17/2022 20:21   US PELVIC COMPLETE W TRANSVAGINAL AND TORSION R/O  Result Date: 08/17/2022 CLINICAL DATA:  Pelvic pain bilaterally. History of hysterectomy. Negative pregnancy test. LMP 05/12/2022. EXAM: TRANSABDOMINAL AND TRANSVAGINAL ULTRASOUND OF PELVIS DOPPLER ULTRASOUND OF OVARIES TECHNIQUE: Both transabdominal and transvaginal ultrasound examinations of the pelvis were performed. Transabdominal technique was performed for global imaging of the pelvis including uterus, ovaries, adnexal regions, and pelvic cul-de-sac. It was necessary to proceed with endovaginal exam following the transabdominal exam to visualize the ovaries. Color and duplex Doppler ultrasound was utilized to evaluate blood flow to the ovaries. COMPARISON:  None Available. FINDINGS: Uterus/Endometrium Not visualized.  Per chart review there is history of hysterectomy. Right ovary Measurements: 3.0 x 1.8 x 2.4 cm = volume: 6.6 mL. Normal appearance/no adnexal mass. Left ovary Measurements: 2.8 x 1.9 x 1.9 cm = volume: 5.2 mL. Normal appearance/no adnexal mass. Pulsed Doppler evaluation of both ovaries demonstrates normal low-resistance arterial and venous waveforms. Other findings No abnormal free fluid. IMPRESSION: 1. Bilateral ovaries appear normal. No evidence of ovarian torsion. Electronically Signed   By: Minerva Fester M.D.   On: 08/17/2022 17:56    Pending Labs Unresulted Labs (From admission, onward)     Start     Ordered   08/24/22 0500  Creatinine, serum  (enoxaparin (LOVENOX)    CrCl >/= 30 ml/min)  Weekly,   R     Comments: while on enoxaparin therapy    08/17/22 2201   08/18/22 0500  Basic metabolic panel  Tomorrow morning,   R        08/17/22 2201   08/18/22 0500  CBC  Tomorrow morning,   R        08/17/22 2201   08/18/22 0500  Magnesium  Tomorrow  morning,   R        08/17/22 2201   08/17/22 2118  Rapid urine drug screen (hospital performed)  Add-on,   AD        08/17/22 2117            Vitals/Pain Today's Vitals   08/17/22 2325 08/17/22 2330 08/18/22 0000 08/18/22 0015  BP: (!) 185/127 (!) 176/106 (!) 167/105 (!) 177/112  Pulse: 96 (!) 102 (!) 113 (!) 121  Resp: Marland Kitchen)  26 17 (!) 24 18  Temp:      TempSrc:      SpO2: 99% 100% 100% 100%  Weight:      Height:      PainSc:        Isolation Precautions Airborne and Contact precautions  Medications Medications  acetaminophen (TYLENOL) tablet 650 mg (650 mg Oral Not Given 08/17/22 1840)  methadone (DOLOPHINE) 10 MG/ML solution 120 mg (has no administration in time range)  enoxaparin (LOVENOX) injection 40 mg (40 mg Subcutaneous Given 08/17/22 2210)  acetaminophen (TYLENOL) tablet 650 mg (650 mg Oral Given 08/18/22 0022)    Or  acetaminophen (TYLENOL) suppository 650 mg ( Rectal See Alternative 08/18/22 0022)  melatonin tablet 5 mg (has no administration in time range)  promethazine (PHENERGAN) 25 mg in sodium chloride 0.9 % 50 mL IVPB (has no administration in time range)  lactated ringers infusion ( Intravenous New Bag/Given 08/17/22 2312)  potassium chloride 10 mEq in 100 mL IVPB (10 mEq Intravenous New Bag/Given 08/18/22 0033)  metoprolol tartrate (LOPRESSOR) injection 5 mg (5 mg Intravenous Given 08/17/22 2314)  hydrALAZINE (APRESOLINE) injection 10 mg (10 mg Intravenous Given 08/17/22 2326)  sodium chloride 0.9 % bolus 1,000 mL (0 mLs Intravenous Stopped 08/17/22 1615)  ondansetron (ZOFRAN) injection 4 mg (4 mg Intravenous Given 08/17/22 1441)  morphine (PF) 4 MG/ML injection 4 mg (4 mg Intravenous Given 08/17/22 1838)  droperidol (INAPSINE) 2.5 MG/ML injection 1.25 mg (1.25 mg Intravenous Given 08/17/22 1956)  potassium chloride 10 mEq in 100 mL IVPB (0 mEq Intravenous Stopped 08/17/22 2211)  iohexol (OMNIPAQUE) 350 MG/ML injection 75 mL (75 mLs Intravenous Contrast Given  08/17/22 2015)  cloNIDine (CATAPRES) tablet 0.2 mg (0.2 mg Oral Given 08/17/22 2057)  iohexol (OMNIPAQUE) 350 MG/ML injection 75 mL (75 mLs Intravenous Contrast Given 08/17/22 2358)    Mobility walks     Focused Assessments     R Recommendations: See Admitting Provider Note  Report given to:   Additional Notes:

## 2022-08-18 NOTE — Progress Notes (Signed)
Pharmacy Note  Contacted [facility] and pt currently taking 120mg  methadone solution daily. She was given #27 doses on 07/27/22. This information has been added to the home med rec.  Christoper Fabian, PharmD, BCPS Please see amion for complete clinical pharmacist phone list 08/18/2022 7:52 AM

## 2022-08-18 NOTE — Progress Notes (Signed)
Patient refused admission/navigator questions at this time.

## 2022-08-18 NOTE — Discharge Summary (Signed)
Physician Discharge Summary   Patient: Brittany Mullins MRN: [id number] DOB: 08-16-1980  Admit date:     08/17/2022  Discharge date: 08/18/22  Discharge Physician: Alberteen Sam   PCP: Luciano Cutter, PA-C     Recommendations at discharge:  Follow up with PCP Luciano Cutter for for post-hospital follow up for food poisoning in 1 week     Discharge Diagnoses: Principal hospital problem:   Food poisoning Active Problems:   Opioid dependence (HCC)   Marijuana use      Hospital Course: [name] is a 42 y.o. F with history seizures, chronic pain, chronic pancreatitis, and cyclic vomiting syndrome who presented with severe nausea vomiting and diarrhea.  In the ER, CT abdomen and pelvis unremarkable, she is unable to take p.o. and so admitted overnight for observation.      Food poisoning Patient evidently had acute onset of symptoms, including vomiting and diarrhea, after eating a chicken sandwich, that she thought it gave her food poisoning.  She was observed overnight, able to advance her diet without difficulty or vomiting or pain, and felt stable for discharge home.    Chronic pain She missed her doses of methadone in the hospital, will resume this afternoon after discharge   Pseudocyesis vs. Medical error at [facility] Interestingly, on admission, patient showed EDP an ultrasound image with her name on it and 2 fetuses and reported it was from a recent OB-Gyn appointment at [facility].  However, here, urine pregnancy test negative.  Follow up US here showed no fetuses, and in fact no uterus.  Follow up CT confirmed s/p hysterectomy, which in fact is evident on CT going back to 2014 in our system (and records in our system and [facility] going back to list a past surgical history of "Total Hysterectomy in 2009".  When asked today about this, patient could not explain the discrepancy.  Her partner felt she was not delusional, but could also not  explain the discrepancy.  Other than this isolated issue, patient showed no evidence of hallucinations, abnormal thought processes or ideation of self harm.            The [facility] Controlled Substances Registry was reviewed for this patient prior to discharge.   Consultants: None Procedures performed: Pelvic US CT abdomen and pelvis   Disposition: Home Diet recommendation:  Regular diet  DISCHARGE MEDICATION: Allergies as of 08/18/2022       Reactions   Bee Venom Anaphylaxis   Toradol [ketorolac Tromethamine] Shortness Of Breath   Benadryl [diphenhydramine] Other (See Comments)   States she cannot breathe        Medication List     STOP taking these medications    erythromycin ophthalmic ointment   ondansetron 4 MG tablet Commonly known as: ZOFRAN   pantoprazole 40 MG tablet Commonly known as: PROTONIX   promethazine 25 MG tablet Commonly known as: PHENERGAN       TAKE these medications    acetaminophen 325 MG tablet Commonly known as: TYLENOL Take 2 tablets (650 mg total) by mouth every 6 (six) hours as needed for mild pain (or Fever >/= 101).   levETIRAcetam 500 MG tablet Commonly known as: Keppra Take 1 tablet (500 mg total) by mouth 2 (two) times daily.   methadone 10 MG/ML solution Commonly known as: DOLOPHINE Take 120 mg by mouth daily.        Follow-up Information     Luciano Cutter, PA-C. Schedule an appointment as soon as possible for  a visit in 1 week(s).   Specialty: Physician Assistant Contact information: [address] [place]  [place] [address] [phone]                 Discharge Instructions     Discharge instructions   Complete by: As directed    You were admitted for vomiting.   Here, we initially thought this was another episode of your cyclic vomiting syndrome or possible your chronic pancreatitis.  But since it resolved so quickly, I assume it was just food poisoning.  Drink plenty of  fluids  Resume all your normal home medicines  Go see your primary doctor in 1 week   Increase activity slowly   Complete by: As directed        Discharge Exam: Filed Weights   08/17/22 1410  Weight: 61.2 kg    General: Pt is alert, awake, not in acute distress Cardiovascular: RRR, nl S1-S2, no murmurs appreciated.   No LE edema.   Respiratory: Normal respiratory rate and rhythm.  CTAB without rales or wheezes. Abdominal: Abdomen soft and non-tender.  No distension or HSM.   Neuro/Psych: Strength symmetric in upper and lower extremities.  Judgment and insight appear normal.   Condition at discharge: stable  The results of significant diagnostics from this hospitalization (including imaging, microbiology, ancillary and laboratory) are listed below for reference.   Imaging Studies: CT HEAD W & WO CONTRAST ( )  Result Date: 08/18/2022 CLINICAL DATA:  Initial evaluation for intractable nausea and vomiting. EXAM: CT HEAD WITHOUT AND WITH CONTRAST TECHNIQUE: Contiguous axial images were obtained from the base of the skull through the vertex without and with intravenous contrast. RADIATION DOSE REDUCTION: This exam was performed according to the departmental dose-optimization program which includes automated exposure control, adjustment of the mA and/or kV according to patient size and/or use of iterative reconstruction technique. CONTRAST:  75mL OMNIPAQUE IOHEXOL 350 MG/ML SOLN COMPARISON:  CT from 03/06/2022. FINDINGS: Brain: Cerebral volume within normal limits for patient age. No evidence for acute intracranial hemorrhage. No findings to suggest acute large vessel territory infarct. No mass lesion, midline shift, or mass effect. Ventricles are normal in size without evidence for hydrocephalus. No extra-axial fluid collection identified. Empty sella noted. Vascular: No hyperdense vessel seen prior to contrast administration. Normal intravascular enhancement seen within the brain  following contrast administration. No abnormal or pathologic enhancement. Skull: Scalp soft tissues demonstrate no acute abnormality. Calvarium intact. Sinuses/Orbits: Globes and orbital soft tissues within normal limits. Scattered mucosal thickening present about the frontoethmoidal sinuses. Paranasal sinuses are otherwise largely clear. No mastoid effusion. IMPRESSION: 1. No acute intracranial abnormality. No abnormal or pathologic enhancement. 2. Empty sella, a nonspecific finding, but can be seen in the setting of idiopathic intracranial hypertension. 3. Mild frontoethmoidal sinus disease. Electronically Signed   By: Rise Mu M.D.   On: 08/18/2022 00:40   CT ABDOMEN PELVIS W CONTRAST  Result Date: 08/17/2022 CLINICAL DATA:  Left lower quadrant abdominal pain EXAM: CT ABDOMEN AND PELVIS WITH CONTRAST TECHNIQUE: Multidetector CT imaging of the abdomen and pelvis was performed using the standard protocol following bolus administration of intravenous contrast. RADIATION DOSE REDUCTION: This exam was performed according to the departmental dose-optimization program which includes automated exposure control, adjustment of the mA and/or kV according to patient size and/or use of iterative reconstruction technique. CONTRAST:  75mL OMNIPAQUE IOHEXOL 350 MG/ML SOLN COMPARISON:  None Available. FINDINGS: Lower chest: No acute abnormality. Hepatobiliary: No focal liver abnormality is seen. Status post cholecystectomy.  No biliary dilatation. Pancreas: Unremarkable. No pancreatic ductal dilatation or surrounding inflammatory changes. Spleen: Normal in size without focal abnormality. Adrenals/Urinary Tract: Adrenal glands are unremarkable. Bilateral 2-3 mm renal calculi. No evidence of hydronephrosis or ureteral calculus. Bladder is unremarkable. Stomach/Bowel: Stomach is within normal limits. Appendix not identified. No evidence of bowel wall thickening, distention, or inflammatory changes. Vascular/Lymphatic:  No significant vascular findings are present. No enlarged abdominal or pelvic lymph nodes. Reproductive: Status post hysterectomy. No adnexal masses. Other: No abdominal wall hernia or abnormality. No abdominopelvic ascites. 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 No acute osseous abnormality. IMPRESSION: 1. Bilateral 2-3 mm nonobstructing renal calculi. No evidence of hydronephrosis or ureteral calculus. 2. No CT evidence of acute abdominal/pelvic process. 3. Status post cholecystectomy and hysterectomy. 4. 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 Electronically Signed   By: Larose Hires D.O.   On: 08/17/2022 20:21   US PELVIC COMPLETE W TRANSVAGINAL AND TORSION R/O  Result Date: 08/17/2022 CLINICAL DATA:  Pelvic pain bilaterally. History of hysterectomy. Negative pregnancy test. LMP 05/12/2022. EXAM: TRANSABDOMINAL AND TRANSVAGINAL ULTRASOUND OF PELVIS DOPPLER ULTRASOUND OF OVARIES TECHNIQUE: Both transabdominal and transvaginal ultrasound examinations of the pelvis were performed. Transabdominal technique was performed for global imaging of the pelvis including uterus, ovaries, adnexal regions, and pelvic cul-de-sac. It was necessary to proceed with endovaginal exam following the transabdominal exam to visualize the ovaries. Color and duplex Doppler ultrasound was utilized to evaluate blood flow to the ovaries. COMPARISON:  None Available. FINDINGS: Uterus/Endometrium Not visualized.  Per chart review there is history of hysterectomy. Right ovary Measurements: 3.0 x 1.8 x 2.4 cm = volume: 6.6 mL. Normal appearance/no adnexal mass. Left ovary Measurements: 2.8 x 1.9 x 1.9 cm = volume: 5.2 mL. Normal appearance/no adnexal mass. Pulsed Doppler evaluation of both ovaries demonstrates normal low-resistance arterial and venous waveforms. Other findings No abnormal free fluid. IMPRESSION: 1. Bilateral ovaries appear normal. No evidence of ovarian torsion. Electronically Signed    By: Minerva Fester M.D.   On: 08/17/2022 17:56    Microbiology: Results for orders placed or performed during the hospital encounter of 08/17/22  SARS Coronavirus 2 by RT PCR (hospital order, performed in [facility] hospital lab) *cepheid single result test* Anterior Nasal Swab     Status: None   Collection Time: 08/17/22  4:29 PM   Specimen: Anterior Nasal Swab  Result Value Ref Range Status   SARS Coronavirus 2 by RT PCR NEGATIVE NEGATIVE Final    Comment: Performed at [facility], [address] [place], [place] [address]    Labs: CBC: Recent Labs  Lab 08/17/22 1421 08/18/22 0433  WBC 9.6 15.7*  NEUTROABS 8.7*  --   HGB 14.0 16.7*  HCT 41.5 47.9*  MCV 90.8 85.5  PLT 250 323   Basic Metabolic Panel: Recent Labs  Lab 08/17/22 1421 08/18/22 0433  NA 135 130*  K 3.3* 3.1*  CL 102 92*  CO2 22 22  GLUCOSE 132* 121*  BUN 13 5*  CREATININE 0.74 0.61  CALCIUM 8.8* 9.3  MG 1.7 1.8   Liver Function Tests: Recent Labs  Lab 08/17/22 1421  AST 38  ALT 28  ALKPHOS 58  BILITOT 0.9  PROT 6.9  ALBUMIN 4.2   CBG: No results for input(s): "GLUCAP" in the last 168 hours.  Discharge time spent: approximately 35 minutes spent on discharge counseling, evaluation of patient on day of discharge, and coordination of discharge planning with nursing, social  work, pharmacy and case management  Signed: Alberteen Sam, MD [facility] 08/18/2022

## 2022-08-18 NOTE — Plan of Care (Signed)

## 2022-08-18 NOTE — Care Management CC44 (Signed)
Condition Code 44 Documentation Completed  Patient Details  Name: Brittany Mullins MRN: [id number] Date of Birth: 1981/01/18   Condition Code 44 given:  Yes Patient signature on Condition Code 44 notice:  Yes Documentation of 2 MD's agreement:  Yes Code 44 added to claim:  Yes    Lawerance Sabal, RN 08/18/2022, 3:21 PM

## 2022-08-30 ENCOUNTER — Emergency Department (HOSPITAL_COMMUNITY): Payer: Medicare Other

## 2022-08-30 ENCOUNTER — Other Ambulatory Visit: Payer: Self-pay

## 2022-08-30 ENCOUNTER — Emergency Department (HOSPITAL_COMMUNITY)
Admission: EM | Admit: 2022-08-30 | Discharge: 2022-08-30 | Disposition: A | Discharge status: Home / Self Care | Payer: Medicare Other | Attending: Emergency Medicine | Admitting: Emergency Medicine

## 2022-08-30 DIAGNOSIS — F419 Anxiety disorder, unspecified: Secondary | ICD-10-CM | POA: Diagnosis not present

## 2022-08-30 DIAGNOSIS — R42 Dizziness and giddiness: Secondary | ICD-10-CM | POA: Insufficient documentation

## 2022-08-30 DIAGNOSIS — R0789 Other chest pain: Secondary | ICD-10-CM | POA: Insufficient documentation

## 2022-08-30 DIAGNOSIS — R112 Nausea with vomiting, unspecified: Secondary | ICD-10-CM | POA: Diagnosis not present

## 2022-08-30 DIAGNOSIS — E876 Hypokalemia: Secondary | ICD-10-CM

## 2022-08-30 DIAGNOSIS — R072 Precordial pain: Secondary | ICD-10-CM | POA: Insufficient documentation

## 2022-08-30 LAB — COMPREHENSIVE METABOLIC PANEL
ALT: 35 U/L (ref 0–44)
AST: 58 U/L — ABNORMAL HIGH (ref 15–41)
Albumin: 3.5 g/dL (ref 3.5–5.0)
Alkaline Phosphatase: 47 U/L (ref 38–126)
Anion gap: 9 (ref 5–15)
BUN: 11 mg/dL (ref 6–20)
CO2: 25 mmol/L (ref 22–32)
Calcium: 8.3 mg/dL — ABNORMAL LOW (ref 8.9–10.3)
Chloride: 100 mmol/L (ref 98–111)
Creatinine, Ser: 0.82 mg/dL (ref 0.44–1.00)
GFR, Estimated: >60 mL/min (ref >60)
Glucose, Bld: 91 mg/dL (ref 70–99)
Potassium: 3.3 mmol/L — ABNORMAL LOW (ref 3.5–5.1)
Sodium: 134 mmol/L — ABNORMAL LOW (ref 135–145)
Total Bilirubin: 0.6 mg/dL (ref 0.3–1.2)
Total Protein: 6.1 g/dL — ABNORMAL LOW (ref 6.5–8.1)

## 2022-08-30 LAB — URINALYSIS, ROUTINE W REFLEX MICROSCOPIC
Bacteria, UA: NONE SEEN
Bilirubin Urine: NEGATIVE
Glucose, UA: NEGATIVE mg/dL
Hgb urine dipstick: NEGATIVE
Ketones, ur: NEGATIVE mg/dL
Nitrite: NEGATIVE
Protein, ur: NEGATIVE mg/dL
Specific Gravity, Urine: 1.013 (ref 1.005–1.030)
pH: 5 (ref 5.0–8.0)

## 2022-08-30 LAB — CBC WITH DIFFERENTIAL/PLATELET
Abs Immature Granulocytes: 0.01 10*3/uL (ref 0.00–0.07)
Basophils Absolute: 0.1 10*3/uL (ref 0.0–0.1)
Basophils Relative: 1 %
Eosinophils Absolute: 0.2 10*3/uL (ref 0.0–0.5)
Eosinophils Relative: 3 %
HCT: 38 % (ref 36.0–46.0)
Hemoglobin: 12.6 g/dL (ref 12.0–15.0)
Immature Granulocytes: 0 %
Lymphocytes Relative: 35 %
Lymphs Abs: 2.3 10*3/uL (ref 0.7–4.0)
MCH: 30 pg (ref 26.0–34.0)
MCHC: 33.2 g/dL (ref 30.0–36.0)
MCV: 90.5 fL (ref 80.0–100.0)
Monocytes Absolute: 0.6 10*3/uL (ref 0.1–1.0)
Monocytes Relative: 9 %
Neutro Abs: 3.5 10*3/uL (ref 1.7–7.7)
Neutrophils Relative %: 52 %
Platelets: 220 10*3/uL (ref 150–400)
RBC: 4.2 MIL/uL (ref 3.87–5.11)
RDW: 12.5 % (ref 11.5–15.5)
WBC: 6.7 10*3/uL (ref 4.0–10.5)
nRBC: 0 % (ref 0.0–0.2)

## 2022-08-30 LAB — TROPONIN I (HIGH SENSITIVITY): Troponin I (High Sensitivity): 3 ng/L (ref <18)

## 2022-08-30 MED ORDER — POTASSIUM CHLORIDE CRYS ER 20 MEQ PO TBCR
40.0000 meq | EXTENDED_RELEASE_TABLET | Freq: Once | ORAL | Status: AC
  Administered 2022-08-30: 40 meq via ORAL
  Filled 2022-08-30: qty 4

## 2022-08-30 MED ORDER — DROPERIDOL 2.5 MG/ML IJ SOLN
2.5000 mg | Freq: Once | INTRAMUSCULAR | Status: AC
  Administered 2022-08-30: 2.5 mg via INTRAVENOUS
  Filled 2022-08-30: qty 2

## 2022-08-30 MED ORDER — DROPERIDOL 2.5 MG/ML IJ SOLN: 1.2500 mg | Freq: Once | INTRAMUSCULAR | Status: DC

## 2022-08-30 MED ORDER — METHYLPREDNISOLONE SODIUM SUCC 40 MG IJ SOLR
40.0000 mg | INTRAMUSCULAR | Status: AC
  Administered 2022-08-30: 40 mg via INTRAVENOUS
  Filled 2022-08-30: qty 1

## 2022-08-30 MED ORDER — SODIUM CHLORIDE 0.9 % IV BOLUS
1000.0000 mL | Freq: Once | INTRAVENOUS | Status: AC
  Administered 2022-08-30: 1000 mL via INTRAVENOUS

## 2022-08-30 NOTE — Discharge Instructions (Addendum)
It was our pleasure to provide your ER care today - we hope that you feel better.  Drink plenty of fluids/stay well hydrated. From today's labs, your potassium level is mildly low - eat plenty of fruits and vegetables, and follow up with primary care doctor in one week.   Note that increasingly we are seeing a recurrent abdominal pain and/or recurrent vomiting syndrome called Cannabinoid Hyperemesis Syndrome - see attached info - in these cases avoiding marijuana use will prevent symptoms from recurrent (note that symptoms can persist for a few weeks if history of heavy marijuana use as it can take time to get out of system).   Return to ER right away  if worse, fevers, new symptoms, new or worsening or severe abdominal pain, persistent vomiting, fainting/severe dizziness, chest pain, trouble breathing, or other emergency concern.  You were given pain meds in the ER - no driving for the next 6 hours.

## 2022-08-30 NOTE — ED Provider Notes (Signed)
[place] EMERGENCY DEPARTMENT AT [facility] Provider Note   CSN: [id number] Arrival date & time: 08/30/22  0448     History  Chief Complaint  Patient presents with   Dizziness    Brittany Mullins is a 42 y.o. female.  The history is provided by the patient.  Dizziness Quality:  Lightheadedness Severity:  Severe Onset quality:  Sudden Timing:  Constant Progression:  Unchanged Chronicity:  New Context: not with medication   Relieved by:  Nothing Worsened by:  Nothing Ineffective treatments:  None tried Associated symptoms: chest pain, nausea and vomiting   Associated symptoms: no blood in stool and no palpitations   Risk factors: no anemia   Patient with Addison's Disease who presents with nausea and vomiting and lightheadedness and chest discomfort and feeling as if she is having a "flare".       Home Medications Prior to Admission medications   Medication Sig Start Date End Date Taking? Authorizing Provider  acetaminophen (TYLENOL) 325 MG tablet Take 2 tablets (650 mg total) by mouth every 6 (six) hours as needed for mild pain (or Fever >/= 101). 08/21/21   Sheikh, Omair Latif, DO  levETIRAcetam (KEPPRA) 500 MG tablet Take 1 tablet (500 mg total) by mouth 2 (two) times daily. 03/06/22   Alicia Amel, MD  methadone (DOLOPHINE) 10 MG/ML solution Take 120 mg by mouth daily.    [provider]      Allergies    Bee venom, Toradol [ketorolac tromethamine], and Benadryl [diphenhydramine]    Review of Systems   Review of Systems  Constitutional:  Negative for fever.  HENT:  Negative for facial swelling.   Eyes:  Negative for redness.  Respiratory:  Negative for wheezing and stridor.   Cardiovascular:  Positive for chest pain. Negative for palpitations and leg swelling.  Gastrointestinal:  Positive for nausea and vomiting. Negative for blood in stool.  Neurological:  Negative for light-headedness.  All other systems reviewed and are  negative.   Physical Exam Updated Vital Signs BP (!) 129/95 (BP Location: Right Arm)   Pulse 75   Temp 97.9 F (36.6 C)   Resp 17   Ht 5\' 2"  (1.575 m)   Wt 61.2 kg   SpO2 96%   BMI 24.69 kg/m  Physical Exam Vitals and nursing note reviewed. Exam conducted with a chaperone present (nurse present during history and physical).  Constitutional:      General: She is not in acute distress.    Appearance: She is well-developed.  HENT:     Head: Normocephalic and atraumatic.     Nose: Nose normal.     Mouth/Throat:     Mouth: Mucous membranes are moist.     Pharynx: Oropharynx is clear.  Eyes:     Pupils: Pupils are equal, round, and reactive to light.  Cardiovascular:     Rate and Rhythm: Normal rate and regular rhythm.     Pulses: Normal pulses.     Heart sounds: Normal heart sounds.  Pulmonary:     Effort: Pulmonary effort is normal. No respiratory distress.     Breath sounds: Normal breath sounds.  Abdominal:     General: Bowel sounds are normal. There is no distension.     Palpations: Abdomen is soft.     Tenderness: There is no abdominal tenderness. There is no guarding or rebound.  Genitourinary:    Vagina: No vaginal discharge.  Musculoskeletal:        General: Normal  range of motion.     Cervical back: Normal range of motion and neck supple.  Skin:    General: Skin is dry.     Capillary Refill: Capillary refill takes less than 2 seconds.     Findings: No erythema or rash.  Neurological:     Mental Status: She is alert.     GCS: GCS eye subscore is 4. GCS verbal subscore is 5. GCS motor subscore is 6.     Deep Tendon Reflexes: Reflexes normal.  Psychiatric:        Mood and Affect: Mood is anxious.     ED Results / Procedures / Treatments   Labs (all labs ordered are listed, but only abnormal results are displayed) Results for orders placed or performed during the hospital encounter of 08/30/22  CBC with Differential  Result Value Ref Range   WBC 6.7 4.0  - 10.5 K/uL   RBC 4.20 3.87 - 5.11 MIL/uL   Hemoglobin 12.6 12.0 - 15.0 g/dL   HCT 16.1 09.6 - 04.5 %   MCV 90.5 80.0 - 100.0 fL   MCH 30.0 26.0 - 34.0 pg   MCHC 33.2 30.0 - 36.0 g/dL   RDW 40.9 81.1 - 91.4 %   Platelets 220 150 - 400 K/uL   nRBC 0.0 0.0 - 0.2 %   Neutrophils Relative % 52 %   Neutro Abs 3.5 1.7 - 7.7 K/uL   Lymphocytes Relative 35 %   Lymphs Abs 2.3 0.7 - 4.0 K/uL   Monocytes Relative 9 %   Monocytes Absolute 0.6 0.1 - 1.0 K/uL   Eosinophils Relative 3 %   Eosinophils Absolute 0.2 0.0 - 0.5 K/uL   Basophils Relative 1 %   Basophils Absolute 0.1 0.0 - 0.1 K/uL   Immature Granulocytes 0 %   Abs Immature Granulocytes 0.01 0.00 - 0.07 K/uL  Comprehensive metabolic panel  Result Value Ref Range   Sodium 134 (L) 135 - 145 mmol/L   Potassium 3.3 (L) 3.5 - 5.1 mmol/L   Chloride 100 98 - 111 mmol/L   CO2 25 22 - 32 mmol/L   Glucose, Bld 91 70 - 99 mg/dL   BUN 11 6 - 20 mg/dL   Creatinine, Ser 7.82 0.44 - 1.00 mg/dL   Calcium 8.3 (L) 8.9 - 10.3 mg/dL   Total Protein 6.1 (L) 6.5 - 8.1 g/dL   Albumin 3.5 3.5 - 5.0 g/dL   AST 58 (H) 15 - 41 U/L   ALT 35 0 - 44 U/L   Alkaline Phosphatase 47 38 - 126 U/L   Total Bilirubin 0.6 0.3 - 1.2 mg/dL   GFR, Estimated >95 >62 mL/min   Anion gap 9 5 - 15  Troponin I (High Sensitivity)  Result Value Ref Range   Troponin I (High Sensitivity) 3 <18 ng/L   DG Chest Portable 1 View  Result Date: 08/30/2022 CLINICAL DATA:  Chest pain and dizziness. EXAM: PORTABLE CHEST 1 VIEW COMPARISON:  Portable chest 08/21/2021 FINDINGS: The heart size and mediastinal contours are within normal limits. Both lungs are clear. The visualized skeletal structures are unremarkable. Multiple overlying monitor wires. IMPRESSION: No active disease. Electronically Signed   By: Almira Bar M.D.   On: 08/30/2022 06:11   CT HEAD W & WO CONTRAST ( )  Result Date: 08/18/2022 CLINICAL DATA:  Initial evaluation for intractable nausea and vomiting. EXAM: CT  HEAD WITHOUT AND WITH CONTRAST TECHNIQUE: Contiguous axial images were obtained from the base of the skull through  the vertex without and with intravenous contrast. RADIATION DOSE REDUCTION: This exam was performed according to the departmental dose-optimization program which includes automated exposure control, adjustment of the mA and/or kV according to patient size and/or use of iterative reconstruction technique. CONTRAST:  75mL OMNIPAQUE IOHEXOL 350 MG/ML SOLN COMPARISON:  CT from 03/06/2022. FINDINGS: Brain: Cerebral volume within normal limits for patient age. No evidence for acute intracranial hemorrhage. No findings to suggest acute large vessel territory infarct. No mass lesion, midline shift, or mass effect. Ventricles are normal in size without evidence for hydrocephalus. No extra-axial fluid collection identified. Empty sella noted. Vascular: No hyperdense vessel seen prior to contrast administration. Normal intravascular enhancement seen within the brain following contrast administration. No abnormal or pathologic enhancement. Skull: Scalp soft tissues demonstrate no acute abnormality. Calvarium intact. Sinuses/Orbits: Globes and orbital soft tissues within normal limits. Scattered mucosal thickening present about the frontoethmoidal sinuses. Paranasal sinuses are otherwise largely clear. No mastoid effusion. IMPRESSION: 1. No acute intracranial abnormality. No abnormal or pathologic enhancement. 2. Empty sella, a nonspecific finding, but can be seen in the setting of idiopathic intracranial hypertension. 3. Mild frontoethmoidal sinus disease. Electronically Signed   By: Rise Mu M.D.   On: 08/18/2022 00:40   CT ABDOMEN PELVIS W CONTRAST  Result Date: 08/17/2022 CLINICAL DATA:  Left lower quadrant abdominal pain EXAM: CT ABDOMEN AND PELVIS WITH CONTRAST TECHNIQUE: Multidetector CT imaging of the abdomen and pelvis was performed using the standard protocol following bolus administration  of intravenous contrast. RADIATION DOSE REDUCTION: This exam was performed according to the departmental dose-optimization program which includes automated exposure control, adjustment of the mA and/or kV according to patient size and/or use of iterative reconstruction technique. CONTRAST:  75mL OMNIPAQUE IOHEXOL 350 MG/ML SOLN COMPARISON:  None Available. FINDINGS: Lower chest: No acute abnormality. Hepatobiliary: No focal liver abnormality is seen. Status post cholecystectomy. No biliary dilatation. Pancreas: Unremarkable. No pancreatic ductal dilatation or surrounding inflammatory changes. Spleen: Normal in size without focal abnormality. Adrenals/Urinary Tract: Adrenal glands are unremarkable. Bilateral 2-3 mm renal calculi. No evidence of hydronephrosis or ureteral calculus. Bladder is unremarkable. Stomach/Bowel: Stomach is within normal limits. Appendix not identified. No evidence of bowel wall thickening, distention, or inflammatory changes. Vascular/Lymphatic: No significant vascular findings are present. No enlarged abdominal or pelvic lymph nodes. Reproductive: Status post hysterectomy. No adnexal masses. Other: No abdominal wall hernia or abnormality. No abdominopelvic ascites. 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 No acute osseous abnormality. IMPRESSION: 1. Bilateral 2-3 mm nonobstructing renal calculi. No evidence of hydronephrosis or ureteral calculus. 2. No CT evidence of acute abdominal/pelvic process. 3. Status post cholecystectomy and hysterectomy. 4. 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 Electronically Signed   By: Larose Hires D.O.   On: 08/17/2022 20:21   US PELVIC COMPLETE W TRANSVAGINAL AND TORSION R/O  Result Date: 08/17/2022 CLINICAL DATA:  Pelvic pain bilaterally. History of hysterectomy. Negative pregnancy test. LMP 05/12/2022. EXAM: TRANSABDOMINAL AND TRANSVAGINAL ULTRASOUND OF PELVIS DOPPLER ULTRASOUND OF OVARIES TECHNIQUE: Both  transabdominal and transvaginal ultrasound examinations of the pelvis were performed. Transabdominal technique was performed for global imaging of the pelvis including uterus, ovaries, adnexal regions, and pelvic cul-de-sac. It was necessary to proceed with endovaginal exam following the transabdominal exam to visualize the ovaries. Color and duplex Doppler ultrasound was utilized to evaluate blood flow to the ovaries. COMPARISON:  None Available. FINDINGS: Uterus/Endometrium Not visualized.  Per chart review there is history of hysterectomy. Right ovary Measurements: 3.0 x 1.8  x 2.4 cm = volume: 6.6 mL. Normal appearance/no adnexal mass. Left ovary Measurements: 2.8 x 1.9 x 1.9 cm = volume: 5.2 mL. Normal appearance/no adnexal mass. Pulsed Doppler evaluation of both ovaries demonstrates normal low-resistance arterial and venous waveforms. Other findings No abnormal free fluid. IMPRESSION: 1. Bilateral ovaries appear normal. No evidence of ovarian torsion. Electronically Signed   By: Minerva Fester M.D.   On: 08/17/2022 17:56    EKG EKG Interpretation  Date/Time:  Thursday Aug 30 2022 05:08:07 EDT Ventricular Rate:  64 PR Interval:  172 QRS Duration: 92 QT Interval:  462 QTC Calculation: 476 R Axis:   95 Text Interpretation: Normal sinus rhythm with sinus arrhythmia Confirmed by Arlington Sigmund ([id number]) on 08/30/2022 6:27:52 AM  Radiology DG Chest Portable 1 View  Result Date: 08/30/2022 CLINICAL DATA:  Chest pain and dizziness. EXAM: PORTABLE CHEST 1 VIEW COMPARISON:  Portable chest 08/21/2021 FINDINGS: The heart size and mediastinal contours are within normal limits. Both lungs are clear. The visualized skeletal structures are unremarkable. Multiple overlying monitor wires. IMPRESSION: No active disease. Electronically Signed   By: Almira Bar M.D.   On: 08/30/2022 06:11    Procedures Procedures    Medications Ordered in ED Medications  methylPREDNISolone sodium succinate (SOLU-MEDROL) 40  mg/mL injection 40 mg (has no administration in time range)  sodium chloride 0.9 % bolus 1,000 mL (1,000 mLs Intravenous New Bag/Given 08/30/22 0533)  droperidol (INAPSINE) 2.5 MG/ML injection 2.5 mg (2.5 mg Intravenous Given 08/30/22 0534)    ED Course/ Medical Decision Making/ A&P                             Medical Decision Making Patient with Addison's who presents with chest pain, nausea and vomiting   Amount and/or Complexity of Data Reviewed External Data Reviewed: notes.    Details: Previous notes reviewed.   Labs: ordered.    Details: All labs reviewed: normal white count 6.7, normal hemoglobin 12.6, normal platelet count. Sodium 134, potassium slight low 3.3, normal creatinine .82, first troponin negative 3 Radiology: ordered.    Details: CTA ordered by me  ECG/medicine tests: ordered and independent interpretation performed. Decision-making details documented in ED Course.  Risk Prescription drug management. Risk Details: No further emesis after droperidol, patient is resting comfortably.  IV steroids ordered given patient has Addison's disease and is reporting being off her medication for some time.      Final Clinical Impression(s) / ED Diagnoses Final diagnoses:  Nausea and vomiting, unspecified vomiting type  Precordial pain   Signed out to Dr. Denton Lank pending delta troponin and CTA Rx / DC Orders ED Discharge Orders     None         Brittany Duffner, MD 08/30/22 [id number]

## 2022-08-30 NOTE — ED Provider Notes (Signed)
Pt reports feeling improved. No nausea/vomiting. No dizziness.   Pt denies any chest pain or discomfort. No sob or unusual doe. Currently, hr 68, rr 14, pulse ox 99% room air.   Pt appears stable for d/c.  Rec pcp f/u.  Return precautions provided.      Cathren Laine, MD 08/30/22 [phone]

## 2022-08-30 NOTE — ED Triage Notes (Signed)
Patient coming to ED for evaluation of dizziness.  Reports has Addison's Disease and feels that this might "be a crisis."  Has had dizziness, diarrhea, nausea, and vomiting.  C/o chest pressure and states "I just feel funny."  No reports of SHOB.  No recent LOC or changes in vision.

## 2022-10-01 ENCOUNTER — Emergency Department (HOSPITAL_BASED_OUTPATIENT_CLINIC_OR_DEPARTMENT_OTHER)
Admission: EM | Admit: 2022-10-01 | Discharge: 2022-10-01 | Disposition: A | Discharge status: Home / Self Care | Payer: Medicare Other | Attending: Emergency Medicine | Admitting: Emergency Medicine

## 2022-10-01 ENCOUNTER — Other Ambulatory Visit: Payer: Self-pay

## 2022-10-01 ENCOUNTER — Encounter (HOSPITAL_BASED_OUTPATIENT_CLINIC_OR_DEPARTMENT_OTHER): Payer: Self-pay

## 2022-10-01 DIAGNOSIS — R112 Nausea with vomiting, unspecified: Secondary | ICD-10-CM | POA: Insufficient documentation

## 2022-10-01 LAB — COMPREHENSIVE METABOLIC PANEL
ALT: 14 U/L (ref 0–44)
AST: 30 U/L (ref 15–41)
Albumin: 3.9 g/dL (ref 3.5–5.0)
Alkaline Phosphatase: 38 U/L (ref 38–126)
Anion gap: 9 (ref 5–15)
BUN: 13 mg/dL (ref 6–20)
CO2: 27 mmol/L (ref 22–32)
Calcium: 8.5 mg/dL — ABNORMAL LOW (ref 8.9–10.3)
Chloride: 101 mmol/L (ref 98–111)
Creatinine, Ser: 0.83 mg/dL (ref 0.44–1.00)
GFR, Estimated: >60 mL/min (ref >60)
Glucose, Bld: 70 mg/dL (ref 70–99)
Potassium: 4 mmol/L (ref 3.5–5.1)
Sodium: 137 mmol/L (ref 135–145)
Total Bilirubin: 0.6 mg/dL (ref 0.3–1.2)
Total Protein: 6.8 g/dL (ref 6.5–8.1)

## 2022-10-01 LAB — URINALYSIS, ROUTINE W REFLEX MICROSCOPIC
Bilirubin Urine: NEGATIVE
Glucose, UA: NEGATIVE mg/dL
Hgb urine dipstick: NEGATIVE
Ketones, ur: NEGATIVE mg/dL
Leukocytes,Ua: NEGATIVE
Nitrite: NEGATIVE
Protein, ur: NEGATIVE mg/dL
Specific Gravity, Urine: 1.005 (ref 1.005–1.030)
pH: 6 (ref 5.0–8.0)

## 2022-10-01 LAB — CBC
HCT: 38.7 % (ref 36.0–46.0)
Hemoglobin: 12.9 g/dL (ref 12.0–15.0)
MCH: 30.4 pg (ref 26.0–34.0)
MCHC: 33.3 g/dL (ref 30.0–36.0)
MCV: 91.3 fL (ref 80.0–100.0)
Platelets: 192 10*3/uL (ref 150–400)
RBC: 4.24 MIL/uL (ref 3.87–5.11)
RDW: 12.3 % (ref 11.5–15.5)
WBC: 8.5 10*3/uL (ref 4.0–10.5)
nRBC: 0 % (ref 0.0–0.2)

## 2022-10-01 LAB — LIPASE, BLOOD: Lipase: 45 U/L (ref 11–51)

## 2022-10-01 MED ORDER — ONDANSETRON 4 MG PO TBDP
4.0000 mg | ORAL_TABLET | Freq: Once | ORAL | Status: AC
  Administered 2022-10-01: 4 mg via ORAL
  Filled 2022-10-01: qty 1

## 2022-10-01 MED ORDER — PROMETHAZINE HCL 25 MG/ML IJ SOLN
INTRAMUSCULAR | Status: AC
  Filled 2022-10-01: qty 1

## 2022-10-01 MED ORDER — SODIUM CHLORIDE 0.9 % IV BOLUS
1000.0000 mL | Freq: Once | INTRAVENOUS | Status: AC
  Administered 2022-10-01: 1000 mL via INTRAVENOUS

## 2022-10-01 MED ORDER — LORAZEPAM 1 MG PO TABS: 0.5000 mg | ORAL_TABLET | Freq: Once | ORAL | Status: DC

## 2022-10-01 MED ORDER — SODIUM CHLORIDE 0.9 % IV SOLN
12.5000 mg | Freq: Once | INTRAVENOUS | Status: AC
  Administered 2022-10-01: 12.5 mg via INTRAVENOUS
  Filled 2022-10-01: qty 0.5

## 2022-10-01 NOTE — ED Notes (Signed)
DC papers reviewed. No questions or concerns. No signs of distress. Pt assisted to wheelchair and out to lobby. Appropriate measures for safety taken. 

## 2022-10-01 NOTE — Discharge Instructions (Signed)
Today we recommended you taking hydrocortisone for your Addison's disease, however you declined, you may flare, please read the information above, for further detail regarding this.  If you feel like your nausea, vomiting, is intractable, you have severe abdominal pain, weakness or feel like you are going to pass out return to the ER.

## 2022-10-01 NOTE — ED Notes (Signed)
Has tolerated 8oz soda since arrival- after zofran in triage.

## 2022-10-01 NOTE — ED Provider Notes (Signed)
[place] EMERGENCY DEPARTMENT AT [facility] Provider Note   CSN: [id number] Arrival date & time: 10/01/22  1721     History  Chief Complaint  Patient presents with   Vomiting    Brittany Mullins is a 42 y.o. female, history of Addison's disease, who presents to the ED secondary to nausea, vomiting, after getting sunburned yesterday.  She states that she got a sunburn from sleeping while laying on her stomach, and has pain all over her body now.  She denies any chest pain, shortness of breath, or abdominal pain.  Pain is where the burn is.  She is not on any current steroids for her Addison's disease.  She does not feel like she is in a flare.  She states she is just feels very sick and feels like she almost has sun poisoning.  Home Medications Prior to Admission medications   Medication Sig Start Date End Date Taking? Authorizing Provider  acetaminophen (TYLENOL) 325 MG tablet Take 2 tablets (650 mg total) by mouth every 6 (six) hours as needed for mild pain (or Fever >/= 101). 08/21/21   Sheikh, Omair Latif, DO  levETIRAcetam (KEPPRA) 500 MG tablet Take 1 tablet (500 mg total) by mouth 2 (two) times daily. 03/06/22   Alicia Amel, MD  methadone (DOLOPHINE) 10 MG/ML solution Take 120 mg by mouth daily.    [provider]      Allergies    Bee venom, Toradol [ketorolac tromethamine], and Benadryl [diphenhydramine]    Review of Systems   Review of Systems  Constitutional:  Positive for fatigue.  Gastrointestinal:  Positive for nausea and vomiting. Negative for abdominal pain.    Physical Exam Updated Vital Signs BP (!) 163/95   Pulse 78   Temp 98.2 F (36.8 C) (Oral)   Resp 16   Ht 5\' 2"  (1.575 m)   Wt 63.5 kg   SpO2 100%   BMI 25.61 kg/m  Physical Exam Vitals and nursing note reviewed.  Constitutional:      General: She is not in acute distress.    Appearance: She is well-developed.  HENT:     Head: Normocephalic and atraumatic.   Eyes:     Conjunctiva/sclera: Conjunctivae normal.  Cardiovascular:     Rate and Rhythm: Normal rate and regular rhythm.     Heart sounds: No murmur heard. Pulmonary:     Effort: Pulmonary effort is normal. No respiratory distress.     Breath sounds: Normal breath sounds.  Abdominal:     Palpations: Abdomen is soft.     Tenderness: There is no abdominal tenderness.  Musculoskeletal:        General: No swelling.     Cervical back: Neck supple.  Skin:    General: Skin is warm and dry.     Capillary Refill: Capillary refill takes less than 2 seconds.     Comments: Diffuse erythema to face, arms, legs no evidence of any blistering.  Neurological:     Mental Status: She is alert.  Psychiatric:        Mood and Affect: Mood normal.     ED Results / Procedures / Treatments   Labs (all labs ordered are listed, but only abnormal results are displayed) Labs Reviewed  COMPREHENSIVE METABOLIC PANEL - Abnormal; Notable for the following components:      Result Value   Calcium 8.5 (*)    All other components within normal limits  URINALYSIS, ROUTINE W REFLEX MICROSCOPIC - Abnormal; Notable  for the following components:   Color, Urine COLORLESS (*)    All other components within normal limits  LIPASE, BLOOD  CBC    EKG None  Radiology No results found.  Procedures Procedures    Medications Ordered in ED Medications  promethazine (PHENERGAN) 25 MG/ML injection (  Not Given 10/01/22 2204)  ondansetron (ZOFRAN-ODT) disintegrating tablet 4 mg (4 mg Oral Given 10/01/22 1736)  sodium chloride 0.9 % bolus 1,000 mL (0 mLs Intravenous Stopped 10/01/22 2154)  promethazine (PHENERGAN) 12.5 mg in sodium chloride 0.9 % 50 mL IVPB (0 mg Intravenous Stopped 10/01/22 2220)    ED Course/ Medical Decision Making/ A&P                             Medical Decision Making Patient is a 42 year old female, here for nausea and vomiting.  Occurred after getting sunburned yesterday.  She has Addison's  disease, and did not know if she is in a flare.  On my eval, I am concerned she may have an Addison's disease flare, however she is refusing any kind of hydrocortisone or steroid.  I recommended this, and she adamantly declined she is aware of the risk associated with this.  We will obtain labs, for further evaluation.  She has no abdominal pain.  We also obtained an EKG just to eval for any ST elevation.  Amount and/or Complexity of Data Reviewed Labs: ordered.    Details: Labs within normal limits ECG/medicine tests:     Details: Normal sinus rhythm Discussion of management or test interpretation with external provider(s): Discussed with patient, again repeated recommendations for the hydrocortisone, she refused.  Was able to tolerate p.o. intake, nausea, vomiting improved.  Her QTc is elevated, thus I will not send her home with any medications.  Recommend that she sniff alcohol swabs at home, as this is proven to help reduce the nausea.  I advised her to follow-up with her primary care doctor, and she voiced understanding  Risk Prescription drug management.    Final Clinical Impression(s) / ED Diagnoses Final diagnoses:  Nausea and vomiting, unspecified vomiting type    Rx / DC Orders ED Discharge Orders     None         Nazaire Cordial, [place]ley [place], PA 10/01/22 2250    Alvira Monday, MD 10/02/22 2254

## 2022-10-01 NOTE — ED Triage Notes (Signed)
Patient here POV from Home.  Endorses N/V since this AM. Noted a Sunburn yesterday and states it may have agitated her Addison's Disease.   NAD Noted during triage. A&Ox4. GCS 15. BIB Wheelchair.

## 2023-01-10 ENCOUNTER — Other Ambulatory Visit (HOSPITAL_COMMUNITY): Payer: Self-pay | Admitting: Pain Medicine

## 2023-01-10 DIAGNOSIS — R569 Unspecified convulsions: Secondary | ICD-10-CM

## 2023-01-10 DIAGNOSIS — D497 Neoplasm of unspecified behavior of endocrine glands and other parts of nervous system: Secondary | ICD-10-CM

## 2023-01-17 ENCOUNTER — Other Ambulatory Visit: Payer: Self-pay | Admitting: Pain Medicine

## 2023-01-17 DIAGNOSIS — D497 Neoplasm of unspecified behavior of endocrine glands and other parts of nervous system: Secondary | ICD-10-CM

## 2023-02-03 ENCOUNTER — Other Ambulatory Visit: Payer: Medicare Other

## 2023-03-16 ENCOUNTER — Other Ambulatory Visit: Payer: Self-pay

## 2023-03-16 ENCOUNTER — Emergency Department (HOSPITAL_COMMUNITY): Payer: Medicare Other

## 2023-03-16 ENCOUNTER — Emergency Department (HOSPITAL_COMMUNITY)
Admission: EM | Admit: 2023-03-16 | Discharge: 2023-03-16 | Disposition: A | Discharge status: Home / Self Care | Payer: Medicare Other | Attending: Emergency Medicine | Admitting: Emergency Medicine

## 2023-03-16 ENCOUNTER — Encounter (HOSPITAL_COMMUNITY): Payer: Self-pay | Admitting: *Deleted

## 2023-03-16 DIAGNOSIS — I10 Essential (primary) hypertension: Secondary | ICD-10-CM | POA: Insufficient documentation

## 2023-03-16 DIAGNOSIS — R062 Wheezing: Secondary | ICD-10-CM | POA: Diagnosis present

## 2023-03-16 LAB — CBC WITH DIFFERENTIAL/PLATELET
Abs Immature Granulocytes: 0.04 10*3/uL (ref 0.00–0.07)
Basophils Absolute: 0.1 10*3/uL (ref 0.0–0.1)
Basophils Relative: 1 %
Eosinophils Absolute: 0.2 10*3/uL (ref 0.0–0.5)
Eosinophils Relative: 2 %
HCT: 40.1 % (ref 36.0–46.0)
Hemoglobin: 13.3 g/dL (ref 12.0–15.0)
Immature Granulocytes: 0 %
Lymphocytes Relative: 25 %
Lymphs Abs: 2.4 10*3/uL (ref 0.7–4.0)
MCH: 30.5 pg (ref 26.0–34.0)
MCHC: 33.2 g/dL (ref 30.0–36.0)
MCV: 92 fL (ref 80.0–100.0)
Monocytes Absolute: 0.7 10*3/uL (ref 0.1–1.0)
Monocytes Relative: 8 %
Neutro Abs: 6.2 10*3/uL (ref 1.7–7.7)
Neutrophils Relative %: 64 %
Platelets: 233 10*3/uL (ref 150–400)
RBC: 4.36 MIL/uL (ref 3.87–5.11)
RDW: 12.6 % (ref 11.5–15.5)
WBC: 9.6 10*3/uL (ref 4.0–10.5)
nRBC: 0 % (ref 0.0–0.2)

## 2023-03-16 LAB — D-DIMER, QUANTITATIVE: D-Dimer, Quant: <0.27 ug{FEU}/mL (ref 0.00–0.50)

## 2023-03-16 LAB — COMPREHENSIVE METABOLIC PANEL
ALT: 12 U/L (ref 0–44)
AST: 22 U/L (ref 15–41)
Albumin: 3.4 g/dL — ABNORMAL LOW (ref 3.5–5.0)
Alkaline Phosphatase: 47 U/L (ref 38–126)
Anion gap: 12 (ref 5–15)
BUN: 18 mg/dL (ref 6–20)
CO2: 24 mmol/L (ref 22–32)
Calcium: 9 mg/dL (ref 8.9–10.3)
Chloride: 102 mmol/L (ref 98–111)
Creatinine, Ser: 0.81 mg/dL (ref 0.44–1.00)
GFR, Estimated: >60 mL/min (ref >60)
Glucose, Bld: 79 mg/dL (ref 70–99)
Potassium: 3.9 mmol/L (ref 3.5–5.1)
Sodium: 138 mmol/L (ref 135–145)
Total Bilirubin: 0.6 mg/dL (ref <1.2)
Total Protein: 6.2 g/dL — ABNORMAL LOW (ref 6.5–8.1)

## 2023-03-16 LAB — TROPONIN I (HIGH SENSITIVITY): Troponin I (High Sensitivity): 2 ng/L (ref <18)

## 2023-03-16 LAB — LIPASE, BLOOD: Lipase: 31 U/L (ref 11–51)

## 2023-03-16 LAB — BRAIN NATRIURETIC PEPTIDE: B Natriuretic Peptide: 35.5 pg/mL (ref 0.0–100.0)

## 2023-03-16 LAB — HCG, SERUM, QUALITATIVE: Preg, Serum: NEGATIVE

## 2023-03-16 MED ORDER — ALBUTEROL SULFATE HFA 108 (90 BASE) MCG/ACT IN AERS: 1.0000 | INHALATION_SPRAY | Freq: Four times a day (QID) | RESPIRATORY_TRACT | 0 refills | Status: DC | PRN

## 2023-03-16 MED ORDER — IPRATROPIUM-ALBUTEROL 0.5-2.5 (3) MG/3ML IN SOLN
3.0000 mL | Freq: Once | RESPIRATORY_TRACT | Status: AC
  Administered 2023-03-16: 3 mL via RESPIRATORY_TRACT
  Filled 2023-03-16: qty 3

## 2023-03-16 MED ORDER — PREDNISONE 50 MG PO TABS: 50.0000 mg | ORAL_TABLET | Freq: Every day | ORAL | 0 refills | Status: AC

## 2023-03-16 MED ORDER — PREDNISONE 20 MG PO TABS
60.0000 mg | ORAL_TABLET | Freq: Once | ORAL | Status: AC
  Administered 2023-03-16: 60 mg via ORAL
  Filled 2023-03-16: qty 3

## 2023-03-16 MED ORDER — BENZONATATE 100 MG PO CAPS: 100.0000 mg | ORAL_CAPSULE | Freq: Three times a day (TID) | ORAL | 0 refills | Status: DC

## 2023-03-16 MED ORDER — METHYLPREDNISOLONE SODIUM SUCC 125 MG IJ SOLR
125.0000 mg | Freq: Once | INTRAMUSCULAR | Status: DC
  Filled 2023-03-16: qty 2

## 2023-03-16 MED ORDER — ALBUTEROL SULFATE HFA 108 (90 BASE) MCG/ACT IN AERS
2.0000 | INHALATION_SPRAY | RESPIRATORY_TRACT | Status: DC | PRN
  Administered 2023-03-16: 2 via RESPIRATORY_TRACT
  Filled 2023-03-16: qty 6.7

## 2023-03-16 NOTE — ED Notes (Signed)
Patient transported to X-ray 

## 2023-03-16 NOTE — Discharge Instructions (Addendum)
Take the medications as prescribed  Follow up outpatient, return for any worsening symptoms

## 2023-03-16 NOTE — ED Notes (Signed)
Audible wheezing heard from PT

## 2023-03-16 NOTE — ED Provider Notes (Signed)
[place] EMERGENCY DEPARTMENT AT [facility] Provider Note   CSN: [id number] Arrival date & time: 03/16/23  2032    History  Chief Complaint  Patient presents with   Shortness of Breath    Brittany Mullins is a 42 y.o. female for evaluation of shortness of breath.  Started 1 week ago.  She has felt generally poor.  Cough productive of yellow sputum.  Some congestion and rhinorrhea.  She has also had some NBNB emesis.  No abdominal pain.  She denies any history of asthma, COPD.  She was given an inhaler in triage which helped however she still has persistent cough productive of white sputum as well as shortness of breath. No fever, chest pain, pain or swelling to lower legs, history of PE or DVT, history of IVDU.  HPI     Home Medications Prior to Admission medications   Medication Sig Start Date End Date Taking? Authorizing Provider  albuterol (VENTOLIN HFA) 108 (90 Base) MCG/ACT inhaler Inhale 1-2 puffs into the lungs every 6 (six) hours as needed for wheezing or shortness of breath. 03/16/23  Yes Joanna Borawski A, PA-C  benzonatate (TESSALON) 100 MG capsule Take 1 capsule (100 mg total) by mouth every 8 (eight) hours. 03/16/23  Yes Lashauna Arpin A, PA-C  predniSONE (DELTASONE) 50 MG tablet Take 1 tablet (50 mg total) by mouth daily for 4 days. 03/16/23 03/20/23 Yes Danie Hannig A, PA-C  acetaminophen (TYLENOL) 325 MG tablet Take 2 tablets (650 mg total) by mouth every 6 (six) hours as needed for mild pain (or Fever >/= 101). 08/21/21   Sheikh, Omair Latif, DO  levETIRAcetam (KEPPRA) 500 MG tablet Take 1 tablet (500 mg total) by mouth 2 (two) times daily. 03/06/22   Alicia Amel, MD  methadone (DOLOPHINE) 10 MG/ML solution Take 120 mg by mouth daily.    [provider]      Allergies    Bee venom, Toradol [ketorolac tromethamine], and Benadryl [diphenhydramine]    Review of Systems   Review of Systems  Constitutional:  Positive for  activity change and fatigue.  HENT:  Positive for congestion and rhinorrhea.   Respiratory:  Positive for cough, chest tightness and shortness of breath.   Cardiovascular: Negative.   Gastrointestinal:  Positive for nausea and vomiting. Negative for abdominal distention, abdominal pain, anal bleeding, blood in stool, constipation, diarrhea and rectal pain.  Genitourinary: Negative.   Musculoskeletal: Negative.   Neurological: Negative.   All other systems reviewed and are negative.  Physical Exam Updated Vital Signs BP (!) 179/122 (BP Location: Left Arm)   Pulse 91   Temp 98.3 F (36.8 C)   Resp 18   Ht 5\' 2"  (1.575 m)   Wt 63.5 kg   SpO2 98%   BMI 25.60 kg/m  Physical Exam Vitals and nursing note reviewed.  Constitutional:      General: She is not in acute distress.    Appearance: She is well-developed. She is not ill-appearing.  HENT:     Head: Atraumatic.  Eyes:     Pupils: Pupils are equal, round, and reactive to light.  Cardiovascular:     Rate and Rhythm: Normal rate.     Pulses: Normal pulses.     Heart sounds: Normal heart sounds.  Pulmonary:     Effort: Pulmonary effort is normal. No respiratory distress.     Breath sounds: Wheezing and rhonchi present.  Abdominal:     General: Bowel sounds are normal. There  is no distension.     Palpations: Abdomen is soft. There is no hepatomegaly, splenomegaly or mass.     Tenderness: There is no abdominal tenderness. There is no guarding or rebound.  Musculoskeletal:        General: Normal range of motion.     Cervical back: Normal range of motion.     Right lower leg: No tenderness. No edema.     Left lower leg: No tenderness. No edema.  Skin:    General: Skin is warm and dry.     Capillary Refill: Capillary refill takes less than 2 seconds.  Neurological:     General: No focal deficit present.     Mental Status: She is alert.  Psychiatric:        Mood and Affect: Mood normal.    ED Results / Procedures /  Treatments   Labs (all labs ordered are listed, but only abnormal results are displayed) Labs Reviewed  COMPREHENSIVE METABOLIC PANEL - Abnormal; Notable for the following components:      Result Value   Total Protein 6.2 (*)    Albumin 3.4 (*)    All other components within normal limits  RESP PANEL BY RT-PCR (RSV, FLU A&B, COVID)  RVPGX2  CBC WITH DIFFERENTIAL/PLATELET  BRAIN NATRIURETIC PEPTIDE  D-DIMER, QUANTITATIVE  HCG, SERUM, QUALITATIVE  LIPASE, BLOOD  TROPONIN I (HIGH SENSITIVITY)    EKG EKG Interpretation Date/Time:  Saturday March 16 2023 20:43:26 EST Ventricular Rate:  75 PR Interval:  184 QRS Duration:  82 QT Interval:  428 QTC Calculation: 477 R Axis:   96  Text Interpretation: Normal sinus rhythm Right atrial enlargement Rightward axis Pulmonary disease pattern Abnormal ECG When compared with ECG of 01-Oct-2022 20:35, PREVIOUS ECG IS PRESENT Confirmed by Kristine Royal [phone]) on 03/16/2023 9:05:06 PM  Radiology DG Chest 2 View Result Date: 03/16/2023 CLINICAL DATA:  Cough and shortness of breath EXAM: CHEST - 2 VIEW COMPARISON:  08/30/2022 FINDINGS: Stable cardiomediastinal silhouette. No focal consolidation, pleural effusion, or pneumothorax. No displaced rib fractures. IMPRESSION: No active cardiopulmonary disease. Electronically Signed   By: Minerva Fester M.D.   On: 03/16/2023 21:35    Procedures Procedures    Medications Ordered in ED Medications  albuterol (VENTOLIN HFA) 108 (90 Base) MCG/ACT inhaler 2 puff (2 puffs Inhalation Given 03/16/23 2047)  ipratropium-albuterol (DUONEB) 0.5-2.5 (3) MG/3ML nebulizer solution 3 mL (3 mLs Nebulization Given 03/16/23 2211)  predniSONE (DELTASONE) tablet 60 mg (60 mg Oral Given 03/16/23 2305)   ED Course/ Medical Decision Making/ A&P   42 year old here for evaluation of cough and shortness of breath over the last week.  She is afebrile, nonseptic appearing.  She has some coarse lung sounds expiratory  wheeze and rhonchi.  Small amounts of emesis.  White sputum.  No clinical evidence of VTE on exam.  She denies any overt pain.  No recent sick contacts.  Plan on labs, imaging and reassess.  Labs and imaging personally viewed and interpreted:  CBC without leukocytosis CMP without significant abnormality Preg neg Lipase 31 Ddimer <0.27 Trop 2 BNP 35 Chest xray without cardiomegaly, pulm edema, pneumothorax EKG without ischemic changes  Reassessed.  Feels improved.  Resting, no acute respiratory distress.  Pending remaining workup.  Per nursing patient declined IV Solu-Medrol, prefers oral medication.  Will give 60 prednisone  Patient reassessed.  Again lungs clear on reassessment x 2.  Suspect viral URI.  Will have her start prednisone at home, albuterol.  Do not feel she  needs antibiotics at this time.  Tolerating p.o. take here. Benign abd exam on repeat eval.  At this time low suspicion for acute ACS, PE, dissection, hypertensive emergency or urgency, sepsis, myocarditis, pericarditis, effusion.  The patient has been appropriately medically screened and/or stabilized in the ED. I have low suspicion for any other emergent medical condition which would require further screening, evaluation or treatment in the ED or require inpatient management.  Patient is hemodynamically stable and in no acute distress.  Patient able to ambulate in department prior to ED.  Evaluation does not show acute pathology that would require ongoing or additional emergent interventions while in the emergency department or further inpatient treatment.  I have discussed the diagnosis with the patient and answered all questions.  Pain is been managed while in the emergency department and patient has no further complaints prior to discharge.  Patient is comfortable with plan discussed in room and is stable for discharge at this time.  I have discussed strict return precautions for returning to the emergency department.   Patient was encouraged to follow-up with PCP/specialist refer to at discharge.                                Medical Decision Making Amount and/or Complexity of Data Reviewed Independent Historian: spouse External Data Reviewed: labs, radiology, ECG and notes. Labs: ordered. Decision-making details documented in ED Course. Radiology: ordered and independent interpretation performed. Decision-making details documented in ED Course. ECG/medicine tests: ordered and independent interpretation performed. Decision-making details documented in ED Course.  Risk OTC drugs. Prescription drug management. Parenteral controlled substances. Decision regarding hospitalization. Diagnosis or treatment significantly limited by social determinants of health.          Final Clinical Impression(s) / ED Diagnoses Final diagnoses:  Wheezing  Hypertension, unspecified type    Rx / DC Orders ED Discharge Orders          Ordered    predniSONE (DELTASONE) 50 MG tablet  Daily        03/16/23 2310    albuterol (VENTOLIN HFA) 108 (90 Base) MCG/ACT inhaler  Every 6 hours PRN        03/16/23 2310    benzonatate (TESSALON) 100 MG capsule  Every 8 hours        03/16/23 2310              Mclane Arora A, PA-C 03/16/23 2317    Wynetta Fines, MD 03/16/23 2333

## 2023-03-16 NOTE — ED Triage Notes (Signed)
The pt has had shortness of breath for 1 and one half weeks  she does not use an inhaler she has noisy breathing sats 98%  coughing in triage   lmp none

## 2023-04-20 ENCOUNTER — Other Ambulatory Visit: Payer: Self-pay

## 2023-04-20 ENCOUNTER — Encounter (HOSPITAL_BASED_OUTPATIENT_CLINIC_OR_DEPARTMENT_OTHER): Payer: Self-pay

## 2023-04-20 ENCOUNTER — Emergency Department (HOSPITAL_BASED_OUTPATIENT_CLINIC_OR_DEPARTMENT_OTHER)
Admission: EM | Admit: 2023-04-20 | Discharge: 2023-04-20 | Disposition: A | Discharge status: Home / Self Care | Payer: Medicare Other | Attending: Emergency Medicine | Admitting: Emergency Medicine

## 2023-04-20 DIAGNOSIS — R42 Dizziness and giddiness: Secondary | ICD-10-CM | POA: Insufficient documentation

## 2023-04-20 DIAGNOSIS — I6329 Cerebral infarction due to unspecified occlusion or stenosis of other precerebral arteries: Secondary | ICD-10-CM | POA: Diagnosis not present

## 2023-04-20 DIAGNOSIS — R112 Nausea with vomiting, unspecified: Secondary | ICD-10-CM | POA: Insufficient documentation

## 2023-04-20 DIAGNOSIS — I639 Cerebral infarction, unspecified: Secondary | ICD-10-CM | POA: Diagnosis not present

## 2023-04-20 LAB — COMPREHENSIVE METABOLIC PANEL
ALT: 14 U/L (ref 0–44)
AST: 23 U/L (ref 15–41)
Albumin: 4.3 g/dL (ref 3.5–5.0)
Alkaline Phosphatase: 48 U/L (ref 38–126)
Anion gap: 7 (ref 5–15)
BUN: 17 mg/dL (ref 6–20)
CO2: 28 mmol/L (ref 22–32)
Calcium: 8.7 mg/dL — ABNORMAL LOW (ref 8.9–10.3)
Chloride: 102 mmol/L (ref 98–111)
Creatinine, Ser: 0.97 mg/dL (ref 0.44–1.00)
GFR, Estimated: >60 mL/min (ref >60)
Glucose, Bld: 71 mg/dL (ref 70–99)
Potassium: 3.7 mmol/L (ref 3.5–5.1)
Sodium: 137 mmol/L (ref 135–145)
Total Bilirubin: 0.3 mg/dL (ref 0.0–1.2)
Total Protein: 7.3 g/dL (ref 6.5–8.1)

## 2023-04-20 LAB — URINALYSIS, ROUTINE W REFLEX MICROSCOPIC
Bilirubin Urine: NEGATIVE
Glucose, UA: NEGATIVE mg/dL
Hgb urine dipstick: NEGATIVE
Ketones, ur: NEGATIVE mg/dL
Leukocytes,Ua: NEGATIVE
Nitrite: NEGATIVE
Protein, ur: NEGATIVE mg/dL
Specific Gravity, Urine: 1.013 (ref 1.005–1.030)
pH: 6.5 (ref 5.0–8.0)

## 2023-04-20 LAB — CBC
HCT: 39.6 % (ref 36.0–46.0)
Hemoglobin: 13.6 g/dL (ref 12.0–15.0)
MCH: 30.8 pg (ref 26.0–34.0)
MCHC: 34.3 g/dL (ref 30.0–36.0)
MCV: 89.8 fL (ref 80.0–100.0)
Platelets: 245 10*3/uL (ref 150–400)
RBC: 4.41 MIL/uL (ref 3.87–5.11)
RDW: 12.2 % (ref 11.5–15.5)
WBC: 5.9 10*3/uL (ref 4.0–10.5)
nRBC: 0 % (ref 0.0–0.2)

## 2023-04-20 LAB — LIPASE, BLOOD: Lipase: 11 U/L (ref 11–51)

## 2023-04-20 MED ORDER — METHYLPREDNISOLONE SODIUM SUCC 125 MG IJ SOLR
125.0000 mg | Freq: Once | INTRAMUSCULAR | Status: AC
  Administered 2023-04-20: 125 mg via INTRAVENOUS
  Filled 2023-04-20: qty 2

## 2023-04-20 MED ORDER — ONDANSETRON 8 MG PO TBDP: 8.0000 mg | ORAL_TABLET | Freq: Three times a day (TID) | ORAL | 0 refills | Status: DC | PRN

## 2023-04-20 MED ORDER — ONDANSETRON HCL 4 MG/2ML IJ SOLN
4.0000 mg | Freq: Once | INTRAMUSCULAR | Status: AC | PRN
  Administered 2023-04-20: 4 mg via INTRAVENOUS
  Filled 2023-04-20: qty 2

## 2023-04-20 MED ORDER — LACTATED RINGERS IV BOLUS
1000.0000 mL | Freq: Once | INTRAVENOUS | Status: AC
  Administered 2023-04-20: 1000 mL via INTRAVENOUS

## 2023-04-20 MED ORDER — DROPERIDOL 2.5 MG/ML IJ SOLN
2.5000 mg | Freq: Once | INTRAMUSCULAR | Status: AC
  Administered 2023-04-20: 2.5 mg via INTRAVENOUS
  Filled 2023-04-20: qty 2

## 2023-04-20 NOTE — ED Triage Notes (Signed)
Patient arrives with complaints of worsening vomiting and dizziness x1 week. Patient reports having some flu-like symptoms and now having "jittery feeling". 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

## 2023-04-20 NOTE — ED Notes (Signed)
Pt still unable to provide a urine sample 

## 2023-04-20 NOTE — ED Provider Notes (Signed)
[place] EMERGENCY DEPARTMENT AT [facility] Provider Note   CSN: [id number] Arrival date & time: 04/20/23  1017     History  Chief Complaint  Patient presents with   Vomiting   Dizziness    Brittany Mullins is a 43 y.o. female.  HPI 43 yo presents with reports she has had vomiting and dizziness.  She reports that she has a history of Addison's.  There are varying reports of this and record is not clear.  She has had CT in the past that shows empty sella syndrome.  She reports that she is scheduled to see a neurosurgeon.  She denies recent head injury, headache, fever, chills, chest pain.  She reports some nausea with dizziness over the last week.  She states that she has been "not taking care of herself" and not taking her medications.  Review of record shows albuterol, Tessalon, Keppra, and methadone.  Medical history significant for opioid dependence, marijuana use, cyclical vomiting with nausea, history of Addison's listed in problem list, AKI, intractable nausea and vomiting. She reports prior history of pancreatitis and has had stent placed.  She is not currently having significant abdominal pain.    Home Medications Prior to Admission medications   Medication Sig Start Date End Date Taking? Authorizing Provider  ondansetron (ZOFRAN-ODT) 8 MG disintegrating tablet Take 1 tablet (8 mg total) by mouth every 8 (eight) hours as needed for nausea or vomiting. 04/20/23  Yes Margarita Grizzle, MD  acetaminophen (TYLENOL) 325 MG tablet Take 2 tablets (650 mg total) by mouth every 6 (six) hours as needed for mild pain (or Fever >/= 101). 08/21/21   Sheikh, Omair Latif, DO  albuterol (VENTOLIN HFA) 108 (90 Base) MCG/ACT inhaler Inhale 1-2 puffs into the lungs every 6 (six) hours as needed for wheezing or shortness of breath. 03/16/23   Henderly, Britni A, PA-C  benzonatate (TESSALON) 100 MG capsule Take 1 capsule (100 mg total) by mouth every 8 (eight) hours. 03/16/23    Henderly, Britni A, PA-C  levETIRAcetam (KEPPRA) 500 MG tablet Take 1 tablet (500 mg total) by mouth 2 (two) times daily. 03/06/22   Alicia Amel, MD  methadone (DOLOPHINE) 10 MG/ML solution Take 120 mg by mouth daily.    [provider]      Allergies    Bee venom, Toradol [ketorolac tromethamine], and Benadryl [diphenhydramine]    Review of Systems   Review of Systems  Physical Exam Updated Vital Signs BP (!) 165/106   Pulse 89   Temp 98.8 F (37.1 C) (Oral)   Resp 15   Ht 1.575 m (5\' 2" )   Wt 64 kg   SpO2 99%   BMI 25.81 kg/m  Physical Exam Vitals and nursing note reviewed.  Constitutional:      Appearance: She is normal weight.  HENT:     Head: Normocephalic and atraumatic.     Right Ear: External ear normal.     Left Ear: External ear normal.     Nose: Nose normal.     Mouth/Throat:     Pharynx: Oropharynx is clear.  Eyes:     Pupils: Pupils are equal, round, and reactive to light.  Cardiovascular:     Rate and Rhythm: Normal rate and regular rhythm.     Pulses: Normal pulses.     Heart sounds: Normal heart sounds.  Pulmonary:     Effort: Pulmonary effort is normal.     Breath sounds: Normal breath sounds.  Abdominal:  General: Abdomen is flat. Bowel sounds are normal.     Palpations: Abdomen is soft.  Musculoskeletal:        General: Normal range of motion.     Cervical back: Normal range of motion.  Skin:    General: Skin is warm.     Capillary Refill: Capillary refill takes less than 2 seconds.  Neurological:     General: No focal deficit present.     Mental Status: She is alert and oriented to person, place, and time.     Cranial Nerves: No cranial nerve deficit.     Sensory: No sensory deficit.     Motor: No weakness.     Coordination: Coordination normal.     Gait: Gait normal.     Deep Tendon Reflexes: Reflexes normal.  Psychiatric:        Mood and Affect: Mood normal.     ED Results / Procedures / Treatments   Labs (all  labs ordered are listed, but only abnormal results are displayed) Labs Reviewed  COMPREHENSIVE METABOLIC PANEL - Abnormal; Notable for the following components:      Result Value   Calcium 8.7 (*)    All other components within normal limits  LIPASE, BLOOD  CBC  URINALYSIS, ROUTINE W REFLEX MICROSCOPIC    EKG None  Radiology No results found.  Procedures Procedures    Medications Ordered in ED Medications  ondansetron (ZOFRAN) injection 4 mg (4 mg Intravenous Given 04/20/23 1151)  lactated ringers bolus 1,000 mL (1,000 mLs Intravenous New Bag/Given 04/20/23 1220)  methylPREDNISolone sodium succinate (SOLU-MEDROL) 125 mg/2 mL injection 125 mg (125 mg Intravenous Given 04/20/23 1223)  droperidol (INAPSINE) 2.5 MG/ML injection 2.5 mg (2.5 mg Intravenous Given 04/20/23 1224)    ED Course/ Medical Decision Making/ A&P Clinical Course as of 04/20/23 1418  Sat Apr 20, 2023  1415 CBC reviewed interpreted and within normal limits [DR]  1416 Urinalysis reviewed interpreted and normal Lipase normal [DR]    Clinical Course User Index [DR] Margarita Grizzle, MD                                 Medical Decision Making Amount and/or Complexity of Data Reviewed Labs: ordered.  Risk Prescription drug management.   43 year old female history of Addison's empty sella syndrome presents today with nausea and vomiting.  She is treated here with a liter of IV fluid, Solu-Medrol, droperidol.  She has been taking p.o. without active vomiting.  Labs are normal.  No hypotension here.  Blood pressures have ranged from 130s to 160.  Heart rate was initially 103 and has normalized 80 patient will be discharged with Zofran.  She has to follow-up as previously scheduled outpatient.        Final Clinical Impression(s) / ED Diagnoses Final diagnoses:  Nausea and vomiting, unspecified vomiting type    Rx / DC Orders ED Discharge Orders          Ordered    ondansetron (ZOFRAN-ODT) 8 MG  disintegrating tablet  Every 8 hours PRN        04/20/23 1417              Margarita Grizzle, MD 04/20/23 1418

## 2023-04-20 NOTE — ED Notes (Signed)
Dc instructions reviewed with patient. Patient voiced understanding. Dc with belongings.  °

## 2023-04-22 ENCOUNTER — Emergency Department (HOSPITAL_COMMUNITY): Payer: Medicare Other

## 2023-04-22 ENCOUNTER — Inpatient Hospital Stay (HOSPITAL_COMMUNITY): Payer: Medicare Other

## 2023-04-22 ENCOUNTER — Emergency Department (HOSPITAL_BASED_OUTPATIENT_CLINIC_OR_DEPARTMENT_OTHER): Payer: Medicare Other

## 2023-04-22 ENCOUNTER — Inpatient Hospital Stay (HOSPITAL_BASED_OUTPATIENT_CLINIC_OR_DEPARTMENT_OTHER)
Admission: EM | Admit: 2023-04-22 | Discharge: 2023-05-01 | DRG: 065 | Disposition: A | Discharge status: Rehabilitation Facility | Payer: Medicare Other | Attending: Internal Medicine | Admitting: Internal Medicine

## 2023-04-22 ENCOUNTER — Other Ambulatory Visit: Payer: Self-pay

## 2023-04-22 ENCOUNTER — Encounter (HOSPITAL_BASED_OUTPATIENT_CLINIC_OR_DEPARTMENT_OTHER): Payer: Self-pay | Admitting: Emergency Medicine

## 2023-04-22 DIAGNOSIS — K5900 Constipation, unspecified: Secondary | ICD-10-CM | POA: Diagnosis not present

## 2023-04-22 DIAGNOSIS — N179 Acute kidney failure, unspecified: Secondary | ICD-10-CM

## 2023-04-22 DIAGNOSIS — I69351 Hemiplegia and hemiparesis following cerebral infarction affecting right dominant side: Secondary | ICD-10-CM | POA: Diagnosis not present

## 2023-04-22 DIAGNOSIS — Z7902 Long term (current) use of antithrombotics/antiplatelets: Secondary | ICD-10-CM | POA: Diagnosis not present

## 2023-04-22 DIAGNOSIS — F1290 Cannabis use, unspecified, uncomplicated: Secondary | ICD-10-CM | POA: Diagnosis present

## 2023-04-22 DIAGNOSIS — R29706 NIHSS score 6: Secondary | ICD-10-CM | POA: Diagnosis present

## 2023-04-22 DIAGNOSIS — I10 Essential (primary) hypertension: Secondary | ICD-10-CM | POA: Diagnosis present

## 2023-04-22 DIAGNOSIS — Z8249 Family history of ischemic heart disease and other diseases of the circulatory system: Secondary | ICD-10-CM

## 2023-04-22 DIAGNOSIS — E876 Hypokalemia: Secondary | ICD-10-CM | POA: Diagnosis present

## 2023-04-22 DIAGNOSIS — F319 Bipolar disorder, unspecified: Secondary | ICD-10-CM | POA: Diagnosis present

## 2023-04-22 DIAGNOSIS — Z91030 Bee allergy status: Secondary | ICD-10-CM

## 2023-04-22 DIAGNOSIS — F445 Conversion disorder with seizures or convulsions: Secondary | ICD-10-CM | POA: Diagnosis not present

## 2023-04-22 DIAGNOSIS — I6329 Cerebral infarction due to unspecified occlusion or stenosis of other precerebral arteries: Principal | ICD-10-CM | POA: Diagnosis present

## 2023-04-22 DIAGNOSIS — K2990 Gastroduodenitis, unspecified, without bleeding: Secondary | ICD-10-CM

## 2023-04-22 DIAGNOSIS — I6300 Cerebral infarction due to thrombosis of unspecified precerebral artery: Secondary | ICD-10-CM | POA: Diagnosis not present

## 2023-04-22 DIAGNOSIS — M79661 Pain in right lower leg: Secondary | ICD-10-CM | POA: Diagnosis not present

## 2023-04-22 DIAGNOSIS — K219 Gastro-esophageal reflux disease without esophagitis: Secondary | ICD-10-CM | POA: Diagnosis present

## 2023-04-22 DIAGNOSIS — E271 Primary adrenocortical insufficiency: Secondary | ICD-10-CM | POA: Diagnosis present

## 2023-04-22 DIAGNOSIS — G40909 Epilepsy, unspecified, not intractable, without status epilepticus: Secondary | ICD-10-CM | POA: Diagnosis not present

## 2023-04-22 DIAGNOSIS — R220 Localized swelling, mass and lump, head: Secondary | ICD-10-CM | POA: Diagnosis present

## 2023-04-22 DIAGNOSIS — I6389 Other cerebral infarction: Secondary | ICD-10-CM | POA: Diagnosis not present

## 2023-04-22 DIAGNOSIS — R519 Headache, unspecified: Secondary | ICD-10-CM

## 2023-04-22 DIAGNOSIS — F1120 Opioid dependence, uncomplicated: Secondary | ICD-10-CM | POA: Diagnosis present

## 2023-04-22 DIAGNOSIS — E875 Hyperkalemia: Secondary | ICD-10-CM | POA: Diagnosis present

## 2023-04-22 DIAGNOSIS — I639 Cerebral infarction, unspecified: Secondary | ICD-10-CM | POA: Diagnosis present

## 2023-04-22 DIAGNOSIS — R112 Nausea with vomiting, unspecified: Secondary | ICD-10-CM | POA: Diagnosis present

## 2023-04-22 DIAGNOSIS — Z8711 Personal history of peptic ulcer disease: Secondary | ICD-10-CM

## 2023-04-22 DIAGNOSIS — R531 Weakness: Secondary | ICD-10-CM | POA: Diagnosis not present

## 2023-04-22 DIAGNOSIS — K861 Other chronic pancreatitis: Secondary | ICD-10-CM | POA: Diagnosis present

## 2023-04-22 DIAGNOSIS — R4701 Aphasia: Secondary | ICD-10-CM | POA: Diagnosis present

## 2023-04-22 DIAGNOSIS — I69398 Other sequelae of cerebral infarction: Secondary | ICD-10-CM | POA: Diagnosis not present

## 2023-04-22 DIAGNOSIS — I6302 Cerebral infarction due to thrombosis of basilar artery: Secondary | ICD-10-CM | POA: Diagnosis not present

## 2023-04-22 DIAGNOSIS — Z833 Family history of diabetes mellitus: Secondary | ICD-10-CM | POA: Diagnosis not present

## 2023-04-22 DIAGNOSIS — M25512 Pain in left shoulder: Secondary | ICD-10-CM | POA: Diagnosis not present

## 2023-04-22 DIAGNOSIS — K2970 Gastritis, unspecified, without bleeding: Secondary | ICD-10-CM | POA: Diagnosis present

## 2023-04-22 DIAGNOSIS — Z800 Family history of malignant neoplasm of digestive organs: Secondary | ICD-10-CM

## 2023-04-22 DIAGNOSIS — R569 Unspecified convulsions: Secondary | ICD-10-CM | POA: Diagnosis present

## 2023-04-22 DIAGNOSIS — H539 Unspecified visual disturbance: Secondary | ICD-10-CM | POA: Diagnosis not present

## 2023-04-22 DIAGNOSIS — E860 Dehydration: Secondary | ICD-10-CM | POA: Diagnosis present

## 2023-04-22 DIAGNOSIS — G894 Chronic pain syndrome: Secondary | ICD-10-CM | POA: Diagnosis not present

## 2023-04-22 DIAGNOSIS — E785 Hyperlipidemia, unspecified: Secondary | ICD-10-CM | POA: Diagnosis present

## 2023-04-22 DIAGNOSIS — Z90710 Acquired absence of both cervix and uterus: Secondary | ICD-10-CM

## 2023-04-22 DIAGNOSIS — R42 Dizziness and giddiness: Secondary | ICD-10-CM

## 2023-04-22 DIAGNOSIS — Z86011 Personal history of benign neoplasm of the brain: Secondary | ICD-10-CM | POA: Diagnosis not present

## 2023-04-22 DIAGNOSIS — R300 Dysuria: Secondary | ICD-10-CM | POA: Diagnosis not present

## 2023-04-22 DIAGNOSIS — K859 Acute pancreatitis without necrosis or infection, unspecified: Secondary | ICD-10-CM | POA: Diagnosis not present

## 2023-04-22 DIAGNOSIS — M79602 Pain in left arm: Secondary | ICD-10-CM | POA: Diagnosis not present

## 2023-04-22 DIAGNOSIS — M5412 Radiculopathy, cervical region: Secondary | ICD-10-CM | POA: Diagnosis not present

## 2023-04-22 DIAGNOSIS — R29810 Facial weakness: Secondary | ICD-10-CM | POA: Diagnosis present

## 2023-04-22 DIAGNOSIS — E282 Polycystic ovarian syndrome: Secondary | ICD-10-CM | POA: Diagnosis present

## 2023-04-22 DIAGNOSIS — Z9049 Acquired absence of other specified parts of digestive tract: Secondary | ICD-10-CM

## 2023-04-22 DIAGNOSIS — Z8659 Personal history of other mental and behavioral disorders: Secondary | ICD-10-CM | POA: Diagnosis not present

## 2023-04-22 DIAGNOSIS — R278 Other lack of coordination: Secondary | ICD-10-CM | POA: Diagnosis present

## 2023-04-22 DIAGNOSIS — Z79899 Other long term (current) drug therapy: Secondary | ICD-10-CM | POA: Diagnosis not present

## 2023-04-22 DIAGNOSIS — M4802 Spinal stenosis, cervical region: Secondary | ICD-10-CM | POA: Diagnosis not present

## 2023-04-22 DIAGNOSIS — Z8639 Personal history of other endocrine, nutritional and metabolic disease: Secondary | ICD-10-CM | POA: Diagnosis not present

## 2023-04-22 DIAGNOSIS — G8929 Other chronic pain: Secondary | ICD-10-CM | POA: Diagnosis not present

## 2023-04-22 DIAGNOSIS — F3177 Bipolar disorder, in partial remission, most recent episode mixed: Secondary | ICD-10-CM | POA: Diagnosis not present

## 2023-04-22 DIAGNOSIS — R000 Tachycardia, unspecified: Secondary | ICD-10-CM | POA: Diagnosis present

## 2023-04-22 DIAGNOSIS — Z79890 Hormone replacement therapy (postmenopausal): Secondary | ICD-10-CM | POA: Diagnosis not present

## 2023-04-22 DIAGNOSIS — F1994 Other psychoactive substance use, unspecified with psychoactive substance-induced mood disorder: Secondary | ICD-10-CM | POA: Diagnosis not present

## 2023-04-22 DIAGNOSIS — Z888 Allergy status to other drugs, medicaments and biological substances status: Secondary | ICD-10-CM

## 2023-04-22 DIAGNOSIS — G8191 Hemiplegia, unspecified affecting right dominant side: Secondary | ICD-10-CM | POA: Diagnosis present

## 2023-04-22 DIAGNOSIS — F1129 Opioid dependence with unspecified opioid-induced disorder: Secondary | ICD-10-CM | POA: Diagnosis not present

## 2023-04-22 DIAGNOSIS — G5621 Lesion of ulnar nerve, right upper limb: Secondary | ICD-10-CM | POA: Diagnosis not present

## 2023-04-22 DIAGNOSIS — D497 Neoplasm of unspecified behavior of endocrine glands and other parts of nervous system: Secondary | ICD-10-CM | POA: Diagnosis present

## 2023-04-22 DIAGNOSIS — R471 Dysarthria and anarthria: Secondary | ICD-10-CM | POA: Diagnosis present

## 2023-04-22 HISTORY — DX: Other chronic pancreatitis: K86.1

## 2023-04-22 HISTORY — DX: Unspecified hearing loss, unspecified ear: H91.90

## 2023-04-22 LAB — RAPID URINE DRUG SCREEN, HOSP PERFORMED
Amphetamines: NOT DETECTED
Barbiturates: NOT DETECTED
Benzodiazepines: NOT DETECTED
Cocaine: NOT DETECTED
Opiates: NOT DETECTED
Tetrahydrocannabinol: POSITIVE — AB

## 2023-04-22 LAB — URINALYSIS, ROUTINE W REFLEX MICROSCOPIC
Bilirubin Urine: NEGATIVE
Glucose, UA: NEGATIVE mg/dL
Hgb urine dipstick: NEGATIVE
Ketones, ur: NEGATIVE mg/dL
Leukocytes,Ua: NEGATIVE
Nitrite: NEGATIVE
Protein, ur: NEGATIVE mg/dL
Specific Gravity, Urine: >1.046 — ABNORMAL HIGH (ref 1.005–1.030)
pH: 6.5 (ref 5.0–8.0)

## 2023-04-22 LAB — BASIC METABOLIC PANEL
Anion gap: 12 (ref 5–15)
BUN: 10 mg/dL (ref 6–20)
CO2: 24 mmol/L (ref 22–32)
Calcium: 8.2 mg/dL — ABNORMAL LOW (ref 8.9–10.3)
Chloride: 100 mmol/L (ref 98–111)
Creatinine, Ser: 0.92 mg/dL (ref 0.44–1.00)
GFR, Estimated: >60 mL/min (ref >60)
Glucose, Bld: 117 mg/dL — ABNORMAL HIGH (ref 70–99)
Potassium: 3.2 mmol/L — ABNORMAL LOW (ref 3.5–5.1)
Sodium: 136 mmol/L (ref 135–145)

## 2023-04-22 LAB — CBC WITH DIFFERENTIAL/PLATELET
Abs Immature Granulocytes: 0.02 10*3/uL (ref 0.00–0.07)
Basophils Absolute: 0 10*3/uL (ref 0.0–0.1)
Basophils Relative: 1 %
Eosinophils Absolute: 0.1 10*3/uL (ref 0.0–0.5)
Eosinophils Relative: 1 %
HCT: 42 % (ref 36.0–46.0)
Hemoglobin: 14.5 g/dL (ref 12.0–15.0)
Immature Granulocytes: 0 %
Lymphocytes Relative: 31 %
Lymphs Abs: 2.3 10*3/uL (ref 0.7–4.0)
MCH: 31 pg (ref 26.0–34.0)
MCHC: 34.5 g/dL (ref 30.0–36.0)
MCV: 89.7 fL (ref 80.0–100.0)
Monocytes Absolute: 0.6 10*3/uL (ref 0.1–1.0)
Monocytes Relative: 8 %
Neutro Abs: 4.4 10*3/uL (ref 1.7–7.7)
Neutrophils Relative %: 59 %
Platelets: 257 10*3/uL (ref 150–400)
RBC: 4.68 MIL/uL (ref 3.87–5.11)
RDW: 12.2 % (ref 11.5–15.5)
WBC: 7.4 10*3/uL (ref 4.0–10.5)
nRBC: 0 % (ref 0.0–0.2)

## 2023-04-22 LAB — LIPID PANEL
Cholesterol: 162 mg/dL (ref 0–200)
HDL: 46 mg/dL (ref >40)
LDL Cholesterol: 97 mg/dL (ref 0–99)
Total CHOL/HDL Ratio: 3.5 {ratio}
Triglycerides: 96 mg/dL (ref <150)
VLDL: 19 mg/dL (ref 0–40)

## 2023-04-22 LAB — COMPREHENSIVE METABOLIC PANEL
ALT: 13 U/L (ref 0–44)
AST: 29 U/L (ref 15–41)
Albumin: 4.2 g/dL (ref 3.5–5.0)
Alkaline Phosphatase: 44 U/L (ref 38–126)
Anion gap: 7 (ref 5–15)
BUN: 17 mg/dL (ref 6–20)
CO2: 27 mmol/L (ref 22–32)
Calcium: 8.8 mg/dL — ABNORMAL LOW (ref 8.9–10.3)
Chloride: 101 mmol/L (ref 98–111)
Creatinine, Ser: 1 mg/dL (ref 0.44–1.00)
GFR, Estimated: >60 mL/min (ref >60)
Glucose, Bld: 84 mg/dL (ref 70–99)
Potassium: 5.2 mmol/L — ABNORMAL HIGH (ref 3.5–5.1)
Sodium: 135 mmol/L (ref 135–145)
Total Bilirubin: 0.4 mg/dL (ref 0.0–1.2)
Total Protein: 7.1 g/dL (ref 6.5–8.1)

## 2023-04-22 LAB — ETHANOL: Alcohol, Ethyl (B): <10 mg/dL (ref <10)

## 2023-04-22 LAB — CBG MONITORING, ED: Glucose-Capillary: 84 mg/dL (ref 70–99)

## 2023-04-22 LAB — MAGNESIUM: Magnesium: 2 mg/dL (ref 1.7–2.4)

## 2023-04-22 MED ORDER — LEVONORGESTREL-ETHINYL ESTRAD 0.1-20 MG-MCG PO TABS
1.0000 | ORAL_TABLET | Freq: Every day | ORAL | Status: DC
  Administered 2023-04-24 – 2023-05-01 (×8): 1 via ORAL
  Filled 2023-04-22 (×2): qty 1

## 2023-04-22 MED ORDER — MAGNESIUM CITRATE PO SOLN
1.0000 | Freq: Once | ORAL | Status: AC | PRN
  Administered 2023-04-26: 1 via ORAL
  Filled 2023-04-22: qty 296

## 2023-04-22 MED ORDER — ATORVASTATIN CALCIUM 40 MG PO TABS
40.0000 mg | ORAL_TABLET | Freq: Every day | ORAL | Status: DC
Start: 2023-04-22 — End: 2023-05-01
  Administered 2023-04-22 – 2023-05-01 (×10): 40 mg via ORAL
  Filled 2023-04-22 (×10): qty 1

## 2023-04-22 MED ORDER — CLOPIDOGREL BISULFATE 300 MG PO TABS
300.0000 mg | ORAL_TABLET | Freq: Once | ORAL | Status: AC
  Administered 2023-04-22: 300 mg via ORAL
  Filled 2023-04-22: qty 1

## 2023-04-22 MED ORDER — LEVETIRACETAM IN NACL 500 MG/100ML IV SOLN
500.0000 mg | Freq: Three times a day (TID) | INTRAVENOUS | Status: DC
  Administered 2023-04-22 – 2023-04-23 (×2): 500 mg via INTRAVENOUS
  Filled 2023-04-22 (×2): qty 100

## 2023-04-22 MED ORDER — MIDAZOLAM HCL 2 MG/2ML IJ SOLN: 4.0000 mg | Freq: Once | INTRAMUSCULAR | Status: AC

## 2023-04-22 MED ORDER — ONDANSETRON HCL 4 MG PO TABS
4.0000 mg | ORAL_TABLET | Freq: Four times a day (QID) | ORAL | Status: DC | PRN
Start: 2023-04-22 — End: 2023-04-24
  Administered 2023-04-23: 4 mg via ORAL
  Filled 2023-04-22: qty 1

## 2023-04-22 MED ORDER — HYDROCORTISONE 5 MG PO TABS
5.0000 mg | ORAL_TABLET | Freq: Every day | ORAL | Status: DC
  Administered 2023-04-23 – 2023-05-01 (×9): 5 mg via ORAL
  Filled 2023-04-22 (×11): qty 1

## 2023-04-22 MED ORDER — LACTATED RINGERS IV BOLUS
1000.0000 mL | Freq: Once | INTRAVENOUS | Status: AC
Start: 2023-04-22 — End: 2023-04-22
  Administered 2023-04-22: 1000 mL via INTRAVENOUS

## 2023-04-22 MED ORDER — HYDROCORTISONE 10 MG PO TABS
10.0000 mg | ORAL_TABLET | Freq: Every day | ORAL | Status: DC
  Administered 2023-04-23 – 2023-05-01 (×8): 10 mg via ORAL
  Filled 2023-04-22 (×11): qty 1

## 2023-04-22 MED ORDER — MIDAZOLAM HCL 2 MG/2ML IJ SOLN
INTRAMUSCULAR | Status: AC
  Administered 2023-04-22: 4 mg via INTRAVENOUS
  Filled 2023-04-22: qty 4

## 2023-04-22 MED ORDER — ACETAMINOPHEN 500 MG PO TABS
1000.0000 mg | ORAL_TABLET | Freq: Once | ORAL | Status: AC
  Administered 2023-04-22: 1000 mg via ORAL
  Filled 2023-04-22: qty 2

## 2023-04-22 MED ORDER — ONDANSETRON HCL 4 MG/2ML IJ SOLN
4.0000 mg | Freq: Four times a day (QID) | INTRAMUSCULAR | Status: DC | PRN
Start: 2023-04-22 — End: 2023-04-24
  Administered 2023-04-23 – 2023-04-24 (×2): 4 mg via INTRAVENOUS
  Filled 2023-04-22 (×2): qty 2

## 2023-04-22 MED ORDER — ENOXAPARIN SODIUM 40 MG/0.4ML IJ SOSY
40.0000 mg | PREFILLED_SYRINGE | INTRAMUSCULAR | Status: DC
  Administered 2023-04-22 – 2023-04-30 (×9): 40 mg via SUBCUTANEOUS
  Filled 2023-04-22 (×9): qty 0.4

## 2023-04-22 MED ORDER — SENNA 8.6 MG PO TABS
1.0000 | ORAL_TABLET | Freq: Two times a day (BID) | ORAL | Status: DC
  Administered 2023-04-23 – 2023-05-01 (×16): 8.6 mg via ORAL
  Filled 2023-04-22 (×17): qty 1

## 2023-04-22 MED ORDER — METOCLOPRAMIDE HCL 5 MG/ML IJ SOLN
10.0000 mg | Freq: Once | INTRAMUSCULAR | Status: AC
  Administered 2023-04-22: 10 mg via INTRAVENOUS
  Filled 2023-04-22: qty 2

## 2023-04-22 MED ORDER — SODIUM CHLORIDE 0.9% FLUSH
3.0000 mL | Freq: Two times a day (BID) | INTRAVENOUS | Status: DC
  Administered 2023-04-22 – 2023-05-01 (×18): 3 mL via INTRAVENOUS

## 2023-04-22 MED ORDER — MORPHINE SULFATE (PF) 2 MG/ML IV SOLN
2.0000 mg | Freq: Once | INTRAVENOUS | Status: AC
Start: 2023-04-22 — End: 2023-04-22
  Administered 2023-04-22: 2 mg via INTRAVENOUS
  Filled 2023-04-22: qty 1

## 2023-04-22 MED ORDER — POLYETHYLENE GLYCOL 3350 17 G PO PACK
17.0000 g | PACK | Freq: Every day | ORAL | Status: DC | PRN
  Administered 2023-04-26 – 2023-05-01 (×2): 17 g via ORAL
  Filled 2023-04-22: qty 1

## 2023-04-22 MED ORDER — LACTATED RINGERS IV BOLUS
1000.0000 mL | Freq: Once | INTRAVENOUS | Status: AC
  Administered 2023-04-22: 1000 mL via INTRAVENOUS

## 2023-04-22 MED ORDER — ACETAMINOPHEN 650 MG RE SUPP: 650.0000 mg | Freq: Four times a day (QID) | RECTAL | Status: DC | PRN

## 2023-04-22 MED ORDER — GADOBUTROL 1 MMOL/ML IV SOLN
6.0000 mL | Freq: Once | INTRAVENOUS | Status: AC | PRN
  Administered 2023-04-22: 6 mL via INTRAVENOUS

## 2023-04-22 MED ORDER — ACETAMINOPHEN 325 MG PO TABS
650.0000 mg | ORAL_TABLET | Freq: Four times a day (QID) | ORAL | Status: DC | PRN
  Administered 2023-04-23 – 2023-04-27 (×7): 650 mg via ORAL
  Filled 2023-04-22 (×8): qty 2

## 2023-04-22 MED ORDER — SORBITOL 70 % SOLN
30.0000 mL | Freq: Every day | Status: DC | PRN
Start: 2023-04-22 — End: 2023-05-01

## 2023-04-22 MED ORDER — CLOPIDOGREL BISULFATE 75 MG PO TABS
75.0000 mg | ORAL_TABLET | Freq: Every day | ORAL | Status: DC
  Administered 2023-04-23 – 2023-05-01 (×9): 75 mg via ORAL
  Filled 2023-04-22 (×9): qty 1

## 2023-04-22 MED ORDER — SODIUM CHLORIDE 0.9 % IV SOLN: INTRAVENOUS | Status: AC

## 2023-04-22 MED ORDER — POTASSIUM CHLORIDE CRYS ER 20 MEQ PO TBCR
40.0000 meq | EXTENDED_RELEASE_TABLET | Freq: Once | ORAL | Status: AC
  Administered 2023-04-22: 40 meq via ORAL
  Filled 2023-04-22: qty 2

## 2023-04-22 MED ORDER — STROKE: EARLY STAGES OF RECOVERY BOOK
Freq: Once | Status: AC
Start: 2023-04-23 — End: 2023-04-23

## 2023-04-22 MED ORDER — PANTOPRAZOLE SODIUM 40 MG IV SOLR
40.0000 mg | INTRAVENOUS | Status: DC
  Administered 2023-04-22: 40 mg via INTRAVENOUS
  Filled 2023-04-22: qty 10

## 2023-04-22 MED ORDER — ALBUTEROL SULFATE (2.5 MG/3ML) 0.083% IN NEBU: 2.5000 mg | INHALATION_SOLUTION | RESPIRATORY_TRACT | Status: DC | PRN

## 2023-04-22 MED ORDER — LORAZEPAM 2 MG/ML IJ SOLN
1.0000 mg | Freq: Once | INTRAMUSCULAR | Status: AC
  Administered 2023-04-22: 1 mg via INTRAVENOUS
  Filled 2023-04-22: qty 1

## 2023-04-22 MED ORDER — METHADONE HCL 10 MG PO TABS
120.0000 mg | ORAL_TABLET | Freq: Every day | ORAL | Status: DC
  Administered 2023-04-23 – 2023-05-01 (×9): 120 mg via ORAL
  Filled 2023-04-22 (×6): qty 12
  Filled 2023-04-22: qty 24
  Filled 2023-04-22 (×2): qty 12

## 2023-04-22 MED ORDER — LEVETIRACETAM 500 MG PO TABS: 500.0000 mg | ORAL_TABLET | Freq: Three times a day (TID) | ORAL | Status: DC

## 2023-04-22 MED ORDER — MAGNESIUM SULFATE 2 GM/50ML IV SOLN
2.0000 g | Freq: Once | INTRAVENOUS | Status: AC
  Administered 2023-04-22: 2 g via INTRAVENOUS
  Filled 2023-04-22: qty 50

## 2023-04-22 MED ORDER — HYDRALAZINE HCL 20 MG/ML IJ SOLN: 10.0000 mg | Freq: Four times a day (QID) | INTRAMUSCULAR | Status: DC | PRN

## 2023-04-22 MED ORDER — ONDANSETRON HCL 4 MG/2ML IJ SOLN
4.0000 mg | Freq: Once | INTRAMUSCULAR | Status: AC
  Administered 2023-04-22: 4 mg via INTRAVENOUS
  Filled 2023-04-22: qty 2

## 2023-04-22 MED ORDER — IOHEXOL 350 MG/ML SOLN
100.0000 mL | Freq: Once | INTRAVENOUS | Status: AC | PRN
  Administered 2023-04-22: 80 mL via INTRAVENOUS

## 2023-04-22 MED ORDER — PROCHLORPERAZINE EDISYLATE 10 MG/2ML IJ SOLN
5.0000 mg | Freq: Once | INTRAMUSCULAR | Status: AC
Start: 2023-04-22 — End: 2023-04-22
  Administered 2023-04-22: 5 mg via INTRAVENOUS
  Filled 2023-04-22: qty 2

## 2023-04-22 NOTE — ED Notes (Signed)
MD notified and requested to come and see the patient because she has been rolling all around in bed yelling "something's not right" basically inconsolable

## 2023-04-22 NOTE — ED Provider Notes (Signed)
[place] EMERGENCY DEPARTMENT AT [facility] Provider Note   CSN: [id number] Arrival date & time: 04/22/23  4401     History  Chief Complaint  Patient presents with   Numbness    Brittany Mullins is a 43 y.o. female with PMH as listed below who presents with numbness. Patient w/ prior history of Addison's disease, pituitary tumor, empty sella syndrome, HTN, gastritis/duodenitis, bipolar disorder, opiate dependence on methadone, THC use, PCOS, seizures who presents w/ numbness.   Per chart review patient was seen on 04/20/2023 for nausea vomiting and dizziness, treated with fluid, Solu-Medrol, droperidol and improved her symptoms, discharged with Zofran.  Patient presents today with full body right-sided numbness including her face, arm, and leg that started yesterday at 12 pm. She states her LKN was 10 AM. No facial droop, confusion, or slurred speech. No falls/head trauma. No f/c, chest pain, abd pain, urinary symptoms. Does endorse a headache. Patient's son at bedside noted her gait was abnormal yesterday. She also states that she has continued to have nausea/vomiting and hasn't been able to tolerate PO since her DC from ED on 1/18.    Past Medical History:  Diagnosis Date   Addison's disease (HCC)    Brain tumor (benign) (HCC)    Hypertension    Marijuana use    Per pt: "medical marijuana patient"   Pancreatitis    PCOS (polycystic ovarian syndrome)    Pituitary tumor    Seizures (HCC)        Home Medications Prior to Admission medications   Medication Sig Start Date End Date Taking? Authorizing Provider  acetaminophen (TYLENOL) 325 MG tablet Take 2 tablets (650 mg total) by mouth every 6 (six) hours as needed for mild pain (or Fever >/= 101). 08/21/21   Sheikh, Omair Latif, DO  albuterol (VENTOLIN HFA) 108 (90 Base) MCG/ACT inhaler Inhale 1-2 puffs into the lungs every 6 (six) hours as needed for wheezing or shortness of breath. 03/16/23   Henderly,  Britni A, PA-C  benzonatate (TESSALON) 100 MG capsule Take 1 capsule (100 mg total) by mouth every 8 (eight) hours. 03/16/23   Henderly, Britni A, PA-C  levETIRAcetam (KEPPRA) 500 MG tablet Take 1 tablet (500 mg total) by mouth 2 (two) times daily. 03/06/22   Alicia Amel, MD  methadone (DOLOPHINE) 10 MG/ML solution Take 120 mg by mouth daily.    [provider]  ondansetron (ZOFRAN-ODT) 8 MG disintegrating tablet Take 1 tablet (8 mg total) by mouth every 8 (eight) hours as needed for nausea or vomiting. 04/20/23   Margarita Grizzle, MD      Allergies    Bee venom, Toradol [ketorolac tromethamine], and Benadryl [diphenhydramine]    Review of Systems   Review of Systems A 10 point review of systems was performed and is negative unless otherwise reported in HPI.  Physical Exam Updated Vital Signs BP (!) 135/104   Pulse 71   Temp 98.5 F (36.9 C) (Oral)   Resp 14   Ht 5\' 2"  (1.575 m)   Wt 64 kg   SpO2 98%   BMI 25.81 kg/m  Physical Exam General: Normal appearing female, lying in bed.  HEENT: PERRLA, EOMI, Sclera anicteric, MMM, trachea midline.  Cardiology: RRR, no murmurs/rubs/gallops.  Resp: Normal respiratory rate and effort. CTAB, no wheezes, rhonchi, crackles.  Abd: Soft, non-tender, non-distended. No rebound tenderness or guarding.  GU: Deferred. MSK: No peripheral edema or signs of trauma. Extremities without deformity or TTP.  Skin: warm, dry.  Neuro: A&Ox4, Mild right-sided facial droop noted, decreased sensation to R side of face. 4/5 strength in RUE, 3+ strength in RLE, 5/5 strength in LUE/LLE. Sensation grossly diminished in RUE/RLEs. Tongue protrudes midline. Intact visual fields. Psych: Normal mood and affect.   1a  Level of consciousness: 0=alert; keenly responsive  1b. LOC questions:  0=Performs both tasks correctly  1c. LOC commands: 0=Performs both tasks correctly  2.  Best Gaze: 0=normal  3.  Visual: 0=No visual loss  4. Facial Palsy: 1=Minor paralysis  (flattened nasolabial fold, asymmetric on smiling)  5a.  Motor left arm: 0=No drift, limb holds 90 (or 45) degrees for full 10 seconds  5b.  Motor right arm: 1=Drift, limb holds 90 (or 45) degrees but drifts down before full 10 seconds: does not hit bed  6a. motor left leg: 0=No drift, limb holds 90 (or 45) degrees for full 10 seconds  6b  Motor right leg:  2=Some effort against gravity, limb cannot get to or maintain (if cured) 90 (or 45) degrees, drifts down to bed, but has some effort against gravity  7. Limb Ataxia: 1=Present in one limb  8.  Sensory: 1=Mild to moderate sensory loss; patient feels pinprick is less sharp or is dull on the affected side; there is a loss of superficial pain with pinprick but patient is aware She is being touched  9. Best Language:  0=No aphasia, normal  10. Dysarthria: 1=Mild to moderate, patient slurs at least some words and at worst, can be understood with some difficulty  11. Extinction and Inattention: 0=No abnormality   Total:   7        ED Results / Procedures / Treatments   Labs (all labs ordered are listed, but only abnormal results are displayed) Labs Reviewed  COMPREHENSIVE METABOLIC PANEL - Abnormal; Notable for the following components:      Result Value   Potassium 5.2 (*)    Calcium 8.8 (*)    All other components within normal limits  CBC WITH DIFFERENTIAL/PLATELET  MAGNESIUM  ETHANOL  URINALYSIS, ROUTINE W REFLEX MICROSCOPIC  RAPID URINE DRUG SCREEN, HOSP PERFORMED  CBG MONITORING, ED    EKG None  Radiology CT ANGIO HEAD NECK W WO CM Result Date: 04/22/2023 CLINICAL DATA:  Neuro deficit, acute, stroke suspected. Right-sided facial, arm, and leg numbness. EXAM: CT ANGIOGRAPHY HEAD AND NECK WITH AND WITHOUT CONTRAST TECHNIQUE: Multidetector CT imaging of the head and neck was performed using the standard protocol during bolus administration of intravenous contrast. Multiplanar CT image reconstructions and MIPs were obtained to  evaluate the vascular anatomy. Carotid stenosis measurements (when applicable) are obtained utilizing NASCET criteria, using the distal internal carotid diameter as the denominator. RADIATION DOSE REDUCTION: This exam was performed according to the departmental dose-optimization program which includes automated exposure control, adjustment of the mA and/or kV according to patient size and/or use of iterative reconstruction technique. CONTRAST:  80mL OMNIPAQUE IOHEXOL 350 MG/ML SOLN COMPARISON:  Head CT 08/17/2022 FINDINGS: CT HEAD FINDINGS Brain: There is no evidence of an acute infarct, intracranial hemorrhage, mass, midline shift, or extra-axial fluid collection. Cerebral volume is normal. The ventricles are normal in size. A partially empty sella is unchanged. Vascular: No hyperdense vessel. Skull: No acute fracture or suspicious osseous lesion. Sinuses/Orbits: Mild mucosal thickening in the paranasal sinuses. Clear mastoid air cells. Unremarkable orbits. Other: None. 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 Widely patent arch vessel origins. Right carotid system: Patent without evidence of stenosis, dissection, or significant atherosclerosis. Left carotid system: Patent without evidence of stenosis, dissection, or significant atherosclerosis. Vertebral arteries: Patent without evidence of stenosis, dissection, or significant atherosclerosis allowing for suboptimal assessment of the left V1 segment due to venous contrast. Mildly to moderately dominant left vertebral artery. Skeleton: Moderate cervical spondylosis. Focally severe left facet arthrosis at C4-5 with grade 1 anterolisthesis. Other neck: No evidence of cervical lymphadenopathy or mass. Upper chest: Clear lung apices. Review of the MIP images confirms the above findings CTA HEAD FINDINGS Motion  artifact limits detailed assessment of the intracranial arteries. Anterior circulation: The intracranial internal carotid arteries and proximal ACAs and MCAs are patent, however motion artifact limits assessment for stenosis and limits assessment of the branch vessels. No sizable aneurysm is identified. Posterior circulation: The intracranial vertebral arteries are widely patent to the basilar. The basilar artery is widely patent. Both PCAs are patent proximally with assessment for stenosis limited by motion. No sizable aneurysm is identified. Venous sinuses: Patent. Anatomic variants: None. Review of the MIP images confirms the above findings IMPRESSION: 1. No evidence of acute intracranial abnormality. 2. Motion degraded CTA. No large vessel occlusion or evidence of a significant stenosis in the neck. Nondiagnostic assessment for intracranial stenosis. Electronically Signed   By: Sebastian Ache M.D.   On: 04/22/2023 08:33    Procedures Procedures    Medications Ordered in ED Medications  iohexol (OMNIPAQUE) 350 MG/ML injection 100 mL (80 mLs Intravenous Contrast Given 04/22/23 0809)  acetaminophen (TYLENOL) tablet 1,000 mg (1,000 mg Oral Given 04/22/23 0905)  metoCLOPramide (REGLAN) injection 10 mg (10 mg Intravenous Given 04/22/23 0905)  magnesium sulfate IVPB 2 g 50 mL (0 g Intravenous Stopped 04/22/23 0950)  lactated ringers bolus 1,000 mL (0 mLs Intravenous Stopped 04/22/23 0950)    ED Course/ Medical Decision Making/ A&P                          Medical Decision Making Amount and/or Complexity of Data Reviewed Labs: ordered. Decision-making details documented in ED Course. Radiology: ordered. Decision-making details documented in ED Course.  Risk OTC drugs. Prescription drug management.    This patient presents to the ED for concern of R-sided weakness/numbness, this involves an extensive number of treatment options, and is a complaint that carries with it a high risk of complications  and morbidity.  I considered the following differential and admission for this acute, potentially life threatening condition.   MDM:    Given the acute onset of neurological symptoms, hemorrhagic or ischemic stroke is the most concerning etiology of these acute symptoms. The neuro exam is significant for Mild r-sided facial droop, R sided arm/leg weakness and decreased sensation to light touch. NIHSS on my evaluation is 7.  Plan to obtain emergent CT brain/CTA H&N. Also consider intracranial mass, electrolyte derangement, complex migraine.   LKN: 10 AM yesterday Glucose: 83 mg/dL AC: no BP: 409/811 Patient is out of the window for TNK if neg head CT  Clinical Course as of 04/22/23 1158  Mon Apr 22, 2023  0800 CBC with Differential wnl [HN]  0803 Glucose-Capillary: 84 [HN]  0803 Patient told radiology tech that she "cannot breathe" when she gets IV contrast.  Per documentation patient had received IV contrast without complication many times in the last several years.  She has no documented allergy.  Will move forward with  CTA head and neck. [HN]  [id number] Potassium(!): 5.2 Mild hyperkalemia. No hypercalemia, hyponatremia, or hypoglycemia.  [HN]  0837 CT ANGIO HEAD NECK W WO CM 1. No evidence of acute intracranial abnormality. 2. Motion degraded CTA. No large vessel occlusion or evidence of a significant stenosis in the neck. Nondiagnostic assessment for intracranial stenosis.   [HN]  [id number] D/w Dr. Wilford Corner with neurology, who recommends headache cocktail to see if symptoms improve. If they do, likely complex migraine. If not, will need transfer to [facility] for MRI.  [HN]  [phone] Patient has allergy listed to ketorolac and Benadryl.  Will give headache cocktail including Tylenol, Reglan, magnesium, and 1 L IV fluid as she is likely dehydrated with nausea vomiting. [HN]  1033 Patient's headache somewhat improved. Her sensory and strength symptoms on the right side not improve w/ HA cocktail. Accepted for  ED to ED transfer at [facility] by Dr. Fulton Reek. MRI brain ordered with and without contrast, as patient does have h/o pituitary tumor.  [HN]    Clinical Course User Index [HN] Loetta Rough, MD    Labs: I Ordered, and personally interpreted labs.  The pertinent results include:  those listed above  Imaging Studies ordered: I ordered imaging studies including CTH, CTA H&N I independently visualized and interpreted imaging. I agree with the radiologist interpretation  Additional history obtained from chart review, son at bedside.   Cardiac Monitoring: The patient was maintained on a cardiac monitor.  I personally viewed and interpreted the cardiac monitored which showed an underlying rhythm of: NSR  Reevaluation: After the interventions noted above, I reevaluated the patient and found that they have :stayed the same  Social Determinants of Health: Lives independently  Disposition:  Transfer to [facility], accepted by Dr. Earlene Plater, for MRI  Co morbidities that complicate the patient evaluation  Past Medical History:  Diagnosis Date   Addison's disease (HCC)    Brain tumor (benign) (HCC)    Hypertension    Marijuana use    Per pt: "medical marijuana patient"   Pancreatitis    PCOS (polycystic ovarian syndrome)    Pituitary tumor    Seizures (HCC)      Medicines Meds ordered this encounter  Medications   iohexol (OMNIPAQUE) 350 MG/ML injection 100 mL   acetaminophen (TYLENOL) tablet 1,000 mg   metoCLOPramide (REGLAN) injection 10 mg   magnesium sulfate IVPB 2 g 50 mL   lactated ringers bolus 1,000 mL    I have reviewed the patients home medicines and have made adjustments as needed  Problem List / ED Course: Problem List Items Addressed This Visit   None Visit Diagnoses       Acute right-sided weakness    -  Primary     Nausea and vomiting, unspecified vomiting type                       This note was created using dictation software, which may contain  spelling or grammatical errors.    Loetta Rough, MD 04/22/23 1200

## 2023-04-22 NOTE — ED Notes (Signed)
Patient report received from [facility], patient BP en route 170/110, no changes in status otherwise.

## 2023-04-22 NOTE — ED Triage Notes (Signed)
C/o R sided numbness in face, arm, and leg since yesterday around 1200. A&O x4. No facial droop noted. Hx of addison's.   Seen here on 1/18 for similar symptoms.

## 2023-04-22 NOTE — ED Notes (Signed)
Report given to [facility] ED Charge.Marland KitchenMarland Kitchen

## 2023-04-22 NOTE — ED Notes (Signed)
Pt aware of the need for a urine... Unable to currently provide the sample... 

## 2023-04-22 NOTE — H&P (Addendum)
History and Physical    Brittany Mullins YQI:[id number] DOB: 07/11/1980 DOA: 04/22/2023  PCP: Patient, No Pcp Per  Patient coming from: [facility] via [facility]  I have personally briefly reviewed patient's old medical records in [facility]  Chief Complaint: Right-sided weakness and numbness  HPI: Brittany Mullins is a 43 y.o. female with medical history significant of gastritis/duodenitis, prior history of Addison's disease, pituitary tumor, empty sella syndrome, hypertension, opiate dependence on methadone, bipolar disorder, THC use, PCOS, seizures presenting to the ED with right-sided weakness and numbness. The patient states around 12 PM the day prior to admission noted right-sided numbness in the right face, right upper extremity, right lower extremity with associated right-sided weakness.  Patient does endorse some right facial weakness as well is dysarthric speech.  Patient also endorsed some visual difficulties with blurriness and inability to read a sentence yesterday.  Patient does endorse 4 bouts of nausea and vomiting as well as a bout of coffee-ground emesis on the day of admission.  Patient endorses headache.  Patient denied any fevers, no chills, no chest pain, no shortness of breath, no abdominal pain, no diarrhea, no constipation, no melena, no hematochezia.  Patient did endorse a bout of coffee-ground emesis.  Patient denies any dysuria.  Patient with complaints of significant lightheadedness and dizziness from lying to sitting position.  Patient also endorsed that she had a bout of emesis after taking her methadone this morning. Patient states was on prednisone in the past however it made her very jittery and as such has not taking any prednisone.  Per boyfriend at bedside over the past month patient has been having increasing bouts of nausea vomiting dizziness and not feeling well in general. It is also noted that patient seen in the ED 04/20/2023 for nausea  vomiting and dizziness treated with IV fluids and IV Solu-Medrol, droperidol with improvement in his symptoms and discharged on Zofran.  ED Course: Patient seen in the [facility], CT angiogram head and neck done with no evidence of acute intracranial abnormality, no LVO or evidence of significant stenosis in the neck.  Vital signs noted to be stable with systolic blood pressure as high as 157/95.  Basic metabolic profile with a potassium of 5.2 otherwise unremarkable.  CBC done unremarkable.  Fasting lipid panel obtained in the ED with a LDL of 97, total cholesterol 162.  Urinalysis done nitrite negative, leukocytes negative, specific gravity > 1.046.  Alcohol level < 10.  UDS positive for THC.  Patient subsequently sent to [facility] ED for MRI of the brain which was positive for acute left pontine infarct.  Hospitalist were called for admission.  Neurology consulted.  Review of Systems: As per HPI otherwise all other systems reviewed and are negative.  Past Medical History:  Diagnosis Date   Addison's disease (HCC)    Brain tumor (benign) (HCC)    Hypertension    Marijuana use    Per pt: "medical marijuana patient"   Pancreatitis    PCOS (polycystic ovarian syndrome)    Pituitary tumor    Seizures (HCC)     Past Surgical History:  Procedure Laterality Date   ABDOMINAL HYSTERECTOMY     APPENDECTOMY     BIOPSY  08/16/2021   Procedure: BIOPSY;  Surgeon: Shellia Cleverly, DO;  Location: [facility];  Service: Gastroenterology;;   CESAREAN SECTION     CHOLECYSTECTOMY     ELBOW SURGERY     ESOPHAGOGASTRODUODENOSCOPY (EGD) WITH PROPOFOL N/A 08/16/2021  Procedure: ESOPHAGOGASTRODUODENOSCOPY (EGD) WITH PROPOFOL;  Surgeon: Shellia Cleverly, DO;  Location: [facility];  Service: Gastroenterology;  Laterality: N/A;   kidney stent     pancreatic stent      Social History  reports that she has never smoked. She has never used smokeless tobacco. She reports that she does not currently  use drugs after having used the following drugs: Marijuana. She reports that she does not drink alcohol.  Allergies  Allergen Reactions   Bee Venom Anaphylaxis   Toradol [Ketorolac Tromethamine] Shortness Of Breath   Benadryl [Diphenhydramine] Other (See Comments)    States she cannot breathe    Family History  Problem Relation Age of Onset   Cancer Mother    Hypertension Mother    Diabetes Maternal Grandmother    Diabetes Maternal Grandfather    Mother deceased age 41 from pancreatic cancer.  Father unknown.  Prior to Admission medications   Medication Sig Start Date End Date Taking? Authorizing Provider  acetaminophen (TYLENOL) 325 MG tablet Take 2 tablets (650 mg total) by mouth every 6 (six) hours as needed for mild pain (or Fever >/= 101). 08/21/21  Yes Sheikh, Omair Latif, DO  levETIRAcetam (KEPPRA) 500 MG tablet Take 1 tablet (500 mg total) by mouth 2 (two) times daily. Patient taking differently: Take 500 mg by mouth 3 (three) times daily. 03/06/22  Yes Alicia Amel, MD  methadone (DOLOPHINE) 10 MG/ML solution Take 120 mg by mouth daily.   Yes [provider]  ondansetron (ZOFRAN-ODT) 8 MG disintegrating tablet Take 1 tablet (8 mg total) by mouth every 8 (eight) hours as needed for nausea or vomiting. 04/20/23  Yes Margarita Grizzle, MD  VIENVA 0.1-20 MG-MCG tablet Take 1 tablet by mouth daily. 03/21/23  Yes [provider]  albuterol (VENTOLIN HFA) 108 (90 Base) MCG/ACT inhaler Inhale 1-2 puffs into the lungs every 6 (six) hours as needed for wheezing or shortness of breath. Patient not taking: Reported on 04/22/2023 03/16/23   Linwood Dibbles, PA-C    Physical Exam: Vitals:   04/22/23 1000 04/22/23 1130 04/22/23 1306 04/22/23 1737  BP: (!) 135/104 (!) 157/95  (!) 152/98  Pulse: 71 71  88  Resp: 14 14  20   Temp:   98.5 F (36.9 C) 98.7 F (37.1 C)  TempSrc:      SpO2: 98% 100%  100%  Weight:      Height:        Constitutional: NAD, calm,  comfortable Vitals:   04/22/23 1000 04/22/23 1130 04/22/23 1306 04/22/23 1737  BP: (!) 135/104 (!) 157/95  (!) 152/98  Pulse: 71 71  88  Resp: 14 14  20   Temp:   98.5 F (36.9 C) 98.7 F (37.1 C)  TempSrc:      SpO2: 98% 100%  100%  Weight:      Height:       Eyes: PERRL, lids and conjunctivae normal ENMT: Mucous membranes are dry. Posterior pharynx clear of any exudate or lesions.Normal dentition.  Neck: normal, supple, no masses, no thyromegaly Respiratory: clear to auscultation bilaterally, no wheezing, no crackles. Normal respiratory effort. No accessory muscle use.  Cardiovascular: Regular rate and rhythm, no murmurs / rubs / gallops. No extremity edema. 2+ pedal pulses. No carotid bruits.  Abdomen: no tenderness, no masses palpated. No hepatosplenomegaly. Bowel sounds positive.  Musculoskeletal: no clubbing / cyanosis. No joint deformity upper and lower extremities. Good ROM, no contractures. Normal muscle tone.  Skin: no rashes, lesions, ulcers.  No induration Neurologic: Patient with right facial weakness otherwise rest of cranial nerve exam intact.  Sensation intact.  Dysmetria on the right finger-to-nose.  Heel-to-shin intact bilaterally.  5/5 left upper extremity strength, 5/5 left lower extremity strength.  3/5 right upper extremity strength.  3/5 right lower extremity strength.  Unable to elicit reflexes symmetrically and bilaterally.  Gait not tested secondary to safety.  Psychiatric: Normal judgment and insight. Alert and oriented x 3. Normal mood.   Labs on Admission: I have personally reviewed following labs and imaging studies  CBC: Recent Labs  Lab 04/20/23 1057 04/22/23 0752  WBC 5.9 7.4  NEUTROABS  --  4.4  HGB 13.6 14.5  HCT 39.6 42.0  MCV 89.8 89.7  PLT 245 257    Basic Metabolic Panel: Recent Labs  Lab 04/20/23 1057 04/22/23 0752 04/22/23 1752  NA 137 135 136  K 3.7 5.2* 3.2*  CL 102 101 100  CO2 28 27 24   GLUCOSE 71 84 117*  BUN 17 17 10    CREATININE 0.97 1.00 0.92  CALCIUM 8.7* 8.8* 8.2*  MG  --  2.0  --     GFR: Estimated Creatinine Clearance: 71.5 mL/min (by C-G formula based on SCr of 0.92 mg/dL).  Liver Function Tests: Recent Labs  Lab 04/20/23 1057 04/22/23 0752  AST 23 29  ALT 14 13  ALKPHOS 48 44  BILITOT 0.3 0.4  PROT 7.3 7.1  ALBUMIN 4.3 4.2    Urine analysis:    Component Value Date/Time   COLORURINE YELLOW 04/22/2023 0752   APPEARANCEUR CLEAR 04/22/2023 0752   LABSPEC >1.046 (H) 04/22/2023 0752   PHURINE 6.5 04/22/2023 0752   GLUCOSEU NEGATIVE 04/22/2023 0752   HGBUR NEGATIVE 04/22/2023 0752   BILIRUBINUR NEGATIVE 04/22/2023 0752   BILIRUBINUR negative 06/12/2017 1053   KETONESUR NEGATIVE 04/22/2023 0752   PROTEINUR NEGATIVE 04/22/2023 0752   UROBILINOGEN 0.2 05/18/2020 1104   NITRITE NEGATIVE 04/22/2023 0752   LEUKOCYTESUR NEGATIVE 04/22/2023 0752    Radiological Exams on Admission: Portable chest 1 View Result Date: 04/22/2023 CLINICAL DATA:  CVA EXAM: PORTABLE CHEST 1 VIEW COMPARISON:  03/16/2023 FINDINGS: No consolidation or effusion. Small nodular focus of opacity at the right base suspect summation shadow or inflammatory given rapid appearance. Normal cardiomediastinal silhouette. No pneumothorax IMPRESSION: No active disease. Small nodular focus of opacity at the right base suspect summation shadow or inflammatory given rapid appearance, attention on two-view chest radiograph follow-up. Electronically Signed   By: Jasmine Pang M.D.   On: 04/22/2023 18:57   MR Brain W and Wo Contrast Result Date: 04/22/2023 CLINICAL DATA:  Headache, neuro deficit. EXAM: MRI HEAD WITHOUT AND WITH CONTRAST TECHNIQUE: Multiplanar, multiecho pulse sequences of the brain and surrounding structures were obtained without and with intravenous contrast. CONTRAST:  6mL GADAVIST GADOBUTROL 1 MMOL/ML IV SOLN COMPARISON:  Head and neck CTA 04/22/2023 FINDINGS: The study is moderately motion degraded. Brain: There is  a 10 x 6 mm acute left pontine perforator infarct. No brain parenchymal signal abnormality is identified elsewhere within limitations of motion artifact. No intracranial hemorrhage, mass, midline shift, extra-axial fluid collection, or abnormal enhancement is identified. Cerebral volume is normal. The ventricles are normal in size. 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 Vascular: Major intracranial vascular flow voids are preserved. Skull and upper cervical spine: Diffusely diminished bone marrow T1 signal intensity, nonspecific though can be seen with anemia, smoking, and obesity. Sinuses/Orbits: Unremarkable orbits. Mild mucosal thickening in the paranasal sinuses. Clear mastoid air cells.  Other: None. IMPRESSION: Acute left pontine infarct. Electronically Signed   By: Sebastian Ache M.D.   On: 04/22/2023 17:05   CT ANGIO HEAD NECK W WO CM Result Date: 04/22/2023 CLINICAL DATA:  Neuro deficit, acute, stroke suspected. Right-sided facial, arm, and leg numbness. EXAM: CT ANGIOGRAPHY HEAD AND NECK WITH AND WITHOUT CONTRAST TECHNIQUE: Multidetector CT imaging of the head and neck was performed using the standard protocol during bolus administration of intravenous contrast. Multiplanar CT image reconstructions and MIPs were obtained to evaluate the vascular anatomy. Carotid stenosis measurements (when applicable) are obtained utilizing NASCET criteria, using the distal internal carotid diameter as the denominator. RADIATION DOSE REDUCTION: This exam was performed according to the departmental dose-optimization program which includes automated exposure control, adjustment of the mA and/or kV according to patient size and/or use of iterative reconstruction technique. CONTRAST:  80mL OMNIPAQUE IOHEXOL 350 MG/ML SOLN COMPARISON:  Head CT 08/17/2022 FINDINGS: CT HEAD FINDINGS Brain: There is no evidence of an acute infarct, intracranial hemorrhage, mass, midline shift, or extra-axial fluid collection. Cerebral volume is  normal. The ventricles are normal in size. A partially empty sella is unchanged. Vascular: No hyperdense vessel. Skull: No acute fracture or suspicious osseous lesion. Sinuses/Orbits: Mild mucosal thickening in the paranasal sinuses. Clear mastoid air cells. Unremarkable orbits. Other: None. 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 Widely patent arch vessel origins. Right carotid system: Patent without evidence of stenosis, dissection, or significant atherosclerosis. Left carotid system: Patent without evidence of stenosis, dissection, or significant atherosclerosis. Vertebral arteries: Patent without evidence of stenosis, dissection, or significant atherosclerosis allowing for suboptimal assessment of the left V1 segment due to venous contrast. Mildly to moderately dominant left vertebral artery. Skeleton: Moderate cervical spondylosis. Focally severe left facet arthrosis at C4-5 with grade 1 anterolisthesis. Other neck: No evidence of cervical lymphadenopathy or mass. Upper chest: Clear lung apices. Review of the MIP images confirms the above findings CTA HEAD FINDINGS Motion artifact limits detailed assessment of the intracranial arteries. Anterior circulation: The intracranial internal carotid arteries and proximal ACAs and MCAs are patent, however motion artifact limits assessment for stenosis and limits assessment of the branch vessels. No sizable aneurysm is identified. Posterior circulation: The intracranial vertebral arteries are widely patent to the basilar. The basilar artery is widely patent. Both PCAs are patent proximally with assessment for stenosis limited by motion. No sizable aneurysm is identified. Venous sinuses: Patent. Anatomic variants: None. Review of the MIP images confirms the above findings IMPRESSION: 1. No evidence of acute  intracranial abnormality. 2. Motion degraded CTA. No large vessel occlusion or evidence of a significant stenosis in the neck. Nondiagnostic assessment for intracranial stenosis. Electronically Signed   By: Sebastian Ache M.D.   On: 04/22/2023 08:33    EKG: Not done.  Assessment/Plan Principal Problem:   Stroke (cerebrum) (HCC) Active Problems:   Opioid dependence (HCC)   Hypokalemia   Dehydration   History of Addison's disease   Gastritis and gastroduodenitis   Intractable vomiting with nausea   Bipolar disorder (HCC)   Hyperkalemia   Dizziness   Acute right-sided weakness   Acute intractable headache    #1 acute left pontine infarct -Patient presented with right-sided upper extremity weakness and numbness with right facial weakness as well as right facial numbness which started at 12 PM the day prior to admission.  Patient not a tPA candidate  due to delayed presentation. -CT angiogram head and neck done with no acute abnormality noted, no LVO, no evidence of significant stenosis in the neck. -MRI brain done positive for acute left pontine infarct. -Check a 2D echo, hemoglobin A1c, lower extremity Dopplers to rule out DVT. -Check a EKG. -Permissive hypertension goal to keep blood pressure < 220/120 in the next 24 to 48 hours. -LDL obtained at 97 and as such we will start Lipitor 40 mg daily with goal LDL < 70. -Hypercoagulable panel ordered by neurology. -PT/OT/SLP -Neurology consulted and patient seen by neurology PA and patient loaded with Plavix 300 mg and subsequently to be placed on 75 mg of Plavix daily. -Appreciate neurology input and recommendations.  2.  History of adrenal insufficiency/Addison's disease -Patient noted with a history of Addison's disease and per patient and boyfriend used to be on prednisone in the past however did not like the way it made her feel and she subsequently discontinued taking it. -Patient states she was in the process of establishing with a  PCP in order to get a referral to endocrinology in the outpatient setting. -Patient over the past month has been having intermittent symptoms of nausea vomiting lightheadedness and dizziness and just not feeling well. -Check orthostatics.  Initial lab work with hyperkalemia although repeat be met now with hypokalemia. -Check orthostatics. -IV fluids normal saline 1 L bolus and then 125 cc an hour for the next 24 to 48 hours. -Place on hydrocortisone 10 mg every morning hydrocortisone 5 mg q. supper. -Supportive care.  3.  Hypokalemia -Initial potassium was elevated repeat lab work now with a potassium of 3.2. -Check a magnesium level. -K-Dur 40 mEq p.o. x 1.  4.  Dehydration -IV fluids.  5.  History of gastritis/duodenitis/history of peptic ulcer disease per patient -IV PPI pending swallow evaluation and when on a diet could transition to oral PPI.  6.  Opioid dependence -Resume home regimen methadone.  7.  Headache -Will give a dose of IV Compazine 5 mg x 1 followed by morphine 2 mg IV x 1.  8.  History of seizures -Resume home regimen Keppra.  DVT prophylaxis: Lovenox Code Status:  Full Family Communication:  Updated patient and boyfriend at bedside. Disposition Plan:   Patient is from:  Home  Anticipated DC to:  Home  Anticipated DC date:  2 to 3 days  Anticipated DC barriers: Clinical improvement  Consults called: Neurology Admission status:  Admit to inpatient/medical attending  Severity of Illness: The appropriate patient status for this patient is INPATIENT. Inpatient status is judged to be reasonable and necessary in order to provide the required intensity of service to ensure the patient's safety. The patient's presenting symptoms, physical exam findings, and initial radiographic and laboratory data in the context of their chronic comorbidities is felt to place them at high risk for further clinical deterioration. Furthermore, it is not anticipated that the patient  will be medically stable for discharge from the hospital within 2 midnights of admission.   * I certify that at the point of admission it is my clinical judgment that the patient will require inpatient hospital care spanning beyond 2 midnights from the point of admission due to high intensity of service, high risk for further deterioration and high frequency of surveillance required.*    Ramiro Harvest MD [facility]  How to contact the [facility] Attending or Consulting provider 7A - 7P or covering provider during after hours 7P -7A, for this patient?   Check  the care team in [facility] and look for a) attending/consulting [facility] provider listed and b) the [facility] team listed Log into [email or web address] and use [place]'s universal password to access. If you do not have the password, please contact the hospital operator. Locate the [facility] provider you are looking for under [facility] and page to a number that you can be directly reached. If you still have difficulty reaching the provider, please page the [facility] (Director on Call) for the Hospitalists listed on amion for assistance.  04/22/2023, 7:01 PM

## 2023-04-22 NOTE — ED Notes (Signed)
Report given to [facility]. 

## 2023-04-22 NOTE — ED Notes (Signed)
Called [facility] at [facility] for transport to [facility] ED; Dr. Marlene Bast receiving 11:35

## 2023-04-22 NOTE — ED Provider Notes (Signed)
Physical Exam  BP (!) 157/95   Pulse 71   Temp 98.5 F (36.9 C) (Oral)   Resp 14   Ht 5\' 2"  (1.575 m)   Wt 64 kg   SpO2 100%   BMI 25.81 kg/m   Physical Exam Vitals and nursing note reviewed.  Constitutional:      General: She is not in acute distress.    Appearance: She is well-developed.  HENT:     Head: Normocephalic and atraumatic.  Eyes:     Conjunctiva/sclera: Conjunctivae normal.  Cardiovascular:     Rate and Rhythm: Normal rate and regular rhythm.     Heart sounds: No murmur heard. Pulmonary:     Effort: Pulmonary effort is normal. No respiratory distress.     Breath sounds: Normal breath sounds.  Abdominal:     Palpations: Abdomen is soft.     Tenderness: There is no abdominal tenderness.  Musculoskeletal:        General: No swelling.     Cervical back: Neck supple.  Skin:    General: Skin is warm and dry.     Capillary Refill: Capillary refill takes less than 2 seconds.  Neurological:     Mental Status: She is alert.     Comments: Patient awake alert.  Normal speech.  Face symmetric.  Cranial nerves are intact.  No visual field cuts.  No neglect.  Slight drift in the right upper and right lower extremity and 4-5 strength in the right upper and lower extremity.  Normal strength and sensation on the left.  Sensation is normal on the right.  Psychiatric:        Mood and Affect: Mood normal.     Procedures  Procedures  ED Course / MDM   Clinical Course as of 04/22/23 1244  Mon Apr 22, 2023  0800 CBC with Differential wnl [HN]  0803 Glucose-Capillary: 84 [HN]  0803 Patient told radiology tech that she "cannot breathe" when she gets IV contrast.  Per documentation patient had received IV contrast without complication many times in the last several years.  She has no documented allergy.  Will move forward with CTA head and neck. [HN]  [id number] Potassium(!): 5.2 Mild hyperkalemia. No hypercalemia, hyponatremia, or hypoglycemia.  [HN]  0837 CT ANGIO HEAD NECK W  WO CM 1. No evidence of acute intracranial abnormality. 2. Motion degraded CTA. No large vessel occlusion or evidence of a significant stenosis in the neck. Nondiagnostic assessment for intracranial stenosis.   [HN]  [id number] D/w Dr. Wilford Corner with neurology, who recommends headache cocktail to see if symptoms improve. If they do, likely complex migraine. If not, will need transfer to [facility] for MRI.  [HN]  [phone] Patient has allergy listed to ketorolac and Benadryl.  Will give headache cocktail including Tylenol, Reglan, magnesium, and 1 L IV fluid as she is likely dehydrated with nausea vomiting. [HN]  1033 Patient's headache somewhat improved. Her sensory and strength symptoms on the right side not improve w/ HA cocktail. Accepted for ED to ED transfer at [facility] by Dr. Fulton Reek. MRI brain ordered with and without contrast, as patient does have h/o pituitary tumor.  [HN]    Clinical Course User Index [HN] Loetta Rough, MD   Medical Decision Making Amount and/or Complexity of Data Reviewed Labs: ordered. Decision-making details documented in ED Course. Radiology: ordered. Decision-making details documented in ED Course.  Risk OTC drugs. Prescription drug management.    Patient transferred from freestanding ED.  Presented with  right-sided numbness and weakness as well as headache since yesterday.  Outside of TNK window.  CTA does not show any bleeding or LVO.  Discussed with neurology who recommended headache cocktail.  This was not improved symptoms.  Neurology had recommended transfer for MRI if symptoms persisted.  Patient here states her symptoms are not improved or worsened.  She still has decree sensation on the right arm and leg as well as weakness in the right arm and leg which is new.  No speech difficulties.  Exam as above.  Will give Ativan for MRI.  Headache is persistent as well.  MRI is pending.  Patient is stable.  Handoff given to Dr. Renaye Rakers will plan to follow-up MRI and  reassess.      Laurence Spates, MD 04/22/23 [phone]

## 2023-04-22 NOTE — Consult Note (Signed)
NEUROLOGY CONSULT NOTE   Date of service: April 22, 2023 Patient Name: Brittany Mullins MRN:  [id number] DOB:  24-Aug-1980 Chief Complaint: "numbness in face, and right arm and leg, weakness" Requesting Provider: Rodolph Bong, MD  History of Present Illness  Brittany Mullins is a 43 y.o. female who has a past medical history of Addison's disease (HCC), Brain tumor (benign) (HCC), Hypertension, Marijuana use, Pancreatitis, PCOS (polycystic ovarian syndrome), Pituitary tumor, and Seizures (HCC). who presents with numbness in her face and right arm and leg, and weakness in both extremities as well as a facial droop.  She states the symptoms started yesterday around lunchtime,the symptoms have gotten progressively worse and her family brought her to the hospital to be evaluated.  She states that it started with numbness in her face and her right arm and leg then she noticed that the right side of her face was droopy, and then progressed to have weakness where she was not able to do much with her right hand and her leg started dragging while she was walking.  Endorses having right calf pain that has been ongoing for about a week.  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  CTA head with no acute LVO.  Neurology consulted for stroke workup  NIHSS 1a Level of Conscious.: 0 1b LOC Questions: 0 1c LOC Commands: 0 2 Best Gaze: 0 3 Visual: 0 4 Facial Palsy: 1 5a Motor Arm - left: 0 5b Motor Arm - Right: 1 6a Motor Leg - Left: 0 6b Motor Leg - Right: 2 7 Limb Ataxia: 0 8 Sensory: 1 9 Best Language: 0 10 Dysarthria: 1 11 Extinct. and Inatten.: 0 TOTAL: 6  ROS  Comprehensive ROS performed and pertinent positives documented in HPI    Past History   Past Medical History:  Diagnosis Date   Addison's disease (HCC)    Brain tumor (benign) (HCC)    Hypertension    Marijuana use    Per pt: "medical marijuana patient"   Pancreatitis     PCOS (polycystic ovarian syndrome)    Pituitary tumor    Seizures (HCC)     Past Surgical History:  Procedure Laterality Date   ABDOMINAL HYSTERECTOMY     APPENDECTOMY     BIOPSY  08/16/2021   Procedure: BIOPSY;  Surgeon: Brittany Cleverly, DO;  Location: [facility];  Service: Gastroenterology;;   CESAREAN SECTION     CHOLECYSTECTOMY     ELBOW SURGERY     ESOPHAGOGASTRODUODENOSCOPY (EGD) WITH PROPOFOL N/A 08/16/2021   Procedure: ESOPHAGOGASTRODUODENOSCOPY (EGD) WITH PROPOFOL;  Surgeon: Brittany Cleverly, DO;  Location: [facility];  Service: Gastroenterology;  Laterality: N/A;   kidney stent     pancreatic stent      Family History: Family History  Problem Relation Age of Onset   Cancer Mother    Hypertension Mother    Diabetes Maternal Grandmother    Diabetes Maternal Grandfather     Social History  reports that she has never smoked. She has never used smokeless tobacco. She reports that she does not currently use drugs after having used the following drugs: Marijuana. She reports that she does not drink alcohol.  Allergies  Allergen Reactions   Bee Venom Anaphylaxis   Toradol [Ketorolac Tromethamine] Shortness Of Breath   Benadryl [Diphenhydramine] Other (See Comments)    States she cannot breathe    Medications  No current facility-administered medications for this encounter.  Current Outpatient Medications:  acetaminophen (TYLENOL) 325 MG tablet, Take 2 tablets (650 mg total) by mouth every 6 (six) hours as needed for mild pain (or Fever >/= 101)., Disp: 20 tablet, Rfl: 0   levETIRAcetam (KEPPRA) 500 MG tablet, Take 1 tablet (500 mg total) by mouth 2 (two) times daily. (Patient taking differently: Take 500 mg by mouth 3 (three) times daily.), Disp: 60 tablet, Rfl: 1   methadone (DOLOPHINE) 10 MG/ML solution, Take 120 mg by mouth daily., Disp: , Rfl:    ondansetron (ZOFRAN-ODT) 8 MG disintegrating tablet, Take 1 tablet (8 mg total) by mouth every 8 (eight) hours  as needed for nausea or vomiting., Disp: 20 tablet, Rfl: 0   VIENVA 0.1-20 MG-MCG tablet, Take 1 tablet by mouth daily., Disp: , Rfl:    albuterol (VENTOLIN HFA) 108 (90 Base) MCG/ACT inhaler, Inhale 1-2 puffs into the lungs every 6 (six) hours as needed for wheezing or shortness of breath. (Patient not taking: Reported on 04/22/2023), Disp: 6.7 g, Rfl: 0  Vitals   Vitals:   04/22/23 1000 04/22/23 1130 04/22/23 1306 04/22/23 1737  BP: (!) 135/104 (!) 157/95  (!) 152/98  Pulse: 71 71  88  Resp: 14 14  20   Temp:   98.5 F (36.9 C) 98.7 F (37.1 C)  TempSrc:      SpO2: 98% 100%  100%  Weight:      Height:        Body mass index is 25.81 kg/m.  Physical Exam   Constitutional: Appears well-developed and well-nourished.  Psych: Affect appropriate to situation.  Eyes: No scleral injection.  HENT: No OP obstruction.  Head: Normocephalic.  Cardiovascular: Normal rate and regular rhythm.  Respiratory: Effort normal, non-labored breathing.  GI: Soft.  No distension. There is no tenderness.  Skin: WDI.   Neurologic Examination   Mental Status -  Level of arousal and orientation to time, place, and person were intact. Language including expression, naming, repetition, comprehension was assessed and found intact. Attention span and concentration were normal. Recent and remote memory were intact. Fund of Knowledge was assessed and was intact.  Cranial Nerves II - XII - II - Visual field intact OU. III, IV, VI - Extraocular movements intact. V - Facial sensation intact bilaterally. VII -right facial droop VIII - Hearing & vestibular intact bilaterally. X - Palate elevates symmetrically. XI - Chin turning & shoulder shrug intact bilaterally. XII - Tongue protrusion intact.  Motor Strength - Right arm 4/5 with drift, right leg 3/5 with drift and hits the bed before 10 seconds.  The patient's strength was normal in left upper and lower extremities and pronator drift was absent.  Bulk  was normal and fasciculations were absent.   Motor Tone - Muscle tone was assessed at the neck and appendages and was normal. Sensory -decreased on right arm and leg Coordination - The patient had limb movements with no ataxia or dysmetria.  Tremor was absent. Gait and Station - deferred.  Labs/Imaging/Neurodiagnostic studies   CBC:  Recent Labs  Lab 2023/05/07 1057 04/22/23 0752  WBC 5.9 7.4  NEUTROABS  --  4.4  HGB 13.6 14.5  HCT 39.6 42.0  MCV 89.8 89.7  PLT 245 257    Basic Metabolic Panel:  Lab Results  Component Value Date   NA 135 04/22/2023   K 5.2 (H) 04/22/2023   CO2 27 04/22/2023   GLUCOSE 84 04/22/2023   BUN 17 04/22/2023   CREATININE 1.00 04/22/2023   CALCIUM 8.8 (L) 04/22/2023  GFRNONAA >60 04/22/2023   GFRAA >60 10/27/2019    Lipid Panel: No results found for: "LDLCALC"  HgbA1c: No results found for: "HGBA1C"  Urine Drug Screen:     Component Value Date/Time   LABOPIA NONE DETECTED 04/22/2023 0752   COCAINSCRNUR NONE DETECTED 04/22/2023 0752   LABBENZ NONE DETECTED 04/22/2023 0752   AMPHETMU NONE DETECTED 04/22/2023 0752   THCU POSITIVE (A) 04/22/2023 0752   LABBARB NONE DETECTED 04/22/2023 0752     Alcohol Level     Component Value Date/Time   ETH <10 04/22/2023 0752    INR  Lab Results  Component Value Date   INR 0.97 12/21/2017    APTT No results found for: "APTT"  AED levels: No results found for: "PHENYTOIN", "ZONISAMIDE", "LAMOTRIGINE", "LEVETIRACETA"    CT Head without contrast(Personally reviewed): No acute process  CT angio Head and Neck with contrast(Personally reviewed): No LVO  MRI Brain(Personally reviewed): Acute left pontine infarct   ASSESSMENT  43 year old female with a PMHx significant for seizures (on Keppra) and HTN who presents to the emergency department for evaluation of acute onset of right face, arm and leg numbness with weakness. Symptoms started yesterday at noon. MRI positive for pontine  stroke. - Exam reveals right sided sensory deficits with weakness.  - NIHSS 6 - MRI brain (personally reviewed): Acute paramedian left pontine ischemic infarction.  - CTA of head and neck: No large vessel occlusion or evidence of a significant stenosis in the neck. Nondiagnostic assessment for intracranial stenosis due to motion artifact limiting detailed assessment of the intracranial arteries.. - Stroke risk factors: HTN  RECOMMENDATIONS  - Admit for stroke workup  - HgbA1c, fasting lipid panel - Frequent neuro checks - TTE  - Will likely need TEE if TTE is negative - May need to repeat CTA of head with sedation due to low resolution of the initial scan, limiting assessment for stenosis of proximal vessels and being no useful diagnostic information for distal branch vessels - Hypercoag panel  - [place] LE to r/o DVT - Prophylactic therapy-Antiplatelet med:   plavix 75mg  daily  after 300mg  load. No ASA as she has allergy to NSAID - Statin - BP management per standard protocol. Out of the permissive HTN time window.  - Risk factor modification - Telemetry monitoring - PT consult, OT consult, Speech consult - Continue her home Keppra - Continue her home methadone - Stroke team to follow ______________________________________________________________________  Gevena Mart DNP, ACNPC-AG  [facility]   I have seen and examined the patient. I have reviewed the assessment and recommendations and made amendations as needed. 43 year old female with acute left paramedian pontine infarction. Exam reveals right sided weakness and sensory deficits. Recommendations as above.  Electronically signed: Dr. Caryl Pina

## 2023-04-22 NOTE — ED Provider Notes (Signed)
43 yo female presenting as transfer from free standing ED with right sided numbness and weakness CT imaging no acute findings Pt here pending MRI brain w/ and w/o contrast  Physical Exam  BP (!) 157/95   Pulse 71   Temp 98.5 F (36.9 C)   Resp 14   Ht 5\' 2"  (1.575 m)   Wt 64 kg   SpO2 100%   BMI 25.81 kg/m   Physical Exam  Procedures  Procedures  ED Course / MDM   Clinical Course as of 04/22/23 1540  Mon Apr 22, 2023  0800 CBC with Differential wnl [HN]  0803 Glucose-Capillary: 84 [HN]  0803 Patient told radiology tech that she "cannot breathe" when she gets IV contrast.  Per documentation patient had received IV contrast without complication many times in the last several years.  She has no documented allergy.  Will move forward with CTA head and neck. [HN]  [id number] Potassium(!): 5.2 Mild hyperkalemia. No hypercalemia, hyponatremia, or hypoglycemia.  [HN]  0837 CT ANGIO HEAD NECK W WO CM 1. No evidence of acute intracranial abnormality. 2. Motion degraded CTA. No large vessel occlusion or evidence of a significant stenosis in the neck. Nondiagnostic assessment for intracranial stenosis.   [HN]  [id number] D/w Dr. Wilford Corner with neurology, who recommends headache cocktail to see if symptoms improve. If they do, likely complex migraine. If not, will need transfer to [facility] for MRI.  [HN]  [phone] Patient has allergy listed to ketorolac and Benadryl.  Will give headache cocktail including Tylenol, Reglan, magnesium, and 1 L IV fluid as she is likely dehydrated with nausea vomiting. [HN]  1033 Patient's headache somewhat improved. Her sensory and strength symptoms on the right side not improve w/ HA cocktail. Accepted for ED to ED transfer at [facility] by Dr. Fulton Reek. MRI brain ordered with and without contrast, as patient does have h/o pituitary tumor.  [HN]    Clinical Course User Index [HN] Loetta Rough, MD   Medical Decision Making Amount and/or Complexity of Data Reviewed Labs:  ordered. Decision-making details documented in ED Course. Radiology: ordered. Decision-making details documented in ED Course.  Risk OTC drugs. Prescription drug management. Decision regarding hospitalization.   5 pm -patient's MRI reviewed, concerning for an acute left pontine infarct.  This would correlate with the patient's symptoms of right-sided paresthesia and weakness of her right arm and leg, which is persistent on my exam.  She has 3 out of 5 strength in her right upper and lower extremity, difficulty walking without assistance.  I clarified with the patient and her husband and her symptoms began yesterday afternoon around lunchtime, 12 or 1 PM, with paresthesia of the right side of her body and subsequent weakness.  She denies to me prior history of diabetes, smoking, high blood pressure high cholesterol.  She does report a history of a "brain tumor" that was resected years ago.  I will consult with neurology, admit to the hospital.  The patient and her husband are both updated       Deja Pisarski, Kermit Balo, MD 04/22/23 [phone]

## 2023-04-22 NOTE — ED Provider Notes (Signed)
I was summoned in the room by the patient's nurse as she was reporting agitation and emotional lability.  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  It is not clear what is causing this reaction.  Her blood pressure is elevated but I am not certain this is accurate either as the patient is not willing to sit still for blood pressure.  I do not see hypoxia to suggest an acute pulmonary embolism.  She continues to have the same symptoms she was noted to have earlier with right-sided weakness.  Patient reports he cannot tolerate or take Benadryl.  Instead I ordered 4 mg of IV Versed as well as Zofran for nausea.  I have notified the admission team who had assumed care of her to evaluate the patient.   Terald Sleeper, MD 04/22/23 2020

## 2023-04-22 NOTE — ED Notes (Signed)
RN notified Jearld Fenton, MD of patient's description of symptoms and timing. RN notified also provider of patients initial NIH score. She advised to not call the stroke alert. Provider at bedside atm.

## 2023-04-22 NOTE — ED Notes (Signed)
Pt/Family are silencing the pump when it beeps... Pt/Family informed to not touch/turn off the pump or to turn off the beeping... Pt/Family informed why... Pt/Family stated "ok"...

## 2023-04-22 NOTE — ED Notes (Signed)
ED TO INPATIENT HANDOFF REPORT  ED Nurse Name and Phone #: Renae Fickle RN [phone]  S Name/Age/Gender Brittany Mullins 43 y.o. female Room/Bed: 045C/045C  Code Status   Code Status: Full Code  Home/SNF/Other Home Patient oriented to: self, place, time, and situation Is this baseline? Yes   Triage Complete: Triage complete  Chief Complaint Stroke (cerebrum) [facility]) [I63.9]  Triage Note C/o R sided numbness in face, arm, and leg since yesterday around 1200. A&O x4. No facial droop noted. Hx of addison's.   Seen here on 1/18 for similar symptoms.    Allergies Allergies  Allergen Reactions   Bee Venom Anaphylaxis   Toradol [Ketorolac Tromethamine] Shortness Of Breath   Benadryl [Diphenhydramine] Other (See Comments)    States she cannot breathe    Level of Care/Admitting Diagnosis ED Disposition     ED Disposition  Admit   Condition  --   Comment  Hospital Area: [facility] [[id number]]  Level of Care: Telemetry Medical [[id number]]  May admit patient to [facility] or [facility] if equivalent level of care is available:: No  Covid Evaluation: Asymptomatic - no recent exposure (last 10 days) testing not required  Diagnosis: Stroke (cerebrum) [facility]) [[id number]]  Admitting Physician: Rodolph Bong [[id number]]  Attending Physician: Rodolph Bong [[id number]]  Certification:: I certify this patient will need inpatient services for at least 2 midnights  Expected Medical Readiness: 04/24/2023          B Medical/Surgery History Past Medical History:  Diagnosis Date   Addison's disease (HCC)    Brain tumor (benign) (HCC)    Hypertension    Marijuana use    Per pt: "medical marijuana patient"   Pancreatitis    PCOS (polycystic ovarian syndrome)    Pituitary tumor    Seizures (HCC)    Past Surgical History:  Procedure Laterality Date   ABDOMINAL HYSTERECTOMY     APPENDECTOMY     BIOPSY  08/16/2021   Procedure: BIOPSY;  Surgeon: Shellia Cleverly, DO;   Location: [facility] ENDOSCOPY;  Service: Gastroenterology;;   CESAREAN SECTION     CHOLECYSTECTOMY     ELBOW SURGERY     ESOPHAGOGASTRODUODENOSCOPY (EGD) WITH PROPOFOL N/A 08/16/2021   Procedure: ESOPHAGOGASTRODUODENOSCOPY (EGD) WITH PROPOFOL;  Surgeon: Shellia Cleverly, DO;  Location: [facility] ENDOSCOPY;  Service: Gastroenterology;  Laterality: N/A;   kidney stent     pancreatic stent       A IV Location/Drains/Wounds Patient Lines/Drains/Airways Status     Active Line/Drains/Airways     Name Placement date Placement time Site Days   Peripheral IV 04/22/23 20 G Left Antecubital 04/22/23  0748  Antecubital  less than 1   Peripheral IV 04/22/23 20 G Anterior;Proximal;Right Forearm 04/22/23  2014  Forearm  less than 1            Intake/Output Last 24 hours  Intake/Output Summary (Last 24 hours) at 04/22/2023 2155 Last data filed at 04/22/2023 0950 Gross per 24 hour  Intake 1050 ml  Output --  Net 1050 ml    Labs/Imaging Results for orders placed or performed during the hospital encounter of 04/22/23 (from the past 48 hours)  CBC with Differential     Status: None   Collection Time: 04/22/23  7:52 AM  Result Value Ref Range   WBC 7.4 4.0 - 10.5 K/uL   RBC 4.68 3.87 - 5.11 MIL/uL   Hemoglobin 14.5 12.0 - 15.0 g/dL   HCT 09.8 11.9 - 14.7 %  MCV 89.7 80.0 - 100.0 fL   MCH 31.0 26.0 - 34.0 pg   MCHC 34.5 30.0 - 36.0 g/dL   RDW 09.8 11.9 - 14.7 %   Platelets 257 150 - 400 K/uL   nRBC 0.0 0.0 - 0.2 %   Neutrophils Relative % 59 %   Neutro Abs 4.4 1.7 - 7.7 K/uL   Lymphocytes Relative 31 %   Lymphs Abs 2.3 0.7 - 4.0 K/uL   Monocytes Relative 8 %   Monocytes Absolute 0.6 0.1 - 1.0 K/uL   Eosinophils Relative 1 %   Eosinophils Absolute 0.1 0.0 - 0.5 K/uL   Basophils Relative 1 %   Basophils Absolute 0.0 0.0 - 0.1 K/uL   Immature Granulocytes 0 %   Abs Immature Granulocytes 0.02 0.00 - 0.07 K/uL    Comment: Performed at [facility], [address]  [place], [place] [address]  Comprehensive metabolic panel     Status: Abnormal   Collection Time: 04/22/23  7:52 AM  Result Value Ref Range   Sodium 135 135 - 145 mmol/L   Potassium 5.2 (H) 3.5 - 5.1 mmol/L    Comment: DELTA CHECK NOTED   Chloride 101 98 - 111 mmol/L   CO2 27 22 - 32 mmol/L   Glucose, Bld 84 70 - 99 mg/dL    Comment: Glucose reference range applies only to samples taken after fasting for at least 8 hours.   BUN 17 6 - 20 mg/dL   Creatinine, Ser 2.13 0.44 - 1.00 mg/dL   Calcium 8.8 (L) 8.9 - 10.3 mg/dL   Total Protein 7.1 6.5 - 8.1 g/dL   Albumin 4.2 3.5 - 5.0 g/dL   AST 29 15 - 41 U/L   ALT 13 0 - 44 U/L   Alkaline Phosphatase 44 38 - 126 U/L   Total Bilirubin 0.4 0.0 - 1.2 mg/dL   GFR, Estimated >08 >65 mL/min    Comment: (NOTE) Calculated using the CKD-EPI Creatinine Equation (2021)    Anion gap 7 5 - 15    Comment: Performed at [facility], [address], [place], [place] [address]  Magnesium     Status: None   Collection Time: 04/22/23  7:52 AM  Result Value Ref Range   Magnesium 2.0 1.7 - 2.4 mg/dL    Comment: Performed at [facility], [address], [place], [place] [address]  Urinalysis, Routine w reflex microscopic -Urine, Clean Catch     Status: Abnormal   Collection Time: 04/22/23  7:52 AM  Result Value Ref Range   Color, Urine YELLOW YELLOW   APPearance CLEAR CLEAR   Specific Gravity, Urine >1.046 (H) 1.005 - 1.030   pH 6.5 5.0 - 8.0   Glucose, UA NEGATIVE NEGATIVE mg/dL   Hgb urine dipstick NEGATIVE NEGATIVE   Bilirubin Urine NEGATIVE NEGATIVE   Ketones, ur NEGATIVE NEGATIVE mg/dL   Protein, ur NEGATIVE NEGATIVE mg/dL   Nitrite NEGATIVE NEGATIVE   Leukocytes,Ua NEGATIVE NEGATIVE    Comment: Performed at [facility], [address] [place], [place] [address]  Ethanol     Status: None   Collection Time: 04/22/23  7:52 AM  Result Value Ref Range   Alcohol, Ethyl (B) <10 <10 mg/dL     Comment: (NOTE) Lowest detectable limit for serum alcohol is 10 mg/dL.  For medical purposes only. Performed at [facility], [address], [place], [place] [address]   Rapid urine drug screen (hospital performed)  Status: Abnormal   Collection Time: 04/22/23  7:52 AM  Result Value Ref Range   Opiates NONE DETECTED NONE DETECTED   Cocaine NONE DETECTED NONE DETECTED   Benzodiazepines NONE DETECTED NONE DETECTED   Amphetamines NONE DETECTED NONE DETECTED   Tetrahydrocannabinol POSITIVE (A) NONE DETECTED   Barbiturates NONE DETECTED NONE DETECTED    Comment: (NOTE) DRUG SCREEN FOR MEDICAL PURPOSES ONLY.  IF CONFIRMATION IS NEEDED FOR ANY PURPOSE, NOTIFY LAB WITHIN 5 DAYS.  LOWEST DETECTABLE LIMITS FOR URINE DRUG SCREEN Drug Class                     Cutoff (ng/mL) Amphetamine and metabolites    1000 Barbiturate and metabolites    200 Benzodiazepine                 200 Opiates and metabolites        300 Cocaine and metabolites        300 THC                            50 Performed at [facility], [address], [place], [place] [address]   POC CBG, ED     Status: None   Collection Time: 04/22/23  7:57 AM  Result Value Ref Range   Glucose-Capillary 84 70 - 99 mg/dL    Comment: Glucose reference range applies only to samples taken after fasting for at least 8 hours.  Lipid panel     Status: None   Collection Time: 04/22/23  5:52 PM  Result Value Ref Range   Cholesterol 162 0 - 200 mg/dL   Triglycerides 96 <604 mg/dL   HDL 46 >54 mg/dL   Total CHOL/HDL Ratio 3.5 RATIO   VLDL 19 0 - 40 mg/dL   LDL Cholesterol 97 0 - 99 mg/dL    Comment:        Total Cholesterol/HDL:CHD Risk Coronary Heart Disease Risk Table                     Men   Women  1/2 Average Risk   3.4   3.3  Average Risk       5.0   4.4  2 X Average Risk   9.6   7.1  3 X Average Risk  23.4   11.0        Use the calculated Patient Ratio above and the CHD Risk  Table to determine the patient's CHD Risk.        ATP III CLASSIFICATION (LDL):  <100     mg/dL   Optimal  098-119  mg/dL   Near or Above                    Optimal  130-159  mg/dL   Borderline  147-829  mg/dL   High  >562     mg/dL   Very High Performed at [facility], [address], [place], [place] [address]   Basic metabolic panel     Status: Abnormal   Collection Time: 04/22/23  5:52 PM  Result Value Ref Range   Sodium 136 135 - 145 mmol/L   Potassium 3.2 (L) 3.5 - 5.1 mmol/L   Chloride 100 98 - 111 mmol/L   CO2 24 22 - 32 mmol/L   Glucose, Bld 117 (H) 70 - 99 mg/dL    Comment: Glucose reference range applies  only to samples taken after fasting for at least 8 hours.   BUN 10 6 - 20 mg/dL   Creatinine, Ser 7.82 0.44 - 1.00 mg/dL   Calcium 8.2 (L) 8.9 - 10.3 mg/dL   GFR, Estimated >95 >62 mL/min    Comment: (NOTE) Calculated using the CKD-EPI Creatinine Equation (2021)    Anion gap 12 5 - 15    Comment: Performed at [facility], [address] [place], [place] [address]   Portable chest 1 View Result Date: 04/22/2023 CLINICAL DATA:  CVA EXAM: PORTABLE CHEST 1 VIEW COMPARISON:  03/16/2023 FINDINGS: No consolidation or effusion. Small nodular focus of opacity at the right base suspect summation shadow or inflammatory given rapid appearance. Normal cardiomediastinal silhouette. No pneumothorax IMPRESSION: No active disease. Small nodular focus of opacity at the right base suspect summation shadow or inflammatory given rapid appearance, attention on two-view chest radiograph follow-up. Electronically Signed   By: Jasmine Pang M.D.   On: 04/22/2023 18:57   MR Brain W and Wo Contrast Result Date: 04/22/2023 CLINICAL DATA:  Headache, neuro deficit. EXAM: MRI HEAD WITHOUT AND WITH CONTRAST TECHNIQUE: Multiplanar, multiecho pulse sequences of the brain and surrounding structures were obtained without and with intravenous contrast. CONTRAST:  6mL GADAVIST GADOBUTROL 1  MMOL/ML IV SOLN COMPARISON:  Head and neck CTA 04/22/2023 FINDINGS: The study is moderately motion degraded. Brain: There is a 10 x 6 mm acute left pontine perforator infarct. No brain parenchymal signal abnormality is identified elsewhere within limitations of motion artifact. No intracranial hemorrhage, mass, midline shift, extra-axial fluid collection, or abnormal enhancement is identified. Cerebral volume is normal. The ventricles are normal in size. 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 Vascular: Major intracranial vascular flow voids are preserved. Skull and upper cervical spine: Diffusely diminished bone marrow T1 signal intensity, nonspecific though can be seen with anemia, smoking, and obesity. Sinuses/Orbits: Unremarkable orbits. Mild mucosal thickening in the paranasal sinuses. Clear mastoid air cells. Other: None. IMPRESSION: Acute left pontine infarct. Electronically Signed   By: Sebastian Ache M.D.   On: 04/22/2023 17:05   CT ANGIO HEAD NECK W WO CM Result Date: 04/22/2023 CLINICAL DATA:  Neuro deficit, acute, stroke suspected. Right-sided facial, arm, and leg numbness. EXAM: CT ANGIOGRAPHY HEAD AND NECK WITH AND WITHOUT CONTRAST TECHNIQUE: Multidetector CT imaging of the head and neck was performed using the standard protocol during bolus administration of intravenous contrast. Multiplanar CT image reconstructions and MIPs were obtained to evaluate the vascular anatomy. Carotid stenosis measurements (when applicable) are obtained utilizing NASCET criteria, using the distal internal carotid diameter as the denominator. RADIATION DOSE REDUCTION: This exam was performed according to the departmental dose-optimization program which includes automated exposure control, adjustment of the mA and/or kV according to patient size and/or use of iterative reconstruction technique. CONTRAST:  80mL OMNIPAQUE IOHEXOL 350 MG/ML SOLN COMPARISON:  Head CT 08/17/2022 FINDINGS: CT HEAD FINDINGS Brain: There is no  evidence of an acute infarct, intracranial hemorrhage, mass, midline shift, or extra-axial fluid collection. Cerebral volume is normal. The ventricles are normal in size. A partially empty sella is unchanged. Vascular: No hyperdense vessel. Skull: No acute fracture or suspicious osseous lesion. Sinuses/Orbits: Mild mucosal thickening in the paranasal sinuses. Clear mastoid air cells. Unremarkable orbits. Other: None. 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 Widely patent arch vessel origins. Right carotid system: Patent  without evidence of stenosis, dissection, or significant atherosclerosis. Left carotid system: Patent without evidence of stenosis, dissection, or significant atherosclerosis. Vertebral arteries: Patent without evidence of stenosis, dissection, or significant atherosclerosis allowing for suboptimal assessment of the left V1 segment due to venous contrast. Mildly to moderately dominant left vertebral artery. Skeleton: Moderate cervical spondylosis. Focally severe left facet arthrosis at C4-5 with grade 1 anterolisthesis. Other neck: No evidence of cervical lymphadenopathy or mass. Upper chest: Clear lung apices. Review of the MIP images confirms the above findings CTA HEAD FINDINGS Motion artifact limits detailed assessment of the intracranial arteries. Anterior circulation: The intracranial internal carotid arteries and proximal ACAs and MCAs are patent, however motion artifact limits assessment for stenosis and limits assessment of the branch vessels. No sizable aneurysm is identified. Posterior circulation: The intracranial vertebral arteries are widely patent to the basilar. The basilar artery is widely patent. Both PCAs are patent proximally with assessment for stenosis limited by motion. No sizable aneurysm is identified. Venous sinuses:  Patent. Anatomic variants: None. Review of the MIP images confirms the above findings IMPRESSION: 1. No evidence of acute intracranial abnormality. 2. Motion degraded CTA. No large vessel occlusion or evidence of a significant stenosis in the neck. Nondiagnostic assessment for intracranial stenosis. Electronically Signed   By: Sebastian Ache M.D.   On: 04/22/2023 08:33    Pending Labs Unresulted Labs (From admission, onward)     Start     Ordered   04/23/23 0500  Lipid panel  (Labs)  Tomorrow morning,   R       Comments: Fasting    04/22/23 1811   04/23/23 0500  Magnesium  Tomorrow morning,   R        04/22/23 1830   04/23/23 0500  Phosphorus  Tomorrow morning,   R        04/22/23 1830   04/23/23 0500  Comprehensive metabolic panel  Tomorrow morning,   R        04/22/23 1830   04/23/23 0500  CBC  Tomorrow morning,   R        04/22/23 1830   04/22/23 1816  Antithrombin III  (Hypercoagulable Panel, Comprehensive (PNL))  Once,   R        04/22/23 1815   04/22/23 1816  Protein C activity  (Hypercoagulable Panel, Comprehensive (PNL))  Once,   R        04/22/23 1815   04/22/23 1816  Protein C, total  (Hypercoagulable Panel, Comprehensive (PNL))  Once,   R        04/22/23 1815   04/22/23 1816  Protein S activity  (Hypercoagulable Panel, Comprehensive (PNL))  Once,   R        04/22/23 1815   04/22/23 1816  Protein S, total  (Hypercoagulable Panel, Comprehensive (PNL))  Once,   R        04/22/23 1815   04/22/23 1816  Lupus anticoagulant panel  (Hypercoagulable Panel, Comprehensive (PNL))  Once,   R        04/22/23 1815   04/22/23 1816  Beta-2-glycoprotein i abs, IgG/M/A  (Hypercoagulable Panel, Comprehensive (PNL))  Once,   R        04/22/23 1815   04/22/23 1816  Homocysteine, serum  (Hypercoagulable Panel, Comprehensive (PNL))  Once,   R        04/22/23 1815   04/22/23 1816  Factor 5 leiden  (Hypercoagulable Panel, Comprehensive (PNL))  Once,   R  04/22/23 1815   04/22/23 1816   Prothrombin gene mutation  (Hypercoagulable Panel, Comprehensive (PNL))  Once,   R        04/22/23 1815   04/22/23 1816  Cardiolipin antibodies, IgG, IgM, IgA  (Hypercoagulable Panel, Comprehensive (PNL))  Once,   R        04/22/23 1815   04/22/23 1811  Hemoglobin A1c  (Labs)  Once,   R       Comments: To assess prior glycemic control    04/22/23 1811            Vitals/Pain Today's Vitals   04/22/23 1915 04/22/23 1930 04/22/23 1945 04/22/23 2124  BP: (!) 171/120 (!) 206/117 (!) 211/116   Pulse: 76 88 (!) 106   Resp: 15 (!) 22 14   Temp:    97.9 F (36.6 C)  TempSrc:    Axillary  SpO2: 100% 98% 100%   Weight:      Height:      PainSc:        Isolation Precautions No active isolations  Medications Medications   stroke: early stages of recovery book (has no administration in time range)  clopidogrel (PLAVIX) tablet 75 mg (has no administration in time range)  methadone (DOLOPHINE) 10 MG/ML solution 120 mg (has no administration in time range)  levonorgestrel-ethinyl estradiol (ALESSE) 0.1-20 MG-MCG per tablet 1 tablet (1 tablet Oral Not Given 04/22/23 2025)  levETIRAcetam (KEPPRA) tablet 500 mg (has no administration in time range)  enoxaparin (LOVENOX) injection 40 mg (40 mg Subcutaneous Given 04/22/23 1905)  sodium chloride flush (NS) 0.9 % injection 3 mL (has no administration in time range)  0.9 %  sodium chloride infusion ( Intravenous New Bag/Given 04/22/23 2035)  acetaminophen (TYLENOL) tablet 650 mg (has no administration in time range)    Or  acetaminophen (TYLENOL) suppository 650 mg (has no administration in time range)  senna (SENOKOT) tablet 8.6 mg (has no administration in time range)  polyethylene glycol (MIRALAX / GLYCOLAX) packet 17 g (has no administration in time range)  sorbitol 70 % solution 30 mL (has no administration in time range)  magnesium citrate solution 1 Bottle (has no administration in time range)  ondansetron (ZOFRAN) tablet 4 mg (has no  administration in time range)    Or  ondansetron (ZOFRAN) injection 4 mg (has no administration in time range)  albuterol (PROVENTIL) (2.5 MG/3ML) 0.083% nebulizer solution 2.5 mg (has no administration in time range)  hydrALAZINE (APRESOLINE) injection 10 mg (has no administration in time range)  hydrocortisone (CORTEF) tablet 5 mg (5 mg Oral Not Given 04/22/23 2024)    And  hydrocortisone (CORTEF) tablet 10 mg (has no administration in time range)  pantoprazole (PROTONIX) injection 40 mg (40 mg Intravenous Given 04/22/23 1909)  atorvastatin (LIPITOR) tablet 40 mg (40 mg Oral Given 04/22/23 1908)  iohexol (OMNIPAQUE) 350 MG/ML injection 100 mL (80 mLs Intravenous Contrast Given 04/22/23 0809)  acetaminophen (TYLENOL) tablet 1,000 mg (1,000 mg Oral Given 04/22/23 0905)  metoCLOPramide (REGLAN) injection 10 mg (10 mg Intravenous Given 04/22/23 0905)  magnesium sulfate IVPB 2 g 50 mL (0 g Intravenous Stopped 04/22/23 0950)  lactated ringers bolus 1,000 mL (0 mLs Intravenous Stopped 04/22/23 0950)  LORazepam (ATIVAN) injection 1 mg (1 mg Intravenous Given 04/22/23 1500)  gadobutrol (GADAVIST) 1 MMOL/ML injection 6 mL (6 mLs Intravenous Contrast Given 04/22/23 1553)  clopidogrel (PLAVIX) tablet 300 mg (300 mg Oral Given 04/22/23 1907)  lactated ringers bolus 1,000 mL (1,000 mLs Intravenous  New Bag/Given 04/22/23 1845)  prochlorperazine (COMPAZINE) injection 5 mg (5 mg Intravenous Given 04/22/23 1902)  morphine (PF) 2 MG/ML injection 2 mg (2 mg Intravenous Given 04/22/23 1923)  potassium chloride SA (KLOR-CON M) CR tablet 40 mEq (40 mEq Oral Given 04/22/23 1907)  midazolam (VERSED) injection 4 mg (4 mg Intravenous Given 04/22/23 2016)  ondansetron (ZOFRAN) injection 4 mg (4 mg Intravenous Given 04/22/23 2030)    Mobility walks     Focused Assessments Cardiac Assessment Handoff:  Cardiac Rhythm: Normal sinus rhythm Lab Results  Component Value Date   TROPONINI 0.09 (HH) 06/04/2017   Lab Results   Component Value Date   DDIMER <0.27 03/16/2023   Does the Patient currently have chest pain? No   , Neuro Assessment Handoff:  Swallow screen pass? Yes  Cardiac Rhythm: Normal sinus rhythm NIH Stroke Scale  Dizziness Present: No Headache Present: Yes Interval: Other (Comment) Level of Consciousness (1a.)   : Alert, keenly responsive LOC Questions (1b. )   : Answers both questions correctly LOC Commands (1c. )   : Performs both tasks correctly Best Gaze (2. )  : Normal Visual (3. )  : No visual loss Facial Palsy (4. )    : Minor paralysis Motor Arm, Left (5a. )   : No drift Motor Arm, Right (5b. ) : No drift Motor Leg, Left (6a. )  : No drift Motor Leg, Right (6b. ) : Drift Limb Ataxia (7. ): Absent Sensory (8. )  : Normal, no sensory loss Best Language (9. )  : No aphasia Dysarthria (10. ): Mild-to-moderate dysarthria, patient slurs at least some words and, at worst, can be understood with some difficulty Extinction/Inattention (11.)   : No Abnormality Complete NIHSS TOTAL: 3 Last date known well: 04/21/23 Last time known well: 1200 Neuro Assessment: Exceptions to [facility] Neuro Checks:   Initial (04/22/23 0736)  Has TPA been given? No If patient is a Neuro Trauma and patient is going to OR before floor call report to 4N Charge nurse: [phone] or [phone]  , Pulmonary Assessment Handoff:  Lung sounds:   O2 Device: Room Air      R Recommendations: See Admitting Provider Note  Report given to:   Additional Notes:

## 2023-04-23 ENCOUNTER — Inpatient Hospital Stay (HOSPITAL_COMMUNITY): Payer: Medicare Other

## 2023-04-23 ENCOUNTER — Encounter (HOSPITAL_COMMUNITY): Payer: Self-pay | Admitting: Internal Medicine

## 2023-04-23 DIAGNOSIS — I6389 Other cerebral infarction: Secondary | ICD-10-CM

## 2023-04-23 DIAGNOSIS — I6302 Cerebral infarction due to thrombosis of basilar artery: Secondary | ICD-10-CM

## 2023-04-23 DIAGNOSIS — R569 Unspecified convulsions: Secondary | ICD-10-CM | POA: Diagnosis not present

## 2023-04-23 DIAGNOSIS — R531 Weakness: Secondary | ICD-10-CM | POA: Diagnosis not present

## 2023-04-23 DIAGNOSIS — I639 Cerebral infarction, unspecified: Secondary | ICD-10-CM

## 2023-04-23 DIAGNOSIS — K2970 Gastritis, unspecified, without bleeding: Secondary | ICD-10-CM | POA: Diagnosis not present

## 2023-04-23 DIAGNOSIS — I10 Essential (primary) hypertension: Secondary | ICD-10-CM

## 2023-04-23 DIAGNOSIS — E876 Hypokalemia: Secondary | ICD-10-CM | POA: Diagnosis not present

## 2023-04-23 LAB — COMPREHENSIVE METABOLIC PANEL
ALT: 17 U/L (ref 0–44)
AST: 20 U/L (ref 15–41)
Albumin: 3.1 g/dL — ABNORMAL LOW (ref 3.5–5.0)
Alkaline Phosphatase: 42 U/L (ref 38–126)
Anion gap: 9 (ref 5–15)
BUN: 9 mg/dL (ref 6–20)
CO2: 21 mmol/L — ABNORMAL LOW (ref 22–32)
Calcium: 7.8 mg/dL — ABNORMAL LOW (ref 8.9–10.3)
Chloride: 106 mmol/L (ref 98–111)
Creatinine, Ser: 0.85 mg/dL (ref 0.44–1.00)
GFR, Estimated: >60 mL/min (ref >60)
Glucose, Bld: 92 mg/dL (ref 70–99)
Potassium: 3.7 mmol/L (ref 3.5–5.1)
Sodium: 136 mmol/L (ref 135–145)
Total Bilirubin: 0.5 mg/dL (ref 0.0–1.2)
Total Protein: 5.7 g/dL — ABNORMAL LOW (ref 6.5–8.1)

## 2023-04-23 LAB — ECHOCARDIOGRAM COMPLETE
AR max vel: 2.2 cm2
AV Area VTI: 2.16 cm2
AV Area mean vel: 2.22 cm2
AV Mean grad: 4 mm[Hg]
AV Peak grad: 8.4 mm[Hg]
Ao pk vel: 1.45 m/s
Area-P 1/2: 3.86 cm2
Calc EF: 55.1 %
Height: 62 in
S' Lateral: 2.8 cm
Single Plane A2C EF: 57.4 %
Single Plane A4C EF: 51.7 %
Weight: 2257.51 [oz_av]

## 2023-04-23 LAB — LIPID PANEL
Cholesterol: 153 mg/dL (ref 0–200)
HDL: 43 mg/dL (ref >40)
LDL Cholesterol: 94 mg/dL (ref 0–99)
Total CHOL/HDL Ratio: 3.6 {ratio}
Triglycerides: 82 mg/dL (ref <150)
VLDL: 16 mg/dL (ref 0–40)

## 2023-04-23 LAB — CBC
HCT: 38.7 % (ref 36.0–46.0)
Hemoglobin: 13 g/dL (ref 12.0–15.0)
MCH: 31 pg (ref 26.0–34.0)
MCHC: 33.6 g/dL (ref 30.0–36.0)
MCV: 92.1 fL (ref 80.0–100.0)
Platelets: 207 10*3/uL (ref 150–400)
RBC: 4.2 MIL/uL (ref 3.87–5.11)
RDW: 12.2 % (ref 11.5–15.5)
WBC: 5.1 10*3/uL (ref 4.0–10.5)
nRBC: 0 % (ref 0.0–0.2)

## 2023-04-23 LAB — MAGNESIUM: Magnesium: 2.3 mg/dL (ref 1.7–2.4)

## 2023-04-23 LAB — BASIC METABOLIC PANEL
Anion gap: 10 (ref 5–15)
BUN: 10 mg/dL (ref 6–20)
CO2: 23 mmol/L (ref 22–32)
Calcium: 8.3 mg/dL — ABNORMAL LOW (ref 8.9–10.3)
Chloride: 103 mmol/L (ref 98–111)
Creatinine, Ser: 0.83 mg/dL (ref 0.44–1.00)
GFR, Estimated: >60 mL/min (ref >60)
Glucose, Bld: 166 mg/dL — ABNORMAL HIGH (ref 70–99)
Potassium: 3.8 mmol/L (ref 3.5–5.1)
Sodium: 136 mmol/L (ref 135–145)

## 2023-04-23 LAB — ANTITHROMBIN III: AntiThromb III Func: 95 % (ref 75–120)

## 2023-04-23 LAB — HEMOGLOBIN A1C
Hgb A1c MFr Bld: 5 % (ref 4.8–5.6)
Mean Plasma Glucose: 96.8 mg/dL

## 2023-04-23 LAB — PHOSPHORUS: Phosphorus: 3.1 mg/dL (ref 2.5–4.6)

## 2023-04-23 MED ORDER — ASPIRIN 81 MG PO TBEC
81.0000 mg | DELAYED_RELEASE_TABLET | Freq: Every day | ORAL | Status: DC
  Administered 2023-04-23 – 2023-05-01 (×9): 81 mg via ORAL
  Filled 2023-04-23 (×9): qty 1

## 2023-04-23 MED ORDER — LEVETIRACETAM 500 MG PO TABS
500.0000 mg | ORAL_TABLET | Freq: Three times a day (TID) | ORAL | Status: DC
Start: 2023-04-23 — End: 2023-04-23

## 2023-04-23 MED ORDER — LEVETIRACETAM 500 MG PO TABS
500.0000 mg | ORAL_TABLET | Freq: Three times a day (TID) | ORAL | Status: DC
  Administered 2023-04-23 – 2023-04-30 (×22): 500 mg via ORAL
  Filled 2023-04-23 (×20): qty 1

## 2023-04-23 MED ORDER — PANTOPRAZOLE SODIUM 40 MG PO TBEC
40.0000 mg | DELAYED_RELEASE_TABLET | Freq: Every day | ORAL | Status: DC
  Administered 2023-04-23 – 2023-05-01 (×9): 40 mg via ORAL
  Filled 2023-04-23 (×10): qty 1

## 2023-04-23 MED ORDER — LOSARTAN POTASSIUM 25 MG PO TABS
25.0000 mg | ORAL_TABLET | Freq: Every day | ORAL | Status: DC
  Administered 2023-04-23 – 2023-05-01 (×9): 25 mg via ORAL
  Filled 2023-04-23 (×9): qty 1

## 2023-04-23 MED ORDER — AMLODIPINE BESYLATE 5 MG PO TABS
5.0000 mg | ORAL_TABLET | Freq: Every day | ORAL | Status: DC
  Administered 2023-04-23 – 2023-05-01 (×9): 5 mg via ORAL
  Filled 2023-04-23 (×9): qty 1

## 2023-04-23 NOTE — Progress Notes (Signed)
PROGRESS NOTE    Brittany Mullins [facility]  ZOX:[id number] DOB: Dec 13, 1980 DOA: 04/22/2023 PCP: Patient, No Pcp Per    Chief Complaint  Patient presents with   Numbness    Brief Narrative:  Patient history of Addison's disease, benign brain tumor, hypertension, history of pancreatitis, PCOS, pituitary tumor, seizures, opiate dependence on methadone, bipolar disorder, THC use presented to the ED with right-sided weakness and numbness, right facial numbness with some dysarthric speech.  CT angiogram head and neck negative for LVO.  MRI consistent with a acute left pontine infarct.  Patient admitted for stroke workup.  Neurology consulted.   Assessment & Plan:   Principal Problem:   Stroke (cerebrum) (HCC) Active Problems:   Opioid dependence (HCC)   Hypokalemia   Dehydration   History of Addison's disease   Gastritis and gastroduodenitis   Intractable vomiting with nausea   Bipolar disorder (HCC)   Hyperkalemia   Dizziness   Acute right-sided weakness   Acute intractable headache   HTN (hypertension)  #1 acute left pontine infarct -Patient presented with right-sided upper extremity weakness and numbness with right facial weakness as well as right facial numbness which started at 12 PM the day prior to admission.  Patient not a tPA candidate due to delayed presentation. -CT angiogram head and neck done with no acute abnormality noted, no LVO, no evidence of significant stenosis in the neck. -MRI brain done positive for acute left pontine infarct. -2D echo pending.   -Lower extremity Dopplers to rule out DVT pending.   -Placed on Norvasc 5 mg daily, Cozaar 25 mg daily for BP control and uptitrate as needed for better blood pressure control.  -LDL obtained at 97 and as such patient started on Lipitor 40 mg daily with goal LDL < 70. -Hypercoagulable panel ordered by neurology. -PT/OT/SLP -Neurology consulted and patient loaded with Plavix 300 mg and subsequently placed on 75 mg  of Plavix daily. -Per neurology patient may need TEE if 2D echo is negative. -Appreciate neurology input and recommendations.   2.  History of adrenal insufficiency/Addison's disease -Patient noted with a history of Addison's disease and per patient and boyfriend used to be on prednisone in the past however did not like the way it made her feel and she subsequently discontinued taking it. -Patient states she was in the process of establishing with a PCP in order to get a referral to endocrinology in the outpatient setting. -Patient over the past month has been having intermittent symptoms of nausea vomiting lightheadedness and dizziness and just not feeling well. -Patient noted not to be orthostatic initially however had received fluid boluses in the ED prior to orthostatics being checked.   -Initial potassium on admission was elevated.   -Patient received a bolus of normal saline currently on IV fluids.  -Patient started on hydrocortisone 10 mg every morning and 5 mg with supper.   -Supportive care.   -Will need outpatient follow-up with endocrinology.    3.  Hypokalemia -Repleted.    4.  Dehydration -IV fluids.   5.  History of gastritis/duodenitis/history of peptic ulcer disease per patient -Transition from IV PPI to oral PPI daily.     6.  Opioid dependence -Resumed home regimen methadone.   7.  Headache -Patient given a dose of IV Compazine 5 mg followed by morphine 2 mg IV times with some improvement with headache yesterday however still with ongoing headache.   -IV Compazine as needed, IV morphine as needed, will also defer to neurology.  8.  History of seizures -Continue home regimen Keppra.  9.  Hypertension -Start Norvasc 5 mg daily, Cozaar 25 mg daily and uptitrate as needed for better blood pressure control.     DVT prophylaxis: Lovenox Code Status: Full Family Communication: Updated patient.  No family at bedside. Disposition: TBD pending PT  evaluation.  Status is: Inpatient Remains inpatient appropriate because: Severity of illness   Consultants:  Neurology: Dr. Otelia Limes 04/22/2023  Procedures:  CT angiogram head and neck 04/22/2023 MRI head 04/22/2023 2D echo pending Chest x-ray 04/22/2023 Lower extremity Dopplers pending  Antimicrobials: Anti-infectives (From admission, onward)    None         Subjective: Patient lying in bed.  States she feels a little bit better.  Still with some dysarthric speech.  States slight improvement with right-sided weakness and numbness.  Still with a headache but a little bit better.  Denies any further emesis.  Per RN patient with some nausea.  Noted to have tolerated breakfast this morning.  Objective: Vitals:   04/22/23 2145 04/22/23 2245 04/23/23 0511 04/23/23 0802  BP: (!) 149/99 (!) 166/113 (!) 159/104 (!) 139/98  Pulse: 77 87 71 78  Resp: 19 20 18 17   Temp:   98.1 F (36.7 C) 98.4 F (36.9 C)  TempSrc:   Oral Oral  SpO2: 100% 100% 100% 100%  Weight:      Height:        Intake/Output Summary (Last 24 hours) at 04/23/2023 0909 Last data filed at 04/22/2023 0950 Gross per 24 hour  Intake 1050 ml  Output --  Net 1050 ml   Filed Weights   04/22/23 0735  Weight: 64 kg    Examination:  General exam: Appears calm and comfortable  Respiratory system: Clear to auscultation.  No wheezes, no crackles, no rhonchi.  Respiratory effort normal. Cardiovascular system: S1 & S2 heard, RRR. No JVD, murmurs, rubs, gallops or clicks. No pedal edema. Gastrointestinal system: Abdomen is nondistended, soft and nontender. No organomegaly or masses felt. Normal bowel sounds heard. Central nervous system: Alert and oriented.  Patient with right facial weakness otherwise rest of cranial nerve exam intact. Sensation intact. Dysmetria on the right finger-to-nose. Heel-to-shin intact bilaterally. 5/5 left upper extremity strength, 5/5 left lower extremity strength. 3/5 right upper extremity  strength. 3/5 right lower extremity strength. Unable to elicit reflexes symmetrically and bilaterally. Gait not tested secondary to safety.  Extremities: Symmetric 5 x 5 power. Skin: No rashes, lesions or ulcers Psychiatry: Judgement and insight appear normal. Mood & affect appropriate.     Data Reviewed: I have personally reviewed following labs and imaging studies  CBC: Recent Labs  Lab 04/20/23 1057 04/22/23 0752 04/23/23 0510  WBC 5.9 7.4 5.1  NEUTROABS  --  4.4  --   HGB 13.6 14.5 13.0  HCT 39.6 42.0 38.7  MCV 89.8 89.7 92.1  PLT 245 257 207    Basic Metabolic Panel: Recent Labs  Lab 04/20/23 1057 04/22/23 0752 04/22/23 1752 04/23/23 0510  NA 137 135 136 136  K 3.7 5.2* 3.2* 3.7  CL 102 101 100 106  CO2 28 27 24  21*  GLUCOSE 71 84 117* 92  BUN 17 17 10 9   CREATININE 0.97 1.00 0.92 0.85  CALCIUM 8.7* 8.8* 8.2* 7.8*  MG  --  2.0  --  2.3  PHOS  --   --   --  3.1    GFR: Estimated Creatinine Clearance: 77.4 mL/min (by C-G formula based on SCr  of 0.85 mg/dL).  Liver Function Tests: Recent Labs  Lab 04/20/23 1057 04/22/23 0752 04/23/23 0510  AST 23 29 20   ALT 14 13 17   ALKPHOS 48 44 42  BILITOT 0.3 0.4 0.5  PROT 7.3 7.1 5.7*  ALBUMIN 4.3 4.2 3.1*    CBG: Recent Labs  Lab 04/22/23 0757  GLUCAP 84     No results found for this or any previous visit (from the past 240 hours).       Radiology Studies: Portable chest 1 View Result Date: 04/22/2023 CLINICAL DATA:  CVA EXAM: PORTABLE CHEST 1 VIEW COMPARISON:  03/16/2023 FINDINGS: No consolidation or effusion. Small nodular focus of opacity at the right base suspect summation shadow or inflammatory given rapid appearance. Normal cardiomediastinal silhouette. No pneumothorax IMPRESSION: No active disease. Small nodular focus of opacity at the right base suspect summation shadow or inflammatory given rapid appearance, attention on two-view chest radiograph follow-up. Electronically Signed   By: Jasmine Pang M.D.   On: 04/22/2023 18:57   MR Brain W and Wo Contrast Result Date: 04/22/2023 CLINICAL DATA:  Headache, neuro deficit. EXAM: MRI HEAD WITHOUT AND WITH CONTRAST TECHNIQUE: Multiplanar, multiecho pulse sequences of the brain and surrounding structures were obtained without and with intravenous contrast. CONTRAST:  6mL GADAVIST GADOBUTROL 1 MMOL/ML IV SOLN COMPARISON:  Head and neck CTA 04/22/2023 FINDINGS: The study is moderately motion degraded. Brain: There is a 10 x 6 mm acute left pontine perforator infarct. No brain parenchymal signal abnormality is identified elsewhere within limitations of motion artifact. No intracranial hemorrhage, mass, midline shift, extra-axial fluid collection, or abnormal enhancement is identified. Cerebral volume is normal. The ventricles are normal in size. 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 Vascular: Major intracranial vascular flow voids are preserved. Skull and upper cervical spine: Diffusely diminished bone marrow T1 signal intensity, nonspecific though can be seen with anemia, smoking, and obesity. Sinuses/Orbits: Unremarkable orbits. Mild mucosal thickening in the paranasal sinuses. Clear mastoid air cells. Other: None. IMPRESSION: Acute left pontine infarct. Electronically Signed   By: Sebastian Ache M.D.   On: 04/22/2023 17:05   CT ANGIO HEAD NECK W WO CM Result Date: 04/22/2023 CLINICAL DATA:  Neuro deficit, acute, stroke suspected. Right-sided facial, arm, and leg numbness. EXAM: CT ANGIOGRAPHY HEAD AND NECK WITH AND WITHOUT CONTRAST TECHNIQUE: Multidetector CT imaging of the head and neck was performed using the standard protocol during bolus administration of intravenous contrast. Multiplanar CT image reconstructions and MIPs were obtained to evaluate the vascular anatomy. Carotid stenosis measurements (when applicable) are obtained utilizing NASCET criteria, using the distal internal carotid diameter as the denominator. RADIATION DOSE REDUCTION: This  exam was performed according to the departmental dose-optimization program which includes automated exposure control, adjustment of the mA and/or kV according to patient size and/or use of iterative reconstruction technique. CONTRAST:  80mL OMNIPAQUE IOHEXOL 350 MG/ML SOLN COMPARISON:  Head CT 08/17/2022 FINDINGS: CT HEAD FINDINGS Brain: There is no evidence of an acute infarct, intracranial hemorrhage, mass, midline shift, or extra-axial fluid collection. Cerebral volume is normal. The ventricles are normal in size. A partially empty sella is unchanged. Vascular: No hyperdense vessel. Skull: No acute fracture or suspicious osseous lesion. Sinuses/Orbits: Mild mucosal thickening in the paranasal sinuses. Clear mastoid air cells. Unremarkable orbits. Other: None. 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 Widely patent arch vessel origins. Right carotid  system: Patent without evidence of stenosis, dissection, or significant atherosclerosis. Left carotid system: Patent without evidence of stenosis, dissection, or significant atherosclerosis. Vertebral arteries: Patent without evidence of stenosis, dissection, or significant atherosclerosis allowing for suboptimal assessment of the left V1 segment due to venous contrast. Mildly to moderately dominant left vertebral artery. Skeleton: Moderate cervical spondylosis. Focally severe left facet arthrosis at C4-5 with grade 1 anterolisthesis. Other neck: No evidence of cervical lymphadenopathy or mass. Upper chest: Clear lung apices. Review of the MIP images confirms the above findings CTA HEAD FINDINGS Motion artifact limits detailed assessment of the intracranial arteries. Anterior circulation: The intracranial internal carotid arteries and proximal ACAs and MCAs are patent, however motion artifact limits assessment for  stenosis and limits assessment of the branch vessels. No sizable aneurysm is identified. Posterior circulation: The intracranial vertebral arteries are widely patent to the basilar. The basilar artery is widely patent. Both PCAs are patent proximally with assessment for stenosis limited by motion. No sizable aneurysm is identified. Venous sinuses: Patent. Anatomic variants: None. Review of the MIP images confirms the above findings IMPRESSION: 1. No evidence of acute intracranial abnormality. 2. Motion degraded CTA. No large vessel occlusion or evidence of a significant stenosis in the neck. Nondiagnostic assessment for intracranial stenosis. Electronically Signed   By: Sebastian Ache M.D.   On: 04/22/2023 08:33        Scheduled Meds:  amLODipine  5 mg Oral Daily   aspirin EC  81 mg Oral Daily   atorvastatin  40 mg Oral Daily   clopidogrel  75 mg Oral Daily   enoxaparin (LOVENOX) injection  40 mg Subcutaneous Q24H   hydrocortisone  5 mg Oral QAC supper   And   hydrocortisone  10 mg Oral Q breakfast   levETIRAcetam  500 mg Oral Q8H   levonorgestrel-ethinyl estradiol  1 tablet Oral Daily   losartan  25 mg Oral Daily   methadone  120 mg Oral Daily   pantoprazole (PROTONIX) IV  40 mg Intravenous Q24H   senna  1 tablet Oral BID   sodium chloride flush  3 mL Intravenous Q12H   Continuous Infusions:  sodium chloride 75 mL/hr at 04/22/23 2035     LOS: 1 day    Time spent: 40 minutes    Ramiro Harvest, MD [facility]   To contact the attending provider between 7A-7P or the covering provider during after hours 7P-7A, please log into the web site [email or web address] and access using universal [place] password for that web site. If you do not have the password, please call the hospital operator.  04/23/2023, 9:09 AM

## 2023-04-23 NOTE — Evaluation (Signed)
Speech Language Pathology Evaluation Patient Details Name: Brittany Mullins MRN: [id number] DOB: 04/30/1980 Today's Date: 04/23/2023 Time: 4098-1191 SLP Time Calculation (min) (ACUTE ONLY): 25 min  Problem List:  Patient Active Problem List   Diagnosis Date Noted   HTN (hypertension) 04/23/2023   Stroke (cerebrum) (HCC) 04/22/2023   Hyperkalemia 04/22/2023   Dizziness 04/22/2023   Acute right-sided weakness 04/22/2023   Acute intractable headache 04/22/2023   Intractable vomiting with nausea 08/17/2022   Abdominal pain, epigastric    Gastritis and gastroduodenitis    AKI (acute kidney injury) (HCC) 08/15/2021   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   Intractable nausea and vomiting 10/25/2019   History of Addison's disease    Intractable cyclical vomiting with nausea    Opioid dependence (HCC) 06/03/2017   Hypokalemia 06/03/2017   Dehydration 06/03/2017   Generalized abdominal pain    Hypomagnesemia    Marijuana use    Chronic pancreatitis (HCC) 01/21/2014   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   Bipolar disorder (HCC) 07/07/2012   Past Medical History:  Past Medical History:  Diagnosis Date   Addison's disease (HCC)    Brain tumor (benign) (HCC)    Hypertension    Marijuana use    Per pt: "medical marijuana patient"   Pancreatitis    PCOS (polycystic ovarian syndrome)    Pituitary tumor    Seizures (HCC)    Past Surgical History:  Past Surgical History:  Procedure Laterality Date   ABDOMINAL HYSTERECTOMY     APPENDECTOMY     BIOPSY  08/16/2021   Procedure: BIOPSY;  Surgeon: Shellia Cleverly, DO;  Location: [facility];  Service: Gastroenterology;;   CESAREAN SECTION     CHOLECYSTECTOMY     ELBOW SURGERY     ESOPHAGOGASTRODUODENOSCOPY (EGD) WITH PROPOFOL N/A 08/16/2021   Procedure: ESOPHAGOGASTRODUODENOSCOPY (EGD) WITH PROPOFOL;  Surgeon: Shellia Cleverly, DO;  Location: [facility];  Service: Gastroenterology;  Laterality: N/A;   kidney  stent     pancreatic stent     HPI:  Brittany Mullins is a 43 y.o. female who presented to ED 04/22/23 with right sided weakness, facial numbness and asymmetry. 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 PMHx Addison's disease, brain tumor (benign), Hypertension, Marijuana use, opiod dependence - methadone, Pancreatitis, PCOS (polycystic ovarian syndrome), Pituitary tumor, and Seizures.   Assessment / Plan / Recommendation Clinical Impression  Ms. Blasco presents with a moderate dysarthria of speech c/b imprecise consonants and decreased modulation of breath support/timing.  No aphasia - followed three step commands, left/right discrimination, word discrimination WNL. Fluency, naming WNL. Cognition WFL. She acknowledges baseline anxiety; has support of significant other with whom she lives.  Recommend SLP tx while admitted to address speech intelligibility. Pt was able to immediately implement strategies to improve clarity.  Will follow and await OT/PT recs for disposition.    SLP Assessment  SLP Recommendation/Assessment: Patient needs continued Speech Lanaguage Pathology Services SLP Visit Diagnosis: Dysarthria and anarthria (R47.1)    Recommendations for follow up therapy are one component of a multi-disciplinary discharge planning process, led by the attending physician.  Recommendations may be updated based on patient status, additional functional criteria and insurance authorization.    Follow Up Recommendations  Other (comment) (tba)    Assistance Recommended at Discharge  Intermittent Supervision/Assistance  Functional Status Assessment Patient has had a recent decline in their functional status and demonstrates the ability to make significant improvements in function in a reasonable and predictable amount of time.  Frequency and Duration min  2x/week  1 week      SLP Evaluation Cognition  Overall Cognitive Status: Within Functional Limits for tasks assessed        Comprehension  Auditory Comprehension Overall Auditory Comprehension: Appears within functional limits for tasks assessed Yes/No Questions: Within Functional Limits Commands: Within Functional Limits Visual Recognition/Discrimination Discrimination: Within Function Limits Reading Comprehension Reading Status: Within funtional limits    Expression Expression Primary Mode of Expression: Verbal Verbal Expression Overall Verbal Expression: Appears within functional limits for tasks assessed Initiation: No impairment Level of Generative/Spontaneous Verbalization: Conversation Repetition: No impairment Naming: No impairment Pragmatics: No impairment Written Expression Dominant Hand: Right Written Expression: Not tested   Oral / Motor  Oral Motor/Sensory Function Overall Oral Motor/Sensory Function: Mild impairment Facial Symmetry: Abnormal symmetry right;Suspected CN VII (facial) dysfunction Lingual ROM: Within Functional Limits Lingual Symmetry: Within Functional Limits Lingual Strength: Within Functional Limits Velum: Within Functional Limits Mandible: Within Functional Limits Motor Speech Overall Motor Speech: Impaired Respiration: Within functional limits Phonation: Normal Resonance: Within functional limits Articulation: Impaired Level of Impairment: Phrase Intelligibility: Intelligibility reduced Word: 75-100% accurate Phrase: 75-100% accurate Sentence: 75-100% accurate Conversation: 75-100% accurate Motor Planning: Witnin functional limits Motor Speech Errors: Not applicable            Blenda Mounts Laurice 04/23/2023, 9:34 AM Marchelle Folks L. Samson Frederic, MA CCC/SLP Clinical Specialist - Acute Care SLP Acute Rehabilitation Services Office number [phone]

## 2023-04-23 NOTE — Progress Notes (Signed)
Inpatient Rehab Admissions Coordinator Note:   Per therapy recommendations patient was screened for CIR candidacy by Stephania Fragmin, PT. At this time, pt appears to be a potential candidate for CIR. I will place an order for rehab consult for full assessment, per our protocol.  Please contact me any with questions.Estill Dooms, PT, DPT [phone] 04/23/23 12:40 PM

## 2023-04-23 NOTE — Progress Notes (Signed)
Echocardiogram 2D Echocardiogram has been performed.  Brittany Mullins 04/23/2023, 2:11 PM

## 2023-04-23 NOTE — Evaluation (Signed)
Physical Therapy Evaluation Patient Details Name: Brittany Mullins MRN: [id number] DOB: Jul 12, 1980 Today's Date: 04/23/2023  History of Present Illness  43 y.o. female who presented to ED 04/22/23 with right sided weakness, facial numbness and asymmetry. 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 PMHx Addison's disease, brain tumor (benign), Hypertension, Marijuana use, Pancreatitis, PCOS (polycystic ovarian syndrome), Pituitary tumor, and Seizures.  Clinical Impression  PTA pt living with boyfriend in 3rd floor apartment. Pt reports independence in mobility, ADLs and IADLS, does admit that boyfriend reminds her to take her medication. Pt is limited in safe mobility by increased anxiety about new deficits, decreased R sided strength and coordination. Pt requiring min Ax2 for bed mobility and modAx2 for transfer to recliner. Patient will benefit from intensive inpatient follow up therapy, >3 hours/day. PT will continue to follow acutely.     If plan is discharge home, recommend the following: A lot of help with walking and/or transfers;A lot of help with bathing/dressing/bathroom;Assistance with cooking/housework;Assistance with feeding;Direct supervision/assist for medications management;Direct supervision/assist for financial management;Assist for transportation;Help with stairs or ramp for entrance;Supervision due to cognitive status   Can travel by private vehicle    Yes    Equipment Recommendations Rolling walker (2 wheels);BSC/3in1  Recommendations for Other Services  Rehab consult    Functional Status Assessment Patient has had a recent decline in their functional status and demonstrates the ability to make significant improvements in function in a reasonable and predictable amount of time.     Precautions / Restrictions Precautions Precautions: Fall Restrictions Weight Bearing Restrictions Per Provider Order: No      Mobility  Bed Mobility Overal bed mobility: Needs  Assistance Bed Mobility: Supine to Sit     Supine to sit: +2 for physical assistance, Min assist     General bed mobility comments: minAx2 and maximal cuing for for managing LE off bed and bringing trunk to upright, pt very relieved when R knee bends for foot to reach the floor with sitting EoB    Transfers Overall transfer level: Needs assistance Equipment used: Rolling walker (2 wheels) Transfers: Sit to/from Stand, Bed to chair/wheelchair/BSC Sit to Stand: Mod assist, From elevated surface, +2 physical assistance   Step pivot transfers: Mod assist, +2 physical assistance, +2 safety/equipment       General transfer comment: pt with increased anxiety throughout movement, requiring increased cuing for safety, hand placement and sequencing, pt requires physical assist for grasping RW with her R hand due to increased finger flexion, once in upright pt is able to bear weight through RLE pt with increased relief, pt coordination is decreased wtih stepping to recliner, pt with decreased command follow for sitting down in chair by reaching back and lowering herself down to chair. Had pt practice power up to standing and sitting with increased safety.    Ambulation/Gait               General Gait Details: deferred due to boyfriend arrival and decreased attention to movement     Modified Rankin (Stroke Patients Only) Modified Rankin (Stroke Patients Only) Pre-Morbid Rankin Score: No symptoms Modified Rankin: Moderately severe disability     Balance Overall balance assessment: Needs assistance Sitting-balance support: Bilateral upper extremity supported, Feet supported Sitting balance-Leahy Scale: Fair     Standing balance support: Bilateral upper extremity supported, During functional activity, Reliant on assistive device for balance Standing balance-Leahy Scale: Poor  Pertinent Vitals/Pain Pain Assessment Pain Assessment:  Faces Faces Pain Scale: Hurts a little bit Pain Location: generalized with movemen Pain Descriptors / Indicators: Grimacing, Guarding Pain Intervention(s): Limited activity within patient's tolerance, Monitored during session, Repositioned    Home Living Family/patient expects to be discharged to:: Private residence Living Arrangements: Spouse/significant other Available Help at Discharge: Family;Available PRN/intermittently (boyfriend is home at 4pm on weekdays, home on wknds) Type of Home: Apartment Home Access: Stairs to enter Entrance Stairs-Rails: Can reach both Entrance Stairs-Number of Steps: 2 flights   Home Layout: One level Home Equipment: None      Prior Function Prior Level of Function : Independent/Modified Independent;Driving             Mobility Comments: ind ADLs Comments: ind with ADL/IADL, boyfriend reminds pt to takes medications     Extremity/Trunk Assessment   Upper Extremity Assessment Upper Extremity Assessment: Defer to OT evaluation    Lower Extremity Assessment Lower Extremity Assessment: RLE deficits/detail RLE Deficits / Details: R knee in extension on entry, pt very concerned as she could not volitionally bend her knee, PT attempted to perform AAROM for knee flexion and met with increase resistance with PT's increased pressure, similar results with attempts for performing AAROM into R ankle dorsiflexion, however when pt moved to EoB her knee bent easily, pt then able to engage in AROM of knee flexion and extension with increased effort, RLE Sensation: decreased light touch RLE Coordination: decreased gross motor    Cervical / Trunk Assessment Cervical / Trunk Assessment: Normal  Communication   Communication Communication: Difficulty communicating thoughts/reduced clarity of speech (slow speech, some difficulty with word finding and speaking)  Cognition Arousal: Alert Behavior During Therapy: WFL for tasks assessed/performed Overall  Cognitive Status: Within Functional Limits for tasks assessed                                          General Comments General comments (skin integrity, edema, etc.): Pt very anxious throughout session requiring reassurance and calm simple commands for progressing mobility. PT on 2L O2 via [place] on entry SpO2 96%O2, did not assess oxygination on RA at this time    Exercises General Exercises - Lower Extremity Long Arc Quad: AROM, Right, 5 reps, Seated   Assessment/Plan    PT Assessment Patient needs continued PT services  PT Problem List Decreased strength;Decreased range of motion;Decreased activity tolerance;Decreased balance;Decreased mobility;Decreased coordination;Decreased cognition;Decreased safety awareness;Decreased knowledge of use of DME       PT Treatment Interventions DME instruction;Gait training;Functional mobility training;Stair training;Therapeutic activities;Therapeutic exercise;Balance training;Cognitive remediation;Patient/family education    PT Goals (Current goals can be found in the Care Plan section)  Acute Rehab PT Goals Patient Stated Goal: get mobility back PT Goal Formulation: With patient/family Time For Goal Achievement: 05/07/23 Potential to Achieve Goals: Fair    Frequency Min 1X/week     Co-evaluation PT/OT/SLP Co-Evaluation/Treatment: Yes Reason for Co-Treatment: Complexity of the patient's impairments (multi-system involvement);For patient/therapist safety PT goals addressed during session: Mobility/safety with mobility         AM-PAC PT "6 Clicks" Mobility  Outcome Measure Help needed turning from your back to your side while in a flat bed without using bedrails?: A Little Help needed moving from lying on your back to sitting on the side of a flat bed without using bedrails?: A Lot Help needed moving to and from a bed  to a chair (including a wheelchair)?: Total Help needed standing up from a chair using your arms (e.g.,  wheelchair or bedside chair)?: Total Help needed to walk in hospital room?: Total Help needed climbing 3-5 steps with a railing? : Total 6 Click Score: 9    End of Session Equipment Utilized During Treatment: Gait belt;Oxygen Activity Tolerance: Other (comment) (limited by increased anxiety with movement) Patient left: in chair;with call bell/phone within reach;with chair alarm set;with family/visitor present Nurse Communication: Mobility status PT Visit Diagnosis: Unsteadiness on feet (R26.81);Other abnormalities of gait and mobility (R26.89);Muscle weakness (generalized) (M62.81);Difficulty in walking, not elsewhere classified (R26.2);Other symptoms and signs involving the nervous system (R29.898);Hemiplegia and hemiparesis Hemiplegia - Right/Left: Right Hemiplegia - dominant/non-dominant: Dominant Hemiplegia - caused by: Cerebral infarction    Time: 8469-6295 PT Time Calculation (min) (ACUTE ONLY): 21 min   Charges:   PT Evaluation $PT Eval Moderate Complexity: 1 Mod   PT General Charges $$ ACUTE PT VISIT: 1 Visit         Bennett Ram B. Beverely Risen PT, DPT Acute Rehabilitation Services Please use secure chat or  Call Office [phone]   Elon Alas [facility] 04/23/2023, 11:55 AM

## 2023-04-23 NOTE — Progress Notes (Addendum)
STROKE TEAM PROGRESS NOTE   BRIEF HPI [name] is a 43 y.o. female with PMH significant for HTN, Seizures, Benign Brain Tumor, Addison's Disease, Pituatory Tumor, PCOS, Marijuana Use who presented to ED 1/20 d/t numbness to face, right arm and leg, right arm and leg weakness, right facial droop. Patient stated that symptoms started 1/19 and got progressively worse. Also endorsed right calf pain x 1 week.  MRI showed Left Pontine Infarct. CTA showed No LVO. No interventions d/t outside of window.   NIH on Admission: 6  INTERIM HISTORY/SUBJECTIVE  No acute events or neurological changes overnight. Boyfriend at bedside patient continues to have slurred speech with some intermittent aphasia, slight right facial droop, slight right arm ataxia with decreased sensation to right arm.  OBJECTIVE  CBC    Component Value Date/Time   WBC 5.1 04/23/2023 0510   RBC 4.20 04/23/2023 0510   HGB 13.0 04/23/2023 0510   HGB 12.0 06/12/2017 1203   HCT 38.7 04/23/2023 0510   HCT 36.2 06/12/2017 1203   PLT 207 04/23/2023 0510   PLT 204 06/12/2017 1203   MCV 92.1 04/23/2023 0510   MCV 92 06/12/2017 1203   MCH 31.0 04/23/2023 0510   MCHC 33.6 04/23/2023 0510   RDW 12.2 04/23/2023 0510   RDW 13.1 06/12/2017 1203   LYMPHSABS 2.3 04/22/2023 0752   MONOABS 0.6 04/22/2023 0752   EOSABS 0.1 04/22/2023 0752   BASOSABS 0.0 04/22/2023 0752    BMET    Component Value Date/Time   NA 136 04/23/2023 0510   NA 139 06/12/2017 1203   K 3.7 04/23/2023 0510   CL 106 04/23/2023 0510   CO2 21 (L) 04/23/2023 0510   GLUCOSE 92 04/23/2023 0510   BUN 9 04/23/2023 0510   BUN 5 (L) 06/12/2017 1203   CREATININE 0.85 04/23/2023 0510   CALCIUM 7.8 (L) 04/23/2023 0510   GFRNONAA >60 04/23/2023 0510    IMAGING past 24 hours Portable chest 1 View Result Date: 04/22/2023 CLINICAL DATA:  CVA EXAM: PORTABLE CHEST 1 VIEW COMPARISON:  03/16/2023 FINDINGS: No consolidation or effusion. Small nodular  focus of opacity at the right base suspect summation shadow or inflammatory given rapid appearance. Normal cardiomediastinal silhouette. No pneumothorax IMPRESSION: No active disease. Small nodular focus of opacity at the right base suspect summation shadow or inflammatory given rapid appearance, attention on two-view chest radiograph follow-up. Electronically Signed   By: Jasmine Pang M.D.   On: 04/22/2023 18:57   MR Brain W and Wo Contrast Result Date: 04/22/2023 CLINICAL DATA:  Headache, neuro deficit. EXAM: MRI HEAD WITHOUT AND WITH CONTRAST TECHNIQUE: Multiplanar, multiecho pulse sequences of the brain and surrounding structures were obtained without and with intravenous contrast. CONTRAST:  6mL GADAVIST GADOBUTROL 1 MMOL/ML IV SOLN COMPARISON:  Head and neck CTA 04/22/2023 FINDINGS: The study is moderately motion degraded. Brain: There is a 10 x 6 mm acute left pontine perforator infarct. No brain parenchymal signal abnormality is identified elsewhere within limitations of motion artifact. No intracranial hemorrhage, mass, midline shift, extra-axial fluid collection, or abnormal enhancement is identified. Cerebral volume is normal. The ventricles are normal in size. 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 Vascular: Major intracranial vascular flow voids are preserved. Skull and upper cervical spine: Diffusely diminished bone marrow T1 signal intensity, nonspecific though can be seen with anemia, smoking, and obesity. Sinuses/Orbits: Unremarkable orbits. Mild mucosal thickening in the paranasal sinuses. Clear mastoid air cells. Other: None. IMPRESSION: Acute left pontine infarct. Electronically Signed  By: Sebastian Ache M.D.   On: 04/22/2023 17:05    Vitals:   04/22/23 2145 04/22/23 2245 04/23/23 0511 04/23/23 0802  BP: (!) 149/99 (!) 166/113 (!) 159/104 (!) 139/98  Pulse: 77 87 71 78  Resp: 19 20 18 17   Temp:   98.1 F (36.7 C) 98.4 F (36.9 C)  TempSrc:   Oral Oral  SpO2: 100% 100% 100% 100%   Weight:      Height:         PHYSICAL EXAM General:  Alert, well-nourished, well-developed patient in no acute distress Psych:  Mood and affect appropriate for situation CV: Regular rate and rhythm on monitor Respiratory:  Regular, unlabored respirations on room air GI: Abdomen soft and nontender   NEURO:  Mental Status: AA&Ox3, patient is able to give clear and coherent history Speech/Language: mild dysarthria.  Naming, repetition, fluency, and comprehension intact.  Cranial Nerves:  II: PERRL. Visual fields full.  III, IV, VI: EOMI. Eyelids elevate symmetrically.  V: Sensation is intact to light touch and symmetrical to face.  VII: Face is symmetrical resting and smiling VIII: hearing intact to voice. IX, X: Palate elevates symmetrically. Phonation is normal.  UJ:[id number] shrug 5/5. XII: tongue is midline without fasciculations. 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  CTA head & neck: No LVO  MRI  Acute Left Pontine Infarct VAS [place] LE DVT: Negative 2D Echo EF 60 to 65% LDL 94 HgbA1c 5.0 Hypercoagulable workup pending UDS positive for THC VTE prophylaxis - lovenox No antithrombotic prior to admission, now on aspirin 81mg  and  clopidogrel 75 mg daily for 3 weeks then Asprin alone  Therapy recommendations:  CIR Disposition: Pending  Seizures Home meds: Keppra 500 mg every 8 hours, continued Reported frequency seizure at home.  Patient states she usually has 1-2 seizures a month but can have as many as 1-2 seizures a week. Patient states she gets aura before seizures, does not remember full episodes No seizure-like activity noted during admission Continue home Keppra  500 every 8 hours Patient stated that she follows with neurology locally but no documentation in chart Recommend to follow up with Dr. Karel Jar[facility] at [facility]  Hypertension No home meds Multiple ED visits note hypertension BP fluctuate during hospitalization On Norvasc 5 and Cozaar 25 Long-term BP goal: Normotensive  Hyperlipidemia Home meds:  none LDL 94, goal < 70 Follow Lipitor 40mg   Continue statin at discharge  Substance Abuse Patient uses Marijuana UDS positive for  THC  Home Meds: Methadone (due to pain and nausea from chronic pancreatitis)      Ready to quit? No TOC consult for cessation placed  Other Stroke Risk Factors On estrogen for PCOS  Other Active Problems Hx of Pituitary Tumor, has Follow-up scheduled with Neurosurgery Addison's disease on hydrocortisone History of PCOS On estrogen treatment for 3 years Off treatment for few months due to no doctor, recently placed back on treatments  Hospital day # 1  Pt seen by Neuro NP/APP and later by MD. Note/plan to be edited by MD as needed.    Lynnae January, DNP, AGACNP-BC [facility] Please use AMION for contact information & EPIC for messaging.  ATTENDING  NOTE: I reviewed above note and agree with the assessment and plan. Pt was seen and examined.   Husband at bedside.  Patient lying in bed, AOx3, no aphasia but mild dysarthria, follows simple commands.  Right facial droop, right upper extremity 4/5, right lower extremity 3+/5 with decreased right upper and lower extremity light touch sensation.  Finger-to-nose intact but slow on the right side.  Patient stroke more consistent with small vessel disease, risk factor including hypertension, hyperlipidemia, THC use and estrogen use.  Currently on Norvasc 5 and cozaar 25.  On DAPT and statin.  History of seizure and per patient and family seizure still frequent, on Keppra 500 every 8 hours.  She stated follow-up with local neurology but no documentation of  neurology in chart.  Recommend patient to discuss with her OB/GYN about estrogen use and current stroke.  Educated on [facility] cessation.  PT and OT recommend CIR.  For detailed assessment and plan, please refer to above/below as I have made changes wherever appropriate.   Neurology will sign off. Please call with questions. Pt will follow up with Dr. Karel Jarvis at [facility] in about 4 weeks. Thanks for the consult.   Marvel Plan, MD PhD Stroke Neurology 04/23/2023 10:05 PM     To contact Stroke Continuity provider, please refer to [email or web address]. After hours, contact General Neurology

## 2023-04-23 NOTE — Plan of Care (Signed)

## 2023-04-23 NOTE — Progress Notes (Signed)
Bilateral lower extremity venous duplex has been completed. Preliminary results can be found in CV Proc through chart review.   04/23/23 12:23 PM Olen Cordial RVT

## 2023-04-23 NOTE — Evaluation (Signed)
Occupational Therapy Evaluation Patient Details Name: Brittany Mullins MRN: [id number] DOB: [birth year] Today's Date: 04/23/2023   History of Present Illness 43 y.o. female who presented to ED 04/22/23 with right sided weakness, facial numbness and asymmetry. 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 PMHx Addison's disease, brain tumor (benign), Hypertension, Marijuana use, Pancreatitis, PCOS (polycystic ovarian syndrome), Pituitary tumor, and Seizures.   Clinical Impression   Pt ind at baseline with ADLs/functional mobility, lives with her boyfriend in a 3rd floor apartment. Pt currently needing up to max A for ADL, min +2 for bed mobility and mod +2 for transfers with RW. Pt very anxious regarding deficits and mobility, becoming tearful/labile during session. Pt with R UE/R LE deficits, provided pt with blue squeeze foam for R UE strengthening. Pt presenting with impairments listed below, will follow acutely. Patient will benefit from intensive inpatient follow up therapy, >3 hours/day to maximize safety/ind with ADL/functional mobility.       If plan is discharge home, recommend the following: A lot of help with walking and/or transfers;A lot of help with bathing/dressing/bathroom;Assistance with cooking/housework;Direct supervision/assist for medications management;Direct supervision/assist for financial management;Help with stairs or ramp for entrance;Assist for transportation;Supervision due to cognitive status    Functional Status Assessment  Patient has had a recent decline in their functional status and demonstrates the ability to make significant improvements in function in a reasonable and predictable amount of time.  Equipment Recommendations  Other (comment) (defer)    Recommendations for Other Services PT consult;Rehab consult     Precautions / Restrictions Precautions Precautions: Fall Restrictions Weight Bearing Restrictions Per Provider Order: No       Mobility Bed Mobility Overal bed mobility: Needs Assistance Bed Mobility: Supine to Sit     Supine to sit: +2 for physical assistance, Min assist     General bed mobility comments: minAx2 and maximal cuing for for managing LE off bed and bringing trunk to upright, pt very relieved when R knee bends for foot to reach the floor with sitting EoB    Transfers Overall transfer level: Needs assistance Equipment used: Rolling walker (2 wheels) Transfers: Sit to/from Stand, Bed to chair/wheelchair/BSC Sit to Stand: Mod assist, From elevated surface, +2 physical assistance     Step pivot transfers: Mod assist, +2 physical assistance, +2 safety/equipment            Balance Overall balance assessment: Needs assistance Sitting-balance support: Bilateral upper extremity supported, Feet supported Sitting balance-Leahy Scale: Fair     Standing balance support: Bilateral upper extremity supported, During functional activity, Reliant on assistive device for balance Standing balance-Leahy Scale: Poor                             ADL either performed or assessed with clinical judgement   ADL Overall ADL's : Needs assistance/impaired Eating/Feeding: Minimal assistance   Grooming: Minimal assistance   Upper Body Bathing: Moderate assistance   Lower Body Bathing: Maximal assistance   Upper Body Dressing : Moderate assistance   Lower Body Dressing: Maximal assistance   Toilet Transfer: Moderate assistance;+2 for physical assistance   Toileting- Clothing Manipulation and Hygiene: Moderate assistance       Functional mobility during ADLs: Moderate assistance;+2 for physical assistance;Rolling walker (2 wheels)       Vision Baseline Vision/History: 1 Wears glasses (for distance) Additional Comments: functional for basic mobility task, needs further assessment     Perception  Praxis         Pertinent Vitals/Pain Pain Assessment Pain Assessment:  Faces Pain Score: 2  Faces Pain Scale: Hurts a little bit Pain Location: generalized with movemen Pain Descriptors / Indicators: Grimacing, Guarding Pain Intervention(s): Limited activity within patient's tolerance, Monitored during session, Repositioned     Extremity/Trunk Assessment Upper Extremity Assessment Upper Extremity Assessment: Right hand dominant;RUE deficits/detail (reports can be ambidextrous) RUE Deficits / Details: 3/5 , distal vs proximal weakness, limitations in digit extension pt reports decr sensation throughout RUE Sensation: decreased light touch RUE Coordination: decreased fine motor;decreased gross motor   Lower Extremity Assessment Lower Extremity Assessment: Defer to PT evaluation RLE Deficits / Details: R knee in extension on entry, pt very concerned as she could not volitionally bend her knee, PT attempted to perform AAROM for knee flexion and met with increase resistance with PT's increased pressure, similar results with attempts for performing AAROM into R ankle dorsiflexion, however when pt moved to EoB her knee bent easily, pt then able to engage in AROM of knee flexion and extension with increased effort, RLE Sensation: decreased light touch RLE Coordination: decreased gross motor   Cervical / Trunk Assessment Cervical / Trunk Assessment: Normal   Communication Communication Communication: Difficulty communicating thoughts/reduced clarity of speech (slow speech, some difficulty with word finding and speaking)   Cognition Arousal: Alert Behavior During Therapy: WFL for tasks assessed/performed Overall Cognitive Status: Within Functional Limits for tasks assessed                                 General Comments: very anxious/preoccpied with deficits, overall decr awareness of safety during session, impulsively trying to stand up before cued or without assist/equipment. A & Ox4     General Comments  VSS on 2L O2, pt with incr anxiety  regarding current deficits and mobility    Exercises     Shoulder Instructions      Home Living Family/patient expects to be discharged to:: Private residence Living Arrangements: Spouse/significant other Available Help at Discharge: Family;Available PRN/intermittently (boyfriend is home at 4pm on weekdays, home on wknds) Type of Home: Apartment Home Access: Stairs to enter [employer] of Steps: 2 flights Entrance Stairs-Rails: Can reach both Home Layout: One level     Bathroom Shower/Tub: Tub/shower unit         Home Equipment: None      Lives With: Significant other    Prior Functioning/Environment Prior Level of Function : Independent/Modified Independent;Driving             Mobility Comments: ind ADLs Comments: ind with ADL/IADL, boyfriend reminds pt to takes medications        OT Problem List: Decreased strength;Decreased activity tolerance;Decreased range of motion;Impaired balance (sitting and/or standing);Decreased safety awareness;Decreased cognition;Impaired vision/perception;Impaired UE functional use;Impaired tone;Impaired sensation      OT Treatment/Interventions: Self-care/ADL training;Therapeutic exercise;Energy conservation;DME and/or AE instruction;Therapeutic activities;Patient/family education;Balance training;Cognitive remediation/compensation;Visual/perceptual remediation/compensation;Neuromuscular education    OT Goals(Current goals can be found in the care plan section) Acute Rehab OT Goals Patient Stated Goal: none stated OT Goal Formulation: With patient Time For Goal Achievement: 05/07/23 Potential to Achieve Goals: Good ADL Goals Pt Will Perform Upper Body Dressing: with contact guard assist;sitting Pt Will Perform Lower Body Dressing: with min assist;sitting/lateral leans;sit to/from stand Pt Will Transfer to Toilet: with min assist;ambulating;regular height toilet Pt/caregiver will Perform Home Exercise Program: Right  Upper extremity;With Supervision;With written HEP provided  OT Frequency: Min 1X/week    Co-evaluation PT/OT/SLP Co-Evaluation/Treatment: Yes Reason for Co-Treatment: Complexity of the patient's impairments (multi-system involvement);For patient/therapist safety PT goals addressed during session: Mobility/safety with mobility OT goals addressed during session: ADL's and self-care;Strengthening/ROM      AM-PAC OT "6 Clicks" Daily Activity     Outcome Measure Help from another person eating meals?: A Little Help from another person taking care of personal grooming?: A Little Help from another person toileting, which includes using toliet, bedpan, or urinal?: A Lot Help from another person bathing (including washing, rinsing, drying)?: A Lot Help from another person to put on and taking off regular upper body clothing?: A Little Help from another person to put on and taking off regular lower body clothing?: A Lot 6 Click Score: 15   End of Session Equipment Utilized During Treatment: Rolling walker (2 wheels);Gait belt;Oxygen (2L) Nurse Communication: Mobility status  Activity Tolerance: Patient tolerated treatment well Patient left: with call bell/phone within reach;in chair;with chair alarm set;with family/visitor present  OT Visit Diagnosis: Unsteadiness on feet (R26.81);Other abnormalities of gait and mobility (R26.89);Muscle weakness (generalized) (M62.81)                Time: 4742-5956 OT Time Calculation (min): 33 min Charges:  OT General Charges $OT Visit: 1 Visit OT Evaluation $OT Eval Moderate Complexity: 1 Mod  Kouper Spinella K, OTD, OTR/L SecureChat Preferred Acute Rehab [phone]   Carver Fila Koonce 04/23/2023, 12:46 PM

## 2023-04-24 DIAGNOSIS — R112 Nausea with vomiting, unspecified: Secondary | ICD-10-CM | POA: Diagnosis not present

## 2023-04-24 DIAGNOSIS — R531 Weakness: Secondary | ICD-10-CM | POA: Diagnosis not present

## 2023-04-24 DIAGNOSIS — I639 Cerebral infarction, unspecified: Secondary | ICD-10-CM | POA: Diagnosis not present

## 2023-04-24 LAB — BETA-2-GLYCOPROTEIN I ABS, IGG/M/A
Beta-2 Glyco I IgG: <9 GPI IgG units (ref 0–20)
Beta-2-Glycoprotein I IgA: <9 GPI IgA units (ref 0–25)
Beta-2-Glycoprotein I IgM: <9 GPI IgM units (ref 0–32)

## 2023-04-24 LAB — CARDIOLIPIN ANTIBODIES, IGG, IGM, IGA
Anticardiolipin IgA: <9 [APL'U]/mL (ref 0–11)
Anticardiolipin IgG: <9 [GPL'U]/mL (ref 0–14)
Anticardiolipin IgM: <9 [MPL'U]/mL (ref 0–12)

## 2023-04-24 LAB — LUPUS ANTICOAGULANT PANEL
DRVVT: 39.9 s (ref 0.0–47.0)
PTT Lupus Anticoagulant: 40.2 s (ref 0.0–43.5)

## 2023-04-24 LAB — PROTEIN C ACTIVITY: Protein C Activity: 124 % (ref 73–180)

## 2023-04-24 LAB — PROTEIN C, TOTAL: Protein C, Total: 117 % (ref 60–150)

## 2023-04-24 LAB — PROTEIN S, TOTAL: Protein S Ag, Total: 48 % — ABNORMAL LOW (ref 60–150)

## 2023-04-24 LAB — PROTEIN S ACTIVITY: Protein S Activity: 68 % (ref 63–140)

## 2023-04-24 LAB — HOMOCYSTEINE: Homocysteine: 8.2 umol/L (ref 0.0–14.5)

## 2023-04-24 MED ORDER — SODIUM CHLORIDE 0.9 % IV SOLN
12.5000 mg | Freq: Four times a day (QID) | INTRAVENOUS | Status: DC | PRN
  Administered 2023-04-24 – 2023-04-27 (×2): 12.5 mg via INTRAVENOUS
  Filled 2023-04-24 (×3): qty 0.5

## 2023-04-24 NOTE — TOC Initial Note (Signed)
Transition of Care [facility]) - Initial/Assessment Note    Patient Details  Name: Brittany Mullins MRN: [id number] Date of Birth: [birth year]  [facility]) CM/SW Contact:    Kermit Balo, RN Phone Number: 04/24/2023, 11:37 AM  Clinical Narrative:                  Pt is from home with her boyfriend. Her boyfriend works. She also has an adult son that works. She states she doesn't have anyone else that could assist at home when boyfriend is at work. No DME at home. She manages her own medications and denies any issues.  She denies issues with transportation. Awaiting CIR eval.  TOC following.   Expected Discharge Plan: IP Rehab Facility Barriers to Discharge: Continued Medical Work up   Patient Goals and CMS Choice   CMS Medicare.gov Compare Post Acute Care list provided to:: Patient Choice offered to / list presented to : Patient (boyfriend/ son)      Expected Discharge Plan and Services   Discharge Planning Services: CM Consult Post Acute Care Choice: IP Rehab Living arrangements for the past 2 months: Apartment                                      Prior Living Arrangements/Services Living arrangements for the past 2 months: Apartment Lives with:: Significant Other Patient language and need for interpreter reviewed:: Yes Do you feel safe going back to the place where you live?: Yes            Criminal Activity/Legal Involvement Pertinent to Current Situation/Hospitalization: No - Comment as needed  Activities of Daily Living   ADL Screening (condition at time of admission) Independently performs ADLs?: No Does the patient have a NEW difficulty with bathing/dressing/toileting/self-feeding that is expected to last >3 days?: Yes (Initiates electronic notice to provider for possible OT consult) (srtoke workup) Does the patient have a NEW difficulty with getting in/out of bed, walking, or climbing stairs that is expected to last >3 days?:  Yes (Initiates electronic notice to provider for possible PT consult) (stroke workup) Does the patient have a NEW difficulty with communication that is expected to last >3 days?: No Is the patient deaf or have difficulty hearing?: No Does the patient have difficulty seeing, even when wearing glasses/contacts?: No Does the patient have difficulty concentrating, remembering, or making decisions?: No  Permission Sought/Granted                  Emotional Assessment Appearance:: Appears stated age Attitude/Demeanor/Rapport: Engaged Affect (typically observed): Accepting Orientation: : Oriented to Self, Oriented to Place, Oriented to  Time, Oriented to Situation   Psych Involvement: No (comment)  Admission diagnosis:  Stroke (cerebrum) (HCC) [I63.9] Acute right-sided weakness [R53.1] Cerebrovascular accident (CVA), unspecified mechanism (HCC) [I63.9] Nausea and vomiting, unspecified vomiting type [R11.2] Patient Active Problem List   Diagnosis Date Noted   HTN (hypertension) 04/23/2023   Stroke (cerebrum) (HCC) 04/22/2023   Hyperkalemia 04/22/2023   Dizziness 04/22/2023   Acute right-sided weakness 04/22/2023   Acute intractable headache 04/22/2023   Intractable vomiting with nausea 08/17/2022   Abdominal pain, epigastric    Gastritis and gastroduodenitis    AKI (acute kidney injury) (HCC) 08/15/2021   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   Intractable nausea and vomiting 10/25/2019   History of Addison's disease    Intractable cyclical vomiting with nausea  Opioid dependence (HCC) 06/03/2017   Hypokalemia 06/03/2017   Dehydration 06/03/2017   Generalized abdominal pain    Hypomagnesemia    Marijuana use    Chronic pancreatitis (HCC) 01/21/2014   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   Bipolar disorder (HCC) 07/07/2012   PCP:  Patient, No Pcp Per Pharmacy:   [id number] [place], [place] - [address] AT [facility] OF [address] & [facility] [address] [place] [address] Phone: [phone] Fax: [phone]     Social Drivers of Health (SDOH) Social History: SDOH Screenings   Food Insecurity: No Food Insecurity (04/22/2023)  Housing: High Risk (04/22/2023)  Transportation Needs: No Transportation Needs (04/22/2023)  Utilities: Not At Risk (04/22/2023)  Tobacco Use: Low Risk  (04/23/2023)   SDOH Interventions:     Readmission Risk Interventions     No data to display

## 2023-04-24 NOTE — Progress Notes (Signed)
  Inpatient Rehabilitation Admissions Coordinator   Met with patient at bedside for rehab assessment and spoke with her Boyfriend, Durene Cal, by phone.  We discussed goals and expectations of a possible CIR admit. They prefer CIR for rehab. [name] to speak to friends and family to clarify if he can arrange caregiver supports when he works. If that can be arranged, she could be a CIR candidate. If not, other rehab venues would have to be pursued. I do not feel she can reach Mod I level to return home alone when [place] works. Please call me with any questions.   Ottie Glazier, RN, MSN Rehab Admissions Coordinator [phone]

## 2023-04-24 NOTE — Progress Notes (Signed)
Occupational Therapy Treatment Patient Details Name: Brittany Mullins MRN: [id number] DOB: 05-30-1980 Today's Date: 04/24/2023   History of present illness 43 y.o. female who presented to ED 04/22/23 with right sided weakness, facial numbness and asymmetry. 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 PMHx Addison's disease, brain tumor (benign), Hypertension, Marijuana use, Pancreatitis, PCOS (polycystic ovarian syndrome), Pituitary tumor, and Seizures.   OT comments  Pt progressing towards goals this session, demonstrates incr strength and ROM in RUE and RLE. Pt transferring from bed  <> chair with minA and 1 person HHA. Pt CGA for bed mobility. Recliner chair rolled to sink and pt able to stand for oral care task at sink using BUE, min A for standing balance with unilateral UE support on sink counter. Pt able to stand x2 min before needing seated rest break. Able to brush hair using RUE and LUE (LUE used for thoroughness). Pt presenting with impairments listed below, will follow acutely. Patient will benefit from intensive inpatient follow up therapy, >3 hours/day to maximize safety/ind with ADL/functional mobility.       If plan is discharge home, recommend the following:  A lot of help with walking and/or transfers;A lot of help with bathing/dressing/bathroom;Assistance with cooking/housework;Direct supervision/assist for medications management;Direct supervision/assist for financial management;Help with stairs or ramp for entrance;Assist for transportation;Supervision due to cognitive status   Equipment Recommendations  Other (comment) (defer)    Recommendations for Other Services PT consult;Rehab consult    Precautions / Restrictions Precautions Precautions: Fall Restrictions Weight Bearing Restrictions Per Provider Order: No       Mobility Bed Mobility Overal bed mobility: Needs Assistance Bed Mobility: Supine to Sit     Supine to sit: Contact guard           Transfers Overall transfer level: Needs assistance Equipment used: 1 person hand held assist Transfers: Sit to/from Stand, Bed to chair/wheelchair/BSC Sit to Stand: Min assist     Step pivot transfers: Min assist     General transfer comment: min A pivot transfer from bed  <> recliner, able to stand holding onto sink/countertop for grooming task     Balance Overall balance assessment: Needs assistance Sitting-balance support: Bilateral upper extremity supported, Feet supported Sitting balance-Leahy Scale: Fair Sitting balance - Comments: frequent moving when seated EOB needing CGA for safety'   Standing balance support: Bilateral upper extremity supported, During functional activity, Reliant on assistive device for balance Standing balance-Leahy Scale: Poor Standing balance comment: reliant on external support                           ADL either performed or assessed with clinical judgement   ADL Overall ADL's : Needs assistance/impaired     Grooming: Oral care;Brushing hair;Contact guard assist;Sitting;Standing Grooming Details (indicate cue type and reason): stands at sink for oral care task, needs seated rest break and is able to perform seated hair brushing task with LUE for thoroughness, but able to perform with RUE for ~1 min             Lower Body Dressing: Moderate assistance Lower Body Dressing Details (indicate cue type and reason): reaches down to pull sock up on R foot             Functional mobility during ADLs: Minimal assistance      Extremity/Trunk Assessment Upper Extremity Assessment Upper Extremity Assessment: Right hand dominant RUE Deficits / Details: 3+/5 distal vs proximal weakness, can composite flex/ext digits  and can sustain grasp for ~1 min during functional task RUE Sensation: decreased light touch RUE Coordination: decreased fine motor;decreased gross motor   Lower Extremity Assessment Lower Extremity Assessment:  Defer to PT evaluation        Vision   Vision Assessment?: Yes Eye Alignment: Within Functional Limits Ocular Range of Motion: Within Functional Limits Alignment/Gaze Preference: Within Defined Limits Tracking/Visual Pursuits: Other (comment) (reports incr blurriness when looking to R) Visual Fields: Impaired-to be further tested in functional context Additional Comments: pt reports blurred vision in R eye when L eye occluded, vision in L eye less blurred with R eye occluded. Pt squinting L eye frequently during session   Perception Perception Perception: Not tested   Praxis Praxis Praxis: Not tested    Cognition Arousal: Alert Behavior During Therapy: WFL for tasks assessed/performed, Anxious Overall Cognitive Status: Within Functional Limits for tasks assessed                                 General Comments: anxious with nausea and incr anxiety regarding deficits, at times moving impulsively during session        Exercises      Shoulder Instructions       General Comments VSS on RA    Pertinent Vitals/ Pain       Pain Assessment Pain Assessment: No/denies pain  Home Living                                          Prior Functioning/Environment              Frequency  Min 1X/week        Progress Toward Goals  OT Goals(current goals can now be found in the care plan section)  Progress towards OT goals: Progressing toward goals  Acute Rehab OT Goals Patient Stated Goal: none stated OT Goal Formulation: With patient Time For Goal Achievement: 05/07/23 Potential to Achieve Goals: Good ADL Goals Pt Will Perform Upper Body Dressing: with contact guard assist;sitting Pt Will Perform Lower Body Dressing: with min assist;sitting/lateral leans;sit to/from stand Pt Will Transfer to Toilet: with min assist;ambulating;regular height toilet Pt/caregiver will Perform Home Exercise Program: Right Upper extremity;With  Supervision;With written HEP provided  Plan      Co-evaluation                 AM-PAC OT "6 Clicks" Daily Activity     Outcome Measure   Help from another person eating meals?: A Little Help from another person taking care of personal grooming?: A Little Help from another person toileting, which includes using toliet, bedpan, or urinal?: A Lot Help from another person bathing (including washing, rinsing, drying)?: A Lot Help from another person to put on and taking off regular upper body clothing?: A Little Help from another person to put on and taking off regular lower body clothing?: A Lot 6 Click Score: 15    End of Session Equipment Utilized During Treatment: Gait belt  OT Visit Diagnosis: Unsteadiness on feet (R26.81);Other abnormalities of gait and mobility (R26.89);Muscle weakness (generalized) (M62.81)   Activity Tolerance Patient tolerated treatment well   Patient Left in bed;with call bell/phone within reach;with bed alarm set   Nurse Communication Mobility status        Time: 2440-1027 OT Time Calculation (min): 28 min  Charges: OT General Charges $OT Visit: 1 Visit OT Treatments $Self Care/Home Management : 8-22 mins $Therapeutic Activity: 8-22 mins  Carver Fila, OTD, OTR/L SecureChat Preferred Acute Rehab [phone]   Carver Fila Koonce 04/24/2023, 1:22 PM

## 2023-04-24 NOTE — Progress Notes (Signed)
Progress Note   Patient: Brittany Mullins [facility] ZDG:[id number] DOB: [birth year] DOA: 04/22/2023     2 DOS: the patient was seen and examined on 04/24/2023   Brief hospital course: 43yo history of Addison's disease, benign brain tumor, hypertension, history of pancreatitis, PCOS, pituitary tumor, seizures, opiate dependence on methadone, bipolar disorder, THC use presented to the ED with right-sided weakness and numbness, right facial numbness with some dysarthric speech. CT angiogram head and neck negative for LVO. MRI consistent with a acute left pontine infarct. Patient admitted for stroke workup. Neurology consulted.   Assessment and Plan: #1 acute left pontine infarct -Patient presented with right-sided upper extremity weakness and numbness with right facial weakness as well as right facial numbness which started at 12 PM the day prior to admission.  Patient not a tPA candidate due to delayed presentation. -CT angiogram head and neck done with no acute abnormality noted, no LVO, no evidence of significant stenosis in the neck. -MRI brain done positive for acute left pontine infarct. -2D echo with normal LVEF, no atrial level shunts -Lower extremity Dopplers neg for DVT -Placed on Norvasc 5 mg daily, Cozaar 25 mg daily for BP control and uptitrate as needed for better blood pressure control.  -LDL obtained at 97 and as such patient started on Lipitor 40 mg daily with goal LDL < 70. -Hypercoagulable panel ordered by neurology. -Neurology consulted. Recs for ASA 81mg  and plavix 75mg  daily x 3 weeks, then ASA alone   2.  History of adrenal insufficiency/Addison's disease -Patient noted with a history of Addison's disease and per patient and boyfriend used to be on prednisone in the past however did not like the way it made her feel and she subsequently discontinued taking it. -Patient states she was in the process of establishing with a PCP in order to get a referral to endocrinology in the  outpatient setting. -Patient over the past month has been having intermittent symptoms of nausea vomiting lightheadedness and dizziness and just not feeling well. -Patient noted not to be orthostatic initially however had received fluid boluses in the ED prior to orthostatics being checked.   -Initial potassium on admission was elevated.   -Patient received a bolus of normal saline currently on IV fluids.  -Patient started on hydrocortisone 10 mg every morning and 5 mg with supper.   -Supportive care.   -Will need outpatient follow-up with endocrinology.    3.  Hypokalemia -Repleted.    4.  Dehydration -given IV fluids.   5.  History of gastritis/duodenitis/history of peptic ulcer disease per patient -Transition from IV PPI to oral PPI daily.     6.  Opioid dependence -Resumed home regimen methadone.   7.  Headache -Patient given a dose of IV Compazine 5 mg followed by morphine 2 mg IV times with some improvement with headache yesterday however still with ongoing headache.   -IV Compazine as needed, IV morphine as needed, will also defer to neurology.     8.  History of seizures -Continue home regimen Keppra.   9.  Hypertension -Started Norvasc 5 mg daily, Cozaar 25 mg daily and uptitrate as needed for better blood pressure control. -BP stable       Subjective: Eager to go to rehab soon  Physical Exam: Vitals:   04/23/23 2323 04/24/23 0304 04/24/23 0818 04/24/23 1058  BP: (!) 140/99 (!) 186/127 (!) 157/92 (!) 154/107  Pulse: 73 65 72 74  Resp: 18 18 19 17   Temp: 98.5 F (36.9 C)  98.6 F (37 C) 97.9 F (36.6 C) 97.7 F (36.5 C)  TempSrc: Oral Oral Oral Oral  SpO2: 100% 99% 96% 99%  Weight:      Height:       General exam: Awake, laying in bed, in nad Respiratory system: Normal respiratory effort, no wheezing Cardiovascular system: regular rate, s1, s2 Gastrointestinal system: Soft, nondistended, positive BS Central nervous system: CN2-12 grossly intact, no  tremors Extremities: Perfused, no clubbing Skin: Normal skin turgor, no notable skin lesions seen Psychiatry: Mood normal // no visual hallucinations   Data Reviewed:  There are no new results to review at this time.  Family Communication: Pt in room, family not at bedside  Disposition: Status is: Inpatient Remains inpatient appropriate because: severity of illness  Planned Discharge Destination: Rehab    Author: Rickey Barbara, MD 04/24/2023 3:44 PM  For on call review [email or web address].

## 2023-04-24 NOTE — Hospital Course (Signed)
42yo history of Addison's disease, benign brain tumor, hypertension, history of pancreatitis, PCOS, pituitary tumor, seizures, opiate dependence on methadone, bipolar disorder, THC use presented to the ED with right-sided weakness and numbness, right facial numbness with some dysarthric speech. CT angiogram head and neck negative for LVO. MRI consistent with a acute left pontine infarct. Patient admitted for stroke workup. Neurology consulted.

## 2023-04-24 NOTE — Progress Notes (Signed)
TRH night cross cover note:   I was notified by RN that the patient is complaining of nausea, but that she also conveys that, historically, Zofran has been ineffective for her.  I subsequently discontinued the existing order for as needed Zofran and added an order for prn IV Phenergan for nausea/vomiting.     Newton Pigg, DO Hospitalist

## 2023-04-24 NOTE — Plan of Care (Signed)

## 2023-04-25 ENCOUNTER — Inpatient Hospital Stay (HOSPITAL_COMMUNITY): Payer: Self-pay | Admitting: Anesthesiology

## 2023-04-25 ENCOUNTER — Ambulatory Visit (HOSPITAL_COMMUNITY)
Admission: RE | Admit: 2023-04-25 | Discharge: 2023-04-25 | Disposition: A | Discharge status: Home / Self Care | Payer: Medicare Other | Source: Ambulatory Visit | Attending: Pain Medicine | Admitting: Pain Medicine

## 2023-04-25 ENCOUNTER — Ambulatory Visit (HOSPITAL_COMMUNITY): Admit: 2023-04-25 | Payer: Medicare Other

## 2023-04-25 ENCOUNTER — Encounter (HOSPITAL_COMMUNITY): Payer: Self-pay | Admitting: Internal Medicine

## 2023-04-25 ENCOUNTER — Encounter (HOSPITAL_COMMUNITY)
Admission: EM | Disposition: A | Discharge status: Still In Hospital | Payer: Self-pay | Source: Home / Self Care | Attending: Internal Medicine

## 2023-04-25 DIAGNOSIS — I639 Cerebral infarction, unspecified: Secondary | ICD-10-CM | POA: Diagnosis not present

## 2023-04-25 DIAGNOSIS — I10 Essential (primary) hypertension: Secondary | ICD-10-CM

## 2023-04-25 DIAGNOSIS — R569 Unspecified convulsions: Secondary | ICD-10-CM | POA: Insufficient documentation

## 2023-04-25 DIAGNOSIS — D497 Neoplasm of unspecified behavior of endocrine glands and other parts of nervous system: Secondary | ICD-10-CM | POA: Diagnosis present

## 2023-04-25 DIAGNOSIS — R531 Weakness: Secondary | ICD-10-CM | POA: Diagnosis not present

## 2023-04-25 HISTORY — PX: RADIOLOGY WITH ANESTHESIA: SHX6223

## 2023-04-25 SURGERY — MRI WITH ANESTHESIA
Anesthesia: General

## 2023-04-25 MED ORDER — LIDOCAINE 2% (20 MG/ML) 5 ML SYRINGE
INTRAMUSCULAR | Status: DC | PRN
  Administered 2023-04-25: 100 mg via INTRAVENOUS

## 2023-04-25 MED ORDER — PROPOFOL 10 MG/ML IV BOLUS
INTRAVENOUS | Status: DC | PRN
  Administered 2023-04-25: 180 mg via INTRAVENOUS

## 2023-04-25 MED ORDER — DEXAMETHASONE SODIUM PHOSPHATE 10 MG/ML IJ SOLN
INTRAMUSCULAR | Status: DC | PRN
  Administered 2023-04-25: 4 mg via INTRAVENOUS

## 2023-04-25 MED ORDER — LACTATED RINGERS IV SOLN: INTRAVENOUS | Status: DC

## 2023-04-25 MED ORDER — ACETAMINOPHEN 500 MG PO TABS
ORAL_TABLET | ORAL | Status: AC
  Filled 2023-04-25: qty 2

## 2023-04-25 MED ORDER — ACETAMINOPHEN 500 MG PO TABS
1000.0000 mg | ORAL_TABLET | Freq: Once | ORAL | Status: AC
  Administered 2023-04-25: 1000 mg via ORAL
  Filled 2023-04-25: qty 2

## 2023-04-25 MED ORDER — CHLORHEXIDINE GLUCONATE 0.12 % MT SOLN: 15.0000 mL | Freq: Once | OROMUCOSAL | Status: AC

## 2023-04-25 MED ORDER — GADOBUTROL 1 MMOL/ML IV SOLN
6.0000 mL | Freq: Once | INTRAVENOUS | Status: AC | PRN
  Administered 2023-04-25: 6 mL via INTRAVENOUS

## 2023-04-25 MED ORDER — AMISULPRIDE (ANTIEMETIC) 5 MG/2ML IV SOLN
10.0000 mg | Freq: Once | INTRAVENOUS | Status: AC
  Administered 2023-04-25: 10 mg via INTRAVENOUS

## 2023-04-25 MED ORDER — AMISULPRIDE (ANTIEMETIC) 5 MG/2ML IV SOLN
INTRAVENOUS | Status: AC
  Filled 2023-04-25: qty 4

## 2023-04-25 MED ORDER — CHLORHEXIDINE GLUCONATE 0.12 % MT SOLN
OROMUCOSAL | Status: AC
  Administered 2023-04-25: 15 mL via OROMUCOSAL
  Filled 2023-04-25: qty 15

## 2023-04-25 MED ORDER — ONDANSETRON HCL 4 MG/2ML IJ SOLN
INTRAMUSCULAR | Status: DC | PRN
  Administered 2023-04-25: 4 mg via INTRAVENOUS

## 2023-04-25 MED ORDER — ORAL CARE MOUTH RINSE: 15.0000 mL | Freq: Once | OROMUCOSAL | Status: AC

## 2023-04-25 MED ORDER — MORPHINE SULFATE (PF) 2 MG/ML IV SOLN
4.0000 mg | Freq: Once | INTRAVENOUS | Status: AC | PRN
  Administered 2023-04-25: 4 mg via INTRAVENOUS
  Filled 2023-04-25: qty 2

## 2023-04-25 NOTE — Progress Notes (Signed)
Patient will be seen later because I was not aware that the patient had been transported off the floor for an outpatient MRI with sedation that was ordered in the fall of 2024. This MRI was not per recommendation of inpt Neuro or myself during this hospital course.

## 2023-04-25 NOTE — Progress Notes (Signed)
Physical Therapy Treatment Patient Details Name: Brittany Mullins MRN: [id number] DOB: 1980/07/04 Today's Date: 04/25/2023   History of Present Illness 43 y.o. female who presented to ED 04/22/23 with right sided weakness, facial numbness and asymmetry. 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 PMHx Addison's disease, brain tumor (benign), Hypertension, Marijuana use, Pancreatitis, PCOS (polycystic ovarian syndrome), Pituitary tumor, and Seizures.    PT Comments  Pt resting in bed on arrival and eager for OOB mobility. Pt with great progress towards acute goals , able to progress gait with up to mod A to steady with RW for support and chair follow for safety in hall ~30'. Pt  able to come to sitting EOB with Cga for safety and complete transfers with grossly min A to boost and maintain balance on rise and to step to recliner. Pt with some impulsivity needing cues to move more slowly with all mobility and wait for assist for safety. Pt is motivated and will benefit from intensive inpatient follow up therapy, >3 hours/day to address deficits and maximize functional independence. Pt continues to benefit from skilled PT services to progress toward functional mobility goals.      If plan is discharge home, recommend the following: A lot of help with walking and/or transfers;A lot of help with bathing/dressing/bathroom;Assistance with cooking/housework;Assistance with feeding;Direct supervision/assist for medications management;Direct supervision/assist for financial management;Assist for transportation;Help with stairs or ramp for entrance;Supervision due to cognitive status   Can travel by private vehicle        Equipment Recommendations  Rolling walker (2 wheels);BSC/3in1    Recommendations for Other Services       Precautions / Restrictions Precautions Precautions: Fall Restrictions Weight Bearing Restrictions Per Provider Order: No     Mobility  Bed Mobility Overal bed  mobility: Needs Assistance Bed Mobility: Supine to Sit     Supine to sit: Contact guard     General bed mobility comments: CGA for safety    Transfers Overall transfer level: Needs assistance Equipment used: Rolling walker (2 wheels) Transfers: Sit to/from Stand, Bed to chair/wheelchair/BSC Sit to Stand: Min assist   Step pivot transfers: Min assist       General transfer comment: min A to boost to stand from EOB, heavier min A to boost from reclienr, min A pivot transfer from bed  <> recliner    Ambulation/Gait Ambulation/Gait assistance: Mod assist, +2 safety/equipment (chair follow) Gait Distance (Feet): 30 Feet Assistive device: Rolling walker (2 wheels) Gait Pattern/deviations: Step-to pattern, Decreased stance time - right, Decreased step length - right, Decreased dorsiflexion - right, Decreased weight shift to right, Ataxic Gait velocity: decr     General Gait Details: pt with some flexion synergy during swing phase on R with increased hip/trunk felxion needing mod A to maintain upright trunk, cues for wider BOS as pt with RLE adduction during swing phase as well, chair follow for safety   Stairs             Wheelchair Mobility     Tilt Bed    Modified Rankin (Stroke Patients Only) Modified Rankin (Stroke Patients Only) Pre-Morbid Rankin Score: No symptoms Modified Rankin: Moderately severe disability     Balance Overall balance assessment: Needs assistance Sitting-balance support: Bilateral upper extremity supported, Feet supported Sitting balance-Leahy Scale: Fair Sitting balance - Comments: frequent moving when seated EOB needing CGA for safety'   Standing balance support: Bilateral upper extremity supported, During functional activity, Reliant on assistive device for balance Standing balance-Leahy Scale: Poor Standing  balance comment: reliant on external support                            Cognition Arousal: Alert Behavior During  Therapy: WFL for tasks assessed/performed, Anxious Overall Cognitive Status: Within Functional Limits for tasks assessed                                 General Comments: at times moving impulsively during session, cues to slow and wait for thie PTA, and cues to move more slowly generally with all OOB mobility        Exercises General Exercises - Lower Extremity Hip Flexion/Marching: AROM, Right, Left, 10 reps, Standing    General Comments General comments (skin integrity, edema, etc.): VSS on RA      Pertinent Vitals/Pain Pain Assessment Pain Assessment: No/denies pain Pain Intervention(s): Monitored during session    Home Living                          Prior Function            PT Goals (current goals can now be found in the care plan section) Acute Rehab PT Goals Patient Stated Goal: get mobility back PT Goal Formulation: With patient/family Time For Goal Achievement: 05/07/23 Progress towards PT goals: Progressing toward goals    Frequency    Min 1X/week      PT Plan      Co-evaluation              AM-PAC PT "6 Clicks" Mobility   Outcome Measure  Help needed turning from your back to your side while in a flat bed without using bedrails?: A Little Help needed moving from lying on your back to sitting on the side of a flat bed without using bedrails?: A Lot Help needed moving to and from a bed to a chair (including a wheelchair)?: A Lot Help needed standing up from a chair using your arms (e.g., wheelchair or bedside chair)?: A Little Help needed to walk in hospital room?: Total Help needed climbing 3-5 steps with a railing? : Total 6 Click Score: 12    End of Session Equipment Utilized During Treatment: Gait belt Activity Tolerance: Patient tolerated treatment well Patient left: in chair;with call bell/phone within reach;with family/visitor present Nurse Communication: Mobility status PT Visit Diagnosis: Unsteadiness on  feet (R26.81);Other abnormalities of gait and mobility (R26.89);Muscle weakness (generalized) (M62.81);Difficulty in walking, not elsewhere classified (R26.2);Other symptoms and signs involving the nervous system (R29.898);Hemiplegia and hemiparesis Hemiplegia - Right/Left: Right Hemiplegia - dominant/non-dominant: Dominant Hemiplegia - caused by: Cerebral infarction     Time: 8469-6295 PT Time Calculation (min) (ACUTE ONLY): 16 min  Charges:    $Gait Training: 8-22 mins PT General Charges $$ ACUTE PT VISIT: 1 Visit                     Jimmi Sidener R. PTA Acute Rehabilitation Services Office: [phone]   Catalina Antigua 04/25/2023, 4:53 PM

## 2023-04-25 NOTE — Anesthesia Preprocedure Evaluation (Addendum)
Anesthesia Evaluation  Patient identified by MRN, date of birth, ID band Patient awake    Reviewed: Allergy & Precautions, NPO status , Patient's Chart, lab work & pertinent test results  Airway Mallampati: III  TM Distance: >3 FB Neck ROM: Full    Dental  (+) Dental Advisory Given, Poor Dentition, Edentulous Upper,    Pulmonary neg pulmonary ROS   breath sounds clear to auscultation       Cardiovascular hypertension,  Rhythm:Regular Rate:Tachycardia     Neuro/Psych Pt denies h/o seizure  negative psych ROS   GI/Hepatic negative GI ROS, Neg liver ROS,,,  Endo/Other  negative endocrine ROS    Renal/GU Renal disease     Musculoskeletal negative musculoskeletal ROS (+)    Abdominal Normal abdominal exam  (+)   Peds  Hematology negative hematology ROS (+)   Anesthesia Other Findings   Reproductive/Obstetrics negative OB ROS                             Anesthesia Physical Anesthesia Plan  ASA: 3  Anesthesia Plan: MAC   Post-op Pain Management:    Induction: Intravenous  PONV Risk Score and Plan: 0 and Propofol infusion  Airway Management Planned: Natural Airway and Simple Face Mask  Additional Equipment: None  Intra-op Plan:   Post-operative Plan:   Informed Consent: I have reviewed the patients History and Physical, chart, labs and discussed the procedure including the risks, benefits and alternatives for the proposed anesthesia with the patient or authorized representative who has indicated his/her understanding and acceptance.       Plan Discussed with: CRNA and Anesthesiologist  Anesthesia Plan Comments:         Anesthesia Quick Evaluation

## 2023-04-25 NOTE — Anesthesia Postprocedure Evaluation (Signed)
Anesthesia Post Note  Patient: Brittany Mullins  Procedure(s) Performed: MRI of Brain without contrast     Patient location during evaluation: PACU Anesthesia Type: General Level of consciousness: awake and alert Pain management: pain level controlled Vital Signs Assessment: post-procedure vital signs reviewed and stable Respiratory status: spontaneous breathing, nonlabored ventilation and respiratory function stable Cardiovascular status: blood pressure returned to baseline and stable Postop Assessment: no apparent nausea or vomiting Anesthetic complications: no   No notable events documented.  Last Vitals:    Last Pain:                 Collene Schlichter

## 2023-04-25 NOTE — Progress Notes (Signed)
Progress Note   Patient: Brittany Mullins [facility] ZOX:[id number] DOB: Feb 23, 1981 DOA: 04/22/2023     3 DOS: the patient was seen and examined on 04/25/2023   Brief hospital course: 42yo history of Addison's disease, benign brain tumor, hypertension, history of pancreatitis, PCOS, pituitary tumor, seizures, opiate dependence on methadone, bipolar disorder, THC use presented to the ED with right-sided weakness and numbness, right facial numbness with some dysarthric speech. CT angiogram head and neck negative for LVO. MRI consistent with a acute left pontine infarct. Patient admitted for stroke workup. Neurology consulted.   Assessment and Plan: #1 acute left pontine infarct -Patient presented with right-sided upper extremity weakness and numbness with right facial weakness as well as right facial numbness which started at 12 PM the day prior to admission.  Patient not a tPA candidate due to delayed presentation. -CT angiogram head and neck done with no acute abnormality noted, no LVO, no evidence of significant stenosis in the neck. -MRI brain done positive for acute left pontine infarct. -2D echo with normal LVEF, no atrial level shunts -Lower extremity Dopplers neg for DVT -Placed on Norvasc 5 mg daily, Cozaar 25 mg daily for BP control and uptitrate as needed for better blood pressure control.  -LDL obtained at 97 and as such patient started on Lipitor 40 mg daily with goal LDL < 70. -Hypercoagulable panel ordered by neurology. -Neurology consulted. Recs for ASA 81mg  and plavix 75mg  daily x 3 weeks, then ASA alone -Pending possible d/c to rehab   2.  History of adrenal insufficiency/Addison's disease -Patient noted with a history of Addison's disease and per patient and boyfriend used to be on prednisone in the past however did not like the way it made her feel and she subsequently discontinued taking it. -Patient states she was in the process of establishing with a PCP in order to get a  referral to endocrinology in the outpatient setting. -Patient over the past month has been having intermittent symptoms of nausea vomiting lightheadedness and dizziness and just not feeling well. -Patient noted not to be orthostatic initially however had received fluid boluses in the ED prior to orthostatics being checked.   -Initial potassium on admission was elevated.   -Patient received a bolus of normal saline currently on IV fluids.  -Patient started on hydrocortisone 10 mg every morning and 5 mg with supper.   -Supportive care.   -Will need outpatient follow-up with endocrinology.  -Pt underwent MRI pituitary that was ordered several months ago outpatient, not ordered this admission. Reviewed. Empty sella, otherwise no appreciable pituitary mass noted   3.  Hypokalemia -Repleted.    4.  Dehydration -given IV fluids.   5.  History of gastritis/duodenitis/history of peptic ulcer disease per patient -Transition from IV PPI to oral PPI daily.     6.  Opioid dependence -Resumed home regimen methadone.   7.  Headache -Patient given a dose of IV Compazine 5 mg followed by morphine 2 mg IV times with some improvement with headache yesterday however still with ongoing headache.   -IV Compazine as needed, IV morphine as needed, will also defer to neurology.     8.  History of seizures -Continue home regimen Keppra.   9.  Hypertension -Started Norvasc 5 mg daily, Cozaar 25 mg daily and uptitrate as needed for better blood pressure control. -BP stable       Subjective: Without complaints  Physical Exam: Vitals:   04/25/23 0915 04/25/23 0930 04/25/23 0945 04/25/23 0959  BP: Marland Kitchen)  176/104 (!) 144/87 130/79 130/78  Pulse: 90 73 78 79  Resp: 13 (!) 9 11 16   Temp: 98.8 F (37.1 C)  98.8 F (37.1 C) 98.7 F (37.1 C)  TempSrc:    Oral  SpO2: 97% 96% 95% 94%  Weight:      Height:       General exam: Conversant, in no acute distress Respiratory system: normal chest rise, clear, no  audible wheezing Cardiovascular system: regular rhythm, s1-s2 Gastrointestinal system: Nondistended, nontender, pos BS Central nervous system: No seizures, no tremors Extremities: No cyanosis, no joint deformities Skin: No rashes, no pallor Psychiatry: Affect normal // no auditory hallucinations   Data Reviewed:  There are no new results to review at this time.  Family Communication: Pt in room, family not at bedside  Disposition: Status is: Inpatient Remains inpatient appropriate because: severity of illness  Planned Discharge Destination: Rehab    Author: Rickey Barbara, MD 04/25/2023 3:53 PM  For on call review [email or web address].

## 2023-04-25 NOTE — Progress Notes (Addendum)
PT Cancellation Note  Patient Details Name: Brittany Mullins MRN: [id number] DOB: January 09, 1981   Cancelled Treatment:    Reason Eval/Treat Not Completed: (P) Patient at procedure or test/unavailable, pt off unit. Will check back as schedule allows to continue with PT POC.  15:40, re-attempted with pt stating she is too sleepy to participate, max encouragement provided.   [name]. PTA Acute Rehabilitation Services Office: [phone]    Catalina Antigua 04/25/2023, 8:54 AM

## 2023-04-25 NOTE — Transfer of Care (Signed)
Immediate Anesthesia Transfer of Care Note  Patient: Anarose Labiche Dillingham  Procedure(s) Performed: MRI of Brain without contrast  Patient Location: PACU  Anesthesia Type:General  Level of Consciousness: awake, alert , and oriented  Airway & Oxygen Therapy: Patient Spontanous Breathing  Post-op Assessment: Report given to RN and Post -op Vital signs reviewed and stable  Post vital signs: Reviewed and stable  Last Vitals:  Vitals Value Taken Time  BP 176/104 04/25/23 0916  Temp    Pulse 102 04/25/23 0921  Resp 15 04/25/23 0921  SpO2 96 % 04/25/23 0921  Vitals shown include unfiled device data.  Last Pain:  Vitals:   04/25/23 0720  TempSrc:   PainSc: 5          Complications: No notable events documented.

## 2023-04-25 NOTE — Progress Notes (Signed)
TRH night cross cover note:   I was notified by RN that the patient continues to experience headache, refractory to existing order for prn acetaminophen.  I subsequently placed order for morphine 4 mg IV x 1 dose prn for her headache.   Newton Pigg, DO Hospitalist

## 2023-04-25 NOTE — Anesthesia Procedure Notes (Signed)
Procedure Name: LMA Insertion Date/Time: 04/25/2023 8:00 AM  Performed by: Sandie Ano, CRNAPre-anesthesia Checklist: Patient identified, Emergency Drugs available, Suction available and Patient being monitored Patient Re-evaluated:Patient Re-evaluated prior to induction Oxygen Delivery Method: Circle System Utilized Preoxygenation: Pre-oxygenation with 100% oxygen Induction Type: IV induction Ventilation: Mask ventilation without difficulty LMA: LMA inserted LMA Size: 4.0 Number of attempts: 1 Airway Equipment and Method: Bite block Placement Confirmation: positive ETCO2 Tube secured with: Tape Dental Injury: Teeth and Oropharynx as per pre-operative assessment

## 2023-04-25 NOTE — Plan of Care (Signed)

## 2023-04-26 ENCOUNTER — Encounter (HOSPITAL_COMMUNITY): Payer: Self-pay | Admitting: Radiology

## 2023-04-26 DIAGNOSIS — I639 Cerebral infarction, unspecified: Secondary | ICD-10-CM | POA: Diagnosis not present

## 2023-04-26 DIAGNOSIS — R531 Weakness: Secondary | ICD-10-CM | POA: Diagnosis not present

## 2023-04-26 DIAGNOSIS — R112 Nausea with vomiting, unspecified: Secondary | ICD-10-CM | POA: Diagnosis not present

## 2023-04-26 MED ORDER — OXYCODONE-ACETAMINOPHEN 5-325 MG PO TABS
1.0000 | ORAL_TABLET | Freq: Four times a day (QID) | ORAL | Status: DC | PRN
  Administered 2023-04-26 – 2023-04-28 (×5): 2 via ORAL
  Administered 2023-04-30: 1 via ORAL
  Administered 2023-04-30: 2 via ORAL
  Administered 2023-05-01: 1 via ORAL
  Administered 2023-05-01: 2 via ORAL
  Filled 2023-04-26 (×3): qty 2
  Filled 2023-04-26: qty 1
  Filled 2023-04-26: qty 2
  Filled 2023-04-26: qty 1
  Filled 2023-04-26 (×3): qty 2

## 2023-04-26 MED ORDER — ALUM & MAG HYDROXIDE-SIMETH 200-200-20 MG/5ML PO SUSP
15.0000 mL | Freq: Four times a day (QID) | ORAL | Status: DC | PRN
  Administered 2023-04-26: 15 mL via ORAL
  Filled 2023-04-26: qty 30

## 2023-04-26 MED ORDER — NALOXONE HCL 0.4 MG/ML IJ SOLN: 0.4000 mg | INTRAMUSCULAR | Status: DC | PRN

## 2023-04-26 NOTE — Progress Notes (Signed)
TRH night cross cover note:   I was notified by RN that the patient is complaining of a painful area behind her left ear, that is noted to be raised, but without erythema or induration.  The patient reports refractory discomfort to this area in spite of interval receipt of acetaminophen and requests additional pain medication to address this.  It is noted that she is also on methadone.  I subsequently ordered prn Percocet to further address the above discomfort.   Newton Pigg, DO Hospitalist

## 2023-04-26 NOTE — Progress Notes (Signed)
Progress Note   Patient: Brittany Mullins [facility] ZOX:[id number] DOB: 1980-09-02 DOA: 04/22/2023     4 DOS: the patient was seen and examined on 04/26/2023   Brief hospital course: 42yo history of Addison's disease, benign brain tumor, hypertension, history of pancreatitis, PCOS, pituitary tumor, seizures, opiate dependence on methadone, bipolar disorder, THC use presented to the ED with right-sided weakness and numbness, right facial numbness with some dysarthric speech. CT angiogram head and neck negative for LVO. MRI consistent with a acute left pontine infarct. Patient admitted for stroke workup. Neurology consulted.   Assessment and Plan: #1 acute left pontine infarct -Patient presented with right-sided upper extremity weakness and numbness with right facial weakness as well as right facial numbness which started at 12 PM the day prior to admission.  Patient not a tPA candidate due to delayed presentation. -CT angiogram head and neck done with no acute abnormality noted, no LVO, no evidence of significant stenosis in the neck. -MRI brain done positive for acute left pontine infarct. -2D echo with normal LVEF, no atrial level shunts -Lower extremity Dopplers neg for DVT -Placed on Norvasc 5 mg daily, Cozaar 25 mg daily for BP control and uptitrate as needed for better blood pressure control.  -LDL obtained at 97 and as such patient started on Lipitor 40 mg daily with goal LDL < 70. -Hypercoagulable panel ordered by neurology. -Neurology consulted. Recs for ASA 81mg  and plavix 75mg  daily x 3 weeks, then ASA alone -Pending possible d/c to rehab. CIR following   2.  History of adrenal insufficiency/Addison's disease -Patient noted with a history of Addison's disease and per patient and boyfriend used to be on prednisone in the past however did not like the way it made her feel and she subsequently discontinued taking it. -Patient states she was in the process of establishing with a PCP in  order to get a referral to endocrinology in the outpatient setting. -Patient over the past month has been having intermittent symptoms of nausea vomiting lightheadedness and dizziness and just not feeling well. -Patient noted not to be orthostatic initially however had received fluid boluses in the ED prior to orthostatics being checked.   -Initial potassium on admission was elevated.   -Patient received a bolus of normal saline currently on IV fluids.  -Patient started on hydrocortisone 10 mg every morning and 5 mg with supper.   -Supportive care.   -Will need outpatient follow-up with endocrinology.  -Pt underwent MRI pituitary that was ordered several months ago outpatient, not ordered this admission. Reviewed. Empty sella, otherwise no appreciable pituitary mass noted   3.  Hypokalemia -Repleted.    4.  Dehydration -given IV fluids.   5.  History of gastritis/duodenitis/history of peptic ulcer disease per patient -Transition from IV PPI to oral PPI daily.     6.  Opioid dependence -Resumed home regimen methadone.   7.  Headache -Patient given a dose of IV Compazine 5 mg followed by morphine 2 mg IV times with some improvement with headache yesterday however still with ongoing headache.   -IV Compazine as needed, IV morphine as needed, will also defer to neurology.     8.  History of seizures -Continue home regimen Keppra.   9.  Hypertension -Started Norvasc 5 mg daily, Cozaar 25 mg daily and uptitrate as needed for better blood pressure control. -BP remains stable      Subjective: No complaints at this time  Physical Exam: Vitals:   04/25/23 8119 04/26/23 0409 04/26/23 1478  04/26/23 1114  BP: 116/69 128/87 136/81 (!) 142/84  Pulse: 68 75 73 81  Resp: 18 18 19 18   Temp: 98.5 F (36.9 C) 98.6 F (37 C) 98.3 F (36.8 C) 98.4 F (36.9 C)  TempSrc: Oral Oral Oral Oral  SpO2: 96% 94% 97% 98%  Weight:      Height:       General exam: Awake, laying in bed, in  nad Respiratory system: Normal respiratory effort, no wheezing Cardiovascular system: regular rate, s1, s2 Gastrointestinal system: Soft, nondistended, positive BS Central nervous system: CN2-12 grossly intact, strength intact Extremities: Perfused, no clubbing Skin: Normal skin turgor, no notable skin lesions seen Psychiatry: Mood normal // no visual hallucinations   Data Reviewed:  There are no new results to review at this time.  Family Communication: Pt in room, family not at bedside  Disposition: Status is: Inpatient Remains inpatient appropriate because: severity of illness  Planned Discharge Destination: Rehab    Author: Rickey Barbara, MD 04/26/2023 3:46 PM  For on call review [email or web address].

## 2023-04-26 NOTE — Plan of Care (Signed)

## 2023-04-26 NOTE — Progress Notes (Signed)
Inpatient Rehabilitation Admissions Coordinator   I met with patient at bedside and spoke with [facility], boyfriend by phone. He request a letter from hospital to assist in obtaining a first floor apartment rather than 3rd floor. Tresa Endo, RN CM aware. He, her son and daughter in law and a hired caregiver can arrange support at home. She would only be alone for possible 2 to 4 hrs max. Has neighbor and a friend in complex who could be reached in case of an emergency. Her service dog, Misty, can visit on CIR when she is admitted. Service dog for seizure detection. They are in agreement, to CIR admit as soon as bed is available.  Ottie Glazier, RN, MSN Rehab Admissions Coordinator [phone] 04/26/2023 3:25 PM

## 2023-04-26 NOTE — Progress Notes (Signed)
Physical Therapy Treatment Patient Details Name: Brittany Mullins MRN: [id number] DOB: [birth year] Today's Date: 04/26/2023   History of Present Illness 43 y.o. female who presented to ED 04/22/23 with right sided weakness, facial numbness and asymmetry. 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 PMHx Addison's disease, brain tumor (benign), Hypertension, Marijuana use, Pancreatitis, PCOS (polycystic ovarian syndrome), Pituitary tumor, and Seizures.    PT Comments  Pt resting in bed on arrival and agreeable to session with continued progress towards acute goals. Pt continues to need increased cues for safety as pt with tendency to move quickly and impulsively with lack of awareness of deficits, however pt very receptive to cues to slow and demonstrating great improvement by end of session. Physically pt requiring CGA to come to sitting EOB, min A to power up to stand and grossly mod A to steady during gait with RW for support. Increased time spent working on sit<>stand with Les staggered to facilitate midline rise with decreased trunk rotation with good return. Current plan remains appropriate to address deficits and maximize functional independence and decrease caregiver burden. Pt continues to benefit from skilled PT services to progress toward functional mobility goals.     If plan is discharge home, recommend the following: A lot of help with walking and/or transfers;A lot of help with bathing/dressing/bathroom;Assistance with cooking/housework;Assistance with feeding;Direct supervision/assist for medications management;Direct supervision/assist for financial management;Assist for transportation;Help with stairs or ramp for entrance;Supervision due to cognitive status   Can travel by private vehicle        Equipment Recommendations  Rolling walker (2 wheels);BSC/3in1    Recommendations for Other Services       Precautions / Restrictions Precautions Precautions:  Fall Restrictions Weight Bearing Restrictions Per Provider Order: No     Mobility  Bed Mobility Overal bed mobility: Needs Assistance Bed Mobility: Supine to Sit     Supine to sit: Contact guard     General bed mobility comments: CGA for safety    Transfers Overall transfer level: Needs assistance Equipment used: Rolling walker (2 wheels), None Transfers: Sit to/from Stand, Bed to chair/wheelchair/BSC Sit to Stand: Min assist           General transfer comment: min A to boost to stand from EOB to RW intially, working on bil foot placement to rise midline without trunk rotation, able to bring RLE back some and stagger LLE forward with good result coming to stand without AD x10    Ambulation/Gait Ambulation/Gait assistance: Mod assist, Min assist Gait Distance (Feet): 70 Feet Assistive device: Rolling walker (2 wheels) Gait Pattern/deviations: Step-to pattern, Decreased stance time - right, Decreased step length - right, Decreased dorsiflexion - right, Decreased weight shift to right, Ataxic Gait velocity: VSS on RA     General Gait Details: pt with some flexion synergy during swing phase on R with increased hip/trunk felxion needing mod A to maintain upright trunk, cues for wider BOS as pt with RLE adduction during swing phase as well   Stairs             Wheelchair Mobility     Tilt Bed    Modified Rankin (Stroke Patients Only) Modified Rankin (Stroke Patients Only) Pre-Morbid Rankin Score: No symptoms Modified Rankin: Moderately severe disability     Balance Overall balance assessment: Needs assistance Sitting-balance support: Bilateral upper extremity supported, Feet supported Sitting balance-Leahy Scale: Fair Sitting balance - Comments: frequent moving when seated EOB needing CGA for safety'   Standing balance support: Bilateral upper extremity  supported, During functional activity, Reliant on assistive device for balance Standing balance-Leahy  Scale: Poor Standing balance comment: reliant on external support                            Cognition Arousal: Alert Behavior During Therapy: WFL for tasks assessed/performed, Anxious Overall Cognitive Status: Within Functional Limits for tasks assessed                                 General Comments: at times moving impulsively during session, cues to slow and wait for this PTA, general cues to move more slowly for safety        Exercises General Exercises - Lower Extremity Long Arc Quad: AROM, Right, Seated, 10 reps Hip Flexion/Marching: AROM, 10 reps, Left, Seated Other Exercises Other Exercises: serial sit<>stands with staggered stand RLE behind LLE to facilitate midline rise with decreased trunk rotation, able to complete x10 with light min A    General Comments        Pertinent Vitals/Pain Pain Assessment Pain Assessment: No/denies pain Pain Intervention(s): Monitored during session    Home Living                          Prior Function            PT Goals (current goals can now be found in the care plan section) Acute Rehab PT Goals Patient Stated Goal: get mobility back PT Goal Formulation: With patient/family Time For Goal Achievement: 05/07/23 Progress towards PT goals: Progressing toward goals    Frequency    Min 1X/week      PT Plan      Co-evaluation              AM-PAC PT "6 Clicks" Mobility   Outcome Measure  Help needed turning from your back to your side while in a flat bed without using bedrails?: A Little Help needed moving from lying on your back to sitting on the side of a flat bed without using bedrails?: A Little Help needed moving to and from a bed to a chair (including a wheelchair)?: A Lot Help needed standing up from a chair using your arms (e.g., wheelchair or bedside chair)?: A Little Help needed to walk in hospital room?: A Lot Help needed climbing 3-5 steps with a railing? :  Total 6 Click Score: 14    End of Session Equipment Utilized During Treatment: Gait belt Activity Tolerance: Patient tolerated treatment well Patient left: with call bell/phone within reach;in bed;with bed alarm set Nurse Communication: Mobility status PT Visit Diagnosis: Unsteadiness on feet (R26.81);Other abnormalities of gait and mobility (R26.89);Muscle weakness (generalized) (M62.81);Difficulty in walking, not elsewhere classified (R26.2);Other symptoms and signs involving the nervous system (R29.898);Hemiplegia and hemiparesis Hemiplegia - Right/Left: Right Hemiplegia - dominant/non-dominant: Dominant Hemiplegia - caused by: Cerebral infarction     Time: 0865-7846 PT Time Calculation (min) (ACUTE ONLY): 27 min  Charges:    $Gait Training: 8-22 mins $Therapeutic Exercise: 8-22 mins PT General Charges $$ ACUTE PT VISIT: 1 Visit                     Halen Antenucci R. PTA Acute Rehabilitation Services Office: [phone]   Catalina Antigua 04/26/2023, 3:51 PM

## 2023-04-26 NOTE — TOC Progression Note (Signed)
Transition of Care [facility]) - Progression Note    Patient Details  Name: Brittany Mullins MRN: [id number] Date of Birth: [birth year]  [facility] of [facility]) CM/SW Contact  Kermit Balo, RN Phone Number: 04/26/2023, 4:22 PM  Clinical Narrative:     Plan is for CIR when bed available.  TOC following.  Expected Discharge Plan: IP Rehab Facility Barriers to Discharge: Continued Medical Work up  Expected Discharge Plan and Services   Discharge Planning Services: CM Consult Post Acute Care Choice: IP Rehab Living arrangements for the past 2 months: Apartment                                       Social Determinants of Health (SDOH) Interventions SDOH Screenings   Food Insecurity: No Food Insecurity (04/22/2023)  Housing: High Risk (04/22/2023)  Transportation Needs: No Transportation Needs (04/22/2023)  Utilities: Not At Risk (04/22/2023)  Tobacco Use: Low Risk  (04/25/2023)    Readmission Risk Interventions     No data to display

## 2023-04-26 NOTE — Progress Notes (Signed)
TRH night cross cover note:   Prn Maalox ordered for patient's report of heartburn.     Newton Pigg, DO Hospitalist

## 2023-04-26 NOTE — PMR Pre-admission (Signed)
PMR Admission Coordinator Pre-Admission Assessment  Patient: Brittany Mullins is an 43 y.o., female MRN: [id number] DOB: [birth year] Height: 5\' 2"  (157.5 cm) Weight: 68 kg  Insurance Information HMO:     PPO:      PCP:      IPA:      80/20:      OTHER:  PRIMARY: Medicare a and b      Policy#: [id number]       Subscriber: pt Benefits:  Phone #: passport one source online     Name: 1/24 Eff. Date: 02/01/2011     Deduct: $1676      Out of Pocket Max: none      Life Max: none CIR: 100%      SNF: 20 full days Outpatient: 80%     Co-Pay: 20% Home Health: 100%      Co-Pay: none DME: 80%     Co-Pay: 20% Providers: patient choice SECONDARY: Medicaid of [place]      Policy#: [id number] o Verified on 1/24 on passport one source online active MADQB       Financial Counselor:       Phone#:   The "Data Collection Information Summary" for patients in Inpatient Rehabilitation Facilities with attached "Privacy Act Statement-Health Care Records" was provided and verbally reviewed with: Patient and Family  Emergency Contact Information Contact Information     Name Relation Home Work Mobile   Morris,[name] Significant other   [phone]   Leianne, Callins [phone]  [phone]      Other Contacts   None on File    Current Medical History  Patient Admitting Diagnosis: CVA  History of Present Illness: 43 year old female with history of gastritis/duodenitis, Addison's disease, pituitary tumor, empty sells syndrome, HTN, opiate dependence on methadone, bipolar disease, THC use, PCOS, seizures presented to the ED on 04/22/23 with right sided weakness and numbness.   MRI showed left pontine infarct. CTA no LVO. No interventions due to outside of window. UDS positive THC. DVT prophylaxis with lovenox. Placed on ASA and Plavix for 3 weeks  and then ASA alone. Keppra continued as at home. Typically has 1 to 2 seizures per month but can increase to 1-2 seizures a week. Has service Dog for seizure  detection. On no home meds for HTN. Placed on Norvasc and Cozaar. LDL 94 and placed on Lipitor. On methadone at home due to pain and nausea from chronic pancreatitis. Estrogen for PCOS.  Developed increased left sided weakness. Stroke team recommended repeat CT and LTC EEG. No evidence of definite seizures.  Keppra dose adjusted. Noted left upper extremity hypertonia flexion which had also raised concerns for seizure.   Complete NIHSS TOTAL: 3  Patient's medical record from [facility] has been reviewed by the rehabilitation admission coordinator and physician.  Past Medical History  Past Medical History:  Diagnosis Date   Addison's disease (HCC)    Brain tumor (benign) (HCC)    Hypertension    Marijuana use    Per pt: "medical marijuana patient"   Pancreatitis    PCOS (polycystic ovarian syndrome)    Pituitary tumor    Seizures (HCC)    Has the patient had major surgery during 100 days prior to admission? No  Family History   family history includes Cancer in her mother; Diabetes in her maternal grandfather and maternal grandmother; Hypertension in her mother.  Current Medications  Current Facility-Administered Medications:    acetaminophen (TYLENOL) tablet 650 mg, 650 mg, Oral, Q6H PRN,  650 mg at 04/27/23 2223 **OR** acetaminophen (TYLENOL) suppository 650 mg, 650 mg, Rectal, Q6H PRN, Rodolph Bong, MD   albuterol (PROVENTIL) (2.5 MG/3ML) 0.083% nebulizer solution 2.5 mg, 2.5 mg, Nebulization, Q2H PRN, Rodolph Bong, MD   alum & mag hydroxide-simeth (MAALOX/MYLANTA) 200-200-20 MG/5ML suspension 15 mL, 15 mL, Oral, Q6H PRN, Howerter, Justin B, DO, 15 mL at 04/26/23 0440   amLODipine (NORVASC) tablet 5 mg, 5 mg, Oral, Daily, Rodolph Bong, MD, 5 mg at 04/30/23 1610   aspirin EC tablet 81 mg, 81 mg, Oral, Daily, Marvel Plan, MD, 81 mg at 04/30/23 0843   atorvastatin (LIPITOR) tablet 40 mg, 40 mg, Oral, Daily, Rodolph Bong, MD, 40 mg at 04/30/23 0844    clopidogrel (PLAVIX) tablet 75 mg, 75 mg, Oral, Daily, Rodolph Bong, MD, 75 mg at 04/30/23 0846   cyclobenzaprine (FLEXERIL) tablet 5 mg, 5 mg, Oral, TID PRN, Jerald Kief, MD, 5 mg at 04/28/23 2253   enoxaparin (LOVENOX) injection 40 mg, 40 mg, Subcutaneous, Q24H, Rodolph Bong, MD, 40 mg at 04/29/23 1804   gabapentin (NEURONTIN) capsule 300 mg, 300 mg, Oral, TID, Gevena Mart A, NP, 300 mg at 04/30/23 1647   hydrALAZINE (APRESOLINE) injection 10 mg, 10 mg, Intravenous, Q6H PRN, Rodolph Bong, MD   hydrocortisone (CORTEF) tablet 5 mg, 5 mg, Oral, QAC supper, 5 mg at 04/30/23 1647 **AND** hydrocortisone (CORTEF) tablet 10 mg, 10 mg, Oral, Q breakfast, Rodolph Bong, MD, 10 mg at 04/30/23 0843   levETIRAcetam (KEPPRA) tablet 750 mg, 750 mg, Oral, Q8H, Sethi, Jason Fila, MD   levonorgestrel-ethinyl estradiol (ALESSE) 0.1-20 MG-MCG per tablet 1 tablet, 1 tablet, Oral, Daily, Rodolph Bong, MD, 1 tablet at 04/30/23 1054   LORazepam (ATIVAN) injection 1 mg, 1 mg, Intravenous, Once, Howerter, Justin B, DO   losartan (COZAAR) tablet 25 mg, 25 mg, Oral, Daily, Rodolph Bong, MD, 25 mg at 04/30/23 0844   methadone (DOLOPHINE) tablet 120 mg, 120 mg, Oral, Daily, Rodolph Bong, MD, 120 mg at 04/30/23 0844   naloxone [facility]) injection 0.4 mg, 0.4 mg, Intravenous, PRN, Howerter, Justin B, DO   ondansetron (ZOFRAN) injection 4 mg, 4 mg, Intravenous, Q6H PRN, Jerald Kief, MD, 4 mg at 04/28/23 1603   oxyCODONE-acetaminophen (PERCOCET/ROXICET) 5-325 MG per tablet 1-2 tablet, 1-2 tablet, Oral, Q6H PRN, Howerter, Justin B, DO, 2 tablet at 04/30/23 1250   pantoprazole (PROTONIX) EC tablet 40 mg, 40 mg, Oral, Q0600, Rodolph Bong, MD, 40 mg at 04/30/23 0523   polyethylene glycol (MIRALAX / GLYCOLAX) packet 17 g, 17 g, Oral, Daily PRN, Rodolph Bong, MD, 17 g at 04/26/23 9604   promethazine (PHENERGAN) tablet 25 mg, 25 mg, Oral, Q6H PRN, Jerald Kief, MD, 25 mg at  04/29/23 1313   senna (SENOKOT) tablet 8.6 mg, 1 tablet, Oral, BID, Rodolph Bong, MD, 8.6 mg at 04/30/23 0844   sodium chloride flush (NS) 0.9 % injection 3 mL, 3 mL, Intravenous, Q12H, Rodolph Bong, MD, 3 mL at 04/30/23 1053   sorbitol 70 % solution 30 mL, 30 mL, Oral, Daily PRN, Rodolph Bong, MD  Patients Current Diet:  Diet Order             Diet Heart Room service appropriate? Yes; Fluid consistency: Thin  Diet effective now                  Precautions / Restrictions Precautions Precautions: Fall Precaution Comments: watch  BP Restrictions Weight Bearing Restrictions Per Provider Order: No   Has the patient had 2 or more falls or a fall with injury in the past year? No  Prior Activity Level Community (5-7x/wk): Independent, driving, no AD (Has service dog, Misty, pit bull for 1 1/2 years for seizure detection)  Prior Functional Level Self Care: Did the patient need help bathing, dressing, using the toilet or eating? Independent  Indoor Mobility: Did the patient need assistance with walking from room to room (with or without device)? Independent  Stairs: Did the patient need assistance with internal or external stairs (with or without device)? Independent  Functional Cognition: Did the patient need help planning regular tasks such as shopping or remembering to take medications? Needed some help  Patient Information Are you of Hispanic, Latino/a,or Spanish origin?: A. No, not of Hispanic, Latino/a, or Spanish origin What is your race?: A. White Do you need or want an interpreter to communicate with a doctor or health care staff?: 0. No  Patient's Response To:  Health Literacy and Transportation Is the patient able to respond to health literacy and transportation needs?: Yes Health Literacy - How often do you need to have someone help you when you read instructions, pamphlets, or other written material from your doctor or pharmacy?: Sometimes In the  past 12 months, has lack of transportation kept you from medical appointments or from getting medications?: No In the past 12 months, has lack of transportation kept you from meetings, work, or from getting things needed for daily living?: No  Journalist, newspaper / Equipment Home Equipment: None  Prior Device Use: Indicate devices/aids used by the patient prior to current illness, exacerbation or injury? None of the above  Current Functional Level Cognition  Overall Cognitive Status: Within Functional Limits for tasks assessed Orientation Level: Oriented X4 General Comments: improvmement with impulsive movements    Extremity Assessment (includes Sensation/Coordination)  Upper Extremity Assessment: Right hand dominant, RUE deficits/detail, LUE deficits/detail RUE Deficits / Details: 3+/5 distal vs proximal weakness, can composite flex/ext digits and can sustain grasp for ~1 min during functional task RUE Sensation: decreased light touch RUE Coordination: decreased fine motor, decreased gross motor LUE Deficits / Details: pt able to make a first and release with incr time, minimal wrist movement, keeps in mildly flexed position. Keeps elbow bent 90*, no AROM or PROM, can abduct shoulder ~30* LUE Coordination: decreased fine motor, decreased gross motor  Lower Extremity Assessment: Defer to PT evaluation RLE Deficits / Details: R knee in extension on entry, pt very concerned as she could not volitionally bend her knee, PT attempted to perform AAROM for knee flexion and met with increase resistance with PT's increased pressure, similar results with attempts for performing AAROM into R ankle dorsiflexion, however when pt moved to EoB her knee bent easily, pt then able to engage in AROM of knee flexion and extension with increased effort, RLE Sensation: decreased light touch RLE Coordination: decreased gross motor    ADLs  Overall ADL's : Needs assistance/impaired Eating/Feeding: Minimal  assistance Grooming: Moderate assistance, Sitting Grooming Details (indicate cue type and reason): oral care sitting at sink Upper Body Bathing: Moderate assistance Lower Body Bathing: Maximal assistance Upper Body Dressing : Moderate assistance Lower Body Dressing: Maximal assistance Lower Body Dressing Details (indicate cue type and reason): able to self assist into figure 4 positioning for each LE.  education on not bending forward while seated eob for fall prevention Toilet Transfer: Minimal assistance, Stand-pivot, BSC/3in1 Toileting- Clothing  Manipulation and Hygiene: Minimal assistance Functional mobility during ADLs: Minimal assistance General ADL Comments: cues for relaxation tech. including PLB. pt. with visible tension B shoulders raised and holding breath.  responded well to tech. and was able to imlement the breathing stratgies during in room ambulation    Mobility  Overal bed mobility: Needs Assistance Bed Mobility: Supine to Sit Supine to sit: Contact guard General bed mobility comments: CGA for safety with increased time    Transfers  Overall transfer level: Needs assistance Equipment used: Left platform walker, None Transfers: Sit to/from Stand, Bed to chair/wheelchair/BSC Sit to Stand: Min assist, Contact guard assist Bed to/from chair/wheelchair/BSC transfer type:: Step pivot Stand pivot transfers: Min assist Step pivot transfers: Min assist General transfer comment: min A to boost and steady on rise with initial stand to PFRW, fading to CGA with practice, min A to step pivot to chair at end of session without UE support    Ambulation / Gait / Stairs / Wheelchair Mobility  Ambulation/Gait Ambulation/Gait assistance: Mod assist, Min assist Gait Distance (Feet): 24 Feet (x2) Assistive device: Left platform walker Gait Pattern/deviations: Step-to pattern, Decreased stance time - right, Decreased step length - right, Decreased dorsiflexion - right, Decreased weight  shift to right, Ataxic General Gait Details: PFRW utilized as pt with flexed L elbow and unable to extend arm to use standard RW, mod A fading to min A with distance to steady and manage RW, cues for wider BOS as pt with contineud RLE adduction during swing phase Gait velocity: decr    Posture / Balance Dynamic Sitting Balance Sitting balance - Comments: frequent moving when seated EOB needing CGA for safety' Balance Overall balance assessment: Needs assistance Sitting-balance support: Bilateral upper extremity supported, Feet supported Sitting balance-Leahy Scale: Fair Sitting balance - Comments: frequent moving when seated EOB needing CGA for safety' Standing balance support: Bilateral upper extremity supported, During functional activity, Reliant on assistive device for balance Standing balance-Leahy Scale: Poor Standing balance comment: reliant on external support    Special needs/care consideration Seizure precautions Service Dog for seizure detection; He will visit for short times only   Previous Home Environment  Living Arrangements:  Durene Cal for about 1 1/2 years)  Lives With:  Durene Cal) Available Help at Discharge: Family, Available 24 hours/day (longest time may be alone is 2 to 4 hrs) Type of Home: Apartment Home Layout: One level Home Access: Stairs to enter Entrance Stairs-Rails: Can reach both Entrance Stairs-Number of Steps:  (3rd floor apartment currently) Bathroom Shower/Tub: Engineer, manufacturing systems: Standard Bathroom Accessibility: Yes How Accessible: Accessible via walker Home Care Services: No Additional Comments: Hilda Lias RN CM, to provide [place] with note to give apartment complex to request first floor apartment  Discharge Living Setting Plans for Discharge Living Setting: Lives with (comment), Apartment ([name]) Type of Home at Discharge: Apartment Discharge Home Layout: One level Discharge Home Access: Stairs to enter Entrance Stairs-Rails:  Right, Left, Can reach both Entrance Stairs-Number of Steps: 3rd floor apartment Discharge Bathroom Shower/Tub: Tub/shower unit Discharge Bathroom Toilet: Standard Discharge Bathroom Accessibility: Yes How Accessible: Accessible via walker Does the patient have any problems obtaining your medications?: No  Social/Family/Support Systems Patient Roles: Parent, Partner Contact Information: Durene Cal, Boyfriend, is primary contact Anticipated Caregiver: Durene Cal and her daughter in Social worker and hired caregiver for about 2 hrs per day Anticipated Caregiver's Contact Information: see contacts Ability/Limitations of Caregiver: Durene Cal works 630 until 4, dtr in Social worker works as Child psychotherapist 3 days per week; hired caregiver likely  good be ther 2 hrs per day when noone in family there. Longest span likely 2 hrs without caregiver Caregiver Availability: 24/7 Discharge Plan Discussed with Primary Caregiver: Yes Is Caregiver In Agreement with Plan?: Yes Does Caregiver/Family have Issues with Lodging/Transportation while Pt is in Rehab?: No  Goals Patient/Family Goal for Rehab: Mod I to supervision with PT, OT and SLP Expected length of stay: ELOS 10 to 14 days Additional Information: Service Dog, Misty , to visit with [facility] for short visits. He is trained to detect seizures Pt/Family Agrees to Admission and willing to participate: Yes Program Orientation Provided & Reviewed with Pt/Caregiver Including Roles  & Responsibilities: Yes  Decrease burden of Care through IP rehab admission: n/a  Possible need for SNF placement upon discharge: not anticipated  Patient Condition: I have reviewed medical records from [facility], spoken with CM, and patient and spouse. I met with patient at the bedside and discussed via phone for inpatient rehabilitation assessment.  Patient will benefit from ongoing PT, OT, and SLP, can actively participate in 3 hours of therapy a day 5 days of the week, and can make measurable gains  during the admission.  Patient will also benefit from the coordinated team approach during an Inpatient Acute Rehabilitation admission.  The patient will receive intensive therapy as well as Rehabilitation physician, nursing, social worker, and care management interventions.  Due to bladder management, bowel management, safety, skin/wound care, disease management, medication administration, pain management, and patient education the patient requires 24 hour a day rehabilitation nursing.  The patient is currently *** with mobility and basic ADLs.  Discharge setting and therapy post discharge at home with home health is anticipated.  Patient has agreed to participate in the Acute Inpatient Rehabilitation Program and will admit {Time; today/tomorrow:10263}.  Preadmission Screen Completed By:  Clois Dupes, RN MSN 04/30/2023 5:26 PM ______________________________________________________________________   Discussed status with Dr. Marland Kitchen on *** at *** and received approval for admission today.  Admission Coordinator:  Clois Dupes, RN MSN time Marland KitchenDorna Bloom ***   Assessment/Plan: Diagnosis: *** Does the need for close, 24 hr/day Medical supervision in concert with the patient's rehab needs make it unreasonable for this patient to be served in a less intensive setting? {yes_no_potentially:3041433} Co-Morbidities requiring supervision/potential complications: *** Due to {due GM:0102725}, does the patient require 24 hr/day rehab nursing? {yes_no_potentially:3041433} Does the patient require coordinated care of a physician, rehab nurse, PT, OT, and SLP to address physical and functional deficits in the context of the above medical diagnosis(es)? {yes_no_potentially:3041433} Addressing deficits in the following areas: {deficits:3041436} Can the patient actively participate in an intensive therapy program of at least 3 hrs of therapy 5 days a week? {yes_no_potentially:3041433} The potential for  patient to make measurable gains while on inpatient rehab is {potential:3041437} Anticipated functional outcomes upon discharge from inpatient rehab: {functional outcomes:304600100} PT, {functional outcomes:304600100} OT, {functional outcomes:304600100} SLP Estimated rehab length of stay to reach the above functional goals is: *** Anticipated discharge destination: {anticipated dc setting:21604} 10. Overall Rehab/Functional Prognosis: {potential:3041437}   MD Signature: ***

## 2023-04-27 ENCOUNTER — Inpatient Hospital Stay (HOSPITAL_COMMUNITY): Payer: Medicare Other

## 2023-04-27 DIAGNOSIS — I639 Cerebral infarction, unspecified: Secondary | ICD-10-CM | POA: Diagnosis not present

## 2023-04-27 DIAGNOSIS — R531 Weakness: Secondary | ICD-10-CM | POA: Diagnosis not present

## 2023-04-27 MED ORDER — ONDANSETRON HCL 4 MG/2ML IJ SOLN
4.0000 mg | Freq: Four times a day (QID) | INTRAMUSCULAR | Status: DC | PRN
  Administered 2023-04-27 – 2023-04-28 (×2): 4 mg via INTRAVENOUS
  Filled 2023-04-27 (×2): qty 2

## 2023-04-27 MED ORDER — HYDROMORPHONE HCL 1 MG/ML IJ SOLN
1.0000 mg | Freq: Once | INTRAMUSCULAR | Status: AC
  Administered 2023-04-27: 1 mg via INTRAVENOUS
  Filled 2023-04-27: qty 1

## 2023-04-27 NOTE — Progress Notes (Signed)
Progress Note   Patient: Brittany Mullins [facility] WCB:[id number] DOB: 1980/11/16 DOA: 04/22/2023     5 DOS: the patient was seen and examined on 04/27/2023   Brief hospital course: 43yo history of Addison's disease, benign brain tumor, hypertension, history of pancreatitis, PCOS, pituitary tumor, seizures, opiate dependence on methadone, bipolar disorder, THC use presented to the ED with right-sided weakness and numbness, right facial numbness with some dysarthric speech. CT angiogram head and neck negative for LVO. MRI consistent with a acute left pontine infarct. Patient admitted for stroke workup. Neurology consulted.   Assessment and Plan: #1 acute left pontine infarct -Patient presented with right-sided upper extremity weakness and numbness with right facial weakness as well as right facial numbness which started at 12 PM the day prior to admission.  Patient not a tPA candidate due to delayed presentation. -CT angiogram head and neck done with no acute abnormality noted, no LVO, no evidence of significant stenosis in the neck. -MRI brain done positive for acute left pontine infarct. -2D echo with normal LVEF, no atrial level shunts -Lower extremity Dopplers neg for DVT -Placed on Norvasc 5 mg daily, Cozaar 25 mg daily for BP control and uptitrate as needed for better blood pressure control.  -LDL obtained at 97 and as such patient started on Lipitor 40 mg daily with goal LDL < 70. -Hypercoagulable panel ordered by neurology. -Neurology consulted. Recs for ASA 81mg  and plavix 75mg  daily x 3 weeks, then ASA alone -awaiting possible d/c to rehab. CIR following   2.  History of adrenal insufficiency/Addison's disease -Patient noted with a history of Addison's disease and per patient and boyfriend used to be on prednisone in the past however did not like the way it made her feel and she subsequently discontinued taking it. -Patient states she was in the process of establishing with a PCP in  order to get a referral to endocrinology in the outpatient setting. -Patient over the past month has been having intermittent symptoms of nausea vomiting lightheadedness and dizziness and just not feeling well. -Patient noted not to be orthostatic initially however had received fluid boluses in the ED prior to orthostatics being checked.   -Initial potassium on admission was elevated.   -Patient received a bolus of normal saline currently on IV fluids.  -Patient started on hydrocortisone 10 mg every morning and 5 mg with supper.   -Supportive care.   -Will need outpatient follow-up with endocrinology.  -Pt underwent MRI pituitary that was ordered several months ago outpatient, not ordered this admission. Reviewed. Empty sella, otherwise no appreciable pituitary mass noted   3.  Hypokalemia -Replaced   4.  Dehydration -given IV fluids.   5.  History of gastritis/duodenitis/history of peptic ulcer disease per patient -Transition from IV PPI to oral PPI daily.     6.  Opioid dependence -Resumed home regimen methadone.   7.  Headache -Patient given a dose of IV Compazine 5 mg followed by morphine 2 mg IV times with some improvement with headache yesterday however still with ongoing headache.   -IV Compazine as needed, IV morphine as needed, will also defer to neurology.     8.  History of seizures -Continue home regimen Keppra.   9.  Hypertension -Started Norvasc 5 mg daily, Cozaar 25 mg daily and uptitrate as needed for better blood pressure control. -BP remains stable  10. Tender nodule behind L ear -tender, firm, mobile nodule behind L ear noted. Not erythematous -Suspect possible reactive post-auricular node given location -  Recent CT and MR unrevealing -Will check dedicated soft tissue neck xray -cont analgesia and heat to area for now.      Subjective: Complains of tender nodule behind L ear  Physical Exam: Vitals:   04/27/23 0026 04/27/23 0419 04/27/23 0722 04/27/23  1155  BP: (!) 147/89 (!) 157/96 (!) 144/98 (!) 158/90  Pulse: 85 81 87 84  Resp: 17 18 17 17   Temp: 98.6 F (37 C) 97.6 F (36.4 C) 98.2 F (36.8 C) 98.5 F (36.9 C)  TempSrc: Oral  Oral Oral  SpO2: 100% 96% 99% 99%  Weight:      Height:       General exam: Conversant, in no acute distress Respiratory system: normal chest rise, clear, no audible wheezing Cardiovascular system: regular rhythm, s1-s2 Gastrointestinal system: Nondistended, nontender, pos BS Central nervous system: No seizures, no tremors Extremities: No cyanosis, no joint deformities Skin: No rashes, no pallor Psychiatry: Affect normal // no auditory hallucinations   Data Reviewed:  There are no new results to review at this time.  Family Communication: Pt in room, family not at bedside  Disposition: Status is: Inpatient Remains inpatient appropriate because: severity of illness  Planned Discharge Destination: Rehab    Author: Rickey Barbara, MD 04/27/2023 3:46 PM  For on call review [email or web address].

## 2023-04-27 NOTE — Progress Notes (Addendum)
Occupational Therapy Treatment Patient Details Name: Brittany Mullins MRN: [id number] DOB: [birth year] Today's Date: 04/27/2023   History of present illness 43 y.o. female who presented to ED 04/22/23 with right sided weakness, facial numbness and asymmetry. 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 PMHx Addison's disease, brain tumor (benign), Hypertension, Marijuana use, Pancreatitis, PCOS (polycystic ovarian syndrome), Pituitary tumor, and Seizures.   OT comments  Pt. Seen for skilled OT treatment session.  Significant other present during session.  Bed mobility with CGA.  LB dressing with CGA with cues for sequencing.  Ambulation to b.room with MIN A. Exiting b.room pt. C/o dizziness and assisted to recliner.  (See below for BPs).  Reviewed with RN also and states ok for Pt. In recliner at end of session.  call bell in reach no other reported needs.  Cont. With acute OT POC.    BP: Seated after initial c/o dizziness 155/104 (121) 96 Seated after 3 min. 134/101 (114) 89 Seated after 5 min. 157/104 (120)      If plan is discharge home, recommend the following:  A lot of help with walking and/or transfers;A lot of help with bathing/dressing/bathroom;Assistance with cooking/housework;Direct supervision/assist for medications management;Direct supervision/assist for financial management;Help with stairs or ramp for entrance;Assist for transportation;Supervision due to cognitive status   Equipment Recommendations  Other (comment)    Recommendations for Other Services PT consult;Rehab consult    Precautions / Restrictions Precautions Precautions: Fall Precaution Comments: watch BP       Mobility Bed Mobility Overal bed mobility: Needs Assistance Bed Mobility: Supine to Sit     Supine to sit: Contact guard     General bed mobility comments: CGA for safety    Transfers Overall transfer level: Needs assistance Equipment used: Rolling walker (2 wheels) Transfers: Sit  to/from Stand, Bed to chair/wheelchair/BSC Sit to Stand: Min assist     Step pivot transfers: Min assist     General transfer comment: during ambulation from b.room pt. c/o dizziness.  assisted into recliner that was nearby.  BPs taken and reviewed with RN. permission to remain in recliner at end of session     Balance                                           ADL either performed or assessed with clinical judgement   ADL Overall ADL's : Needs assistance/impaired                     Lower Body Dressing: Contact guard assist;Sitting/lateral leans Lower Body Dressing Details (indicate cue type and reason): able to self assist into figure 4 positioning for each LE.  education on not bending forward while seated eob for fall prevention Toilet Transfer: Minimal assistance;Ambulation;Rolling walker (2 wheels);Regular Toilet;Grab bars   Toileting- Clothing Manipulation and Hygiene: Set up/MIN A;Sitting/lateral lean with set up, MIN A in standing to pull up/down underwear       Functional mobility during ADLs: Minimal assistance General ADL Comments: cues for relaxation tech. including PLB. pt. with visible tension B shoulders raised and holding breath.  responded well to tech. and was able to imlement the breathing stratgies during in room ambulation    Extremity/Trunk Assessment              Vision       Perception     Praxis  Cognition Arousal: Alert Behavior During Therapy: WFL for tasks assessed/performed, Anxious Overall Cognitive Status: Within Functional Limits for tasks assessed                                 General Comments: improvmement with impulsive movements with good carryover from yesterday's education on this        Exercises      Shoulder Instructions       General Comments  Provided batteries for fan as it was losing power and would need replacement soon    Pertinent Vitals/ Pain       Pain  Assessment Pain Assessment: Faces Faces Pain Scale: Hurts little more Pain Location: headache and raised area behind L ear Pain Descriptors / Indicators: Grimacing, Guarding  Home Living                                          Prior Functioning/Environment              Frequency  Min 1X/week        Progress Toward Goals  OT Goals(current goals can now be found in the care plan section)  Progress towards OT goals: Progressing toward goals     Plan      Co-evaluation                 AM-PAC OT "6 Clicks" Daily Activity     Outcome Measure   Help from another person eating meals?: A Little Help from another person taking care of personal grooming?: A Little Help from another person toileting, which includes using toliet, bedpan, or urinal?: A Lot Help from another person bathing (including washing, rinsing, drying)?: A Lot Help from another person to put on and taking off regular upper body clothing?: A Little Help from another person to put on and taking off regular lower body clothing?: A Lot 6 Click Score: 15    End of Session Equipment Utilized During Treatment: Gait belt;Rolling walker (2 wheels)  OT Visit Diagnosis: Unsteadiness on feet (R26.81);Other abnormalities of gait and mobility (R26.89);Muscle weakness (generalized) (M62.81)   Activity Tolerance Patient tolerated treatment well   Patient Left in chair;with call bell/phone within reach;with family/visitor present   Nurse Communication  (pt. c/o dizziness BPs taken. RN states ok recliner. chair alarm not needed. pt. and significant other verbalize understanding HAS to have A from staff. melatonin bottle found lid off rn aware had found it previously)        Time: 0272-5366 OT Time Calculation (min): 26 min  Charges: OT General Charges $OT Visit: 1 Visit OT Treatments $Self Care/Home Management : 23-37 mins  Boneta Lucks, COTA/L Acute Rehabilitation [phone]   Alessandra Bevels Lorraine-COTA/L 04/27/2023, 11:07 AM

## 2023-04-27 NOTE — Plan of Care (Signed)

## 2023-04-28 ENCOUNTER — Inpatient Hospital Stay (HOSPITAL_COMMUNITY): Payer: Medicare Other

## 2023-04-28 DIAGNOSIS — I639 Cerebral infarction, unspecified: Secondary | ICD-10-CM | POA: Diagnosis not present

## 2023-04-28 DIAGNOSIS — R531 Weakness: Secondary | ICD-10-CM | POA: Diagnosis not present

## 2023-04-28 LAB — COMPREHENSIVE METABOLIC PANEL
ALT: 27 U/L (ref 0–44)
AST: 32 U/L (ref 15–41)
Albumin: 3.5 g/dL (ref 3.5–5.0)
Alkaline Phosphatase: 48 U/L (ref 38–126)
Anion gap: 9 (ref 5–15)
BUN: 16 mg/dL (ref 6–20)
CO2: 25 mmol/L (ref 22–32)
Calcium: 9 mg/dL (ref 8.9–10.3)
Chloride: 101 mmol/L (ref 98–111)
Creatinine, Ser: 0.89 mg/dL (ref 0.44–1.00)
GFR, Estimated: >60 mL/min (ref >60)
Glucose, Bld: 87 mg/dL (ref 70–99)
Potassium: 4.2 mmol/L (ref 3.5–5.1)
Sodium: 135 mmol/L (ref 135–145)
Total Bilirubin: 0.5 mg/dL (ref 0.0–1.2)
Total Protein: 6.4 g/dL — ABNORMAL LOW (ref 6.5–8.1)

## 2023-04-28 LAB — CBC
HCT: 41.2 % (ref 36.0–46.0)
Hemoglobin: 14.2 g/dL (ref 12.0–15.0)
MCH: 31.3 pg (ref 26.0–34.0)
MCHC: 34.5 g/dL (ref 30.0–36.0)
MCV: 90.7 fL (ref 80.0–100.0)
Platelets: 278 10*3/uL (ref 150–400)
RBC: 4.54 MIL/uL (ref 3.87–5.11)
RDW: 12.3 % (ref 11.5–15.5)
WBC: 8.4 10*3/uL (ref 4.0–10.5)
nRBC: 0 % (ref 0.0–0.2)

## 2023-04-28 MED ORDER — CYCLOBENZAPRINE HCL 10 MG PO TABS
5.0000 mg | ORAL_TABLET | Freq: Three times a day (TID) | ORAL | Status: DC | PRN
  Administered 2023-04-28: 5 mg via ORAL
  Filled 2023-04-28: qty 1

## 2023-04-28 NOTE — Plan of Care (Signed)
Problem: Education: Goal: Knowledge of disease or condition will improve 04/28/2023 1739 by Wylie Hail, RN Outcome: Progressing 04/28/2023 1739 by Wylie Hail, RN Outcome: Progressing Goal: Knowledge of secondary prevention will improve (MUST DOCUMENT ALL) 04/28/2023 1739 by Wylie Hail, RN Outcome: Progressing 04/28/2023 1739 by Wylie Hail, RN Outcome: Progressing Goal: Knowledge of patient specific risk factors will improve (DELETE if not current risk factor) 04/28/2023 1739 by Wylie Hail, RN Outcome: Progressing 04/28/2023 1739 by Wylie Hail, RN Outcome: Progressing   Problem: Ischemic Stroke/TIA Tissue Perfusion: Goal: Complications of ischemic stroke/TIA will be minimized 04/28/2023 1739 by Wylie Hail, RN Outcome: Progressing 04/28/2023 1739 by Wylie Hail, RN Outcome: Progressing   Problem: Coping: Goal: Will verbalize positive feelings about self 04/28/2023 1739 by Wylie Hail, RN Outcome: Progressing 04/28/2023 1739 by Wylie Hail, RN Outcome: Progressing Goal: Will identify appropriate support needs 04/28/2023 1739 by Wylie Hail, RN Outcome: Progressing 04/28/2023 1739 by Wylie Hail, RN Outcome: Progressing   Problem: Health Behavior/Discharge Planning: Goal: Ability to manage health-related needs will improve 04/28/2023 1739 by Wylie Hail, RN Outcome: Progressing 04/28/2023 1739 by Wylie Hail, RN Outcome: Progressing Goal: Goals will be collaboratively established with patient/family 04/28/2023 1739 by Wylie Hail, RN Outcome: Progressing 04/28/2023 1739 by Wylie Hail, RN Outcome: Progressing   Problem: Self-Care: Goal: Ability to participate in self-care as condition permits will improve 04/28/2023 1739 by Wylie Hail, RN Outcome: Progressing 04/28/2023 1739 by Wylie Hail, RN Outcome: Progressing Goal: Verbalization of feelings and concerns over difficulty with self-care will improve 04/28/2023 1739 by  Wylie Hail, RN Outcome: Progressing 04/28/2023 1739 by Wylie Hail, RN Outcome: Progressing Goal: Ability to communicate needs accurately will improve 04/28/2023 1739 by Wylie Hail, RN Outcome: Progressing 04/28/2023 1739 by Wylie Hail, RN Outcome: Progressing   Problem: Nutrition: Goal: Risk of aspiration will decrease 04/28/2023 1739 by Wylie Hail, RN Outcome: Progressing 04/28/2023 1739 by Wylie Hail, RN Outcome: Progressing Goal: Dietary intake will improve 04/28/2023 1739 by Wylie Hail, RN Outcome: Progressing 04/28/2023 1739 by Wylie Hail, RN Outcome: Progressing   Problem: Education: Goal: Knowledge of General Education information will improve Description: Including pain rating scale, medication(s)/side effects and non-pharmacologic comfort measures 04/28/2023 1739 by Wylie Hail, RN Outcome: Progressing 04/28/2023 1739 by Wylie Hail, RN Outcome: Progressing   Problem: Health Behavior/Discharge Planning: Goal: Ability to manage health-related needs will improve 04/28/2023 1739 by Wylie Hail, RN Outcome: Progressing 04/28/2023 1739 by Wylie Hail, RN Outcome: Progressing   Problem: Clinical Measurements: Goal: Ability to maintain clinical measurements within normal limits will improve 04/28/2023 1739 by Wylie Hail, RN Outcome: Progressing 04/28/2023 1739 by Wylie Hail, RN Outcome: Progressing Goal: Will remain free from infection 04/28/2023 1739 by Wylie Hail, RN Outcome: Progressing 04/28/2023 1739 by Wylie Hail, RN Outcome: Progressing Goal: Diagnostic test results will improve 04/28/2023 1739 by Wylie Hail, RN Outcome: Progressing 04/28/2023 1739 by Wylie Hail, RN Outcome: Progressing Goal: Respiratory complications will improve 04/28/2023 1739 by Wylie Hail, RN Outcome: Progressing 04/28/2023 1739 by Wylie Hail, RN Outcome: Progressing Goal: Cardiovascular complication will be  avoided 04/28/2023 1739 by Wylie Hail, RN Outcome: Progressing 04/28/2023 1739 by Wylie Hail, RN Outcome: Progressing   Problem: Activity: Goal: Risk for activity intolerance will decrease 04/28/2023 1739 by Wylie Hail, RN Outcome: Progressing 04/28/2023 1739 by  Wylie Hail, RN Outcome: Progressing   Problem: Nutrition: Goal: Adequate nutrition will be maintained 04/28/2023 1739 by Wylie Hail, RN Outcome: Progressing 04/28/2023 1739 by Wylie Hail, RN Outcome: Progressing   Problem: Coping: Goal: Level of anxiety will decrease 04/28/2023 1739 by Wylie Hail, RN Outcome: Progressing 04/28/2023 1739 by Wylie Hail, RN Outcome: Progressing   Problem: Elimination: Goal: Will not experience complications related to bowel motility 04/28/2023 1739 by Wylie Hail, RN Outcome: Progressing 04/28/2023 1739 by Wylie Hail, RN Outcome: Progressing Goal: Will not experience complications related to urinary retention 04/28/2023 1739 by Wylie Hail, RN Outcome: Progressing 04/28/2023 1739 by Wylie Hail, RN Outcome: Progressing   Problem: Pain Managment: Goal: General experience of comfort will improve and/or be controlled 04/28/2023 1739 by Wylie Hail, RN Outcome: Progressing 04/28/2023 1739 by Wylie Hail, RN Outcome: Progressing   Problem: Safety: Goal: Ability to remain free from injury will improve 04/28/2023 1739 by Wylie Hail, RN Outcome: Progressing 04/28/2023 1739 by Wylie Hail, RN Outcome: Progressing   Problem: Skin Integrity: Goal: Risk for impaired skin integrity will decrease 04/28/2023 1739 by Wylie Hail, RN Outcome: Progressing 04/28/2023 1739 by Wylie Hail, RN Outcome: Progressing

## 2023-04-28 NOTE — Progress Notes (Signed)
Progress Note   Patient: Brittany Mullins [facility] ONG:[id number] DOB: 29-Aug-1980 DOA: 04/22/2023     6 DOS: the patient was seen and examined on 04/28/2023   Brief hospital course: 42yo history of Addison's disease, benign brain tumor, hypertension, history of pancreatitis, PCOS, pituitary tumor, seizures, opiate dependence on methadone, bipolar disorder, THC use presented to the ED with right-sided weakness and numbness, right facial numbness with some dysarthric speech. CT angiogram head and neck negative for LVO. MRI consistent with a acute left pontine infarct. Patient admitted for stroke workup. Neurology consulted.   Assessment and Plan: #1 acute left pontine infarct -Patient presented with right-sided upper extremity weakness and numbness with right facial weakness as well as right facial numbness which started at 12 PM the day prior to admission.  Patient not a tPA candidate due to delayed presentation. -CT angiogram head and neck done with no acute abnormality noted, no LVO, no evidence of significant stenosis in the neck. -MRI brain done positive for acute left pontine infarct. -2D echo with normal LVEF, no atrial level shunts -Lower extremity Dopplers neg for DVT -Placed on Norvasc 5 mg daily, Cozaar 25 mg daily for BP control and uptitrate as needed for better blood pressure control.  -LDL obtained at 97 and as such patient started on Lipitor 40 mg daily with goal LDL < 70. -Hypercoagulable panel ordered by neurology. -Neurology consulted. Recs for ASA 81mg  and plavix 75mg  daily x 3 weeks, then ASA alone -awaiting possible d/c to rehab. CIR cont to follow   2.  History of adrenal insufficiency/Addison's disease -Patient noted with a history of Addison's disease and per patient and boyfriend used to be on prednisone in the past however did not like the way it made her feel and she subsequently discontinued taking it. -Patient states she was in the process of establishing with a PCP  in order to get a referral to endocrinology in the outpatient setting. -Patient over the past month has been having intermittent symptoms of nausea vomiting lightheadedness and dizziness and just not feeling well. -Patient noted not to be orthostatic initially however had received fluid boluses in the ED prior to orthostatics being checked.   -Initial potassium on admission was elevated.   -Patient received a bolus of normal saline currently on IV fluids.  -Patient started on hydrocortisone 10 mg every morning and 5 mg with supper.   -Supportive care.   -Will need outpatient follow-up with endocrinology.  -Pt underwent MRI pituitary that was ordered several months ago outpatient, not ordered this admission. Reviewed. Empty sella, otherwise no appreciable pituitary mass noted   3.  Hypokalemia -Replaced   4.  Dehydration -given IV fluids.   5.  History of gastritis/duodenitis/history of peptic ulcer disease per patient -Transition from IV PPI to oral PPI daily.     6.  Opioid dependence -Resumed home regimen methadone.   7.  Headache -Patient given a dose of IV Compazine 5 mg followed by morphine 2 mg IV times with some improvement with headache yesterday however still with ongoing headache.   -IV Compazine as needed, IV morphine as needed, will also defer to neurology.     8.  History of seizures -Continue home regimen Keppra.   9.  Hypertension -Started Norvasc 5 mg daily, Cozaar 25 mg daily and uptitrate as needed for better blood pressure control. -BP remains stable  10. Tender nodule behind L ear -tender, firm, mobile nodule behind L ear noted. Not erythematous -Suspect possible reactive post-auricular node  given location -pt and family reports that pt is recovering from URI -Recent CT and MR unrevealing -Will check dedicated soft tissue neck xray neg -cont analgesia and heat to area for now.      Subjective: Without issues this AM  Physical Exam: Vitals:   04/27/23  1155 04/27/23 1955 04/28/23 0021 04/28/23 0342  BP: (!) 158/90 (!) 182/87 131/72 129/85  Pulse: 84 92 73 74  Resp: 17 18 18 16   Temp: 98.5 F (36.9 C) 98.8 F (37.1 C) 97.6 F (36.4 C) 97.7 F (36.5 C)  TempSrc: Oral Oral Oral Oral  SpO2: 99% 97% 96% 97%  Weight:      Height:       General exam: Awake, laying in bed, in nad Respiratory system: Normal respiratory effort, no wheezing Cardiovascular system: regular rate, s1, s2 Gastrointestinal system: Soft, nondistended, positive BS Central nervous system: CN2-12 grossly intact, strength intact Extremities: Perfused, no clubbing Skin: Normal skin turgor, no notable skin lesions seen Psychiatry: Mood normal // no visual hallucinations   Data Reviewed:  Labs reviewed: Na 135, K 4.2, Cr 0.89, WBC 8.4, Hgb 14.2  Family Communication: Pt in room, family not at bedside  Disposition: Status is: Inpatient Remains inpatient appropriate because: severity of illness  Planned Discharge Destination: Rehab    Author: Rickey Barbara, MD 04/28/2023 5:13 PM  For on call review [email or web address].

## 2023-04-28 NOTE — Plan of Care (Deleted)

## 2023-04-28 NOTE — Progress Notes (Signed)
[facility] night cross cover note:   I was notified by RN that this patient , who is hospitalized with acute ischemic left pontine CVA with residual right-sided weakness, was initially experiencing some stiffness on her right side, with ensuing development of report of stiffness involving her left side after dose of Flexeril and pain pill. She subsequently describes her left-sided stiffness as weakness. repeat CT head ordered to further assess.   Patient reports ensuing spontaneous resolution of the left lower extremity weakness, and significant spontaneous improvement in the left upper extremity weakness, and now reporting that strength in the left extremity is near baseline.   CT head, per radiology read, showed a questionable loss of gray/white matter differentiation along the right frontal lobe,, concerning for potential acute infarct, while showing no evidence of acute intracranial hemorrhage.  On exam, patient with residual right upper extremity weakness stemming from her presenting acute left pontine stroke. 5/5 strength in bilateral lower extremities. 4+/5 strength in the left upper extremity.  The patient is currently on dual antiplatelet therapy, and had undergone full stroke workup at the time of her initial presentation 6 days ago for acute left pontine stroke.   I subsequently discussed patient's case and imaging with the on-call neurologist, Dr. Otelia Limes, who agreed with pursuing MRI brain. Dr. Otelia Limes personally reviewed this updated CT head, and did not feel that there is evidence of acute intracranial process. Dr. Otelia Limes recommends continuation of dual antiplatelet therapy, and to re-contract the neurology service if the MRI brain is positive.  Not a candidate for tPA given rapid improvement in the patient's symptoms.  MRI brain w/o contrast has been ordered.     Newton Pigg, DO Hospitalist

## 2023-04-29 ENCOUNTER — Inpatient Hospital Stay (HOSPITAL_COMMUNITY): Payer: Medicare Other

## 2023-04-29 DIAGNOSIS — R531 Weakness: Secondary | ICD-10-CM | POA: Diagnosis not present

## 2023-04-29 DIAGNOSIS — I639 Cerebral infarction, unspecified: Secondary | ICD-10-CM | POA: Diagnosis not present

## 2023-04-29 MED ORDER — PROMETHAZINE HCL 25 MG PO TABS
25.0000 mg | ORAL_TABLET | Freq: Four times a day (QID) | ORAL | Status: DC | PRN
  Administered 2023-04-29 – 2023-05-01 (×2): 25 mg via ORAL
  Filled 2023-04-29 (×3): qty 1

## 2023-04-29 MED ORDER — GABAPENTIN 300 MG PO CAPS
300.0000 mg | ORAL_CAPSULE | Freq: Three times a day (TID) | ORAL | Status: DC
  Administered 2023-04-29 – 2023-05-01 (×7): 300 mg via ORAL
  Filled 2023-04-29 (×8): qty 1

## 2023-04-29 MED ORDER — LORAZEPAM 2 MG/ML IJ SOLN
1.0000 mg | Freq: Once | INTRAMUSCULAR | Status: DC
  Filled 2023-04-29: qty 1

## 2023-04-29 NOTE — Progress Notes (Signed)
Pt called out at 2240 feeling that the right side of her body was "tight".  HS meds and PRN Percocet and PRN Flexeril given without issues.  Warm blanket applied to right side of body.    Boyfriend then called out at 2315 for someone to come check on pt.  Pt was then noted to have stiffness to the left side of her body.  Unable to make a fist or bend her elbow on her left arm.  Left leg also stiff and toes pointed downward.  She also stated that her left arm felt "funny".  RRT notified and call made to Dr. Marilynn Rail.  Stat CT head ordered.  By the time we went down to CT, pt was calm but still unable to bend her left elbow or make a fist.  Boyfriend stated that this had some appearances of prior seizures, but didn't think it was a seizure.  Pt escorted to and from CT via bed with RN and RRT RN without issues.  By the time we returned, pt's left leg was back to "normal" and left arm was still stiff.      04/28/23 2325 04/28/23 2345  Vitals  Temp 99.1 F (37.3 C)  --   Temp Source Oral  --   BP (!) 172/110 (!) 142/101  MAP (mmHg) 128 115  BP Location Right Arm Right Arm  BP Method Automatic Automatic  Patient Position (if appropriate) Lying Lying  Pulse Rate  --  86  Pulse Rate Source  --  Dinamap  ECG Heart Rate (!) 101 95  Resp  --  20  Level of Consciousness  Level of Consciousness  --  Alert  MEWS COLOR  MEWS Score Color Green Green  Oxygen Therapy  SpO2 99 % 99 %  O2 Device Nasal Cannula Nasal Cannula  O2 Flow Rate (L/min) 2 L/min 2 L/min  MEWS Score  MEWS Temp 0 0  MEWS Systolic 0 0  MEWS Pulse 1 0  MEWS RR 0 0  MEWS LOC 0 0  MEWS Score 1 0   Bear Stearns BSN RN [facility] 04/29/2023, 12:54 AM

## 2023-04-29 NOTE — Progress Notes (Signed)
Inpatient Rehab Admissions Coordinator:  Saw pt and family at bedside. Pt has ongoing medical workup pending. Will continue to follow.   Wolfgang Phoenix, MS, CCC-SLP Admissions Coordinator [phone]

## 2023-04-29 NOTE — Progress Notes (Signed)
Per Dr. Arlean Hopping, plan for routine MRI.  Pre med with Ativan prior to MRI. Hilton Sinclair BSN RN CMSRN 04/29/2023, 4:10 AM

## 2023-04-29 NOTE — Plan of Care (Signed)
Problem: Education: Goal: Knowledge of disease or condition will improve 04/29/2023 1753 by Wylie Hail, RN Outcome: Progressing 04/29/2023 1752 by Wylie Hail, RN Outcome: Progressing Goal: Knowledge of secondary prevention will improve (MUST DOCUMENT ALL) 04/29/2023 1753 by Wylie Hail, RN Outcome: Progressing 04/29/2023 1752 by Wylie Hail, RN Outcome: Progressing Goal: Knowledge of patient specific risk factors will improve (DELETE if not current risk factor) 04/29/2023 1753 by Wylie Hail, RN Outcome: Progressing 04/29/2023 1752 by Wylie Hail, RN Outcome: Progressing   Problem: Ischemic Stroke/TIA Tissue Perfusion: Goal: Complications of ischemic stroke/TIA will be minimized 04/29/2023 1753 by Wylie Hail, RN Outcome: Progressing 04/29/2023 1752 by Wylie Hail, RN Outcome: Progressing   Problem: Coping: Goal: Will verbalize positive feelings about self 04/29/2023 1753 by Wylie Hail, RN Outcome: Progressing 04/29/2023 1752 by Wylie Hail, RN Outcome: Progressing Goal: Will identify appropriate support needs 04/29/2023 1753 by Wylie Hail, RN Outcome: Progressing 04/29/2023 1752 by Wylie Hail, RN Outcome: Progressing   Problem: Health Behavior/Discharge Planning: Goal: Ability to manage health-related needs will improve 04/29/2023 1753 by Wylie Hail, RN Outcome: Progressing 04/29/2023 1752 by Wylie Hail, RN Outcome: Progressing Goal: Goals will be collaboratively established with patient/family 04/29/2023 1753 by Wylie Hail, RN Outcome: Progressing 04/29/2023 1752 by Wylie Hail, RN Outcome: Progressing   Problem: Self-Care: Goal: Ability to participate in self-care as condition permits will improve 04/29/2023 1753 by Wylie Hail, RN Outcome: Progressing 04/29/2023 1752 by Wylie Hail, RN Outcome: Progressing Goal: Verbalization of feelings and concerns over difficulty with self-care will improve 04/29/2023 1753 by  Wylie Hail, RN Outcome: Progressing 04/29/2023 1752 by Wylie Hail, RN Outcome: Progressing Goal: Ability to communicate needs accurately will improve 04/29/2023 1753 by Wylie Hail, RN Outcome: Progressing 04/29/2023 1752 by Wylie Hail, RN Outcome: Progressing   Problem: Nutrition: Goal: Risk of aspiration will decrease 04/29/2023 1753 by Wylie Hail, RN Outcome: Progressing 04/29/2023 1752 by Wylie Hail, RN Outcome: Progressing Goal: Dietary intake will improve 04/29/2023 1753 by Wylie Hail, RN Outcome: Progressing 04/29/2023 1752 by Wylie Hail, RN Outcome: Progressing   Problem: Education: Goal: Knowledge of General Education information will improve Description: Including pain rating scale, medication(s)/side effects and non-pharmacologic comfort measures 04/29/2023 1753 by Wylie Hail, RN Outcome: Progressing 04/29/2023 1752 by Wylie Hail, RN Outcome: Progressing   Problem: Health Behavior/Discharge Planning: Goal: Ability to manage health-related needs will improve 04/29/2023 1753 by Wylie Hail, RN Outcome: Progressing 04/29/2023 1752 by Wylie Hail, RN Outcome: Progressing   Problem: Clinical Measurements: Goal: Ability to maintain clinical measurements within normal limits will improve 04/29/2023 1753 by Wylie Hail, RN Outcome: Progressing 04/29/2023 1752 by Wylie Hail, RN Outcome: Progressing Goal: Will remain free from infection 04/29/2023 1753 by Wylie Hail, RN Outcome: Progressing 04/29/2023 1752 by Wylie Hail, RN Outcome: Progressing Goal: Diagnostic test results will improve 04/29/2023 1753 by Wylie Hail, RN Outcome: Progressing 04/29/2023 1752 by Wylie Hail, RN Outcome: Progressing Goal: Respiratory complications will improve 04/29/2023 1753 by Wylie Hail, RN Outcome: Progressing 04/29/2023 1752 by Wylie Hail, RN Outcome: Progressing Goal: Cardiovascular complication will be  avoided 04/29/2023 1753 by Wylie Hail, RN Outcome: Progressing 04/29/2023 1752 by Wylie Hail, RN Outcome: Progressing   Problem: Activity: Goal: Risk for activity intolerance will decrease 04/29/2023 1753 by Wylie Hail, RN Outcome: Progressing 04/29/2023 1752 by  Wylie Hail, RN Outcome: Progressing   Problem: Nutrition: Goal: Adequate nutrition will be maintained 04/29/2023 1753 by Wylie Hail, RN Outcome: Progressing 04/29/2023 1752 by Wylie Hail, RN Outcome: Progressing   Problem: Coping: Goal: Level of anxiety will decrease 04/29/2023 1753 by Wylie Hail, RN Outcome: Progressing 04/29/2023 1752 by Wylie Hail, RN Outcome: Progressing   Problem: Elimination: Goal: Will not experience complications related to bowel motility 04/29/2023 1753 by Wylie Hail, RN Outcome: Progressing 04/29/2023 1752 by Wylie Hail, RN Outcome: Progressing Goal: Will not experience complications related to urinary retention 04/29/2023 1753 by Wylie Hail, RN Outcome: Progressing 04/29/2023 1752 by Wylie Hail, RN Outcome: Progressing   Problem: Pain Managment: Goal: General experience of comfort will improve and/or be controlled 04/29/2023 1753 by Wylie Hail, RN Outcome: Progressing 04/29/2023 1752 by Wylie Hail, RN Outcome: Progressing   Problem: Safety: Goal: Ability to remain free from injury will improve 04/29/2023 1753 by Wylie Hail, RN Outcome: Progressing 04/29/2023 1752 by Wylie Hail, RN Outcome: Progressing   Problem: Skin Integrity: Goal: Risk for impaired skin integrity will decrease 04/29/2023 1753 by Wylie Hail, RN Outcome: Progressing 04/29/2023 1752 by Wylie Hail, RN Outcome: Progressing

## 2023-04-29 NOTE — Progress Notes (Signed)
LTM EEG hooked up and running - no initial skin breakdown - push button tested - [facility] monitoring.

## 2023-04-29 NOTE — Progress Notes (Signed)
Pt's eyebrow piercing and tongue piercing removed by pt's boyfriend and placed in a denture cup.  He has them at bedside. Hilton Sinclair BSN RN CMSRN 04/29/2023, 6:57 AM

## 2023-04-29 NOTE — Progress Notes (Signed)
Occupational Therapy Treatment Patient Details Name: Brittany Mullins MRN: [id number] DOB: 16-Feb-1981 Today's Date: 04/29/2023   History of present illness 43 y.o. female who presented to ED 04/22/23 with right sided weakness, facial numbness and asymmetry. 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 L sided stiffness on 1/26, head CT 1/26 with questionable loss of gray-white matter differentiation along R frontal lobe, concern for acute infarction. PMHx Addison's disease, brain tumor (benign), Hypertension, Marijuana use, Pancreatitis, PCOS (polycystic ovarian syndrome), Pituitary tumor, and Seizures.   OT comments  Pt with new L sided weakness this session, needs up to max A for ADLs, CGA for bed mobility and min A for transfers with 1 person HHA. Pt able to open L hand with incr time and form fist, unable to perform AROM/PROM to elbow, and pt able to minimally abduct L shoulder. Pt educated on exercises for LUE and also provided with theraputty exercises for RUE strengthening. Pt presenting with impairments listed below, will follow acutely. Patient will benefit from intensive inpatient follow up therapy, >3 hours/day to maximize safety/ind with ADL/functional mobility.       If plan is discharge home, recommend the following:  A lot of help with walking and/or transfers;A lot of help with bathing/dressing/bathroom;Assistance with cooking/housework;Direct supervision/assist for medications management;Direct supervision/assist for financial management;Help with stairs or ramp for entrance;Assist for transportation;Supervision due to cognitive status   Equipment Recommendations  Other (comment) (defer)    Recommendations for Other Services PT consult;Rehab consult    Precautions / Restrictions Precautions Precautions: Fall Precaution Comments: watch BP Restrictions Weight Bearing Restrictions Per Provider Order: No       Mobility Bed Mobility Overal bed mobility: Needs  Assistance Bed Mobility: Supine to Sit     Supine to sit: Contact guard          Transfers Overall transfer level: Needs assistance Equipment used: Rolling walker (2 wheels) Transfers: Sit to/from Stand, Bed to chair/wheelchair/BSC Sit to Stand: Min assist Stand pivot transfers: Min assist               Balance Overall balance assessment: Needs assistance Sitting-balance support: Bilateral upper extremity supported, Feet supported Sitting balance-Leahy Scale: Fair     Standing balance support: Bilateral upper extremity supported, During functional activity, Reliant on assistive device for balance Standing balance-Leahy Scale: Poor Standing balance comment: reliant on external support                           ADL either performed or assessed with clinical judgement   ADL Overall ADL's : Needs assistance/impaired     Grooming: Moderate assistance;Sitting Grooming Details (indicate cue type and reason): oral care sitting at sink Upper Body Bathing: Moderate assistance   Lower Body Bathing: Maximal assistance   Upper Body Dressing : Moderate assistance   Lower Body Dressing: Maximal assistance   Toilet Transfer: Minimal assistance;Stand-pivot;BSC/3in1   Toileting- Clothing Manipulation and Hygiene: Minimal assistance       Functional mobility during ADLs: Minimal assistance      Extremity/Trunk Assessment Upper Extremity Assessment Upper Extremity Assessment: Right hand dominant;RUE deficits/detail;LUE deficits/detail RUE Deficits / Details: 3+/5 distal vs proximal weakness, can composite flex/ext digits and can sustain grasp for ~1 min during functional task RUE Sensation: decreased light touch RUE Coordination: decreased fine motor;decreased gross motor LUE Deficits / Details: pt able to make a first and release with incr time, minimal wrist movement, keeps in mildly flexed position. Keeps elbow bent 90*,  no AROM or PROM, can abduct shoulder  ~30* LUE Coordination: decreased fine motor;decreased gross motor   Lower Extremity Assessment Lower Extremity Assessment: Defer to PT evaluation        Vision   Additional Comments: pt reports blurred vision in R eye when L eye occluded, vision in L eye less blurred with R eye occluded. Pt squinting L eye frequently during session   Perception Perception Perception: Not tested   Praxis Praxis Praxis: Not tested    Cognition Arousal: Alert Behavior During Therapy: WFL for tasks assessed/performed, Anxious Overall Cognitive Status: Within Functional Limits for tasks assessed                                          Exercises Other Exercises Other Exercises: standing functional reach x10 wtih RUE Other Exercises: grasp release LUE x3 Other Exercises: reviewed putty exercises and provided with handout'    Shoulder Instructions       General Comments VSS on RA, significant other present    Pertinent Vitals/ Pain       Pain Assessment Pain Assessment: Faces Pain Score: 2  Faces Pain Scale: Hurts a little bit Pain Location: LUE with ROM Pain Descriptors / Indicators: Grimacing, Guarding Pain Intervention(s): Limited activity within patient's tolerance, Monitored during session, Repositioned  Home Living                                          Prior Functioning/Environment              Frequency  Min 1X/week        Progress Toward Goals  OT Goals(current goals can now be found in the care plan section)  Progress towards OT goals: Progressing toward goals  Acute Rehab OT Goals Patient Stated Goal: none stated OT Goal Formulation: With patient Time For Goal Achievement: 05/07/23 Potential to Achieve Goals: Good ADL Goals Pt Will Perform Upper Body Dressing: with contact guard assist;sitting Pt Will Perform Lower Body Dressing: with min assist;sitting/lateral leans;sit to/from stand Pt Will Transfer to Toilet: with  min assist;ambulating;regular height toilet Pt/caregiver will Perform Home Exercise Program: Right Upper extremity;With Supervision;With written HEP provided  Plan      Co-evaluation                 AM-PAC OT "6 Clicks" Daily Activity     Outcome Measure   Help from another person eating meals?: A Little Help from another person taking care of personal grooming?: A Little Help from another person toileting, which includes using toliet, bedpan, or urinal?: A Lot Help from another person bathing (including washing, rinsing, drying)?: A Lot Help from another person to put on and taking off regular upper body clothing?: A Little Help from another person to put on and taking off regular lower body clothing?: A Lot 6 Click Score: 15    End of Session Equipment Utilized During Treatment: Gait belt  OT Visit Diagnosis: Unsteadiness on feet (R26.81);Other abnormalities of gait and mobility (R26.89);Muscle weakness (generalized) (M62.81)   Activity Tolerance Patient tolerated treatment well   Patient Left in chair;with call bell/phone within reach;with chair alarm set;with family/visitor present   Nurse Communication          Time: 0454-0981 OT Time Calculation (min): 38 min  Charges: OT General Charges $OT Visit: 1 Visit OT Treatments $Self Care/Home Management : 8-22 mins $Therapeutic Activity: 8-22 mins $Therapeutic Exercise: 8-22 mins  Carver Fila, OTD, OTR/L SecureChat Preferred Acute Rehab [phone]   Dalphine Handing 04/29/2023, 12:33 PM

## 2023-04-29 NOTE — TOC Progression Note (Signed)
Transition of Care [facility]) - Progression Note    Patient Details  Name: Brittany Mullins MRN: [id number] Date of Birth: 09-May-1980  Transition of Care [facility]) CM/SW Contact  Kermit Balo, RN Phone Number: 04/29/2023, 2:22 PM  Clinical Narrative:     Pt to be placed on LTEEG. Plan is for CIR when medically ready. TOC following.  Expected Discharge Plan: IP Rehab Facility Barriers to Discharge: Continued Medical Work up  Expected Discharge Plan and Services   Discharge Planning Services: CM Consult Post Acute Care Choice: IP Rehab Living arrangements for the past 2 months: Apartment                                       Social Determinants of Health (SDOH) Interventions SDOH Screenings   Food Insecurity: No Food Insecurity (04/22/2023)  Housing: High Risk (04/22/2023)  Transportation Needs: No Transportation Needs (04/22/2023)  Utilities: Not At Risk (04/22/2023)  Tobacco Use: Low Risk  (04/25/2023)    Readmission Risk Interventions     No data to display

## 2023-04-29 NOTE — Progress Notes (Signed)
Progress Note   Patient: Brittany Mullins [facility] ZOX:[id number] DOB: May 16, 1980 DOA: 04/22/2023     7 DOS: the patient was seen and examined on 04/29/2023   Brief hospital course: 42yo history of Addison's disease, benign brain tumor, hypertension, history of pancreatitis, PCOS, pituitary tumor, seizures, opiate dependence on methadone, bipolar disorder, THC use presented to the ED with right-sided weakness and numbness, right facial numbness with some dysarthric speech. CT angiogram head and neck negative for LVO. MRI consistent with a acute left pontine infarct. Patient admitted for stroke workup. Neurology consulted.   Assessment and Plan: #1 acute left pontine infarct -Patient presented with right-sided upper extremity weakness and numbness with right facial weakness as well as right facial numbness which started at 12 PM the day prior to admission.  Patient not a tPA candidate due to delayed presentation. -CT angiogram head and neck done with no acute abnormality noted, no LVO, no evidence of significant stenosis in the neck. -MRI brain done positive for acute left pontine infarct. -2D echo with normal LVEF, no atrial level shunts -Lower extremity Dopplers neg for DVT -Placed on Norvasc 5 mg daily, Cozaar 25 mg daily for BP control and uptitrate as needed for better blood pressure control.  -LDL obtained at 97 and as such patient started on Lipitor 40 mg daily with goal LDL < 70. -Hypercoagulable panel ordered by neurology. -Neurology consulted. Recs for ASA 81mg  and plavix 75mg  daily x 3 weeks, then ASA alone -Overnight had increased L sided weakness, see overnight note. Discussed with Stroke team. Recs for repeat CT and LTM EEG, rule out seizure   2.  History of adrenal insufficiency/Addison's disease -Patient noted with a history of Addison's disease and per patient and boyfriend used to be on prednisone in the past however did not like the way it made her feel and she subsequently  discontinued taking it. -Patient states she was in the process of establishing with a PCP in order to get a referral to endocrinology in the outpatient setting. -Patient over the past month has been having intermittent symptoms of nausea vomiting lightheadedness and dizziness and just not feeling well. -Patient noted not to be orthostatic initially however had received fluid boluses in the ED prior to orthostatics being checked.   -Initial potassium on admission was elevated.   -Patient received a bolus of normal saline currently on IV fluids.  -Patient started on hydrocortisone 10 mg every morning and 5 mg with supper.   -Supportive care.   -Will need outpatient follow-up with endocrinology.  -Pt underwent MRI pituitary that was ordered several months ago outpatient, not ordered this admission. Reviewed. Empty sella, otherwise no appreciable pituitary mass noted   3.  Hypokalemia -Replaced   4.  Dehydration -given IV fluids.   5.  History of gastritis/duodenitis/history of peptic ulcer disease per patient -Transition from IV PPI to oral PPI daily.     6.  Opioid dependence -Resumed home regimen methadone.   7.  Headache -Patient given a dose of IV Compazine 5 mg followed by morphine 2 mg IV times with some improvement with headache yesterday however still with ongoing headache.   -IV Compazine as needed, IV morphine as needed, will also defer to neurology.     8.  History of seizures -Continue home regimen Keppra.   9.  Hypertension -Started Norvasc 5 mg daily, Cozaar 25 mg daily and uptitrate as needed for better blood pressure control. -BP remains stable  10. Tender nodule behind L ear -tender,  firm, mobile nodule behind L ear noted. Not erythematous -Suspect possible reactive post-auricular node given location -Recent CT and MR unrevealing -soft tissue neck xray reviewed, neg -cont analgesia and heat to area for now.      Subjective: Complains of tender nodule behind L  ear  Physical Exam: Vitals:   04/29/23 0355 04/29/23 0736 04/29/23 1112 04/29/23 1535  BP: 130/81 128/84 (!) 142/92 128/77  Pulse: 71 68 77 76  Resp: 18     Temp: (!) 97.5 F (36.4 C) 97.7 F (36.5 C) 97.7 F (36.5 C) 98.5 F (36.9 C)  TempSrc: Oral Oral Oral Oral  SpO2: 98% 98% 95% 96%  Weight:      Height:       General exam: Awake, laying in bed, in nad Respiratory system: Normal respiratory effort, no wheezing Cardiovascular system: regular rate, s1, s2 Gastrointestinal system: Soft, nondistended, positive BS Central nervous system: 4/5 L sided UE strength, LE seems intact Extremities: Perfused, no clubbing Skin: Normal skin turgor, no notable skin lesions seen Psychiatry: Mood normal // no visual hallucinations   Data Reviewed:  There are no new results to review at this time.  Family Communication: Pt in room, family not at bedside  Disposition: Status is: Inpatient Remains inpatient appropriate because: severity of illness  Planned Discharge Destination: Rehab    Author: Rickey Barbara, MD 04/29/2023 5:20 PM  For on call review [email or web address].

## 2023-04-29 NOTE — Plan of Care (Signed)

## 2023-04-29 NOTE — Progress Notes (Signed)
Dr. Arlean Hopping in to see pt.   Hilton Sinclair BSN RN CMSRN 04/29/2023, 1:51 AM

## 2023-04-29 NOTE — Progress Notes (Signed)
STROKE TEAM PROGRESS NOTE   BRIEF HPI [name] is a 43 y.o. female with PMH significant for HTN, Seizures, Benign Brain Tumor, Addison's Disease, Pituatory Tumor, PCOS, Marijuana Use who presented to ED 1/20 d/t numbness to face, right arm and leg, right arm and leg weakness, right facial droop. Patient stated that symptoms started 1/19 and got progressively worse. Also endorsed right calf pain x 1 week.  MRI showed Left Pontine Infarct. CTA showed No LVO. No interventions d/t outside of window.   NIH on Admission: 6  INTERIM HISTORY/SUBJECTIVE Stroke team was asked to reevaluate patient because of new symptoms of left arm weakness and stiffness this morning.  History provided by patient's fianc is like this yesterday noticed 2 episodes where patient is staring and briefly unresponsive.  This morning she had some stiffness and drying of the left upper extremity which she is unable to relax.  There is no witnessed tonic-clonic seizure activity noted. There is no change in her right-sided deficits. CT head done yesterday shows questionable loss of gray-white junction in the right frontal lobe concerning for acute infarction. OBJECTIVE  CBC    Component Value Date/Time   WBC 8.4 04/28/2023 0642   RBC 4.54 04/28/2023 0642   HGB 14.2 04/28/2023 0642   HGB 12.0 06/12/2017 1203   HCT 41.2 04/28/2023 0642   HCT 36.2 06/12/2017 1203   PLT 278 04/28/2023 0642   PLT 204 06/12/2017 1203   MCV 90.7 04/28/2023 0642   MCV 92 06/12/2017 1203   MCH 31.3 04/28/2023 0642   MCHC 34.5 04/28/2023 0642   RDW 12.3 04/28/2023 0642   RDW 13.1 06/12/2017 1203   LYMPHSABS 2.3 04/22/2023 0752   MONOABS 0.6 04/22/2023 0752   EOSABS 0.1 04/22/2023 0752   BASOSABS 0.0 04/22/2023 0752    BMET    Component Value Date/Time   NA 135 04/28/2023 0642   NA 139 06/12/2017 1203   K 4.2 04/28/2023 0642   CL 101 04/28/2023 0642   CO2 25 04/28/2023 0642   GLUCOSE 87 04/28/2023 0642   BUN 16  04/28/2023 0642   BUN 5 (L) 06/12/2017 1203   CREATININE 0.89 04/28/2023 0642   CALCIUM 9.0 04/28/2023 0642   GFRNONAA >60 04/28/2023 0642    IMAGING past 24 hours CT HEAD WO CONTRAST ( ) Result Date: 04/29/2023 CLINICAL DATA:  Neuro deficit, acute, stroke suspected EXAM: CT HEAD WITHOUT CONTRAST TECHNIQUE: Contiguous axial images were obtained from the base of the skull through the vertex without intravenous contrast. RADIATION DOSE REDUCTION: This exam was performed according to the departmental dose-optimization program which includes automated exposure control, adjustment of the mA and/or kV according to patient size and/or use of iterative reconstruction technique. COMPARISON:  MRI head 04/25/2023. FINDINGS: Brain: Question loss of gray-white matter differentiation along the right frontal lobe (3:22, 6:29). No evidence of large-territorial acute infarction. No parenchymal hemorrhage. No mass lesion. No extra-axial collection. No mass effect or midline shift. No hydrocephalus. Basilar cisterns are patent. Partial empty sella. Vascular: No hyperdense vessel. Skull: No acute fracture or focal lesion. Sinuses/Orbits: Paranasal sinuses and mastoid air cells are clear. The orbits are unremarkable. Other: None. IMPRESSION: 1. Question loss of gray-white matter differentiation along the right frontal lobe-concern for acute infarction. Consider MRI head without contrast for further evaluation. 2. No acute intracranial hemorrhage. These results will be called to the ordering clinician or representative by the Radiologist Assistant, and communication documented in the PACS or [employer]. Electronically Signed  By: Tish Frederickson M.D.   On: 04/29/2023 00:04    Vitals:   04/29/23 0355 04/29/23 0736 04/29/23 1112 04/29/23 1535  BP: 130/81 128/84 (!) 142/92 128/77  Pulse: 71 68 77 76  Resp: 18     Temp: (!) 97.5 F (36.4 C) 97.7 F (36.5 C) 97.7 F (36.5 C) 98.5 F (36.9 C)  TempSrc: Oral Oral  Oral Oral  SpO2: 98% 98% 95% 96%  Weight:      Height:         PHYSICAL EXAM General:  Alert, well-nourished, well-developed patient in no acute distress Psych:  Mood and affect appropriate for situation CV: Regular rate and rhythm on monitor Respiratory:  Regular, unlabored respirations on room air GI: Abdomen soft and nontender   NEURO:  Mental Status: AA&Ox3, patient is able to give clear and coherent history Speech/Language: mild dysarthria.  Naming, repetition, fluency, and comprehension intact.  Cranial Nerves:  II: PERRL. Visual fields full.  III, IV, VI: EOMI. Eyelids elevate symmetrically.  V: Sensation is intact to light touch and symmetrical to face.  VII: Face is symmetrical resting and smiling VIII: hearing intact to voice. IX, X: Palate elevates symmetrically. Phonation is normal.  ZO:[id number] shrug 5/5. XII: tongue is midline without fasciculations. Motor: Right arm 4/5 with drift, right leg 3/5 with drift right upper extremity has increased tone with flexion at the left wrist and fingers patient has pain when trying to passively relax.  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  CTA head & neck: No LVO  MRI  Acute Left Pontine Infarct VAS [place] LE DVT: Negative 2D Echo EF 60 to 65% LDL 94 HgbA1c 5.0 Hypercoagulable workup pending UDS positive for THC VTE prophylaxis - lovenox No antithrombotic prior to admission, now on aspirin 81mg  and  clopidogrel 75 mg daily for 3 weeks then Asprin alone  Therapy recommendations:  CIR Disposition: Pending  Seizures Home meds: Keppra 500 mg every 8 hours, continued Reported frequency seizure at home.  Patient states she usually has 1-2 seizures  a month but can have as many as 1-2 seizures a week. Patient states she gets aura before seizures, does not remember full episodes No seizure-like activity noted during admission Continue home Keppra 500 every 8 hours Patient stated that she follows with neurology locally but no documentation in chart Recommend to follow up with Dr. Karel Jarvis at [facility]  Hypertension No home meds Multiple ED visits note hypertension BP fluctuate during hospitalization On Norvasc 5 and Cozaar 25 Long-term BP goal: Normotensive  Hyperlipidemia Home meds:  none LDL 94, goal < 70 Follow Lipitor 40mg   Continue statin at discharge  Substance Abuse Patient uses Marijuana UDS positive for  THC  Home Meds: Methadone (due to pain and nausea from chronic pancreatitis)      Ready to quit? No TOC consult for cessation placed  Other Stroke Risk Factors On estrogen for PCOS  Other Active Problems Hx of Pituitary Tumor, has Follow-up scheduled with Neurosurgery Addison's disease on hydrocortisone History of PCOS On estrogen treatment for 3 years Off treatment for few months due to no doctor, recently placed back on treatments  Hospital day # 7  Patient has had some neurological worsening since yesterday.  CT scan raises question of right frontal white matter hypodensity concerning for acute stroke.  However clinical exam suggests left upper extremity hypertonia flexion with patient having prior history of seizures raising concern for focal seizures.  Check EEG and will put on long-term EEG monitoring.  Repeat CT scan with and without contrast later today.  Long discussion with patient and boyfriend/family at bedside answered questions.  Discussed with Dr. Rhona Leavens.  Greater than 50% time during this 50-minute visit was spent in counseling and coordination of care and discussion patient and care team and answering questions. Delia Heady, MD Stroke Neurology 04/29/2023 5:18 PM     To contact Stroke Continuity  provider, please refer to [email or web address]. After hours, contact General Neurology

## 2023-04-29 NOTE — Progress Notes (Signed)
Pt awakened for reassessment.  She states her left leg is back to normal and her left arm is "almost" normal.  She is able to make a fist and bend her elbow normally.  Dr. Marilynn Rail made aware that CT results are back. Hilton Sinclair BSN RN CMSRN 04/29/2023, 1:04 AM

## 2023-04-30 ENCOUNTER — Inpatient Hospital Stay (HOSPITAL_COMMUNITY): Payer: Medicare Other

## 2023-04-30 DIAGNOSIS — I6300 Cerebral infarction due to thrombosis of unspecified precerebral artery: Secondary | ICD-10-CM | POA: Diagnosis not present

## 2023-04-30 DIAGNOSIS — N179 Acute kidney failure, unspecified: Secondary | ICD-10-CM | POA: Diagnosis not present

## 2023-04-30 DIAGNOSIS — R531 Weakness: Secondary | ICD-10-CM | POA: Diagnosis not present

## 2023-04-30 DIAGNOSIS — R569 Unspecified convulsions: Secondary | ICD-10-CM | POA: Diagnosis not present

## 2023-04-30 DIAGNOSIS — M79661 Pain in right lower leg: Secondary | ICD-10-CM | POA: Diagnosis not present

## 2023-04-30 DIAGNOSIS — I639 Cerebral infarction, unspecified: Secondary | ICD-10-CM | POA: Diagnosis not present

## 2023-04-30 LAB — CBC
HCT: 38.4 % (ref 36.0–46.0)
Hemoglobin: 13.1 g/dL (ref 12.0–15.0)
MCH: 31.3 pg (ref 26.0–34.0)
MCHC: 34.1 g/dL (ref 30.0–36.0)
MCV: 91.9 fL (ref 80.0–100.0)
Platelets: 253 10*3/uL (ref 150–400)
RBC: 4.18 MIL/uL (ref 3.87–5.11)
RDW: 12.5 % (ref 11.5–15.5)
WBC: 10.1 10*3/uL (ref 4.0–10.5)
nRBC: 0 % (ref 0.0–0.2)

## 2023-04-30 LAB — COMPREHENSIVE METABOLIC PANEL
ALT: 25 U/L (ref 0–44)
AST: 27 U/L (ref 15–41)
Albumin: 3.1 g/dL — ABNORMAL LOW (ref 3.5–5.0)
Alkaline Phosphatase: 43 U/L (ref 38–126)
Anion gap: 9 (ref 5–15)
BUN: 23 mg/dL — ABNORMAL HIGH (ref 6–20)
CO2: 25 mmol/L (ref 22–32)
Calcium: 9 mg/dL (ref 8.9–10.3)
Chloride: 104 mmol/L (ref 98–111)
Creatinine, Ser: 0.73 mg/dL (ref 0.44–1.00)
GFR, Estimated: >60 mL/min (ref >60)
Glucose, Bld: 97 mg/dL (ref 70–99)
Potassium: 4.3 mmol/L (ref 3.5–5.1)
Sodium: 138 mmol/L (ref 135–145)
Total Bilirubin: 0.3 mg/dL (ref 0.0–1.2)
Total Protein: 6 g/dL — ABNORMAL LOW (ref 6.5–8.1)

## 2023-04-30 LAB — PROTHROMBIN GENE MUTATION

## 2023-04-30 LAB — FACTOR 5 LEIDEN

## 2023-04-30 MED ORDER — LEVETIRACETAM 750 MG PO TABS
750.0000 mg | ORAL_TABLET | Freq: Three times a day (TID) | ORAL | Status: DC
  Administered 2023-04-30 – 2023-05-01 (×3): 750 mg via ORAL
  Filled 2023-04-30 (×2): qty 1

## 2023-04-30 NOTE — Plan of Care (Signed)

## 2023-04-30 NOTE — Progress Notes (Addendum)
Inpatient Rehabilitation Admissions Coordinator   Noted LT EEG and repeat CT scan pending. Patient notes that she has allergy to the dye. Dr Rhona Leavens and Dr Pearlean Brownie made aware. I await medical clearance to admit to CIR.  Ottie Glazier, RN, MSN Rehab Admissions Coordinator [phone] 04/30/2023 10:19 AM  I will follow up tomorrow.  Ottie Glazier, RN, MSN Rehab Admissions Coordinator [phone] 04/30/2023 1:09 PM

## 2023-04-30 NOTE — Progress Notes (Signed)
STROKE TEAM PROGRESS NOTE   BRIEF HPI [name] is a 43 y.o. female with PMH significant for HTN, Seizures, Benign Brain Tumor, Addison's Disease, Pituatory Tumor, PCOS, Marijuana Use who presented to ED 1/20 d/t numbness to face, right arm and leg, right arm and leg weakness, right facial droop. Patient stated that symptoms started 1/19 and got progressively worse. Also endorsed right calf pain x 1 week.  MRI showed Left Pontine Infarct. CTA showed No LVO. No interventions d/t outside of window.   NIH on Admission: 6  INTERIM HISTORY/SUBJECTIVE Patient's boyfriend/fianc is at bedside.  She states she is doing better today she is able to move the left arm a bit now and does not have as much increased tone.  She is complaining of left shoulder pain and is wondering if it got dislocated.  There were no witnessed focal seizure activities.  Overnight video EEG recording showed no evidence of definite seizures.  Case discussed with epileptologist Dr. Melynda Ripple who  recommends increasing dose of Keppra OBJECTIVE  CBC    Component Value Date/Time   WBC 10.1 04/30/2023 0550   RBC 4.18 04/30/2023 0550   HGB 13.1 04/30/2023 0550   HGB 12.0 06/12/2017 1203   HCT 38.4 04/30/2023 0550   HCT 36.2 06/12/2017 1203   PLT 253 04/30/2023 0550   PLT 204 06/12/2017 1203   MCV 91.9 04/30/2023 0550   MCV 92 06/12/2017 1203   MCH 31.3 04/30/2023 0550   MCHC 34.1 04/30/2023 0550   RDW 12.5 04/30/2023 0550   RDW 13.1 06/12/2017 1203   LYMPHSABS 2.3 04/22/2023 0752   MONOABS 0.6 04/22/2023 0752   EOSABS 0.1 04/22/2023 0752   BASOSABS 0.0 04/22/2023 0752    BMET    Component Value Date/Time   NA 138 04/30/2023 0550   NA 139 06/12/2017 1203   K 4.3 04/30/2023 0550   CL 104 04/30/2023 0550   CO2 25 04/30/2023 0550   GLUCOSE 97 04/30/2023 0550   BUN 23 (H) 04/30/2023 0550   BUN 5 (L) 06/12/2017 1203   CREATININE 0.73 04/30/2023 0550   CALCIUM 9.0 04/30/2023 0550   GFRNONAA >60  04/30/2023 0550    IMAGING past 24 hours VAS [place] LOWER EXTREMITY VENOUS (DVT) Result Date: 04/30/2023  Lower Venous DVT Study Patient Name:  Brittany Mullins [facility]  Date of Exam:   04/30/2023 Medical Rec #: [id number]                   Accession #:    [id number] Date of Birth: 1981/03/10                   Patient Gender: F Patient Age:   42 years Exam Location:  [facility] Procedure:      VAS [place] LOWER EXTREMITY VENOUS (DVT) Referring Phys: STEPHEN CHIU --------------------------------------------------------------------------------  Indications: Pain.  Risk Factors: None identified. Comparison Study: No significant changes seen since previous exam 04/23/23. Performing Technologist: Shona Simpson  Examination Guidelines: A complete evaluation includes B-mode imaging, spectral Doppler, color Doppler, and power Doppler as needed of all accessible portions of each vessel. Bilateral testing is considered an integral part of a complete examination. Limited examinations for reoccurring indications may be performed as noted. The reflux portion of the exam is performed with the patient in reverse Trendelenburg.  +---------+---------------+---------+-----------+----------+--------------+ RIGHT    CompressibilityPhasicitySpontaneityPropertiesThrombus Aging +---------+---------------+---------+-----------+----------+--------------+ CFV      Full           Yes  Yes                                 +---------+---------------+---------+-----------+----------+--------------+ SFJ      Full                                                        +---------+---------------+---------+-----------+----------+--------------+ FV Prox  Full                                                        +---------+---------------+---------+-----------+----------+--------------+ FV Mid   Full                                                         +---------+---------------+---------+-----------+----------+--------------+ FV DistalFull                                                        +---------+---------------+---------+-----------+----------+--------------+ PFV      Full                                                        +---------+---------------+---------+-----------+----------+--------------+ POP      Full           Yes      Yes                                 +---------+---------------+---------+-----------+----------+--------------+ PTV      Full                                                        +---------+---------------+---------+-----------+----------+--------------+ PERO     Full                                                        +---------+---------------+---------+-----------+----------+--------------+   +----+---------------+---------+-----------+----------+--------------+ LEFTCompressibilityPhasicitySpontaneityPropertiesThrombus Aging +----+---------------+---------+-----------+----------+--------------+ CFV Full           Yes      Yes                                 +----+---------------+---------+-----------+----------+--------------+    Summary: RIGHT: - There is no evidence of deep vein thrombosis in the lower extremity.  -  No cystic structure found in the popliteal fossa.  LEFT: - No evidence of common femoral vein obstruction.   *See table(s) above for measurements and observations.    Preliminary    Overnight EEG with video Result Date: 04/30/2023 Charlsie Quest, MD     04/30/2023 10:23 AM Patient Name: Brittany Mullins MRN: [id number] Epilepsy Attending: Charlsie Quest Referring Physician/Provider: Mathews Argyle, NP Duration: 1/27/205 1523 to 04/30/2023 1015 Patient history: 43 year old female with a PMHx significant for seizures (on Keppra) and HTN who presents to the emergency department for evaluation of acute onset of right face, arm and leg numbness  with weakness. EEG to evaluate for seizure. Level of alertness: Awake, asleep AEDs during EEG study: LEV, GBP Technical aspects: This EEG study was done with scalp electrodes positioned according to the 10-20 International system of electrode placement. Electrical activity was reviewed with band pass filter of 1-70Hz , sensitivity of 7 uV/mm, display speed of 47mm/sec with a 60Hz  notched filter applied as appropriate. EEG data were recorded continuously and digitally stored.  Video monitoring was available and reviewed as appropriate. Description: The posterior dominant rhythm consists of 9 Hz activity of moderate voltage (25-35 uV) seen predominantly in posterior head regions, symmetric and reactive to eye opening and eye closing. Sleep was characterized by vertex waves, sleep spindles (12 to 14 Hz), maximal frontocentral region.  Hyperventilation and photic stimulation were not performed.   IMPRESSION: This study is within normal limits. No seizures or epileptiform discharges were seen throughout the recording. A normal interictal EEG does not exclude the diagnosis of epilepsy. Charlsie Quest    Vitals:   04/29/23 2330 04/30/23 0414 04/30/23 0841 04/30/23 1240  BP: 138/89 122/82 115/70 130/81  Pulse: 78 66 67 75  Resp: 19 18 17 16   Temp: 98 F (36.7 C) 97.8 F (36.6 C) 98.3 F (36.8 C) 98.2 F (36.8 C)  TempSrc: Oral Oral Oral Oral  SpO2: 96% 99% 97% 98%  Weight:      Height:         PHYSICAL EXAM General:  Alert, well-nourished, well-developed patient in no acute distress Psych:  Mood and affect appropriate for situation CV: Regular rate and rhythm on monitor Respiratory:  Regular, unlabored respirations on room air GI: Abdomen soft and nontender   NEURO:  Mental Status: AA&Ox3, patient is able to give clear and coherent history Speech/Language: mild dysarthria.  Naming, repetition, fluency, and comprehension intact.  Cranial Nerves:  II: PERRL. Visual fields full.  III, IV, VI:  EOMI. Eyelids elevate symmetrically.  V: Sensation is intact to light touch and symmetrical to face.  VII: Face is symmetrical resting and smiling VIII: hearing intact to voice. IX, X: Palate elevates symmetrically. Phonation is normal.  [id number] shrug 5/5. XII: tongue is midline without fasciculations. Motor: Right arm 4/5 with drift, right leg 3/5 with drift right upper extremity slight increased tone in left upper extremity but now grade 2/5 strength.  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  CTA head & neck: No LVO  MRI  Acute Left Pontine Infarct VAS [place] LE DVT: Negative 2D Echo EF 60 to 65% LDL 94  HgbA1c 5.0 Hypercoagulable workup pending UDS positive for THC VTE prophylaxis - lovenox No antithrombotic prior to admission, now on aspirin 81mg  and  clopidogrel 75 mg daily for 3 weeks then Asprin alone  Therapy recommendations:  CIR Disposition: Pending  Seizures Home meds: Keppra 500 mg every 8 hours, plan to increase to 750 mg reported frequency seizure at home.  Patient states she usually has 1-2 seizures a month but can have as many as 1-2 seizures a week. Patient states she gets aura before seizures, does not remember full episodes No seizure-like activity noted during admission Continue home Keppra 500 every 8 hours Patient stated that she follows with neurology locally but no documentation in chart Recommend to follow up [facility]  Hypertension No home meds Multiple ED visits note hypertension BP fluctuate during hospitalization On Norvasc 5 and Cozaar 25 Long-term BP goal:  Normotensive  Hyperlipidemia Home meds:  none LDL 94, goal < 70 Follow Lipitor 40mg   Continue statin at discharge  Substance Abuse Patient uses Marijuana UDS positive for  THC  Home Meds: Methadone (due to pain and nausea from chronic pancreatitis)      Ready to quit? No TOC consult for cessation placed  Other Stroke Risk Factors On estrogen for PCOS  Other Active Problems Hx of Pituitary Tumor, has Follow-up scheduled with Neurosurgery Addison's disease on hydrocortisone History of PCOS On estrogen treatment for 3 years Off treatment for few months due to no doctor, recently placed back on treatments  Hospital day # 8  Patient has shown some neurological improvement in left upper extremity tone and movements since yesterday.  Follow-up CT scan to view  right frontal white matter hypodensity concerning for acute stroke is pending.Marland Kitchen  However clinical exam suggests left upper extremity hypertonia flexion with patient having prior history of seizures raising concern for focal seizures.    long-term EEG monitoring does not show any ongoing seizures..   Plan to increase Keppra dose to 750 mg every 8 hourly..  Long discussion with patient and boyfriend/family at bedside answered questions.  Discussed with Dr. Rhona Leavens.  Greater than 50% time during this 35-minute visit was spent in counseling and coordination of care and discussion patient and care team and answering questions.  Stroke team will sign off.  Can call for questions. Delia Heady, MD Stroke Neurology 04/30/2023 2:52 PM     To contact Stroke Continuity provider, please refer to [email or web address]. After hours, contact General Neurology

## 2023-04-30 NOTE — Care Management Important Message (Signed)
Important Message  Patient Details  Name: Brittany Mullins MRN: [id number] Date of Birth: 02-Apr-1981   Important Message Given:  Yes - Medicare IM     Dorena Bodo 04/30/2023, 4:15 PM

## 2023-04-30 NOTE — Plan of Care (Signed)

## 2023-04-30 NOTE — Progress Notes (Signed)
LTM EEG discontinued - no skin breakdown at [facility].

## 2023-04-30 NOTE — Progress Notes (Signed)
Physical Therapy Treatment Patient Details Name: Brittany Mullins MRN: [id number] DOB: [birth year] Today's Date: 04/30/2023   History of Present Illness 43 y.o. female who presented to ED 04/22/23 with right sided weakness, facial numbness and asymmetry. 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 L sided stiffness on 1/26, head CT 1/26 with questionable loss of gray-white matter differentiation along R frontal lobe, concern for acute infarction. PMHx Addison's disease, brain tumor (benign), Hypertension, Marijuana use, Pancreatitis, PCOS (polycystic ovarian syndrome), Pituitary tumor, and Seizures.    PT Comments  Pt resting in bed on arrival and eager for mobility. Pt with L elbow flexed throughout session, unable to extend and painful with passive ROM attempting extension (of note all imaging from today of elbow and shoulder normal). Pt agreeable to trial of L platform RW with good return, with pt able to complete x2 gait bouts in room with mod A fading to min A with increased distance. Pt continues to need cues for upright trunk and increased abduction of RLE during swing phase. Pt continues to be motivated and current plan remains appropriate to address deficits and maximize functional independence and decrease caregiver burden. Pt continues to benefit from skilled PT services to progress toward functional mobility goals.       If plan is discharge home, recommend the following: A lot of help with walking and/or transfers;A lot of help with bathing/dressing/bathroom;Assistance with cooking/housework;Assistance with feeding;Direct supervision/assist for medications management;Direct supervision/assist for financial management;Assist for transportation;Help with stairs or ramp for entrance;Supervision due to cognitive status   Can travel by private vehicle        Equipment Recommendations  Rolling walker (2 wheels);BSC/3in1    Recommendations for Other Services       Precautions /  Restrictions Precautions Precautions: Fall Precaution Comments: watch BP Restrictions Weight Bearing Restrictions Per Provider Order: No     Mobility  Bed Mobility Overal bed mobility: Needs Assistance Bed Mobility: Supine to Sit     Supine to sit: Contact guard     General bed mobility comments: CGA for safety with increased time    Transfers Overall transfer level: Needs assistance Equipment used: Left platform walker, None Transfers: Sit to/from Stand, Bed to chair/wheelchair/BSC Sit to Stand: Min assist, Contact guard assist Stand pivot transfers: Min assist         General transfer comment: min A to boost and steady on rise with initial stand to PFRW, fading to CGA with practice, min A to step pivot to chair at end of session without UE support    Ambulation/Gait Ambulation/Gait assistance: Mod assist, Min assist Gait Distance (Feet): 24 Feet (x2) Assistive device: Left platform walker Gait Pattern/deviations: Step-to pattern, Decreased stance time - right, Decreased step length - right, Decreased dorsiflexion - right, Decreased weight shift to right, Ataxic Gait velocity: decr     General Gait Details: PFRW utilized as pt with flexed L elbow and unable to extend arm to use standard RW, mod A fading to min A with distance to steady and manage RW, cues for wider BOS as pt with contineud RLE adduction during swing phase   Stairs             Wheelchair Mobility     Tilt Bed    Modified Rankin (Stroke Patients Only) Modified Rankin (Stroke Patients Only) Pre-Morbid Rankin Score: No symptoms Modified Rankin: Moderately severe disability     Balance Overall balance assessment: Needs assistance Sitting-balance support: Bilateral upper extremity supported, Feet supported Sitting balance-Leahy Scale:  Fair Sitting balance - Comments: frequent moving when seated EOB needing CGA for safety'   Standing balance support: Bilateral upper extremity supported,  During functional activity, Reliant on assistive device for balance Standing balance-Leahy Scale: Poor Standing balance comment: reliant on external support                            Cognition Arousal: Alert Behavior During Therapy: WFL for tasks assessed/performed, Anxious Overall Cognitive Status: Within Functional Limits for tasks assessed                                 General Comments: improvmement with impulsive movements        Exercises General Exercises - Lower Extremity Long Arc Quad: AROM, Right, Seated, 10 reps, Left Hip Flexion/Marching: AROM, 10 reps, Left, Seated, Right Toe Raises: AROM, Left, 10 reps Heel Raises: AROM, Left, Right, 10 reps, Seated    General Comments General comments (skin integrity, edema, etc.): VSS on RA, significant other present      Pertinent Vitals/Pain Pain Assessment Pain Assessment: Faces Faces Pain Scale: Hurts a little bit Pain Location: LUE with ROM Pain Descriptors / Indicators: Grimacing, Guarding Pain Intervention(s): Monitored during session, Limited activity within patient's tolerance    Home Living                          Prior Function            PT Goals (current goals can now be found in the care plan section) Acute Rehab PT Goals Patient Stated Goal: get mobility back PT Goal Formulation: With patient/family Time For Goal Achievement: 05/07/23 Progress towards PT goals: Progressing toward goals    Frequency    Min 1X/week      PT Plan      Co-evaluation              AM-PAC PT "6 Clicks" Mobility   Outcome Measure  Help needed turning from your back to your side while in a flat bed without using bedrails?: A Little Help needed moving from lying on your back to sitting on the side of a flat bed without using bedrails?: A Little Help needed moving to and from a bed to a chair (including a wheelchair)?: A Lot Help needed standing up from a chair using your  arms (e.g., wheelchair or bedside chair)?: A Little Help needed to walk in hospital room?: A Lot Help needed climbing 3-5 steps with a railing? : Total 6 Click Score: 14    End of Session Equipment Utilized During Treatment: Gait belt Activity Tolerance: Patient tolerated treatment well Patient left: with call bell/phone within reach;in chair;with family/visitor present Nurse Communication: Mobility status;Other (comment) (pt location up in chair) PT Visit Diagnosis: Unsteadiness on feet (R26.81);Other abnormalities of gait and mobility (R26.89);Muscle weakness (generalized) (M62.81);Difficulty in walking, not elsewhere classified (R26.2);Other symptoms and signs involving the nervous system (R29.898);Hemiplegia and hemiparesis Hemiplegia - Right/Left: Right Hemiplegia - dominant/non-dominant: Dominant Hemiplegia - caused by: Cerebral infarction     Time: 5621-3086 PT Time Calculation (min) (ACUTE ONLY): 26 min  Charges:    $Gait Training: 23-37 mins PT General Charges $$ ACUTE PT VISIT: 1 Visit                     Jiyan Walkowski R. PTA Acute Rehabilitation Services Office: [phone]  [employer] Lliam Hoh 04/30/2023, 4:42 PM

## 2023-04-30 NOTE — Progress Notes (Addendum)
LTM maint complete - no skin breakdown under:  C3 Fz F3 A1 Serviced leads [facility] monitored, Event button test confirmed by [facility].

## 2023-04-30 NOTE — Procedures (Signed)
Patient Name: Brittany Mullins  MRN: [id number]  Epilepsy Attending: Charlsie Quest  Referring Physician/Provider: Mathews Argyle, NP  Duration: 1/27/205 1523 to 04/30/2023 1427  Patient history: 43 year old female with a PMHx significant for seizures (on Keppra) and HTN who presents to the emergency department for evaluation of acute onset of right face, arm and leg numbness with weakness. EEG to evaluate for seizure.  Level of alertness: Awake, asleep  AEDs during EEG study: LEV, GBP  Technical aspects: This EEG study was done with scalp electrodes positioned according to the 10-20 International system of electrode placement. Electrical activity was reviewed with band pass filter of 1-70Hz , sensitivity of 7 uV/mm, display speed of 81mm/sec with a 60Hz  notched filter applied as appropriate. EEG data were recorded continuously and digitally stored.  Video monitoring was available and reviewed as appropriate.  Description: The posterior dominant rhythm consists of 9 Hz activity of moderate voltage (25-35 uV) seen predominantly in posterior head regions, symmetric and reactive to eye opening and eye closing. Sleep was characterized by vertex waves, sleep spindles (12 to 14 Hz), maximal frontocentral region.  Hyperventilation and photic stimulation were not performed.     IMPRESSION: This study is within normal limits. No seizures or epileptiform discharges were seen throughout the recording.  A normal interictal EEG does not exclude the diagnosis of epilepsy.  [name]

## 2023-04-30 NOTE — Progress Notes (Signed)
Lower extremity venous duplex completed. Please see CV Procedures for preliminary results.  Shona Simpson, RVT 04/30/23 2:02 PM

## 2023-04-30 NOTE — Progress Notes (Signed)
Progress Note   Patient: Brittany Mullins [facility] RUE:[id number] DOB: 02-Aug-1980 DOA: 04/22/2023     8 DOS: the patient was seen and examined on 04/30/2023   Brief hospital course: 42yo history of Addison's disease, benign brain tumor, hypertension, history of pancreatitis, PCOS, pituitary tumor, seizures, opiate dependence on methadone, bipolar disorder, THC use presented to the ED with right-sided weakness and numbness, right facial numbness with some dysarthric speech. CT angiogram head and neck negative for LVO. MRI consistent with a acute left pontine infarct. Patient admitted for stroke workup. Neurology consulted.   Assessment and Plan: #1 acute left pontine infarct -Patient presented with right-sided upper extremity weakness and numbness with right facial weakness as well as right facial numbness which started at 12 PM the day prior to admission.  Patient not a tPA candidate due to delayed presentation. -CT angiogram head and neck done with no acute abnormality noted, no LVO, no evidence of significant stenosis in the neck. -MRI brain done positive for acute left pontine infarct. -2D echo with normal LVEF, no atrial level shunts -Lower extremity Dopplers neg for DVT -Placed on Norvasc 5 mg daily, Cozaar 25 mg daily for BP control -LDL obtained at 97 and as such patient started on Lipitor 40 mg daily with goal LDL < 70. -Hypercoagulable panel ordered by neurology notable for prot S of 48 (nl 60-160). -Neurology consulted. Recs for ASA 81mg  and plavix 75mg  daily x 3 weeks, then ASA alone -Overnight had increased L sided weakness, see overnight note. Discussed with Stroke team. Recs for repeat CT and LTM EEG, rule out seizure   2.  History of adrenal insufficiency/Addison's disease -Patient noted with a history of Addison's disease and per patient and boyfriend used to be on prednisone in the past however did not like the way it made her feel and she subsequently discontinued taking  it. -Patient states she was in the process of establishing with a PCP in order to get a referral to endocrinology in the outpatient setting. -Patient over the past month has been having intermittent symptoms of nausea vomiting lightheadedness and dizziness and just not feeling well. -recommend outpatient follow-up with endocrinology.  -Pt underwent MRI pituitary that was ordered several months ago outpatient, not ordered this admission. Reviewed. Empty sella, otherwise no appreciable pituitary mass noted   3.  Hypokalemia -Normalized   4.  Dehydration -resolved   5.  History of gastritis/duodenitis/history of peptic ulcer disease per patient -continue PPI   6.  Opioid dependence -Resumed home regimen methadone.   7.  Headache -Patient given a dose of IV Compazine 5 mg followed by morphine 2 mg IV times with some improvement with headache yesterday however still with ongoing headache.   -IV Compazine as needed, IV morphine as needed    8.  History of seizures -Continue  Keppra. -Recent concerns of recurrent seizures noted per Dr Pearlean Brownie -Keppra dose subsequently increased to 750mg    9.  Hypertension -Started Norvasc 5 mg daily, Cozaar 25 mg daily and uptitrate as needed for better blood pressure control. -BP remains stable  10. Tender nodule behind L ear -tender, firm, mobile nodule behind L ear noted. Not erythematous -Suspect possible reactive post-auricular node given location and in the setting of pt recovering from recent URI -Recent CT and MR unrevealing -soft tissue neck xray reviewed, neg -cont analgesia and heat to area for now.  Low protein S -discussed with hematology on call.  -Recommendation to repeat Prot S. Also check free antigen and total  antigen -Have placed ambulatory referral for Hematology for when pt discharges      Subjective: complains of thick greenish mucus production this AM  Physical Exam: Vitals:   04/30/23 0414 04/30/23 0841 04/30/23 1240  04/30/23 1625  BP: 122/82 115/70 130/81 (!) 149/86  Pulse: 66 67 75   Resp: 18 17 16 17   Temp: 97.8 F (36.6 C) 98.3 F (36.8 C) 98.2 F (36.8 C) 98.6 F (37 C)  TempSrc: Oral Oral Oral Oral  SpO2: 99% 97% 98% 97%  Weight:      Height:       General exam: Conversant, in no acute distress Respiratory system: normal chest rise, clear, no audible wheezing Cardiovascular system: regular rhythm, s1-s2 Gastrointestinal system: Nondistended, nontender, pos BS Central nervous system: No seizures, no tremors Extremities: No cyanosis, no joint deformities Skin: No rashes, no pallor Psychiatry: Affect normal // no auditory hallucinations   Data Reviewed:  Na 138, K 4.3, Cr 0.73, WBC 10.1, Hgb 13.1  Family Communication: Pt in room, family not at bedside  Disposition: Status is: Inpatient Remains inpatient appropriate because: severity of illness  Planned Discharge Destination: Rehab    Author: Rickey Barbara, MD 04/30/2023 4:35 PM  For on call review [email or web address].

## 2023-05-01 ENCOUNTER — Other Ambulatory Visit: Payer: Self-pay

## 2023-05-01 ENCOUNTER — Inpatient Hospital Stay (HOSPITAL_COMMUNITY)
Admission: AD | Admit: 2023-05-01 | Discharge: 2023-05-18 | DRG: 057 | Disposition: A | Discharge status: Home Health | Payer: Medicare Other | Source: Intra-hospital | Attending: Physical Medicine & Rehabilitation | Admitting: Physical Medicine & Rehabilitation

## 2023-05-01 ENCOUNTER — Encounter (HOSPITAL_COMMUNITY): Payer: Self-pay | Admitting: Physical Medicine & Rehabilitation

## 2023-05-01 ENCOUNTER — Encounter (HOSPITAL_COMMUNITY): Payer: Self-pay | Admitting: Internal Medicine

## 2023-05-01 DIAGNOSIS — G8929 Other chronic pain: Secondary | ICD-10-CM | POA: Diagnosis present

## 2023-05-01 DIAGNOSIS — K219 Gastro-esophageal reflux disease without esophagitis: Secondary | ICD-10-CM | POA: Diagnosis present

## 2023-05-01 DIAGNOSIS — Z833 Family history of diabetes mellitus: Secondary | ICD-10-CM

## 2023-05-01 DIAGNOSIS — I639 Cerebral infarction, unspecified: Principal | ICD-10-CM

## 2023-05-01 DIAGNOSIS — G40909 Epilepsy, unspecified, not intractable, without status epilepticus: Secondary | ICD-10-CM | POA: Diagnosis present

## 2023-05-01 DIAGNOSIS — E875 Hyperkalemia: Secondary | ICD-10-CM | POA: Diagnosis present

## 2023-05-01 DIAGNOSIS — F3160 Bipolar disorder, current episode mixed, unspecified: Secondary | ICD-10-CM | POA: Diagnosis present

## 2023-05-01 DIAGNOSIS — F319 Bipolar disorder, unspecified: Secondary | ICD-10-CM | POA: Diagnosis not present

## 2023-05-01 DIAGNOSIS — K625 Hemorrhage of anus and rectum: Secondary | ICD-10-CM | POA: Diagnosis present

## 2023-05-01 DIAGNOSIS — Z90710 Acquired absence of both cervix and uterus: Secondary | ICD-10-CM

## 2023-05-01 DIAGNOSIS — R400 Somnolence: Secondary | ICD-10-CM | POA: Diagnosis not present

## 2023-05-01 DIAGNOSIS — Z79899 Other long term (current) drug therapy: Secondary | ICD-10-CM

## 2023-05-01 DIAGNOSIS — F40240 Claustrophobia: Secondary | ICD-10-CM | POA: Diagnosis present

## 2023-05-01 DIAGNOSIS — M5412 Radiculopathy, cervical region: Secondary | ICD-10-CM | POA: Diagnosis not present

## 2023-05-01 DIAGNOSIS — F445 Conversion disorder with seizures or convulsions: Secondary | ICD-10-CM | POA: Diagnosis not present

## 2023-05-01 DIAGNOSIS — F1994 Other psychoactive substance use, unspecified with psychoactive substance-induced mood disorder: Secondary | ICD-10-CM | POA: Diagnosis not present

## 2023-05-01 DIAGNOSIS — R300 Dysuria: Secondary | ICD-10-CM | POA: Diagnosis not present

## 2023-05-01 DIAGNOSIS — Z91148 Patient's other noncompliance with medication regimen for other reason: Secondary | ICD-10-CM

## 2023-05-01 DIAGNOSIS — K861 Other chronic pancreatitis: Secondary | ICD-10-CM | POA: Diagnosis present

## 2023-05-01 DIAGNOSIS — R471 Dysarthria and anarthria: Secondary | ICD-10-CM | POA: Diagnosis present

## 2023-05-01 DIAGNOSIS — Z7902 Long term (current) use of antithrombotics/antiplatelets: Secondary | ICD-10-CM

## 2023-05-01 DIAGNOSIS — Z91041 Radiographic dye allergy status: Secondary | ICD-10-CM

## 2023-05-01 DIAGNOSIS — I69393 Ataxia following cerebral infarction: Secondary | ICD-10-CM

## 2023-05-01 DIAGNOSIS — M25512 Pain in left shoulder: Secondary | ICD-10-CM | POA: Diagnosis not present

## 2023-05-01 DIAGNOSIS — R2689 Other abnormalities of gait and mobility: Secondary | ICD-10-CM | POA: Diagnosis present

## 2023-05-01 DIAGNOSIS — I69312 Visuospatial deficit and spatial neglect following cerebral infarction: Secondary | ICD-10-CM

## 2023-05-01 DIAGNOSIS — Z8659 Personal history of other mental and behavioral disorders: Secondary | ICD-10-CM | POA: Diagnosis not present

## 2023-05-01 DIAGNOSIS — M4802 Spinal stenosis, cervical region: Secondary | ICD-10-CM | POA: Diagnosis present

## 2023-05-01 DIAGNOSIS — G629 Polyneuropathy, unspecified: Secondary | ICD-10-CM | POA: Diagnosis present

## 2023-05-01 DIAGNOSIS — G5621 Lesion of ulnar nerve, right upper limb: Secondary | ICD-10-CM | POA: Diagnosis not present

## 2023-05-01 DIAGNOSIS — F1290 Cannabis use, unspecified, uncomplicated: Secondary | ICD-10-CM | POA: Diagnosis not present

## 2023-05-01 DIAGNOSIS — R569 Unspecified convulsions: Secondary | ICD-10-CM | POA: Diagnosis not present

## 2023-05-01 DIAGNOSIS — D497 Neoplasm of unspecified behavior of endocrine glands and other parts of nervous system: Secondary | ICD-10-CM

## 2023-05-01 DIAGNOSIS — E876 Hypokalemia: Secondary | ICD-10-CM | POA: Diagnosis present

## 2023-05-01 DIAGNOSIS — E271 Primary adrenocortical insufficiency: Secondary | ICD-10-CM | POA: Diagnosis present

## 2023-05-01 DIAGNOSIS — K859 Acute pancreatitis without necrosis or infection, unspecified: Secondary | ICD-10-CM

## 2023-05-01 DIAGNOSIS — I10 Essential (primary) hypertension: Secondary | ICD-10-CM | POA: Diagnosis present

## 2023-05-01 DIAGNOSIS — I69398 Other sequelae of cerebral infarction: Secondary | ICD-10-CM | POA: Diagnosis not present

## 2023-05-01 DIAGNOSIS — I6302 Cerebral infarction due to thrombosis of basilar artery: Secondary | ICD-10-CM | POA: Diagnosis not present

## 2023-05-01 DIAGNOSIS — G4700 Insomnia, unspecified: Secondary | ICD-10-CM | POA: Diagnosis present

## 2023-05-01 DIAGNOSIS — F419 Anxiety disorder, unspecified: Secondary | ICD-10-CM | POA: Diagnosis present

## 2023-05-01 DIAGNOSIS — M79602 Pain in left arm: Secondary | ICD-10-CM

## 2023-05-01 DIAGNOSIS — M25511 Pain in right shoulder: Secondary | ICD-10-CM | POA: Diagnosis not present

## 2023-05-01 DIAGNOSIS — K8681 Exocrine pancreatic insufficiency: Secondary | ICD-10-CM | POA: Diagnosis present

## 2023-05-01 DIAGNOSIS — G894 Chronic pain syndrome: Secondary | ICD-10-CM | POA: Diagnosis present

## 2023-05-01 DIAGNOSIS — F1110 Opioid abuse, uncomplicated: Secondary | ICD-10-CM | POA: Diagnosis present

## 2023-05-01 DIAGNOSIS — I69351 Hemiplegia and hemiparesis following cerebral infarction affecting right dominant side: Secondary | ICD-10-CM | POA: Diagnosis not present

## 2023-05-01 DIAGNOSIS — K5900 Constipation, unspecified: Secondary | ICD-10-CM | POA: Diagnosis not present

## 2023-05-01 DIAGNOSIS — F3177 Bipolar disorder, in partial remission, most recent episode mixed: Secondary | ICD-10-CM | POA: Diagnosis not present

## 2023-05-01 DIAGNOSIS — Z8249 Family history of ischemic heart disease and other diseases of the circulatory system: Secondary | ICD-10-CM

## 2023-05-01 DIAGNOSIS — H539 Unspecified visual disturbance: Secondary | ICD-10-CM | POA: Diagnosis not present

## 2023-05-01 DIAGNOSIS — Z91030 Bee allergy status: Secondary | ICD-10-CM

## 2023-05-01 DIAGNOSIS — H905 Unspecified sensorineural hearing loss: Secondary | ICD-10-CM | POA: Diagnosis present

## 2023-05-01 DIAGNOSIS — M4312 Spondylolisthesis, cervical region: Secondary | ICD-10-CM | POA: Diagnosis present

## 2023-05-01 DIAGNOSIS — Z7982 Long term (current) use of aspirin: Secondary | ICD-10-CM

## 2023-05-01 DIAGNOSIS — F1129 Opioid dependence with unspecified opioid-induced disorder: Secondary | ICD-10-CM | POA: Diagnosis not present

## 2023-05-01 DIAGNOSIS — Z888 Allergy status to other drugs, medicaments and biological substances status: Secondary | ICD-10-CM

## 2023-05-01 MED ORDER — SENNOSIDES-DOCUSATE SODIUM 8.6-50 MG PO TABS
2.0000 | ORAL_TABLET | Freq: Every day | ORAL | Status: DC
  Administered 2023-05-02 – 2023-05-18 (×14): 2 via ORAL
  Filled 2023-05-01 (×14): qty 2

## 2023-05-01 MED ORDER — AMLODIPINE BESYLATE 5 MG PO TABS
5.0000 mg | ORAL_TABLET | Freq: Every day | ORAL | Status: DC
  Administered 2023-05-02 – 2023-05-18 (×17): 5 mg via ORAL
  Filled 2023-05-01 (×18): qty 1

## 2023-05-01 MED ORDER — ENOXAPARIN SODIUM 40 MG/0.4ML IJ SOSY
40.0000 mg | PREFILLED_SYRINGE | INTRAMUSCULAR | Status: DC
Start: 2023-05-01 — End: 2023-05-18
  Administered 2023-05-01 – 2023-05-17 (×17): 40 mg via SUBCUTANEOUS
  Filled 2023-05-01 (×17): qty 0.4

## 2023-05-01 MED ORDER — ALUM & MAG HYDROXIDE-SIMETH 200-200-20 MG/5ML PO SUSP: 15.0000 mL | Freq: Four times a day (QID) | ORAL | Status: DC | PRN

## 2023-05-01 MED ORDER — GABAPENTIN 300 MG PO CAPS: 300.0000 mg | ORAL_CAPSULE | Freq: Three times a day (TID) | ORAL | Status: DC

## 2023-05-01 MED ORDER — ATORVASTATIN CALCIUM 40 MG PO TABS: 40.0000 mg | ORAL_TABLET | Freq: Every day | ORAL | Status: DC

## 2023-05-01 MED ORDER — POLYETHYLENE GLYCOL 3350 17 G PO PACK: 17.0000 g | PACK | Freq: Every day | ORAL | Status: DC | PRN

## 2023-05-01 MED ORDER — METHADONE HCL 10 MG PO TABS
120.0000 mg | ORAL_TABLET | Freq: Every day | ORAL | Status: DC
  Administered 2023-05-02 – 2023-05-18 (×17): 120 mg via ORAL
  Filled 2023-05-01 (×18): qty 12

## 2023-05-01 MED ORDER — PROCHLORPERAZINE EDISYLATE 10 MG/2ML IJ SOLN
5.0000 mg | Freq: Four times a day (QID) | INTRAMUSCULAR | Status: DC | PRN
Start: 2023-05-01 — End: 2023-05-18
  Administered 2023-05-06: 10 mg via INTRAVENOUS
  Filled 2023-05-01: qty 2

## 2023-05-01 MED ORDER — GUAIFENESIN-DM 100-10 MG/5ML PO SYRP: 5.0000 mL | ORAL_SOLUTION | Freq: Four times a day (QID) | ORAL | Status: DC | PRN

## 2023-05-01 MED ORDER — PANTOPRAZOLE SODIUM 40 MG PO TBEC
40.0000 mg | DELAYED_RELEASE_TABLET | Freq: Every day | ORAL | Status: DC
  Administered 2023-05-02 – 2023-05-18 (×16): 40 mg via ORAL
  Filled 2023-05-01 (×16): qty 1

## 2023-05-01 MED ORDER — ACETAMINOPHEN 325 MG PO TABS
325.0000 mg | ORAL_TABLET | ORAL | Status: DC | PRN
  Administered 2023-05-02 – 2023-05-15 (×8): 650 mg via ORAL
  Filled 2023-05-01 (×8): qty 2

## 2023-05-01 MED ORDER — ASPIRIN 81 MG PO TBEC: 81.0000 mg | DELAYED_RELEASE_TABLET | Freq: Every day | ORAL | Status: DC

## 2023-05-01 MED ORDER — BISACODYL 10 MG RE SUPP: 10.0000 mg | Freq: Every day | RECTAL | Status: DC | PRN

## 2023-05-01 MED ORDER — PROCHLORPERAZINE 25 MG RE SUPP: 12.5000 mg | Freq: Four times a day (QID) | RECTAL | Status: DC | PRN

## 2023-05-01 MED ORDER — PROMETHAZINE HCL 25 MG PO TABS: 25.0000 mg | ORAL_TABLET | Freq: Four times a day (QID) | ORAL | Status: DC | PRN

## 2023-05-01 MED ORDER — LOSARTAN POTASSIUM 25 MG PO TABS
25.0000 mg | ORAL_TABLET | Freq: Every day | ORAL | Status: DC
  Administered 2023-05-02 – 2023-05-18 (×17): 25 mg via ORAL
  Filled 2023-05-01 (×18): qty 1

## 2023-05-01 MED ORDER — ASPIRIN 81 MG PO TBEC
81.0000 mg | DELAYED_RELEASE_TABLET | Freq: Every day | ORAL | Status: DC
  Administered 2023-05-02 – 2023-05-18 (×17): 81 mg via ORAL
  Filled 2023-05-01 (×18): qty 1

## 2023-05-01 MED ORDER — LEVETIRACETAM 750 MG PO TABS: 750.0000 mg | ORAL_TABLET | Freq: Three times a day (TID) | ORAL | Status: DC

## 2023-05-01 MED ORDER — ALBUTEROL SULFATE (2.5 MG/3ML) 0.083% IN NEBU: 2.5000 mg | INHALATION_SOLUTION | RESPIRATORY_TRACT | Status: DC | PRN

## 2023-05-01 MED ORDER — CLOPIDOGREL BISULFATE 75 MG PO TABS
75.0000 mg | ORAL_TABLET | Freq: Every day | ORAL | Status: DC
  Administered 2023-05-02 – 2023-05-16 (×15): 75 mg via ORAL
  Filled 2023-05-01 (×16): qty 1

## 2023-05-01 MED ORDER — PROCHLORPERAZINE MALEATE 5 MG PO TABS
5.0000 mg | ORAL_TABLET | Freq: Four times a day (QID) | ORAL | Status: DC | PRN
  Administered 2023-05-08 – 2023-05-12 (×4): 10 mg via ORAL
  Filled 2023-05-01 (×5): qty 2

## 2023-05-01 MED ORDER — NALOXONE HCL 0.4 MG/ML IJ SOLN: 0.4000 mg | INTRAMUSCULAR | Status: DC | PRN

## 2023-05-01 MED ORDER — CYCLOBENZAPRINE HCL 5 MG PO TABS
5.0000 mg | ORAL_TABLET | Freq: Three times a day (TID) | ORAL | Status: DC | PRN
  Administered 2023-05-02 – 2023-05-16 (×13): 5 mg via ORAL
  Filled 2023-05-01 (×12): qty 1

## 2023-05-01 MED ORDER — HYDROCORTISONE 5 MG PO TABS: ORAL_TABLET | ORAL | Status: DC

## 2023-05-01 MED ORDER — CALCIUM CARBONATE ANTACID 500 MG PO CHEW
2.0000 | CHEWABLE_TABLET | Freq: Once | ORAL | Status: AC
  Administered 2023-05-01: 400 mg via ORAL
  Filled 2023-05-01: qty 2

## 2023-05-01 MED ORDER — SORBITOL 70 % SOLN
60.0000 mL | Freq: Every day | Status: DC | PRN
  Filled 2023-05-01: qty 60

## 2023-05-01 MED ORDER — GABAPENTIN 300 MG PO CAPS
300.0000 mg | ORAL_CAPSULE | Freq: Three times a day (TID) | ORAL | Status: DC
  Administered 2023-05-01 – 2023-05-06 (×14): 300 mg via ORAL
  Filled 2023-05-01 (×15): qty 1

## 2023-05-01 MED ORDER — LOSARTAN POTASSIUM 25 MG PO TABS: 25.0000 mg | ORAL_TABLET | Freq: Every day | ORAL | Status: DC

## 2023-05-01 MED ORDER — PANTOPRAZOLE SODIUM 40 MG PO TBEC: 40.0000 mg | DELAYED_RELEASE_TABLET | Freq: Every day | ORAL | Status: DC

## 2023-05-01 MED ORDER — CLOPIDOGREL BISULFATE 75 MG PO TABS: 75.0000 mg | ORAL_TABLET | Freq: Every day | ORAL | Status: DC

## 2023-05-01 MED ORDER — HYDROCORTISONE 10 MG PO TABS
10.0000 mg | ORAL_TABLET | Freq: Every day | ORAL | Status: DC
  Administered 2023-05-02 – 2023-05-18 (×16): 10 mg via ORAL
  Filled 2023-05-01 (×18): qty 1

## 2023-05-01 MED ORDER — FLEET ENEMA RE ENEM: 1.0000 | ENEMA | Freq: Once | RECTAL | Status: DC | PRN

## 2023-05-01 MED ORDER — NALOXONE HCL 0.4 MG/ML IJ SOLN: 0.4000 mg | INTRAMUSCULAR | Status: AC | PRN

## 2023-05-01 MED ORDER — ATORVASTATIN CALCIUM 40 MG PO TABS
40.0000 mg | ORAL_TABLET | Freq: Every day | ORAL | Status: DC
  Administered 2023-05-02 – 2023-05-18 (×17): 40 mg via ORAL
  Filled 2023-05-01 (×18): qty 1

## 2023-05-01 MED ORDER — PROMETHAZINE HCL 25 MG RE SUPP: 25.0000 mg | Freq: Four times a day (QID) | RECTAL | Status: DC | PRN

## 2023-05-01 MED ORDER — LEVETIRACETAM 250 MG PO TABS
750.0000 mg | ORAL_TABLET | Freq: Three times a day (TID) | ORAL | Status: DC
  Administered 2023-05-01 – 2023-05-09 (×22): 750 mg via ORAL
  Filled 2023-05-01 (×22): qty 3

## 2023-05-01 MED ORDER — HYDROCORTISONE 5 MG PO TABS
5.0000 mg | ORAL_TABLET | Freq: Every day | ORAL | Status: DC
  Administered 2023-05-02 – 2023-05-17 (×16): 5 mg via ORAL
  Filled 2023-05-01 (×17): qty 1

## 2023-05-01 MED ORDER — TRAZODONE HCL 50 MG PO TABS
25.0000 mg | ORAL_TABLET | Freq: Every evening | ORAL | Status: DC | PRN
  Administered 2023-05-02 – 2023-05-03 (×2): 50 mg via ORAL
  Filled 2023-05-01 (×2): qty 1

## 2023-05-01 MED ORDER — BLOOD PRESSURE CONTROL BOOK
Freq: Once | Status: DC
  Filled 2023-05-01: qty 1

## 2023-05-01 MED ORDER — SENNA 8.6 MG PO TABS: 1.0000 | ORAL_TABLET | Freq: Two times a day (BID) | ORAL | Status: DC

## 2023-05-01 MED ORDER — LEVONORGESTREL-ETHINYL ESTRAD 0.1-20 MG-MCG PO TABS: 1.0000 | ORAL_TABLET | Freq: Every day | ORAL | Status: DC

## 2023-05-01 MED ORDER — ALUM & MAG HYDROXIDE-SIMETH 200-200-20 MG/5ML PO SUSP: 30.0000 mL | ORAL | Status: DC | PRN

## 2023-05-01 MED ORDER — AMLODIPINE BESYLATE 5 MG PO TABS: 5.0000 mg | ORAL_TABLET | Freq: Every day | ORAL | Status: DC

## 2023-05-01 MED ORDER — OXYCODONE-ACETAMINOPHEN 5-325 MG PO TABS
1.0000 | ORAL_TABLET | Freq: Four times a day (QID) | ORAL | Status: DC | PRN
Start: 2023-05-01 — End: 2023-05-07
  Administered 2023-05-02 – 2023-05-04 (×6): 2 via ORAL
  Administered 2023-05-05: 1 via ORAL
  Administered 2023-05-05: 2 via ORAL
  Administered 2023-05-06: 1 via ORAL
  Administered 2023-05-07: 2 via ORAL
  Filled 2023-05-01 (×2): qty 2
  Filled 2023-05-01: qty 1
  Filled 2023-05-01 (×7): qty 2
  Filled 2023-05-01: qty 1

## 2023-05-01 NOTE — Discharge Summary (Signed)
Physician Discharge Summary   Patient: Brittany Mullins MRN: [id number] DOB: [birth year]  Admit date:     04/22/2023  Discharge date: 05/01/23  Discharge Physician: Briant Cedar   PCP: Patient, No Pcp Per   Recommendations at discharge:   Follow-up with neurology Follow-up with heme-onc  Discharge Diagnoses: Principal Problem:   Stroke (cerebrum) (HCC) Active Problems:   Opioid dependence (HCC)   Hypokalemia   Dehydration   History of Addison's disease   Gastritis and gastroduodenitis   Intractable vomiting with nausea   Bipolar disorder (HCC)   Hyperkalemia   Dizziness   Acute right-sided weakness   Acute intractable headache   HTN (hypertension)   Hospital Course: 43 yo female with history of Addison's disease, benign brain tumor, hypertension, history of pancreatitis, PCOS, pituitary tumor, seizures, opiate dependence on methadone, bipolar disorder, THC use presented to the ED with right-sided weakness and numbness, right facial numbness with some dysarthric speech. CT angiogram head and neck negative for LVO. MRI consistent with a acute left pontine infarct. Patient admitted for stroke workup. Neurology consulted.  Patient is currently stable, discharged to CIR for further rehab needs.   Today, patient denies any new complaints except for ongoing reflux and requesting heating pad for ongoing bilateral shoulder pain.  Appeared comfortable, not in any distress   Assessment and Plan:  Acute left pontine infarct CT angiogram head and neck done with no acute abnormality noted, no LVO, no evidence of significant stenosis in the neck. MRI brain done positive for acute left pontine infarct. 2D echo with normal LVEF, no atrial level shunts Lower extremity Dopplers neg for DVT LDL 97, started on Lipitor 40 mg daily with goal LDL < 70 Hypercoagulable panel ordered by neurology notable for prot S of 48 (nl 60-160). Neurology consulted, recs for ASA 81mg  and  plavix 75mg  daily x 3 weeks, then ASA alone LTM EEG, ruled out seizure   History of adrenal insufficiency/Addison's disease Patient noted with a history of Addison's disease and per patient and boyfriend used to be on prednisone in the past however did not like the way it made her feel and she subsequently discontinued taking it. Recommend outpatient follow-up with endocrinology   History of gastritis/duodenitis/history of peptic ulcer disease per patient Continue PPI   Opioid dependence Resumed home regimen methadone   History of seizures Continue increased Keppra dose 750mg  every 8hrs   Hypertension Started Norvasc 5 mg daily, Cozaar 25 mg daily and uptitrate as needed for better blood pressure control   Tender nodule behind L ear Tender, firm, mobile nodule behind L ear noted. Not erythematous Recent CT and MR unrevealing Soft tissue neck xray reviewed, neg Cont analgesia and heat to area for now.   Low protein S Ambulatory referral for Hematology for when pt discharges       Consultants: Neurology Procedures performed: None Disposition: CIR Diet recommendation:  Cardiac diet   DISCHARGE MEDICATION: Allergies as of 05/01/2023       Reactions   Bee Venom Anaphylaxis   Toradol [ketorolac Tromethamine] Shortness Of Breath   Benadryl [diphenhydramine] Other (See Comments)   States she cannot breathe        Medication List     TAKE these medications    acetaminophen 325 MG tablet Commonly known as: TYLENOL Take 2 tablets (650 mg total) by mouth every 6 (six) hours as needed for mild pain (or Fever >/= 101).   albuterol 108 (90 Base) MCG/ACT inhaler Commonly known as: VENTOLIN  HFA Inhale 1-2 puffs into the lungs every 6 (six) hours as needed for wheezing or shortness of breath.   alum & mag hydroxide-simeth 200-200-20 MG/5ML suspension Commonly known as: MAALOX/MYLANTA Take 15 mLs by mouth every 6 (six) hours as needed for indigestion or heartburn.    amLODipine 5 MG tablet Commonly known as: NORVASC Take 1 tablet (5 mg total) by mouth daily. Start taking on: May 02, 2023   aspirin EC 81 MG tablet Take 1 tablet (81 mg total) by mouth daily. Swallow whole. Start taking on: May 02, 2023   atorvastatin 40 MG tablet Commonly known as: LIPITOR Take 1 tablet (40 mg total) by mouth daily. Start taking on: May 02, 2023   clopidogrel 75 MG tablet Commonly known as: PLAVIX Take 1 tablet (75 mg total) by mouth daily. Start taking on: May 02, 2023   gabapentin 300 MG capsule Commonly known as: NEURONTIN Take 1 capsule (300 mg total) by mouth 3 (three) times daily.   hydrocortisone 5 MG tablet Commonly known as: CORTEF Take 1 tablet (5 mg total) by mouth daily before supper AND 2 tablets (10 mg total) daily with breakfast.   levETIRAcetam 750 MG tablet Commonly known as: KEPPRA Take 1 tablet (750 mg total) by mouth every 8 (eight) hours. What changed:  medication strength how much to take when to take this   losartan 25 MG tablet Commonly known as: COZAAR Take 1 tablet (25 mg total) by mouth daily. Start taking on: May 02, 2023   methadone 10 MG/ML solution Commonly known as: DOLOPHINE Take 120 mg by mouth daily.   naloxone 0.4 MG/ML injection Commonly known as: NARCAN Inject 1 mL (0.4 mg total) into the vein as needed.   ondansetron 8 MG disintegrating tablet Commonly known as: ZOFRAN-ODT Take 1 tablet (8 mg total) by mouth every 8 (eight) hours as needed for nausea or vomiting.   pantoprazole 40 MG tablet Commonly known as: PROTONIX Take 1 tablet (40 mg total) by mouth daily at 6 (six) AM. Start taking on: May 02, 2023   polyethylene glycol 17 g packet Commonly known as: MIRALAX / GLYCOLAX Take 17 g by mouth daily as needed for mild constipation.   senna 8.6 MG Tabs tablet Commonly known as: SENOKOT Take 1 tablet (8.6 mg total) by mouth 2 (two) times daily.   Vienva 0.1-20 MG-MCG  tablet Generic drug: levonorgestrel-ethinyl estradiol Take 1 tablet by mouth daily.        Follow-up Information     Van Clines, MD. Schedule an appointment as soon as possible for a visit in 1 month(s).   Specialty: Neurology Contact information: [address] [place] [address] [phone]                Discharge Exam: Ceasar Mons Weights   04/22/23 0735 04/25/23 0705  Weight: 64 kg 68 kg   General: NAD  Cardiovascular: S1, S2 present Respiratory: CTAB Abdomen: Soft, nontender, nondistended, bowel sounds present Musculoskeletal: No bilateral pedal edema noted Skin: Normal Psychiatry: Normal mood  Neurology: Lower extremity strength symmetrical, sensation decreased on the right arm/leg, with about 2/5 strength in left upper extremity  Condition at discharge: stable  The results of significant diagnostics from this hospitalization (including imaging, microbiology, ancillary and laboratory) are listed below for reference.   Imaging Studies: VAS [place] LOWER EXTREMITY VENOUS (DVT) Result Date: 05/01/2023  Lower Venous DVT Study Patient Name:  Brittany Mullins [facility]  Date of Exam:   04/30/2023  Medical Rec #: [id number]                   Accession #:    [id number] Date of Birth: 23-Jan-1981                   Patient Gender: F Patient Age:   43 years Exam Location:  [facility] Procedure:      VAS [place] LOWER EXTREMITY VENOUS (DVT) Referring Phys: STEPHEN CHIU --------------------------------------------------------------------------------  Indications: Pain.  Risk Factors: None identified. Comparison Study: No significant changes seen since previous exam 04/23/23. Performing Technologist: Shona Simpson  Examination Guidelines: A complete evaluation includes B-mode imaging, spectral Doppler, color Doppler, and power Doppler as needed of all accessible portions of each vessel. Bilateral testing is considered an integral part of a complete examination. Limited  examinations for reoccurring indications may be performed as noted. The reflux portion of the exam is performed with the patient in reverse Trendelenburg.  +---------+---------------+---------+-----------+----------+--------------+ RIGHT    CompressibilityPhasicitySpontaneityPropertiesThrombus Aging +---------+---------------+---------+-----------+----------+--------------+ CFV      Full           Yes      Yes                                 +---------+---------------+---------+-----------+----------+--------------+ SFJ      Full                                                        +---------+---------------+---------+-----------+----------+--------------+ FV Prox  Full                                                        +---------+---------------+---------+-----------+----------+--------------+ FV Mid   Full                                                        +---------+---------------+---------+-----------+----------+--------------+ FV DistalFull                                                        +---------+---------------+---------+-----------+----------+--------------+ PFV      Full                                                        +---------+---------------+---------+-----------+----------+--------------+ POP      Full           Yes      Yes                                 +---------+---------------+---------+-----------+----------+--------------+ PTV      Full                                                        +---------+---------------+---------+-----------+----------+--------------+  PERO     Full                                                        +---------+---------------+---------+-----------+----------+--------------+   +----+---------------+---------+-----------+----------+--------------+ LEFTCompressibilityPhasicitySpontaneityPropertiesThrombus Aging  +----+---------------+---------+-----------+----------+--------------+ CFV Full           Yes      Yes                                 +----+---------------+---------+-----------+----------+--------------+    Summary: RIGHT: - There is no evidence of deep vein thrombosis in the lower extremity.  - No cystic structure found in the popliteal fossa.  LEFT: - No evidence of common femoral vein obstruction.   *See table(s) above for measurements and observations. Electronically signed by Lemar Livings MD on 05/01/2023 at 8:13:12 AM.    Final    DG Shoulder Left Result Date: 04/30/2023 CLINICAL DATA:  Left shoulder pain. EXAM: LEFT SHOULDER - 2+ VIEW COMPARISON:  12/21/2017 FINDINGS: There is no evidence of fracture or dislocation. The alignment and joint spaces are preserved. No erosions. There is no evidence of arthropathy or other focal bone abnormality. Soft tissues are unremarkable. IMPRESSION: Unremarkable radiographs of the left shoulder. Electronically Signed   By: Narda Rutherford M.D.   On: 04/30/2023 16:18   CT HEAD WO CONTRAST ( ) Result Date: 04/30/2023 CLINICAL DATA:  Neuro deficit, acute, stroke suspected. EXAM: CT HEAD WITHOUT CONTRAST TECHNIQUE: Contiguous axial images were obtained from the base of the skull through the vertex without intravenous contrast. RADIATION DOSE REDUCTION: This exam was performed according to the departmental dose-optimization program which includes automated exposure control, adjustment of the mA and/or kV according to patient size and/or use of iterative reconstruction technique. COMPARISON:  CT 04/28/2023.  Brain MRI 04/25/2023. FINDINGS: Brain: Similar focal low-density in the left para median pons without evidence of extension or hemorrhage, consistent with the acute infarction in that location. Otherwise, the brain has normal appearance without evidence of other focal stroke, mass, hemorrhage, hydrocephalus or extra-axial collection. Previous pituitary mass  resection with empty sella appearance presently. Vascular: No abnormal vascular finding. Skull: Negative Sinuses/Orbits: Clear/normal Other: None IMPRESSION: 1. Similar focal low-density in the left para median pons without evidence of extension or hemorrhage, consistent with the acute infarction in that location. No other focal brain finding. 2. 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 Electronically Signed   By: Paulina Fusi M.D.   On: 04/30/2023 15:32   DG Elbow 2 Views Left Result Date: 04/30/2023 CLINICAL DATA:  Left elbow pain. EXAM: LEFT ELBOW - 2 VIEW COMPARISON:  None Available. FINDINGS: Frontal and lateral views of the left elbow. Normal position of the distal anterior humeral fat pad without evidence of a joint effusion. Joint spaces are maintained. No acute fracture or dislocation. IMPRESSION: Normal left elbow radiographs. Electronically Signed   By: Neita Garnet M.D.   On: 04/30/2023 15:23   Overnight EEG with video Result Date: 04/30/2023 Charlsie Quest, MD     05/01/2023  9:59 AM Patient Name: Brittany Mullins MRN: [id number] Epilepsy Attending: Charlsie Quest Referring Physician/Provider: Mathews Argyle, NP Duration: 1/27/205 1523 to 04/30/2023 1427 Patient history: 43 year old female with a PMHx significant for seizures (on Keppra) and  HTN who presents to the emergency department for evaluation of acute onset of right face, arm and leg numbness with weakness. EEG to evaluate for seizure. Level of alertness: Awake, asleep AEDs during EEG study: LEV, GBP Technical aspects: This EEG study was done with scalp electrodes positioned according to the 10-20 International system of electrode placement. Electrical activity was reviewed with band pass filter of 1-70Hz , sensitivity of 7 uV/mm, display speed of 49mm/sec with a 60Hz  notched filter applied as appropriate. EEG data were recorded continuously and digitally stored.  Video monitoring was available  and reviewed as appropriate. Description: The posterior dominant rhythm consists of 9 Hz activity of moderate voltage (25-35 uV) seen predominantly in posterior head regions, symmetric and reactive to eye opening and eye closing. Sleep was characterized by vertex waves, sleep spindles (12 to 14 Hz), maximal frontocentral region.  Hyperventilation and photic stimulation were not performed.   IMPRESSION: This study is within normal limits. No seizures or epileptiform discharges were seen throughout the recording. A normal interictal EEG does not exclude the diagnosis of epilepsy. Priyanka Annabelle Harman   CT HEAD WO CONTRAST ( ) Result Date: 04/29/2023 CLINICAL DATA:  Neuro deficit, acute, stroke suspected EXAM: CT HEAD WITHOUT CONTRAST TECHNIQUE: Contiguous axial images were obtained from the base of the skull through the vertex without intravenous contrast. RADIATION DOSE REDUCTION: This exam was performed according to the departmental dose-optimization program which includes automated exposure control, adjustment of the mA and/or kV according to patient size and/or use of iterative reconstruction technique. COMPARISON:  MRI head 04/25/2023. FINDINGS: Brain: Question loss of gray-white matter differentiation along the right frontal lobe (3:22, 6:29). No evidence of large-territorial acute infarction. No parenchymal hemorrhage. No mass lesion. No extra-axial collection. No mass effect or midline shift. No hydrocephalus. Basilar cisterns are patent. Partial empty sella. Vascular: No hyperdense vessel. Skull: No acute fracture or focal lesion. Sinuses/Orbits: Paranasal sinuses and mastoid air cells are clear. The orbits are unremarkable. Other: None. IMPRESSION: 1. Question loss of gray-white matter differentiation along the right frontal lobe-concern for acute infarction. Consider MRI head without contrast for further evaluation. 2. No acute intracranial hemorrhage. These results will be called to the ordering clinician  or representative by the Radiologist Assistant, and communication documented in the PACS or [employer]. Electronically Signed   By: Tish Frederickson M.D.   On: 04/29/2023 00:04   DG Neck Soft Tissue Result Date: 04/27/2023 CLINICAL DATA:  Palpable left neck mass at the base of the skull. EXAM: NECK SOFT TISSUES - 1+ VIEW COMPARISON:  None Available. FINDINGS: No radiographically visible mass. Multilevel cervical spine degenerative changes with reversal of the normal lordosis in the lower cervical spine. IMPRESSION: No radiographically visible mass. If there is a persistent palpable mass, recommend a CT of the neck with contrast. Electronically Signed   By: Beckie Salts M.D.   On: 04/27/2023 20:00   MR BRAIN W WO CONTRAST Result Date: 04/25/2023 CLINICAL DATA:  Provided history: Pituitary tumor.  Seizures. EXAM: MRI HEAD WITHOUT AND WITH CONTRAST TECHNIQUE: Multiplanar, multiecho pulse sequences of the brain and surrounding structures were obtained without and with intravenous contrast. CONTRAST:  6mL GADAVIST GADOBUTROL 1 MMOL/ML IV SOLN COMPARISON:  Brain MRI 04/22/2023. Non-contrast head CT and CT angiogram head/neck 04/22/2023. FINDINGS: Brain: No age-advanced or lobar predominant parenchymal atrophy. Pituitary protocol sequences were acquired, including dynamic post-contrast imaging. Partially empty sella turcica. No evidence of a pituitary mass. The pituitary stalk is midline and is not abnormally thickened. Known 14  mm acute infarct within the left aspect of the pons, unchanged from the prior MRI of 04/22/2023 (remeasured on prior). There are a few small nonspecific foci of T2 FLAIR hyperintense signal abnormality scattered within the bilateral cerebral white matter. No cortical encephalomalacia is identified. No appreciable hippocampal size or signal asymmetry. No chronic intracranial blood products. No extra-axial fluid collection. No midline shift. No pathologic intracranial enhancement  identified. Vascular: Maintained flow voids within the proximal large arterial vessels. Skull and upper cervical spine: Abnormal T1 hypointense marrow signal within the calvarium and within visualized portions of the cervical spine. Sinuses/Orbits: No mass or acute finding within the imaged orbits. Mild-to-moderate mucosal thickening within the right frontal, bilateral ethmoid and left sphenoid sinuses. Mild mucosal thickening within the right sphenoid sinus. IMPRESSION: 1. Partially empty sella turcica. No evidence of a pituitary mass. 2. Known 14 mm acute infarct within the left aspect of the pons. 3. There are a few small nonspecific chronic insults within the cerebral white matter. 4. Paranasal sinus disease at the imaged levels as described. 5. Abnormal T1 hypointense marrow signal within the calvarium and within visualized portions of the cervical spine. While these findings can reflect a marrow infiltrative process, the most common causes include chronic anemia, smoking and obesity. Electronically Signed   By: Jackey Loge D.O.   On: 04/25/2023 09:55   ECHOCARDIOGRAM COMPLETE Result Date: 04/23/2023    ECHOCARDIOGRAM REPORT   Patient Name:   CIANNA KASPARIAN [facility] Date of Exam: 04/23/2023 Medical Rec #:  [id number]                  Height:       62.0 in Accession #:    [id number]                 Weight:       141.1 lb Date of Birth:  Aug 11, 1980                  BSA:          1.648 m Patient Age:    42 years                   BP:           159/104 mmHg Patient Gender: F                          HR:           76 bpm. Exam Location:  Inpatient Procedure: 2D Echo, Cardiac Doppler and Color Doppler Indications:    Stroke  History:        Patient has prior history of Echocardiogram examinations, most                 recent 06/04/2017.  Sonographer:    Karma Ganja Referring Phys: [id number] DANIEL V THOMPSON  Sonographer Comments: Image acquisition challenging due to patient body habitus and Image acquisition  challenging due to respiratory motion. IMPRESSIONS  1. Left ventricular ejection fraction, by estimation, is 60 to 65%. The left ventricle has normal function. The left ventricle has no regional wall motion abnormalities. Left ventricular diastolic parameters were normal.  2. Right ventricular systolic function is normal. The right ventricular size is normal.  3. The mitral valve is normal in structure. No evidence of mitral valve regurgitation. No evidence of mitral stenosis.  4. The aortic valve is normal in structure. Aortic valve regurgitation is not visualized. No  aortic stenosis is present.  5. The inferior vena cava is normal in size with greater than 50% respiratory variability, suggesting right atrial pressure of 3 mmHg. FINDINGS  Left Ventricle: Left ventricular ejection fraction, by estimation, is 60 to 65%. The left ventricle has normal function. The left ventricle has no regional wall motion abnormalities. The left ventricular internal cavity size was normal in size. There is  no left ventricular hypertrophy. Left ventricular diastolic parameters were normal. Right Ventricle: The right ventricular size is normal. No increase in right ventricular wall thickness. Right ventricular systolic function is normal. Left Atrium: Left atrial size was normal in size. Right Atrium: Right atrial size was normal in size. Pericardium: There is no evidence of pericardial effusion. Mitral Valve: The mitral valve is normal in structure. No evidence of mitral valve regurgitation. No evidence of mitral valve stenosis. Tricuspid Valve: The tricuspid valve is normal in structure. Tricuspid valve regurgitation is not demonstrated. No evidence of tricuspid stenosis. Aortic Valve: The aortic valve is normal in structure. Aortic valve regurgitation is not visualized. No aortic stenosis is present. Aortic valve mean gradient measures 4.0 mmHg. Aortic valve peak gradient measures 8.4 mmHg. Aortic valve area, by VTI measures 2.16  cm. Pulmonic Valve: The pulmonic valve was normal in structure. Pulmonic valve regurgitation is not visualized. No evidence of pulmonic stenosis. Aorta: The aortic root is normal in size and structure. Venous: The inferior vena cava is normal in size with greater than 50% respiratory variability, suggesting right atrial pressure of 3 mmHg. IAS/Shunts: No atrial level shunt detected by color flow Doppler.  LEFT VENTRICLE PLAX 2D LVIDd:         4.60 cm     Diastology LVIDs:         2.80 cm     LV e' medial:    8.81 cm/s LV PW:         1.20 cm     LV E/e' medial:  10.1 LV IVS:        1.10 cm     LV e' lateral:   12.10 cm/s LVOT diam:     2.10 cm     LV E/e' lateral: 7.3 LV SV:         63 LV SV Index:   38 LVOT Area:     3.46 cm  LV Volumes (MOD) LV vol d, MOD A2C: 96.3 ml LV vol d, MOD A4C: 81.4 ml LV vol s, MOD A2C: 41.0 ml LV vol s, MOD A4C: 39.3 ml LV SV MOD A2C:     55.3 ml LV SV MOD A4C:     81.4 ml LV SV MOD BP:      50.7 ml RIGHT VENTRICLE RV Basal diam:  2.90 cm RV S prime:     12.40 cm/s TAPSE (M-mode): 2.7 cm LEFT ATRIUM             Index        RIGHT ATRIUM           Index LA diam:        3.30 cm 2.00 cm/m   RA Area:     11.40 cm LA Vol (A2C):   44.5 ml 27.00 ml/m  RA Volume:   26.40 ml  16.02 ml/m LA Vol (A4C):   37.1 ml 22.51 ml/m LA Biplane Vol: 41.9 ml 25.42 ml/m  AORTIC VALVE AV Area (Vmax):    2.20 cm AV Area (Vmean):   2.22 cm AV Area (VTI):  2.16 cm AV Vmax:           145.00 cm/s AV Vmean:          98.100 cm/s AV VTI:            0.292 m AV Peak Grad:      8.4 mmHg AV Mean Grad:      4.0 mmHg LVOT Vmax:         92.00 cm/s LVOT Vmean:        62.900 cm/s LVOT VTI:          0.182 m LVOT/AV VTI ratio: 0.62  AORTA Ao Root diam: 3.40 cm Ao Asc diam:  2.80 cm MITRAL VALVE MV Area (PHT): 3.86 cm    SHUNTS MV E velocity: 88.70 cm/s  Systemic VTI:  0.18 m MV A velocity: 80.10 cm/s  Systemic Diam: 2.10 cm MV E/A ratio:  1.11 Mihai Croitoru MD Electronically signed by Thurmon Fair MD Signature  Date/Time: 04/23/2023/3:22:37 PM    Final    VAS [place] LOWER EXTREMITY VENOUS (DVT) Result Date: 04/23/2023  Lower Venous DVT Study Patient Name:  Brittany Mullins [facility]  Date of Exam:   04/23/2023 Medical Rec #: [id number]                   Accession #:    [id number] Date of Birth: October 13, 1980                   Patient Gender: F Patient Age:   79 years Exam Location:  [facility] Procedure:      VAS [place] LOWER EXTREMITY VENOUS (DVT) Referring Phys: Ramiro Harvest --------------------------------------------------------------------------------  Indications: Stroke.  Risk Factors: None identified. Comparison Study: No prior studies. Performing Technologist: Chanda Busing RVT  Examination Guidelines: A complete evaluation includes B-mode imaging, spectral Doppler, color Doppler, and power Doppler as needed of all accessible portions of each vessel. Bilateral testing is considered an integral part of a complete examination. Limited examinations for reoccurring indications may be performed as noted. The reflux portion of the exam is performed with the patient in reverse Trendelenburg.  +---------+---------------+---------+-----------+----------+--------------+ RIGHT    CompressibilityPhasicitySpontaneityPropertiesThrombus Aging +---------+---------------+---------+-----------+----------+--------------+ CFV      Full           Yes      Yes                                 +---------+---------------+---------+-----------+----------+--------------+ SFJ      Full                                                        +---------+---------------+---------+-----------+----------+--------------+ FV Prox  Full                                                        +---------+---------------+---------+-----------+----------+--------------+ FV Mid   Full                                                        +---------+---------------+---------+-----------+----------+--------------+  FV  DistalFull                                                        +---------+---------------+---------+-----------+----------+--------------+ PFV      Full                                                        +---------+---------------+---------+-----------+----------+--------------+ POP      Full           Yes      Yes                                 +---------+---------------+---------+-----------+----------+--------------+ PTV      Full                                                        +---------+---------------+---------+-----------+----------+--------------+ PERO     Full                                                        +---------+---------------+---------+-----------+----------+--------------+   +---------+---------------+---------+-----------+----------+--------------+ LEFT     CompressibilityPhasicitySpontaneityPropertiesThrombus Aging +---------+---------------+---------+-----------+----------+--------------+ CFV      Full           Yes      Yes                                 +---------+---------------+---------+-----------+----------+--------------+ SFJ      Full                                                        +---------+---------------+---------+-----------+----------+--------------+ FV Prox  Full                                                        +---------+---------------+---------+-----------+----------+--------------+ FV Mid   Full                                                        +---------+---------------+---------+-----------+----------+--------------+ FV DistalFull                                                        +---------+---------------+---------+-----------+----------+--------------+  PFV      Full                                                        +---------+---------------+---------+-----------+----------+--------------+ POP      Full           Yes      Yes                                  +---------+---------------+---------+-----------+----------+--------------+ PTV      Full                                                        +---------+---------------+---------+-----------+----------+--------------+ PERO     Full                                                        +---------+---------------+---------+-----------+----------+--------------+     Summary: RIGHT: - There is no evidence of deep vein thrombosis in the lower extremity.  - No cystic structure found in the popliteal fossa.  LEFT: - There is no evidence of deep vein thrombosis in the lower extremity.  - No cystic structure found in the popliteal fossa.  *See table(s) above for measurements and observations. Electronically signed by Lemar Livings MD on 04/23/2023 at 1:38:29 PM.    Final    Portable chest 1 View Result Date: 04/22/2023 CLINICAL DATA:  CVA EXAM: PORTABLE CHEST 1 VIEW COMPARISON:  03/16/2023 FINDINGS: No consolidation or effusion. Small nodular focus of opacity at the right base suspect summation shadow or inflammatory given rapid appearance. Normal cardiomediastinal silhouette. No pneumothorax IMPRESSION: No active disease. Small nodular focus of opacity at the right base suspect summation shadow or inflammatory given rapid appearance, attention on two-view chest radiograph follow-up. Electronically Signed   By: Jasmine Pang M.D.   On: 04/22/2023 18:57   MR Brain W and Wo Contrast Result Date: 04/22/2023 CLINICAL DATA:  Headache, neuro deficit. EXAM: MRI HEAD WITHOUT AND WITH CONTRAST TECHNIQUE: Multiplanar, multiecho pulse sequences of the brain and surrounding structures were obtained without and with intravenous contrast. CONTRAST:  6mL GADAVIST GADOBUTROL 1 MMOL/ML IV SOLN COMPARISON:  Head and neck CTA 04/22/2023 FINDINGS: The study is moderately motion degraded. Brain: There is a 10 x 6 mm acute left pontine perforator infarct. No brain parenchymal signal abnormality is  identified elsewhere within limitations of motion artifact. No intracranial hemorrhage, mass, midline shift, extra-axial fluid collection, or abnormal enhancement is identified. Cerebral volume is normal. The ventricles are normal in size. 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 Vascular: Major intracranial vascular flow voids are preserved. Skull and upper cervical spine: Diffusely diminished bone marrow T1 signal intensity, nonspecific though can be seen with anemia, smoking, and obesity. Sinuses/Orbits: Unremarkable orbits. Mild mucosal thickening in the paranasal sinuses. Clear mastoid air cells. Other: None. IMPRESSION: Acute left pontine infarct. Electronically Signed   By: Sebastian Ache M.D.   On: 04/22/2023  17:05   CT ANGIO HEAD NECK W WO CM Result Date: 04/22/2023 CLINICAL DATA:  Neuro deficit, acute, stroke suspected. Right-sided facial, arm, and leg numbness. EXAM: CT ANGIOGRAPHY HEAD AND NECK WITH AND WITHOUT CONTRAST TECHNIQUE: Multidetector CT imaging of the head and neck was performed using the standard protocol during bolus administration of intravenous contrast. Multiplanar CT image reconstructions and MIPs were obtained to evaluate the vascular anatomy. Carotid stenosis measurements (when applicable) are obtained utilizing NASCET criteria, using the distal internal carotid diameter as the denominator. RADIATION DOSE REDUCTION: This exam was performed according to the departmental dose-optimization program which includes automated exposure control, adjustment of the mA and/or kV according to patient size and/or use of iterative reconstruction technique. CONTRAST:  80mL OMNIPAQUE IOHEXOL 350 MG/ML SOLN COMPARISON:  Head CT 08/17/2022 FINDINGS: CT HEAD FINDINGS Brain: There is no evidence of an acute infarct, intracranial hemorrhage, mass, midline shift, or extra-axial fluid collection. Cerebral volume is normal. The ventricles are normal in size. A partially empty sella is unchanged. Vascular: No  hyperdense vessel. Skull: No acute fracture or suspicious osseous lesion. Sinuses/Orbits: Mild mucosal thickening in the paranasal sinuses. Clear mastoid air cells. Unremarkable orbits. Other: None. 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 Widely patent arch vessel origins. Right carotid system: Patent without evidence of stenosis, dissection, or significant atherosclerosis. Left carotid system: Patent without evidence of stenosis, dissection, or significant atherosclerosis. Vertebral arteries: Patent without evidence of stenosis, dissection, or significant atherosclerosis allowing for suboptimal assessment of the left V1 segment due to venous contrast. Mildly to moderately dominant left vertebral artery. Skeleton: Moderate cervical spondylosis. Focally severe left facet arthrosis at C4-5 with grade 1 anterolisthesis. Other neck: No evidence of cervical lymphadenopathy or mass. Upper chest: Clear lung apices. Review of the MIP images confirms the above findings CTA HEAD FINDINGS Motion artifact limits detailed assessment of the intracranial arteries. Anterior circulation: The intracranial internal carotid arteries and proximal ACAs and MCAs are patent, however motion artifact limits assessment for stenosis and limits assessment of the branch vessels. No sizable aneurysm is identified. Posterior circulation: The intracranial vertebral arteries are widely patent to the basilar. The basilar artery is widely patent. Both PCAs are patent proximally with assessment for stenosis limited by motion. No sizable aneurysm is identified. Venous sinuses: Patent. Anatomic variants: None. Review of the MIP images confirms the above findings IMPRESSION: 1. No evidence of acute intracranial abnormality. 2. Motion degraded CTA. No large vessel occlusion or evidence of a  significant stenosis in the neck. Nondiagnostic assessment for intracranial stenosis. Electronically Signed   By: Sebastian Ache M.D.   On: 04/22/2023 08:33    Microbiology: Results for orders placed or performed during the hospital encounter of 08/17/22  SARS Coronavirus 2 by RT PCR (hospital order, performed in [facility] hospital lab) *cepheid single result test* Anterior Nasal Swab     Status: None   Collection Time: 08/17/22  4:29 PM   Specimen: Anterior Nasal Swab  Result Value Ref Range Status   SARS Coronavirus 2 by RT PCR NEGATIVE NEGATIVE Final    Comment: Performed at [facility], [address] [place], [place] [address]    Labs: CBC: Recent Labs  Lab 04/28/23 [phone] 04/30/23 0550  WBC 8.4 10.1  HGB 14.2 13.1  HCT 41.2 38.4  MCV 90.7 91.9  PLT 278 253   Basic Metabolic  Panel: Recent Labs  Lab 04/28/23 0642 04/30/23 0550  NA 135 138  K 4.2 4.3  CL 101 104  CO2 25 25  GLUCOSE 87 97  BUN 16 23*  CREATININE 0.89 0.73  CALCIUM 9.0 9.0   Liver Function Tests: Recent Labs  Lab 04/28/23 0642 04/30/23 0550  AST 32 27  ALT 27 25  ALKPHOS 48 43  BILITOT 0.5 0.3  PROT 6.4* 6.0*  ALBUMIN 3.5 3.1*   CBG: No results for input(s): "GLUCAP" in the last 168 hours.  Discharge time spent: greater than 30 minutes.  Signed: Briant Cedar, MD [facility] 05/01/2023

## 2023-05-01 NOTE — H&P (Addendum)
Physical Medicine and Rehabilitation Admission H&P    Chief Complaint  Patient presents with   Functional deficits     HPI:  Brittany Mullins is a 43 year old RH- female with Addison's disease, PCOS, pancreatitis with chronic N/V, seizures, PUD/duodenitis, chronic pain (abdominal due to pancreatitis)- on methadone, bipolar d/o, marijuana use  who was admitted on 04/22/23 with reports of right sided numbness that started day before as well as reports of abnormal gait with complaints of left calf pain per family.  UDS positive for THC. CTA head/neck was negative for LVO. MRI brain done revealing acute paramedian left pontine infarct and ultrasound BLE dopplers were negative for DVT. She was noted to have slurred speech with intermittent aphasia and recommended DAPT X 3 weeks for stroke felt to be due to small vessel disease.   She has had issues with recurrent nausea as well as headaches. Orthostasis with hyperkalemia at admission treated with IVF. Patient without PCP and had stopped taking her prednisone (didn't like how it made her feel) and question recent neurological follow up. Dr. Karel Jarvis recommended by Dr. Roda Shutters. She was started on hydrocortisone 10 mg am/ 5 mg pm. Has required IV morphine for HA as well as IV dilaudid for post auricular pain with induration treated with warm compresses.  X ray of soft tissue neck done and was negative for mass Oxycodone added to help manage pain.    She also reported Left shoulder and left elbow pain,  X rays ordered 01/28  and were negative for subluxation, fracture or dislocation. She reports prior dislocations of her L shoulder in the past.  She reported increase in dysesthesias on 01/27 and CT head repeated which was negative for extension or hemorrhage. Neurology reconsulted as boyfriend reported 2 staring episodes with brief episodes of unresponsiveness followed by stiffness LUE 01/27 am question due to seizure. LT- EEG ordered which was negative for  seizures and Keppra increased to 750 mg TID per Dr. Pearlean Brownie.   Low protein S noted. Neurology recommending referral to hematology.   Therapy has been working with patient who is limited by pain LUE, ataxic gait with decreased weight shifting to the right, right blurry vision,  right sided weakness and is requiring min to mod assist overall. She was independent PTA. CIR recommended due to functional decline.      Review of Systems  Constitutional:  Negative for chills and fever.  HENT:  Positive for hearing loss (mild on left).   Eyes:  Positive for blurred vision and double vision.       Was losing peripheral vision due to shingles.   Respiratory:  Negative for cough and shortness of breath.   Cardiovascular:  Negative for chest pain and palpitations.  Gastrointestinal:  Positive for blood in stool, constipation and nausea.  Genitourinary: Negative.   Musculoskeletal:  Positive for joint pain and neck pain. Negative for myalgias.  Skin:  Negative for rash.  Neurological:  Positive for tingling, sensory change, speech change, weakness and headaches. Negative for dizziness.  Psychiatric/Behavioral:  The patient has insomnia.     Past Medical History:  Diagnosis Date   Addison's disease [facility])    Brain tumor (benign) (HCC)    Chronic pancreatitis (HCC)    Hearing loss    Hypertension    Marijuana use    Per pt: "medical marijuana patient"   Pancreatitis    PCOS (polycystic ovarian syndrome)    Pituitary tumor    Seizures (HCC)  Past Surgical History:  Procedure Laterality Date   ABDOMINAL HYSTERECTOMY     APPENDECTOMY     BIOPSY  08/16/2021   Procedure: BIOPSY;  Surgeon: Shellia Cleverly, DO;  Location: [facility];  Service: Gastroenterology;;   CESAREAN SECTION     CHOLECYSTECTOMY     ELBOW SURGERY     ESOPHAGOGASTRODUODENOSCOPY (EGD) WITH PROPOFOL N/A 08/16/2021   Procedure: ESOPHAGOGASTRODUODENOSCOPY (EGD) WITH PROPOFOL;  Surgeon: Shellia Cleverly, DO;  Location:  [facility];  Service: Gastroenterology;  Laterality: N/A;   kidney stent     pancreatic stent     RADIOLOGY WITH ANESTHESIA N/A 04/25/2023   Procedure: MRI of Brain without contrast;  Surgeon: Radiologist, Medication, MD;  Location: [facility] OR;  Service: Radiology;  Laterality: N/A;    Family History  Problem Relation Age of Onset   Cancer Mother    Hypertension Mother    Diabetes Maternal Grandmother    Diabetes Maternal Grandfather     Social History:  Lives with significant other--disabled since 2013 due to bipolar d/o and pancrease issues. She reports that she has never smoked. She has never used smokeless tobacco. She reports that she does not currently use drugs after having used the following drugs: Marijuana. She reports that she does not drink alcohol.   Allergies  Allergen Reactions   Bee Venom Anaphylaxis   Toradol [Ketorolac Tromethamine] Shortness Of Breath   Benadryl [Diphenhydramine] Other (See Comments)    States she cannot breathe   Other     CT dye    Medications Prior to Admission  Medication Sig Dispense Refill   acetaminophen (TYLENOL) 325 MG tablet Take 2 tablets (650 mg total) by mouth every 6 (six) hours as needed for mild pain (or Fever >/= 101). 20 tablet 0   albuterol (VENTOLIN HFA) 108 (90 Base) MCG/ACT inhaler Inhale 1-2 puffs into the lungs every 6 (six) hours as needed for wheezing or shortness of breath. (Patient not taking: Reported on 04/22/2023) 6.7 g 0   alum & mag hydroxide-simeth (MAALOX/MYLANTA) 200-200-20 MG/5ML suspension Take 15 mLs by mouth every 6 (six) hours as needed for indigestion or heartburn.     [START ON 05/02/2023] amLODipine (NORVASC) 5 MG tablet Take 1 tablet (5 mg total) by mouth daily.     [START ON 05/02/2023] aspirin EC 81 MG tablet Take 1 tablet (81 mg total) by mouth daily. Swallow whole.     [START ON 05/02/2023] atorvastatin (LIPITOR) 40 MG tablet Take 1 tablet (40 mg total) by mouth daily.     [START ON 05/02/2023] clopidogrel  (PLAVIX) 75 MG tablet Take 1 tablet (75 mg total) by mouth daily.     gabapentin (NEURONTIN) 300 MG capsule Take 1 capsule (300 mg total) by mouth 3 (three) times daily.     hydrocortisone (CORTEF) 5 MG tablet Take 1 tablet (5 mg total) by mouth daily before supper AND 2 tablets (10 mg total) daily with breakfast.     levETIRAcetam (KEPPRA) 750 MG tablet Take 1 tablet (750 mg total) by mouth every 8 (eight) hours.     [START ON 05/02/2023] losartan (COZAAR) 25 MG tablet Take 1 tablet (25 mg total) by mouth daily.     methadone (DOLOPHINE) 10 MG/ML solution Take 120 mg by mouth daily.     naloxone (NARCAN) 0.4 MG/ML injection Inject 1 mL (0.4 mg total) into the vein as needed.     ondansetron (ZOFRAN-ODT) 8 MG disintegrating tablet Take 1 tablet (8 mg total) by mouth every  8 (eight) hours as needed for nausea or vomiting. 20 tablet 0   [START ON 05/02/2023] pantoprazole (PROTONIX) 40 MG tablet Take 1 tablet (40 mg total) by mouth daily at 6 (six) AM.     polyethylene glycol (MIRALAX / GLYCOLAX) 17 g packet Take 17 g by mouth daily as needed for mild constipation.     senna (SENOKOT) 8.6 MG TABS tablet Take 1 tablet (8.6 mg total) by mouth 2 (two) times daily.     VIENVA 0.1-20 MG-MCG tablet Take 1 tablet by mouth daily.          Home: Home Living Family/patient expects to be discharged to:: Private residence Living Arrangements:  Durene Cal for about 1 1/2 years) Available Help at Discharge: Family, Available 24 hours/day (longest time may be alone is 2 to 4 hrs) Type of Home: Apartment Home Access: Stairs to enter [employer] of Steps:  (3rd floor apartment currently) Entrance Stairs-Rails: Can reach both Home Layout: One level Bathroom Shower/Tub: Engineer, manufacturing systems: Standard Bathroom Accessibility: Yes Home Equipment: None Additional Comments: Hilda Lias RN CM, to provide [place] with note to give apartment complex to request first floor apartment  Lives With:   Durene Cal)   Functional History: Prior Function Prior Level of Function : Independent/Modified Independent, Driving Mobility Comments: ind ADLs Comments: ind with ADL/IADL, boyfriend reminds pt to takes medications   Functional Status:  Mobility: Bed Mobility Overal bed mobility: Needs Assistance Bed Mobility: Supine to Sit Supine to sit: Contact guard General bed mobility comments: CGA for safety with increased time Transfers Overall transfer level: Needs assistance Equipment used: Left platform walker, None Transfers: Sit to/from Stand, Bed to chair/wheelchair/BSC Sit to Stand: Min assist, Contact guard assist Bed to/from chair/wheelchair/BSC transfer type:: Step pivot Stand pivot transfers: Min assist Step pivot transfers: Min assist General transfer comment: min A to boost and steady on rise with initial stand to PFRW, fading to CGA with practice, min A to step pivot to chair at end of session without UE support Ambulation/Gait Ambulation/Gait assistance: Mod assist, Min assist Gait Distance (Feet): 24 Feet (x2) Assistive device: Left platform walker Gait Pattern/deviations: Step-to pattern, Decreased stance time - right, Decreased step length - right, Decreased dorsiflexion - right, Decreased weight shift to right, Ataxic General Gait Details: PFRW utilized as pt with flexed L elbow and unable to extend arm to use standard RW, mod A fading to min A with distance to steady and manage RW, cues for wider BOS as pt with contineud RLE adduction during swing phase Gait velocity: decr   ADL: ADL Overall ADL's : Needs assistance/impaired Eating/Feeding: Minimal assistance Grooming: Moderate assistance, Sitting Grooming Details (indicate cue type and reason): oral care sitting at sink Upper Body Bathing: Moderate assistance Lower Body Bathing: Maximal assistance Upper Body Dressing : Moderate assistance Lower Body Dressing: Maximal assistance Lower Body Dressing Details (indicate  cue type and reason): able to self assist into figure 4 positioning for each LE.  education on not bending forward while seated eob for fall prevention Toilet Transfer: Minimal assistance, Stand-pivot, BSC/3in1 Toileting- Clothing Manipulation and Hygiene: Minimal assistance Functional mobility during ADLs: Minimal assistance General ADL Comments: cues for relaxation tech. including PLB. pt. with visible tension B shoulders raised and holding breath.  responded well to tech. and was able to imlement the breathing stratgies during in room ambulation   Cognition: Cognition Overall Cognitive Status: Within Functional Limits for tasks assessed Orientation Level: Oriented X4 Cognition Arousal: Alert Behavior During Therapy: [facility] for  tasks assessed/performed, Anxious Overall Cognitive Status: Within Functional Limits for tasks assessed General Comments: improvmement with impulsive movements     Blood pressure (!) 138/92, pulse 98, temperature 98.1 F (36.7 C), resp. rate 18, height 5\' 2"  (1.575 m), weight 70.1 kg, SpO2 96%.   General: No apparent distress HEENT: Head is normocephalic, atraumatic, sclera anicteric, oral mucosa pink and moist, + tender nodule behind L ear Neck: Supple without JVD  Heart: Reg rate and rhythm. No murmurs rubs or gallops Chest: CTA bilaterally without wheezes, rales, or rhonchi; no distress Abdomen: Soft, mildly-tender, non-distended, bowel sounds positive. Extremities: No clubbing, cyanosis, or edema. Pulses are 2+ Psych: Pt's affect is appropriate. Pt is cooperative Skin: Clean and intact without signs of breakdown Neuro:     Mental Status: AAOx4, memory overall intact Speech/Languate: Speech ataxic at times but spontaneous and clear when discussing issues with family. Dysarthria. Naming and repetition intact, follows simple commands CRANIAL NERVES: II: PERRL. R visual field deficits III, IV, VI: EOM intact, no gaze preference or deviation V: normal  sensation bilaterally VII: no asymmetry VIII: normal hearing to speech IX, X: normal palatal elevation XI: Decreased shoulder shrug on the left XII: Tongue midline     MOTOR: RUE: 4-/5 Deltoid, 4-/5 Biceps, 4-/5 Triceps,4-/5 Grip LUE: Unable to assess proximal strength as limited by pain and guarding at shoulder and elbow 5/5 Grip  RLE: HF 3/5, KE 3/5, ADF 2/5, APF 3/5 LLE: HF 5/5, KE 5/5, ADF 5/5, APF 5/5   REFLEXES: no ankle cononus    SENSORY: Sensation intact to light touch in all 4 extremities altered and left upper extremity, right upper extremity and right lower extremity   Coordination: Right upper extremity coordination appears intact given level of weakness  MSK: Left shoulder TTP and  in abduction position.  Patient will not let me abduct shoulder or flex extend her elbow more than a few degrees.  Results for orders placed or performed during the hospital encounter of 04/22/23 (from the past 48 hours)  Comprehensive metabolic panel     Status: Abnormal   Collection Time: 04/30/23  5:50 AM  Result Value Ref Range   Sodium 138 135 - 145 mmol/L   Potassium 4.3 3.5 - 5.1 mmol/L   Chloride 104 98 - 111 mmol/L   CO2 25 22 - 32 mmol/L   Glucose, Bld 97 70 - 99 mg/dL    Comment: Glucose reference range applies only to samples taken after fasting for at least 8 hours.   BUN 23 (H) 6 - 20 mg/dL   Creatinine, Ser 4.03 0.44 - 1.00 mg/dL   Calcium 9.0 8.9 - 47.4 mg/dL   Total Protein 6.0 (L) 6.5 - 8.1 g/dL   Albumin 3.1 (L) 3.5 - 5.0 g/dL   AST 27 15 - 41 U/L   ALT 25 0 - 44 U/L   Alkaline Phosphatase 43 38 - 126 U/L   Total Bilirubin 0.3 0.0 - 1.2 mg/dL   GFR, Estimated >25 >95 mL/min    Comment: (NOTE) Calculated using the CKD-EPI Creatinine Equation (2021)    Anion gap 9 5 - 15    Comment: Performed at [facility], [address] [place], [place] [address]  CBC     Status: None   Collection Time: 04/30/23  5:50 AM  Result Value Ref Range   WBC 10.1 4.0 -  10.5 K/uL   RBC 4.18 3.87 - 5.11 MIL/uL   Hemoglobin 13.1 12.0 - 15.0 g/dL  HCT 38.4 36.0 - 46.0 %   MCV 91.9 80.0 - 100.0 fL   MCH 31.3 26.0 - 34.0 pg   MCHC 34.1 30.0 - 36.0 g/dL   RDW 16.1 09.6 - 04.5 %   Platelets 253 150 - 400 K/uL   nRBC 0.0 0.0 - 0.2 %    Comment: Performed at [facility], [address] [place], [place] [address]   VAS [place] LOWER EXTREMITY VENOUS (DVT) Result Date: 05/01/2023  Lower Venous DVT Study Patient Name:  Brittany Mullins [facility]  Date of Exam:   04/30/2023 Medical Rec #: [id number]                   Accession #:    [id number] Date of Birth: 10-Oct-1980                   Patient Gender: F Patient Age:   61 years Exam Location:  [facility] Procedure:      VAS [place] LOWER EXTREMITY VENOUS (DVT) Referring Phys: STEPHEN CHIU --------------------------------------------------------------------------------  Indications: Pain.  Risk Factors: None identified. Comparison Study: No significant changes seen since previous exam 04/23/23. Performing Technologist: Shona Simpson  Examination Guidelines: A complete evaluation includes B-mode imaging, spectral Doppler, color Doppler, and power Doppler as needed of all accessible portions of each vessel. Bilateral testing is considered an integral part of a complete examination. Limited examinations for reoccurring indications may be performed as noted. The reflux portion of the exam is performed with the patient in reverse Trendelenburg.  +---------+---------------+---------+-----------+----------+--------------+ RIGHT    CompressibilityPhasicitySpontaneityPropertiesThrombus Aging +---------+---------------+---------+-----------+----------+--------------+ CFV      Full           Yes      Yes                                 +---------+---------------+---------+-----------+----------+--------------+ SFJ      Full                                                         +---------+---------------+---------+-----------+----------+--------------+ FV Prox  Full                                                        +---------+---------------+---------+-----------+----------+--------------+ FV Mid   Full                                                        +---------+---------------+---------+-----------+----------+--------------+ FV DistalFull                                                        +---------+---------------+---------+-----------+----------+--------------+ PFV      Full                                                        +---------+---------------+---------+-----------+----------+--------------+  POP      Full           Yes      Yes                                 +---------+---------------+---------+-----------+----------+--------------+ PTV      Full                                                        +---------+---------------+---------+-----------+----------+--------------+ PERO     Full                                                        +---------+---------------+---------+-----------+----------+--------------+   +----+---------------+---------+-----------+----------+--------------+ LEFTCompressibilityPhasicitySpontaneityPropertiesThrombus Aging +----+---------------+---------+-----------+----------+--------------+ CFV Full           Yes      Yes                                 +----+---------------+---------+-----------+----------+--------------+    Summary: RIGHT: - There is no evidence of deep vein thrombosis in the lower extremity.  - No cystic structure found in the popliteal fossa.  LEFT: - No evidence of common femoral vein obstruction.   *See table(s) above for measurements and observations. Electronically signed by Lemar Livings MD on 05/01/2023 at 8:13:12 AM.    Final    DG Shoulder Left Result Date: 04/30/2023 CLINICAL DATA:  Left shoulder pain. EXAM: LEFT SHOULDER - 2+ VIEW  COMPARISON:  12/21/2017 FINDINGS: There is no evidence of fracture or dislocation. The alignment and joint spaces are preserved. No erosions. There is no evidence of arthropathy or other focal bone abnormality. Soft tissues are unremarkable. IMPRESSION: Unremarkable radiographs of the left shoulder. Electronically Signed   By: Narda Rutherford M.D.   On: 04/30/2023 16:18   CT HEAD WO CONTRAST ( ) Result Date: 04/30/2023 CLINICAL DATA:  Neuro deficit, acute, stroke suspected. EXAM: CT HEAD WITHOUT CONTRAST TECHNIQUE: Contiguous axial images were obtained from the base of the skull through the vertex without intravenous contrast. RADIATION DOSE REDUCTION: This exam was performed according to the departmental dose-optimization program which includes automated exposure control, adjustment of the mA and/or kV according to patient size and/or use of iterative reconstruction technique. COMPARISON:  CT 04/28/2023.  Brain MRI 04/25/2023. FINDINGS: Brain: Similar focal low-density in the left para median pons without evidence of extension or hemorrhage, consistent with the acute infarction in that location. Otherwise, the brain has normal appearance without evidence of other focal stroke, mass, hemorrhage, hydrocephalus or extra-axial collection. 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 Vascular: No abnormal vascular finding. Skull: Negative Sinuses/Orbits: Clear/normal Other: None IMPRESSION: 1. Similar focal low-density in the left para median pons without evidence of extension or hemorrhage, consistent with the acute infarction in that location. No other focal brain finding. 2. 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 Electronically Signed   By: Paulina Fusi M.D.   On: 04/30/2023 15:32   DG Elbow 2 Views Left Result Date: 04/30/2023 CLINICAL DATA:  Left elbow pain. EXAM: LEFT ELBOW -  2 VIEW COMPARISON:  None Available. FINDINGS: Frontal and lateral  views of the left elbow. Normal position of the distal anterior humeral fat pad without evidence of a joint effusion. Joint spaces are maintained. No acute fracture or dislocation. IMPRESSION: Normal left elbow radiographs. Electronically Signed   By: Neita Garnet M.D.   On: 04/30/2023 15:23   Overnight EEG with video Result Date: 04/30/2023 Charlsie Quest, MD     05/01/2023  9:59 AM Patient Name: Elliemae Braman MRN: [id number] Epilepsy Attending: Charlsie Quest Referring Physician/Provider: Mathews Argyle, NP Duration: 1/27/205 1523 to 04/30/2023 1427 Patient history: 43 year old female with a PMHx significant for seizures (on Keppra) and HTN who presents to the emergency department for evaluation of acute onset of right face, arm and leg numbness with weakness. EEG to evaluate for seizure. Level of alertness: Awake, asleep AEDs during EEG study: LEV, GBP Technical aspects: This EEG study was done with scalp electrodes positioned according to the 10-20 International system of electrode placement. Electrical activity was reviewed with band pass filter of 1-70Hz , sensitivity of 7 uV/mm, display speed of 26mm/sec with a 60Hz  notched filter applied as appropriate. EEG data were recorded continuously and digitally stored.  Video monitoring was available and reviewed as appropriate. Description: The posterior dominant rhythm consists of 9 Hz activity of moderate voltage (25-35 uV) seen predominantly in posterior head regions, symmetric and reactive to eye opening and eye closing. Sleep was characterized by vertex waves, sleep spindles (12 to 14 Hz), maximal frontocentral region.  Hyperventilation and photic stimulation were not performed.   IMPRESSION: This study is within normal limits. No seizures or epileptiform discharges were seen throughout the recording. A normal interictal EEG does not exclude the diagnosis of epilepsy. Priyanka O Yadav      Blood pressure (!) 138/92, pulse 98, temperature  98.1 F (36.7 C), resp. rate 18, height 5\' 2"  (1.575 m), weight 70.1 kg, SpO2 96%.  Medical Problem List and Plan: 1. Functional deficits secondary to Lft pontine CVA, likely small vessel disease  -patient may shower  -ELOS/Goals: 10-14 days,  Mod I to Sup PT/OT/SLP  -Admit to CIR 2.  Antithrombotics: -DVT/anticoagulation:  Pharmaceutical: Lovenox  -antiplatelet therapy: DAPT X 3 weeks followed by ASA alone.  3. Pain Management: On Methadone (followed by [facility]).  --now on prn oxycodone due to shoulder/arm pain  --gabapentin TID for neuropathy 4. H/o bipolar d/o/Mood/Behavior/Sleep: LCSW to follow for evaluation and support.   --Trazodone prn for insomnia   -antipsychotic agents: N/A--has not been on any psych meds for years.  5. Neuropsych/cognition: This patient is capable of making decisions on her own behalf. 6. Skin/Wound Care: Routine pressure relief measures. 7. Fluids/Electrolytes/Nutrition: Monitor I/O. Check CMET in am.  8. HTN: Monitor BP TID--continue Cozaar and Norvasc. Long term goal normotensive 9. Seizure d/o: Now on Keppra TID 10. Addison's disease: Now on Cortef 10 mg am/5 mg pm 11. Chronic pancreatitis: Has chronic issues with nausea --continue phenergan --continue PPI 13. Constipation: Reports rectal bleeding-->colace added to senna due to hx of hems.   14. Left arm shoulder/elbow pain  -Xray elbow/shoulder 1/28 negative 15. Marijuana use  -Advise cessation 16. Hx of pituitary tumor  -f/u Neurosurgery 17. Low protein S  -f/u Hematology outpatient   Jacquelynn Cree, PA-C 05/01/2023  I have personally performed a face to face diagnostic evaluation of this patient and formulated the key components of the plan.  Additionally, I have personally reviewed laboratory data, imaging studies,  as well as relevant notes and concur with the physician assistant's documentation above.  The patient's status has not changed from the original H&P.  Any changes in  documentation from the acute care chart have been noted above.  Fanny Dance, MD, [place]

## 2023-05-01 NOTE — Progress Notes (Signed)
  Inpatient Rehabilitation Admissions Coordinator   I have CIR bed to admit her to today. I met with patient and her son and dtr in law at bedside. They are in agreement. Dr Sharolyn Douglas, acute team and [facility] made aware. I will make the arrangements.  Ottie Glazier, RN, MSN Rehab Admissions Coordinator [phone] 05/01/2023 10:21 AM

## 2023-05-01 NOTE — Plan of Care (Signed)
Calm, cooperative, comfortable, family is at bedside. No complaint through this time.  Problem: Education: Goal: Knowledge of disease or condition will improve Outcome: Progressing   Problem: Ischemic Stroke/TIA Tissue Perfusion: Goal: Complications of ischemic stroke/TIA will be minimized Outcome: Progressing   Problem: Safety: Goal: Ability to remain free from injury will improve Outcome: Progressing   Problem: Coping: Goal: Level of anxiety will decrease Outcome: Progressing

## 2023-05-01 NOTE — TOC Transition Note (Signed)
Transition of Care [facility]) - Discharge Note   Patient Details  Name: Brittany Mullins MRN: [id number] Date of Birth: September 28, 1980  Transition [facility] CM/SW Contact:  Kermit Balo, RN Phone Number: 05/01/2023, 10:46 AM   Clinical Narrative:     Pt is discharging to CIR today. CM signing off.   Final next level of care: IP Rehab Facility Barriers to Discharge: No Barriers Identified   Patient Goals and CMS Choice   CMS Medicare.gov Compare Post Acute Care list provided to:: Patient Choice offered to / list presented to : Patient      Discharge Placement                       Discharge Plan and Services Additional resources added to the After Visit Summary for     Discharge Planning Services: CM Consult Post Acute Care Choice: IP Rehab                               Social Drivers of Health (SDOH) Interventions SDOH Screenings   Food Insecurity: No Food Insecurity (04/22/2023)  Housing: High Risk (04/22/2023)  Transportation Needs: No Transportation Needs (04/22/2023)  Utilities: Not At Risk (04/22/2023)  Tobacco Use: Low Risk  (04/25/2023)     Readmission Risk Interventions     No data to display

## 2023-05-01 NOTE — Progress Notes (Signed)
Transferred patient to Rehab via bed.

## 2023-05-01 NOTE — Progress Notes (Signed)
Fanny Dance, MD  Physician Physical Medicine and Rehabilitation   PMR Pre-admission    Signed   Date of Service: 04/30/2023  5:26 PM  Related encounter: ED to Hosp-Admission (Current) from 04/22/2023 in [facility]   Signed     Expand All Collapse All  Show:Clear all [x] Written[x] Templated[x] Copied  Added by: [x] Travone Georg, Tye [place], RN[x] Fanny Dance, MD  [] Hover for details PMR Admission Coordinator Pre-Admission Assessment   Patient: Brittany Mullins is an 43 y.o., female MRN: [id number] DOB: [birth year] Height: 5\' 2"  (157.5 cm) Weight: 68 kg   Insurance Information HMO:     PPO:      PCP:      IPA:      80/20:      OTHER:  PRIMARY: Medicare a and b      Policy#: [id number]              Subscriber: pt Benefits:  Phone #: passport one source online     Name: 1/24 Eff. Date: 02/01/2011     Deduct: $1676      Out of Pocket Max: none      Life Max: none CIR: 100%      SNF: 20 full days Outpatient: 80%     Co-Pay: 20% Home Health: 100%      Co-Pay: none DME: 80%     Co-Pay: 20% Providers: patient choice SECONDARY: Medicaid of [place]      Policy#: [id number] o Verified on 1/24 on passport one source online active MADQB        Financial Counselor:       Phone#:    The "Data Collection Information Summary" for patients in Inpatient Rehabilitation Facilities with attached "Privacy Act Statement-Health Care Records" was provided and verbally reviewed with: Patient and Family   Emergency Contact Information Contact Information       Name Relation Home Work Mobile    Morris,[name] Significant other     [phone]    Shatira, Dobosz [phone]   [phone]         Other Contacts   None on File      Current Medical History  Patient Admitting Diagnosis: CVA   History of Present Illness: 43 year old female with history of gastritis/duodenitis, Addison's disease, pituitary tumor, empty sells syndrome, HTN, opiate dependence on  methadone, bipolar disease, THC use, PCOS, seizures presented to the ED on 04/22/23 with right sided weakness and numbness.    MRI showed left pontine infarct. CTA no LVO. No interventions due to outside of window. UDS positive THC. DVT prophylaxis with lovenox. Placed on ASA and Plavix for 3 weeks  and then ASA alone. Keppra continued as at home. Typically has 1 to 2 seizures per month but can increase to 1-2 seizures a week. Has service Dog for seizure detection. On no home meds for HTN. Placed on Norvasc and Cozaar. LDL 94 and placed on Lipitor. On methadone at home due to pain and nausea from chronic pancreatitis. Estrogen for PCOS.   Developed increased left sided weakness. Stroke team recommended repeat CT and  EEG. No evidence of definite seizures.  Keppra dose adjusted. Noted left upper extremity hypertonia flexion which had also raised concerns for seizure.    Complete NIHSS TOTAL: 4   Patient's medical record from [facility] has been reviewed by the rehabilitation admission coordinator and physician.   Past Medical History      Past Medical History:  Diagnosis Date   Addison's disease (  HCC)     Brain tumor (benign) (HCC)     Hypertension     Marijuana use      Per pt: "medical marijuana patient"   Pancreatitis     PCOS (polycystic ovarian syndrome)     Pituitary tumor     Seizures (HCC)          Has the patient had major surgery during 100 days prior to admission? No   Family History   family history includes Cancer in her mother; Diabetes in her maternal grandfather and maternal grandmother; Hypertension in her mother.   Current Medications  Current Medications    Current Facility-Administered Medications:    acetaminophen (TYLENOL) tablet 650 mg, 650 mg, Oral, Q6H PRN, 650 mg at 04/27/23 2223 **OR** acetaminophen (TYLENOL) suppository 650 mg, 650 mg, Rectal, Q6H PRN, Rodolph Bong, MD   albuterol (PROVENTIL) (2.5 MG/3ML) 0.083% nebulizer solution 2.5 mg,  2.5 mg, Nebulization, Q2H PRN, Rodolph Bong, MD   alum & mag hydroxide-simeth (MAALOX/MYLANTA) 200-200-20 MG/5ML suspension 15 mL, 15 mL, Oral, Q6H PRN, Howerter, Justin B, DO, 15 mL at 04/26/23 0440   amLODipine (NORVASC) tablet 5 mg, 5 mg, Oral, Daily, Rodolph Bong, MD, 5 mg at 05/01/23 1019   aspirin EC tablet 81 mg, 81 mg, Oral, Daily, Marvel Plan, MD, 81 mg at 05/01/23 1019   atorvastatin (LIPITOR) tablet 40 mg, 40 mg, Oral, Daily, Rodolph Bong, MD, 40 mg at 05/01/23 1019   calcium carbonate (TUMS - dosed in mg elemental calcium) chewable tablet 400 mg of elemental calcium, 2 tablet, Oral, Once, Briant Cedar, MD   clopidogrel (PLAVIX) tablet 75 mg, 75 mg, Oral, Daily, Rodolph Bong, MD, 75 mg at 05/01/23 1019   cyclobenzaprine (FLEXERIL) tablet 5 mg, 5 mg, Oral, TID PRN, Jerald Kief, MD, 5 mg at 04/28/23 2253   enoxaparin (LOVENOX) injection 40 mg, 40 mg, Subcutaneous, Q24H, Rodolph Bong, MD, 40 mg at 04/30/23 1936   gabapentin (NEURONTIN) capsule 300 mg, 300 mg, Oral, TID, Gevena Mart A, NP, 300 mg at 05/01/23 1019   hydrALAZINE (APRESOLINE) injection 10 mg, 10 mg, Intravenous, Q6H PRN, Rodolph Bong, MD   hydrocortisone (CORTEF) tablet 5 mg, 5 mg, Oral, QAC supper, 5 mg at 04/30/23 1647 **AND** hydrocortisone (CORTEF) tablet 10 mg, 10 mg, Oral, Q breakfast, Rodolph Bong, MD, 10 mg at 05/01/23 0843   levETIRAcetam (KEPPRA) tablet 750 mg, 750 mg, Oral, Q8H, Sethi, Pramod S, MD, 750 mg at 05/01/23 0631   levonorgestrel-ethinyl estradiol (ALESSE) 0.1-20 MG-MCG per tablet 1 tablet, 1 tablet, Oral, Daily, Rodolph Bong, MD, 1 tablet at 05/01/23 1024   LORazepam (ATIVAN) injection 1 mg, 1 mg, Intravenous, Once, Howerter, Justin B, DO   losartan (COZAAR) tablet 25 mg, 25 mg, Oral, Daily, Rodolph Bong, MD, 25 mg at 05/01/23 1019   methadone (DOLOPHINE) tablet 120 mg, 120 mg, Oral, Daily, Rodolph Bong, MD, 120 mg at 05/01/23 0843    naloxone [facility]) injection 0.4 mg, 0.4 mg, Intravenous, PRN, Howerter, Justin B, DO   ondansetron (ZOFRAN) injection 4 mg, 4 mg, Intravenous, Q6H PRN, Jerald Kief, MD, 4 mg at 04/28/23 1603   oxyCODONE-acetaminophen (PERCOCET/ROXICET) 5-325 MG per tablet 1-2 tablet, 1-2 tablet, Oral, Q6H PRN, Howerter, Justin B, DO, 2 tablet at 05/01/23 0319   pantoprazole (PROTONIX) EC tablet 40 mg, 40 mg, Oral, Q0600, Rodolph Bong, MD, 40 mg at 05/01/23 1610   polyethylene glycol (MIRALAX / GLYCOLAX)  packet 17 g, 17 g, Oral, Daily PRN, Rodolph Bong, MD, 17 g at 05/01/23 1019   promethazine (PHENERGAN) tablet 25 mg, 25 mg, Oral, Q6H PRN, Jerald Kief, MD, 25 mg at 04/29/23 1313   senna (SENOKOT) tablet 8.6 mg, 1 tablet, Oral, BID, Rodolph Bong, MD, 8.6 mg at 05/01/23 1019   sodium chloride flush (NS) 0.9 % injection 3 mL, 3 mL, Intravenous, Q12H, Rodolph Bong, MD, 3 mL at 05/01/23 1026   sorbitol 70 % solution 30 mL, 30 mL, Oral, Daily PRN, Rodolph Bong, MD     Patients Current Diet:  Diet Order                  Diet Heart Room service appropriate? Yes; Fluid consistency: Thin  Diet effective now                       Precautions / Restrictions Precautions Precautions: Fall Precaution Comments: watch BP Restrictions Weight Bearing Restrictions Per Provider Order: No    Has the patient had 2 or more falls or a fall with injury in the past year? No   Prior Activity Level Community (5-7x/wk): Independent, driving, no AD (Has service dog, Misty, pit bull for 1 1/2 years for seizure detection)   Prior Functional Level Self Care: Did the patient need help bathing, dressing, using the toilet or eating? Independent   Indoor Mobility: Did the patient need assistance with walking from room to room (with or without device)? Independent   Stairs: Did the patient need assistance with internal or external stairs (with or without device)? Independent   Functional  Cognition: Did the patient need help planning regular tasks such as shopping or remembering to take medications? Needed some help   Patient Information Are you of Hispanic, Latino/a,or Spanish origin?: A. No, not of Hispanic, Latino/a, or Spanish origin What is your race?: A. White Do you need or want an interpreter to communicate with a doctor or health care staff?: 0. No   Patient's Response To:  Health Literacy and Transportation Is the patient able to respond to health literacy and transportation needs?: Yes Health Literacy - How often do you need to have someone help you when you read instructions, pamphlets, or other written material from your doctor or pharmacy?: Sometimes In the past 12 months, has lack of transportation kept you from medical appointments or from getting medications?: No In the past 12 months, has lack of transportation kept you from meetings, work, or from getting things needed for daily living?: No   Journalist, newspaper / Equipment Home Equipment: None   Prior Device Use: Indicate devices/aids used by the patient prior to current illness, exacerbation or injury? None of the above   Current Functional Level Cognition   Overall Cognitive Status: Within Functional Limits for tasks assessed Orientation Level: Oriented X4 General Comments: improvmement with impulsive movements    Extremity Assessment (includes Sensation/Coordination)   Upper Extremity Assessment: Right hand dominant, RUE deficits/detail, LUE deficits/detail RUE Deficits / Details: 3+/5 distal vs proximal weakness, can composite flex/ext digits and can sustain grasp for ~1 min during functional task RUE Sensation: decreased light touch RUE Coordination: decreased fine motor, decreased gross motor LUE Deficits / Details: pt able to make a first and release with incr time, minimal wrist movement, keeps in mildly flexed position. Keeps elbow bent 90*, no AROM or PROM, can abduct shoulder ~30* LUE  Coordination: decreased fine motor, decreased gross  motor  Lower Extremity Assessment: Defer to PT evaluation RLE Deficits / Details: R knee in extension on entry, pt very concerned as she could not volitionally bend her knee, PT attempted to perform AAROM for knee flexion and met with increase resistance with PT's increased pressure, similar results with attempts for performing AAROM into R ankle dorsiflexion, however when pt moved to EoB her knee bent easily, pt then able to engage in AROM of knee flexion and extension with increased effort, RLE Sensation: decreased light touch RLE Coordination: decreased gross motor     ADLs   Overall ADL's : Needs assistance/impaired Eating/Feeding: Minimal assistance Grooming: Moderate assistance, Sitting Grooming Details (indicate cue type and reason): oral care sitting at sink Upper Body Bathing: Moderate assistance Lower Body Bathing: Maximal assistance Upper Body Dressing : Moderate assistance Lower Body Dressing: Maximal assistance Lower Body Dressing Details (indicate cue type and reason): able to self assist into figure 4 positioning for each LE.  education on not bending forward while seated eob for fall prevention Toilet Transfer: Minimal assistance, Stand-pivot, BSC/3in1 Toileting- Clothing Manipulation and Hygiene: Minimal assistance Functional mobility during ADLs: Minimal assistance General ADL Comments: cues for relaxation tech. including PLB. pt. with visible tension B shoulders raised and holding breath.  responded well to tech. and was able to imlement the breathing stratgies during in room ambulation     Mobility   Overal bed mobility: Needs Assistance Bed Mobility: Supine to Sit Supine to sit: Contact guard General bed mobility comments: CGA for safety with increased time     Transfers   Overall transfer level: Needs assistance Equipment used: Left platform walker, None Transfers: Sit to/from Stand, Bed to  chair/wheelchair/BSC Sit to Stand: Min assist, Contact guard assist Bed to/from chair/wheelchair/BSC transfer type:: Step pivot Stand pivot transfers: Min assist Step pivot transfers: Min assist General transfer comment: min A to boost and steady on rise with initial stand to PFRW, fading to CGA with practice, min A to step pivot to chair at end of session without UE support     Ambulation / Gait / Stairs / Wheelchair Mobility   Ambulation/Gait Ambulation/Gait assistance: Mod assist, Min assist Gait Distance (Feet): 24 Feet (x2) Assistive device: Left platform walker Gait Pattern/deviations: Step-to pattern, Decreased stance time - right, Decreased step length - right, Decreased dorsiflexion - right, Decreased weight shift to right, Ataxic General Gait Details: PFRW utilized as pt with flexed L elbow and unable to extend arm to use standard RW, mod A fading to min A with distance to steady and manage RW, cues for wider BOS as pt with contineud RLE adduction during swing phase Gait velocity: decr     Posture / Balance Dynamic Sitting Balance Sitting balance - Comments: frequent moving when seated EOB needing CGA for safety' Balance Overall balance assessment: Needs assistance Sitting-balance support: Bilateral upper extremity supported, Feet supported Sitting balance-Leahy Scale: Fair Sitting balance - Comments: frequent moving when seated EOB needing CGA for safety' Standing balance support: Bilateral upper extremity supported, During functional activity, Reliant on assistive device for balance Standing balance-Leahy Scale: Poor Standing balance comment: reliant on external support     Special needs/care consideration Seizure precautions Service Dog for seizure detection; He will visit for short times only    Previous Home Environment  Living Arrangements:  Durene Cal for about 1 1/2 years)  Lives With:  Durene Cal) Available Help at Discharge: Family, Available 24 hours/day (longest time  may be alone is 2 to 4 hrs) Type of Home:  Apartment Home Layout: One level Home Access: Stairs to enter Entrance Stairs-Rails: Can reach both Entrance Stairs-Number of Steps:  (3rd floor apartment currently) Bathroom Shower/Tub: Engineer, manufacturing systems: Standard Bathroom Accessibility: Yes How Accessible: Accessible via walker Home Care Services: No Additional Comments: Hilda Lias RN CM, to provide [place] with note to give apartment complex to request first floor apartment   Discharge Living Setting Plans for Discharge Living Setting: Lives with (comment), Apartment ([name]) Type of Home at Discharge: Apartment Discharge Home Layout: One level Discharge Home Access: Stairs to enter Entrance Stairs-Rails: Right, Left, Can reach both Entrance Stairs-Number of Steps: 3rd floor apartment Discharge Bathroom Shower/Tub: Tub/shower unit Discharge Bathroom Toilet: Standard Discharge Bathroom Accessibility: Yes How Accessible: Accessible via walker Does the patient have any problems obtaining your medications?: No   Social/Family/Support Systems Patient Roles: Parent, Airline pilot Information: Durene Cal, Boyfriend, is primary contact Anticipated Caregiver: Durene Cal and her daughter in Social worker and hired caregiver for about 2 hrs per day Anticipated Caregiver's Contact Information: see contacts Ability/Limitations of Caregiver: Durene Cal works 630 until 4, dtr in Social worker works as Child psychotherapist 3 days per week; hired caregiver likely good be ther 2 hrs per day when noone in family there. Longest span likely 2 hrs without caregiver Caregiver Availability: 24/7 Discharge Plan Discussed with Primary Caregiver: Yes Is Caregiver In Agreement with Plan?: Yes Does Caregiver/Family have Issues with Lodging/Transportation while Pt is in Rehab?: No   Goals Patient/Family Goal for Rehab: Mod I to supervision with PT, OT and SLP Expected length of stay: ELOS 10 to 14 days Additional Information: Service Dog,  Misty , to visit with [facility] for short visits. He is trained to detect seizures Pt/Family Agrees to Admission and willing to participate: Yes Program Orientation Provided & Reviewed with Pt/Caregiver Including Roles  & Responsibilities: Yes   Decrease burden of Care through IP rehab admission: n/a   Possible need for SNF placement upon discharge: not anticipated   Patient Condition: I have reviewed medical records from [facility], spoken with CM, and patient and spouse. I met with patient at the bedside and discussed via phone for inpatient rehabilitation assessment.  Patient will benefit from ongoing PT, OT, and SLP, can actively participate in 3 hours of therapy a day 5 days of the week, and can make measurable gains during the admission.  Patient will also benefit from the coordinated team approach during an Inpatient Acute Rehabilitation admission.  The patient will receive intensive therapy as well as Rehabilitation physician, nursing, social worker, and care management interventions.  Due to bladder management, bowel management, safety, skin/wound care, disease management, medication administration, pain management, and patient education the patient requires 24 hour a day rehabilitation nursing.  The patient is currently mod assist overall with mobility and basic ADLs.  Discharge setting and therapy post discharge at home with home health is anticipated.  Patient has agreed to participate in the Acute Inpatient Rehabilitation Program and will admit today.   Preadmission Screen Completed By:  Clois Dupes, RN MSN 05/01/2023 11:27 AM ______________________________________________________________________   Discussed status with Dr. Natale Lay on 05/01/23 at 1026 and received approval for admission today.   Admission Coordinator:  Clois Dupes, RN MSN time 1026 Date 05/01/23    Assessment/Plan: Diagnosis: acute left pontine infarct  Does the need for close, 24 hr/day  Medical supervision in concert with the patient's rehab needs make it unreasonable for this patient to be served in a less intensive setting? Yes Co-Morbidities requiring supervision/potential complications:  Adrenal insufficiency, addison's, hypokalemia, Dehydration, peptic ulcer disease history, dehydration, headache, opioid dependence, seizures, HTN, Due to bladder management, bowel management, safety, skin/wound care, disease management, medication administration, pain management, and patient education, does the patient require 24 hr/day rehab nursing? Yes Does the patient require coordinated care of a physician, rehab nurse, PT, OT, and SLP to address physical and functional deficits in the context of the above medical diagnosis(es)? Yes Addressing deficits in the following areas: balance, endurance, locomotion, strength, transferring, bowel/bladder control, bathing, dressing, feeding, grooming, toileting, cognition, speech, language, and psychosocial support Can the patient actively participate in an intensive therapy program of at least 3 hrs of therapy 5 days a week? Yes The potential for patient to make measurable gains while on inpatient rehab is excellent Anticipated functional outcomes upon discharge from inpatient rehab: modified independent PT, modified independent OT, modified independent SLP Estimated rehab length of stay to reach the above functional goals is: 1-14 Anticipated discharge destination: Home 10. Overall Rehab/Functional Prognosis: excellent     MD Signature: Fanny Dance          Revision History

## 2023-05-02 DIAGNOSIS — G8929 Other chronic pain: Secondary | ICD-10-CM

## 2023-05-02 DIAGNOSIS — F1129 Opioid dependence with unspecified opioid-induced disorder: Secondary | ICD-10-CM

## 2023-05-02 DIAGNOSIS — I639 Cerebral infarction, unspecified: Secondary | ICD-10-CM | POA: Diagnosis not present

## 2023-05-02 DIAGNOSIS — M25512 Pain in left shoulder: Secondary | ICD-10-CM | POA: Diagnosis not present

## 2023-05-02 DIAGNOSIS — G5621 Lesion of ulnar nerve, right upper limb: Secondary | ICD-10-CM

## 2023-05-02 LAB — COMPREHENSIVE METABOLIC PANEL
ALT: 22 U/L (ref 0–44)
AST: 29 U/L (ref 15–41)
Albumin: 2.9 g/dL — ABNORMAL LOW (ref 3.5–5.0)
Alkaline Phosphatase: 40 U/L (ref 38–126)
Anion gap: 8 (ref 5–15)
BUN: 21 mg/dL — ABNORMAL HIGH (ref 6–20)
CO2: 25 mmol/L (ref 22–32)
Calcium: 8.4 mg/dL — ABNORMAL LOW (ref 8.9–10.3)
Chloride: 106 mmol/L (ref 98–111)
Creatinine, Ser: 0.87 mg/dL (ref 0.44–1.00)
GFR, Estimated: >60 mL/min (ref >60)
Glucose, Bld: 122 mg/dL — ABNORMAL HIGH (ref 70–99)
Potassium: 3.4 mmol/L — ABNORMAL LOW (ref 3.5–5.1)
Sodium: 139 mmol/L (ref 135–145)
Total Bilirubin: 0.5 mg/dL (ref 0.0–1.2)
Total Protein: 5.7 g/dL — ABNORMAL LOW (ref 6.5–8.1)

## 2023-05-02 LAB — CBC WITH DIFFERENTIAL/PLATELET
Abs Immature Granulocytes: 0.02 10*3/uL (ref 0.00–0.07)
Basophils Absolute: 0.1 10*3/uL (ref 0.0–0.1)
Basophils Relative: 1 %
Eosinophils Absolute: 0.1 10*3/uL (ref 0.0–0.5)
Eosinophils Relative: 2 %
HCT: 35.4 % — ABNORMAL LOW (ref 36.0–46.0)
Hemoglobin: 11.7 g/dL — ABNORMAL LOW (ref 12.0–15.0)
Immature Granulocytes: 0 %
Lymphocytes Relative: 26 %
Lymphs Abs: 2 10*3/uL (ref 0.7–4.0)
MCH: 31 pg (ref 26.0–34.0)
MCHC: 33.1 g/dL (ref 30.0–36.0)
MCV: 93.7 fL (ref 80.0–100.0)
Monocytes Absolute: 0.7 10*3/uL (ref 0.1–1.0)
Monocytes Relative: 9 %
Neutro Abs: 4.7 10*3/uL (ref 1.7–7.7)
Neutrophils Relative %: 62 %
Platelets: 209 10*3/uL (ref 150–400)
RBC: 3.78 MIL/uL — ABNORMAL LOW (ref 3.87–5.11)
RDW: 12.6 % (ref 11.5–15.5)
WBC: 7.5 10*3/uL (ref 4.0–10.5)
nRBC: 0 % (ref 0.0–0.2)

## 2023-05-02 LAB — PROTEIN S, TOTAL: Protein S Ag, Total: 51 % — ABNORMAL LOW (ref 60–150)

## 2023-05-02 LAB — PROTEIN S, ANTIGEN, FREE: Protein S Ag, Free: 83 % (ref 61–136)

## 2023-05-02 LAB — PROTEIN S ACTIVITY: Protein S Activity: 76 % (ref 63–140)

## 2023-05-02 NOTE — Plan of Care (Signed)
Problem: RH Balance Goal: LTG: Patient will maintain dynamic sitting balance (OT) Description: LTG:  Patient will maintain dynamic sitting balance with assistance during activities of daily living (OT) Flowsheets (Taken 05/02/2023 1328) LTG: Pt will maintain dynamic sitting balance during ADLs with: Supervision/Verbal cueing Goal: LTG Patient will maintain dynamic standing with ADLs (OT) Description: LTG:  Patient will maintain dynamic standing balance with assist during activities of daily living (OT)  Flowsheets (Taken 05/02/2023 1328) LTG: Pt will maintain dynamic standing balance during ADLs with: Contact Guard/Touching assist   Problem: Sit to Stand Goal: LTG:  Patient will perform sit to stand in prep for activites of daily living with assistance level (OT) Description: LTG:  Patient will perform sit to stand in prep for activites of daily living with assistance level (OT) Flowsheets (Taken 05/02/2023 1328) LTG: PT will perform sit to stand in prep for activites of daily living with assistance level: Supervision/Verbal cueing   Problem: RH Eating Goal: LTG Patient will perform eating w/assist, cues/equip (OT) Description: LTG: Patient will perform eating with assist, with/without cues using equipment (OT) Flowsheets (Taken 05/02/2023 1328) LTG: Pt will perform eating with assistance level of: Independent   Problem: RH Grooming Goal: LTG Patient will perform grooming w/assist,cues/equip (OT) Description: LTG: Patient will perform grooming with assist, with/without cues using equipment (OT) Flowsheets (Taken 05/02/2023 1328) LTG: Pt will perform grooming with assistance level of: Independent   Problem: RH Bathing Goal: LTG Patient will bathe all body parts with assist levels (OT) Description: LTG: Patient will bathe all body parts with assist levels (OT) Flowsheets (Taken 05/02/2023 1328) LTG: Pt will perform bathing with assistance level/cueing: Contact Guard/Touching assist    Problem: RH Dressing Goal: LTG Patient will perform upper body dressing (OT) Description: LTG Patient will perform upper body dressing with assist, with/without cues (OT). Flowsheets (Taken 05/02/2023 1328) LTG: Pt will perform upper body dressing with assistance level of: Set up assist Goal: LTG Patient will perform lower body dressing w/assist (OT) Description: LTG: Patient will perform lower body dressing with assist, with/without cues in positioning using equipment (OT) Flowsheets (Taken 05/02/2023 1328) LTG: Pt will perform lower body dressing with assistance level of: Supervision/Verbal cueing   Problem: RH Toileting Goal: LTG Patient will perform toileting task (3/3 steps) with assistance level (OT) Description: LTG: Patient will perform toileting task (3/3 steps) with assistance level (OT)  Flowsheets (Taken 05/02/2023 1328) LTG: Pt will perform toileting task (3/3 steps) with assistance level: Supervision/Verbal cueing   Problem: RH Functional Use of Upper Extremity Goal: LTG Patient will use RT/LT upper extremity as a (OT) Description: LTG: Patient will use right/left upper extremity as a stabilizer/gross assist/diminished/nondominant/dominant level with assist, with/without cues during functional activity (OT) Flowsheets (Taken 05/02/2023 1328) LTG: Use of upper extremity in functional activities: LUE as diminished level LTG: Pt will use upper extremity in functional activity with assistance level of: Independent   Problem: RH Toilet Transfers Goal: LTG Patient will perform toilet transfers w/assist (OT) Description: LTG: Patient will perform toilet transfers with assist, with/without cues using equipment (OT) Flowsheets (Taken 05/02/2023 1328) LTG: Pt will perform toilet transfers with assistance level of: Contact Guard/Touching assist   Problem: RH Tub/Shower Transfers Goal: LTG Patient will perform tub/shower transfers w/assist (OT) Description: LTG: Patient will perform  tub/shower transfers with assist, with/without cues using equipment (OT) Flowsheets (Taken 05/02/2023 1328) LTG: Pt will perform tub/shower stall transfers with assistance level of: Minimal Assistance - Patient > 75% LTG: Pt will perform tub/shower transfers from: Tub/shower combination  Problem: RH Awareness Goal: LTG: Patient will demonstrate awareness during functional activites type of (OT) Description: LTG: Patient will demonstrate awareness during functional activites type of (OT) Flowsheets (Taken 05/02/2023 1328) Patient will demonstrate awareness during functional activites type of: Intellectual LTG: Patient will demonstrate awareness during functional activites type of (OT): Supervision

## 2023-05-02 NOTE — Progress Notes (Signed)
Inpatient Rehabilitation  Patient information reviewed and entered into eRehab system by Cheri Rous, OTR/L, Rehab Quality Coordinator.   Information including medical coding, functional ability and quality indicators will be reviewed and updated through discharge.

## 2023-05-02 NOTE — Evaluation (Signed)
Speech Language Pathology Assessment and Plan  Patient Details  Name: Brittany Mullins MRN: [id number] Date of Birth: 08-28-1980  SLP Diagnosis: Dysarthria;Dysphagia;Cognitive Impairments  Rehab Potential: Good ELOS: 10-14 days    Today's Date: 05/02/2023 SLP Individual Time: 7829-5621 SLP Individual Time Calculation (min): 59 min   Hospital Problem: Principal Problem:   Left pontine stroke [facility])  Past Medical History:  Past Medical History:  Diagnosis Date   Addison's disease (HCC)    Brain tumor (benign) (HCC)    Chronic pancreatitis (HCC)    Hearing loss    Hypertension    Marijuana use    Per pt: "medical marijuana patient"   Pancreatitis    PCOS (polycystic ovarian syndrome)    Pituitary tumor    Seizures (HCC)    Past Surgical History:  Past Surgical History:  Procedure Laterality Date   ABDOMINAL HYSTERECTOMY     APPENDECTOMY     BIOPSY  08/16/2021   Procedure: BIOPSY;  Surgeon: Shellia Cleverly, DO;  Location: [facility] ENDOSCOPY;  Service: Gastroenterology;;   CESAREAN SECTION     CHOLECYSTECTOMY     ELBOW SURGERY     ESOPHAGOGASTRODUODENOSCOPY (EGD) WITH PROPOFOL N/A 08/16/2021   Procedure: ESOPHAGOGASTRODUODENOSCOPY (EGD) WITH PROPOFOL;  Surgeon: Shellia Cleverly, DO;  Location: [facility] ENDOSCOPY;  Service: Gastroenterology;  Laterality: N/A;   kidney stent     pancreatic stent     RADIOLOGY WITH ANESTHESIA N/A 04/25/2023   Procedure: MRI of Brain without contrast;  Surgeon: Radiologist, Medication, MD;  Location: [facility] OR;  Service: Radiology;  Laterality: N/A;    Assessment / Plan / Recommendation Clinical Impression HPI: 43 year old female with history of gastritis/duodenitis, Addison's disease, pituitary tumor, empty sells syndrome, HTN, opiate dependence on methadone, bipolar disease, THC use, PCOS, seizures presented to the ED on 04/22/23 with right sided weakness and numbness.    MRI showed left pontine infarct. CTA no LVO. No interventions due to  outside of window. UDS positive THC. DVT prophylaxis with lovenox. Placed on ASA and Plavix for 3 weeks  and then ASA alone. Keppra continued as at home. Typically has 1 to 2 seizures per month but can increase to 1-2 seizures a week. Has service Dog for seizure detection. On no home meds for HTN. Placed on Norvasc and Cozaar. LDL 94 and placed on Lipitor. On methadone at home due to pain and nausea from chronic pancreatitis. Estrogen for PCOS.   Developed increased left sided weakness. Stroke team recommended repeat CT and  EEG. No evidence of definite seizures.  Keppra dose adjusted. Noted left upper extremity hypertonia flexion which had also raised concerns for seizure.   Clinical Impression:  Bedside Swallow Evaluation: A bedside swallow evaluation was completed to assess oropharyngeal dysphagia. Oral mechanism exam revealed reduced R facial sensation and reduced lingual strength. POs offered consisted of thin liquids, purees and solids. Patient with timely mastication and adequate oral clearance. Patient with immediate throat clear after solids with complaints of "sticking" in throat and occasional odynophagia. Patient reporting feeling "weakness on right side of throat." SLP recommended use of condiments and intentionally consuming softer solids. Recommend continuation of regular solids/thin liquids at this time with use of strategies and plan for MBS 2/3 to assess oropharyngeal swallow.  Communication/Cognition: Patient was evaluated via the Cognistat to assess cognitive-linguistic functioning. Patient with functional expressive and receptive language, however demonstrated moderate deficits in attention, memory and problem solving tasks. Patient became very emotional during evaluation with decline in performance as frustration increased.  Continue to evaluate cognition as behavior/emotional state fluctuates. Dysarthria: Patient with moderate dysarthria characterized by halting, effortful speech (no  s/sx of aphasia - likely poor breath support vs timing) with imprecise consonants. Patient approximately 90% intelligible at the sentence level.  Pt would benefit from skilled ST services to maximize communication, swallowing safety and cognition in order to maximize functional independence at d/c. Anticipate patient will require 24 hour supervision at d/c and f/u SLP services.    Skilled Therapeutic Interventions          Patient evaluated using a standardized cognitive linguistic assessment and bedside swallow evaluation to assess current cognitive, communicative and swallowing function. See above for details.    SLP Assessment  Patient will need skilled Speech Lanaguage Pathology Services during CIR admission    Recommendations  Recommended Consults: Consider esophageal assessment SLP Diet Recommendations: Age appropriate regular solids;Thin Liquid Administration via: Straw;Cup Medication Administration: Whole meds with liquid Supervision: Patient able to self feed;Intermittent supervision to cue for compensatory strategies Compensations: Minimize environmental distractions;Slow rate;Small sips/bites Postural Changes and/or Swallow Maneuvers: Out of bed for meals;Seated upright 90 degrees;Upright 30-60 min after meal Oral Care Recommendations: Oral care BID Recommendations for Other Services: Neuropsych consult Patient destination: Home Follow up Recommendations: Home Health SLP;Outpatient SLP Equipment Recommended: None recommended by SLP    SLP Frequency 3 to 5 out of 7 days   SLP Duration  SLP Intensity  SLP Treatment/Interventions 10-14 days  Minumum of 1-2 x/day, 30 to 90 minutes  Cognitive remediation/compensation;Dysphagia/aspiration precaution training;Internal/external aids;Speech/Language facilitation;Cueing hierarchy;Therapeutic Activities;Functional tasks;Patient/family education    Pain Headache, nursing aware  SLP Evaluation Cognition Overall Cognitive Status:  Impaired/Different from baseline Arousal/Alertness: Awake/alert Orientation Level: Oriented X4 Year: 2025 Month: January Day of Week: Correct Attention: Sustained Sustained Attention: Impaired Sustained Attention Impairment: Verbal basic;Functional basic Memory: Impaired Memory Impairment: Decreased short term memory Decreased Short Term Memory: Verbal basic;Functional basic Awareness: Appears intact Problem Solving: Impaired Problem Solving Impairment: Verbal basic;Functional basic Behaviors: Impulsive;Restless;Poor frustration tolerance Safety/Judgment: Appears intact Comprehension Auditory Comprehension Overall Auditory Comprehension: Appears within functional limits for tasks assessed Expression Expression Primary Mode of Expression: Verbal Verbal Expression Overall Verbal Expression: Appears within functional limits for tasks assessed Written Expression Dominant Hand: Right Oral Motor Oral Motor/Sensory Function Overall Oral Motor/Sensory Function: Mild impairment Facial Symmetry: Abnormal symmetry right;Suspected CN VII (facial) dysfunction Facial Strength: Within Functional Limits Facial Sensation: Other (Comment);Reduced right (reduced sensation R side) Lingual ROM: Within Functional Limits Lingual Symmetry: Within Functional Limits Lingual Strength: Reduced Velum: Within Functional Limits Mandible: Within Functional Limits Motor Speech Overall Motor Speech: Impaired Respiration: Within functional limits Phonation: Normal Resonance: Within functional limits Articulation: Impaired Level of Impairment: Phrase Intelligibility: Intelligibility reduced Phrase: 75-100% accurate Sentence: 75-100% accurate Motor Planning: Witnin functional limits Motor Speech Errors: Not applicable  Care Tool Care Tool Cognition Ability to hear (with hearing aid or hearing appliances if normally used Ability to hear (with hearing aid or hearing appliances if normally used): 0.  Adequate - no difficulty in normal conservation, social interaction, listening to TV   Expression of Ideas and Wants Expression of Ideas and Wants: 4. Without difficulty (complex and basic) - expresses complex messages without difficulty and with speech that is clear and easy to understand   Understanding Verbal and Non-Verbal Content Understanding Verbal and Non-Verbal Content: 3. Usually understands - understands most conversations, but misses some part/intent of message. Requires cues at times to understand  Memory/Recall Ability Memory/Recall Ability : Current season;Staff names and faces;That he or she  is in a hospital/hospital unit   Bedside Swallowing Assessment General Previous Swallow Assessment: none Diet Prior to this Study: Regular;Thin liquids (Level 0) Respiratory Status: Room air Behavior/Cognition: Alert;Cooperative;Distractible;Requires cueing Oral Cavity - Dentition: Dentures, top Self-Feeding Abilities: Able to feed self Patient Positioning: Upright in bed Baseline Vocal Quality: Normal Volitional Cough: Strong Volitional Swallow: Able to elicit  Ice Chips Ice chips: Not tested Thin Liquid Thin Liquid: Within functional limits Presentation: Cup;Self Fed;Straw Nectar Thick Nectar Thick Liquid: Not tested Honey Thick Honey Thick Liquid: Not tested Puree Puree: Within functional limits Presentation: Self Fed;Spoon Solid Solid: Impaired Presentation: Self Fed Pharyngeal Phase Impairments: Throat Clearing - Immediate Other Comments: reports feeling like solids get stuck BSE Assessment Risk for Aspiration Impact on safety and function: Mild aspiration risk Other Related Risk Factors: Other (comment) (extensive medical hx)  Short Term Goals: Week 1: SLP Short Term Goal 1 (Week 1): Patient will participate in a MBS to assess oropharyngeal swallowing function SLP Short Term Goal 2 (Week 1): Patient will increase speech intelligibility to 100% at the sentence level  given min multimodal A SLP Short Term Goal 3 (Week 1): Patient will demonstrate problem solving abilities in mildly complex daily situations given min multimodal A SLP Short Term Goal 4 (Week 1): Patient will recall and utilize memory strategies given min multimodal A SLP Short Term Goal 5 (Week 1): Patient will sustain attention to tasks for 10 minutes at a time with min multimodal A  Refer to Care Plan for Long Term Goals  Recommendations for other services: Neuropsych  Discharge Criteria: Patient will be discharged from SLP if patient refuses treatment 3 consecutive times without medical reason, if treatment goals not met, if there is a change in medical status, if patient makes no progress towards goals or if patient is discharged from hospital.  The above assessment, treatment plan, treatment alternatives and goals were discussed and mutually agreed upon: by patient  Cheryel Kyte M.A., CF-SLP 05/02/2023, 4:04 PM

## 2023-05-02 NOTE — Evaluation (Signed)
Occupational Therapy Assessment and Plan  Patient Details  Name: Brittany Mullins MRN: [id number] Date of Birth: 09-Feb-1981  OT Diagnosis: acute pain, apraxia, cognitive deficits, disturbance of vision, hemiplegia affecting dominant side, and muscle weakness (generalized) Rehab Potential: Rehab Potential (ACUTE ONLY): Fair ELOS: 14-16 days   Today's Date: 05/02/2023 OT Individual Time: 4098-1191 OT Individual Time Calculation (min): 75 min     Hospital Problem: Principal Problem:   Left pontine stroke [facility])   Past Medical History:  Past Medical History:  Diagnosis Date   Addison's disease (HCC)    Brain tumor (benign) (HCC)    Chronic pancreatitis (HCC)    Hearing loss    Hypertension    Marijuana use    Per pt: "medical marijuana patient"   Pancreatitis    PCOS (polycystic ovarian syndrome)    Pituitary tumor    Seizures (HCC)    Past Surgical History:  Past Surgical History:  Procedure Laterality Date   ABDOMINAL HYSTERECTOMY     APPENDECTOMY     BIOPSY  08/16/2021   Procedure: BIOPSY;  Surgeon: Shellia Cleverly, DO;  Location: [facility] ENDOSCOPY;  Service: Gastroenterology;;   CESAREAN SECTION     CHOLECYSTECTOMY     ELBOW SURGERY     ESOPHAGOGASTRODUODENOSCOPY (EGD) WITH PROPOFOL N/A 08/16/2021   Procedure: ESOPHAGOGASTRODUODENOSCOPY (EGD) WITH PROPOFOL;  Surgeon: Shellia Cleverly, DO;  Location: [facility] ENDOSCOPY;  Service: Gastroenterology;  Laterality: N/A;   kidney stent     pancreatic stent     RADIOLOGY WITH ANESTHESIA N/A 04/25/2023   Procedure: MRI of Brain without contrast;  Surgeon: Radiologist, Medication, MD;  Location: [facility] OR;  Service: Radiology;  Laterality: N/A;    Assessment & Plan Clinical Impression: . Brittany Mullins is a 43 year old RH- female with Addison's disease, PCOS, pancreatitis with chronic N/V, seizures, PUD/duodenitis, chronic pain (abdominal due to pancreatitis)- on methadone, bipolar d/o, marijuana use  who was admitted on  04/22/23 with reports of right sided numbness that started day before as well as reports of abnormal gait with complaints of left calf pain per family.  UDS positive for THC. CTA head/neck was negative for LVO. MRI brain done revealing acute paramedian left pontine infarct and ultrasound BLE dopplers were negative for DVT. She was noted to have slurred speech with intermittent aphasia and recommended DAPT X 3 weeks for stroke felt to be due to small vessel disease.    She has had issues with recurrent nausea as well as headaches. Orthostasis with hyperkalemia at admission treated with IVF. Patient without PCP and had stopped taking her prednisone (didn't like how it made her feel) and question recent neurological follow up. Dr. Karel Jarvis recommended by Dr. Roda Shutters. She was started on hydrocortisone 10 mg am/ 5 mg pm. Has required IV morphine for HA as well as IV dilaudid for post auricular pain with induration treated with warm compresses.  X ray of soft tissue neck done and was negative for mass Oxycodone added to help manage pain.     She also reported Left shoulder and left elbow pain,  X rays ordered 01/28  and were negative for subluxation, fracture or dislocation. She reports prior dislocations of her L shoulder in the past.  She reported increase in dysesthesias on 01/27 and CT head repeated which was negative for extension or hemorrhage. Neurology reconsulted as boyfriend reported 2 staring episodes with brief episodes of unresponsiveness followed by stiffness LUE 01/27 am question due to seizure. LT- EEG ordered which was  negative for seizures and Keppra increased to 750 mg TID per Dr. Pearlean Brownie.    Low protein S noted. Neurology recommending referral to hematology.    Therapy has been working with patient who is limited by pain LUE, ataxic gait with decreased weight shifting to the right, right blurry vision,  right sided weakness and is requiring min to mod assist overall. She was independent PTA. CIR  recommended due to functional decline.          Patient transferred to CIR on 05/01/2023 .    Patient currently requires mod with basic self-care skills secondary to muscle weakness and muscle joint tightness, motor apraxia, decreased visual acuity, decreased motor planning, decreased awareness, decreased problem solving, and decreased safety awareness, and decreased sitting balance, decreased standing balance, decreased postural control, hemiplegia, and decreased balance strategies.  Prior to hospitalization, patient could ADLs with modified independent .  Patient will benefit from skilled intervention to increase independence with basic self-care skills prior to discharge home with care partner.  Anticipate patient will require 24 hour supervision and follow up home health.  OT - End of Session Endurance Deficit: Yes OT Assessment Rehab Potential (ACUTE ONLY): Fair OT Barriers to Discharge: Inaccessible home environment;Decreased caregiver support OT Barriers to Discharge Comments: apt on 3rd floor, significant other works during the day OT Patient demonstrates impairments in the following area(s): Balance;Behavior;Cognition;Endurance;Motor;Pain;Perception;Safety;Sensory;Vision OT Basic ADL's Functional Problem(s): Eating;Grooming;Bathing;Dressing;Toileting OT Transfers Functional Problem(s): Toilet;Tub/Shower OT Additional Impairment(s): Fuctional Use of Upper Extremity OT Plan OT Intensity: Minimum of 1-2 x/day, 45 to 90 minutes OT Frequency: 5 out of 7 days OT Duration/Estimated Length of Stay: 14-16 days OT Treatment/Interventions: Balance/vestibular training;Discharge planning;Cognitive remediation/compensation;DME/adaptive equipment instruction;Disease mangement/prevention;Functional mobility training;Neuromuscular re-education;Psychosocial support;Patient/family education;Pain management;Self Care/advanced ADL retraining;UE/LE Strength taining/ROM;Therapeutic Exercise;Therapeutic  Activities;UE/LE Coordination activities;Visual/perceptual remediation/compensation OT Self Feeding Anticipated Outcome(s): independent OT Basic Self-Care Anticipated Outcome(s): set up UB self care, CGA LB self care OT Toileting Anticipated Outcome(s): CGA OT Bathroom Transfers Anticipated Outcome(s): CGA OT Recommendation Recommendations for Other Services: Neuropsych consult Patient destination: Home Follow Up Recommendations: Home health OT Equipment Recommended: Tub/shower bench   OT Evaluation Precautions/Restrictions  Precautions Precautions: Fall Restrictions Weight Bearing Restrictions Per Provider Order: No Vital Signs Therapy Vitals Pulse Rate:  (74) BP: (!) 126/92 Patient Position (if appropriate): Sitting Pain  Pt has been having chronic L shoulder pain, MD and RN aware Home Living/Prior Functioning Home Living Available Help at Discharge: Family, Available PRN/intermittently (works during the day) Type of Home: Apartment Home Access: Stairs to enter [employer] of Steps: 3 flights (working on paperwork to move to 1st floor apt) Entrance Stairs-Rails: Left Home Layout: One level Bathroom Shower/Tub: Tub/shower unit  Lives With: Significant other Prior Function Level of Independence: Independent with transfers, Independent with gait, Independent with basic ADLs, Independent with homemaking with ambulation  Able to Take Stairs?: Yes (was starting to have issues) Vision Baseline Vision/History: 1 Wears glasses (distance) Ability to See in Adequate Light: 1 Impaired Patient Visual Report: Blurring of vision (Right eye blurriness) Vision Assessment?: Yes Additional Comments: ocular mobility functional, no visual field deficits, R eye with blurry vision Perception  Perception: Impaired Perception-Other Comments: futher testing/assessment needed but perceptual difficuties with body positioning/awareness Praxis Praxis: Impaired Praxis Impairment  Details: Motor planning Cognition Cognition Overall Cognitive Status: Impaired/Different from baseline Arousal/Alertness: Awake/alert Orientation Level: Person;Place;Situation Person: Oriented Place: Oriented Situation: Oriented Memory: Appears intact (pt uses her own word association strategies to help remember things) Awareness: Impaired Awareness Impairment: Intellectual impairment (decreased awareness of  physical abilities and limitations depending on the task or situation) Problem Solving: Impaired Problem Solving Impairment: Functional basic Behaviors: Restless Safety/Judgment: Appears intact Comments: pt needing frequent cues to not lean forward in w/c with brakes off Brief Interview for Mental Status (BIMS) Repetition of Three Words (First Attempt): 3 Temporal Orientation: Year: Correct Temporal Orientation: Month: Accurate within 5 days Temporal Orientation: Day: Correct Recall: "Sock": Yes, no cue required Recall: "Blue": Yes, no cue required Recall: "Bed": Yes, no cue required BIMS Summary Score: 15 Sensation Sensation Light Touch: Appears Intact Hot/Cold: Appears Intact Proprioception: Impaired Detail Proprioception Impaired Details: Impaired RLE;Impaired RUE Stereognosis: Not tested Additional Comments: reports numbness in RLE and RUE Coordination Gross Motor Movements are Fluid and Coordinated: No Fine Motor Movements are Fluid and Coordinated: No Motor  Motor Motor: Hemiplegia;Ataxia;Abnormal postural alignment and control;Motor impersistence Motor - Skilled Clinical Observations: RUE/RLE weakness, LUE limited mostly in shoulder due to pain/immobility, sensory impairments in RUE/RLE  Trunk/Postural Assessment  Cervical Assessment Cervical Assessment: Within Functional Limits Thoracic Assessment Thoracic Assessment: Exceptions to [facility] (flexed posture) Lumbar Assessment Lumbar Assessment: Exceptions to [facility] (posterior tilt) Postural Control Postural  Control: Deficits on evaluation Trunk Control: decreased in standing Protective Responses: delayed and inadequate  Balance Balance Balance Assessed: Yes Static Sitting Balance Static Sitting - Level of Assistance: 5: Stand by assistance Dynamic Sitting Balance Dynamic Sitting - Level of Assistance: 4: Min assist Static Standing Balance Static Standing - Level of Assistance: 4: Min assist Dynamic Standing Balance Dynamic Standing - Level of Assistance: 3: Mod assist Extremity/Trunk Assessment RUE Assessment Active Range of Motion (AROM) Comments: WFL General Strength Comments: 4/5 LUE Assessment Passive Range of Motion (PROM) Comments: not tested due to shoulder pain , will further evaluate Active Range of Motion (AROM) Comments: sh flexion and abduction only to 40 degrees,  elbow extension -20 General Strength Comments: only tested L grasp strength which was 5/5  Care Tool Care Tool Self Care Eating   Eating Assist Level: Set up assist    Oral Care    Oral Care Assist Level: Independent with assistive device Assistive Device Comment: mod I sitting in wc at sink  Bathing   Body parts bathed by patient: Abdomen;Chest;Front perineal area;Right upper leg;Left upper leg;Left lower leg;Face;Left arm;Right arm Body parts bathed by helper: Right lower leg;Buttocks;Right arm;Left arm   Assist Level: Minimal Assistance - Patient > 75%    Upper Body Dressing(including orthotics)   What is the patient wearing?: Bra;Pull over shirt   Assist Level: Moderate Assistance - Patient 50 - 74%    Lower Body Dressing (excluding footwear)   What is the patient wearing?: Underwear/pull up;Pants Assist for lower body dressing: Moderate Assistance - Patient 50 - 74%    Putting on/Taking off footwear   What is the patient wearing?: Shoes;Socks Assist for footwear: Total Assistance - Patient < 25%       Care Tool Toileting Toileting activity   Assist for toileting: Maximal Assistance -  Patient 25 - 49%     Care Tool Bed Mobility Roll left and right activity   Roll left and right assist level: Moderate Assistance - Patient 50 - 74%    Sit to lying activity   Sit to lying assist level: Moderate Assistance - Patient 50 - 74%    Lying to sitting on side of bed activity   Lying to sitting on side of bed assist level: the ability to move from lying on the back to sitting on the side of the  bed with no back support.: Moderate Assistance - Patient 50 - 74%     Care Tool Transfers Sit to stand transfer   Sit to stand assist level: Moderate Assistance - Patient 50 - 74%    Chair/bed transfer   Chair/bed transfer assist level: Moderate Assistance - Patient 50 - 74%     Toilet transfer   Assist Level: Minimal Assistance - Patient > 75%     Care Tool Cognition  Expression of Ideas and Wants Expression of Ideas and Wants: 4. Without difficulty (complex and basic) - expresses complex messages without difficulty and with speech that is clear and easy to understand  Understanding Verbal and Non-Verbal Content Understanding Verbal and Non-Verbal Content: 3. Usually understands - understands most conversations, but misses some part/intent of message. Requires cues at times to understand   Memory/Recall Ability Memory/Recall Ability : Current season;Staff names and faces;That he or she is in a hospital/hospital unit   Refer to Care Plan for Long Term Goals  SHORT TERM GOAL WEEK 1 OT Short Term Goal 1 (Week 1): pt will demonstrate improved use of L shoulder to don shirt with min A. OT Short Term Goal 2 (Week 1): Pt will be able to stand with min A to pull pants over hips. OT Short Term Goal 3 (Week 1): pt will be able to safely use RW to ambulate to toilet with mod A.  Recommendations for other services: Neuropsych and Therapeutic Recreation  Stress management   Skilled Therapeutic Intervention ADL ADL Eating: Set up Grooming: Modified independent Where Assessed-Grooming:  Sitting at sink Upper Body Bathing: Supervision/safety Where Assessed-Upper Body Bathing: Shower Lower Body Bathing: Minimal assistance Where Assessed-Lower Body Bathing: Shower Upper Body Dressing: Moderate assistance Where Assessed-Upper Body Dressing: Wheelchair Lower Body Dressing: Moderate assistance Where Assessed-Lower Body Dressing: Wheelchair Toileting: Moderate assistance Where Assessed-Toileting: Teacher, adult education: Curator Method: Surveyor, minerals: Acupuncturist: Insurance underwriter Method: Warden/ranger: Transfer tub bench;Grab bars Mobility  Bed Mobility Bed Mobility: Rolling Right;Rolling Left;Supine to Sit;Sit to Supine Rolling Right: Moderate Assistance - Patient 50-74% Rolling Left: Moderate Assistance - Patient 50-74% Supine to Sit: Moderate Assistance - Patient 50-74% Sit to Supine: Moderate Assistance - Patient 50-74% Transfers Sit to Stand: Moderate Assistance - Patient 50-74% Stand to Sit: Minimal Assistance - Patient > 75%  Pt seen for initial evaluation and ADL training with a focus on safe mobility and use of BUEs during shower, shampooing hair, dressing, grooming at the sink. Pt did well using B hands actively.  She does have limited L shoulder range of motion.  Reviewed role of OT, discussed POC and pt's goals, and ELOS. Pt resting in w/c With all needs met and belt alarm set.     Discharge Criteria: Patient will be discharged from OT if patient refuses treatment 3 consecutive times without medical reason, if treatment goals not met, if there is a change in medical status, if patient makes no progress towards goals or if patient is discharged from hospital.  The above assessment, treatment plan, treatment alternatives and goals were discussed and mutually agreed upon: by patient  Evamae Rowen 05/02/2023, 1:24 PM

## 2023-05-02 NOTE — Progress Notes (Signed)
Patient ID: Brittany Mullins, female   DOB: [birth year], 43 y.o.   MRN: [id number]  [id number] spoke with pt s/o [name] to introduce self, explain role, and discuss discharge process. He is working on additional supports at home for pt. Confirms pt DIL will assist on days when she is not working, and has a friend from work who has a niece that he will pay to provide care as well. SW shared if pt is appropriate will submit PCS and CAP/DA referral, however, unable to determine care needs at this time. He is aware SW will provide updates as available.   Cecile Sheerer, MSW, LCSW Office: [phone] Cell: [phone] Fax: [phone]
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
## 2023-05-02 NOTE — Evaluation (Signed)
Physical Therapy Assessment and Plan  Patient Details  Name: Brittany Mullins MRN: [id number] Date of Birth: 1980/11/12  PT Diagnosis: Ataxic gait, Difficulty walking, Impaired cognition, Impaired sensation, Muscle weakness, and Pain in joint Rehab Potential: Good ELOS: 14-18 days   Today's Date: 05/02/2023 PT Individual Time: 9147-8295 PT Individual Time Calculation (min): 70 min    Hospital Problem: Principal Problem:   Left pontine stroke [facility])   Past Medical History:  Past Medical History:  Diagnosis Date   Addison's disease (HCC)    Brain tumor (benign) (HCC)    Chronic pancreatitis (HCC)    Hearing loss    Hypertension    Marijuana use    Per pt: "medical marijuana patient"   Pancreatitis    PCOS (polycystic ovarian syndrome)    Pituitary tumor    Seizures (HCC)    Past Surgical History:  Past Surgical History:  Procedure Laterality Date   ABDOMINAL HYSTERECTOMY     APPENDECTOMY     BIOPSY  08/16/2021   Procedure: BIOPSY;  Surgeon: Shellia Cleverly, DO;  Location: [facility];  Service: Gastroenterology;;   CESAREAN SECTION     CHOLECYSTECTOMY     ELBOW SURGERY     ESOPHAGOGASTRODUODENOSCOPY (EGD) WITH PROPOFOL N/A 08/16/2021   Procedure: ESOPHAGOGASTRODUODENOSCOPY (EGD) WITH PROPOFOL;  Surgeon: Shellia Cleverly, DO;  Location: [facility];  Service: Gastroenterology;  Laterality: N/A;   kidney stent     pancreatic stent     RADIOLOGY WITH ANESTHESIA N/A 04/25/2023   Procedure: MRI of Brain without contrast;  Surgeon: Radiologist, Medication, MD;  Location: [facility] OR;  Service: Radiology;  Laterality: N/A;    Assessment & Plan Clinical Impression: Patient is a 43 year old RH- female with Addison's disease, PCOS, pancreatitis with chronic N/V, seizures, PUD/duodenitis, chronic pain (abdominal due to pancreatitis)- on methadone, bipolar d/o, marijuana use  who was admitted on 04/22/23 with reports of right sided numbness that started day before as well  as reports of abnormal gait with complaints of left calf pain per family.  UDS positive for THC. CTA head/neck was negative for LVO. MRI brain done revealing acute paramedian left pontine infarct and ultrasound BLE dopplers were negative for DVT. She was noted to have slurred speech with intermittent aphasia and recommended DAPT X 3 weeks for stroke felt to be due to small vessel disease.    She has had issues with recurrent nausea as well as headaches. Orthostasis with hyperkalemia at admission treated with IVF. Patient without PCP and had stopped taking her prednisone (didn't like how it made her feel) and question recent neurological follow up. Dr. Karel Jarvis recommended by Dr. Roda Shutters. She was started on hydrocortisone 10 mg am/ 5 mg pm. Has required IV morphine for HA as well as IV dilaudid for post auricular pain with induration treated with warm compresses.  X ray of soft tissue neck done and was negative for mass Oxycodone added to help manage pain.     She also reported Left shoulder and left elbow pain,  X rays ordered 01/28  and were negative for subluxation, fracture or dislocation. She reports prior dislocations of her L shoulder in the past.  She reported increase in dysesthesias on 01/27 and CT head repeated which was negative for extension or hemorrhage. Neurology reconsulted as boyfriend reported 2 staring episodes with brief episodes of unresponsiveness followed by stiffness LUE 01/27 am question due to seizure. LT- EEG ordered which was negative for seizures and Keppra increased to 750 mg TID  per Dr. Pearlean Brownie.    Low protein S noted. Neurology recommending referral to hematology.    Therapy has been working with patient who is limited by pain LUE, ataxic gait with decreased weight shifting to the right, right blurry vision,  right sided weakness and is requiring min to mod assist overall. She was independent PTA. CIR recommended due to functional decline.  Patient transferred to CIR on 05/01/2023 .      Patient currently requires mod with mobility secondary to muscle weakness, decreased cardiorespiratoy endurance, impaired timing and sequencing, motor apraxia, ataxia, decreased coordination, and decreased motor planning, decreased motor planning, decreased awareness and decreased safety awareness, and decreased sitting balance, decreased standing balance, decreased postural control, hemiplegia, and decreased balance strategies.  Prior to hospitalization, patient was independent  with mobility and lived with Significant other in a Apartment home.  Home access is 3 flights (working on paperwork to move to 1st floor apt)Stairs to enter.  Patient will benefit from skilled PT intervention to maximize safe functional mobility, minimize fall risk, and decrease caregiver burden for planned discharge home with intermittent assist.  Anticipate patient will benefit from follow up [facility] at discharge.  PT - End of Session Activity Tolerance: Decreased this session Endurance Deficit: Yes PT Assessment Rehab Potential (ACUTE/IP ONLY): Good PT Barriers to Discharge: Decreased caregiver support;Home environment access/layout;Medication compliance;Behavior PT Barriers to Discharge Comments: has 3 flights of stairs to access apt (working on plan to move to first floor) PT Patient demonstrates impairments in the following area(s): Balance;Behavior;Endurance;Motor;Pain;Perception;Safety;Sensory;Skin Integrity PT Transfers Functional Problem(s): Bed to Chair;Bed Mobility;Car;Furniture PT Locomotion Functional Problem(s): Ambulation;Wheelchair Mobility;Stairs PT Plan PT Intensity: Minimum of 1-2 x/day ,45 to 90 minutes PT Frequency: 5 out of 7 days PT Duration Estimated Length of Stay: 14-18 days PT Treatment/Interventions: Ambulation/gait training;Balance/vestibular training;Cognitive remediation/compensation;Community reintegration;Discharge planning;Disease management/prevention;DME/adaptive equipment  instruction;Functional mobility training;Neuromuscular re-education;Pain management;Patient/family education;Psychosocial support;Skin care/wound management;Splinting/orthotics;Stair training;Therapeutic Activities;Therapeutic Exercise;UE/LE Strength taining/ROM;UE/LE Coordination activities;Visual/perceptual remediation/compensation;Wheelchair propulsion/positioning PT Transfers Anticipated Outcome(s): supervision/min assist PT Locomotion Anticipated Outcome(s): CGA/min assist gait and stairs PT Recommendation Recommendations for Other Services: Neuropsych consult;Therapeutic Recreation consult Therapeutic Recreation Interventions: Pet therapy;Kitchen group;Stress management Follow Up Recommendations: Home health PT;24 hour supervision/assistance Patient destination: Home Equipment Recommended: Wheelchair (measurements);Wheelchair cushion (measurements);Rolling walker with 5" wheels   PT Evaluation Precautions/Restrictions Precautions Precautions: Fall Restrictions Weight Bearing Restrictions Per Provider Order: No  Pain  Reports pain in L shoulder mainly and intermittent headaches - premedicated. States she did not sleep well last night.  Pain Interference Pain Interference Pain Effect on Sleep: 4. Almost constantly Pain Interference with Therapy Activities: 1. Rarely or not at all Pain Interference with Day-to-Day Activities: 2. Occasionally Home Living/Prior Functioning Home Living Available Help at Discharge: Family;Available PRN/intermittently (works during the day) Type of Home: Apartment Home Access: Stairs to enter [employer] of Steps: 3 flights (working on paperwork to move to 1st floor apt) Entrance Stairs-Rails: Left Home Layout: One level  Lives With: Significant other Prior Function Level of Independence: Independent with transfers;Independent with gait  Able to Take Stairs?: Yes (was starting to have issues) Vision/Perception  Perception Perception:  Impaired Perception-Other Comments: futher testing/assessment needed but perceptual difficuties with body positioning/awareness Praxis Praxis: Impaired Praxis Impairment Details: Motor planning  Cognition Overall Cognitive Status: Impaired/Different from baseline Arousal/Alertness: Awake/alert Memory: Appears intact (pt uses her own word association strategies to help remember things) Awareness: Impaired Awareness Impairment: Intellectual impairment (decreased awareness of physical abilities and limitations depending on the task or situation) Problem Solving: Impaired Problem Solving Impairment:  Functional basic Behaviors: Restless Safety/Judgment: Appears intact Comments: pt needing frequent cues to not lean forward in w/c with brakes off Sensation Sensation Light Touch: Appears Intact Proprioception: Impaired Detail Proprioception Impaired Details: Impaired RLE;Impaired RUE Additional Comments: reports numbness in RLE and RUE Coordination Gross Motor Movements are Fluid and Coordinated: No Fine Motor Movements are Fluid and Coordinated: No Motor  Motor Motor: Hemiplegia;Ataxia;Abnormal postural alignment and control;Motor impersistence Motor - Skilled Clinical Observations: RUE/RLE weakness, LUE limited mostly in shoulder due to pain/immobility, sensory impairments in RUE/RLE   Trunk/Postural Assessment  Cervical Assessment Cervical Assessment: Within Functional Limits Thoracic Assessment Thoracic Assessment: Exceptions to [facility] (flexed posture) Lumbar Assessment Lumbar Assessment: Exceptions to [facility] (posterior tilt) Postural Control Postural Control: Deficits on evaluation Trunk Control: decreased in standing Protective Responses: delayed and inadequate  Balance Balance Balance Assessed: Yes Static Sitting Balance Static Sitting - Level of Assistance: 5: Stand by assistance Dynamic Sitting Balance Dynamic Sitting - Level of Assistance: 4: Min assist Static Standing  Balance Static Standing - Level of Assistance: 4: Min assist Dynamic Standing Balance Dynamic Standing - Level of Assistance: 3: Mod assist Extremity Assessment      RLE Assessment RLE Assessment: Exceptions to [facility] General Strength Comments: grossly 3- to 3/5 LLE Assessment LLE Assessment: Exceptions to [facility] General Strength Comments: decreased muscular endurance; grossly 4/5  Care Tool Care Tool Bed Mobility Roll left and right activity   Roll left and right assist level: Moderate Assistance - Patient 50 - 74%    Sit to lying activity   Sit to lying assist level: Moderate Assistance - Patient 50 - 74%    Lying to sitting on side of bed activity   Lying to sitting on side of bed assist level: the ability to move from lying on the back to sitting on the side of the bed with no back support.: Moderate Assistance - Patient 50 - 74%     Care Tool Transfers Sit to stand transfer   Sit to stand assist level: Moderate Assistance - Patient 50 - 74%    Chair/bed transfer   Chair/bed transfer assist level: Moderate Assistance - Patient 50 - 74%    Car transfer   Car transfer assist level: Moderate Assistance - Patient 50 - 74%      Care Tool Locomotion Ambulation   Assist level: Moderate Assistance - Patient 50 - 74% Assistive device: No Device Max distance: 10  Walk 10 feet activity   Assist level: Moderate Assistance - Patient - 50 - 74% Assistive device: No Device   Walk 50 feet with 2 turns activity Walk 50 feet with 2 turns activity did not occur: Safety/medical concerns      Walk 150 feet activity Walk 150 feet activity did not occur: Safety/medical concerns      Walk 10 feet on uneven surfaces activity Walk 10 feet on uneven surfaces activity did not occur: Safety/medical concerns      Stairs   Assist level: Moderate Assistance - Patient - 50 - 74% Stairs assistive device: 1 hand rail Max number of stairs: 1  Walk up/down 1 step activity   Walk up/down 1 step  (curb) assist level: Moderate Assistance - Patient - 50 - 74% Walk up/down 1 step or curb assistive device: 1 hand rail  Walk up/down 4 steps activity Walk up/down 4 steps activity did not occur: Safety/medical concerns      Walk up/down 12 steps activity Walk up/down 12 steps activity did not occur: Safety/medical concerns  Pick up small objects from floor Pick up small object from the floor (from standing position) activity did not occur: Safety/medical concerns      Wheelchair Is the patient using a wheelchair?: Yes Type of Wheelchair: Manual     Max wheelchair distance: 50  Wheel 50 feet with 2 turns activity   Assist Level: Minimal Assistance - Patient > 75%  Wheel 150 feet activity   Assist Level: Maximal Assistance - Patient 25 - 49%    Refer to Care Plan for Long Term Goals  SHORT TERM GOAL WEEK 1 PT Short Term Goal 1 (Week 1): Pt wil perform bed mobilty from flat surface with min assist to the L to simulate home environment PT Short Term Goal 2 (Week 1): Pt will perform transfers with min assist PT Short Term Goal 3 (Week 1): Pt will perform 4 steps with min assist PT Short Term Goal 4 (Week 1): Pt will be able to gait x 50' with min assist  Recommendations for other services: Neuropsych and Therapeutic Recreation  Pet therapy, Kitchen group, and Stress management  Skilled Therapeutic Intervention Mobility Bed Mobility Bed Mobility: Rolling Right;Rolling Left;Supine to Sit;Sit to Supine Rolling Right: Moderate Assistance - Patient 50-74% Rolling Left: Moderate Assistance - Patient 50-74% Supine to Sit: Moderate Assistance - Patient 50-74% Sit to Supine: Moderate Assistance - Patient 50-74% Transfers Transfers: Sit to Stand;Stand to Sit;Stand Pivot Transfers Sit to Stand: Moderate Assistance - Patient 50-74% Stand to Sit: Minimal Assistance - Patient > 75% Stand Pivot Transfers: Moderate Assistance - Patient 50 - 74% Stand Pivot Transfer Details: Verbal cues  for sequencing;Verbal cues for technique;Verbal cues for precautions/safety;Manual facilitation for weight shifting;Tactile cues for posture;Tactile cues for sequencing Locomotion  Gait Gait Distance (Feet): 10 Feet Assistive device: None Gait Gait Pattern: Impaired Stairs / Additional Locomotion Stairs: Yes Stairs Assistance: Moderate Assistance - Patient 50 - 74% Stair Management Technique: One rail Right;Step to pattern Number of Stairs: 1 Height of Stairs: 3 Wheelchair Mobility Wheelchair Mobility: Yes Wheelchair Assistance: Minimal assistance - Patient >75% Wheelchair Propulsion: Both lower extermities;Right upper extremity Wheelchair Parts Management: Needs assistance Distance: 50  Evaluation completed (see details above ) with education on PT POC and goals and individual treatment initiated with focus on transfers to various surfaces including mat table, car, and w/c, introduction to stair negotiation, gait training with and without device, w/c mobility training, bed mobility on flat surface with focuse on NMR for technique and perceptual awareness of body in space. Pt performs transfers with overall min to mod assist due to cues for technique, hand placement, and overall safety with mobility. Pt slightly impulsive at times. Pt does require up to mod assist for bed mobility and cues for technique. Trial with RW x 15' with mod assist and cues for placement of RW , facilitation of weightshift, and cues for RLE placement - appears at times to be ataxic though may be weakness/compensatory. Pt noted to use LUE at times functionally but reports difficulty with her hands as one of her main deficits. Pt was able to grip walker though.     Discharge Criteria: Patient will be discharged from PT if patient refuses treatment 3 consecutive times without medical reason, if treatment goals not met, if there is a change in medical status, if patient makes no progress towards goals or if patient is  discharged from hospital.  The above assessment, treatment plan, treatment alternatives and goals were discussed and mutually agreed upon: by patient  Karolee Stamps  Darrol Poke, PT, DPT, CBIS  05/02/2023, 1:01 PM

## 2023-05-02 NOTE — Progress Notes (Signed)
Inpatient Rehabilitation Admission Medication Review by a Pharmacist  A complete drug regimen review was completed for this patient to identify any potential clinically significant medication issues.  High Risk Drug Classes Is patient taking? Indication by Medication  Antipsychotic Yes Compazine - nausea and vomiting  Anticoagulant Yes Lovenox - DVT prophylaxis  Antibiotic No   Opioid Yes Methadone and oxycodone - pain management  Antiplatelet Yes Aspirin and Plavix - stroke  Hypoglycemics/insulin No   Vasoactive Medication Yes Amlodipine and losartan - HTN  Chemotherapy No   Other Yes Atorvastatin - HLD Gabapentin - pain management Cortef - Addison's disease Keppra - seizures Alesse - birth control Pantoprazole - gastritis Senna S - constipation Phenergan - nausea and vomiting     Type of Medication Issue Identified Description of Issue Recommendation(s)  Drug Interaction(s) (clinically significant)     Duplicate Therapy     Allergy     No Medication Administration End Date     Incorrect Dose     Additional Drug Therapy Needed     Significant med changes from prior encounter (inform family/care partners about these prior to discharge). Keppra - dose increase   Alesse used for Vienva for birth control Make sure patient is educated regarding this dose increase prior to discharge Patient may return to using home BCP when discharged  Other       Clinically significant medication issues were identified that warrant physician communication and completion of prescribed/recommended actions by midnight of the next day:  No  Name of provider notified for urgent issues identified:   Provider Method of Notification:     Pharmacist comments:   Time spent performing this drug regimen review (minutes):  15   Jeanella Cara, PharmD, [place] Clinical Pharmacist Please see AMION for all Pharmacists' Contact Phone Numbers 05/02/2023, 7:17 AM

## 2023-05-02 NOTE — Plan of Care (Signed)
  Problem: RH Swallowing Goal: LTG Patient will consume least restrictive diet using compensatory strategies with assistance (SLP) Description: LTG:  Patient will consume least restrictive diet using compensatory strategies with assistance (SLP) Flowsheets (Taken 05/02/2023 1601) LTG: Pt Patient will consume least restrictive diet using compensatory strategies with assistance of (SLP): Supervision   Problem: RH Expression Communication Goal: LTG Patient will increase speech intelligibility (SLP) Description: LTG: Patient will increase speech intelligibility at word/phrase/conversation level with cues, % of the time (SLP) Flowsheets (Taken 05/02/2023 1601) LTG: Patient will increase speech intelligibility (SLP): Minimal Assistance - Patient > 75% Level: Conversation level Percent of time patient will use intelligible speech: 100   Problem: RH Problem Solving Goal: LTG Patient will demonstrate problem solving for (SLP) Description: LTG:  Patient will demonstrate problem solving for basic/complex daily situations with cues  (SLP) Flowsheets (Taken 05/02/2023 1601) LTG: Patient will demonstrate problem solving for (SLP): (mildly complex) Other (comment) LTG Patient will demonstrate problem solving for: Minimal Assistance - Patient > 75%   Problem: RH Memory Goal: LTG Patient will use memory compensatory aids to (SLP) Description: LTG:  Patient will use memory compensatory aids to recall biographical/new, daily complex information with cues (SLP) Flowsheets (Taken 05/02/2023 1601) LTG: Patient will use memory compensatory aids to (SLP): Minimal Assistance - Patient > 75%   Problem: RH Attention Goal: LTG Patient will demonstrate this level of attention during functional activites (SLP) Description: LTG:  Patient will will demonstrate this level of attention during functional activites (SLP) Flowsheets (Taken 05/02/2023 1601) Patient will demonstrate during cognitive/linguistic activities the  attention type of: Sustained LTG: Patient will demonstrate this level of attention during cognitive/linguistic activities with assistance of (SLP): Minimal Assistance - Patient > 75% Number of minutes patient will demonstrate attention during cognitive/linguistic activities: 10

## 2023-05-02 NOTE — Anesthesia Preprocedure Evaluation (Signed)
Anesthesia Evaluation  Patient identified by MRN, date of birth, ID band Patient awake    Reviewed: Allergy & Precautions, NPO status , Patient's Chart, lab work & pertinent test results  Airway Mallampati: III  TM Distance: >3 FB Neck ROM: Full    Dental  (+) Dental Advisory Given, Poor Dentition, Edentulous Upper,    Pulmonary neg pulmonary ROS   breath sounds clear to auscultation       Cardiovascular hypertension, Pt. on medications  Rhythm:Regular Rate:Tachycardia  ECHO 25 1. Left ventricular ejection fraction, by estimation, is 60 to 65%. The  left ventricle has normal function. The left ventricle has no regional  wall motion abnormalities. Left ventricular diastolic parameters were  normal.   2. Right ventricular systolic function is normal. The right ventricular  size is normal.   3. The mitral valve is normal in structure. No evidence of mitral valve  regurgitation. No evidence of mitral stenosis.   4. The aortic valve is normal in structure. Aortic valve regurgitation is  not visualized. No aortic stenosis is present.   5. The inferior vena cava is normal in size with greater than 50%  respiratory variability, suggesting right atrial pressure of 3 mmHg.      Neuro/Psych Pt denies h/o seizure  negative psych ROS   GI/Hepatic negative GI ROS, Neg liver ROS,,,  Endo/Other  negative endocrine ROS    Renal/GU Renal disease     Musculoskeletal negative musculoskeletal ROS (+)    Abdominal Normal abdominal exam  (+)   Peds  Hematology negative hematology ROS (+)   Anesthesia Other Findings   Reproductive/Obstetrics negative OB ROS                             Anesthesia Physical Anesthesia Plan  ASA: 3  Anesthesia Plan: General   Post-op Pain Management: Minimal or no pain anticipated   Induction: Intravenous  PONV Risk Score and Plan: 0 and Ondansetron, Dexamethasone and  Treatment may vary due to age or medical condition  Airway Management Planned: Oral ETT  Additional Equipment: None  Intra-op Plan:   Post-operative Plan: Extubation in OR  Informed Consent: I have reviewed the patients History and Physical, chart, labs and discussed the procedure including the risks, benefits and alternatives for the proposed anesthesia with the patient or authorized representative who has indicated his/her understanding and acceptance.       Plan Discussed with: CRNA and Anesthesiologist  Anesthesia Plan Comments: (  )        Anesthesia Quick Evaluation

## 2023-05-03 ENCOUNTER — Encounter (HOSPITAL_COMMUNITY)
Admission: AD | Disposition: A | Discharge status: Home Health | Payer: Self-pay | Source: Intra-hospital | Attending: Physical Medicine & Rehabilitation

## 2023-05-03 ENCOUNTER — Inpatient Hospital Stay (HOSPITAL_COMMUNITY): Payer: Medicare Other | Admitting: Certified Registered Nurse Anesthetist

## 2023-05-03 ENCOUNTER — Inpatient Hospital Stay (HOSPITAL_COMMUNITY): Payer: Medicare Other

## 2023-05-03 DIAGNOSIS — M5412 Radiculopathy, cervical region: Secondary | ICD-10-CM | POA: Diagnosis not present

## 2023-05-03 DIAGNOSIS — I10 Essential (primary) hypertension: Secondary | ICD-10-CM | POA: Diagnosis not present

## 2023-05-03 HISTORY — PX: RADIOLOGY WITH ANESTHESIA: SHX6223

## 2023-05-03 SURGERY — MRI WITH ANESTHESIA
Anesthesia: General

## 2023-05-03 MED ORDER — ACETAMINOPHEN 325 MG PO TABS: 325.0000 mg | ORAL_TABLET | ORAL | Status: DC | PRN

## 2023-05-03 MED ORDER — LACTATED RINGERS IV SOLN: INTRAVENOUS | Status: DC | PRN

## 2023-05-03 MED ORDER — MEPERIDINE HCL 25 MG/ML IJ SOLN: 6.2500 mg | INTRAMUSCULAR | Status: DC | PRN

## 2023-05-03 MED ORDER — ACETAMINOPHEN 160 MG/5ML PO SOLN: 325.0000 mg | ORAL | Status: DC | PRN

## 2023-05-03 MED ORDER — PREDNISONE 10 MG PO TABS
10.0000 mg | ORAL_TABLET | Freq: Every day | ORAL | Status: AC
  Administered 2023-05-07: 10 mg via ORAL
  Filled 2023-05-03: qty 1

## 2023-05-03 MED ORDER — PREDNISONE 20 MG PO TABS
20.0000 mg | ORAL_TABLET | Freq: Every day | ORAL | Status: AC
  Administered 2023-05-06: 20 mg via ORAL
  Filled 2023-05-03: qty 1

## 2023-05-03 MED ORDER — METHYLPREDNISOLONE 4 MG PO TBPK: 4.0000 mg | ORAL_TABLET | ORAL | Status: DC

## 2023-05-03 MED ORDER — SODIUM CHLORIDE 0.9% FLUSH: 3.0000 mL | Freq: Two times a day (BID) | INTRAVENOUS | Status: DC

## 2023-05-03 MED ORDER — METHYLPREDNISOLONE 4 MG PO TBPK: 4.0000 mg | ORAL_TABLET | Freq: Three times a day (TID) | ORAL | Status: DC

## 2023-05-03 MED ORDER — FENTANYL CITRATE (PF) 100 MCG/2ML IJ SOLN: 25.0000 ug | INTRAMUSCULAR | Status: DC | PRN

## 2023-05-03 MED ORDER — MIDAZOLAM HCL 2 MG/2ML IJ SOLN
INTRAMUSCULAR | Status: AC
  Filled 2023-05-03: qty 2

## 2023-05-03 MED ORDER — DEXAMETHASONE SODIUM PHOSPHATE 10 MG/ML IJ SOLN
INTRAMUSCULAR | Status: DC | PRN
  Administered 2023-05-03: 5 mg via INTRAVENOUS

## 2023-05-03 MED ORDER — PREDNISONE 10 MG PO TABS
5.0000 mg | ORAL_TABLET | Freq: Every day | ORAL | Status: AC
  Filled 2023-05-03: qty 1

## 2023-05-03 MED ORDER — ONDANSETRON HCL 4 MG/2ML IJ SOLN: 4.0000 mg | Freq: Once | INTRAMUSCULAR | Status: DC | PRN

## 2023-05-03 MED ORDER — PREDNISONE 20 MG PO TABS
30.0000 mg | ORAL_TABLET | Freq: Every day | ORAL | Status: AC
  Administered 2023-05-05: 30 mg via ORAL
  Filled 2023-05-03: qty 1

## 2023-05-03 MED ORDER — METHYLPREDNISOLONE 4 MG PO TBPK
8.0000 mg | ORAL_TABLET | Freq: Every morning | ORAL | Status: DC
  Filled 2023-05-03: qty 21

## 2023-05-03 MED ORDER — OXYCODONE HCL 5 MG PO TABS: 5.0000 mg | ORAL_TABLET | Freq: Once | ORAL | Status: DC | PRN

## 2023-05-03 MED ORDER — LIDOCAINE 2% (20 MG/ML) 5 ML SYRINGE
INTRAMUSCULAR | Status: DC | PRN
  Administered 2023-05-03: 100 mg via INTRAVENOUS

## 2023-05-03 MED ORDER — OXYCODONE HCL 5 MG/5ML PO SOLN: 5.0000 mg | Freq: Once | ORAL | Status: DC | PRN

## 2023-05-03 MED ORDER — PREDNISONE 20 MG PO TABS
50.0000 mg | ORAL_TABLET | Freq: Every day | ORAL | Status: AC
  Administered 2023-05-03: 50 mg via ORAL
  Filled 2023-05-03: qty 1

## 2023-05-03 MED ORDER — ONDANSETRON HCL 4 MG/2ML IJ SOLN
INTRAMUSCULAR | Status: DC | PRN
  Administered 2023-05-03: 4 mg via INTRAVENOUS

## 2023-05-03 MED ORDER — PREDNISONE 20 MG PO TABS
40.0000 mg | ORAL_TABLET | Freq: Every day | ORAL | Status: AC
  Administered 2023-05-04: 40 mg via ORAL
  Filled 2023-05-03: qty 2

## 2023-05-03 MED ORDER — SODIUM CHLORIDE 0.9% FLUSH
3.0000 mL | INTRAVENOUS | Status: DC | PRN
Start: 2023-05-03 — End: 2023-05-03

## 2023-05-03 MED ORDER — METHYLPREDNISOLONE 4 MG PO TBPK: 4.0000 mg | ORAL_TABLET | Freq: Four times a day (QID) | ORAL | Status: DC

## 2023-05-03 MED ORDER — ORAL CARE MOUTH RINSE: 15.0000 mL | Freq: Once | OROMUCOSAL | Status: DC

## 2023-05-03 MED ORDER — CHLORHEXIDINE GLUCONATE 0.12 % MT SOLN: 15.0000 mL | Freq: Once | OROMUCOSAL | Status: DC

## 2023-05-03 MED ORDER — MIDAZOLAM HCL 2 MG/2ML IJ SOLN
INTRAMUSCULAR | Status: DC | PRN
  Administered 2023-05-03: 2 mg via INTRAVENOUS

## 2023-05-03 MED ORDER — CHLORHEXIDINE GLUCONATE 0.12 % MT SOLN
OROMUCOSAL | Status: AC
  Filled 2023-05-03: qty 15

## 2023-05-03 MED ORDER — METHYLPREDNISOLONE 4 MG PO TBPK: 8.0000 mg | ORAL_TABLET | Freq: Every evening | ORAL | Status: DC

## 2023-05-03 MED ORDER — METHYLPREDNISOLONE 4 MG PO TBPK
8.0000 mg | ORAL_TABLET | Freq: Every evening | ORAL | Status: DC
Start: 2023-05-03 — End: 2023-05-03

## 2023-05-03 MED ORDER — PROPOFOL 10 MG/ML IV BOLUS
INTRAVENOUS | Status: DC | PRN
  Administered 2023-05-03: 150 mg via INTRAVENOUS

## 2023-05-03 NOTE — Progress Notes (Signed)
Physical Therapy Session Note  Patient Details  Name: Brittany Mullins MRN: [id number] Date of Birth: 1981-04-02  Today's Date: 05/03/2023 PT Individual Time: 1400-1433 PT Individual Time Calculation (min): 33 min   Short Term Goals: Week 1:  PT Short Term Goal 1 (Week 1): Pt wil perform bed mobilty from flat surface with min assist to the L to simulate home environment PT Short Term Goal 2 (Week 1): Pt will perform transfers with min assist PT Short Term Goal 3 (Week 1): Pt will perform 4 steps with min assist PT Short Term Goal 4 (Week 1): Pt will be able to gait x 50' with min assist  Skilled Therapeutic Interventions/Progress Updates: Pt presented in bed agreeable to therapy. Pt states still lethargic but more awake than earlier in day. Pt completed supine to sit with supervision and use of bed features to L. Per pt gets out of bed on L at home so would like to continue to practice that way to be able to progress to standard flat bed. Pt completed stand step transfer with RW and minA. Pt with difficulty extending L arm to RW therefore causing increased forward flexion to RW. Pt transported to main gym for time management and energy conservation. Completed stand step transfer to mat with no AD but PTA supporting pt at hips. Pt with decreased R stance time and difficulty maintaining TKE. Worked on standing balance/tolerance performed Sit to stand without AD and using RUE to pull and place Squigz on mat. Pt required tapping facilitation on R quad to achieve and maintain R TKE without hyperextension. Pt also worked on Sit to stand with LLE on 2in block to allow for increased R weight shifting and increased R closed chain ms recruitment x 5. PTA noted improved sequencing with repetition. Pt completed stand step transfer back to w/c in same manner as prior with improved weight shifting on R. Pt then handed off to SLP for next session with current needs met.      Therapy  Documentation Precautions:  Precautions Precautions: Fall Restrictions Weight Bearing Restrictions Per Provider Order: No General:   Vital Signs: Therapy Vitals Temp: 97.8 F (36.6 C) Temp Source: Oral Pulse Rate: 82 Resp: 16 BP: 121/81 Patient Position (if appropriate): Lying Oxygen Therapy SpO2: 95 % O2 Device: Room Air Pain: Pain Assessment Pain Scale: 0-10 Pain Score: 0-No pain   Therapy/Group: Individual Therapy  Marija Calamari 05/03/2023, 4:08 PM

## 2023-05-03 NOTE — Progress Notes (Signed)
Nurse entered room at 0558am this morning. Patient is upset and hands nurse an empty methadone bottle stating, " I found this in my boyfriends [employer], I knew he was asking strange so I went through his bag, he was sleeping to much." Nurse asked patient if she took any this morning. Patient stated, "no." Nurse had charge nurse Greggory Stallion come down to discuss with patient. Patient stated he son corey will be bringing the rest of her methodone. Chrage nurse left patient know that it will be held in pharmacy. Nurse observed what looked like a vape, nurse asked patient about it. Patient denied vape. Nurse looked in bed, side tables, clothing and could not find it. Patient also stated there was chewing tobacco that belonged to her boyfriend and he took it with him.

## 2023-05-03 NOTE — Anesthesia Procedure Notes (Signed)
Procedure Name: LMA Insertion Date/Time: 05/03/2023 8:27 AM  Performed by: Sudie Grumbling, CRNAPre-anesthesia Checklist: Patient identified, Emergency Drugs available, Suction available, Patient being monitored and Timeout performed Patient Re-evaluated:Patient Re-evaluated prior to induction Oxygen Delivery Method: Circle system utilized Preoxygenation: Pre-oxygenation with 100% oxygen Induction Type: IV induction LMA: LMA inserted LMA Size: 4.0 Number of attempts: 1 Tube secured with: Tape Dental Injury: Teeth and Oropharynx as per pre-operative assessment

## 2023-05-03 NOTE — Progress Notes (Signed)
Patient ID: Brittany Mullins, female   DOB: 08-Feb-1981, 43 y.o.   MRN: [id number] Met with the patient very drowsy post MRI; to review current medical situation, rehab schedule, team conference and plan of care. Discussed secondary risk management including HTN and DAPT x 3 weeks then ASA solo with Lipitor and BP medications. Reviewed HH diet modification recommendations.  Reviewed housing issues; lives on 3rd floor apt and pain management. Continue to follow along to address educational needs to facilitate preparation for discharge. Pamelia Hoit

## 2023-05-03 NOTE — Progress Notes (Signed)
Physical Therapy Session Note  Patient Details  Name: Brittany Mullins MRN: [id number] Date of Birth: 23-May-1980  Today's Date: 05/03/2023  Short Term Goals: Week 1:  PT Short Term Goal 1 (Week 1): Pt wil perform bed mobilty from flat surface with min assist to the L to simulate home environment PT Short Term Goal 2 (Week 1): Pt will perform transfers with min assist PT Short Term Goal 3 (Week 1): Pt will perform 4 steps with min assist PT Short Term Goal 4 (Week 1): Pt will be able to gait x 50' with min assist  Pt missed 60 min of skilled therapy due to pt asleep following anaesthesia with inability to stay aroused. Will re-attempt as schedule and pt availability permits.   Therapy Documentation Precautions:  Precautions Precautions: Fall Restrictions Weight Bearing Restrictions Per Provider Order: No   Therapy/Group: Individual Therapy  Judas Mohammad PTA 05/03/2023, 4:07 PM

## 2023-05-03 NOTE — Progress Notes (Signed)
Occupational Therapy Note  Patient Details  Name: Brittany Mullins MRN: [id number] Date of Birth: 03-22-1981  Today's Date: 05/03/2023 OT Missed Time: 45 Minutes Missed Time Reason: Patient fatigue  Pt had returned from a procedure in which she had received anesthesia and was sound asleep.  Checked on pt at 1115 and 1135 during scheduled treatment time and pt still sound asleep.  Chose not to try to wake her as pt needed to rest.      Brittany Mullins 05/03/2023, 12:17 PM

## 2023-05-03 NOTE — Progress Notes (Signed)
Reached out to Dr. Jake Samples as Dr. Jodean Lima expressed concerns of unstable neck on prelim MRI C spine that could be cause of symptoms and no reports of fall or trauma to explain onset of LUE symptoms. Dr. Jake Samples has reviewed images and reported that it shows degenerative changes and no significant stenosis. Does not need any intervention and to treated symptomatically.

## 2023-05-03 NOTE — Progress Notes (Signed)
Inpatient Rehabilitation Care Coordinator Assessment and Plan Patient Details  Name: Brittany Mullins MRN: [id number] Date of Birth: 1980/04/18  Today's Date: 05/03/2023  Hospital Problems: Principal Problem:   Left pontine stroke [facility])  Past Medical History:  Past Medical History:  Diagnosis Date   Addison's disease (HCC)    Brain tumor (benign) (HCC)    Chronic pancreatitis (HCC)    Hearing loss    Hypertension    Marijuana use    Per pt: "medical marijuana patient"   Pancreatitis    PCOS (polycystic ovarian syndrome)    Pituitary tumor    Seizures (HCC)    Past Surgical History:  Past Surgical History:  Procedure Laterality Date   ABDOMINAL HYSTERECTOMY     APPENDECTOMY     BIOPSY  08/16/2021   Procedure: BIOPSY;  Surgeon: Shellia Cleverly, DO;  Location: [facility];  Service: Gastroenterology;;   CESAREAN SECTION     CHOLECYSTECTOMY     ELBOW SURGERY     ESOPHAGOGASTRODUODENOSCOPY (EGD) WITH PROPOFOL N/A 08/16/2021   Procedure: ESOPHAGOGASTRODUODENOSCOPY (EGD) WITH PROPOFOL;  Surgeon: Shellia Cleverly, DO;  Location: [facility] ENDOSCOPY;  Service: Gastroenterology;  Laterality: N/A;   kidney stent     pancreatic stent     RADIOLOGY WITH ANESTHESIA N/A 04/25/2023   Procedure: MRI of Brain without contrast;  Surgeon: Radiologist, Medication, MD;  Location: [facility] OR;  Service: Radiology;  Laterality: N/A;   Social History:  reports that she has never smoked. She has never used smokeless tobacco. She reports that she does not currently use drugs after having used the following drugs: Marijuana. She reports that she does not drink alcohol.  Family / Support Systems Marital Status: Divorced How Long?: 2007 Patient Roles: Parent, Partner Spouse/Significant Other: Therapist, nutritional (s/o) Children: two adult sons- Brittany Mullins (lives in [place] with his wife [place]es[place]), and Brittany Mullins (lives 2hrs away with his wife Otelia Santee) Other Supports: Pt s/o is working on paying some privately to stay with  pt while he is at work Anticipated Caregiver: Therapist, nutritional Ability/Limitations of Caregiver: Pt s/o Therapist, nutritional works 630am-4pm Medical laboratory scientific officer: 24/7 Family Dynamics: Pt lives with s/o Therapist, nutritional  Social History Preferred language: English Religion: None Cultural Background: Pt worked for [employer] at [employer] until she had to stop working due to medical reasons in Oct 2024 Education: some college Primary school teacher - How often do you need to have someone help you when you read instructions, pamphlets, or other written material from your doctor or pharmacy?: Never Writes: Yes Employment Status: Disabled Marine scientist Issues: Denies Guardian/Conservator: Denies   Abuse/Neglect Abuse/Neglect Assessment Can Be Completed: Yes Physical Abuse: Denies Verbal Abuse: Denies Sexual Abuse: Denies Exploitation of patient/patient's resources: Denies Self-Neglect: Denies  Patient response to: Social Isolation - How often do you feel lonely or isolated from those around you?: Never  Emotional Status Pt's affect, behavior and adjustment status: pt in good spirits at time of viist Recent Psychosocial Issues: Pt concerned about her current limitations Psychiatric History: Pt admits to hx of bipolar d/o and does not take medication as it has caused her to gain weight. She realizes she needs to resume meds Substance Abuse History: Denies  Patient / Family Perceptions, Expectations & Goals Pt/Family understanding of illness & functional limitations: Pt and family have a general understanding of pt care needs Premorbid pt/family roles/activities: Independent Anticipated changes in roles/activities/participation: Assistance with ADLs/IADLs Pt/family expectations/goals: Pt goal is to work on hands, Right side, learn to roll over, and get mouth to work  with my brain  [employer]: None Premorbid Home Care/DME Agencies: None Transportation available at discharge:  TBD Is the patient able to respond to transportation needs?: Yes In the past 12 months, has lack of transportation kept you from medical appointments or from getting medications?: No In the past 12 months, has lack of transportation kept you from meetings, work, or from getting things needed for daily living?: No Resource referrals recommended: Neuropsychology  Discharge Planning Living Arrangements: Spouse/significant other Support Systems: Spouse/significant other, Friends/neighbors Type of Residence: Private residence Insurance Resources: Electrical engineer Resources: [employer] Financial Screen Referred: No Living Expenses: Psychologist, sport and exercise Management: Patient, Spouse Does the patient have any problems obtaining your medications?: No Home Management: Both help manahge home care needs Patient/Family Preliminary Plans: TBD Care Coordinator Barriers to Discharge: Decreased caregiver support, Lack of/limited family support, Insurance for SNF coverage Care Coordinator Anticipated Follow Up Needs: HH/OP  Clinical Impression SW met with pt at bedside to introduce self, explain role, and discuss discharge process. Pt is not a Cytogeneticist. No HCPOA. No DME.  Shaila Gilchrest A Allysia Ingles 05/03/2023, 12:40 PM

## 2023-05-03 NOTE — Transfer of Care (Signed)
Immediate Anesthesia Transfer of Care Note  Patient: Brittany Mullins  Procedure(s) Performed: MRI WITH ANESTHESIA CERVICAL SPINE WITHOUT CONTRAST  Patient Location: PACU  Anesthesia Type:General  Level of Consciousness: drowsy  Airway & Oxygen Therapy: Patient Spontanous Breathing and Patient connected to face mask oxygen  Post-op Assessment: Report given to RN and Post -op Vital signs reviewed and stable  Post vital signs: Reviewed and stable  Last Vitals:  Vitals Value Taken Time  BP    Temp    Pulse    Resp    SpO2      Last Pain:  Vitals:   05/03/23 0718  TempSrc: Oral  PainSc:       Patients Stated Pain Goal: 2 (05/03/23 0000)  Complications: No notable events documented.

## 2023-05-03 NOTE — Progress Notes (Signed)
Briefly dicussed results and recommendations by NS. She is willing to try steroid dose pack. Psychiatry came in to evaluate patient but she is still too sedated from procedure. Recommended follow up tomorrow and new order placed for follow up tomorrow.
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
## 2023-05-03 NOTE — Care Management (Signed)
Inpatient Rehabilitation Center Individual Statement of Services  Patient Name:  Lenell Mcconnell [id number]  Date:  05/03/2023  Welcome to the Inpatient Rehabilitation Center.  Our goal is to provide you with an individualized program based on your diagnosis and situation, designed to meet your specific needs.  With this comprehensive rehabilitation program, you will be expected to participate in at least 3 hours of rehabilitation therapies Monday-Friday, with modified therapy programming on the weekends.  Your rehabilitation program will include the following services:  Physical Therapy (PT), Occupational Therapy (OT), Speech Therapy (ST), 24 hour per day rehabilitation nursing, Therapeutic Recreaction (TR), Psychology, Neuropsychology, Care Coordinator, Rehabilitation Medicine, Nutrition Services, Pharmacy Services, and Other  Weekly team conferences will be held on Wednesdays to discuss your progress.  Your Inpatient Rehabilitation Care Coordinator will talk with you frequently to get your input and to update you on team discussions.  Team conferences with you and your family in attendance may also be held.  Expected length of stay: 14-18 days    Overall anticipated outcome: Contact Guard/Supervision  Depending on your progress and recovery, your program may change. Your Inpatient Rehabilitation Care Coordinator will coordinate services and will keep you informed of any changes. Your Inpatient Rehabilitation Care Coordinator's name and contact numbers are listed  below.  The following services may also be recommended but are not provided by the Inpatient Rehabilitation Center:  Driving Evaluations Home Health Rehabiltiation Services Outpatient Rehabilitation Services Vocational Rehabilitation   Arrangements will be made to provide these services after discharge if needed.  Arrangements include referral to agencies that provide these services.  Your insurance has been verified to be:   Medicare A/B  Your primary doctor is:  No PCP listed  Pertinent information will be shared with your doctor and your insurance company.  Inpatient Rehabilitation Care Coordinator:  Susie Cassette [phone] or [phone]  Information discussed with and copy given to patient by: Gretchen Short, 05/03/2023, 9:11 AM

## 2023-05-03 NOTE — Progress Notes (Signed)
Patient ID: Brittany Mullins, female   DOB: Aug 12, 1980, 43 y.o.   MRN: [id number]  1012- SW spoke with pt s/o [name] to inform on ELOS. SW will provide updates after team conference.   Cecile Sheerer, MSW, LCSW Office: [phone] Cell: [phone] Fax: [phone]

## 2023-05-03 NOTE — Progress Notes (Signed)
Speech Language Pathology Daily Session Note  Patient Details  Name: Brittany Mullins MRN: [id number] Date of Birth: [birth year]  Today's Date: 05/03/2023 SLP Individual Time: 7829-5621 SLP Individual Time Calculation (min): 58 min  Short Term Goals: Week 1: SLP Short Term Goal 1 (Week 1): Patient will participate in a MBS to assess oropharyngeal swallowing function SLP Short Term Goal 2 (Week 1): Patient will increase speech intelligibility to 100% at the sentence level given min multimodal A SLP Short Term Goal 3 (Week 1): Patient will demonstrate problem solving abilities in mildly complex daily situations given min multimodal A SLP Short Term Goal 4 (Week 1): Patient will recall and utilize memory strategies given min multimodal A SLP Short Term Goal 5 (Week 1): Patient will sustain attention to tasks for 10 minutes at a time with min multimodal A  Skilled Therapeutic Interventions: Skilled therapy session focused on cognitive goals. SLP faciliated session by providing supervision A during medication management task. SLP provided written instructions for patient to place "medications" in four times a day pill box. Patient required supervision A to maintain attention to task and utilize organizational skills. SLP targeted memory goals through education regarding WRAP (write, repeat, associate, picture) memory strategies. Patient provided examples of each with supervision A. Patient continues to report occasional choking episodes during meals and independently recalled strategies taught on evaluation. MBS scheduled for Monday. Patient left in [facility] with alarm set and call bell in reach. Continue POC.  Pain None reported   Therapy/Group: Individual Therapy  Forrest Jaroszewski M.A., CF-SLP 05/03/2023, 7:41 AM

## 2023-05-03 NOTE — Anesthesia Postprocedure Evaluation (Signed)
Anesthesia Post Note  Patient: Brittany Mullins  Procedure(s) Performed: MRI WITH ANESTHESIA CERVICAL SPINE WITHOUT CONTRAST     Patient location during evaluation: PACU Anesthesia Type: General Level of consciousness: awake and alert Pain management: pain level controlled Vital Signs Assessment: post-procedure vital signs reviewed and stable Respiratory status: spontaneous breathing, nonlabored ventilation, respiratory function stable and patient connected to nasal cannula oxygen Cardiovascular status: blood pressure returned to baseline and stable Postop Assessment: no apparent nausea or vomiting Anesthetic complications: no   No notable events documented.  Last Vitals:  Vitals:   05/03/23 0930 05/03/23 0955  BP: (!) 103/57 99/69  Pulse: 69 72  Resp: 14 15  Temp: 36.9 C (!) 36.4 C  SpO2: 95% 97%    Last Pain:  Vitals:   05/03/23 0955  TempSrc: Oral  PainSc:                  [name]

## 2023-05-03 NOTE — IPOC Note (Signed)
Overall Plan of Care [facility]) Patient Details Name: Brittany Mullins MRN: [id number] DOB: 1980/06/08  Admitting Diagnosis: Left pontine stroke [facility])  Hospital Problems: Principal Problem:   Left pontine stroke [facility])     Functional Problem List: Nursing Pain, Bowel, Safety, Endurance, Medication Management  PT Balance, Behavior, Endurance, Motor, Pain, Perception, Safety, Sensory, Skin Integrity  OT Balance, Behavior, Cognition, Endurance, Motor, Pain, Perception, Safety, Sensory, Vision  SLP Cognition, Nutrition  TR         Basic ADL's: OT Eating, Grooming, Bathing, Dressing, Toileting     Advanced  ADL's: OT       Transfers: PT Bed to Chair, Bed Mobility, Car, Occupational psychologist, Research scientist (life sciences): PT Ambulation, Psychologist, prison and probation services, Stairs     Additional Impairments: OT Fuctional Use of Upper Extremity  SLP Swallowing, Communication, Social Cognition expression Problem Solving, Memory, Attention  TR      Anticipated Outcomes Item Anticipated Outcome  Self Feeding independent  Swallowing  supervisionA   Basic self-care  set up UB self care, CGA LB self care  Toileting  CGA   Bathroom Transfers CGA  Bowel/Bladder  manage bowel w mod I assist  Transfers  supervision/min assist  Locomotion  CGA/min assist gait and stairs  Communication  minA  Cognition  minA  Pain  < 4 with prns  Safety/Judgment  manage w cues   Therapy Plan: PT Intensity: Minimum of 1-2 x/day ,45 to 90 minutes PT Frequency: 5 out of 7 days PT Duration Estimated Length of Stay: 14-18 days OT Intensity: Minimum of 1-2 x/day, 45 to 90 minutes OT Frequency: 5 out of 7 days OT Duration/Estimated Length of Stay: 14-16 days SLP Intensity: Minumum of 1-2 x/day, 30 to 90 minutes SLP Frequency: 3 to 5 out of 7 days SLP Duration/Estimated Length of Stay: 10-14 days   Team Interventions: Nursing Interventions Pain Management, Bowel Management, Medication Management,  Discharge Planning, Disease Management/Prevention, Patient/Family Education  PT interventions Ambulation/gait training, Warden/ranger, Cognitive remediation/compensation, Community reintegration, Discharge planning, Disease management/prevention, DME/adaptive equipment instruction, Functional mobility training, Neuromuscular re-education, Pain management, Patient/family education, Psychosocial support, Skin care/wound management, Splinting/orthotics, Stair training, Therapeutic Activities, Therapeutic Exercise, UE/LE Strength taining/ROM, UE/LE Coordination activities, Visual/perceptual remediation/compensation, Wheelchair propulsion/positioning  OT Interventions Warden/ranger, Discharge planning, Cognitive remediation/compensation, DME/adaptive equipment instruction, Disease mangement/prevention, Functional mobility training, Neuromuscular re-education, Psychosocial support, Patient/family education, Pain management, Self Care/advanced ADL retraining, UE/LE Strength taining/ROM, Therapeutic Exercise, Therapeutic Activities, UE/LE Coordination activities, Visual/perceptual remediation/compensation  SLP Interventions Cognitive remediation/compensation, Dysphagia/aspiration precaution training, Internal/external aids, Speech/Language facilitation, Cueing hierarchy, Therapeutic Activities, Functional tasks, Patient/family education  TR Interventions    SW/CM Interventions Discharge Planning, Psychosocial Support, Patient/Family Education   Barriers to Discharge MD  Medical stability and pain  Nursing Home environment access/layout, Decreased caregiver support 3rd floor apartment w  S.O.Durene Cal ,who works 630 until 4, dtr in Social worker works as Child psychotherapist 3 days per week; hired caregiver likely good be ther 2 hrs per day when noone in family there. Longest span likely 2 hrs without caregiver  PT Decreased caregiver support, Home environment access/layout, Medication compliance, Behavior has  3 flights of stairs to access apt (working on plan to move to first floor)  OT Inaccessible home environment, Decreased caregiver support apt on 3rd floor, significant other works during the day  SLP      SW Decreased caregiver support, Lack of/limited family support, Community education officer for SNF coverage     Team Discharge Planning: Destination: PT-Home ,OT- Home ,  SLP-Home Projected Follow-up: PT-Home health PT, 24 hour supervision/assistance, OT-  Home health OT, SLP-Home Health SLP, Outpatient SLP Projected Equipment Needs: PT-Wheelchair (measurements), Wheelchair cushion (measurements), Rolling walker with 5" wheels, OT- Tub/shower bench, SLP-None recommended by SLP Equipment Details: PT- , OT-  Patient/family involved in discharge planning: PT- Patient,  OT-Patient, SLP-Patient  MD ELOS: 14-18d Medical Rehab Prognosis:  Good Assessment: The patient has been admitted for CIR therapies with the diagnosis of left pontine infarct. The team will be addressing functional mobility, strength, stamina, balance, safety, adaptive techniques and equipment, self-care, bowel and bladder mgt, patient and caregiver education, cervical spondylolisthesis. Goals have been set at min A. Anticipated discharge destination is home with caregiver support.        See Team Conference Notes for weekly updates to the plan of care

## 2023-05-04 ENCOUNTER — Inpatient Hospital Stay (HOSPITAL_COMMUNITY): Payer: Medicare Other

## 2023-05-04 DIAGNOSIS — M25512 Pain in left shoulder: Secondary | ICD-10-CM | POA: Diagnosis not present

## 2023-05-04 DIAGNOSIS — G5621 Lesion of ulnar nerve, right upper limb: Secondary | ICD-10-CM | POA: Diagnosis not present

## 2023-05-04 DIAGNOSIS — I639 Cerebral infarction, unspecified: Secondary | ICD-10-CM | POA: Diagnosis not present

## 2023-05-04 DIAGNOSIS — F1129 Opioid dependence with unspecified opioid-induced disorder: Secondary | ICD-10-CM | POA: Diagnosis not present

## 2023-05-04 MED ORDER — CAPSAICIN 0.025 % EX CREA
TOPICAL_CREAM | Freq: Two times a day (BID) | CUTANEOUS | Status: DC
  Administered 2023-05-06: 1 via TOPICAL
  Filled 2023-05-04: qty 60

## 2023-05-04 MED ORDER — TRAZODONE HCL 50 MG PO TABS
25.0000 mg | ORAL_TABLET | Freq: Every evening | ORAL | Status: DC | PRN
  Administered 2023-05-04: 25 mg via ORAL
  Filled 2023-05-04: qty 1
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
## 2023-05-04 NOTE — Progress Notes (Addendum)
Physical Therapy Session Note  Patient Details  Name: Brittany Mullins MRN: [id number] Date of Birth: 1981/02/06  Today's Date: 05/04/2023 PT Individual Time: 1310-1358 PT Individual Time Calculation (min): 48 min   Short Term Goals: Week 1:  PT Short Term Goal 1 (Week 1): Pt wil perform bed mobilty from flat surface with min assist to the L to simulate home environment PT Short Term Goal 2 (Week 1): Pt will perform transfers with min assist PT Short Term Goal 3 (Week 1): Pt will perform 4 steps with min assist PT Short Term Goal 4 (Week 1): Pt will be able to gait x 50' with min assist  Skilled Therapeutic Interventions/Progress Updates: Patient sitting in recliner with boyfriend and nsg present on entrance to room. Patient alert and agreeable to PT session.   Beginning of session focused on building pt rapport. Patient reported unrated pain in L shoulder (unrated) with nsg providing medication. Pt also reported unrated headache with tension from L shoulder up to back of head. PTA palpated upper trap musculature with tension and knots noted. Pt educated that since L UE at shoulder has had decreased ROM due to previous dislocation, that it pushes other muscles to pick up the slack which can create tightness and knots. Initiated education on trigger point release with pt wanting to participate. PTA lightly provided pressure to points but pt unable to tolerate due to referred pain to R jaw and R lateral neck musculature.   After performing TUG, pt wanted to work on L UE tightness and trigger point release. PTA provided hot pad to stated area to loosen up musculature (6 minutes with pillow case underneath) as to decrease pain experienced during manual therapy.   After manual therapy performed in main gym, pt began to lightly move in forward and backward motion while sitting in WC with eyes starting to close and B UE moving into flexed position (stiffened). Pt reported "I think I am having a  seizure." Pt immediately transported back to room with attending MD present at nsg station and monitored pt's presentation throughout beginning duration. Pt minA transfer to EOB from [facility] and transitioned to semi-R sidelying with HOB elevated. MD still present (timing pt's episode). Pt BP and HR assessed after 5 minutes of seizure onset (recorded below). Pt in nsg care with episode decreasing and able to respond to questions (oriented to name, DOB, and reason for hospital admission - asked by nsg).   Manual Therapy: Palpation of L upper trap musculature performed with trigger points noted. Education and rationale provided with pt agreeing to participate in intervention.  - Trigger point release to stated area with soft tissue mobilization to follow throughout. Increased time required to adjust to pt's tolerance to trigger point release with pt continued reports of referred pain to R jaw and lateral neck. Further manual therapy deferred per pt request due to decreased pain tolerance.   Patient supine in bed at end of session with with all 4 rails up and pads donned, brakes locked, bed alarm set, nsg present and all needs within reach.      Therapy Documentation Precautions:  Precautions Precautions: Fall Restrictions Weight Bearing Restrictions Per Provider Order: No   Therapy/Group: Individual Therapy  Mazey Mantell PTA 05/04/2023, 3:33 PM

## 2023-05-04 NOTE — Progress Notes (Signed)
Patient family brought patients methadone from at home including the empty bottles to store. Nurse counted medication with patient, signed pharmacy paper and nurse took medication to pharmacy and check it in at 1942. Patient educated that we are holding medication until discharge per patients request. Boyfriend Durene Cal morris brought medication.

## 2023-05-04 NOTE — Progress Notes (Signed)
 EEG complete - results pending

## 2023-05-04 NOTE — Plan of Care (Signed)
  Problem: Consults Goal: RH STROKE PATIENT EDUCATION Description: See Patient Education module for education specifics  Outcome: Progressing   Problem: RH BOWEL ELIMINATION Goal: RH STG MANAGE BOWEL WITH ASSISTANCE Description: STG Manage Bowel with mod I Assistance. Outcome: Progressing Goal: RH STG MANAGE BOWEL W/MEDICATION W/ASSISTANCE Description: STG Manage Bowel with Medication with mod I Assistance. Outcome: Progressing   Problem: RH SAFETY Goal: RH STG ADHERE TO SAFETY PRECAUTIONS W/ASSISTANCE/DEVICE Description: STG Adhere to Safety Precautions With cues Assistance/Device. Outcome: Progressing   Problem: RH PAIN MANAGEMENT Goal: RH STG PAIN MANAGED AT OR BELOW PT'S PAIN GOAL Description: < 4 with prns Outcome: Progressing   Problem: RH KNOWLEDGE DEFICIT Goal: RH STG INCREASE KNOWLEDGE OF HYPERTENSION Description: Patient and S.O. will be able to manage HTN using educational resources for medications and dietary modifications independently Outcome: Progressing Goal: RH STG INCREASE KNOWLEGDE OF HYPERLIPIDEMIA Description: Patient and S.O. will be able to manage HLD using educational resources for medications and dietary modifications independently Outcome: Progressing Goal: RH STG INCREASE KNOWLEDGE OF STROKE PROPHYLAXIS Description: Patient and S.O. will be able to manage secondary risks using educational resources for medications and dietary modifications independently Outcome: Progressing

## 2023-05-04 NOTE — Progress Notes (Signed)
PROGRESS NOTE   Subjective/Complaints:  Discussed MRI C spine result with pt   ROS- neg CP, SOB, N/V/D  Objective:   MR CERVICAL SPINE WO CONTRAST Result Date: 05/03/2023 CLINICAL DATA:  Cervical radiculopathy, no red flags. EXAM: MRI CERVICAL SPINE WITHOUT CONTRAST TECHNIQUE: Multiplanar, multisequence MR imaging of the cervical spine was performed. No intravenous contrast was administered. COMPARISON:  CT cervical spine December 21, 2017. FINDINGS: Alignment: Cervical kyphosis centered at C5, secondary to a 3 mm anterolisthesis of C4 over C5 and trace anterolisthesis of C3 over C4, progressed compared to prior CT. Vertebrae: Diffuse decrease of the T1 signal of the visualized osseous structures. No focal marrow lesion, fracture or evidence of discitis. Cord: Normal signal and morphology. Posterior Fossa, vertebral arteries, paraspinal tissues: T2 hyperintense signal on the left side of the pons corresponding to pontine stroke seen on prior MRI of the brain. Enlarged, partial empty sella. Two mildly prominent left medial supraclavicular lymph nodes appear similar two CT performed in 2019. Disc levels: Perineural cysts are seen bilaterally from C4-5 through C7-T1, more prominent on the right side. C2-3: No spinal canal or neural foraminal stenosis. C3-4: Minimal disc bulge, mild to moderate facet degenerative changes and ligamentum flavum redundancy resulting in minimal bilateral neural foraminal narrowing. No spinal canal stenosis. C4-5: Anterolisthesis and minimal disc bulge resulting in mild spinal canal stenosis. Mild to moderate right and advanced left facet degenerative changes resulting in mild left neural foraminal narrowing. C5-6: Posterior disc osteophyte complex asymmetric to the right causes mild mass effect on the right anterior cord surface without cord signal abnormality. Uncovertebral and facet degenerative changes contribute for  mild right neural foraminal narrowing. C6-7: Small posterior disc osteophyte complex, uncovertebral and facet degenerative changes without significant spinal canal or neural foraminal stenosis. C7-T1: No spinal canal neural foraminal stenosis. IMPRESSION: 1. Anterolisthesis of C4 over C5, progressed since prior CT, facet mediated. 2. Degenerative changes of the cervical spine with mild spinal canal stenosis at C4-5 and C5-6. 3. Mild neural foraminal narrowing on the left at C4-5 and on the right at C5-6. 4. Diffuse decrease of the T1 signal of the visualized osseous structures, which may be seen in the setting of anemia or other marrow replacing processes. Electronically Signed   By: Baldemar Lenis M.D.   On: 05/03/2023 10:13   Recent Labs    05/02/23 0824  WBC 7.5  HGB 11.7*  HCT 35.4*  PLT 209   Recent Labs    05/02/23 0824  NA 139  K 3.4*  CL 106  CO2 25  GLUCOSE 122*  BUN 21*  CREATININE 0.87  CALCIUM 8.4*    Intake/Output Summary (Last 24 hours) at 05/04/2023 1006 Last data filed at 05/04/2023 0945 Gross per 24 hour  Intake 1020 ml  Output --  Net 1020 ml        Physical Exam: Vital Signs Blood pressure 127/70, pulse 68, temperature 98.5 F (36.9 C), resp. rate 16, height 5\' 2"  (1.575 m), weight 70.3 kg, SpO2 96%.   General: No acute distress Mood and affect are appropriate Heart: Regular rate and rhythm no rubs murmurs or extra sounds Lungs: Clear to auscultation,  breathing unlabored, no rales or wheezes Abdomen: Positive bowel sounds, soft nontender to palpation, nondistended Extremities: No clubbing, cyanosis, or edema Skin: No evidence of breakdown, no evidence of rash Neurologic: Cranial nerves II through XII intact, motor strength is 3- /5 in bilateral deltoid, bicep, tricep,RIght  grip, 5/5 Left grip, 5/5 left and 4/5 right hip flexor, knee extensors, ankle dorsiflexor and plantar flexor Some pain inhibition LUE  Sensory examreduced LT sensation  RIght 4th and 5th digit, left shoulder  Cerebellar exam limited by strength on right and pain on left  Musculoskeletal: Full range of motion in all 4 extremities. No joint swelling   Assessment/Plan: 1. Functional deficits which require 3+ hours per day of interdisciplinary therapy in a comprehensive inpatient rehab setting. Physiatrist is providing close team supervision and 24 hour management of active medical problems listed below. Physiatrist and rehab team continue to assess barriers to discharge/monitor patient progress toward functional and medical goals  Care Tool:  Bathing    Body parts bathed by patient: Abdomen, Chest, Front perineal area, Right upper leg, Left upper leg, Left lower leg, Face, Left arm, Right arm   Body parts bathed by helper: Right lower leg, Buttocks, Right arm, Left arm     Bathing assist Assist Level: Minimal Assistance - Patient > 75%     Upper Body Dressing/Undressing Upper body dressing   What is the patient wearing?: Bra, Pull over shirt    Upper body assist Assist Level: Moderate Assistance - Patient 50 - 74%    Lower Body Dressing/Undressing Lower body dressing      What is the patient wearing?: Underwear/pull up, Pants     Lower body assist Assist for lower body dressing: Moderate Assistance - Patient 50 - 74%     Toileting Toileting    Toileting assist Assist for toileting: Maximal Assistance - Patient 25 - 49%     Transfers Chair/bed transfer  Transfers assist     Chair/bed transfer assist level: Moderate Assistance - Patient 50 - 74%     Locomotion Ambulation   Ambulation assist      Assist level: Moderate Assistance - Patient 50 - 74% Assistive device: No Device Max distance: 10   Walk 10 feet activity   Assist     Assist level: Moderate Assistance - Patient - 50 - 74% Assistive device: No Device   Walk 50 feet activity   Assist Walk 50 feet with 2 turns activity did not occur: Safety/medical  concerns         Walk 150 feet activity   Assist Walk 150 feet activity did not occur: Safety/medical concerns         Walk 10 feet on uneven surface  activity   Assist Walk 10 feet on uneven surfaces activity did not occur: Safety/medical concerns         Wheelchair     Assist Is the patient using a wheelchair?: Yes Type of Wheelchair: Manual      Max wheelchair distance: 50    Wheelchair 50 feet with 2 turns activity    Assist        Assist Level: Minimal Assistance - Patient > 75%   Wheelchair 150 feet activity     Assist      Assist Level: Maximal Assistance - Patient 25 - 49%   Blood pressure 127/70, pulse 68, temperature 98.5 F (36.9 C), resp. rate 16, height 5\' 2"  (1.575 m), weight 70.3 kg, SpO2 96%.  Medical Problem List and Plan:  1. Functional deficits secondary to Lft ventral pontine CVA, likely small vessel disease             -patient may shower             -ELOS/Goals: 10-14 days,  Mod I to Sup PT/OT/SLP             -Admit to CIR 2.  Antithrombotics: -DVT/anticoagulation:  Pharmaceutical: Lovenox             -antiplatelet therapy: DAPT X 3 weeks followed by ASA alone.  3. Pain Management: On Methadone (followed by [facility]).             --now on prn oxycodone due to shoulder/arm pain             --gabapentin TID for neuropathy 4. H/o bipolar d/o/Mood/Behavior/Sleep: LCSW to follow for evaluation and support.              --Trazodone prn for insomnia              -antipsychotic agents: N/A--has not been on any psych meds for years.  5. Neuropsych/cognition: This patient is capable of making decisions on her own behalf. 6. Skin/Wound Care: Routine pressure relief measures. 7. Fluids/Electrolytes/Nutrition: Monitor I/O. Mild hypoK may worsen with prednisone will supplement KCL daily  8. HTN: Monitor BP TID--continue Cozaar and Norvasc. Long term goal normotensive 9. Seizure d/o: Now on Keppra TID 10. Addison's  disease: Now on Cortef 10 mg am/5 mg pm 11. Chronic pancreatitis: Has chronic issues with nausea --continue phenergan --continue PPI 13. Constipation: Reports rectal bleeding-->colace added to senna due to hx of hems.   14. Left arm shoulder/elbow pain             -Xray elbow/shoulder 1/28 negative Suspect myofascial pain +/- frozen shoulder  15. Marijuana use             -Advise cessation, associated with increased stroke risk 26-44% depending on study  16. Hx of pituitary tumor             -f/u Neurosurgery 17. Low protein S             -f/u Hematology outpatient, will need retesting in ~18mo  18.  Hx sensorineural hearing loss with resultant dysarthria  19.  LUE numbness C5 distribution shoulder pain , ? Proximal weakness vs pain inhibition , only mild C4-5 stenosis on MRI  , MRI reviewed with Neuroradiology , no spinal cord signal abnormalities Neurosurg, Dr Dawley looked at MRI , C spine looks stable  Still has subjective numbess in left shoulder  LOS: 3 days A FACE TO FACE EVALUATION WAS PERFORMED  Erick Colace 05/04/2023, 10:06 AM

## 2023-05-04 NOTE — Progress Notes (Signed)
Physical Therapy Session Note  Patient Details  Name: Brittany Mullins MRN: [id number] Date of Birth: 1980/11/30  Today's Date: 05/04/2023 PT Individual Time: 0805-0915 PT Individual Time Calculation (min): 70 min   Short Term Goals: Week 1:  PT Short Term Goal 1 (Week 1): Pt wil perform bed mobilty from flat surface with min assist to the L to simulate home environment PT Short Term Goal 2 (Week 1): Pt will perform transfers with min assist PT Short Term Goal 3 (Week 1): Pt will perform 4 steps with min assist PT Short Term Goal 4 (Week 1): Pt will be able to gait x 50' with min assist  Skilled Therapeutic Interventions/Progress Updates:  Patient supine in bed and asleep on entrance to room. Boyfriend present and sleeping in recliner next to pt. Patient immediately relates pain in posterior neck and L shoulder upon being roused. Relates having asked for pain meds but has not yet seen RN.   After a minute, pt remembers that she has had pain medication already this morning. Double checked with RN re: time of pain medication provided and time of next pain medication. Related info to pt.   Pt more amenable on return to room and is alert and agreeable to PT session. Does not relate further pain in neck or shoulder during session.   Therapeutic Activity: Bed Mobility: Pt initiated roll to L side and then attempts to initiate push into upright seated position but is unable. Provided vc/ tc for push with R hand as well as L elbow to push upright ans is able to complete with Mod A. Sits EOB with single or BUE on bed surface and supervision.   Requires MaxA to don sneakers. Does not want to further dress until shower performed. OT notified re: pt's wish for shower during session at 10am.   At end of session, pt is able to return to supine with supervision.   Transfers: Pt performed sit<>stand from EOB with CGA/ MinA to no AD. Pt holds to therapist's arms. Initially reporting  lightheadedness that dissipates quickly with time in stance. Stand pivot transfers perofrmed with MinA and no AD or with CGA/ MinA and use of RW.  Brought to main therapy gym and fitted for L hand saddle splint on RW. Although she has good grasp strength with L hand, arm is weak and requires consistent cues as well as maintaining L hand on RW in order to progress walker without physical assist.   Changed out w/c for lower height in order for pt to better reach feet to floor for assist with propel.   Gait Training:  Once fitted for L hand saddle splint to RW, pt able to ambulate 40' x1/ 110' x1 with overall CGA. Demonstrated improved ability to advance RW without physical assist. Guard provided to pt's L knee > R knee for potential buckling. No buckling noted throughout.  Also demos increased forward flexion with pressure into BUEs on RW.  Neuromuscular Re-ed: NMR facilitated during session with focus on standing balance, motor control. Pt guided in sit<>stands with no AD and with blocked practice for improved technique and to reach back for seat prior to descent to sit. Pt able to improve from MinA to close supervision/ CGA.  NMR performed for improvements in motor control and coordination, balance, sequencing, judgement, and self confidence/ efficacy in performing all aspects of mobility at highest level of independence.   Patient supine in bed at end of session with brakes locked, bed alarm set,  and all needs within reach.    Therapy Documentation Precautions:  Precautions Precautions: Fall Restrictions Weight Bearing Restrictions Per Provider Order: No  Pain: Pt relates pain in neck and L shoulder on entrance to room. Immediately asks for pain medication. Then remembers that she has already had her pain medication. Does not relate pain again throughout session.   Therapy/Group: Individual Therapy  Loel Dubonnet PT, DPT, CSRS 05/04/2023, 3:28 PM

## 2023-05-04 NOTE — Progress Notes (Addendum)
Saw pt in hallway with PT. patient stated I think I am going to have a seizure.  The patient was wheeled into her room transferred with min assist into her bed and then she started breathing shallow breaths, mild tachypnea, eyes closed but she would open her eyes to command at least on 2 occasions.  Her left arm had elbow flexion extension, short arc approximately 3 to 5 Hz.  No incontinence.  The episode lasted 3 minutes.  She was able to follow commands afterward.  Noted consistent motor exam versus this morning. Noted prior staring episode in acute care with neg EEG, Keppra dose was increased   Ordered repeat CT head and EEG, seizure prec Notified Neuro, Dr Roda Shutters who concurs with orders

## 2023-05-04 NOTE — Progress Notes (Signed)
Occupational Therapy Session Note  Patient Details  Name: Brittany Mullins MRN: [id number] Date of Birth: 1980-12-14  Today's Date: 05/04/2023 OT Individual Time: 5284-1324 OT Individual Time Calculation (min): 55 min    Short Term Goals: Week 1:  OT Short Term Goal 1 (Week 1): pt will demonstrate improved use of L shoulder to don shirt with min A. OT Short Term Goal 2 (Week 1): Pt will be able to stand with min A to pull pants over hips. OT Short Term Goal 3 (Week 1): pt will be able to safely use RW to ambulate to toilet with mod A.  Skilled Therapeutic Interventions/Progress Updates:  Pt greeted supine in bed, pt initially on the phone crying and upset, allowed pt time to vent and then pt agreeable to OT intervention.      Transfers/bed mobility/functional mobility: pt completed supine>sit to L side with CGA. Pt completed sit>stand from EOB with RW and CGA. Pt completed ambulatory transfer to bathroom with RW and CGA- MINA.   ADLs:  Grooming: pt completed seated hair brushing and oral care with set- up assist however good incorporation of BUEs into ADL tasks.  UB dressing: donned bra with MAX A , pt able to don OH shirt with set- up assist.  LB dressing: donned pants / underwear with CGA for standing balance from w/c  Footwear: donned socks via figure 4 with increased time and set-up assist.   Bathing: pt completed bathing from sitting/standing with MIN A as pt requested assist to wash bottom in standing and anterior periarea.  Transfers: pt completed ambulatory ADL transfers with RW and CGA.  Toileting: pt completed 3/3 toileting tasks with CGA for balance, continent urine void.    Ended session with pt seated in w/c with all needs within reach and safety belt alarm activated.                    Therapy Documentation Precautions:  Precautions Precautions: Fall Restrictions Weight Bearing Restrictions Per Provider Order: No  Pain:   No pain reported during  session    Therapy/Group: Individual Therapy  Barron Schmid 05/04/2023, 12:13 PM

## 2023-05-05 ENCOUNTER — Inpatient Hospital Stay (HOSPITAL_COMMUNITY): Payer: Medicare Other

## 2023-05-05 DIAGNOSIS — F1129 Opioid dependence with unspecified opioid-induced disorder: Secondary | ICD-10-CM | POA: Diagnosis not present

## 2023-05-05 DIAGNOSIS — R569 Unspecified convulsions: Secondary | ICD-10-CM | POA: Diagnosis not present

## 2023-05-05 DIAGNOSIS — F1994 Other psychoactive substance use, unspecified with psychoactive substance-induced mood disorder: Secondary | ICD-10-CM

## 2023-05-05 DIAGNOSIS — F319 Bipolar disorder, unspecified: Secondary | ICD-10-CM

## 2023-05-05 DIAGNOSIS — M25512 Pain in left shoulder: Secondary | ICD-10-CM | POA: Diagnosis not present

## 2023-05-05 DIAGNOSIS — I639 Cerebral infarction, unspecified: Secondary | ICD-10-CM | POA: Diagnosis not present

## 2023-05-05 DIAGNOSIS — G5621 Lesion of ulnar nerve, right upper limb: Secondary | ICD-10-CM | POA: Diagnosis not present

## 2023-05-05 LAB — COMPREHENSIVE METABOLIC PANEL
ALT: 26 U/L (ref 0–44)
AST: 32 U/L (ref 15–41)
Albumin: 3.2 g/dL — ABNORMAL LOW (ref 3.5–5.0)
Alkaline Phosphatase: 49 U/L (ref 38–126)
Anion gap: 8 (ref 5–15)
BUN: 25 mg/dL — ABNORMAL HIGH (ref 6–20)
CO2: 27 mmol/L (ref 22–32)
Calcium: 9.1 mg/dL (ref 8.9–10.3)
Chloride: 104 mmol/L (ref 98–111)
Creatinine, Ser: 0.8 mg/dL (ref 0.44–1.00)
GFR, Estimated: >60 mL/min (ref >60)
Glucose, Bld: 75 mg/dL (ref 70–99)
Potassium: 3.9 mmol/L (ref 3.5–5.1)
Sodium: 139 mmol/L (ref 135–145)
Total Bilirubin: <0.2 mg/dL (ref 0.0–1.2)
Total Protein: 6.4 g/dL — ABNORMAL LOW (ref 6.5–8.1)

## 2023-05-05 LAB — RAPID URINE DRUG SCREEN, HOSP PERFORMED
Amphetamines: NOT DETECTED
Barbiturates: NOT DETECTED
Benzodiazepines: POSITIVE — AB
Cocaine: NOT DETECTED
Opiates: NOT DETECTED
Tetrahydrocannabinol: POSITIVE — AB

## 2023-05-05 LAB — CK: Total CK: 161 U/L (ref 38–234)

## 2023-05-05 MED ORDER — ARIPIPRAZOLE 5 MG PO TABS
5.0000 mg | ORAL_TABLET | Freq: Every day | ORAL | Status: DC
  Administered 2023-05-06 – 2023-05-07 (×2): 5 mg via ORAL
  Filled 2023-05-05 (×3): qty 1

## 2023-05-05 MED ORDER — HYDROXYZINE HCL 10 MG PO TABS
10.0000 mg | ORAL_TABLET | Freq: Three times a day (TID) | ORAL | Status: DC | PRN
  Administered 2023-05-05 – 2023-05-18 (×7): 10 mg via ORAL
  Filled 2023-05-05 (×10): qty 1

## 2023-05-05 MED ORDER — ALPRAZOLAM 0.5 MG PO TABS: 1.0000 mg | ORAL_TABLET | Freq: Once | ORAL | Status: DC

## 2023-05-05 MED ORDER — LAMOTRIGINE 25 MG PO TABS
25.0000 mg | ORAL_TABLET | Freq: Two times a day (BID) | ORAL | Status: DC
  Administered 2023-05-05 – 2023-05-14 (×18): 25 mg via ORAL
  Filled 2023-05-05 (×20): qty 1

## 2023-05-05 MED ORDER — LORAZEPAM 2 MG/ML IJ SOLN
2.0000 mg | Freq: Four times a day (QID) | INTRAMUSCULAR | Status: DC | PRN
  Administered 2023-05-07: 2 mg via INTRAVENOUS
  Filled 2023-05-05: qty 1

## 2023-05-05 MED ORDER — TRAZODONE HCL 50 MG PO TABS
50.0000 mg | ORAL_TABLET | Freq: Every evening | ORAL | Status: DC | PRN
  Administered 2023-05-05 – 2023-05-17 (×9): 50 mg via ORAL
  Filled 2023-05-05 (×9): qty 1

## 2023-05-05 MED ORDER — QUETIAPINE FUMARATE 25 MG PO TABS
25.0000 mg | ORAL_TABLET | Freq: Once | ORAL | Status: AC
  Administered 2023-05-05: 25 mg via ORAL
  Filled 2023-05-05: qty 1

## 2023-05-05 NOTE — Significant Event (Signed)
Rapid Response Event Note   Reason for Call :  Seizure-like activity.  Per RN, pt had approx 3 min of seizure-like activity. After this stopped, pt had another episode of seizure-like activity.   Initial Focused Assessment:  Pt lying in be with eyes closed. She will open her eyes to command, answer simple questions, and move all extremities. She recognizes family in the room. She is lethargic.   T-98.4, HR-83, BP-125/80, RR-20, SpO2-96% on RA  Interventions:  CT head ordered yesterday changed to STAT  Plan of Care:  Seizures are not new. Obtain head CT and await results. Continue to monitor pt. Please call RRT if further assistance needed.   Event Summary:   MD Notified: [place], [place] Call [phone] Arrival [phone] End [id number]  Terrilyn Saver, RN

## 2023-05-05 NOTE — Progress Notes (Addendum)
Trigger point injection   Trigger Point Injection  Indication: Upper trap Myofascial pain not relieved by medication management and other conservative care.  Informed consent was obtained after describing risk and benefits of the procedure with the patient, this includes bleeding, bruising, infection and medication side effects.  The patient wishes to proceed and has given written consent.  The patient was placed in a seated  position.  The bilateral  area was marked and prepped with alcohol.  It was entered with a 22-gauge 1-1/2 inch needle and 1 mL of 1% lidocaine was injected into each of 2 trigger points, after negative draw back for blood.  The patient tolerated the procedure well.  Post procedure instructions were given.   Lot [id number] Exp 08/2025

## 2023-05-05 NOTE — Progress Notes (Addendum)
PROGRESS NOTE   Subjective/Complaints:  2 more episode lasting a couple minutes with abilty to follow commands and abiltiy to open eyes during tese events without post ictal state, no incont   Arguing with boyfriend who is in room when I entered   Main complaint is poor sleep and L>R upper trapezius pain which radiates to occipital area   ROS- neg CP, SOB, N/V/D  Objective:   CT HEAD WO CONTRAST ( ) Result Date: 05/05/2023 CLINICAL DATA:  Stroke follow-up. EXAM: CT HEAD WITHOUT CONTRAST TECHNIQUE: Contiguous axial images were obtained from the base of the skull through the vertex without intravenous contrast. RADIATION DOSE REDUCTION: This exam was performed according to the departmental dose-optimization program which includes automated exposure control, adjustment of the mA and/or kV according to patient size and/or use of iterative reconstruction technique. COMPARISON:  04/30/2023 head CT FINDINGS: Brain: No evidence of acute infarction, hemorrhage, hydrocephalus, extra-axial collection or mass lesion/mass effect. Stable low-density in the left pons. Vascular: No hyperdense vessel or unexpected calcification. Skull: Normal. Negative for fracture or focal lesion. Sinuses/Orbits: No acute finding. IMPRESSION: No hemorrhage or progression seen at the patient's recent left pontine infarct. No new abnormality. Electronically Signed   By: Tiburcio Pea M.D.   On: 05/05/2023 06:23   EEG adult Result Date: 05/05/2023 Charlsie Quest, MD     05/05/2023 12:06 AM Patient Name: Brittany Mullins MRN: [id number] Epilepsy Attending: Charlsie Quest Referring Physician/Provider: Erick Colace, MD Date: 05/04/2023 Duration: 26.37 mins  Patient history: 43 year old female with a PMHx significant for seizures (on Keppra) and HTN who presents to the emergency department for evaluation of acute onset of right face, arm and leg numbness with  weakness. EEG to evaluate for seizure.  Level of alertness: Awake  AEDs during EEG study: LEV, GBP  Technical aspects: This EEG study was done with scalp electrodes positioned according to the 10-20 International system of electrode placement. Electrical activity was reviewed with band pass filter of 1-70Hz , sensitivity of 7 uV/mm, display speed of 31mm/sec with a 60Hz  notched filter applied as appropriate. EEG data were recorded continuously and digitally stored.  Video monitoring was available and reviewed as appropriate.  Description: The posterior dominant rhythm consists of 9 Hz activity of moderate voltage (25-35 uV) seen predominantly in posterior head regions, symmetric and reactive to eye opening and eye closing. Hyperventilation and photic stimulation were not performed.   EEG was technically difficult due to significant myogenic artifact  IMPRESSION: This technically difficult study is within normal limits. No seizures or epileptiform discharges were seen throughout the recording.  A normal interictal EEG does not exclude the diagnosis of epilepsy.  Charlsie Quest   MR CERVICAL SPINE WO CONTRAST Result Date: 05/03/2023 CLINICAL DATA:  Cervical radiculopathy, no red flags. EXAM: MRI CERVICAL SPINE WITHOUT CONTRAST TECHNIQUE: Multiplanar, multisequence MR imaging of the cervical spine was performed. No intravenous contrast was administered. COMPARISON:  CT cervical spine December 21, 2017. FINDINGS: Alignment: Cervical kyphosis centered at C5, secondary to a 3 mm anterolisthesis of C4 over C5 and trace anterolisthesis of C3 over C4, progressed compared to prior CT. Vertebrae: Diffuse decrease of the T1  signal of the visualized osseous structures. No focal marrow lesion, fracture or evidence of discitis. Cord: Normal signal and morphology. Posterior Fossa, vertebral arteries, paraspinal tissues: T2 hyperintense signal on the left side of the pons corresponding to pontine stroke seen on prior MRI of the  brain. Enlarged, partial empty sella. Two mildly prominent left medial supraclavicular lymph nodes appear similar two CT performed in 2019. Disc levels: Perineural cysts are seen bilaterally from C4-5 through C7-T1, more prominent on the right side. C2-3: No spinal canal or neural foraminal stenosis. C3-4: Minimal disc bulge, mild to moderate facet degenerative changes and ligamentum flavum redundancy resulting in minimal bilateral neural foraminal narrowing. No spinal canal stenosis. C4-5: Anterolisthesis and minimal disc bulge resulting in mild spinal canal stenosis. Mild to moderate right and advanced left facet degenerative changes resulting in mild left neural foraminal narrowing. C5-6: Posterior disc osteophyte complex asymmetric to the right causes mild mass effect on the right anterior cord surface without cord signal abnormality. Uncovertebral and facet degenerative changes contribute for mild right neural foraminal narrowing. C6-7: Small posterior disc osteophyte complex, uncovertebral and facet degenerative changes without significant spinal canal or neural foraminal stenosis. C7-T1: No spinal canal neural foraminal stenosis. IMPRESSION: 1. Anterolisthesis of C4 over C5, progressed since prior CT, facet mediated. 2. Degenerative changes of the cervical spine with mild spinal canal stenosis at C4-5 and C5-6. 3. Mild neural foraminal narrowing on the left at C4-5 and on the right at C5-6. 4. Diffuse decrease of the T1 signal of the visualized osseous structures, which may be seen in the setting of anemia or other marrow replacing processes. Electronically Signed   By: Baldemar Lenis M.D.   On: 05/03/2023 10:13   Recent Labs    05/02/23 0824  WBC 7.5  HGB 11.7*  HCT 35.4*  PLT 209   Recent Labs    05/02/23 0824  NA 139  K 3.4*  CL 106  CO2 25  GLUCOSE 122*  BUN 21*  CREATININE 0.87  CALCIUM 8.4*    Intake/Output Summary (Last 24 hours) at 05/05/2023 0739 Last data filed  at 05/04/2023 1812 Gross per 24 hour  Intake 660 ml  Output --  Net 660 ml        Physical Exam: Vital Signs Blood pressure 125/80, pulse 83, temperature 98.4 F (36.9 C), resp. rate 20, height 5\' 2"  (1.575 m), weight 70.3 kg, SpO2 96%.   General: No acute distress Mood and affect are appropriate Heart: Regular rate and rhythm no rubs murmurs or extra sounds Lungs: Clear to auscultation, breathing unlabored, no rales or wheezes Abdomen: Positive bowel sounds, soft nontender to palpation, nondistended Extremities: No clubbing, cyanosis, or edema Skin: No evidence of breakdown, no evidence of rash Neurologic: Cranial nerves II through XII intact, motor strength is 3- /5 in bilateral deltoid, bicep, tricep,RIght  grip, 5/5 Left grip, 5/5 left and 4/5 right hip flexor, knee extensors, ankle dorsiflexor and plantar flexor Some pain inhibition LUE  Sensory examreduced LT sensation RIght 4th and 5th digit, left shoulder  Cerebellar exam limited by strength on right and pain on left  Musculoskeletal: Full range of motion in all 4 extremities. No joint swelling Moving Left shoulder better Trigger point Left upper trap   Assessment/Plan: 1. Functional deficits which require 3+ hours per day of interdisciplinary therapy in a comprehensive inpatient rehab setting. Physiatrist is providing close team supervision and 24 hour management of active medical problems listed below. Physiatrist and rehab team continue to  assess barriers to discharge/monitor patient progress toward functional and medical goals  Care Tool:  Bathing    Body parts bathed by patient: Abdomen, Chest, Front perineal area, Right upper leg, Left upper leg, Left lower leg, Face, Left arm, Right arm   Body parts bathed by helper: Right lower leg, Buttocks, Right arm, Left arm     Bathing assist Assist Level: Minimal Assistance - Patient > 75%     Upper Body Dressing/Undressing Upper body dressing   What is the patient  wearing?: Bra, Pull over shirt    Upper body assist Assist Level: Moderate Assistance - Patient 50 - 74%    Lower Body Dressing/Undressing Lower body dressing      What is the patient wearing?: Underwear/pull up, Pants     Lower body assist Assist for lower body dressing: Contact Guard/Touching assist     Toileting Toileting    Toileting assist Assist for toileting: Contact Guard/Touching assist     Transfers Chair/bed transfer  Transfers assist     Chair/bed transfer assist level: Moderate Assistance - Patient 50 - 74%     Locomotion Ambulation   Ambulation assist      Assist level: Moderate Assistance - Patient 50 - 74% Assistive device: No Device Max distance: 10   Walk 10 feet activity   Assist     Assist level: Moderate Assistance - Patient - 50 - 74% Assistive device: No Device   Walk 50 feet activity   Assist Walk 50 feet with 2 turns activity did not occur: Safety/medical concerns         Walk 150 feet activity   Assist Walk 150 feet activity did not occur: Safety/medical concerns         Walk 10 feet on uneven surface  activity   Assist Walk 10 feet on uneven surfaces activity did not occur: Safety/medical concerns         Wheelchair     Assist Is the patient using a wheelchair?: Yes Type of Wheelchair: Manual      Max wheelchair distance: 50    Wheelchair 50 feet with 2 turns activity    Assist        Assist Level: Minimal Assistance - Patient > 75%   Wheelchair 150 feet activity     Assist      Assist Level: Maximal Assistance - Patient 25 - 49%   Blood pressure 125/80, pulse 83, temperature 98.4 F (36.9 C), resp. rate 20, height 5\' 2"  (1.575 m), weight 70.3 kg, SpO2 96%.  Medical Problem List and Plan: 1. Functional deficits secondary to Lft ventral pontine CVA, likely small vessel disease             -patient may shower             -ELOS/Goals: 10-14 days,  Mod I to Sup PT/OT/SLP              -Admit to CIR 2.  Antithrombotics: -DVT/anticoagulation:  Pharmaceutical: Lovenox             -antiplatelet therapy: DAPT X 3 weeks followed by ASA alone.  3. Pain Management: On Methadone (followed by [facility]).             --now on prn oxycodone due to shoulder/arm pain             --gabapentin TID for neuropathy 4. H/o bipolar d/o/Mood/Behavior/Sleep: LCSW to follow for evaluation and support.              --  Trazodone prn for insomnia              -antipsychotic agents: N/A--has not been on any psych meds for years.  5. Neuropsych/cognition: This patient is capable of making decisions on her own behalf. 6. Skin/Wound Care: Routine pressure relief measures. 7. Fluids/Electrolytes/Nutrition: Monitor I/O. Mild hypoK may worsen with prednisone will supplement KCL daily  8. HTN: Monitor BP TID--continue Cozaar and Norvasc. Long term goal normotensive 9. Seizure d/o: Now on Keppra TID 10. Addison's disease: Now on Cortef 10 mg am/5 mg pm 11. Chronic pancreatitis: Has chronic issues with nausea --continue phenergan --continue PPI 13. Constipation: Reports rectal bleeding-->colace added to senna due to hx of hems.   14. Left arm shoulder/elbow pain             -Xray elbow/shoulder 1/28 negative Suspect myofascial pain +/- frozen shoulder - improved shoulder ROM, still with upper trap trigger point - may benefit from trigger point injection  15. Marijuana use             -Advise cessation, associated with increased stroke risk 26-44% depending on study  16. Hx of pituitary tumor             -f/u Neurosurgery 17. Low protein S             -f/u Hematology outpatient, will need retesting in ~2mo  18.  Hx sensorineural hearing loss with resultant dysarthria  19.  LUE numbness C5 distribution shoulder pain , ? Proximal weakness vs pain inhibition , only mild C4-5 stenosis on MRI  , MRI reviewed with Neuroradiology , no spinal cord signal abnormalities Neurosurg, Dr Dawley looked at  MRI , C spine looks stable  Still has subjective numbess in left shoulder   20.  Insomnia- likely multifactorial, anxiety regarding medical condition, underlying anxiety d/o and on higher dose steroid taper - expect some improvement with steroid taper , will order hydroxyzine for anxiety , may consider seroquel at noc vs ambien.  Asking psych to eval , no benzos noted on initial urine tox  21.  Seizure like episode,  Prior hx but not well documented in medical record, recent Left pontine infarct (lower incidence of seizure for this anatomic location vs cortical) also with empty sella syndrome with diagnosis of addison's disease , already on moderate dose Keppra- neuro to eval  LOS: 4 days A FACE TO FACE EVALUATION WAS PERFORMED  Erick Colace 05/05/2023, 7:39 AM

## 2023-05-05 NOTE — Procedures (Signed)
Patient Name: Brittany Mullins  MRN: [id number]  Epilepsy Attending: Charlsie Quest  Referring Physician/Provider: Erick Colace, MD  Date: 05/04/2023 Duration: 26.37 mins   Patient history: 43 year old female with a PMHx significant for seizures (on Keppra) and HTN who presents to the emergency department for evaluation of acute onset of right face, arm and leg numbness with weakness. EEG to evaluate for seizure.   Level of alertness: Awake   AEDs during EEG study: LEV, GBP   Technical aspects: This EEG study was done with scalp electrodes positioned according to the 10-20 International system of electrode placement. Electrical activity was reviewed with band pass filter of 1-70Hz , sensitivity of 7 uV/mm, display speed of 42mm/sec with a 60Hz  notched filter applied as appropriate. EEG data were recorded continuously and digitally stored.  Video monitoring was available and reviewed as appropriate.   Description: The posterior dominant rhythm consists of 9 Hz activity of moderate voltage (25-35 uV) seen predominantly in posterior head regions, symmetric and reactive to eye opening and eye closing. Hyperventilation and photic stimulation were not performed.     EEG was technically difficult due to significant myogenic artifact   IMPRESSION: This technically difficult study is within normal limits. No seizures or epileptiform discharges were seen throughout the recording.   A normal interictal EEG does not exclude the diagnosis of epilepsy.   Horace Lukas Annabelle Harman

## 2023-05-05 NOTE — Progress Notes (Signed)
 LTM EEG hooked up and running - no initial skin breakdown - push button tested - [facility] monitoring.

## 2023-05-05 NOTE — Progress Notes (Signed)
[facility] monitoring is following patient for EEG monitoring. They called this nurse stating they saw patients boyfriend give her pills from pocket , spilled them out on the bedside table and handed her pills to take . This nurse went into room and asked boyfriend what he gave her , he states it was dramamine to help her sleep. Vitals signs stable. Patient A/Ox 4. He states he was not aware of policy to not give patient anything over the counter. Teacher, English as a foreign language and MD on call made aware. Ordered a STAT UDS and pending results. Told boyfriend he would need to leave based on this is not the first occurrence. Patient stated if boyfriend had to leave she was leaving as well. MD at beside talking to patient and boyfriend.   Tori Milks LPN
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
## 2023-05-05 NOTE — Progress Notes (Signed)
Patient continues to endorse pain the neck and shoulders unrelieved with PRN precocet. Patient slept about 1 hour last night. PRN trazodone given with no effects.

## 2023-05-05 NOTE — Progress Notes (Addendum)
STAT UDS result  + benzos (none prescribed po but had IV midazolam Friday which may linger for 3d) + THC- for chronic users may remain positive during abstinence for several weeks Will give seroquel instead of alprazolam Repeat UDS in am  per Psych consult Add Abilify start 5mg  every day in am Start Lamictal as mood stabilizer 25mg  BID

## 2023-05-05 NOTE — Progress Notes (Addendum)
At 0430 nurse called to room by patient's boyfriend as patient was "not acting right again". Patient noted to have eyes closed and left arm and leg drawing up towards body. Some twitching also noted to left side of face. Episode lasted about 3 minute. After episode patient very tearful, eyes closed but shakes head yes/no to questions. At 0440 another episode of jerking and drawing up noted on left side again. This lasted approximately another 3 minutes before patient appeared to relax. Vitals in chart. Consulting civil engineer and [facility] [employer] called. Post second seizure like activity patient appears back to baseline answering questions appropriately. [address] PA notified. CT head order changed to stat. Accompanied patient to CT. Patient continues to endorse pain to neck and shoulders along with some numbness to both shoulder and to feet. Assisted patient to toilet with 1 assist stand pivot this morning. Patient tolerated well. PRN percocet given for pain with no relief. Only one hour of sleep noted this shift.   Flutter valve and IS supplied to patient and education reinforced with teach back.

## 2023-05-05 NOTE — Progress Notes (Signed)
Pt threatening to leave if boyfriend cannot stay, he only sees her on the weekend .  We discussed that if she leaves it would be AMA and she would d/c without prescriptions.  She states that she does not care and that she has "seizure med" and methadone at home.  We discussed that we need to complete EEG monitor x 24h.   Psych consult for BPD  is pending as well Ideally complete inpt rehab  Discussed that if UDS ok , boyfriend can return in 24h Pt agreed to this  Gave orders for Xanax 1mg  po

## 2023-05-05 NOTE — Consult Note (Addendum)
[facility] Psychiatric Consult Initial  Patient Name: .Brittany Mullins  MRN: [id number]  DOB: 07-24-1980  Consult Order details:  Orders (From admission, onward)     Start     Ordered   05/05/23 0925  IP CONSULT TO PSYCHIATRY       Ordering Provider: Erick Colace, MD  Provider:  (Not yet assigned)  Question Answer Comment  Location [facility]   Reason for Consult? BPD      05/05/23 0924             Mode of Visit: In person    Psychiatry Consult Evaluation  Service Date: May 05, 2023 LOS:  LOS: 4 days  Chief Complaint "I haven't slept in 4 days and I have racing thoughts and mood swing."   Primary Psychiatric Diagnoses  Bipolar I disorder with mixed features.  2.  Cannabis induced mood disorder.   Assessment  Brittany Mullins is a 43 y.o. female admitted: Medicallyfor 05/01/2023  5:12 PM for left pontine stroke. She carries the psychiatric diagnoses of Bipolar disorder and Cannabis use disorder and has a past medical history of Addison's disease, PCOS, pancreatitis, seizures, and chronic pain on methadone.  Her current presentation of decreased need for sleep, racing thoughts, distractibility, mood swing, high energy level, feeling overwhelmed, lack of motivation, crying spells, and problem concentration is most consistent with  Bipolar I disorder with mixed features. She does not meets criteria for inpatient admission  based on absent suicidal or homicidal ideation, intent or plan.  Patient is not currently on any psychotropic medications. She was previously prescribed Risperidone (unknown dose) for Bipolar disorder but stopped years ago due to excessive weight gain. She was not compliant with medications prior to admission as evidenced by patient reports. On initial examination, patient is awake, alert and oriented x 4. Please see plan below for detailed recommendations.   Diagnoses:  Active Hospital problems: Principal  Problem:   Left pontine stroke [facility])    Plan   ## Psychiatric Medication Recommendations:  --Consider Abilify 7.5 mg daily for manic episode-due to low risk of reducing seizure threshold --Consider Lamotrigine 25 mg twice daily and up titrate as needed-For mood stabilization and for seizure prophylaxis. Lamotrigine also has less risk of weight gain compared to other mood stabilizers e.g Depakote. --Continue Gabapentin 300 mg TID for neuropathic pain/anxiety. --Increase Trazodone to 50 mg at bedtime for sleep.   ## Medical Decision Making Capacity: Not specifically addressed in this encounter  ## Further Work-up:  -- None at this time. -- most recent EKG on 04/24/2023 had QtC of 499 -- Pertinent labwork reviewed earlier this admission includes: Hb-11.7, THC-+ve,    ## Disposition:-- There are no psychiatric contraindications to discharge at this time  ## Behavioral / Environmental: -Delirium Precautions: Delirium Interventions for Nursing and Staff: - RN to open blinds every AM. - To Bedside: Glasses, hearing aide, and pt's own shoes. Make available to patients. when possible and encourage use. - Encourage po fluids when appropriate, keep fluids within reach. - OOB to chair with meals. - Passive ROM exercises to all extremities with AM & PM care. - RN to assess orientation to person, time and place QAM and PRN. - Recommend extended visitation hours with familiar family/friends as feasible. - Staff to minimize disturbances at night. Turn off television when pt asleep or when not in use.    ## Safety and Observation Level:  - Based on my clinical evaluation, I estimate the patient  to be at low risk of self harm in the current setting. - At this time, we recommend  routine. This decision is based on my review of the chart including patient's history and current presentation, interview of the patient, mental status examination, and consideration of suicide risk including evaluating suicidal  ideation, plan, intent, suicidal or self-harm behaviors, risk factors, and protective factors. This judgment is based on our ability to directly address suicide risk, implement suicide prevention strategies, and develop a safety plan while the patient is in the clinical setting. Please contact our team if there is a concern that risk level has changed.  CSSR Risk Category:C-SSRS RISK CATEGORY: No Risk  Suicide Risk Assessment: Patient has following modifiable risk factors for suicide: medication noncompliance, which we are addressing by prescribing medications. Patient has following non-modifiable or demographic risk factors for suicide: psychiatric hospitalization Patient has the following protective factors against suicide: Supportive family and Pets in the home  Thank you for this consult request. Recommendations have been communicated to the primary team.  We will follow up at this time.   Fredonia Highland, MD       History of Present Illness  Relevant Aspects of [facility] Course:  Admitted on 05/01/2023 for stroke workup. Patient is 48 tear old female with Addison's disease, PCOS, pancreatitis with chronic N/V, seizures, PUD/duodenitis, chronic pain (abdominal due to pancreatitis)- on methadone, bipolar d/o, marijuana use  who was admitted on 04/22/23 with reports of right sided numbness that started day before as well as reports of abnormal gait with complaints of left calf pain per family.  UDS positive for THC. CTA head/neck was negative for LVO. MRI brain done revealing acute paramedian left pontine infarct and ultrasound BLE dopplers were negative for DVT. She was noted to have slurred speech with intermittent aphasia and recommended DAPT X 3 weeks for stroke felt to be due to small vessel disease.    Patient Report:  Patient seen face to face in her hospital room. She is awake and alert, oriented to time, place, person and situation. Patient reports she has not slept in 4 days,  with racing thoughts, mood swing, distractibility and high energy level. Additionally, patient reports feeling overwhelmed, lack of motivation, crying spells, problem concentrating, excessive nervousness and apprehension because of her chronic pain. However, patient denies delusions, auditory or visual hallucination, perceptual disturbances, idea of reference, thought insertion or thought broadcasting. She denies suicidal or homicidal ideation, intent or plan. Patient denies previous suicide attempts and is able to contract for safety.  Patient reports previous diagnoses of Bipolar disorder after she was admitted at [facility] in 2012. She was prescribed Risperidone and ?Clonidine (unknown dose), but she stopped taking Risperidone due to excessive weight gain. Patient is not sure if she was prescribed a mood stabilizer. She does not currently have a psychiatrist but follows up with a Neurologist who prescribes her Keppra for Seizures. Patient reports use of CBD gummy which she gets over the counter, she also takes Methadone 120 mg daily for chronic pain. Patient indicates she follows up at [facility] treatment center for her methadone.    Psych ROS:  Depression:patient reports crying spells, lack of motivation, feeling overwhelmed, and problem concentrating.  Anxiety: patient reports being apprehensive, nervous, irritable with poor concentration.  Mania (lifetime and current): She reports mood swing, decreased need for sleep, distractibility, racing thoughts, and having excessive energy level.  Psychosis: (lifetime and current): patient denies delusions, hallucinations, perceptual abnormality, or idea of  reference.   Collateral information:  Unable to contact any family members as patient refused.   ROS   Psychiatric and Social History  Psychiatric History:  Information collected from patient  Prev Dx/Sx: Bipolar disorder Current Psych Provider: None  Home Meds (current): None  Previous  Med Trials: Risperidone and ?Clonidine (unknown dose) Therapy: denies   Prior Psych Hospitalization: yes, was admitted at [facility] for manic episode in 2012.   Prior Self Harm: denies  Prior Violence: denies   Family Psych History: Mother and father were diagnosed with Bipolar disorder. Patient's son has history of PTSD and Narcolepsy.  Family Hx suicide: denies   Social History:  Educational Hx: high school graduate Occupational Hx: unknown  Armed forces operational officer Hx: patient denies  Living Situation: lives with her boyfriend and her service dog Spiritual Hx: unknwon  Access to weapons/lethal means: denies    Substance History Alcohol: denies   Type of alcohol N/A Last Drink N/A Number of drinks per day N/A History of alcohol withdrawal seizures N/A History of DT's N/A Tobacco: N/A Illicit drugs: CBD gummy and prescription opioid abuse (on methadone treatment) Prescription drug abuse: opioid  Rehab hx: denies   Exam Findings  Physical Exam:   General: No acute distress Heart: Regular rate and rhythm no rubs murmurs or extra sounds Lungs: Clear to auscultation, breathing unlabored, no rales or wheezes Abdomen: Positive bowel sounds, soft nontender to palpation, nondistended Extremities: No clubbing, cyanosis, or edema Skin: No evidence of breakdown, no evidence of rash Neurologic: Cranial nerves II through XII intact, motor strength is 3- /5 in bilateral deltoid, bicep, tricep,RIght  grip, 5/5 Left grip, 5/5 left and 4/5 right hip flexor, knee extensors, ankle dorsiflexor and plantar flexor Some pain inhibition LUE  Sensory examreduced LT sensation RIght 4th and 5th digit, left shoulder  Cerebellar exam limited by strength on right and pain on left  Musculoskeletal: Full range of motion in all 4 extremities. No joint swelling Moving Left shoulder better Trigger point Left upper trap     Vital Signs:  Temp:  [97.6 F (36.4 C)-98.6 F (37 C)] 97.6 F (36.4 C) (02/02  1035) Pulse Rate:  [76-96] 79 (02/02 1035) Resp:  [17-20] 20 (02/02 0507) BP: (118-141)/(66-81) 118/66 (02/02 1035) SpO2:  [96 %-100 %] 99 % (02/02 1035) Blood pressure 118/66, pulse 79, temperature 97.6 F (36.4 C), resp. rate 20, height 5\' 2"  (1.575 m), weight 70.3 kg, SpO2 99%. Body mass index is 28.35 kg/m.  Physical Exam  Mental Status Exam: General Appearance: Casual  Orientation:  Full (Time, Place, and Person)  Memory:  Immediate;   Good  Concentration:  Concentration: Poor  Recall:  Fair  Attention  Poor  Eye Contact:  Fair  Speech:  Normal Rate  Language:  Good  Volume:  Decreased  Mood: "worried"  Affect:  Labile and Tearful  Thought Process:  Goal Directed  Thought Content:  Logical  Suicidal Thoughts:  No  Homicidal Thoughts:  No  Judgement:  Good  Insight:  Good  Psychomotor Activity:  Decreased  Akathisia:  No  Fund of Knowledge:  Fair      Assets:  Engineer, maintenance Social Support  Cognition:  WNL  ADL's:  Impaired  AIMS (if indicated):        Other History   These have been pulled in through the EMR, reviewed, and updated if appropriate.  Family History:  The patient's family history includes Cancer in her mother; Diabetes in her maternal grandfather  and maternal grandmother; Hypertension in her mother.  Medical History: Past Medical History:  Diagnosis Date   Addison's disease (HCC)    Brain tumor (benign) (HCC)    Chronic pancreatitis (HCC)    Hearing loss    Hypertension    Marijuana use    Per pt: "medical marijuana patient"   Pancreatitis    PCOS (polycystic ovarian syndrome)    Pituitary tumor    Seizures (HCC)     Surgical History: Past Surgical History:  Procedure Laterality Date   ABDOMINAL HYSTERECTOMY     APPENDECTOMY     BIOPSY  08/16/2021   Procedure: BIOPSY;  Surgeon: Shellia Cleverly, DO;  Location: [facility];  Service: Gastroenterology;;   CESAREAN SECTION     CHOLECYSTECTOMY     ELBOW SURGERY      ESOPHAGOGASTRODUODENOSCOPY (EGD) WITH PROPOFOL N/A 08/16/2021   Procedure: ESOPHAGOGASTRODUODENOSCOPY (EGD) WITH PROPOFOL;  Surgeon: Shellia Cleverly, DO;  Location: [facility];  Service: Gastroenterology;  Laterality: N/A;   kidney stent     pancreatic stent     RADIOLOGY WITH ANESTHESIA N/A 04/25/2023   Procedure: MRI of Brain without contrast;  Surgeon: Radiologist, Medication, MD;  Location: [facility] OR;  Service: Radiology;  Laterality: N/A;     Medications:   Current Facility-Administered Medications:    acetaminophen (TYLENOL) tablet 325-650 mg, 325-650 mg, Oral, Q4H PRN, Love, Pamela S, PA-C, 650 mg at 05/04/23 0250   albuterol (PROVENTIL) (2.5 MG/3ML) 0.083% nebulizer solution 2.5 mg, 2.5 mg, Nebulization, Q2H PRN, Love, Pamela S, PA-C   alum & mag hydroxide-simeth (MAALOX/MYLANTA) 200-200-20 MG/5ML suspension 30 mL, 30 mL, Oral, Q4H PRN, Love, Pamela S, PA-C   amLODipine (NORVASC) tablet 5 mg, 5 mg, Oral, Daily, Love, Pamela S, PA-C, 5 mg at 05/05/23 4098   aspirin EC tablet 81 mg, 81 mg, Oral, Daily, Love, Pamela S, PA-C, 81 mg at 05/05/23 1191   atorvastatin (LIPITOR) tablet 40 mg, 40 mg, Oral, Daily, Love, Pamela S, PA-C, 40 mg at 05/05/23 4782   bisacodyl (DULCOLAX) suppository 10 mg, 10 mg, Rectal, Daily PRN, Love, Pamela S, PA-C   blood pressure control book, , Does not apply, Once, Kirsteins, Victorino Sparrow, MD   capsaicin (ZOSTRIX) 0.025 % cream, , Topical, BID, Kirsteins, Victorino Sparrow, MD, Given at 05/05/23 [phone]   clopidogrel (PLAVIX) tablet 75 mg, 75 mg, Oral, Daily, Love, Pamela S, PA-C, 75 mg at 05/05/23 1308   cyclobenzaprine (FLEXERIL) tablet 5 mg, 5 mg, Oral, TID PRN, Love, Pamela S, PA-C, 5 mg at 05/04/23 0250   enoxaparin (LOVENOX) injection 40 mg, 40 mg, Subcutaneous, Q24H, Love, Pamela S, PA-C, 40 mg at 05/04/23 1738   gabapentin (NEURONTIN) capsule 300 mg, 300 mg, Oral, TID, Love, Pamela S, PA-C, 300 mg at 05/05/23 0841   guaiFENesin-dextromethorphan (ROBITUSSIN DM) 100-10  MG/5ML syrup 5-10 mL, 5-10 mL, Oral, Q6H PRN, Love, Pamela S, PA-C   hydrocortisone (CORTEF) tablet 5 mg, 5 mg, Oral, QAC supper, 5 mg at 05/04/23 1739 **AND** hydrocortisone (CORTEF) tablet 10 mg, 10 mg, Oral, Q breakfast, Love, Pamela S, PA-C, 10 mg at 05/05/23 6578   hydrOXYzine (ATARAX) tablet 10 mg, 10 mg, Oral, TID PRN, Kirsteins, Victorino Sparrow, MD   levETIRAcetam (KEPPRA) tablet 750 mg, 750 mg, Oral, Q8H, Love, Pamela S, PA-C, 750 mg at 05/05/23 4696   levonorgestrel-ethinyl estradiol (ALESSE) 0.1-20 MG-MCG per tablet 1 tablet, 1 tablet, Oral, Daily, Love, Pamela S, PA-C   LORazepam (ATIVAN) injection 2 mg, 2 mg, Intravenous, Q6H PRN,  de Saintclair Halsted, Cortney E, NP   losartan (COZAAR) tablet 25 mg, 25 mg, Oral, Daily, Love, Pamela S, PA-C, 25 mg at 05/05/23 2536   methadone (DOLOPHINE) tablet 120 mg, 120 mg, Oral, Daily, Love, Pamela S, PA-C, 120 mg at 05/05/23 0841   naloxone [facility]) injection 0.4 mg, 0.4 mg, Intravenous, PRN, Love, Pamela S, PA-C   oxyCODONE-acetaminophen (PERCOCET/ROXICET) 5-325 MG per tablet 1-2 tablet, 1-2 tablet, Oral, Q6H PRN, Love, Pamela S, PA-C, 2 tablet at 05/05/23 0206   pantoprazole (PROTONIX) EC tablet 40 mg, 40 mg, Oral, Q0600, Love, Pamela S, PA-C, 40 mg at 05/05/23 6440   polyethylene glycol (MIRALAX / GLYCOLAX) packet 17 g, 17 g, Oral, Daily PRN, Love, Pamela S, PA-C   [COMPLETED] predniSONE (DELTASONE) tablet 50 mg, 50 mg, Oral, Q breakfast, 50 mg at 05/03/23 1250 **FOLLOWED BY** [COMPLETED] predniSONE (DELTASONE) tablet 40 mg, 40 mg, Oral, Q breakfast, 40 mg at 05/04/23 0930 **FOLLOWED BY** [COMPLETED] predniSONE (DELTASONE) tablet 30 mg, 30 mg, Oral, Q breakfast, 30 mg at 05/05/23 0842 **FOLLOWED BY** [START ON 05/06/2023] predniSONE (DELTASONE) tablet 20 mg, 20 mg, Oral, Q breakfast **FOLLOWED BY** [START ON 05/07/2023] predniSONE (DELTASONE) tablet 10 mg, 10 mg, Oral, Q breakfast **FOLLOWED BY** [START ON 05/08/2023] predniSONE (DELTASONE) tablet 5 mg, 5 mg, Oral, Q  breakfast, Love, Pamela S, PA-C   prochlorperazine (COMPAZINE) tablet 5-10 mg, 5-10 mg, Oral, Q6H PRN **OR** prochlorperazine (COMPAZINE) suppository 12.5 mg, 12.5 mg, Rectal, Q6H PRN **OR** prochlorperazine (COMPAZINE) injection 5-10 mg, 5-10 mg, Intravenous, Q6H PRN, Love, Pamela S, PA-C   promethazine (PHENERGAN) tablet 25 mg, 25 mg, Oral, Q6H PRN **OR** promethazine (PHENERGAN) suppository 25 mg, 25 mg, Rectal, Q6H PRN, Love, Pamela S, PA-C   senna-docusate (Senokot-S) tablet 2 tablet, 2 tablet, Oral, Q0600, Love, Pamela S, PA-C, 2 tablet at 05/05/23 3474   sodium phosphate (FLEET) enema 1 enema, 1 enema, Rectal, Once PRN, Love, Pamela S, PA-C   sorbitol 70 % solution 60 mL, 60 mL, Oral, Daily PRN, Love, Pamela S, PA-C   traZODone (DESYREL) tablet 25 mg, 25 mg, Oral, QHS PRN, Street, [place], [place], 25 mg at 05/04/23 2221  Allergies: Allergies  Allergen Reactions   Bee Venom Anaphylaxis   Toradol [Ketorolac Tromethamine] Shortness Of Breath   Benadryl [Diphenhydramine] Other (See Comments)    States she cannot breathe   Other     CT dye    Fredonia Highland, MD

## 2023-05-06 ENCOUNTER — Inpatient Hospital Stay (HOSPITAL_COMMUNITY): Payer: Medicare Other

## 2023-05-06 ENCOUNTER — Encounter (HOSPITAL_COMMUNITY): Payer: Self-pay | Admitting: Radiology

## 2023-05-06 DIAGNOSIS — F3177 Bipolar disorder, in partial remission, most recent episode mixed: Secondary | ICD-10-CM | POA: Diagnosis not present

## 2023-05-06 DIAGNOSIS — F1290 Cannabis use, unspecified, uncomplicated: Secondary | ICD-10-CM | POA: Diagnosis not present

## 2023-05-06 DIAGNOSIS — R569 Unspecified convulsions: Secondary | ICD-10-CM | POA: Diagnosis not present

## 2023-05-06 DIAGNOSIS — I639 Cerebral infarction, unspecified: Secondary | ICD-10-CM | POA: Diagnosis not present

## 2023-05-06 DIAGNOSIS — G40909 Epilepsy, unspecified, not intractable, without status epilepticus: Secondary | ICD-10-CM

## 2023-05-06 LAB — CBC
HCT: 36.5 % (ref 36.0–46.0)
Hemoglobin: 11.9 g/dL — ABNORMAL LOW (ref 12.0–15.0)
MCH: 30.8 pg (ref 26.0–34.0)
MCHC: 32.6 g/dL (ref 30.0–36.0)
MCV: 94.6 fL (ref 80.0–100.0)
Platelets: 248 10*3/uL (ref 150–400)
RBC: 3.86 MIL/uL — ABNORMAL LOW (ref 3.87–5.11)
RDW: 12.5 % (ref 11.5–15.5)
WBC: 9.2 10*3/uL (ref 4.0–10.5)
nRBC: 0 % (ref 0.0–0.2)

## 2023-05-06 LAB — BASIC METABOLIC PANEL
Anion gap: 7 (ref 5–15)
BUN: 22 mg/dL — ABNORMAL HIGH (ref 6–20)
CO2: 27 mmol/L (ref 22–32)
Calcium: 8.8 mg/dL — ABNORMAL LOW (ref 8.9–10.3)
Chloride: 105 mmol/L (ref 98–111)
Creatinine, Ser: 0.77 mg/dL (ref 0.44–1.00)
GFR, Estimated: >60 mL/min (ref >60)
Glucose, Bld: 74 mg/dL (ref 70–99)
Potassium: 4 mmol/L (ref 3.5–5.1)
Sodium: 139 mmol/L (ref 135–145)

## 2023-05-06 LAB — PROLACTIN: Prolactin: 31.7 ng/mL (ref 4.8–33.4)

## 2023-05-06 MED ORDER — GABAPENTIN 100 MG PO CAPS
200.0000 mg | ORAL_CAPSULE | Freq: Three times a day (TID) | ORAL | Status: DC
Start: 2023-05-06 — End: 2023-05-13
  Administered 2023-05-06 – 2023-05-13 (×19): 200 mg via ORAL
  Filled 2023-05-06 (×21): qty 2

## 2023-05-06 NOTE — Progress Notes (Signed)
Occupational Therapy Note  Patient Details  Name: Brittany Mullins MRN: [id number] Date of Birth: May 01, 1980  Today's Date: 05/06/2023 OT Missed Time: 60 Minutes Missed Time Reason: Patient unwilling/refused to participate without medical reason  Pt greeted supine in bed with lights off and EEG donned, pt reports " I do not want to do anything today." Pt reports having a bad night and being upset that her breakfast tray was wrong again. Pt missed 60 mins of OT session.    Pollyann Glen [place] 05/06/2023, 12:05 PM

## 2023-05-06 NOTE — Progress Notes (Signed)
Physical Therapy Session Note  Patient Details  Name: Brittany Mullins MRN: [id number] Date of Birth: 1980-05-06  Today's Date: 05/06/2023 PT Individual Time: 1033-1105 PT Individual Time Calculation (min): 32 min  and Today's Date: 05/06/2023 PT Missed Time: 13 Minutes Missed Time Reason: Patient unwilling to participate  Short Term Goals: Week 1:  PT Short Term Goal 1 (Week 1): Pt wil perform bed mobilty from flat surface with min assist to the L to simulate home environment PT Short Term Goal 2 (Week 1): Pt will perform transfers with min assist PT Short Term Goal 3 (Week 1): Pt will perform 4 steps with min assist PT Short Term Goal 4 (Week 1): Pt will be able to gait x 50' with min assist  Skilled Therapeutic Interventions/Progress Updates:  Patient supine in bed on entrance to room. EEG attached to pt and actively monitoring. Patient alert and agreeable to PT session.   Patient with no pain complaint at start of session.  Therapeutic Activity:  - relates feeling like she is not being heard.  - wants dog to come and visit but needs a rabies shot and boyfriend has not taken dog to get one.  - is upset that boyfriend is banned from coming to unit - talked with pt to build therapeutic alliance   Patient *** at end of session with brakes locked, *** alarm set, and all needs within reach.   Therapy Documentation Precautions:  Precautions Precautions: Fall Restrictions Weight Bearing Restrictions Per Provider Order: No General: PT Amount of Missed Time (min): 13 Minutes PT Missed Treatment Reason: Patient unwilling to participate  Pain:  No pain related this session, but pt does relate frustration with staff   Therapy/Group: Individual Therapy  Loel Dubonnet PT, DPT, CSRS 05/06/2023, 12:51 PM

## 2023-05-06 NOTE — Procedures (Signed)
Modified Barium Swallow Study  Patient Details  Name: Brittany Mullins MRN: [id number] Date of Birth: 1981/02/22  Today's Date: 05/06/2023  Modified Barium Swallow completed.  Full report located under Chart Review in the Imaging Section.  History of Present Illness 43 year old female with history of gastritis/duodenitis, Addison's disease, pituitary tumor, empty sella syndrome, HTN, opiate dependence on methadone, bipolar disease, THC use, PCOS, seizures presented to the ED on 04/22/23 with right sided weakness and numbness.      MRI showed left pontine infarct. CTA no LVO. No interventions due to outside of window. UDS positive THC. DVT prophylaxis with lovenox. Placed on ASA and Plavix for 3 weeks  and then ASA alone. Keppra continued as at home. Typically has 1 to 2 seizures per month but can increase to 1-2 seizures a week. Has service Dog for seizure detection. On no home meds for HTN. Placed on Norvasc and Cozaar. LDL 94 and placed on Lipitor. On methadone at home due to pain and nausea from chronic pancreatitis. Estrogen for PCOS.     Developed increased left sided weakness. Stroke team recommended repeat CT and  EEG. No evidence of definite seizures.  Keppra dose adjusted. Noted left upper extremity hypertonia flexion which had also raised concerns for seizure.  Clinical Impression Patient presents with oropharyngeal swallowing function that is within functional limits. Oral and pharyngeal clearance was timely and efficient and no significant airway invasion noted across all consistencies. Of note, patient reports globus sensation, intermittent burning/odynophagia when eating, burning sensation in nose and back of throat after waking up in the morning, and intermittent chest pain that is aleviated with eructation. Brief, nondiagnostic esophageal sweep revealed no overt esophageal clearance difficulty but recommend patient seek esophageal evaluation from GI to determine if esopahgeal  pathology may underlie her discomfort during meals. Recommend continuation of regular/thin liquid diet with medications taken whole in puree per patient preference.  Factors that may increase risk of adverse event in presence of aspiration Brittany Mullins & Clearance Brittany Mullins 2021):  n/a  Swallow Evaluation Recommendations Recommendations: PO diet PO Diet Recommendation: Regular;Thin liquids (Level 0) Liquid Administration via: Cup;Straw Medication Administration: Whole meds with puree Supervision: Patient able to self-feed Postural changes: Position pt fully upright for meals Oral care recommendations: Oral care BID (2x/day) Recommended consults: Consider GI consultation;Consider esophageal assessment  Brittany Mullins, M.A., CCC-SLP   Brittany Mullins 05/06/2023,10:04 AM

## 2023-05-06 NOTE — Progress Notes (Signed)
Patient states she is "feeling short of breath".> Vito Backers. notified. Patient given antianxiety medication.

## 2023-05-06 NOTE — Consult Note (Signed)
[facility] Psychiatric Consult Follow-up  Patient Name: .Brittany Mullins  MRN: [id number]  DOB: [birth year]  Consult Order details:  Orders (From admission, onward)     Start     Ordered   05/05/23 0925  IP CONSULT TO PSYCHIATRY       Ordering Provider: Erick Colace, MD  Provider:  (Not yet assigned)  Question Answer Comment  Location [facility]   Reason for Consult? BPD      05/05/23 0924            Mode of Visit: In person   Psychiatry Consult Evaluation  Service Date: May 06, 2023 LOS:  LOS: 5 days  Chief Complaint "I haven't slept in 4 days and I have racing thoughts and mood swing."   Primary Psychiatric Diagnoses  Bipolar I disorder with mixed features.  2.  Cannabis induced mood disorder.   Assessment  Brittany Mullins is a 43 y.o. female admitted: Medicallyfor 05/01/2023  5:12 PM for left pontine stroke. She carries the psychiatric diagnoses of Bipolar disorder and Cannabis use disorder and has a past medical history of Addison's disease, PCOS, pancreatitis, seizures, and chronic pain on methadone.  Her current presentation of decreased need for sleep, racing thoughts, distractibility, mood swing, high energy level, feeling overwhelmed, lack of motivation, crying spells, and problem concentration is most consistent with  Bipolar I disorder with mixed features. She does not meets criteria for inpatient admission  based on absent suicidal or homicidal ideation, intent or plan.  Patient is not currently on any psychotropic medications. She was previously prescribed Risperidone (unknown dose) for Bipolar disorder but stopped years ago due to excessive weight gain. She was not compliant with medications prior to admission as evidenced by patient reports. She was started on abilify, lamictal, and gabapentin. Currently reporting increased somnolence, will decrease gabapentin. Please see plan below for detailed recommendations.    Diagnoses:  Active Hospital problems: Principal Problem:   Left pontine stroke [facility]) Active Problems:   Bipolar I disorder with mixed features (HCC)    Plan   ## Psychiatric Medication Recommendations:  --Continue Abilify 5 mg daily for manic episode-due to low risk of reducing seizure threshold --Continue Lamotrigine 25 mg twice daily and up titrate as needed-For mood stabilization and for seizure prophylaxis. Lamotrigine also has less risk of weight gain compared to other mood stabilizers e.g Depakote. --Decrease Gabapentin 200 mg TID for neuropathic pain/anxiety given somnolence. --Continue Trazodone to 50 mg at bedtime for sleep.   ## Medical Decision Making Capacity: Not specifically addressed in this encounter  ## Further Work-up:  -- None at this time. -- most recent EKG on 04/24/2023 had QtC of 499 -- Pertinent labwork reviewed earlier this admission includes: Hb-11.7, THC-+ve,    ## Disposition:-- There are no psychiatric contraindications to discharge at this time  ## Behavioral / Environmental: -Delirium Precautions: Delirium Interventions for Nursing and Staff: - RN to open blinds every AM. - To Bedside: Glasses, hearing aide, and pt's own shoes. Make available to patients. when possible and encourage use. - Encourage po fluids when appropriate, keep fluids within reach. - OOB to chair with meals. - Passive ROM exercises to all extremities with AM & PM care. - RN to assess orientation to person, time and place QAM and PRN. - Recommend extended visitation hours with familiar family/friends as feasible. - Staff to minimize disturbances at night. Turn off television when pt asleep or when not in use.    ##  Safety and Observation Level:  - Based on my clinical evaluation, I estimate the patient to be at low risk of self harm in the current setting. - At this time, we recommend  routine. This decision is based on my review of the chart including patient's history and current  presentation, interview of the patient, mental status examination, and consideration of suicide risk including evaluating suicidal ideation, plan, intent, suicidal or self-harm behaviors, risk factors, and protective factors. This judgment is based on our ability to directly address suicide risk, implement suicide prevention strategies, and develop a safety plan while the patient is in the clinical setting. Please contact our team if there is a concern that risk level has changed.  CSSR Risk Category:C-SSRS RISK CATEGORY: No Risk  Suicide Risk Assessment: Patient has following modifiable risk factors for suicide: medication noncompliance, which we are addressing by prescribing medications. Patient has following non-modifiable or demographic risk factors for suicide: psychiatric hospitalization Patient has the following protective factors against suicide: Supportive family and Pets in the home  Thank you for this consult request. Recommendations have been communicated to the primary team.  We will follow up at this time.   Karie Fetch, MD, PGY-2       History of Present Illness  Relevant Aspects of [facility] Course:  Admitted on 05/01/2023 for stroke workup. Patient is 39 tear old female with Addison's disease, PCOS, pancreatitis with chronic N/V, seizures, PUD/duodenitis, chronic pain (abdominal due to pancreatitis)- on methadone, bipolar d/o, marijuana use  who was admitted on 04/22/23 with reports of right sided numbness that started day before as well as reports of abnormal gait with complaints of left calf pain per family.  UDS positive for THC. CTA head/neck was negative for LVO. MRI brain done revealing acute paramedian left pontine infarct and ultrasound BLE dopplers were negative for DVT. She was noted to have slurred speech with intermittent aphasia and recommended DAPT X 3 weeks for stroke felt to be due to small vessel disease.    Patient Report:  Patient seen in her room.  She is somnolent and notes that she has been feeling more groggy. She denies feeling increased depression, anxiety, high energy level. She denies SI/HI. She denies other AE to meds. She reports she has been sleeping this morning. Denies issues with eating.  Per past history, Patient reports previous diagnoses of Bipolar disorder after she was admitted at [facility] in 2012. She was prescribed Risperidone and ?Clonidine (unknown dose), but she stopped taking Risperidone due to excessive weight gain. Patient is not sure if she was prescribed a mood stabilizer. She does not currently have a psychiatrist but follows up with a Neurologist who prescribes her Keppra for Seizures. Patient reports use of CBD gummy which she gets over the counter, she also takes Methadone 120 mg daily for chronic pain. Patient indicates she follows up at [facility] treatment center for her methadone.   Psych ROS:  Depression:patient reports crying spells, lack of motivation, feeling overwhelmed, and problem concentrating.  Anxiety: patient reports being apprehensive, nervous, irritable with poor concentration.  Mania (lifetime and current): She reports mood swing, decreased need for sleep, distractibility, racing thoughts, and having excessive energy level.  Psychosis: (lifetime and current): patient denies delusions, hallucinations, perceptual abnormality, or idea of reference.   Collateral information:  Unable to contact any family members as patient refused.   Review of Systems  Constitutional:  Positive for malaise/fatigue.  Gastrointestinal: Negative.   Psychiatric/Behavioral:  Negative for depression  and suicidal ideas.      Psychiatric and Social History  Psychiatric History:  Information collected from patient  Prev Dx/Sx: Bipolar disorder Current Psych Provider: None  Home Meds (current): None  Previous Med Trials: Risperidone and ?Clonidine (unknown dose) Therapy: denies   Prior Psych  Hospitalization: yes, was admitted at [facility] for manic episode in 2012.   Prior Self Harm: denies  Prior Violence: denies   Family Psych History: Mother and father were diagnosed with Bipolar disorder. Patient's son has history of PTSD and Narcolepsy.  Family Hx suicide: denies   Social History:  Educational Hx: high school graduate Occupational Hx: unknown  Armed forces operational officer Hx: patient denies  Living Situation: lives with her boyfriend and her service dog Spiritual Hx: unknwon  Access to weapons/lethal means: denies    Substance History Alcohol: denies   Type of alcohol N/A Last Drink N/A Number of drinks per day N/A History of alcohol withdrawal seizures N/A History of DT's N/A Tobacco: N/A Illicit drugs: CBD gummy and prescription opioid abuse (on methadone treatment) Prescription drug abuse: opioid  Rehab hx: denies   Exam Findings  Vital Signs:  Temp:  [97.7 F (36.5 C)-98 F (36.7 C)] 97.9 F (36.6 C) (02/03 1501) Pulse Rate:  [56-62] 62 (02/03 1501) Resp:  [16-19] 19 (02/03 1501) BP: (103-134)/(61-93) 119/71 (02/03 1501) SpO2:  [96 %-98 %] 98 % (02/03 1501) Blood pressure 119/71, pulse 62, temperature 97.9 F (36.6 C), resp. rate 19, height 5\' 2"  (1.575 m), weight 70.3 kg, SpO2 98%. Body mass index is 28.35 kg/m.  Physical Exam HENT:     Head: Normocephalic.     Comments: EEG leads in place Pulmonary:     Effort: Pulmonary effort is normal.  Neurological:     Mental Status: She is lethargic.     Mental Status Exam: General Appearance: Casual  Orientation:  NA  Memory:  Immediate;   Fair  Concentration:  Concentration: Fair  Recall:  Fair  Attention  Poor  Eye Contact:  Fair  Speech:  Normal Rate  Language:  Good  Volume:  Decreased  Mood: "sleepy"  Affect:  Flat  Thought Process:  Goal Directed  Thought Content:  Logical  Suicidal Thoughts:  No  Homicidal Thoughts:  No  Judgement:  Fair  Insight:  Fair  Psychomotor Activity:  Decreased   Akathisia:  No  Fund of Knowledge:  Fair     Assets:  Engineer, maintenance Social Support  Cognition:  WNL  ADL's:  Impaired  AIMS (if indicated):        Other History   These have been pulled in through the EMR, reviewed, and updated if appropriate.  Family History:  The patient's family history includes Cancer in her mother; Diabetes in her maternal grandfather and maternal grandmother; Hypertension in her mother.  Medical History: Past Medical History:  Diagnosis Date   Addison's disease (HCC)    Brain tumor (benign) (HCC)    Chronic pancreatitis (HCC)    Hearing loss    Hypertension    Marijuana use    Per pt: "medical marijuana patient"   Pancreatitis    PCOS (polycystic ovarian syndrome)    Pituitary tumor    Seizures (HCC)     Surgical History: Past Surgical History:  Procedure Laterality Date   ABDOMINAL HYSTERECTOMY     APPENDECTOMY     BIOPSY  08/16/2021   Procedure: BIOPSY;  Surgeon: Shellia Cleverly, DO;  Location: [facility];  Service: Gastroenterology;;  CESAREAN SECTION     CHOLECYSTECTOMY     ELBOW SURGERY     ESOPHAGOGASTRODUODENOSCOPY (EGD) WITH PROPOFOL N/A 08/16/2021   Procedure: ESOPHAGOGASTRODUODENOSCOPY (EGD) WITH PROPOFOL;  Surgeon: Shellia Cleverly, DO;  Location: [facility];  Service: Gastroenterology;  Laterality: N/A;   kidney stent     pancreatic stent     RADIOLOGY WITH ANESTHESIA N/A 04/25/2023   Procedure: MRI of Brain without contrast;  Surgeon: Radiologist, Medication, MD;  Location: [facility] OR;  Service: Radiology;  Laterality: N/A;   RADIOLOGY WITH ANESTHESIA N/A 05/03/2023   Procedure: MRI WITH ANESTHESIA CERVICAL SPINE WITHOUT CONTRAST;  Surgeon: Radiologist, Medication, MD;  Location: [facility] OR;  Service: Radiology;  Laterality: N/A;     Medications:   Current Facility-Administered Medications:    acetaminophen (TYLENOL) tablet 325-650 mg, 325-650 mg, Oral, Q4H PRN, Love, Pamela S, PA-C, 650 mg at 05/04/23 0250    albuterol (PROVENTIL) (2.5 MG/3ML) 0.083% nebulizer solution 2.5 mg, 2.5 mg, Nebulization, Q2H PRN, Love, Pamela S, PA-C   alum & mag hydroxide-simeth (MAALOX/MYLANTA) 200-200-20 MG/5ML suspension 30 mL, 30 mL, Oral, Q4H PRN, Love, Pamela S, PA-C   amLODipine (NORVASC) tablet 5 mg, 5 mg, Oral, Daily, Love, Pamela S, PA-C, 5 mg at 05/06/23 0841   ARIPiprazole (ABILIFY) tablet 5 mg, 5 mg, Oral, Daily, Kirsteins, Victorino Sparrow, MD, 5 mg at 05/06/23 4098   aspirin EC tablet 81 mg, 81 mg, Oral, Daily, Love, Pamela S, PA-C, 81 mg at 05/06/23 1191   atorvastatin (LIPITOR) tablet 40 mg, 40 mg, Oral, Daily, Love, Pamela S, PA-C, 40 mg at 05/06/23 4782   bisacodyl (DULCOLAX) suppository 10 mg, 10 mg, Rectal, Daily PRN, Love, Pamela S, PA-C   blood pressure control book, , Does not apply, Once, Kirsteins, Victorino Sparrow, MD   capsaicin (ZOSTRIX) 0.025 % cream, , Topical, BID, Kirsteins, Victorino Sparrow, MD, 1 Application at 05/06/23 [phone]   clopidogrel (PLAVIX) tablet 75 mg, 75 mg, Oral, Daily, Love, Pamela S, PA-C, 75 mg at 05/06/23 0841   cyclobenzaprine (FLEXERIL) tablet 5 mg, 5 mg, Oral, TID PRN, Love, Pamela S, PA-C, 5 mg at 05/04/23 0250   enoxaparin (LOVENOX) injection 40 mg, 40 mg, Subcutaneous, Q24H, Love, Pamela S, PA-C, 40 mg at 05/05/23 1659   gabapentin (NEURONTIN) capsule 300 mg, 300 mg, Oral, TID, Love, Pamela S, PA-C, 300 mg at 05/06/23 1355   guaiFENesin-dextromethorphan (ROBITUSSIN DM) 100-10 MG/5ML syrup 5-10 mL, 5-10 mL, Oral, Q6H PRN, Love, Pamela S, PA-C   hydrocortisone (CORTEF) tablet 5 mg, 5 mg, Oral, QAC supper, 5 mg at 05/05/23 1659 **AND** hydrocortisone (CORTEF) tablet 10 mg, 10 mg, Oral, Q breakfast, Love, Pamela S, PA-C, 10 mg at 05/06/23 1308   hydrOXYzine (ATARAX) tablet 10 mg, 10 mg, Oral, TID PRN, Erick Colace, MD, 10 mg at 05/06/23 1258   lamoTRIgine (LAMICTAL) tablet 25 mg, 25 mg, Oral, BID, Kirsteins, Victorino Sparrow, MD, 25 mg at 05/06/23 0841   levETIRAcetam (KEPPRA) tablet 750 mg, 750 mg,  Oral, Q8H, Love, Pamela S, PA-C, 750 mg at 05/06/23 1355   LORazepam (ATIVAN) injection 2 mg, 2 mg, Intravenous, Q6H PRN, de Saintclair Halsted, Cortney E, NP   losartan (COZAAR) tablet 25 mg, 25 mg, Oral, Daily, Love, Pamela S, PA-C, 25 mg at 05/06/23 6578   methadone (DOLOPHINE) tablet 120 mg, 120 mg, Oral, Daily, Love, Pamela S, PA-C, 120 mg at 05/06/23 0839   naloxone [facility]) injection 0.4 mg, 0.4 mg, Intravenous, PRN, Love, Pamela S, PA-C   oxyCODONE-acetaminophen (PERCOCET/ROXICET)  5-325 MG per tablet 1-2 tablet, 1-2 tablet, Oral, Q6H PRN, Love, Pamela S, PA-C, 1 tablet at 05/05/23 1939   pantoprazole (PROTONIX) EC tablet 40 mg, 40 mg, Oral, Q0600, Love, Pamela S, PA-C, 40 mg at 05/06/23 8119   polyethylene glycol (MIRALAX / GLYCOLAX) packet 17 g, 17 g, Oral, Daily PRN, Love, Pamela S, PA-C   [COMPLETED] predniSONE (DELTASONE) tablet 50 mg, 50 mg, Oral, Q breakfast, 50 mg at 05/03/23 1250 **FOLLOWED BY** [COMPLETED] predniSONE (DELTASONE) tablet 40 mg, 40 mg, Oral, Q breakfast, 40 mg at 05/04/23 0930 **FOLLOWED BY** [COMPLETED] predniSONE (DELTASONE) tablet 30 mg, 30 mg, Oral, Q breakfast, 30 mg at 05/05/23 0842 **FOLLOWED BY** [COMPLETED] predniSONE (DELTASONE) tablet 20 mg, 20 mg, Oral, Q breakfast, 20 mg at 05/06/23 0841 **FOLLOWED BY** [START ON 05/07/2023] predniSONE (DELTASONE) tablet 10 mg, 10 mg, Oral, Q breakfast **FOLLOWED BY** [START ON 05/08/2023] predniSONE (DELTASONE) tablet 5 mg, 5 mg, Oral, Q breakfast, Love, Pamela S, PA-C   prochlorperazine (COMPAZINE) tablet 5-10 mg, 5-10 mg, Oral, Q6H PRN **OR** prochlorperazine (COMPAZINE) suppository 12.5 mg, 12.5 mg, Rectal, Q6H PRN **OR** prochlorperazine (COMPAZINE) injection 5-10 mg, 5-10 mg, Intravenous, Q6H PRN, Love, Pamela S, PA-C, 10 mg at 05/06/23 1120   promethazine (PHENERGAN) tablet 25 mg, 25 mg, Oral, Q6H PRN **OR** promethazine (PHENERGAN) suppository 25 mg, 25 mg, Rectal, Q6H PRN, Love, Pamela S, PA-C   senna-docusate (Senokot-S) tablet 2  tablet, 2 tablet, Oral, Q0600, Love, Pamela S, PA-C, 2 tablet at 05/06/23 1478   sodium phosphate (FLEET) enema 1 enema, 1 enema, Rectal, Once PRN, Love, Pamela S, PA-C   sorbitol 70 % solution 60 mL, 60 mL, Oral, Daily PRN, Love, Pamela S, PA-C   traZODone (DESYREL) tablet 50 mg, 50 mg, Oral, QHS PRN, Akintayo, Musa A, MD, 50 mg at 05/05/23 2128  Allergies: Allergies  Allergen Reactions   Bee Venom Anaphylaxis   Toradol [Ketorolac Tromethamine] Shortness Of Breath   Benadryl [Diphenhydramine] Other (See Comments)    States she cannot breathe   Other     CT dye    Karie Fetch, MD, PGY-2

## 2023-05-06 NOTE — Progress Notes (Addendum)
Patient ID: Brittany Mullins, female   DOB: 17-Feb-1981, 43 y.o.   MRN: [id number]  SW returned phone call to pt s/o Durene Cal who was inquiring about what to do if the apartment complex is unable to give her another unit, and when can he return. SW shared will inform medical team on his questions, and will follow-up once there is an update.   *confirmation - 4 sets of stairs that equal a total of 24 stairs in total; 4 stairs into the building; walk up 7, then 6 and then 7 .  Cecile Sheerer, MSW, LCSW Office: [phone] Cell: [phone] Fax: [phone]

## 2023-05-06 NOTE — Progress Notes (Signed)
Occupational Therapy Session Note  Patient Details  Name: Brittany Mullins MRN: [id number] Date of Birth: May 04, 1980  Today's Date: 05/06/2023 OT Individual Time: 4132-4401 OT Individual Time Calculation (min): 11 min  and Today's Date: 05/06/2023 OT Missed Time: 49 Minutes Missed Time Reason: Pain   Short Term Goals: Week 1:  OT Short Term Goal 1 (Week 1): pt will demonstrate improved use of L shoulder to don shirt with min A. OT Short Term Goal 2 (Week 1): Pt will be able to stand with min A to pull pants over hips. OT Short Term Goal 3 (Week 1): pt will be able to safely use RW to ambulate to toilet with mod A.  Skilled Therapeutic Interventions/Progress Updates:  Pt greeted supine in bed, pt reports pain and feeling SOB, Vital assess, O2 98% on RA HR 68 bpm. Pt declined session but did state she need to go to the bathroom. Pt completed supine>sit with CGA. Pt completed stand pivot to [facility] with no AD and CGA. Pt with continent urine void. Pt completed 3/3 toileting tasks with close CGA. Pt declined any further ADLS from [facility] requesting to return to bed to eat lunch. Assisted pt with setting up meals needing assist to open packets and cut cheeseburger in half.         Ended session with pt semi reclined in bed with all needs within reach and bed alarm activated.                    Therapy Documentation Precautions:  Precautions Precautions: Fall Restrictions Weight Bearing Restrictions Per Provider Order: No    Pain: unrated pain reported in head    Therapy/Group: Individual Therapy  Barron Schmid 05/06/2023, 1:17 PM

## 2023-05-06 NOTE — Procedures (Signed)
Patient Name: Brittany Mullins  MRN: [id number]  Epilepsy Attending: Charlsie Quest  Referring Physician/Provider: Marjorie Smolder, NP  Duration: 05/05/2023 1003 to 05/06/2023 1003   Patient history: 43 year old female with a PMHx significant for seizures (on Keppra) and HTN who presents to the emergency department for evaluation of acute onset of right face, arm and leg numbness with weakness. EEG to evaluate for seizure.   Level of alertness: Awake, asleep   AEDs during EEG study: LEV, GBP   Technical aspects: This EEG study was done with scalp electrodes positioned according to the 10-20 International system of electrode placement. Electrical activity was reviewed with band pass filter of 1-70Hz , sensitivity of 7 uV/mm, display speed of 42mm/sec with a 60Hz  notched filter applied as appropriate. EEG data were recorded continuously and digitally stored.  Video monitoring was available and reviewed as appropriate.   Description: The posterior dominant rhythm consists of 9 Hz activity of moderate voltage (25-35 uV) seen predominantly in posterior head regions, symmetric and reactive to eye opening and eye closing. Sleep was characterized by vertex waves, sleep spindles (12 to 14 Hz), maximal frontocentral region. Hyperventilation and photic stimulation were not performed.     Event button was pressed on 05/05/2023 at 1200.  Patient was sitting in bed, eyes open, not responding.  Concomitant EEG before, during and after the episode showed posterior dominant rhythm.   IMPRESSION: This study is within normal limits. No seizures or epileptiform discharges were seen throughout the recording.   Event button was pressed on 05/05/2023 at 1200 during which patient was laying in bed, not responding without concomitant EEG change.  This was a nonepileptic event.   Brittany Mullins

## 2023-05-06 NOTE — Progress Notes (Signed)
Beginning of shift patient remains tearful discussing events that happened earlier in the day. Emotional support provided. PRN trazodone and atarx given and effective. Q1h rounding done. Patient asleep on rounds between 2245 and 0430. No seizure like activity witnessed this shift. Patient voided continent x1 to [facility]. Urine sample obtained. Results pending.

## 2023-05-06 NOTE — Progress Notes (Addendum)
PROGRESS NOTE   Subjective/Complaints:  Pt slept better last night. Still with tearful episodes. Complained to me that dinner orders haven't been right and that nobody's listening to her. EEG active when I came in  ROS: Limited due to cognitive/behavioral   Objective:   CT HEAD WO CONTRAST ( ) Result Date: 05/05/2023 CLINICAL DATA:  Stroke follow-up. EXAM: CT HEAD WITHOUT CONTRAST TECHNIQUE: Contiguous axial images were obtained from the base of the skull through the vertex without intravenous contrast. RADIATION DOSE REDUCTION: This exam was performed according to the departmental dose-optimization program which includes automated exposure control, adjustment of the mA and/or kV according to patient size and/or use of iterative reconstruction technique. COMPARISON:  04/30/2023 head CT FINDINGS: Brain: No evidence of acute infarction, hemorrhage, hydrocephalus, extra-axial collection or mass lesion/mass effect. Stable low-density in the left pons. Vascular: No hyperdense vessel or unexpected calcification. Skull: Normal. Negative for fracture or focal lesion. Sinuses/Orbits: No acute finding. IMPRESSION: No hemorrhage or progression seen at the patient's recent left pontine infarct. No new abnormality. Electronically Signed   By: Tiburcio Pea M.D.   On: 05/05/2023 06:23   EEG adult Result Date: 05/05/2023 Charlsie Quest, MD     05/05/2023 12:06 AM Patient Name: Brittany Mullins MRN: [id number] Epilepsy Attending: Charlsie Quest Referring Physician/Provider: Erick Colace, MD Date: 05/04/2023 Duration: 26.37 mins  Patient history: 43 year old female with a PMHx significant for seizures (on Keppra) and HTN who presents to the emergency department for evaluation of acute onset of right face, arm and leg numbness with weakness. EEG to evaluate for seizure.  Level of alertness: Awake  AEDs during EEG study: LEV, GBP  Technical  aspects: This EEG study was done with scalp electrodes positioned according to the 10-20 International system of electrode placement. Electrical activity was reviewed with band pass filter of 1-70Hz , sensitivity of 7 uV/mm, display speed of 70mm/sec with a 60Hz  notched filter applied as appropriate. EEG data were recorded continuously and digitally stored.  Video monitoring was available and reviewed as appropriate.  Description: The posterior dominant rhythm consists of 9 Hz activity of moderate voltage (25-35 uV) seen predominantly in posterior head regions, symmetric and reactive to eye opening and eye closing. Hyperventilation and photic stimulation were not performed.   EEG was technically difficult due to significant myogenic artifact  IMPRESSION: This technically difficult study is within normal limits. No seizures or epileptiform discharges were seen throughout the recording.  A normal interictal EEG does not exclude the diagnosis of epilepsy.  Charlsie Quest   Recent Labs    05/06/23 0518  WBC 9.2  HGB 11.9*  HCT 36.5  PLT 248   Recent Labs    05/05/23 0810 05/06/23 0518  NA 139 139  K 3.9 4.0  CL 104 105  CO2 27 27  GLUCOSE 75 74  BUN 25* 22*  CREATININE 0.80 0.77  CALCIUM 9.1 8.8*    Intake/Output Summary (Last 24 hours) at 05/06/2023 0953 Last data filed at 05/06/2023 0458 Gross per 24 hour  Intake 240 ml  Output 600 ml  Net -360 ml        Physical Exam: Vital Signs  Blood pressure (!) 131/91, pulse (!) 56, temperature 97.7 F (36.5 C), temperature source Oral, resp. rate 16, height 5\' 2"  (1.575 m), weight 70.3 kg, SpO2 98%.   Constitutional: No distress . Vital signs reviewed. Eeg electrodes in place HEENT: NCAT, EOMI, oral membranes moist Neck: supple Cardiovascular: RRR without murmur. No JVD    Respiratory/Chest: CTA Bilaterally without wheezes or rales. Normal effort    GI/Abdomen: BS +, non-tender, non-distended Ext: no clubbing, cyanosis, or edema Psych:  anxious but cooperative. Distracted at times  Skin: No evidence of breakdown, no evidence of rash Neurologic: Cranial nerves II through XII intact, motor strength is 3- /5 in bilateral deltoid, bicep, tricep,RIght  grip, 5/5 Left grip, 5/5 left and 4/5 right hip flexor, knee extensors, ankle dorsiflexor and plantar flexor Some pain inhibition LUE  Sensory examreduced LT sensation RIght 4th and 5th digit, left shoulder  Cerebellar exam limited by strength on right and pain on left  Musculoskeletal: Full range of motion in all 4 extremities. No joint swelling Moving Left shoulder better Trigger point Left upper trap   Assessment/Plan: 1. Functional deficits which require 3+ hours per day of interdisciplinary therapy in a comprehensive inpatient rehab setting. Physiatrist is providing close team supervision and 24 hour management of active medical problems listed below. Physiatrist and rehab team continue to assess barriers to discharge/monitor patient progress toward functional and medical goals  Care Tool:  Bathing    Body parts bathed by patient: Abdomen, Chest, Front perineal area, Right upper leg, Left upper leg, Left lower leg, Face, Left arm, Right arm   Body parts bathed by helper: Right lower leg, Buttocks, Right arm, Left arm     Bathing assist Assist Level: Minimal Assistance - Patient > 75%     Upper Body Dressing/Undressing Upper body dressing   What is the patient wearing?: Bra, Pull over shirt    Upper body assist Assist Level: Moderate Assistance - Patient 50 - 74%    Lower Body Dressing/Undressing Lower body dressing      What is the patient wearing?: Underwear/pull up, Pants     Lower body assist Assist for lower body dressing: Contact Guard/Touching assist     Toileting Toileting    Toileting assist Assist for toileting: Contact Guard/Touching assist     Transfers Chair/bed transfer  Transfers assist     Chair/bed transfer assist level: Moderate  Assistance - Patient 50 - 74%     Locomotion Ambulation   Ambulation assist      Assist level: Moderate Assistance - Patient 50 - 74% Assistive device: No Device Max distance: 10   Walk 10 feet activity   Assist     Assist level: Moderate Assistance - Patient - 50 - 74% Assistive device: No Device   Walk 50 feet activity   Assist Walk 50 feet with 2 turns activity did not occur: Safety/medical concerns         Walk 150 feet activity   Assist Walk 150 feet activity did not occur: Safety/medical concerns         Walk 10 feet on uneven surface  activity   Assist Walk 10 feet on uneven surfaces activity did not occur: Safety/medical concerns         Wheelchair     Assist Is the patient using a wheelchair?: Yes Type of Wheelchair: Manual      Max wheelchair distance: 50    Wheelchair 50 feet with 2 turns activity    Assist  Assist Level: Minimal Assistance - Patient > 75%   Wheelchair 150 feet activity     Assist      Assist Level: Maximal Assistance - Patient 25 - 49%   Blood pressure (!) 131/91, pulse (!) 56, temperature 97.7 F (36.5 C), temperature source Oral, resp. rate 16, height 5\' 2"  (1.575 m), weight 70.3 kg, SpO2 98%.  Medical Problem List and Plan: 1. Functional deficits secondary to Lft ventral pontine CVA, likely small vessel disease             -patient may shower             -ELOS/Goals: 10-14 days,  Mod I to Sup PT/OT/SLP            -Continue CIR therapies including PT, OT --therapies today once EEG ended 2.  Antithrombotics: -DVT/anticoagulation:  Pharmaceutical: Lovenox             -antiplatelet therapy: DAPT X 3 weeks followed by ASA alone.  3. Pain Management: On Methadone (followed by [facility]).             --now on prn oxycodone due to shoulder/arm pain             --gabapentin TID for neuropathy 4. H/o bipolar d/o/Mood/Behavior/Sleep: LCSW to follow for evaluation and support.               -2/3 psych recommended abilify 5mg  q am and lamictal 25mg  bid   -abilify begins today, lamictal started yesterday   -seems to be a bit more settled today   -continue to provide egosupport             -antipsychotic agents: N/A--has not been on any psych meds for years.  5. Neuropsych/cognition: This patient is capable of making decisions on her own behalf. 6. Skin/Wound Care: Routine pressure relief measures. 7. Fluids/Electrolytes/Nutrition: Monitor I/O.    2/3 potassium 4.0, continue daily supplement     -bun a little better. Continue to encourage fluids 8. HTN: Monitor BP TID--continue Cozaar and Norvasc.    Bp's currently tolerable 9. Seizure d/o: Now on Keppra TID 10. Addison's disease: Now on Cortef 10 mg am/5 mg pm 11. Chronic pancreatitis: Has chronic issues with nausea --continue phenergan --continue PPI 13. Constipation: Reports rectal bleeding-->colace added to senna due to hx of hems.   14. Left arm shoulder/elbow pain             -Xray elbow/shoulder 1/28 negative Suspect myofascial pain +/- frozen shoulder - improved shoulder ROM, still with upper trap trigger point -   2/3 pt received trigger point injection yesterday. ?improvement--was focused on other issues today 15. Marijuana use             -Advise cessation, associated with increased stroke risk 26-44% depending on study   -2/3 uds + yesterday for thc, benzos. Urine drug profile sent to [facility], pending 16. Hx of pituitary tumor             -f/u Neurosurgery 17. Low protein S             -f/u Hematology outpatient, will need retesting in ~61mo  18.  Hx sensorineural hearing loss with resultant dysarthria  19.  LUE numbness C5 distribution shoulder pain , ? Proximal weakness vs pain inhibition , only mild C4-5 stenosis on MRI  , MRI reviewed with Neuroradiology , no spinal cord signal abnormalities Neurosurg, Dr Dawley looked at MRI , C spine looks stable  Still has subjective  numbess in left shoulder   20.   Insomnia- likely multifactorial, anxiety regarding medical condition, underlying anxiety d/o and on higher dose steroid taper - expect some improvement with steroid taper ,  - hydroxyzine for anxiety  -did sleep last night 21.  Seizure like episode,  Prior hx but not well documented in medical record, recent Left pontine infarct (lower incidence of seizure for this anatomic location vs cortical) also with empty sella syndrome with diagnosis of addison's disease , already on moderate dose Keppra-   -CT of head without changes  -EEG without sz activity so far  -continue LTM EEG per neurology. Appreciate their help  LOS: 5 days A FACE TO FACE EVALUATION WAS PERFORMED  Ranelle Oyster 05/06/2023, 9:53 AM    7

## 2023-05-06 NOTE — Progress Notes (Signed)
LTM maint complete - no skin breakdown under:  fp1, a2, cz.

## 2023-05-07 DIAGNOSIS — G894 Chronic pain syndrome: Secondary | ICD-10-CM | POA: Diagnosis not present

## 2023-05-07 DIAGNOSIS — I639 Cerebral infarction, unspecified: Secondary | ICD-10-CM | POA: Diagnosis not present

## 2023-05-07 DIAGNOSIS — F445 Conversion disorder with seizures or convulsions: Secondary | ICD-10-CM | POA: Diagnosis not present

## 2023-05-07 DIAGNOSIS — Z8659 Personal history of other mental and behavioral disorders: Secondary | ICD-10-CM

## 2023-05-07 DIAGNOSIS — R569 Unspecified convulsions: Secondary | ICD-10-CM | POA: Diagnosis not present

## 2023-05-07 MED ORDER — OXYCODONE-ACETAMINOPHEN 5-325 MG PO TABS
1.0000 | ORAL_TABLET | Freq: Four times a day (QID) | ORAL | Status: DC | PRN
  Administered 2023-05-07 – 2023-05-13 (×13): 1 via ORAL
  Filled 2023-05-07 (×18): qty 1

## 2023-05-07 MED ORDER — LEVONORGESTREL-ETHINYL ESTRAD 0.1-20 MG-MCG PO TABS
1.0000 | ORAL_TABLET | Freq: Every day | ORAL | Status: DC
  Administered 2023-05-07 – 2023-05-18 (×12): 1 via ORAL

## 2023-05-07 NOTE — Consult Note (Addendum)
 [facility] Psychiatric Consult Follow-up  Patient Name: .Brittany Mullins  MRN: [id number]  DOB: Aug 28, 1980  Consult Order details:  Orders (From admission, onward)     Start     Ordered   05/05/23 0925  IP CONSULT TO PSYCHIATRY       Ordering Provider: Carilyn Prentice BRAVO, MD  Provider:  (Not yet assigned)  Question Answer Comment  Location [facility]   Reason for Consult? BPD      05/05/23 0924            Mode of Visit: In person   Psychiatry Consult Evaluation  Service Date: May 07, 2023 LOS:  LOS: 6 days  Chief Complaint I haven't slept in 4 days and I have racing thoughts and mood swing.   Primary Psychiatric Diagnoses  Bipolar I disorder with mixed features.  2.  Cannabis induced mood disorder.   Assessment  Brittany Mullins is a 43 y.o. female admitted: Medicallyfor 05/01/2023  5:12 PM for left pontine stroke. She carries the psychiatric diagnoses of Bipolar disorder and Cannabis use disorder and has a past medical history of Addison's disease, PCOS, pancreatitis, seizures, and chronic pain on methadone .  Her initial presentation of decreased need for sleep, racing thoughts, distractibility, mood swing, high energy level, feeling overwhelmed, lack of motivation, crying spells, and problem concentration is most consistent with  Bipolar I disorder with mixed features. She does not meets criteria for inpatient admission  based on absent suicidal or homicidal ideation, intent or plan.  Patient is not currently on any psychotropic medications. She was previously prescribed Risperidone (unknown dose) for Bipolar disorder but stopped years ago due to excessive weight gain. She was not compliant with medications prior to admission as evidenced by patient reports. She was started on abilify , lamictal , and gabapentin . She reported increased somnolence, so gabapentin  was decreased. This improved her somnolence. She continued to deny SI/HI.  Collateral contacted who also did not have any acute safety concerns. Please see plan below for detailed recommendations.   Diagnoses:  Active Hospital problems: Principal Problem:   Left pontine stroke [facility]) Active Problems:   Bipolar I disorder with mixed features (HCC)    Plan   ## Psychiatric Medication Recommendations:  --Continue Abilify  5 mg daily for manic episode-due to low risk of reducing seizure threshold --Continue Lamotrigine  25 mg twice daily and up titrate as needed-For mood stabilization and for seizure prophylaxis. Lamotrigine  also has less risk of weight gain compared to other mood stabilizers e.g Depakote. --Continue Gabapentin  200 mg TID for neuropathic pain/anxiety  --Continue Trazodone  to 50 mg at bedtime for sleep.   ## Medical Decision Making Capacity: Not specifically addressed in this encounter  ## Further Work-up:  -- None at this time. -- most recent EKG on 04/24/2023 had QtC of 499 -- Pertinent labwork reviewed earlier this admission includes: Hb-11.7, THC-+ve,    ## Disposition:-- There are no psychiatric contraindications to discharge at this time  ## Behavioral / Environmental: -Delirium Precautions: Delirium Interventions for Nursing and Staff: - RN to open blinds every AM. - To Bedside: Glasses, hearing aide, and pt's own shoes. Make available to patients. when possible and encourage use. - Encourage po fluids when appropriate, keep fluids within reach. - OOB to chair with meals. - Passive ROM exercises to all extremities with AM & PM care. - RN to assess orientation to person, time and place QAM and PRN. - Recommend extended visitation hours with familiar family/friends as feasible. - Staff  to minimize disturbances at night. Turn off television when pt asleep or when not in use.    ## Safety and Observation Level:  - Based on my clinical evaluation, I estimate the patient to be at low risk of self harm in the current setting. - At this time, we  recommend  routine. This decision is based on my review of the chart including patient's history and current presentation, interview of the patient, mental status examination, and consideration of suicide risk including evaluating suicidal ideation, plan, intent, suicidal or self-harm behaviors, risk factors, and protective factors. This judgment is based on our ability to directly address suicide risk, implement suicide prevention strategies, and develop a safety plan while the patient is in the clinical setting. Please contact our team if there is a concern that risk level has changed.  CSSR Risk Category:C-SSRS RISK CATEGORY: No Risk  Suicide Risk Assessment: Patient has following modifiable risk factors for suicide: medication noncompliance, which we are addressing by prescribing medications. Patient has following non-modifiable or demographic risk factors for suicide: psychiatric hospitalization Patient has the following protective factors against suicide: Supportive family and Pets in the home  Thank you for this consult request. Recommendations have been communicated to the primary team.  We will sign off at this time.   Brittany Minor, MD, PGY-2       History of Present Illness  Relevant Aspects of [facility] Course:  Admitted on 05/01/2023 for stroke workup. Patient is 32 tear old female with Addison's disease, PCOS, pancreatitis with chronic N/V, seizures, PUD/duodenitis, chronic pain (abdominal due to pancreatitis)- on methadone , bipolar d/o, marijuana use  who was admitted on 04/22/23 with reports of right sided numbness that started day before as well as reports of abnormal gait with complaints of left calf pain per family.  UDS positive for THC. CTA head/neck was negative for LVO. MRI brain done revealing acute paramedian left pontine infarct and ultrasound BLE dopplers were negative for DVT. She was noted to have slurred speech with intermittent aphasia and recommended DAPT X 3  weeks for stroke felt to be due to small vessel disease.   ON events:  BP elevated. Compliant with meds. Received PRN ativan  and oxy this AM. Had an episode this AM stating she felt funny, was noted to be shaky and anxious, ativan  given for seizure-like activity.    Patient Report:  Patient seen in her room. She is a little somnolent after receiving ativan  for her seizure-like activity this AM. She notes in general she felt less groggy with the decrease in gabapentin . She reports she slept well. She reports she ate eggs and muffin for breakfast this AM. She reports her mood is alright. Gives permission to contact fiance who she has been talking to. Collateral contacted who reported no acute safety concerns for patient, notes that she has some ups and downs related to being less independent but not reporting symptoms consistent with manic or depressive episode currently.   Psych ROS (per initial consult):  Depression:patient reports crying spells, lack of motivation, feeling overwhelmed, and problem concentrating.  Anxiety: patient reports being apprehensive, nervous, irritable with poor concentration.  Mania (lifetime and current): She reports mood swing, decreased need for sleep, distractibility, racing thoughts, and having excessive energy level.  Psychosis: (lifetime and current): patient denies delusions, hallucinations, perceptual abnormality, or idea of reference.   Collateral information:  Contacted fiance [name] [phone]  Review of Systems  Constitutional:  Positive for malaise/fatigue.  Gastrointestinal: Negative.   Psychiatric/Behavioral:  Negative for depression and suicidal ideas.      Psychiatric and Social History  Psychiatric History:  Information collected from patient  Prev Dx/Sx: Bipolar disorder Current Psych Provider: None  Home Meds (current): None  Previous Med Trials: Risperidone (excess weight gain) and ?Clonidine  (unknown dose) Therapy: denies   Prior  Psych Hospitalization: yes, was admitted at [facility] for manic episode in 2012.   Prior Self Harm: denies  Prior Violence: denies   Family Psych History: Mother and father were diagnosed with Bipolar disorder. Patient's son has history of PTSD and Narcolepsy.  Family Hx suicide: denies   Social History:  Educational Hx: high school graduate Occupational Hx: unknown  Legal Hx: patient denies  Living Situation: lives with her boyfriend and her service dog Spiritual Hx: unknwon  Access to weapons/lethal means: denies    Substance History Alcohol : denies   Type of alcohol  N/A Last Drink N/A Number of drinks per day N/A History of alcohol  withdrawal seizures N/A History of DT's N/A Tobacco: N/A Illicit drugs: CBD gummy and prescription opioid abuse (on methadone  treatment at [facility]) Prescription drug abuse: opioid  Rehab hx: denies   Exam Findings  Vital Signs:  Temp:  [97.8 F (36.6 C)-98.4 F (36.9 C)] 98.4 F (36.9 C) (02/04 0346) Pulse Rate:  [62-74] 71 (02/04 0346) Resp:  [17-19] 17 (02/04 0346) BP: (119-142)/(71-91) 142/86 (02/04 0346) SpO2:  [97 %-99 %] 97 % (02/04 0346) Blood pressure (!) 142/86, pulse 71, temperature 98.4 F (36.9 C), resp. rate 17, height 5' 2 (1.575 m), weight 70.3 kg, SpO2 97%. Body mass index is 28.35 kg/m.  Physical Exam HENT:     Head: Normocephalic.     Comments: EEG leads in place Pulmonary:     Effort: Pulmonary effort is normal.  Neurological:     Mental Status: She is lethargic.     Mental Status Exam: General Appearance: Casual  Orientation:  NA  Memory:  Immediate;   Fair  Concentration:  Concentration: Fair  Recall:  Fair  Attention  Poor  Eye Contact:  Fair  Speech:  Normal Rate  Language:  Good  Volume:  Decreased  Mood: feel a little better  Affect:  Flat  Thought Process:  Goal Directed  Thought Content:  Logical  Suicidal Thoughts:  No  Homicidal Thoughts:  No  Judgement:  Fair  Insight:  Fair   Psychomotor Activity:  Decreased  Akathisia:  No  Fund of Knowledge:  Fair     Assets:  Engineer, Maintenance Social Support  Cognition:  WNL  ADL's:  Impaired  AIMS (if indicated):        Other History   These have been pulled in through the EMR, reviewed, and updated if appropriate.  Family History:  The patient's family history includes Cancer in her mother; Diabetes in her maternal grandfather and maternal grandmother; Hypertension in her mother.  Medical History: Past Medical History:  Diagnosis Date   Addison's disease [facility])    Brain tumor (benign) (HCC)    Chronic pancreatitis (HCC)    Hearing loss    Hypertension    Marijuana use    Per pt: medical marijuana patient   Pancreatitis    PCOS (polycystic ovarian syndrome)    Pituitary tumor    Seizures (HCC)     Surgical History: Past Surgical History:  Procedure Laterality Date   ABDOMINAL HYSTERECTOMY     APPENDECTOMY     BIOPSY  08/16/2021   Procedure: BIOPSY;  Surgeon: San Sandor GAILS, DO;  Location: [facility];  Service: Gastroenterology;;   CESAREAN SECTION     CHOLECYSTECTOMY     ELBOW SURGERY     ESOPHAGOGASTRODUODENOSCOPY (EGD) WITH PROPOFOL  N/A 08/16/2021   Procedure: ESOPHAGOGASTRODUODENOSCOPY (EGD) WITH PROPOFOL ;  Surgeon: San Sandor GAILS, DO;  Location: [facility] ENDOSCOPY;  Service: Gastroenterology;  Laterality: N/A;   kidney stent     pancreatic stent     RADIOLOGY WITH ANESTHESIA N/A 04/25/2023   Procedure: MRI of Brain without contrast;  Surgeon: Radiologist, Medication, MD;  Location: [facility] OR;  Service: Radiology;  Laterality: N/A;   RADIOLOGY WITH ANESTHESIA N/A 05/03/2023   Procedure: MRI WITH ANESTHESIA CERVICAL SPINE WITHOUT CONTRAST;  Surgeon: Radiologist, Medication, MD;  Location: [facility] OR;  Service: Radiology;  Laterality: N/A;     Medications:   Current Facility-Administered Medications:    acetaminophen  (TYLENOL ) tablet 325-650 mg, 325-650 mg, Oral, Q4H PRN, Love, Pamela S,  PA-C, 650 mg at 05/04/23 0250   albuterol  (PROVENTIL ) (2.5 MG/3ML) 0.083% nebulizer solution 2.5 mg, 2.5 mg, Nebulization, Q2H PRN, Love, Pamela S, PA-C   alum & mag hydroxide-simeth (MAALOX/MYLANTA) 200-200-20 MG/5ML suspension 30 mL, 30 mL, Oral, Q4H PRN, Love, Pamela S, PA-C   amLODipine  (NORVASC ) tablet 5 mg, 5 mg, Oral, Daily, Love, Pamela S, PA-C, 5 mg at 05/06/23 9158   ARIPiprazole  (ABILIFY ) tablet 5 mg, 5 mg, Oral, Daily, Kirsteins, Prentice BRAVO, MD, 5 mg at 05/06/23 0841   aspirin  EC tablet 81 mg, 81 mg, Oral, Daily, Love, Pamela S, PA-C, 81 mg at 05/06/23 9157   atorvastatin  (LIPITOR) tablet 40 mg, 40 mg, Oral, Daily, Love, Pamela S, PA-C, 40 mg at 05/06/23 9158   bisacodyl  (DULCOLAX) suppository 10 mg, 10 mg, Rectal, Daily PRN, Love, Pamela S, PA-C   blood pressure control book, , Does not apply, Once, Kirsteins, Prentice BRAVO, MD   capsaicin  (ZOSTRIX) 0.025 % cream, , Topical, BID, Kirsteins, Prentice BRAVO, MD, Given at 05/06/23 2134   clopidogrel  (PLAVIX ) tablet 75 mg, 75 mg, Oral, Daily, Love, Pamela S, PA-C, 75 mg at 05/06/23 9158   cyclobenzaprine  (FLEXERIL ) tablet 5 mg, 5 mg, Oral, TID PRN, Love, Pamela S, PA-C, 5 mg at 05/04/23 0250   enoxaparin  (LOVENOX ) injection 40 mg, 40 mg, Subcutaneous, Q24H, Love, Pamela S, PA-C, 40 mg at 05/06/23 1826   gabapentin  (NEURONTIN ) capsule 200 mg, 200 mg, Oral, TID, Shriyan Arakawa, MD, 200 mg at 05/06/23 2133   guaiFENesin -dextromethorphan (ROBITUSSIN DM) 100-10 MG/5ML syrup 5-10 mL, 5-10 mL, Oral, Q6H PRN, Love, Pamela S, PA-C   hydrocortisone  (CORTEF ) tablet 5 mg, 5 mg, Oral, QAC supper, 5 mg at 05/06/23 1715 **AND** hydrocortisone  (CORTEF ) tablet 10 mg, 10 mg, Oral, Q breakfast, Love, Pamela S, PA-C, 10 mg at 05/06/23 9158   hydrOXYzine  (ATARAX ) tablet 10 mg, 10 mg, Oral, TID PRN, Carilyn Prentice BRAVO, MD, 10 mg at 05/06/23 1258   lamoTRIgine  (LAMICTAL ) tablet 25 mg, 25 mg, Oral, BID, Kirsteins, Prentice BRAVO, MD, 25 mg at 05/06/23 2134   levETIRAcetam   (KEPPRA ) tablet 750 mg, 750 mg, Oral, Q8H, Love, Pamela S, PA-C, 750 mg at 05/07/23 0544   LORazepam  (ATIVAN ) injection 2 mg, 2 mg, Intravenous, Q6H PRN, de Clint Kill, Cortney E, NP, 2 mg at 05/07/23 9362   losartan  (COZAAR ) tablet 25 mg, 25 mg, Oral, Daily, Love, Pamela S, PA-C, 25 mg at 05/06/23 9158   methadone  (DOLOPHINE ) tablet 120 mg, 120 mg, Oral, Daily, Love, Pamela S, PA-C, 120 mg at 05/06/23 [phone]  naloxone  (NARCAN ) injection 0.4 mg, 0.4 mg, Intravenous, PRN, Love, Pamela S, PA-C   oxyCODONE -acetaminophen  (PERCOCET/ROXICET) 5-325 MG per tablet 1-2 tablet, 1-2 tablet, Oral, Q6H PRN, Love, Pamela S, PA-C, 2 tablet at 05/07/23 9571   pantoprazole  (PROTONIX ) EC tablet 40 mg, 40 mg, Oral, Q0600, Love, Pamela S, PA-C, 40 mg at 05/07/23 0543   polyethylene glycol (MIRALAX  / GLYCOLAX ) packet 17 g, 17 g, Oral, Daily PRN, Love, Pamela S, PA-C   [COMPLETED] predniSONE  (DELTASONE ) tablet 50 mg, 50 mg, Oral, Q breakfast, 50 mg at 05/03/23 1250 **FOLLOWED BY** [COMPLETED] predniSONE  (DELTASONE ) tablet 40 mg, 40 mg, Oral, Q breakfast, 40 mg at 05/04/23 0930 **FOLLOWED BY** [COMPLETED] predniSONE  (DELTASONE ) tablet 30 mg, 30 mg, Oral, Q breakfast, 30 mg at 05/05/23 0842 **FOLLOWED BY** [COMPLETED] predniSONE  (DELTASONE ) tablet 20 mg, 20 mg, Oral, Q breakfast, 20 mg at 05/06/23 0841 **FOLLOWED BY** predniSONE  (DELTASONE ) tablet 10 mg, 10 mg, Oral, Q breakfast **FOLLOWED BY** [START ON 05/08/2023] predniSONE  (DELTASONE ) tablet 5 mg, 5 mg, Oral, Q breakfast, Love, Pamela S, PA-C   prochlorperazine  (COMPAZINE ) tablet 5-10 mg, 5-10 mg, Oral, Q6H PRN **OR** prochlorperazine  (COMPAZINE ) suppository 12.5 mg, 12.5 mg, Rectal, Q6H PRN **OR** prochlorperazine  (COMPAZINE ) injection 5-10 mg, 5-10 mg, Intravenous, Q6H PRN, Love, Pamela S, PA-C, 10 mg at 05/06/23 1120   promethazine  (PHENERGAN ) tablet 25 mg, 25 mg, Oral, Q6H PRN **OR** promethazine  (PHENERGAN ) suppository 25 mg, 25 mg, Rectal, Q6H PRN, Love, Pamela S, PA-C    senna-docusate (Senokot-S) tablet 2 tablet, 2 tablet, Oral, Q0600, Love, Pamela S, PA-C, 2 tablet at 05/07/23 0543   sodium phosphate  (FLEET) enema 1 enema, 1 enema, Rectal, Once PRN, Love, Pamela S, PA-C   sorbitol  70 % solution 60 mL, 60 mL, Oral, Daily PRN, Love, Pamela S, PA-C   traZODone  (DESYREL ) tablet 50 mg, 50 mg, Oral, QHS PRN, Akintayo, Musa A, MD, 50 mg at 05/05/23 2128  Allergies: Allergies  Allergen Reactions   Bee Venom Anaphylaxis   Toradol [Ketorolac Tromethamine] Shortness Of Breath   Benadryl  [Diphenhydramine ] Other (See Comments)    States she cannot breathe   Other     CT dye    Brittany Minor, MD, PGY-2

## 2023-05-07 NOTE — Progress Notes (Signed)
 Speech Language Pathology Daily Session Note  Patient Details  Name: Brittany Mullins MRN: [id number] Date of Birth: 10-24-1980  Today's Date: 05/07/2023 SLP Individual Time: 0915-1001 SLP Individual Time Calculation (min): 46 min  Short Term Goals: Week 1: SLP Short Term Goal 1 (Week 1): Patient will participate in a MBS to assess oropharyngeal swallowing function SLP Short Term Goal 2 (Week 1): Patient will increase speech intelligibility to 100% at the sentence level given min multimodal A SLP Short Term Goal 3 (Week 1): Patient will demonstrate problem solving abilities in mildly complex daily situations given min multimodal A SLP Short Term Goal 4 (Week 1): Patient will recall and utilize memory strategies given min multimodal A SLP Short Term Goal 5 (Week 1): Patient will sustain attention to tasks for 10 minutes at a time with min multimodal A  Skilled Therapeutic Interventions: Initial 15 minutes of session missed as patient wrapping up completion of EEG. Skilled SLP session then focused on communication and cognitive goals. SLP completed EMST (expiratory muscle strength training) testing for MEP (maximum expiratory pressure). Patient with an average of ~30 cm H2O indicating significantly reduced lung strength, likely impacting communication and cough efficency. SLP provided patient with EMST 75 device and initiated use at 20cm H20. Patient completed approximately 15 repetitions. At the end of the session, SLP targeted cognitive goals. SLP encouraged patient to create budget for vacation task. Patient chose to visit [facility] for a weekend trip in July. SLP prompted patient to list necessary budgeting items including hotel stay, transportation and food. Patient listed items with mod i A and approximated budget at $800. Plan to complete remainder of activity next session. Patient left in bed with alarm set and call bell in reach. Continue POC.    Pain Denies  Therapy/Group:  Individual Therapy  Jonta Gastineau M.A., CF-SLP 05/07/2023, 7:41 AM

## 2023-05-07 NOTE — Consult Note (Signed)
 Neuropsychological Consultation Comprehensive Inpatient Rehab   Patient:   Brittany Mullins   DOB:   10/04/1980  MR Number:  [id number]  Location:  [facility] [address] [place] [address] Dept: [phone] Loc: [phone]           Date of Service:   05/07/2023  Start Time:   3 PM End Time:   4 PM  Provider/Observer:  Norleen Asa, Psy.D.       Clinical Neuropsychologist       Billing Code/Service: [id number]  Reason for Service:    Brittany Mullins is a 43 year old female referred for neuropsychological consultation during her ongoing admission to the comprehensive inpatient rehabilitation unit.  Patient has had a recent left pontine stroke impacting right sided motor movements and slurred speech with motor base expressive language changes.  Patient has a past medical history including Addison's disease, brain tumor, PCOS, pancreatitis with chronic nausea vomiting and significant pain difficulties, PUD and maintenance dose of methadone  through pain clinic due to her severe pain associated with her pancreatitis.  Patient has a past medical history including diagnosis of bipolar disorder.  Patient has been having some psychiatric control of previously diagnosed bipolar disorder with risperidone but reportedly stopped taking it years ago due to weight gain.  Patient had psychiatric consultation during her current admission as she began developing decreased need for sleep, racing thoughts, distractibility, mood swings and high energy level.  Patient reported feeling overwhelmed, changes in mood and motivation, crying spells and problems with concentration.  Psychiatry felt this was consistent with bipolar 1 disorder with mixed features and started around psychotropic medications including Abilify , Lamictal  and gabapentin .  Patient has been treated with Ativan  or other benzodiazepines type  medications in the past.  Along with the psychotropic medications the patient has also been started on Keppra  after having a witnessed seizure-like event.  The patient was admitted on 04/22/2023 after reporting right sided numbness that it started the day before.  There was also apparent abnormal gait with complaints of left calf pain.  Urine drug screen was positive for THC.  Patient reported today that she has been using CBD but more in-depth discussion with Like she was vaping delta 9 which converts to [facility] directly with vaping.  While it is technically illegal in [place]  it is still a THC products.  Patient reports that her physicians have been aware of her use of this type of product to help manage her pain.  I did address today the potential risk of THC products with vascular systems in the brain.  MRI has identified left pontine infarct.  During the clinical visit today, the patient was initially rather sedated and groggy and noted that she felt it was related to the number of medications she is taking.  This would be consistent with a combination of Lamictal , Keppra , Abilify  and gabapentin  all being on board.  However, she became more oriented and alert this Tylenol .  Patient acknowledged her past history but felt that her mood has been stable for the most part and she has been dealing with so much chronic pain and difficulties her medical status really overwhelmed any mood state changes.  Patient has been on long-term opiate prophylactic care for severe pain and has been going to a pain clinic in receiving methadone .  Patient was aware that she did have a stroke but unable to provide any in-depth details around that.  Patient acknowledged changes in motor function for right side and continued to appear to have somewhat of a right-sided facial droop.  She was, however, able to move both her right arm/fingers and right leg/toes but it was clear that she did not have full control or strength in the  limbs.  Grip strength 4 right hand with significant left grip strength for left hand.  Patient reported that her mood has been more stable recently acknowledge some disturbance in mood state initially during her hospital stay.  The patient stated she felt comfortable with the current medication regimen and notes that she needs to focus on the rehab efforts.  Patient noted that if she has had an improvement in sleep overall.  While the patient was quite polite and engaged she did acknowledge that she has had some distress and interpretation of staff is being rude etc.  It did appear that she was gaining from inside about outcome of her behavioral responses particularly during work possibly with a hypomanic episode played a role in some of the perceptions of the weight people are engaged with them.  Patient has previously complained about the discontinued Ativan  was not initiated today that she brought up.  HPI for the current admission:    HPI:  Brittany Mullins is a 43 year old RH- female with Addison's disease, PCOS, pancreatitis with chronic N/V, seizures, PUD/duodenitis, chronic pain (abdominal due to pancreatitis)- on methadone , bipolar d/o, marijuana use  who was admitted on 04/22/23 with reports of right sided numbness that started day before as well as reports of abnormal gait with complaints of left calf pain per family.  UDS positive for THC. CTA head/neck was negative for LVO. MRI brain done revealing acute paramedian left pontine infarct and ultrasound BLE dopplers were negative for DVT. She was noted to have slurred speech with intermittent aphasia and recommended DAPT X 3 weeks for stroke felt to be due to small vessel disease.    She has had issues with recurrent nausea as well as headaches. Orthostasis with hyperkalemia at admission treated with IVF. Patient without PCP and had stopped taking her prednisone  (didn't like how it made her feel) and question recent neurological follow up. Dr.  Georjean recommended by Dr. Jerri. She was started on hydrocortisone  10 mg am/ 5 mg pm. Has required IV morphine  for HA as well as IV dilaudid  for post auricular pain with induration treated with warm compresses.  X ray of soft tissue neck done and was negative for mass Oxycodone  added to help manage pain.     She also reported Left shoulder and left elbow pain,  X rays ordered 01/28  and were negative for subluxation, fracture or dislocation. She reports prior dislocations of her L shoulder in the past.  She reported increase in dysesthesias on 01/27 and CT head repeated which was negative for extension or hemorrhage. Neurology reconsulted as boyfriend reported 2 staring episodes with brief episodes of unresponsiveness followed by stiffness LUE 01/27 am question due to seizure. LT- EEG ordered which was negative for seizures and Keppra  increased to 750 mg TID per Dr. Rosemarie.    Low protein S noted. Neurology recommending referral to hematology.    Therapy has been working with patient who is limited by pain LUE, ataxic gait with decreased weight shifting to the right, right blurry vision,  right sided weakness and is requiring min to mod assist overall. She was independent PTA. CIR recommended due to functional decline.   Medical History:  Past Medical History:  Diagnosis Date   Addison's disease [facility])    Brain tumor (benign) (HCC)    Chronic pancreatitis (HCC)    Hearing loss    Hypertension    Marijuana use    Per pt: medical marijuana patient   Pancreatitis    PCOS (polycystic ovarian syndrome)    Pituitary tumor    Seizures (HCC)          Patient Active Problem List   Diagnosis Date Noted   History of bipolar disorder 05/07/2023   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   HTN (hypertension) 04/23/2023   Stroke (cerebrum) (HCC) 04/22/2023   Hyperkalemia 04/22/2023   Dizziness 04/22/2023   Acute right-sided weakness 04/22/2023   Acute intractable headache 04/22/2023   Intractable  vomiting with nausea 08/17/2022   Abdominal pain, epigastric    Gastritis and gastroduodenitis    AKI (acute kidney injury) (HCC) 08/15/2021   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   Intractable nausea and vomiting 10/25/2019   History of Addison's disease    Intractable cyclical vomiting with nausea    Chronic pain disorder 06/03/2017   Opioid dependence (HCC) 06/03/2017   Hypokalemia 06/03/2017   Dehydration 06/03/2017   Generalized abdominal pain    Hypomagnesemia    Marijuana use    Chronic pancreatitis (HCC) 01/21/2014   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   Bipolar I disorder with mixed features (HCC) 07/07/2012    Behavioral Observation/Mental Status:   Brittany Mullins  presents as a 43 y.o.-year-old Right handed Caucasian Female who appeared her stated age. her dress was Appropriate and she was Well Groomed and her manners were Appropriate to the situation.  her participation was indicative of Appropriate and Redirectable behaviors.  There were physical disabilities noted.  she displayed an appropriate level of cooperation and motivation.    Interactions:    Active Appropriate  Attention:   abnormal and attention span appeared shorter than expected for age  Memory:   abnormal; remote memory intact, recent memory impaired  Visuo-spatial:   not examined  Speech (Volume):  low  Speech:   normal; slurred patient's expressive aphasia appears to be primary motor with no true word finding errors or paraphasic errors.  Thought Process:  Freight Forwarder and Coherent  Though Content:  WNL; not suicidal and not homicidal  Orientation:   person, place, and situation  Judgment:   Fair  Planning:   Poor  Affect:    Blunted and Lethargic  Mood:    Dysphoric  Insight:   Fair  Intelligence:   normal  Psychiatric History:  Patient with past psychiatric history including previous diagnosis of bipolar disorder and recent psychiatric consultation making a  diagnosis of bipolar 1 disorder and starting mood stabilizing medications.  History of Substance Use or Abuse:    Patient with long-term prophylactic use of opiates currently in the form of methadone  and attending methadone  clinic for her pain medication.   Family Med/Psych History:  Family History  Problem Relation Age of Onset   Cancer Mother    Hypertension Mother    Diabetes Maternal Grandmother    Diabetes Maternal Grandfather    Impression/DX:   Brittany Mullins is a 43 year old female referred for neuropsychological consultation during her ongoing admission to the comprehensive inpatient rehabilitation unit.  Patient has had a recent left pontine stroke impacting right sided motor movements and slurred speech with motor base expressive language changes.  Patient has a past medical history including Addison's disease, brain tumor,  PCOS, pancreatitis with chronic nausea vomiting and significant pain difficulties, PUD and maintenance dose of methadone  through pain clinic due to her severe pain associated with her pancreatitis.  Patient has a past medical history including diagnosis of bipolar disorder.  Patient has been having some psychiatric control of previously diagnosed bipolar disorder with risperidone but reportedly stopped taking it years ago due to weight gain.  Patient had psychiatric consultation during her current admission as she began developing decreased need for sleep, racing thoughts, distractibility, mood swings and high energy level.  Patient reported feeling overwhelmed, changes in mood and motivation, crying spells and problems with concentration.  Psychiatry felt this was consistent with bipolar 1 disorder with mixed features and started around psychotropic medications including Abilify , Lamictal  and gabapentin .  Patient has been treated with Ativan  or other benzodiazepines type medications in the past.  Along with the psychotropic medications the patient has also  been started on Keppra  after having a witnessed seizure-like event.  During the clinical visit today, the patient was initially rather sedated and groggy and noted that she felt it was related to the number of medications she is taking.  This would be consistent with a combination of Lamictal , Keppra , Abilify  and gabapentin  all being on board.  However, she became more oriented and alert this Tylenol .  Patient acknowledged her past history but felt that her mood has been stable for the most part and she has been dealing with so much chronic pain and difficulties her medical status really overwhelmed any mood state changes.  Patient has been on long-term opiate prophylactic care for severe pain and has been going to a pain clinic in receiving methadone .  Patient was aware that she did have a stroke but unable to provide any in-depth details around that.  Patient acknowledged changes in motor function for right side and continued to appear to have somewhat of a right-sided facial droop.  She was, however, able to move both her right arm/fingers and right leg/toes but it was clear that she did not have full control or strength in the limbs.  Grip strength 4 right hand with significant left grip strength for left hand.  Patient reported that her mood has been more stable recently acknowledge some disturbance in mood state initially during her hospital stay.  The patient stated she felt comfortable with the current medication regimen and notes that she needs to focus on the rehab efforts.  Patient noted that if she has had an improvement in sleep overall.  While the patient was quite polite and engaged she did acknowledge that she has had some distress and interpretation of staff is being rude etc.  It did appear that she was gaining from inside about outcome of her behavioral responses particularly during work possibly with a hypomanic episode played a role in some of the perceptions of the weight people are engaged  with them.  Patient has previously complained about the discontinued Ativan  but this was not brought up at all.  Discussion today about patient.  Diagnosis:    History of bipolar 1 disorder with recent addition of mood stabilizing medications including Lamictal , gabapentin  and Abilify  with addition of Keppra  by neurology after seizure-like event.         Electronically Signed   _______________________ Norleen Asa, Psy.D. Clinical Neuropsychologist

## 2023-05-07 NOTE — Discharge Instructions (Addendum)
  Inpatient Rehab Discharge Instructions  Brittany Mullins [facility] Discharge date and time: 05/18/23   Activities/Precautions/ Functional Status: Activity: no lifting, driving, or strenuous exercise till cleared by MD Diet: cardiac diet Wound Care: none needed   Functional status:  ___ No restrictions     ___ Walk up steps independently ___ 24/7 supervision/assistance   ___ Walk up steps with assistance _X__ Intermittent supervision/assistance  ___ Bathe/dress independently ___ Walk with walker     ___ Bathe/dress with assistance ___ Walk Independently    ___ Shower independently ___ Walk with assistance    _X__ Shower with assistance _X__ No alcohol      ___ Return to work/school ________   Special Instructions:  No marijuana or other drugs.     COMMUNITY REFERRALS UPON DISCHARGE:    Home Health:   PT      OT      ST                     Agency: [facility]       Phone: [phone] *Please expect follow-up within 2-3 business days for discharge to schedule your home visit. If you have not received follow-up, be sure to contact the site directly.*    Medical Equipment/Items Ordered:rollator, tub transfer bench, and 3in1 bedside commode                                                 Agency/Supplier:[facility] [phone]   GENERAL COMMUNITY RESOURCES FOR PATIENT/FAMILY: *A list of medications will be sent to [facility]. Be sure to bring after visit summary to your clinic appointment as well.*  1)  A referral was placed on your behalf to [facility] # [id number] for PCS (personal care services). Be sure to follow-up to check status of referral.    Outpatient Therapy and Psychiatry for Medicare Recipients  [facility] [address] [place], [place], [address] [phone] phone  [facility] [address] [place], [place], [address] [address] FLO[address] phone ([address]  [facility] [address] - [place], [address], [place], [place], [employer], [address], [employer] Healthy [place], [place], [address], [place], [place], [place], [employer], [employer], [employer], [employer], [place])  [facility] [address], [address] [place], [place], [address] [phone] phone  [facility] [address] [place], [place], [address] [phone] phone  [facility] [address] [place], [place], [address] [phone] phone [phone] fax  [facility], [place] [address], [place], [address] [phone] phone  [facility] [address] [place], [place], [address] [phone] phone [phone] fax  [facility] [address] [place], [place], [address] [phone] phone  Janit Griffins [address] [place], [place], [address] [phone] phone  [facility] [address] [place], [place], [address] [phone] phone [phone] fax   My questions have been answered and I understand these instructions. I will adhere to these goals and the provided educational materials after my discharge from the hospital.  Patient/Caregiver Signature _______________________________ Date __________  Clinician Signature _______________________________________ Date __________  Please bring this form and your medication list with you to all your follow-up doctor's appointments.

## 2023-05-07 NOTE — Progress Notes (Signed)
 Physical Therapy Session Note  Patient Details  Name: Brittany Mullins MRN: [id number] Date of Birth: 10/17/1980  Today's Date: 05/07/2023 PT Individual Time: 0820-0900 PT Individual Time Calculation (min): 40 min   Short Term Goals: Week 1:  PT Short Term Goal 1 (Week 1): Pt wil perform bed mobilty from flat surface with min assist to the L to simulate home environment PT Short Term Goal 2 (Week 1): Pt will perform transfers with min assist PT Short Term Goal 3 (Week 1): Pt will perform 4 steps with min assist PT Short Term Goal 4 (Week 1): Pt will be able to gait x 50' with min assist  Skilled Therapeutic Interventions/Progress Updates: Patient supine in bed asleep on entrance to room. Pt with EEG attachments donned. PTA located and discussed pt's presentation to attending MD and attending nsg for OOB therapy and safety. MD stating it is okay to do so with EEG donned. Patient became alert after verbal initiation on re-entry to room. Pt reported lethargy. Pt also stated early morning presentation of seizure like activity. Pt oriented x 4. NSG provided medication. Pt performed supine<>sit EOB with supervision (HOB elevated). Pt requested BSC use. Pt transferred EOB<BSC with CGA to stand for safety, and light minA for safety to pivot without AD and VC to control descent. NSG present for safety and personal care. Pt more comfortable to void urine with female nsg. PTA stepped outside briefly to allow privacy. Pt sitting EOB on re-entry. Pt participated in dynamic standing (EEG still donned). Pt expressed overwhelming thoughts due to CLOF (stated it really hit after admission to inpatient rehab). PTA provided active listening, support and encouragement. - Moving objects on table from R to L with CGA. Pt with posterior weight shift with VC to adjust towards center prior to moving B UE during intervention. Pt use of B UE's. Pt required VC to adjust B LE to neutral stance to increase standing  balance. Pt demonstrated outside of BOS reaching with CGA/very light minA for safety. Seated rest break required - Pt participated in same dynamic standing balance activity with added cognitive challenge. Pt cued to say ABC's with a word that correlates with alphabet in sequential order with rationale provided. Pt performed task with minA to think of next letter (hinted cues). Pt missed several letters, and would revert back to previously said letter that required minA to get back on track. Pt required seated rest break  Appropriate staff arrived to doff EEG at end of session.  Patient supine in bed at end of session with brakes locked, nsg staff present, bed alarm set, and all needs within reach.      Therapy Documentation Precautions:  Precautions Precautions: Fall Restrictions Weight Bearing Restrictions Per Provider Order: No  Therapy/Group: Individual Therapy  [place] Mcphee PTA 05/07/2023, 12:54 PM

## 2023-05-07 NOTE — Procedures (Addendum)
 Patient Name: Brittany Mullins  MRN: [id number]  Epilepsy Attending: Arlin MALVA Krebs  Referring Physician/Provider: everitt Clint Abbey Earle FORBES, NP  Duration: 05/06/2023 1003 to 05/07/2023 0857   Patient history: 42 year old female with a PMHx significant for seizures (on Keppra ) and HTN who presents to the emergency department for evaluation of acute onset of right face, arm and leg numbness with weakness. EEG to evaluate for seizure.   Level of alertness: Awake, asleep   AEDs during EEG study: LEV, GBP   Technical aspects: This EEG study was done with scalp electrodes positioned according to the 10-20 International system of electrode placement. Electrical activity was reviewed with band pass filter of 1-70Hz , sensitivity of 7 uV/mm, display speed of 6mm/sec with a 60Hz  notched filter applied as appropriate. EEG data were recorded continuously and digitally stored.  Video monitoring was available and reviewed as appropriate.   Description: The posterior dominant rhythm consists of 9 Hz activity of moderate voltage (25-35 uV) seen predominantly in posterior head regions, symmetric and reactive to eye opening and eye closing. Sleep was characterized by vertex waves, sleep spindles (12 to 14 Hz), maximal frontocentral region. Hyperventilation and photic stimulation were not performed.      Event button was pressed on 05/07/2023 at 0629.  Patient was laying in bed, doing something on her phone.  She then put her phone down and pressed the button to call the nurse and told her she is feeling funny.  When nurse went in the room, patient had subtle nonrhythmic movements in left upper extremity.  Concomitant EEG before, during and after the episode showed posterior dominant rhythm.   IMPRESSION: This study is within normal limits. No seizures or epileptiform discharges were seen throughout the recording.   Event button was pressed on2/07/2023 at 0629 as described above without concomitant EEG change.   This was a nonepileptic event.   Christepher Melchior O Mikkel Charrette

## 2023-05-07 NOTE — Progress Notes (Addendum)
 Per Dr Kirstreins-Patient's boyfriend can resume visiting. He is not to bring any bags in with him. He has been informed of this. 0947-Writer spoke with patient's boyfriend about bringing her birth control pills to the hospital. Boyfriend was instructed to bring the pills to the Charge Nurse.

## 2023-05-07 NOTE — Progress Notes (Signed)
 LTM EEG discontinued - no skin breakdown at [facility].

## 2023-05-07 NOTE — Plan of Care (Signed)
  Problem: RH Balance Goal: LTG Patient will maintain dynamic standing with ADLs (OT) Description: LTG:  Patient will maintain dynamic standing balance with assist during activities of daily living (OT)  Flowsheets (Taken 05/07/2023 1312) LTG: Pt will maintain dynamic standing balance during ADLs with: (LTG upgraded due to progress.) Supervision/Verbal cueing Note: LTG upgraded due to progress.    Problem: RH Bathing Goal: LTG Patient will bathe all body parts with assist levels (OT) Description: LTG: Patient will bathe all body parts with assist levels (OT) Flowsheets (Taken 05/07/2023 1312) LTG: Pt will perform bathing with assistance level/cueing: (LTG upgraded due to progress.) Set up assist  LTG: Position pt will perform bathing: Shower Note: LTG upgraded due to progress.    Problem: RH Dressing Goal: LTG Patient will perform upper body dressing (OT) Description: LTG Patient will perform upper body dressing with assist, with/without cues (OT). Flowsheets (Taken 05/07/2023 1312) LTG: Pt will perform upper body dressing with assistance level of: (LTG upgraded due to progress.) Independent Note: LTG upgraded due to progress.  Goal: LTG Patient will perform lower body dressing w/assist (OT) Description: LTG: Patient will perform lower body dressing with assist, with/without cues in positioning using equipment (OT) Flowsheets (Taken 05/07/2023 1312) LTG: Pt will perform lower body dressing with assistance level of: (LTG upgraded due to progress.) Set up assist Note: LTG upgraded due to progress.    Problem: RH Toileting Goal: LTG Patient will perform toileting task (3/3 steps) with assistance level (OT) Description: LTG: Patient will perform toileting task (3/3 steps) with assistance level (OT)  Flowsheets (Taken 05/07/2023 1312) LTG: Pt will perform toileting task (3/3 steps) with assistance level: (LTG upgraded due to progress.) Set up assist Note: LTG upgraded due to progress.    Problem:  RH Functional Use of Upper Extremity Goal: LTG Patient will use RT/LT upper extremity as a (OT) Description: LTG: Patient will use right/left upper extremity as a stabilizer/gross assist/diminished/nondominant/dominant level with assist, with/without cues during functional activity (OT) Flowsheets (Taken 05/07/2023 1312) LTG: Use of upper extremity in functional activities: (LTG upgraded due to progress.) LUE as nondominant level Note: LTG upgraded due to progress.    Problem: RH Toilet Transfers Goal: LTG Patient will perform toilet transfers w/assist (OT) Description: LTG: Patient will perform toilet transfers with assist, with/without cues using equipment (OT) Flowsheets (Taken 05/07/2023 1312) LTG: Pt will perform toilet transfers with assistance level of: (LTG upgraded due to progress.) Supervision/Verbal cueing Note: LTG upgraded due to progress.    Problem: RH Tub/Shower Transfers Goal: LTG Patient will perform tub/shower transfers w/assist (OT) Description: LTG: Patient will perform tub/shower transfers with assist, with/without cues using equipment (OT) Flowsheets (Taken 05/07/2023 1312) LTG: Pt will perform tub/shower stall transfers with assistance level of: (LTG upgraded due to progress.) Supervision/Verbal cueing Note: LTG upgraded due to progress.

## 2023-05-07 NOTE — Progress Notes (Signed)
 Pt called out saying she felt funny.  This LPN went in the room and found the pt shaky and anxious.  Charge nurse called and came to room to assist.  Ativan  given for seizure like activity.  Vitals: T-98.4, BP-142/86, P-71, RR-17, O2-97.  IV started leaking and was removed.  Dan, [place], made aware and he said not to put another IV in right away.

## 2023-05-07 NOTE — Progress Notes (Signed)
 Physical Therapy Session Note  Patient Details  Name: Brittany Mullins MRN: [id number] Date of Birth: 12/15/1980  Today's Date: 05/07/2023 PT Individual Time: 1300-1327 PT Individual Time Calculation (min): 27 min   Short Term Goals: Week 1:  PT Short Term Goal 1 (Week 1): Pt wil perform bed mobilty from flat surface with min assist to the L to simulate home environment PT Short Term Goal 2 (Week 1): Pt will perform transfers with min assist PT Short Term Goal 3 (Week 1): Pt will perform 4 steps with min assist PT Short Term Goal 4 (Week 1): Pt will be able to gait x 50' with min assist  Skilled Therapeutic Interventions/Progress Updates:    Session focused on functional bed mobility, transfers with and without device, functional gait with RW, and dynamic balance activity. Pt performed bed mobility with supervision for supine <> sit during session just for safety initially. Pt performed transfer without device with CGA for balance. Pt propelled w/c with BUE and BLE alternating for general strengthening and endurance x 100' with supervision. This fatigued patient and pt requires rest breaks throughout session. Short distance gait training with RW with L hand orthosis with overall min A x 20' with cues for step length, positioning of RW and upright posture. Cues to reach back for chair prior to sitting. Dynamic balance for altnerating toe taps to 4 step x 2 sets of 10 reps each with min assist with decreased clearance of BLE noted. Performed toileting back in room with min assist for gait into bathroom and CGA for transfer and hygiene/clothing management. Returned to bed with CGA for transfer and S for bed mobility with all needs in reach.   Therapy Documentation Precautions:  Precautions Precautions: Fall Restrictions Weight Bearing Restrictions Per Provider Order: No    Pain: Pain Assessment Pain Score: 0-No pain    Therapy/Group: Individual Therapy  Elnor Pizza  [facility] 05/07/2023, 1:32 PM

## 2023-05-07 NOTE — Progress Notes (Signed)
 Occupational Therapy Session Note  Patient Details  Name: Brittany Mullins MRN: [id number] Date of Birth: 12-01-1980  Today's Date: 05/07/2023 OT Individual Time: 8984-8884 OT Individual Time Calculation (min): 60 min    Short Term Goals: Week 1:  OT Short Term Goal 1 (Week 1): pt will demonstrate improved use of L shoulder to don shirt with min A. OT Short Term Goal 2 (Week 1): Pt will be able to stand with min A to pull pants over hips. OT Short Term Goal 3 (Week 1): pt will be able to safely use RW to ambulate to toilet with mod A.  Skilled Therapeutic Interventions/Progress Updates:    Pt received in bed eager to shower.  See ADL documentation below.  Overall, pt did extremely well in therapy today only needing light CGA with transfers.  She is now actively moving LUE without pain. She was able to pick up a heavy vase of flowers with L hand and move it on her counter, lock her L brakes and use B hands to dry hair with hairdryer and brush. A few times during session, she expressed shortness of breath, if feel like I can't breath!   Cued pt to do slow inhales and exhales 5x using her R pointer finger to trace each finger on L hand.  This helped her a great deal and then she was able to move on at a relaxed pace.   Good participation and focus today.   Pt resting in w/c with all needs met. Alarm set and call light in reach.    Therapy Documentation Precautions:  Precautions Precautions: Fall Restrictions Weight Bearing Restrictions Per Provider Order: No   Pain: No pain  ADL: ADL Eating: Independent Grooming: Modified independent Where Assessed-Grooming: Sitting at sink Upper Body Bathing: Supervision/safety Where Assessed-Upper Body Bathing: Shower Lower Body Bathing: Supervision/safety Where Assessed-Lower Body Bathing: Shower Upper Body Dressing: Modified independent (Device) Where Assessed-Upper Body Dressing: Wheelchair Lower Body Dressing:  Supervision/safety Where Assessed-Lower Body Dressing: Wheelchair Toileting: Supervision/safety Where Assessed-Toileting: Teacher, Adult Education: Furniture Conservator/restorer Method: Surveyor, Minerals: Acupuncturist: Administrator, Arts Method: Warden/ranger: Emergency planning/management officer, Grab bars   Therapy/Group: Individual Therapy  Franceen Erisman 05/07/2023, 1:27 PM

## 2023-05-07 NOTE — Progress Notes (Signed)
 NEUROLOGY CONSULT FOLLOW UP NOTE   Date of service: May 07, 2023 Patient Name: Brittany Mullins MRN:  [id number] DOB:  06/11/1980  Interval Hx/subjective   On exam, patient is upset about her current care, feels that she is being treated rudely by staff.  Discussed plan of care and EEG findings and that Ativan  has been discontinued, she confirmed understanding and agrees with plan of care.   RLE continues to be weaker than LLE. Emphasized importance of working with therapy.    Vitals   Vitals:   05/06/23 0841 05/06/23 1501 05/06/23 2104 05/07/23 0346  BP: (!) 131/91 119/71 129/80 (!) 142/86  Pulse:  62 74 71  Resp:  19 17 17   Temp:  97.9 F (36.6 C) 97.8 F (36.6 C) 98.4 F (36.9 C)  TempSrc:   Oral   SpO2:  98% 99% 97%  Weight:      Height:         Body mass index is 28.35 kg/m.  Physical Exam   Constitutional: Appears well-developed and well-nourished.  Psych: Affect appropriate to situation. Seems overall stressed. Eyes: No scleral injection.  HENT: No OP obstrucion.  Head: Normocephalic.  Respiratory: Effort normal, non-labored breathing.  Skin: WDI.   Neurologic Examination     Mental Status: Oriented to person, place, time and situation, able to give a clear and coherent history of present illness Speech/Language: Speech is without dysarthria or aphasia. Cranial Nerves:  II through XII grossly intact.  Motor:  5/5 strength to bilateral upper extremities 5/5 strength to left lower extremity 4+/5 strength to right lower extremity Tone is normal and bulk is normal Sensation- Intact to light touch bilaterally  Coordination: FTN intact bilaterally, HKS weaker on right.  Gait- deferred  Medications  Current Facility-Administered Medications:    acetaminophen  (TYLENOL ) tablet 325-650 mg, 325-650 mg, Oral, Q4H PRN, Love, Pamela S, PA-C, 650 mg at 05/04/23 0250   albuterol  (PROVENTIL ) (2.5 MG/3ML) 0.083% nebulizer solution 2.5 mg, 2.5 mg,  Nebulization, Q2H PRN, Love, Pamela S, PA-C   alum & mag hydroxide-simeth (MAALOX/MYLANTA) 200-200-20 MG/5ML suspension 30 mL, 30 mL, Oral, Q4H PRN, Love, Pamela S, PA-C   amLODipine  (NORVASC ) tablet 5 mg, 5 mg, Oral, Daily, Love, Pamela S, PA-C, 5 mg at 05/07/23 0825   ARIPiprazole  (ABILIFY ) tablet 5 mg, 5 mg, Oral, Daily, Kirsteins, Prentice BRAVO, MD, 5 mg at 05/07/23 0825   aspirin  EC tablet 81 mg, 81 mg, Oral, Daily, Love, Pamela S, PA-C, 81 mg at 05/07/23 9173   atorvastatin  (LIPITOR) tablet 40 mg, 40 mg, Oral, Daily, Love, Pamela S, PA-C, 40 mg at 05/07/23 9173   bisacodyl  (DULCOLAX) suppository 10 mg, 10 mg, Rectal, Daily PRN, Love, Pamela S, PA-C   blood pressure control book, , Does not apply, Once, Kirsteins, Prentice BRAVO, MD   capsaicin  (ZOSTRIX) 0.025 % cream, , Topical, BID, Kirsteins, Prentice BRAVO, MD, Given at 05/07/23 [phone]   clopidogrel  (PLAVIX ) tablet 75 mg, 75 mg, Oral, Daily, Love, Pamela S, PA-C, 75 mg at 05/07/23 9173   cyclobenzaprine  (FLEXERIL ) tablet 5 mg, 5 mg, Oral, TID PRN, Love, Pamela S, PA-C, 5 mg at 05/04/23 0250   enoxaparin  (LOVENOX ) injection 40 mg, 40 mg, Subcutaneous, Q24H, Love, Pamela S, PA-C, 40 mg at 05/06/23 1826   gabapentin  (NEURONTIN ) capsule 200 mg, 200 mg, Oral, TID, Chien, Stephanie, MD, 200 mg at 05/07/23 9173   guaiFENesin -dextromethorphan (ROBITUSSIN DM) 100-10 MG/5ML syrup 5-10 mL, 5-10 mL, Oral, Q6H PRN, Love, Sharlet RAMAN, PA-C  hydrocortisone  (CORTEF ) tablet 5 mg, 5 mg, Oral, QAC supper, 5 mg at 05/06/23 1715 **AND** hydrocortisone  (CORTEF ) tablet 10 mg, 10 mg, Oral, Q breakfast, Love, Pamela S, PA-C, 10 mg at 05/07/23 0825   hydrOXYzine  (ATARAX ) tablet 10 mg, 10 mg, Oral, TID PRN, Carilyn Prentice BRAVO, MD, 10 mg at 05/06/23 1258   lamoTRIgine  (LAMICTAL ) tablet 25 mg, 25 mg, Oral, BID, Kirsteins, Prentice BRAVO, MD, 25 mg at 05/07/23 0825   levETIRAcetam  (KEPPRA ) tablet 750 mg, 750 mg, Oral, Q8H, Love, Pamela S, PA-C, 750 mg at 05/07/23 0544   losartan  (COZAAR ) tablet  25 mg, 25 mg, Oral, Daily, Love, Pamela S, PA-C, 25 mg at 05/07/23 0825   methadone  (DOLOPHINE ) tablet 120 mg, 120 mg, Oral, Daily, Love, Pamela S, PA-C, 120 mg at 05/07/23 0825   naloxone  (NARCAN ) injection 0.4 mg, 0.4 mg, Intravenous, PRN, Love, Pamela S, PA-C   oxyCODONE -acetaminophen  (PERCOCET/ROXICET) 5-325 MG per tablet 1 tablet, 1 tablet, Oral, Q6H PRN, Kirsteins, Prentice BRAVO, MD   pantoprazole  (PROTONIX ) EC tablet 40 mg, 40 mg, Oral, Q0600, Love, Pamela S, PA-C, 40 mg at 05/07/23 0543   polyethylene glycol (MIRALAX  / GLYCOLAX ) packet 17 g, 17 g, Oral, Daily PRN, Love, Pamela S, PA-C   [COMPLETED] predniSONE  (DELTASONE ) tablet 50 mg, 50 mg, Oral, Q breakfast, 50 mg at 05/03/23 1250 **FOLLOWED BY** [COMPLETED] predniSONE  (DELTASONE ) tablet 40 mg, 40 mg, Oral, Q breakfast, 40 mg at 05/04/23 0930 **FOLLOWED BY** [COMPLETED] predniSONE  (DELTASONE ) tablet 30 mg, 30 mg, Oral, Q breakfast, 30 mg at 05/05/23 0842 **FOLLOWED BY** [COMPLETED] predniSONE  (DELTASONE ) tablet 20 mg, 20 mg, Oral, Q breakfast, 20 mg at 05/06/23 0841 **FOLLOWED BY** [COMPLETED] predniSONE  (DELTASONE ) tablet 10 mg, 10 mg, Oral, Q breakfast, 10 mg at 05/07/23 0826 **FOLLOWED BY** [START ON 05/08/2023] predniSONE  (DELTASONE ) tablet 5 mg, 5 mg, Oral, Q breakfast, Love, Pamela S, PA-C   prochlorperazine  (COMPAZINE ) tablet 5-10 mg, 5-10 mg, Oral, Q6H PRN **OR** prochlorperazine  (COMPAZINE ) suppository 12.5 mg, 12.5 mg, Rectal, Q6H PRN **OR** prochlorperazine  (COMPAZINE ) injection 5-10 mg, 5-10 mg, Intravenous, Q6H PRN, Love, Pamela S, PA-C, 10 mg at 05/06/23 1120   promethazine  (PHENERGAN ) tablet 25 mg, 25 mg, Oral, Q6H PRN **OR** promethazine  (PHENERGAN ) suppository 25 mg, 25 mg, Rectal, Q6H PRN, Love, Pamela S, PA-C   senna-docusate (Senokot-S) tablet 2 tablet, 2 tablet, Oral, Q0600, Love, Pamela S, PA-C, 2 tablet at 05/07/23 0543   sodium phosphate  (FLEET) enema 1 enema, 1 enema, Rectal, Once PRN, Love, Pamela S, PA-C   sorbitol  70 %  solution 60 mL, 60 mL, Oral, Daily PRN, Love, Pamela S, PA-C   traZODone  (DESYREL ) tablet 50 mg, 50 mg, Oral, QHS PRN, Akintayo, Musa A, MD, 50 mg at 05/05/23 2128  Labs and Diagnostic Imaging   CBC:  Recent Labs  Lab 05/02/23 0824 05/06/23 0518  WBC 7.5 9.2  NEUTROABS 4.7  --   HGB 11.7* 11.9*  HCT 35.4* 36.5  MCV 93.7 94.6  PLT 209 248    Basic Metabolic Panel:  Lab Results  Component Value Date   NA 139 05/06/2023   K 4.0 05/06/2023   CO2 27 05/06/2023   GLUCOSE 74 05/06/2023   BUN 22 (H) 05/06/2023   CREATININE 0.77 05/06/2023   CALCIUM  8.8 (L) 05/06/2023   GFRNONAA >60 05/06/2023   GFRAA >60 10/27/2019   Lipid Panel:  Lab Results  Component Value Date   LDLCALC 94 04/23/2023   HgbA1c:  Lab Results  Component Value Date   HGBA1C 5.0 04/23/2023  Urine Drug Screen:     Component Value Date/Time   LABOPIA NONE DETECTED 05/05/2023 1048   COCAINSCRNUR NONE DETECTED 05/05/2023 1048   LABBENZ POSITIVE (A) 05/05/2023 1048   AMPHETMU NONE DETECTED 05/05/2023 1048   THCU POSITIVE (A) 05/05/2023 1048   LABBARB NONE DETECTED 05/05/2023 1048    Alcohol  Level     Component Value Date/Time   ETH <10 04/22/2023 0752   INR  Lab Results  Component Value Date   INR 0.97 12/21/2017    CT Head without contrast: No new acute abnormalities, known left pontine infarct  MRI Brain (Personally reviewed): Partially empty sella, known 14 mm acute infarct in left pons  Continuous EEG 2/3:  This study is within normal limits. No seizures or epileptiform discharges were seen throughout the recording. Event button was pressed on 05/05/2023 at 1200 during which patient was laying in bed, not responding without concomitant EEG change.  This was a nonepileptic event.  Continuous EEG 2/4:  This study is within normal limits. No seizures or epileptiform discharges were seen throughout the recording. Event button was pressed on2/07/2023 at 0629 as described above without  concomitant EEG change.  This was a nonepileptic event.   Assessment  Kelsie Zaborowski is a 43 y.o. 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 On 2/1 she had episodes of possible seizure-like activity where she was unable to open her eyes but was able to talk to others in the room. She states that she has no memory of these episodes. She confirms a history of seizures for about a year and a half, that usually involve jerking of her arms. She also usually gets a particular look on her face before having a seizure and is usually tired after.  - On exam today, she is oriented but stressed about not being able to do things when she gets home.  - We emphasized the importance of OT/PT with her. She is worried that she will have to move because she has multiple stairs to get into her apartment, assured her that OT/PT and SW are working on this for her.  - Discussed the EEG findings with her. Will stop Ativan  PRN as no epileptic spells have been seen on LTM. Discussed stress relief and deep breathing with patient, as well as monitoring of vital signs during episodes with RN.  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    Recommendations  - Discontinue LTM EEG - Continue Keppra  750 mg every 8 hours -  Continue Lamotrigine  25mg  BID -  Continue gabapentin  200mg  TID, ok to increase back to home dose of 300mg  if c/o pain increase without somnolence seen - Continue seizure precautions - Discontinue Ativan  due to no epileptic activity seen on EEG.  If non-epileptic spells seen, advise patient to take deep breaths and monitor vitals during episode. Discussed with patient.  - Discontinue phenergran PRN due to sedation effects and possible interactions seen with concurrent use of  Methadone .  - EKG while on methadone  and compazine  inpatient due to risk for both increasing QT interval - Avoid sedation and deliriogenic medications as possible - OOB and working with therapy as tolerated - Outpatient neurology follow-up - Neurohospitalist service will follow PRN. Please call if there are additional questions.  ______________________________________________________________________   Pt seen by Neuro NP/APP.   Rocky BROCKS  Judithe, DNP, AGACNP-BC [facility] Please use AMION for contact information & EPIC for messaging.  Electronically signed: Dr. Sekou Zuckerman

## 2023-05-07 NOTE — Progress Notes (Signed)
 PROGRESS NOTE   Subjective/Complaints:  Somnolent this am , received IV Ativan , because pt felt a seizure coming on  ROS: Limited due to cognitive/behavioral   Objective:   DG Swallowing Func-Speech Pathology Result Date: 05/06/2023 Table formatting from the original result was not included. Modified Barium Swallow Study Patient Details Name: Brittany Mullins MRN: [id number] Date of Birth: 12/26/1980 Today's Date: 05/06/2023 HPI/PMH: HPI: 43 year old female with history of gastritis/duodenitis, Addison's disease, pituitary tumor, empty sella syndrome, HTN, opiate dependence on methadone , bipolar disease, THC use, PCOS, seizures presented to the ED on 04/22/23 with right sided weakness and numbness.      MRI showed left pontine infarct. CTA no LVO. No interventions due to outside of window. UDS positive THC. DVT prophylaxis with lovenox . Placed on ASA and Plavix  for 3 weeks  and then ASA alone. Keppra  continued as at home. Typically has 1 to 2 seizures per month but can increase to 1-2 seizures a week. Has service Dog for seizure detection. On no home meds for HTN. Placed on Norvasc  and Cozaar . LDL 94 and placed on Lipitor. On methadone  at home due to pain and nausea from chronic pancreatitis. Estrogen for PCOS.     Developed increased left sided weakness. Stroke team recommended repeat CT and  EEG. No evidence of definite seizures.  Keppra  dose adjusted. Noted left upper extremity hypertonia flexion which had also raised concerns for seizure. Clinical Impression: Clinical Impression: Patient presents with oropharyngeal swallowing function that is within functional limits. Oral and pharyngeal clearance was timely and efficient and no significant airway invasion noted across all consistencies. Of note, patient reports globus sensation, intermittent burning/odynophagia when eating, burning sensation in nose and back of throat after waking up  in the morning, and intermittent chest pain that is aleviated with eructation. Brief, nondiagnostic esophageal sweep revealed no overt esophageal clearance difficulty but recommend patient seek esophageal evaluation from GI to determine if esopahgeal pathology may underlie her discomfort during meals. Recommend continuation of regular/thin liquid diet with medications taken whole in puree per patient preference. Factors that may increase risk of adverse event in presence of aspiration Noe & Lianne 2021): No data recorded Recommendations/Plan: Swallowing Evaluation Recommendations Swallowing Evaluation Recommendations Recommendations: PO diet PO Diet Recommendation: Regular; Thin liquids (Level 0) Liquid Administration via: Cup; Straw Medication Administration: Whole meds with puree Supervision: Patient able to self-feed Postural changes: Position pt fully upright for meals Oral care recommendations: Oral care BID (2x/day) Recommended consults: Consider GI consultation; Consider esophageal assessment Treatment Plan Treatment Plan Treatment recommendations: No treatment recommended at this time (no treatment for Swallowing) Recommendations Comment: n/a Functional status assessment: Patient has not had a recent decline in their functional status. Recommendations Recommendations for follow up therapy are one component of a multi-disciplinary discharge planning process, led by the attending physician.  Recommendations may be updated based on patient status, additional functional criteria and insurance authorization. Assessment: Orofacial Exam: Orofacial Exam Oral Cavity: Oral Hygiene: WFL Oral Cavity - Dentition: Dentures, top Orofacial Anatomy: WFL Oral Motor/Sensory Function: Suspected cranial nerve impairment CN V - Trigeminal: Right sensory impairment CN IX - Glossopharyngeal, CN X - Vagus: Right motor impairment Anatomy: Anatomy: [facility]  Boluses Administered: Boluses Administered Boluses Administered: Mildly thick  liquids (Level 2, nectar thick); Thin liquids (Level 0); Moderately thick liquids (Level 3, honey thick); Puree; Solid  Oral Impairment Domain: Oral Impairment Domain Lip Closure: No labial escape Tongue control during bolus hold: Cohesive bolus between tongue to palatal seal Bolus preparation/mastication: Timely and efficient chewing and mashing Bolus transport/lingual motion: Brisk tongue motion Oral residue: Trace residue lining oral structures Location of oral residue : Tongue; Palate Initiation of pharyngeal swallow : Pyriform sinuses  Pharyngeal Impairment Domain: Pharyngeal Impairment Domain Soft palate elevation: No bolus between soft palate (SP)/pharyngeal wall (PW) Laryngeal elevation: Complete superior movement of thyroid cartilage with complete approximation of arytenoids to epiglottic petiole Anterior hyoid excursion: Complete anterior movement Epiglottic movement: Partial inversion (due to abbutting against posterior pharyngeal wall) Laryngeal vestibule closure: Complete, no air/contrast in laryngeal vestibule Pharyngeal stripping wave : Present - complete Pharyngeal contraction (A/P view only): N/A Pharyngoesophageal segment opening: Complete distension and complete duration, no obstruction of flow Tongue base retraction: No contrast between tongue base and posterior pharyngeal wall (PPW) Pharyngeal residue: Trace residue within or on pharyngeal structures Location of pharyngeal residue: Valleculae  Esophageal Impairment Domain: Esophageal Impairment Domain Esophageal clearance upright position: Complete clearance, esophageal coating Pill: Pill Consistency administered: Puree Puree: WFL Penetration/Aspiration Scale Score: Penetration/Aspiration Scale Score 1.  Material does not enter airway: Thin liquids (Level 0); Mildly thick liquids (Level 2, nectar thick); Moderately thick liquids (Level 3, honey thick); Puree; Solid; Pill Compensatory Strategies: Compensatory Strategies Compensatory strategies:  No   General Information: Caregiver present: No  Diet Prior to this Study: Regular; Thin liquids (Level 0)   Temperature : Normal   Respiratory Status: WFL   Supplemental O2: None (Room air)   History of Recent Intubation: No  Behavior/Cognition: Alert; Cooperative; Pleasant mood Self-Feeding Abilities: Able to self-feed Baseline vocal quality/speech: Hypophonia/low volume Volitional Cough: Able to elicit Volitional Swallow: Able to elicit Exam Limitations: Limited visibility (due to patient's frozen shoulder) Goal Planning: No data recorded No data recorded No data recorded No data recorded Consulted and agree with results and recommendations: Patient Pain: Pain Assessment Pain Assessment: Faces Faces Pain Scale: 4 Pain Location: Shoulders End of Session: Start Time:No data recorded Stop Time: No data recorded Time Calculation:No data recorded Charges: No data recorded SLP visit diagnosis: SLP Visit Diagnosis: Dysarthria and anarthria (R47.1) Past Medical History: Past Medical History: Diagnosis Date  Addison's disease (HCC)   Brain tumor (benign) (HCC)   Chronic pancreatitis (HCC)   Hearing loss   Hypertension   Marijuana use   Per pt: medical marijuana patient  Pancreatitis   PCOS (polycystic ovarian syndrome)   Pituitary tumor   Seizures (HCC)  Past Surgical History: Past Surgical History: Procedure Laterality Date  ABDOMINAL HYSTERECTOMY    APPENDECTOMY    BIOPSY  08/16/2021  Procedure: BIOPSY;  Surgeon: San Sandor GAILS, DO;  Location: [facility];  Service: Gastroenterology;;  CESAREAN SECTION    CHOLECYSTECTOMY    ELBOW SURGERY    ESOPHAGOGASTRODUODENOSCOPY (EGD) WITH PROPOFOL  N/A 08/16/2021  Procedure: ESOPHAGOGASTRODUODENOSCOPY (EGD) WITH PROPOFOL ;  Surgeon: San Sandor GAILS, DO;  Location: [facility];  Service: Gastroenterology;  Laterality: N/A;  kidney stent    pancreatic stent    RADIOLOGY WITH ANESTHESIA N/A 04/25/2023  Procedure: MRI of Brain without contrast;  Surgeon: Radiologist,  Medication, MD;  Location: [facility] OR;  Service: Radiology;  Laterality: N/A;  RADIOLOGY WITH ANESTHESIA N/A 05/03/2023  Procedure: MRI WITH ANESTHESIA CERVICAL SPINE WITHOUT CONTRAST;  Surgeon: Radiologist,  Medication, MD;  Location: [facility] OR;  Service: Radiology;  Laterality: N/A; Brittany Mullins 05/06/2023, 12:17 PM  Overnight EEG with video Result Date: 05/06/2023 Shelton Arlin KIDD, MD     05/06/2023 10:30 AM Patient Name: Brittany Mullins MRN: [id number] Epilepsy Attending: Arlin KIDD Shelton Referring Physician/Provider: everitt Clint Abbey Earle FORBES, NP Duration: 05/05/2023 1003 to 05/06/2023 1003  Patient history: 43 year old female with a PMHx significant for seizures (on Keppra ) and HTN who presents to the emergency department for evaluation of acute onset of right face, arm and leg numbness with weakness. EEG to evaluate for seizure.  Level of alertness: Awake, asleep  AEDs during EEG study: LEV, GBP  Technical aspects: This EEG study was done with scalp electrodes positioned according to the 10-20 International system of electrode placement. Electrical activity was reviewed with band pass filter of 1-70Hz , sensitivity of 7 uV/mm, display speed of 94mm/sec with a 60Hz  notched filter applied as appropriate. EEG data were recorded continuously and digitally stored.  Video monitoring was available and reviewed as appropriate.  Description: The posterior dominant rhythm consists of 9 Hz activity of moderate voltage (25-35 uV) seen predominantly in posterior head regions, symmetric and reactive to eye opening and eye closing. Sleep was characterized by vertex waves, sleep spindles (12 to 14 Hz), maximal frontocentral region. Hyperventilation and photic stimulation were not performed.   Event button was pressed on 05/05/2023 at 1200.  Patient was sitting in bed, eyes open, not responding.  Concomitant EEG before, during and after the episode showed posterior dominant rhythm.  IMPRESSION: This study is within normal limits. No  seizures or epileptiform discharges were seen throughout the recording.  Event button was pressed on 05/05/2023 at 1200 during which patient was laying in bed, not responding without concomitant EEG change.  This was a nonepileptic event.  Arlin KIDD Shelton   Recent Labs    05/06/23 0518  WBC 9.2  HGB 11.9*  HCT 36.5  PLT 248   Recent Labs    05/05/23 0810 05/06/23 0518  NA 139 139  K 3.9 4.0  CL 104 105  CO2 27 27  GLUCOSE 75 74  BUN 25* 22*  CREATININE 0.80 0.77  CALCIUM  9.1 8.8*    Intake/Output Summary (Last 24 hours) at 05/07/2023 0839 Last data filed at 05/07/2023 0700 Gross per 24 hour  Intake 860 ml  Output --  Net 860 ml        Physical Exam: Vital Signs Blood pressure (!) 142/86, pulse 71, temperature 98.4 F (36.9 C), resp. rate 17, height 5' 2 (1.575 m), weight 70.3 kg, SpO2 97%.    General: No acute distress Mood and affect are appropriate Heart: Regular rate and rhythm no rubs murmurs or extra sounds Lungs: Clear to auscultation, breathing unlabored, no rales or wheezes Abdomen: Positive bowel sounds, soft nontender to palpation, nondistended Extremities: No clubbing, cyanosis, or edema Skin: No evidence of breakdown, no evidence of rash   Skin: No evidence of breakdown, no evidence of rash Neurologic: Cranial nerves II through XII intact, motor strength is 3- /5 in bilateral deltoid, bicep, tricep,RIght  grip, 5/5 Left grip, 5/5 left and 4/5 right hip flexor, knee extensors, ankle dorsiflexor and plantar flexor Some pain inhibition LUE  Sensory examreduced LT sensation RIght 4th and 5th digit, left shoulder  Cerebellar exam limited by strength on right and pain on left  Musculoskeletal: Full range of motion in all 4 extremities. No joint swelling Moving Left shoulder better Trigger  point Left upper trap   Assessment/Plan: 1. Functional deficits which require 3+ hours per day of interdisciplinary therapy in a comprehensive inpatient rehab  setting. Physiatrist is providing close team supervision and 24 hour management of active medical problems listed below. Physiatrist and rehab team continue to assess barriers to discharge/monitor patient progress toward functional and medical goals  Care Tool:  Bathing    Body parts bathed by patient: Abdomen, Chest, Front perineal area, Right upper leg, Left upper leg, Left lower leg, Face, Left arm, Right arm   Body parts bathed by helper: Right lower leg, Buttocks, Right arm, Left arm     Bathing assist Assist Level: Minimal Assistance - Patient > 75%     Upper Body Dressing/Undressing Upper body dressing   What is the patient wearing?: Bra, Pull over shirt    Upper body assist Assist Level: Moderate Assistance - Patient 50 - 74%    Lower Body Dressing/Undressing Lower body dressing      What is the patient wearing?: Underwear/pull up, Pants     Lower body assist Assist for lower body dressing: Contact Guard/Touching assist     Toileting Toileting    Toileting assist Assist for toileting: Contact Guard/Touching assist     Transfers Chair/bed transfer  Transfers assist     Chair/bed transfer assist level: Moderate Assistance - Patient 50 - 74%     Locomotion Ambulation   Ambulation assist      Assist level: Moderate Assistance - Patient 50 - 74% Assistive device: No Device Max distance: 10   Walk 10 feet activity   Assist     Assist level: Moderate Assistance - Patient - 50 - 74% Assistive device: No Device   Walk 50 feet activity   Assist Walk 50 feet with 2 turns activity did not occur: Safety/medical concerns         Walk 150 feet activity   Assist Walk 150 feet activity did not occur: Safety/medical concerns         Walk 10 feet on uneven surface  activity   Assist Walk 10 feet on uneven surfaces activity did not occur: Safety/medical concerns         Wheelchair     Assist Is the patient using a wheelchair?:  Yes Type of Wheelchair: Manual      Max wheelchair distance: 50    Wheelchair 50 feet with 2 turns activity    Assist        Assist Level: Minimal Assistance - Patient > 75%   Wheelchair 150 feet activity     Assist      Assist Level: Maximal Assistance - Patient 25 - 49%   Blood pressure (!) 142/86, pulse 71, temperature 98.4 F (36.9 C), resp. rate 17, height 5' 2 (1.575 m), weight 70.3 kg, SpO2 97%.  Medical Problem List and Plan: 1. Functional deficits secondary to Lft ventral pontine CVA, likely small vessel disease             -patient may shower             -ELOS/Goals: 10-14 days,  Mod I to Sup PT/OT/SLP            -Continue CIR therapies including PT, OT --therapies today once EEG ended 2.  Antithrombotics: -DVT/anticoagulation:  Pharmaceutical: Lovenox              -antiplatelet therapy: DAPT X 3 weeks followed by ASA alone.  3. Pain Management: On Methadone  (followed by [facility]).             --  now on prn oxycodone  due to shoulder/arm pain             --gabapentin  TID for neuropathy 4. H/o bipolar d/o/Mood/Behavior/Sleep: LCSW to follow for evaluation and support.              -2/3 psych recommended abilify  5mg  q am and lamictal  25mg  bid   -abilify  begins today, lamictal  started yesterday   -seems to be a bit more settled today   -continue to provide egosupport             -antipsychotic agents: N/A--has not been on any psych meds for years.  5. Neuropsych/cognition: This patient is capable of making decisions on her own behalf. 6. Skin/Wound Care: Routine pressure relief measures. 7. Fluids/Electrolytes/Nutrition: Monitor I/O.    2/3 potassium 4.0, continue daily supplement     -bun a little better. Continue to encourage fluids 8. HTN: Monitor BP TID--continue Cozaar  and Norvasc .    Bp's currently tolerable 9. Seizure d/o: Now on Keppra  TID 10. Addison's disease: Now on Cortef  10 mg am/5 mg pm 11. Chronic pancreatitis: Has chronic issues with  nausea --continue phenergan  --continue PPI 13. Constipation: Reports rectal bleeding-->colace added to senna due to hx of hems.   14. Left arm shoulder/elbow pain             -Xray elbow/shoulder 1/28 negative Suspect myofascial pain +/- frozen shoulder - improved shoulder ROM, still with upper trap trigger point -   2/3 pt received trigger point injection yesterday. ?improvement--was focused on other issues today 15. Marijuana use             -Advise cessation, associated with increased stroke risk 26-44% depending on study   -2/3 uds + yesterday for thc, benzos. Urine drug profile sent to [facility], pending 16. Hx of pituitary tumor             -f/u Neurosurgery 17. Low protein S             -f/u Hematology outpatient, will need retesting in ~35mo  18.  Hx sensorineural hearing loss with resultant dysarthria  19.  LUE numbness C5 distribution shoulder pain , ? Proximal weakness vs pain inhibition , only mild C4-5 stenosis on MRI  , MRI reviewed with Neuroradiology , no spinal cord signal abnormalities Neurosurg, Dr Dawley looked at MRI , C spine looks stable  Still has subjective numbess in left shoulder   20.  Insomnia- likely multifactorial, anxiety regarding medical condition, underlying anxiety d/o and on higher dose steroid taper - expect some improvement with steroid taper ,  - hydroxyzine  for anxiety  -did sleep last night 21.  Seizure like episode,  Prior hx but not well documented in medical record, recent Left pontine infarct (lower incidence of seizure for this anatomic location vs cortical) also with empty sella syndrome with diagnosis of addison's disease , already on moderate dose Keppra -   -CT of head without changes  -EEG without sz activity so far  D/c LTM EEG per neurology Concerned that pt requesting Ativan  IV for the feeling of seizure coning on will defer to neuro if this order needs to be revised (should only get if pt has seizure >5 min) LOS: 6 days A FACE TO FACE  EVALUATION WAS PERFORMED  Prentice FORBES [place] 05/07/2023, 8:39 AM    7

## 2023-05-08 DIAGNOSIS — M25512 Pain in left shoulder: Secondary | ICD-10-CM | POA: Diagnosis not present

## 2023-05-08 DIAGNOSIS — F1129 Opioid dependence with unspecified opioid-induced disorder: Secondary | ICD-10-CM | POA: Diagnosis not present

## 2023-05-08 DIAGNOSIS — I639 Cerebral infarction, unspecified: Secondary | ICD-10-CM | POA: Diagnosis not present

## 2023-05-08 DIAGNOSIS — G5621 Lesion of ulnar nerve, right upper limb: Secondary | ICD-10-CM | POA: Diagnosis not present

## 2023-05-08 MED ORDER — ARIPIPRAZOLE 5 MG PO TABS
7.5000 mg | ORAL_TABLET | Freq: Every day | ORAL | Status: DC
Start: 2023-05-09 — End: 2023-05-14
  Administered 2023-05-09 – 2023-05-14 (×6): 7.5 mg via ORAL
  Filled 2023-05-08 (×7): qty 2

## 2023-05-08 MED ORDER — CAPSAICIN 0.075 % EX CREA
TOPICAL_CREAM | Freq: Two times a day (BID) | CUTANEOUS | Status: DC
  Administered 2023-05-09: 1 via TOPICAL
  Filled 2023-05-08 (×2): qty 57

## 2023-05-08 NOTE — Progress Notes (Signed)
 Patient ID: Brittany Mullins, female   DOB: May 04, 1980, 43 y.o.   MRN: [id number]  SW met with pt in room to provide updates from team conference, and inform on d/c date 2/14. Sw discussed with pt if she is able to possibly stay someone else due to stairs since they have not practiced at this time. States she is unsure on where she would go. Pt will need to be able to navigate stairs. SW will share with medical team.    Graeme Jude, MSW, LCSW Office: [phone] Cell: [phone] Fax: [phone]

## 2023-05-08 NOTE — Progress Notes (Signed)
 Physical Therapy Session Note  Patient Details  Name: Brittany Mullins MRN: [id number] Date of Birth: [birth year]  Today's Date: 05/08/2023 PT Individual Time: 1415-1450 PT Individual Time Calculation (min): 35 min   Short Term Goals: Week 1:  PT Short Term Goal 1 (Week 1): Pt wil perform bed mobilty from flat surface with min assist to the L to simulate home environment PT Short Term Goal 2 (Week 1): Pt will perform transfers with min assist PT Short Term Goal 3 (Week 1): Pt will perform 4 steps with min assist PT Short Term Goal 4 (Week 1): Pt will be able to gait x 50' with min assist  Skilled Therapeutic Interventions/Progress Updates: Patient supine in bed with nsg present on entrance to room. Patient alert and agreeable to PT session.   Patient reported feeling nauseas since later this morning when taking medications. Pt still nauseas at beginning of session, and willing to do as much as able in that space. Pt performed supine<>sit EOB with supervision (HOB elevated). Pt sat on EOB with increase in nausea present. Pt back to supine to lay on side with emetic bag (no emetic event). Pt sat back to EOB with VC to do so slowly. Pt with increase in anxiety due to CLOF and not being able to perform as much this session. PTA provided encouragement and active listening. Pt guided in diaphragmatic breathing with VC to place L and on abdomin, and to allow breath to gently push hand away, and to exhale through nostrils. Pt back to supine and guided in sensory grounding technique to increase presence to environment by finding 1-3 things pt can touch, see, smell, taste and hear. Pt stated this assisted in decreasing anxiety. Pt received phone from apartment regarding attempt to change from 3rd floor to 1st floor. Pt discouraged after phone call (pt's boyfriend called and pt given time to update him on situation). Pt wants to do steps during next steps tomorrow during therapy. PTA provided further  encouragement and active listening, followed by instructions to perform sensory grounding technique when anxiety begins to rise)  Patient supine in bed at end of session with brakes locked, bed alarm set, and all needs within reach.      Therapy Documentation Precautions:  Precautions Precautions: Fall Restrictions Weight Bearing Restrictions Per Provider Order: No   Therapy/Group: Individual Therapy  Laneka Mcgrory PTA 05/08/2023, 3:17 PM

## 2023-05-08 NOTE — Plan of Care (Signed)
 Goals upgraded to reflect progress Problem: RH Swallowing Goal: LTG Patient will consume least restrictive diet using compensatory strategies with assistance (SLP) Description: LTG:  Patient will consume least restrictive diet using compensatory strategies with assistance (SLP) Flowsheets (Taken 05/08/2023 1628) LTG: Pt Patient will consume least restrictive diet using compensatory strategies with assistance of (SLP): Modified Independent   Problem: RH Expression Communication Goal: LTG Patient will increase speech intelligibility (SLP) Description: LTG: Patient will increase speech intelligibility at word/phrase/conversation level with cues, % of the time (SLP) Flowsheets Taken 05/08/2023 1628 LTG: Patient will increase speech intelligibility (SLP): Supervision Level: Conversation level Taken 05/02/2023 1601 Percent of time patient will use intelligible speech: 100   Problem: RH Problem Solving Goal: LTG Patient will demonstrate problem solving for (SLP) Description: LTG:  Patient will demonstrate problem solving for basic/complex daily situations with cues  (SLP) Flowsheets (Taken 05/08/2023 1628) LTG Patient will demonstrate problem solving for: Supervision   Problem: RH Memory Goal: LTG Patient will use memory compensatory aids to (SLP) Description: LTG:  Patient will use memory compensatory aids to recall biographical/new, daily complex information with cues (SLP) Flowsheets (Taken 05/08/2023 1628) LTG: Patient will use memory compensatory aids to (SLP): Supervision   Problem: RH Attention Goal: LTG Patient will demonstrate this level of attention during functional activites (SLP) Description: LTG:  Patient will will demonstrate this level of attention during functional activites (SLP) Flowsheets (Taken 05/08/2023 1628) LTG: Patient will demonstrate this level of attention during cognitive/linguistic activities with assistance of (SLP): Supervision

## 2023-05-08 NOTE — Progress Notes (Addendum)
 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 Modified Barium Swallow Study Patient Details Name: Felicity Penix MRN: [id number] Date of Birth: 03/15/1981 Today's Date: 05/06/2023 HPI/PMH: HPI: 43 year old female with history of gastritis/duodenitis, Addison's disease, pituitary tumor, empty sella syndrome, HTN, opiate dependence on methadone , bipolar disease, THC use, PCOS, seizures presented to the ED on 04/22/23 with right sided weakness and numbness.      MRI showed left pontine infarct. CTA no LVO. No interventions due to outside of window. UDS positive THC. DVT prophylaxis with lovenox . Placed on ASA and Plavix  for 3 weeks  and then ASA alone. Keppra  continued as at home. Typically has 1 to 2 seizures per month but can increase to 1-2 seizures a week. Has service Dog for seizure detection. On no home meds for HTN. Placed on Norvasc  and Cozaar . LDL 94 and placed on Lipitor. On methadone  at home due to pain and nausea from chronic pancreatitis. Estrogen for PCOS.     Developed increased left sided weakness. Stroke team recommended repeat CT and  EEG. No evidence of definite seizures.  Keppra  dose adjusted. Noted left upper extremity hypertonia flexion which had also raised concerns for seizure. Clinical Impression: Clinical Impression: Patient presents with oropharyngeal swallowing function that is within functional limits. Oral and pharyngeal clearance was timely and efficient and no significant airway invasion noted across all consistencies. Of note, patient reports globus sensation, intermittent burning/odynophagia when eating, burning sensation in nose and back of throat after waking up in the morning, and intermittent chest pain  that is aleviated with eructation. Brief, nondiagnostic esophageal sweep revealed no overt esophageal clearance difficulty but recommend patient seek esophageal evaluation from GI to determine if esopahgeal pathology may underlie her discomfort during meals. Recommend continuation of regular/thin liquid diet with medications taken whole in puree per patient preference. Factors that may increase risk of adverse event in presence of aspiration Noe & Lianne 2021): No data recorded Recommendations/Plan: Swallowing Evaluation Recommendations Swallowing Evaluation Recommendations Recommendations: PO diet PO Diet Recommendation: Regular; Thin liquids (Level 0) Liquid Administration via: Cup; Straw Medication Administration: Whole meds with puree Supervision: Patient able to self-feed Postural changes: Position pt fully upright for meals Oral care recommendations: Oral care BID (2x/day) Recommended consults: Consider GI consultation; Consider esophageal assessment Treatment Plan Treatment Plan Treatment recommendations: No treatment recommended at this time (no treatment for Swallowing) Recommendations Comment: n/a Functional status assessment: Patient has not had a recent decline in their functional status. Recommendations Recommendations for follow up therapy are one component of a multi-disciplinary discharge planning process, led by the attending physician.  Recommendations may be updated based on patient status, additional functional criteria and insurance authorization. Assessment: Orofacial Exam: Orofacial Exam Oral Cavity: Oral Hygiene: WFL Oral Cavity - Dentition: Dentures, top Orofacial Anatomy: WFL Oral Motor/Sensory Function: Suspected cranial nerve impairment CN V - Trigeminal: Right sensory impairment CN IX - Glossopharyngeal, CN X - Vagus: Right motor impairment Anatomy: Anatomy: WFL Boluses Administered: Boluses Administered Boluses Administered:  Mildly thick liquids (Level 2, nectar thick); Thin  liquids (Level 0); Moderately thick liquids (Level 3, honey thick); Puree; Solid  Oral Impairment Domain: Oral Impairment Domain Lip Closure: No labial escape Tongue control during bolus hold: Cohesive bolus between tongue to palatal seal Bolus preparation/mastication: Timely and efficient chewing and mashing Bolus transport/lingual motion: Brisk tongue motion Oral residue: Trace residue lining oral structures Location of oral residue : Tongue; Palate Initiation of pharyngeal swallow : Pyriform sinuses  Pharyngeal Impairment Domain: Pharyngeal Impairment Domain Soft palate elevation: No bolus between soft palate (SP)/pharyngeal wall (PW) Laryngeal elevation: Complete superior movement of thyroid cartilage with complete approximation of arytenoids to epiglottic petiole Anterior hyoid excursion: Complete anterior movement Epiglottic movement: Partial inversion (due to abbutting against posterior pharyngeal wall) Laryngeal vestibule closure: Complete, no air/contrast in laryngeal vestibule Pharyngeal stripping wave : Present - complete Pharyngeal contraction (A/P view only): N/A Pharyngoesophageal segment opening: Complete distension and complete duration, no obstruction of flow Tongue base retraction: No contrast between tongue base and posterior pharyngeal wall (PPW) Pharyngeal residue: Trace residue within or on pharyngeal structures Location of pharyngeal residue: Valleculae  Esophageal Impairment Domain: Esophageal Impairment Domain Esophageal clearance upright position: Complete clearance, esophageal coating Pill: Pill Consistency administered: Puree Puree: WFL Penetration/Aspiration Scale Score: Penetration/Aspiration Scale Score 1.  Material does not enter airway: Thin liquids (Level 0); Mildly thick liquids (Level 2, nectar thick); Moderately thick liquids (Level 3, honey thick); Puree; Solid; Pill Compensatory Strategies: Compensatory Strategies Compensatory strategies: No   General Information: Caregiver  present: No  Diet Prior to this Study: Regular; Thin liquids (Level 0)   Temperature : Normal   Respiratory Status: WFL   Supplemental O2: None (Room air)   History of Recent Intubation: No  Behavior/Cognition: Alert; Cooperative; Pleasant mood Self-Feeding Abilities: Able to self-feed Baseline vocal quality/speech: Hypophonia/low volume Volitional Cough: Able to elicit Volitional Swallow: Able to elicit Exam Limitations: Limited visibility (due to patient's frozen shoulder) Goal Planning: No data recorded No data recorded No data recorded No data recorded Consulted and agree with results and recommendations: Patient Pain: Pain Assessment Pain Assessment: Faces Faces Pain Scale: 4 Pain Location: Shoulders End of Session: Start Time:No data recorded Stop Time: No data recorded Time Calculation:No data recorded Charges: No data recorded SLP visit diagnosis: SLP Visit Diagnosis: Dysarthria and anarthria (R47.1) Past Medical History: Past Medical History: Diagnosis Date  Addison's disease (HCC)   Brain tumor (benign) (HCC)   Chronic pancreatitis (HCC)   Hearing loss   Hypertension   Marijuana use   Per pt: medical marijuana patient  Pancreatitis   PCOS (polycystic ovarian syndrome)   Pituitary tumor   Seizures (HCC)  Past Surgical History: Past Surgical History: Procedure Laterality Date  ABDOMINAL HYSTERECTOMY    APPENDECTOMY    BIOPSY  08/16/2021  Procedure: BIOPSY;  Surgeon: San Sandor GAILS, DO;  Location: [facility];  Service: Gastroenterology;;  CESAREAN SECTION    CHOLECYSTECTOMY    ELBOW SURGERY    ESOPHAGOGASTRODUODENOSCOPY (EGD) WITH PROPOFOL  N/A 08/16/2021  Procedure: ESOPHAGOGASTRODUODENOSCOPY (EGD) WITH PROPOFOL ;  Surgeon: San Sandor GAILS, DO;  Location: [facility];  Service: Gastroenterology;  Laterality: N/A;  kidney stent    pancreatic stent    RADIOLOGY WITH ANESTHESIA N/A 04/25/2023  Procedure: MRI of Brain without contrast;  Surgeon: Radiologist, Medication, MD;  Location: [facility] OR;  Service:  Radiology;  Laterality: N/A;  RADIOLOGY WITH ANESTHESIA N/A 05/03/2023  Procedure: MRI WITH ANESTHESIA CERVICAL SPINE WITHOUT CONTRAST;  Surgeon: Radiologist, Medication, MD;  Location: [facility] OR;  Service: Radiology;  Laterality: N/A; Rosina DELENA Downy 05/06/2023, 12:17 PM  Overnight EEG with video Result Date: 05/06/2023 Shelton Arlin KIDD, MD     05/06/2023 10:30 AM Patient Name: Brittany Mullins MRN: [id number] Epilepsy Attending: Arlin KIDD Shelton Referring Physician/Provider: everitt Clint Abbey Earle FORBES, NP Duration: 05/05/2023 1003 to 05/06/2023 1003  Patient history: 43 year old female with a PMHx significant for seizures (on Keppra ) and HTN who presents to the emergency department for evaluation of acute onset of right face, arm and leg numbness with weakness. EEG to evaluate for seizure.  Level of alertness: Awake, asleep  AEDs during EEG study: LEV, GBP  Technical aspects: This EEG study was done with scalp electrodes positioned according to the 10-20 International system of electrode placement. Electrical activity was reviewed with band pass filter of 1-70Hz , sensitivity of 7 uV/mm, display speed of 55mm/sec with a 60Hz  notched filter applied as appropriate. EEG data were recorded continuously and digitally stored.  Video monitoring was available and reviewed as appropriate.  Description: The posterior dominant rhythm consists of 9 Hz activity of moderate voltage (25-35 uV) seen predominantly in posterior head regions, symmetric and reactive to eye opening and eye closing. Sleep was characterized by vertex waves, sleep spindles (12 to 14 Hz), maximal frontocentral region. Hyperventilation and photic stimulation were not performed.   Event button was pressed on 05/05/2023 at 1200.  Patient was sitting in bed, eyes open, not responding.  Concomitant EEG before, during and after the episode showed posterior dominant rhythm.  IMPRESSION: This study is within normal limits. No seizures or epileptiform discharges were seen  throughout the recording.  Event button was pressed on 05/05/2023 at 1200 during which patient was laying in bed, not responding without concomitant EEG change.  This was a nonepileptic event.  Arlin KIDD Shelton   Recent Labs    05/06/23 0518  WBC 9.2  HGB 11.9*  HCT 36.5  PLT 248   Recent Labs    05/05/23 0810 05/06/23 0518  NA 139 139  K 3.9 4.0  CL 104 105  CO2 27 27  GLUCOSE 75 74  BUN 25* 22*  CREATININE 0.80 0.77  CALCIUM  9.1 8.8*    Intake/Output Summary (Last 24 hours) at 05/08/2023 0757 Last data filed at 05/07/2023 1809 Gross per 24 hour  Intake 717 ml  Output --  Net 717 ml        Physical Exam: Vital Signs Blood pressure 137/87, pulse 75, temperature 97.7 F (36.5 C), temperature source Oral, resp. rate 18, height 5' 2 (1.575 m), weight 70.3 kg, SpO2 99%.    General: No acute distress Mood and affect are appropriate Heart: Regular rate and rhythm no rubs murmurs or extra sounds Lungs: Clear to auscultation, breathing unlabored, no rales or wheezes Abdomen: Positive bowel sounds, soft nontender to palpation, nondistended Extremities: No clubbing, cyanosis, or edema Skin: No evidence of breakdown, no evidence of rash   Skin: No evidence of breakdown, no evidence of rash Neurologic: Cranial nerves II through XII intact, motor strength is 3- /5 in bilateral deltoid, bicep, tricep,RIght  grip, 5/5 Left grip, 5/5 left and 4/5 right hip flexor, knee extensors, ankle dorsiflexor and plantar flexor Some pain inhibition LUE  Sensory examreduced LT sensation RIght 4th and 5th digit, left shoulder  Cerebellar exam limited by strength on right and pain on left  Musculoskeletal: Full range of motion in all 4 extremities. No joint swelling Moving Left shoulder better Trigger point Left upper trap  Assessment/Plan: 1. Functional deficits which require 3+ hours per day of interdisciplinary therapy in a comprehensive inpatient rehab setting. Physiatrist is providing  close team supervision and 24 hour management of active medical problems listed below. Physiatrist and rehab team continue to assess barriers to discharge/monitor patient progress toward functional and medical goals  Care Tool:  Bathing    Body parts bathed by patient: Abdomen, Chest, Front perineal area, Right upper leg, Left upper leg, Left lower leg, Face, Left arm, Right arm, Buttocks, Right lower leg   Body parts bathed by helper: Right lower leg, Buttocks, Right arm, Left arm     Bathing assist Assist Level: Supervision/Verbal cueing     Upper Body Dressing/Undressing Upper body dressing   What is the patient wearing?: Bra, Pull over shirt    Upper body assist Assist Level: Independent with assistive device    Lower Body Dressing/Undressing Lower body dressing      What is the patient wearing?: Underwear/pull up, Pants     Lower body assist Assist for lower body dressing: Supervision/Verbal cueing     Toileting Toileting    Toileting assist Assist for toileting: Supervision/Verbal cueing     Transfers Chair/bed transfer  Transfers assist     Chair/bed transfer assist level: Contact Guard/Touching assist     Locomotion Ambulation   Ambulation assist      Assist level: Minimal Assistance - Patient > 75% Assistive device: Walker-rolling Max distance: 20   Walk 10 feet activity   Assist     Assist level: Minimal Assistance - Patient > 75% Assistive device: Walker-rolling, Orthosis   Walk 50 feet activity   Assist Walk 50 feet with 2 turns activity did not occur: Safety/medical concerns         Walk 150 feet activity   Assist Walk 150 feet activity did not occur: Safety/medical concerns         Walk 10 feet on uneven surface  activity   Assist Walk 10 feet on uneven surfaces activity did not occur: Safety/medical concerns         Wheelchair     Assist Is the patient using a wheelchair?: Yes Type of Wheelchair:  Manual    Wheelchair assist level: Supervision/Verbal cueing Max wheelchair distance: 100    Wheelchair 50 feet with 2 turns activity    Assist        Assist Level: Supervision/Verbal cueing   Wheelchair 150 feet activity     Assist      Assist Level: Maximal Assistance - Patient 25 - 49%   Blood pressure 137/87, pulse 75, temperature 97.7 F (36.5 C), temperature source Oral, resp. rate 18, height 5' 2 (1.575 m), weight 70.3 kg, SpO2 99%.  Medical Problem List and Plan: 1. Functional deficits secondary to Lft ventral pontine CVA, likely small vessel disease             -patient may shower             -ELOS/Goals: 10-14 days,  Mod I to Sup PT/OT/SLP            -Continue CIR therapies including PT, OT --therapies today once EEG ended Team conference today please see physician documentation under team conference tab, met with team  to discuss problems,progress, and goals. Formulized individual treatment plan based on medical history, underlying problem and comorbidities.  2.  Antithrombotics: -DVT/anticoagulation:  Pharmaceutical: Lovenox              -antiplatelet therapy: DAPT X 3  weeks followed by ASA alone.  3. Pain Management: On Methadone  (followed by [facility]).             --now on prn oxycodone  due to shoulder/arm pain reduced to 1 tab oxy IR 5, pt asking for 2 tabs, discussed concerns for sedation ( also hx OUD)             --gabapentin  TID for neuropathy 4. H/o bipolar d/o/Mood/Behavior/Sleep: LCSW to follow for evaluation and support.              -2/3 psych recommended abilify  7.5mg  q am and lamictal  25mg  bid   -abilify  begins today, lamictal  started yesterday   -seems to be a bit more settled today   -continue to provide egosupport             -antipsychotic agents: N/A--has not been on any psych meds for years.  5. Neuropsych/cognition: This patient is capable of making decisions on her own behalf. 6. Skin/Wound Care: Routine pressure relief  measures. 7. Fluids/Electrolytes/Nutrition: Monitor I/O.    2/3 potassium 4.0, continue daily supplement     -bun a little better. Continue to encourage fluids 8. HTN: Monitor BP TID--continue Cozaar  and Norvasc .    Bp's currently tolerable 9. 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 Addison's disease: Now on Cortef  10 mg am/5 mg pm 11. Chronic pancreatitis: Has chronic issues with nausea --continue phenergan  --continue PPI 13. Constipation: Reports rectal bleeding-->colace added to senna due to hx of hems.   14. Left arm shoulder/elbow pain             -Xray elbow/shoulder 1/28 negative Suspect myofascial pain +/- frozen shoulder - improved shoulder ROM, still with upper trap trigger point -   2/3 pt received trigger point injection yesterday. ?improvement--was focused on other issues today 15. Marijuana use             -Advise cessation, associated with increased stroke risk 26-44% depending on study   -2/3 uds + yesterday for thc, benzos. Urine drug profile sent to [facility], pending 16. Hx of pituitary tumor             -f/u Neurosurgery 17. Low protein S             -f/u Hematology outpatient, will need retesting in ~35mo  18.  Hx sensorineural hearing loss with resultant dysarthria  19.  LUE numbness C5 distribution shoulder pain , ? Proximal weakness vs pain inhibition , only mild C4-5 stenosis on MRI  , MRI reviewed with Neuroradiology , no spinal cord signal abnormalities Neurosurg, Dr Dawley looked at MRI , C spine looks stable  Still has subjective numbess in left shoulder   20.  Insomnia- likely multifactorial, anxiety regarding medical condition, underlying anxiety d/o and on higher dose steroid taper - expect some improvement with steroid taper ,  - hydroxyzine  for anxiety  -did sleep last night 21.  Seizure like episode,  Prior hx but not well documented in medical record, recent Left pontine infarct (lower incidence of seizure for  this anatomic location vs cortical) also with empty sella syndrome with diagnosis of addison's disease , already on moderate dose Keppra -   -CT of head without changes  -EEG without sz activity so far  D/c LTM EEG per neurology  LOS: 7 days A FACE TO FACE EVALUATION WAS PERFORMED  Prentice FORBES [place] 05/08/2023, 7:57 AM    7

## 2023-05-08 NOTE — Progress Notes (Signed)
 Speech Language Pathology Weekly Progress and Session Note  Patient Details  Name: Brittany Mullins MRN: [id number] Date of Birth: 07/13/1980  Beginning of progress report period: May 02, 2023 End of progress report period: May 08, 2023   Short Term Goals: Week 1: SLP Short Term Goal 1 (Week 1): Patient will participate in a MBS to assess oropharyngeal swallowing function SLP Short Term Goal 1 - Progress (Week 1): Met SLP Short Term Goal 2 (Week 1): Patient will increase speech intelligibility to 100% at the sentence level given min multimodal A SLP Short Term Goal 2 - Progress (Week 1): Met SLP Short Term Goal 3 (Week 1): Patient will demonstrate problem solving abilities in mildly complex daily situations given min multimodal A SLP Short Term Goal 3 - Progress (Week 1): Met SLP Short Term Goal 4 (Week 1): Patient will recall and utilize memory strategies given min multimodal A SLP Short Term Goal 4 - Progress (Week 1): Met SLP Short Term Goal 5 (Week 1): Patient will sustain attention to tasks for 10 minutes at a time with min multimodal A SLP Short Term Goal 5 - Progress (Week 1): Met    New Short Term Goals: Week 2: SLP Short Term Goal 1 (Week 2): STG = LTG due to ELOS  Weekly Progress Updates: Pt has made good gains and has met 5 of 5 STGs this reporting period due to improved cognition, communication and swallowing. Currently, patient requires supervision A for cognitive and communication tasks. An MBS was completed this reporting period which showed functional oropharyngeal swallowing and recommended continuation of regular/thin diet. POC upgraded to reflect patients current progress. Pt/family eduction ongoing. Pt would benefit from continued ST intervention to maximize cognition and communication in order to maximize functional independence at d/c.    Intensity:   Frequency: 3 to 5 out of 7 days Duration/Length of Stay: 2/14 Treatment/Interventions:  Cognitive remediation/compensation;Dysphagia/aspiration precaution training;Internal/external aids;Speech/Language facilitation;Cueing hierarchy;Therapeutic Activities;Functional tasks;Patient/family education  Pain Pain Assessment Pain Scale: 0-10 Pain Score: 8  Faces Pain Scale: Hurts a little bit Pain Type: Acute pain Pain Location: Neck Pain Orientation: Right Pain Intervention(s): Medication (See eMAR)  Therapy/Group: Individual Therapy  Shamone Winzer M.A., CF-SLP 05/08/2023, 4:25 PM

## 2023-05-08 NOTE — Progress Notes (Signed)
 Occupational Therapy Session Note  Patient Details  Name: Brittany Mullins MRN: [id number] Date of Birth: [birth year]  Today's Date: 05/08/2023 OT Individual Time: 9261-9165 OT Individual Time Calculation (min): 56 min    Short Term Goals: Week 1:  OT Short Term Goal 1 (Week 1): pt will demonstrate improved use of L shoulder to don shirt with min A. OT Short Term Goal 2 (Week 1): Pt will be able to stand with min A to pull pants over hips. OT Short Term Goal 3 (Week 1): pt will be able to safely use RW to ambulate to toilet with mod A.  Skilled Therapeutic Interventions/Progress Updates:  Pt greeted semi reclined in bed eating breakfast, pt agreeable to OT intervention.      Transfers/bed mobility/functional mobility: pt completed supine>sit with elevated HOB with supervision. Sit>stand from EOB with RW and supervision. Pt completed ambulatory transfer into bathroom with RW and CGA fading to Supervision.   Pt completed functional ambulation = to household distance with RW and CGA, MIN verbal to increase stride distance and technique during swing in RLE.   Therapeutic activity: pt completed reciprocal stepping task as precursor to higher level functional mobility tasks with BUE support from RW. Pt completed x10 reps of stepping up/down with CGA. MIN verbal cues for sequencing and technique.    ADLs:  Grooming: pt completed seated grooming tasks at sink MODI, education provided on recommendation of sitting at home for grooming tasks for energy conservation. Pt describes a vanity at home to sit at.  UB dressing: pt donned [place] shirt and bra with supervision from w/c level  LB dressing: pt donned pants and underwear with supervision Footwear: pt donned socks via figure 4 with set- up assist.   Bathing: pt completed bathing both seated and standing from TTB with supervision, MIN cues needed for safety awareness as pt attempting to retrieve soap that had fallen in standing. Education  provided on recommendation of completing showers at home with supervision for safety precautions I.e dropping soap in shower.  Transfers: ambulatory ADL transfers with RW and CGA- supervision  Toileting: pt completed 3/3 toileting tasks with supervision, continent b/b void   Ended session with pt seated in w/c with all needs within reach and safety belt alarm activated.                    Therapy Documentation Precautions:  Precautions Precautions: Fall Restrictions Weight Bearing Restrictions Per Provider Order: No  Pain: 7/10 pain reported in neck and top part of shoulder rest breaks provided as needed.    Therapy/Group: Individual Therapy  [name] [facility] 05/08/2023, 11:59 AM

## 2023-05-08 NOTE — Progress Notes (Signed)
 Recreational Therapy Session Note  Patient Details  Name: Brittany Mullins MRN: [id number] Date of Birth: [birth year] Today's Date: 05/08/2023  Pain:no c/o, but c/o not feeling well, nursing & SLP at bedside  Pt participated in animal assisted activity bed level, easily engaging in conversataion with pet partner team with supervision.  Pt stated appreciation for this visit... this made my day.    Iraida Cragin 05/08/2023, 3:55 PM

## 2023-05-08 NOTE — Progress Notes (Signed)
 Speech Language Pathology Daily Session Note  Patient Details  Name: Brittany Mullins MRN: [id number] Date of Birth: 1980-09-24  Today's Date: 05/08/2023 SLP Individual Time: 1100-1120 SLP Individual Time Calculation (min): 20 min  Short Term Goals: Week 1: SLP Short Term Goal 1 (Week 1): Patient will participate in a MBS to assess oropharyngeal swallowing function SLP Short Term Goal 2 (Week 1): Patient will increase speech intelligibility to 100% at the sentence level given min multimodal A SLP Short Term Goal 3 (Week 1): Patient will demonstrate problem solving abilities in mildly complex daily situations given min multimodal A SLP Short Term Goal 4 (Week 1): Patient will recall and utilize memory strategies given min multimodal A SLP Short Term Goal 5 (Week 1): Patient will sustain attention to tasks for 10 minutes at a time with min multimodal A  Skilled Therapeutic Interventions: Upon entrance, RN administering medications whole in puree. No s/sx of aspiration, continue current regime. Patient with complaints consistent with GERD, SLP prompted patient to sit upright during/after meals and consume POs at a slow rate. Patient agreeable. SLP began targeting communication goals through use of EMST (expiratory muscle strength training) device. Patient with 10 repetitions before requesting to cease task. Patient reporting nausea. SLP offered sips of thin liquids and recommended sitting upright. Patient with episodes of emesis. Nursing aware. Remaining 40 minutes of session missed due to emesis and nausea. SLP will attempt to make up missed minutes as therapist/patient schedule allows. Patient left in bed with alarm set and call bell in reach. Continue POC.  Pain Emesis - Nursing Aware  Therapy/Group: Individual Therapy  Yoali Conry M.A., CF-SLP 05/08/2023, 7:41 AM

## 2023-05-08 NOTE — Evaluation (Signed)
 Recreational Therapy Assessment and Plan  Patient Details  Name: Brittany Mullins MRN: [id number] Date of Birth: Jul 22, 1980 Today's Date: 05/08/2023  Rehab Potential:  good ELOS:   d/c 2/14  Assessment   Hospital Problem: Principal Problem:   Left pontine stroke [facility])     Past Medical History:      Past Medical History:  Diagnosis Date   Addison's disease (HCC)     Brain tumor (benign) (HCC)     Chronic pancreatitis (HCC)     Hearing loss     Hypertension     Marijuana use      Per pt: medical marijuana patient   Pancreatitis     PCOS (polycystic ovarian syndrome)     Pituitary tumor     Seizures (HCC)          Past Surgical History:       Past Surgical History:  Procedure Laterality Date   ABDOMINAL HYSTERECTOMY       APPENDECTOMY       BIOPSY   08/16/2021    Procedure: BIOPSY;  Surgeon: San Sandor GAILS, DO;  Location: [facility] ENDOSCOPY;  Service: Gastroenterology;;   CESAREAN SECTION       CHOLECYSTECTOMY       ELBOW SURGERY       ESOPHAGOGASTRODUODENOSCOPY (EGD) WITH PROPOFOL  N/A 08/16/2021    Procedure: ESOPHAGOGASTRODUODENOSCOPY (EGD) WITH PROPOFOL ;  Surgeon: San Sandor GAILS, DO;  Location: [facility] ENDOSCOPY;  Service: Gastroenterology;  Laterality: N/A;   kidney stent       pancreatic stent       RADIOLOGY WITH ANESTHESIA N/A 04/25/2023    Procedure: MRI of Brain without contrast;  Surgeon: Radiologist, Medication, MD;  Location: [facility] OR;  Service: Radiology;  Laterality: N/A;          Assessment & Plan Clinical Impression: Patient is a 43 year old RH- female with Addison's disease, PCOS, pancreatitis with chronic N/V, seizures, PUD/duodenitis, chronic pain (abdominal due to pancreatitis)- on methadone , bipolar d/o, marijuana use  who was admitted on 04/22/23 with reports of right sided numbness that started day before as well as reports of abnormal gait with complaints of left calf pain per family.  UDS positive for THC. CTA head/neck was negative for  LVO. MRI brain done revealing acute paramedian left pontine infarct and ultrasound BLE dopplers were negative for DVT. She was noted to have slurred speech with intermittent aphasia and recommended DAPT X 3 weeks for stroke felt to be due to small vessel disease.    She has had issues with recurrent nausea as well as headaches. Orthostasis with hyperkalemia at admission treated with IVF. Patient without PCP and had stopped taking her prednisone  (didn't like how it made her feel) and question recent neurological follow up. Dr. Georjean recommended by Dr. Jerri. She was started on hydrocortisone  10 mg am/ 5 mg pm. Has required IV morphine  for HA as well as IV dilaudid  for post auricular pain with induration treated with warm compresses.  X ray of soft tissue neck done and was negative for mass Oxycodone  added to help manage pain.     She also reported Left shoulder and left elbow pain,  X rays ordered 01/28  and were negative for subluxation, fracture or dislocation. She reports prior dislocations of her L shoulder in the past.  She reported increase in dysesthesias on 01/27 and CT head repeated which was negative for extension or hemorrhage. Neurology reconsulted as boyfriend reported 2 staring episodes with brief episodes of  unresponsiveness followed by stiffness LUE 01/27 am question due to seizure. LT- EEG ordered which was negative for seizures and Keppra  increased to 750 mg TID per Dr. Rosemarie.    Low protein S noted. Neurology recommending referral to hematology.    Therapy has been working with patient who is limited by pain LUE, ataxic gait with decreased weight shifting to the right, right blurry vision,  right sided weakness and is requiring min to mod assist overall. She was independent PTA. CIR recommended due to functional decline.  Patient transferred to CIR on 05/01/2023.   Pt presents with decreased activity tolerance, decreased functional mobility, decreased balance, decreased coordination,  decreased awareness and decreased safety awareness, feelings of stress Limiting pt's independence with leisure/community pursuits.  Met with pt & boyfriend today to discuss TR services including leisure education, activity analysis/modifications and stress management.  Also discussed the importance of social, emotional, spiritual health in addition to physical health and their effects on overall health and wellness.  Pt stated understanding.   Plan  Min 1 TR session during LOS >20 minutes  Recommendations for other services: Neuropsych  Discharge Criteria: Patient will be discharged from TR if patient refuses treatment 3 consecutive times without medical reason.  If treatment goals not met, if there is a change in medical status, if patient makes no progress towards goals or if patient is discharged from hospital.  The above assessment, treatment plan, treatment alternatives and goals were discussed and mutually agreed upon: by patient  Reace Breshears 05/08/2023, 2:56 PM
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
## 2023-05-09 DIAGNOSIS — F1129 Opioid dependence with unspecified opioid-induced disorder: Secondary | ICD-10-CM | POA: Diagnosis not present

## 2023-05-09 DIAGNOSIS — G5621 Lesion of ulnar nerve, right upper limb: Secondary | ICD-10-CM | POA: Diagnosis not present

## 2023-05-09 DIAGNOSIS — M25512 Pain in left shoulder: Secondary | ICD-10-CM | POA: Diagnosis not present

## 2023-05-09 DIAGNOSIS — I639 Cerebral infarction, unspecified: Secondary | ICD-10-CM | POA: Diagnosis not present

## 2023-05-09 LAB — DRUG PROFILE, UR, 9 DRUGS (LABCORP)
Amphetamines, Urine: NEGATIVE ng/mL
Barbiturate, Ur: NEGATIVE ng/mL
Benzodiazepine Quant, Ur: NEGATIVE ng/mL
Cannabinoid Quant, Ur: POSITIVE ng/mL — AB
Cocaine (Metab.): NEGATIVE ng/mL
Methadone Screen, Urine: POSITIVE ng/mL — AB
Opiate Quant, Ur: NEGATIVE ng/mL
Phencyclidine, Ur: NEGATIVE ng/mL
Propoxyphene, Urine: NEGATIVE ng/mL

## 2023-05-09 LAB — BASIC METABOLIC PANEL
Anion gap: 11 (ref 5–15)
BUN: 18 mg/dL (ref 6–20)
CO2: 27 mmol/L (ref 22–32)
Calcium: 8.9 mg/dL (ref 8.9–10.3)
Chloride: 99 mmol/L (ref 98–111)
Creatinine, Ser: 0.77 mg/dL (ref 0.44–1.00)
GFR, Estimated: >60 mL/min (ref >60)
Glucose, Bld: 71 mg/dL (ref 70–99)
Potassium: 4 mmol/L (ref 3.5–5.1)
Sodium: 137 mmol/L (ref 135–145)

## 2023-05-09 LAB — CBC
HCT: 40.8 % (ref 36.0–46.0)
Hemoglobin: 13.9 g/dL (ref 12.0–15.0)
MCH: 31.2 pg (ref 26.0–34.0)
MCHC: 34.1 g/dL (ref 30.0–36.0)
MCV: 91.5 fL (ref 80.0–100.0)
Platelets: 330 10*3/uL (ref 150–400)
RBC: 4.46 MIL/uL (ref 3.87–5.11)
RDW: 12.6 % (ref 11.5–15.5)
WBC: 8.2 10*3/uL (ref 4.0–10.5)
nRBC: 0 % (ref 0.0–0.2)

## 2023-05-09 MED ORDER — LEVETIRACETAM 250 MG PO TABS
500.0000 mg | ORAL_TABLET | Freq: Three times a day (TID) | ORAL | Status: DC
  Administered 2023-05-09 – 2023-05-18 (×27): 500 mg via ORAL
  Filled 2023-05-09 (×27): qty 2

## 2023-05-09 NOTE — Progress Notes (Signed)
 Spoke with Brittany Mullins, Brittany Mullins complaining of neck and bilateral shoulder pain. She denies chest pain. She received PRN medication, we will continue to monitor. No falls reported, she had a MR on 05/03/2023, Dr Dawley reviewed the MR per Dr Carilyn note. Dr Carilyn note was reviewed. We will continue to monitor

## 2023-05-09 NOTE — Progress Notes (Signed)
 Occupational Therapy Weekly Progress Note  Patient Details  Name: Brittany Mullins MRN: [id number] Date of Birth: 06-20-1980  Beginning of progress report period: May 02, 2023 End of progress report period: May 09, 2023  Today's Date: 05/09/2023    Patient has met 3 of 3 short term goals. Pt making excellent progress during this reporting period with pt completing ADLs at overall CGA- supervision level. Pt completes ambulatory ADL transfers with RW and CGA- supervision. Pt continues to present with impaired balance and increased pain in L shoulder however pts AROM has improved since trigger point injection. Pts boyfriend was present for family ed on 2/6 and demonstrated appropriate level of assist for ADLs and functional mobility.   Patient continues to demonstrate the following deficits: muscle weakness and muscle joint tightness, impaired timing and sequencing, unbalanced muscle activation, and decreased coordination, delayed processing, and decreased standing balance, decreased postural control, and decreased balance strategies and therefore will continue to benefit from skilled OT intervention to enhance overall performance with BADL and iADL.  Patient progressing toward long term goals..  Plan of care revisions: LTGs upgraded from min A/CGA to supervision with BADLs and transfers. IADL goals of supervision with simple meal prep and light housekeeping added.   OT Short Term Goals Week 1:  OT Short Term Goal 1 (Week 1): pt will demonstrate improved use of L shoulder to don shirt with min A. OT Short Term Goal 1 - Progress (Week 1): Met OT Short Term Goal 2 (Week 1): Pt will be able to stand with min A to pull pants over hips. OT Short Term Goal 2 - Progress (Week 1): Met OT Short Term Goal 3 (Week 1): pt will be able to safely use RW to ambulate to toilet with mod A. OT Short Term Goal 3 - Progress (Week 1): Met Week 2:  OT Short Term Goal 1 (Week 2): STG= LTGs       Therapy Documentation Precautions:  Precautions Precautions: Fall Restrictions Weight Bearing Restrictions Per Provider Order: No    Therapy/Group: Individual Therapy  [name] [facility] 05/09/2023, 11:26 AM

## 2023-05-09 NOTE — Progress Notes (Addendum)
 Occupational Therapy Session Note  Patient Details  Name: Brittany Mullins MRN: [id number] Date of Birth: 03-Jun-1980  Today's Date: 05/09/2023 OT Individual Time: 9046-8895 OT Individual Time Calculation (min): 71 min    Short Term Goals: Week 1:  OT Short Term Goal 1 (Week 1): pt will demonstrate improved use of L shoulder to don shirt with min A. OT Short Term Goal 1 - Progress (Week 1): Met OT Short Term Goal 2 (Week 1): Pt will be able to stand with min A to pull pants over hips. OT Short Term Goal 2 - Progress (Week 1): Met OT Short Term Goal 3 (Week 1): pt will be able to safely use RW to ambulate to toilet with mod A. OT Short Term Goal 3 - Progress (Week 1): Met  Skilled Therapeutic Interventions/Progress Updates:  Pt greeted supine in bed, pt agreeable to OT intervention.    Pts boyfriend present during session.   Transfers/bed mobility/functional mobility: pt completed supine>sit with supervision. Sit>stand from EOB with RW and close supervision. Ambulatory transfer to bathroom with RW and close supervision.   ADLs:  Grooming: pt completed seated grooming at sink MODI, education provide to boyfriend on recommendation of using BSC in front of sink for grooming tasks as needed.  UB dressing:pt donned bra and [place] shirt with set- up assist from w/c  LB dressing: pt donned pants and underwear with close supervision for balance Footwear: pt donned socks with set -up assist   Bathing: pt completed bathing seated on TTB with supervision.  Transfers: ambulatory ADL transfers to toilet and walkin shower with RW and supervision, pt did have one LOB when turning to toilet needing MIN A to correct.  Toileting: pt completed 3/3 toileting tasks with supervision, continent urine void.    Education:   family education provided to [facility] on the below topics: -always using gait belt for functional mobility -recommendation on use of RW for functional ambulation - general assist  needed for ADLS and functional mobility ( I.e supervision ) -decreasing clutter/fall hazards in the home and general shower safety - discussed DME needs I.e bed rail , BSC and TTB -education provided on shower transfers to TTB with [name] able to assist pt in/out of shower with supervision. Doak also assisted pt with ambulatory transfer in apt to flat [facility] and couch, pt reports that she mostly sit in her bed at home.  -discussed modifications for IADLs such as laundry with plan to practice during later sessions.   Family demonstrated appropriate level of assist with ADLs and functional mobility     Ended session with pt seated in w/c with all needs within reach and safety belt alarm activated.                    Therapy Documentation Precautions:  Precautions Precautions: Fall Restrictions Weight Bearing Restrictions Per Provider Order: No  Pain: 6/10 pain reported in neck/shoulders, pain meds provided     Therapy/Group: Individual Therapy  [name] [facility] 05/09/2023, 12:15 PM

## 2023-05-09 NOTE — Progress Notes (Signed)
 PROGRESS NOTE   Subjective/Complaints:  Per epileptologist may reduce Keppra  to 500mg  TID N and V x 2 yesterday , feels better today , takes phenergan  at home for this compazine  is ordered in hospital Denies constipation  ROS: Limited due to cognitive/behavioral   Objective:   No results found.  Recent Labs    05/09/23 0548  WBC 8.2  HGB 13.9  HCT 40.8  PLT 330   Recent Labs    05/09/23 0548  NA 137  K 4.0  CL 99  CO2 27  GLUCOSE 71  BUN 18  CREATININE 0.77  CALCIUM  8.9    Intake/Output Summary (Last 24 hours) at 05/09/2023 0809 Last data filed at 05/09/2023 0804 Gross per 24 hour  Intake 237 ml  Output --  Net 237 ml        Physical Exam: Vital Signs Blood pressure 138/85, pulse 72, temperature 98 F (36.7 C), resp. rate 18, height 5' 2 (1.575 m), weight 70.3 kg, SpO2 98%.    General: No acute distress Mood and affect are appropriate Heart: Regular rate and rhythm no rubs murmurs or extra sounds Lungs: Clear to auscultation, breathing unlabored, no rales or wheezes Abdomen: Positive bowel sounds, soft nontender to palpation, nondistended Extremities: No clubbing, cyanosis, or edema Skin: No evidence of breakdown, no evidence of rash   Skin: No evidence of breakdown, no evidence of rash Neurologic: Cranial nerves II through XII intact, motor strength is 3- /5 in bilateral deltoid, bicep, tricep,RIght  grip, 5/5 Left grip, 5/5 left and 4/5 right hip flexor, knee extensors, ankle dorsiflexor and plantar flexor Some pain inhibition LUE  Sensory examreduced LT sensation RIght 4th and 5th digit, left shoulder  Cerebellar exam limited by strength on right and pain on left  Musculoskeletal: Full range of motion in all 4 extremities. No joint swelling Moving Left shoulder better Trigger point Left upper trap   Assessment/Plan: 1. Functional deficits which require 3+ hours per day of  interdisciplinary therapy in a comprehensive inpatient rehab setting. Physiatrist is providing close team supervision and 24 hour management of active medical problems listed below. Physiatrist and rehab team continue to assess barriers to discharge/monitor patient progress toward functional and medical goals  Care Tool:  Bathing    Body parts bathed by patient: Abdomen, Chest, Front perineal area, Right upper leg, Left upper leg, Left lower leg, Face, Left arm, Right arm, Buttocks, Right lower leg   Body parts bathed by helper: Right lower leg, Buttocks, Right arm, Left arm     Bathing assist Assist Level: Supervision/Verbal cueing     Upper Body Dressing/Undressing Upper body dressing   What is the patient wearing?: Bra, Pull over shirt    Upper body assist Assist Level: Independent with assistive device    Lower Body Dressing/Undressing Lower body dressing      What is the patient wearing?: Underwear/pull up, Pants     Lower body assist Assist for lower body dressing: Supervision/Verbal cueing     Toileting Toileting    Toileting assist Assist for toileting: Supervision/Verbal cueing     Transfers Chair/bed transfer  Transfers assist     Chair/bed transfer assist level: Contact  Guard/Touching assist     Locomotion Ambulation   Ambulation assist      Assist level: Minimal Assistance - Patient > 75% Assistive device: Walker-rolling Max distance: 20   Walk 10 feet activity   Assist     Assist level: Minimal Assistance - Patient > 75% Assistive device: Walker-rolling, Orthosis   Walk 50 feet activity   Assist Walk 50 feet with 2 turns activity did not occur: Safety/medical concerns         Walk 150 feet activity   Assist Walk 150 feet activity did not occur: Safety/medical concerns         Walk 10 feet on uneven surface  activity   Assist Walk 10 feet on uneven surfaces activity did not occur: Safety/medical concerns          Wheelchair     Assist Is the patient using a wheelchair?: Yes Type of Wheelchair: Manual    Wheelchair assist level: Supervision/Verbal cueing Max wheelchair distance: 100    Wheelchair 50 feet with 2 turns activity    Assist        Assist Level: Supervision/Verbal cueing   Wheelchair 150 feet activity     Assist      Assist Level: Maximal Assistance - Patient 25 - 49%   Blood pressure 138/85, pulse 72, temperature 98 F (36.7 C), resp. rate 18, height 5' 2 (1.575 m), weight 70.3 kg, SpO2 98%.  Medical Problem List and Plan: 1. Functional deficits secondary to Lft ventral pontine CVA, likely small vessel disease             -patient may shower             -ELOS/Goals: 2/14   Mod I to Sup PT/OT/SLP            -Continue CIR therapies including PT, OT  2.  Antithrombotics: -DVT/anticoagulation:  Pharmaceutical: Lovenox              -antiplatelet therapy: DAPT X 3 weeks followed by ASA alone.  3. Pain Management: On Methadone  (followed by [facility]).             --now on prn oxycodone  due to shoulder/arm pain reduced to 1 tab oxy IR 5, pt asking for 2 tabs, discussed concerns for sedation ( also hx OUD)             --gabapentin  TID for neuropathy 4. H/o bipolar d/o/Mood/Behavior/Sleep: LCSW to follow for evaluation and support.              -2/3 psych recommended abilify  7.5mg  q am and lamictal  25mg  bid   -abilify  begins today, lamictal  started yesterday   a bit more settled   -continue to provide egosupport             -antipsychotic agents: N/A--has not been on any psych meds for years.  5. Neuropsych/cognition: This patient is capable of making decisions on her own behalf. 6. Skin/Wound Care: Routine pressure relief measures. 7. Fluids/Electrolytes/Nutrition: Monitor I/O.    2/3 potassium 4.0, continue daily supplement     -bun a little better. Continue to encourage fluids 8. HTN: Monitor BP TID--continue Cozaar  and Norvasc .    Bp's currently  tolerable 9. 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 Addison's disease: Now on Cortef  10 mg am/5 mg pm 11. Chronic pancreatitis: Has chronic issues with nausea --continue phenergan  --continue PPI Vomiting yesterday abd exam normal  BM this am  Possibility of new meds causing nausea  13. Constipation: Reports rectal bleeding-->colace added to senna due to hx of hems.   14. Left arm shoulder/elbow pain             -Xray elbow/shoulder 1/28 negative Suspect myofascial pain +/- frozen shoulder - improved shoulder ROM, still with upper trap trigger point -   2/3 pt received trigger point injection yesterday. ?improvement--was focused on other issues today 15. Marijuana use             -Advise cessation, associated with increased stroke risk 26-44% depending on study   -2/3 uds + yesterday for thc, benzos. Urine drug profile sent to [facility], pending 16. Hx of pituitary tumor             -f/u Neurosurgery 17. Low protein S             -f/u Hematology outpatient, will need retesting in ~62mo  18.  Hx sensorineural hearing loss with resultant dysarthria  19.  LUE numbness C5 distribution shoulder pain , ? Proximal weakness vs pain inhibition , only mild C4-5 stenosis on MRI  , MRI reviewed with Neuroradiology , no spinal cord signal abnormalities Neurosurg, Dr Dawley looked at MRI , C spine looks stable  Still has subjective numbess in left shoulder   20.  Insomnia- likely multifactorial, anxiety regarding medical condition, underlying anxiety d/o and on higher dose steroid taper - expect some improvement with steroid taper ,  - hydroxyzine  for anxiety  -did sleep last night 21.  Seizure like episode,  Prior hx but not well documented in medical record, recent Left pontine infarct (lower incidence of seizure for this anatomic location vs cortical) also with empty sella syndrome with diagnosis of addison's disease , already on moderate dose Keppra -   -CT of  head without changes  -EEG without sz activity   D/c LTM EEG per neurology  LOS: 8 days A FACE TO FACE EVALUATION WAS PERFORMED  Brittany Mullins 05/09/2023, 8:09 AM    7

## 2023-05-09 NOTE — Progress Notes (Signed)
 Physical Therapy Session Note  Patient Details  Name: Brittany Mullins MRN: [id number] Date of Birth: 08/19/1980  Today's Date: 05/09/2023 PT Individual Time: 9195-9081; 1308 - 1405 PT Individual Time Calculation (min): 74 min; 57 min   Short Term Goals: Week 1:  PT Short Term Goal 1 (Week 1): Pt wil perform bed mobilty from flat surface with min assist to the L to simulate home environment PT Short Term Goal 2 (Week 1): Pt will perform transfers with min assist PT Short Term Goal 3 (Week 1): Pt will perform 4 steps with min assist PT Short Term Goal 4 (Week 1): Pt will be able to gait x 50' with min assist  SESSION 1 Skilled Therapeutic Interventions/Progress Updates: Patient supine in bed on entrance to room. Patient alert and agreeable to PT session.   Patient reported 7-8/10 pain in L neck/upper trap musculature (decreased to 6/10 following STM and stretches noted below).  Therapeutic Activity: Bed Mobility: Pt performed supine<>sit on EOB with supervision (HOB elevated).  Transfers: Pt performed sit<>stand transfers throughout session with no AD and with CGA for safety. Provided VC for pt to avoid reaching out of BOS to arm rest on WC.  Gait Training:  Pt ambulated 44' using RW with light CGA/close supervision for safety. Pt demonstrated the following gait deviations with therapist providing the described cuing and facilitation for improvement:  - Decreased heel strike bilaterally (R < L). Pt unable to touch R heel to floor throughout gait cycle due to tight heel cords  - Leads to B knees in flexion with forward flexed posture  - Decreased step clearance B LE and decreased cadence - Pt required rest break due to pain in B LE's  Trial 2 of roughly 100' around main gym and hallway. Pt with initiate VC to place entire foot on floor. Pt with improved posture with neutral dorsiflexion and B knees/hip closer to extension than 1st trial (performed following therex and STM of B  plantarflexors). Pt with seated rest break, and reported decrease in pain in plantarflexors.   Stair Navigation: Pt ascended/descended 4 (6) steps with B UE support and with step to pattern and with CGA throughout (performed following therex and STM).   Therapeutic Exercise: Pt performed the following exercises with therapist providing the described cuing and facilitation for improvement. - B lateral neck flexors stretch 2 x 10s - B levator scapula stretch 2 x 10s  - B soleus stretch with pt sitting in WC with balance board underneath and VC to push back to dorsiflexion right before toes lift from board, and back to plantarflexion to rest. 10 x 10s holds (gentle) - B gastrocnemius stretch with B UE support standing at stairs. 3 x 10s stretch. Pt cued to maintain hip in extension vs forward flexed.  - B heel raises (from neutral stance on floor) with B UE support. 2 x 10 VC for eccentric focus and maintaining upright posture.   Manual Therapy: Palpation of B upper trap musculature performed with trigger points noted. Education and rationale provided with pt agreeing to participate in intervention. - Trigger point release to L upper trap musculature/rhomboids with soft tissue mobilization to follow throughout. Increased time required to adjust to pt's tolerance to trigger point release (performed throughout session to pts tolerance) STM to B plantarflexors (gastrocnemius).  Patient supine in bed at end of session with brakes locked, bed alarm set, and all needs within reach.  SESSION 2 Skilled Therapeutic Interventions/Progress Updates: Patient supine in bed on  entrance to room. Patient alert and agreeable to PT session.   Patient reported having another seizure onset during lunch with boyfriend present, and nsg shortly after. Pt reported feeling up for doing as much as able during session, and also reported some fatigue.  Boyfriend, [name], has been signed off to assist pt with transfers in  room (stand pivot with no AD), and RW to bathroom with all safety precautions and education addressed appropriately.  Pt and Boyfriend further educated to perform sensory grounding technique when onset of seizure-like activity onsets.  Therapeutic Activity: Bed Mobility: Pt performed supine<sit on EOB with supervision (HOB elevated). VC required for pt to transition slowly. Pt sitting EOB and donned B shoes with supervision and increased time to tie shoe laces. Pt educated to avoid increased time during forward flexed posture while donning as to avoid lightheadedness (pt present with it after and required head rest on end of bed railing - rebounded with short rest and deep breathing).   Transfers: Pt performed sit<>stand transfers throughout session with close supervision with RW, and CGA without AD. Provided VC for to avoid reaching outside BOS.  - Boyfriend, [name], guided through transfers from EOB<>WC, and WC<recliner with pt and with safe technique (HHA and use of gait belt appropriately).  Gait Training:  Pt ambulated 300'+ on uneven surfaces outside of WCC using RW with close supervision/light CGA. Pt with VC throughout to increase heel strike bilaterally and for hand placement. Good carryover noted from 1st  session with increase in B knee extension and B dorsiflexion to allow for upright posture and heel-strike. Pt required 3 seated rest breaks, one due to pt having increase in vertigo per pt report. Pt sat to [facility] and presented with B eyes closing and slowly extending back to back of chair. Pt guided through sensory grounding technique with pt's presentation rebounding away from episode (pt reported same symptoms of seizures occurred at that time, and that grounding technique helped).  Stair Navigation: Ascended/descended 4 (6) steps x 2 with rest break between. Pt with B UE support and close supervision throughout. Pt provided with VC to ascend with L LE, and descend with R LE as R  heel-cord prevents adequate dorsiflexion to descend safely.  Therapeutic Exercise: Pt performed the following exercises with therapist providing the described cuing and facilitation for improvement. - Pt and pt boyfriend guided through plantarflexion with yellow resistance band (emphasis on eccentric control) following gentle massage/stretch of B calves once a night to decrease heel cord tightness (R>L)  Patient sitting in recliner at end of session with brakes locked, boyfriend present and all needs within reach.       Therapy Documentation Precautions:  Precautions Precautions: Fall Restrictions Weight Bearing Restrictions Per Provider Order: No  Therapy/Group: Individual Therapy  Delano Scardino PTA 05/09/2023, 12:20 PM

## 2023-05-09 NOTE — Progress Notes (Signed)
   05/09/23 1244  Seizure Activity  Psychomotor Symptoms Behavior pause  Motor Component Right;Left;Hand;Twitching  Duration 2 minutes  Post-ictal Other (Comment) (None)  Interventions Seizure Pads;Suction;Notified MD/Provider  Tasking (Response) Aware of seizure   The patient's significant other alerted staff of impending seizure. The nurse went to the room to assess and noted patient eyes closed, bilateral hands drawn and twitching for 2 minutes. The significant other asked for an alcohol  pad to wave under her nose. Once that was done, the patient was verbally responsive. Provider notified and vitals were taken. Patient resting in bed with seizure pads on bed. Significant other at bedside.

## 2023-05-09 NOTE — Progress Notes (Signed)
 Patient ID: Brittany Mullins, female   DOB: 05-05-1980, 43 y.o.   MRN: [id number]  1513- SW returned phone call to pt s/o Brittany Mullins to inform on updates from team conference, and d/c date 2/14. He was asking about best plan of care. SW shared the biggest concern was if stairs are safe for patient. SW shared will discuss with medical team if stairs are safe after her practicing today. Fam edu on Wedn [name], MSW, LCSW Office: [phone] Cell: [phone] Fax: [phone]

## 2023-05-10 DIAGNOSIS — M25512 Pain in left shoulder: Secondary | ICD-10-CM | POA: Diagnosis not present

## 2023-05-10 DIAGNOSIS — I639 Cerebral infarction, unspecified: Secondary | ICD-10-CM | POA: Diagnosis not present

## 2023-05-10 DIAGNOSIS — G5621 Lesion of ulnar nerve, right upper limb: Secondary | ICD-10-CM | POA: Diagnosis not present

## 2023-05-10 DIAGNOSIS — F1129 Opioid dependence with unspecified opioid-induced disorder: Secondary | ICD-10-CM | POA: Diagnosis not present

## 2023-05-10 NOTE — Progress Notes (Signed)
 Speech Language Pathology Daily Session Note  Patient Details  Name: Tynia Wiers MRN: [id number] Date of Birth: 05/29/1980  Today's Date: 05/10/2023 SLP Individual Time: 1345-1444 SLP Individual Time Calculation (min): 59 min  Short Term Goals: Week 2: SLP Short Term Goal 1 (Week 2): STG = LTG due to ELOS  Skilled Therapeutic Interventions: Skilled therapy session focused on communication and cognitive goals. SLP faciliated session through providing mod i A during respiratory strengthening exercises. SLP re-tested patients maximum expiratory pressure. Patient with a MEP of 50 cm H2O this date, a significant improvement from initial evaluation. SLP set expiratory muscle strength training (EMST) device at 35 cm H2O and patient complete x10 repetitions. SLP then encouraged completion of sustained phonation. Patient with an average of 10.7 seconds given modi A. Finally, SLP introduced box breathing technique. Patient completed with mod iA. At the latter half of the session, SLP targeted cognitive goals. Patient utilized budget created in prior session to find hotels, restaurant and activities within price range. Patient completed activity with mod i A. Patient left in bed at end of session with call bell in reach and alarm set. Continue POC.  Pain None reported   Therapy/Group: Individual Therapy  Sherly Brodbeck M.A., CF-SLP 05/10/2023, 7:49 AM

## 2023-05-10 NOTE — Progress Notes (Signed)
 Physical Therapy Weekly Progress Note  Patient Details  Name: Brittany Mullins MRN: [id number] Date of Birth: 11-13-1980  Beginning of progress report period: May 02, 2023 End of progress report period: May 10, 2023  Patient has met 4 of 4 short term goals. Pt is making functional progress towards LTG's by demonstrating surpassing goals set forth this past week. Pt currently is supervision with bed mobility, supervision with transfers in RW, and CGA with no AD, supervision with navigating steps with one UE supported on [facility] to simulate home environment, and ambulating 200' with supervision throughout in [place]. Pt had some set back by missing therapy session shortly after evaluation due to seizure-like episodes that was found to be due to high anxiety. Pt guided through sensory grounding technique when episode starts (pt aware of beginning symptoms) which decreases onset of symptoms. Pt goals will need to be adjust accordingly due to increase in activity tolerance and physical function.   Patient continues to demonstrate the following deficits muscle weakness and muscle joint tightness, decreased cardiorespiratoy endurance, decreased coordination, decreased safety awareness, and decreased sitting balance, decreased standing balance, and decreased balance strategies and therefore will continue to benefit from skilled PT intervention to increase functional independence with mobility.  Patient progressing toward long term goals..  Plan of care revisions: upgraded LTGs to reflect improvements with mobility.  PT Short Term Goals Week 1:  PT Short Term Goal 1 (Week 1): Pt wil perform bed mobilty from flat surface with min assist to the L to simulate home environment PT Short Term Goal 1 - Progress (Week 1): Met PT Short Term Goal 2 (Week 1): Pt will perform transfers with min assist PT Short Term Goal 2 - Progress (Week 1): Met PT Short Term Goal 3 (Week 1): Pt will perform 4 steps with min  assist PT Short Term Goal 3 - Progress (Week 1): Met PT Short Term Goal 4 (Week 1): Pt will be able to gait x 50' with min assist PT Short Term Goal 4 - Progress (Week 1): Met Week2: STG = LTGs    Therapy Documentation Precautions:  Precautions Precautions: Fall Restrictions Weight Bearing Restrictions Per Provider Order: No  Dominic Sandoval PTA  05/10/2023, 4:18 PM  Donald WENDI Pereyra, PT, DPT, CBIS 05/12/23 8:51 PM

## 2023-05-10 NOTE — Progress Notes (Signed)
 Occupational Therapy Session Note  Patient Details  Name: Brittany Mullins MRN: [id number] Date of Birth: March 01, 1981  Today's Date: 05/10/2023 OT Individual Time: 1000-1030 OT Individual Time Calculation (min): 30 min    Short Term Goals: Week 1:  OT Short Term Goal 1 (Week 1): pt will demonstrate improved use of L shoulder to don shirt with min A. OT Short Term Goal 1 - Progress (Week 1): Met OT Short Term Goal 2 (Week 1): Pt will be able to stand with min A to pull pants over hips. OT Short Term Goal 2 - Progress (Week 1): Met OT Short Term Goal 3 (Week 1): pt will be able to safely use RW to ambulate to toilet with mod A. OT Short Term Goal 3 - Progress (Week 1): Met Week 2:  OT Short Term Goal 1 (Week 2): STG= LTGs  Skilled Therapeutic Interventions/Progress Updates:    Pt received in w/c eager to take a quick shower.  Pt used RW to ambulate to toilet and then to shower. Completed all ADLs with supervision to distant supervision only in a time efficient manner. LUE moving functionally.  Discussed with pt upgrading her goals to Mod I and adding in IADL goals as her boyfriends will be at work during the day.   Pt taken to OT group for her next session.   Therapy Documentation Precautions:  Precautions Precautions: Fall Restrictions Weight Bearing Restrictions Per Provider Order: No  Pain:  No c/o pain  ADL: ADL Eating: Independent Grooming: Modified independent Where Assessed-Grooming: Sitting at sink Upper Body Bathing: Modified independent Where Assessed-Upper Body Bathing: Shower Lower Body Bathing: Supervision/safety Where Assessed-Lower Body Bathing: Shower Upper Body Dressing: Modified independent (Device) Where Assessed-Upper Body Dressing: Wheelchair Lower Body Dressing: Supervision/safety Where Assessed-Lower Body Dressing: Wheelchair Toileting: Supervision/safety Where Assessed-Toileting: Teacher, Adult Education: Close supervision Statistician  Method: Proofreader: Acupuncturist: Close supervision Film/video Editor Method: Designer, Industrial/product: Emergency planning/management officer, Grab bars   Therapy/Group: Individual Therapy  Consuelo Suthers 05/10/2023, 12:33 PM

## 2023-05-10 NOTE — Plan of Care (Signed)
  Problem: RH Balance Goal: LTG: Patient will maintain dynamic sitting balance (OT) Description: LTG:  Patient will maintain dynamic sitting balance with assistance during activities of daily living (OT) Flowsheets (Taken 05/10/2023 1237) LTG: Pt will maintain dynamic sitting balance during ADLs with: (LTG upgraded due to progress.) Independent Note: LTG upgraded due to progress.  Goal: LTG Patient will maintain dynamic standing with ADLs (OT) Description: LTG:  Patient will maintain dynamic standing balance with assist during activities of daily living (OT)  Flowsheets (Taken 05/10/2023 1237) LTG: Pt will maintain dynamic standing balance during ADLs with: (LTG upgraded due to progress.) Independent with assistive device Note: LTG upgraded due to progress.    Problem: Sit to Stand Goal: LTG:  Patient will perform sit to stand in prep for activites of daily living with assistance level (OT) Description: LTG:  Patient will perform sit to stand in prep for activites of daily living with assistance level (OT) Flowsheets (Taken 05/10/2023 1237) LTG: PT will perform sit to stand in prep for activites of daily living with assistance level: (LTG upgraded due to progress.) Independent Note: LTG upgraded due to progress.    Problem: RH Bathing Goal: LTG Patient will bathe all body parts with assist levels (OT) Description: LTG: Patient will bathe all body parts with assist levels (OT) Flowsheets (Taken 05/10/2023 1237) LTG: Pt will perform bathing with assistance level/cueing: (LTG upgraded due to progress.) Independent with assistive device  Note: LTG upgraded due to progress.    Problem: RH Dressing Goal: LTG Patient will perform lower body dressing w/assist (OT) Description: LTG: Patient will perform lower body dressing with assist, with/without cues in positioning using equipment (OT) Flowsheets (Taken 05/10/2023 1237) LTG: Pt will perform lower body dressing with assistance level of: (LTG upgraded  due to progress.) Independent with assistive device Note: LTG upgraded due to progress.    Problem: RH Toileting Goal: LTG Patient will perform toileting task (3/3 steps) with assistance level (OT) Description: LTG: Patient will perform toileting task (3/3 steps) with assistance level (OT)  Flowsheets (Taken 05/10/2023 1237) LTG: Pt will perform toileting task (3/3 steps) with assistance level: (LTG upgraded due to progress.) Independent with assistive device Note: LTG upgraded due to progress.    Problem: RH Toilet Transfers Goal: LTG Patient will perform toilet transfers w/assist (OT) Description: LTG: Patient will perform toilet transfers with assist, with/without cues using equipment (OT) Flowsheets (Taken 05/10/2023 1237) LTG: Pt will perform toilet transfers with assistance level of: (LTG upgraded due to progress.) Independent with assistive device Note: LTG upgraded due to progress.

## 2023-05-10 NOTE — Progress Notes (Addendum)
 Physical Therapy Session Note  Patient Details  Name: Brittany Mullins MRN: [id number] Date of Birth: [birth year]  Today's Date: 05/10/2023 PT Individual Time: 9154-9069 PT Individual Time Calculation (min): 45 min   Short Term Goals: Week 1:  PT Short Term Goal 1 (Week 1): Pt wil perform bed mobilty from flat surface with min assist to the L to simulate home environment PT Short Term Goal 2 (Week 1): Pt will perform transfers with min assist PT Short Term Goal 3 (Week 1): Pt will perform 4 steps with min assist PT Short Term Goal 4 (Week 1): Pt will be able to gait x 50' with min assist  Skilled Therapeutic Interventions/Progress Updates: Patient supine in bed on entrance to room. Patient alert and agreeable to PT session.   Patient reported unrated pain/tightness in upper chest/B shoulders around upper neck musculature. Pt reported having a pseudo-seizure episode the previous day after therapy. Significant other was present and pt alerted him of onset. Significant other and pt recalled education provided by PTA to perform sensory grounding technique which greatly assisted at preventing onset.   Therapeutic Activity: Bed Mobility: Pt performed supine<sit on EOB with supervision.  Transfers: Pt performed sit<>stand transfers throughout session with RW and with supervision. Provided VC for hand placement and safe RW proximity.  Gait Training:  Pt ambulated 200' x 1 using RW with supervision. Pt with good carry over from previous session with heel-strike and decreased B knee/hip flexion (still present to some degree, but not as prominent). Pt reported increased endurance to continue, but pt informed to rest to conserve energy for stair navigation.  Therapeutic Exercise: Pt performed the following exercises with therapist providing the described cuing and facilitation for improvement. - Pt on balance board (short sitting) performing dorsiflexion/plantarflexion with instructions to  hold stretch into dorsiflexion for heel-cord stretch (10s holds).  Stair Navigation: Pt ascended/descended 4(6) steps x 6 (total of 24 steps) with use of only LHR for home environment simulation. Pt performed with supervision and VC to ensure entire foot is placed on step prior to advancing, and to perform step to to conserve energy. Pt recalled previous cuing to lead with L LE when ascending, and descend with R LE leading (due to decreased dorsiflexion required).   Patient sitting in WC at end of session with brakes locked, belt alarm set, and all needs within reach.      Therapy Documentation Precautions:  Precautions Precautions: Fall Restrictions Weight Bearing Restrictions Per Provider Order: No   Therapy/Group: Individual Therapy  Idalie Canto PTA 05/10/2023, 12:57 PM

## 2023-05-10 NOTE — Progress Notes (Signed)
 PROGRESS NOTE   Subjective/Complaints:  Soreness in chest and shoulders, discussed walker use yesterday   ROS: no SOB, neg , N/V/D  Objective:   No results found.  Recent Labs    05/09/23 0548  WBC 8.2  HGB 13.9  HCT 40.8  PLT 330   Recent Labs    05/09/23 0548  NA 137  K 4.0  CL 99  CO2 27  GLUCOSE 71  BUN 18  CREATININE 0.77  CALCIUM  8.9    Intake/Output Summary (Last 24 hours) at 05/10/2023 9191 Last data filed at 05/10/2023 0618 Gross per 24 hour  Intake 360 ml  Output --  Net 360 ml        Physical Exam: Vital Signs Blood pressure 111/64, pulse 64, temperature 98.2 F (36.8 C), temperature source Oral, resp. rate 20, height 5' 2 (1.575 m), weight 70.3 kg, SpO2 97%.    General: No acute distress Mood and affect are appropriate Heart: Regular rate and rhythm no rubs murmurs or extra sounds Lungs: Clear to auscultation, breathing unlabored, no rales or wheezes Abdomen: Positive bowel sounds, soft nontender to palpation, nondistended Extremities: No clubbing, cyanosis, or edema Skin: No evidence of breakdown, no evidence of rash   Skin: No evidence of breakdown, no evidence of rash Neurologic: Cranial nerves II through XII intact, motor strength is 3- /5 in bilateral deltoid, bicep, tricep,RIght  grip, 5/5 Left grip, 5/5 left and 4/5 right hip flexor, knee extensors, ankle dorsiflexor and plantar flexor Some pain inhibition LUE  Sensory examreduced LT sensation RIght 4th and 5th digit, left shoulder  Cerebellar exam limited by strength on right and pain on left  Musculoskeletal: Full range of motion in all 4 extremities. No joint swelling  Trigger point Left and right upper trap , tender over the pecs   Assessment/Plan: 1. Functional deficits which require 3+ hours per day of interdisciplinary therapy in a comprehensive inpatient rehab setting. Physiatrist is providing close team supervision  and 24 hour management of active medical problems listed below. Physiatrist and rehab team continue to assess barriers to discharge/monitor patient progress toward functional and medical goals  Care Tool:  Bathing    Body parts bathed by patient: Abdomen, Chest, Front perineal area, Right upper leg, Left upper leg, Left lower leg, Face, Left arm, Right arm, Buttocks, Right lower leg   Body parts bathed by helper: Right lower leg, Buttocks, Right arm, Left arm     Bathing assist Assist Level: Supervision/Verbal cueing     Upper Body Dressing/Undressing Upper body dressing   What is the patient wearing?: Bra, Pull over shirt    Upper body assist Assist Level: Independent with assistive device    Lower Body Dressing/Undressing Lower body dressing      What is the patient wearing?: Underwear/pull up, Pants     Lower body assist Assist for lower body dressing: Supervision/Verbal cueing     Toileting Toileting    Toileting assist Assist for toileting: Supervision/Verbal cueing     Transfers Chair/bed transfer  Transfers assist     Chair/bed transfer assist level: Contact Guard/Touching assist     Locomotion Ambulation   Ambulation assist  Assist level: Minimal Assistance - Patient > 75% Assistive device: Walker-rolling Max distance: 20   Walk 10 feet activity   Assist     Assist level: Minimal Assistance - Patient > 75% Assistive device: Walker-rolling, Orthosis   Walk 50 feet activity   Assist Walk 50 feet with 2 turns activity did not occur: Safety/medical concerns         Walk 150 feet activity   Assist Walk 150 feet activity did not occur: Safety/medical concerns         Walk 10 feet on uneven surface  activity   Assist Walk 10 feet on uneven surfaces activity did not occur: Safety/medical concerns         Wheelchair     Assist Is the patient using a wheelchair?: Yes Type of Wheelchair: Manual    Wheelchair  assist level: Supervision/Verbal cueing Max wheelchair distance: 100    Wheelchair 50 feet with 2 turns activity    Assist        Assist Level: Supervision/Verbal cueing   Wheelchair 150 feet activity     Assist      Assist Level: Maximal Assistance - Patient 25 - 49%   Blood pressure 111/64, pulse 64, temperature 98.2 F (36.8 C), temperature source Oral, resp. rate 20, height 5' 2 (1.575 m), weight 70.3 kg, SpO2 97%.  Medical Problem List and Plan: 1. Functional deficits secondary to Lft ventral pontine CVA, likely small vessel disease             -patient may shower             -ELOS/Goals: 2/14   Mod I to Sup PT/OT/SLP            -Continue CIR therapies including PT, OT  2.  Antithrombotics: -DVT/anticoagulation:  Pharmaceutical: Lovenox              -antiplatelet therapy: DAPT X 3 weeks followed by ASA alone.  3. Pain Management: On Methadone  (followed by [facility]).             --now on prn oxycodone  due to shoulder/arm pain reduced to 1 tab oxy IR 5, pt asking for 2 tabs, discussed concerns for sedation ( also hx OUD)             --gabapentin  TID for neuropathy 4. H/o bipolar d/o/Mood/Behavior/Sleep: LCSW to follow for evaluation and support.              -2/3 psych recommended abilify  7.5mg  q am and lamictal  25mg  bid   -abilify  begins today, lamictal  started yesterday   a bit more settled   -continue to provide egosupport             -antipsychotic agents: N/A--has not been on any psych meds for years.  5. Neuropsych/cognition: This patient is capable of making decisions on her own behalf. 6. Skin/Wound Care: Routine pressure relief measures. 7. Fluids/Electrolytes/Nutrition: Monitor I/O.    2/3 potassium 4.0, continue daily supplement     -bun a little better. Continue to encourage fluids 8. HTN: Monitor BP TID--continue Cozaar  and Norvasc .    Bp's currently tolerable 9. 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  Addison's disease: Now on Cortef  10 mg am/5 mg pm 11. Chronic pancreatitis: Has chronic issues with nausea --continue phenergan  --continue PPI Vomiting yesterday abd exam normal  BM this am  Possibility of new meds causing nausea  13. Constipation: Reports rectal bleeding-->colace added  to senna due to hx of hems.   14. Left arm shoulder/elbow pain             -Xray elbow/shoulder 1/28 negative Suspect myofascial pain +/- frozen shoulder - improved shoulder ROM, still with upper trap trigger point -   2/3 pt received trigger point injection yesterday. ?improvement--was focused on other issues today Has pec and trap soreness with increased walker use , MSK chest pain trat symptomatically  15. Marijuana use             -Advise cessation, associated with increased stroke risk 26-44% depending on study   -2/3 uds + yesterday for thc, benzos. Urine drug profile sent to [facility], pending 16. Hx of pituitary tumor             -f/u Neurosurgery 17. Low protein S             -f/u Hematology outpatient, will need retesting in ~31mo  18.  Hx sensorineural hearing loss with resultant dysarthria  19.  LUE numbness C5 distribution shoulder pain , ? Proximal weakness vs pain inhibition , only mild C4-5 stenosis on MRI  , MRI reviewed with Neuroradiology , no spinal cord signal abnormalities Neurosurg, Dr Dawley looked at MRI , C spine looks stable  Still has subjective numbess in left shoulder   20.  Insomnia- likely multifactorial, anxiety regarding medical condition, underlying anxiety d/o and on higher dose steroid taper - expect some improvement with steroid taper ,  - hydroxyzine  for anxiety  -did sleep last night 21.  Seizure like episode,  Prior hx but not well documented in medical record, recent Left pontine infarct (lower incidence of seizure for this anatomic location vs cortical) also with empty sella syndrome with diagnosis of addison's disease , already on moderate dose Keppra -   -CT of head  without changes  -EEG without sz activity   D/c LTM EEG per neurology  LOS: 9 days A FACE TO FACE EVALUATION WAS PERFORMED  Prentice FORBES Compton 05/10/2023, 8:08 AM    7

## 2023-05-10 NOTE — Group Note (Signed)
 Patient Details Name: Carlye Panameno MRN: [id number] DOB: [birth year] Today's Date: 05/10/2023  Time Calculation: OT Group Time Calculation OT Group Start Time: 1030 OT Group Stop Time: 1130 OT Group Time Calculation (min): 60 min      Group Description: Stress management: Pt participated in group session with a focus on stress mgmt, education provided on healthy coping strategies, and social interaction. Focus of session on providing coping strategies to manage new diagnosis to allow for improved mental health to increase overall quality of life . Discussed how to break down stressors into "daily hassles," "major life stressors" and "life circumstances" in an effort to allow pts to chunk their stressors into groups and determine where to best put their efforts/time when dealing with stress. Provided active listening, emotional support and therapeutic use of self. Offered education on factors that protect us  against stress such as "daily uplifts," "healthy coping strategies" and "protective factors." Encouraged all group members to make an effort to actively recall one event from their day that was a daily uplift in an effort to protect their mindset from stressors as well as sharing this information with their caregivers to facilitate improved caregiver communication and decrease overall burden of care.  Issued pt handouts on healthy coping strategies to implement into routine.   Individual level documentation: Patient participated with full collaboration during session.   Pain: No pain   Ronal Mallie Needy 05/10/2023, 12:29 PM

## 2023-05-10 NOTE — Group Note (Signed)
 Patient Details Name: Brittany Mullins MRN: [id number] DOB: 1980/07/03 Today's Date: 05/10/2023 Goals:  Pt will be be education on factors that contribute to stress & factors that protect against stress.  MET Pt will be provided with 4 relaxation training techniques for use.  MET Group Description: Stress management: Pt participated in group session with a focus on stress mgmt, education provided on healthy coping strategies, and social interaction. Focus of session on providing coping strategies to manage new diagnosis to allow for improved mental health to increase overall quality of life . Discussed how to break down stressors into "daily hassles," "major life stressors" and "life circumstances" in an effort to allow pts to chunk their stressors into groups and determine where to best put their efforts/time when dealing with stress. Provided active listening, emotional support and therapeutic use of self. Offered education on factors that protect us  against stress such as "daily uplifts," "healthy coping strategies" and "protective factors." Encouraged all group members to make an effort to actively recall one event from their day that was a daily uplift in an effort to protect their mindset from stressors as well as sharing this information with their caregivers to facilitate improved caregiver communication and decrease overall burden of care.  Issued pt handouts on healthy coping strategies to implement into routine.   Individual level documentation: Patient participated with full collaboration during session.   Pt easily engaged in group discussion and stated appreciation for this group at it's conclusion.  Pain:no c/o Pain Assessment Pain Scale: 0-10 Pain Score: 7  Faces Pain Scale: No hurt Pain Type: Acute pain Pain Location: Neck Pain Orientation: Right;Left Pain Radiating Towards: Shoulders Pain Descriptors / Indicators: Aching Pain Frequency: Constant Pain Onset:  On-going Patients Stated Pain Goal: 1 Pain Intervention(s): Medication (See eMAR)  Brittany Mullins 05/10/2023, 12:05 PM

## 2023-05-11 DIAGNOSIS — I639 Cerebral infarction, unspecified: Secondary | ICD-10-CM | POA: Diagnosis not present

## 2023-05-11 NOTE — Progress Notes (Signed)
 Occupational Therapy Session Note  Patient Details  Name: Brittany Mullins MRN: [id number] Date of Birth: 1980/09/16  Today's Date: 05/11/2023 OT Individual Time: 1000-1100 OT Individual Time Calculation (min): 60 min    Short Term Goals: Week 1:  OT Short Term Goal 1 (Week 1): pt will demonstrate improved use of L shoulder to don shirt with min A. OT Short Term Goal 1 - Progress (Week 1): Met OT Short Term Goal 2 (Week 1): Pt will be able to stand with min A to pull pants over hips. OT Short Term Goal 2 - Progress (Week 1): Met OT Short Term Goal 3 (Week 1): pt will be able to safely use RW to ambulate to toilet with mod A. OT Short Term Goal 3 - Progress (Week 1): Met Week 2:  OT Short Term Goal 1 (Week 2): STG= LTGs  Skilled Therapeutic Interventions/Progress Updates:    Pt received in room ready to shower this am. Pt used RW to ambulate in room and practiced picking items up from off the floor 4x with cue to use one hand on RW or wall or cabinet for extra support. Pt able to do this activity well with no LOB.  Ambulated to shower and then showered with mod I using seat.   [name] ,Brittany Mullins, arrived.  Pt continued drying off and dressing with no A needed and only set up.  Brittany Mullins practiced providing supervision with her ambulation in room and in hallway with RW.  Let him know to cue her to relax her shoulders if he notices she is tensing up too much. Pt taken to laundry room and practiced using reacher to retrieve clothing from dryer and a simulated tall washing machine with the reacher. Pt did not have any difficulty. Pt ambulated back to her room. Cleared [name] to assist walking her to the shower and getting her set up for a shower on Sunday.  Pt in room with all needs met resting in bed with bed alarm on.   Therapy Documentation Precautions:  Precautions Precautions: Fall Restrictions Weight Bearing Restrictions Per Provider Order: No  Vital Signs: Therapy Vitals Temp: 98.3  F (36.8 C) Pulse Rate: 64 Resp: 16 BP: 113/70 Patient Position (if appropriate): Lying Oxygen Therapy SpO2: 100 % O2 Device: Room Air Pain: Pain Assessment Pain Scale: 0-10 Pain Score: 2  Pain Type: Acute pain Pain Location: Shoulder Pain Orientation: Right;Left Pain Descriptors / Indicators: Aching Patients Stated Pain Goal: 2 Pain Intervention(s): Medication (See eMAR) ADL: ADL Eating: Independent Grooming: Modified independent Where Assessed-Grooming: Sitting at sink Upper Body Bathing: Modified independent Where Assessed-Upper Body Bathing: Shower Lower Body Bathing: Supervision/safety Where Assessed-Lower Body Bathing: Shower Upper Body Dressing: Modified independent (Device) Where Assessed-Upper Body Dressing: Wheelchair Lower Body Dressing: Supervision/safety Where Assessed-Lower Body Dressing: Wheelchair Toileting: Supervision/safety Where Assessed-Toileting: Teacher, Adult Education: Close supervision Statistician Method: Proofreader: Acupuncturist: Close supervision Film/video Editor Method: Designer, Industrial/product: Emergency planning/management officer, Grab bars     Therapy/Group: Individual Therapy  Brittany Mullins 05/11/2023, 7:56 AM

## 2023-05-11 NOTE — Progress Notes (Signed)
 PROGRESS NOTE   Subjective/Complaints:  Pt with mild pain but overall doing quite well. Had a very good night of sleep. Husband at bedside.   ROS: Patient denies fever, rash, sore throat, blurred vision, dizziness, nausea, vomiting, diarrhea, cough, shortness of breath or chest pain,   headache, or mood change.   Objective:   No results found.  Recent Labs    05/09/23 0548  WBC 8.2  HGB 13.9  HCT 40.8  PLT 330   Recent Labs    05/09/23 0548  NA 137  K 4.0  CL 99  CO2 27  GLUCOSE 71  BUN 18  CREATININE 0.77  CALCIUM  8.9    Intake/Output Summary (Last 24 hours) at 05/11/2023 0845 Last data filed at 05/11/2023 0801 Gross per 24 hour  Intake 720 ml  Output --  Net 720 ml        Physical Exam: Vital Signs Blood pressure 113/70, pulse 64, temperature 98.3 F (36.8 C), resp. rate 16, height 5' 2 (1.575 m), weight 70.3 kg, SpO2 100%.    Constitutional: No distress . Vital signs reviewed. HEENT: NCAT, EOMI, oral membranes moist Neck: supple Cardiovascular: RRR without murmur. No JVD    Respiratory/Chest: CTA Bilaterally without wheezes or rales. Normal effort    GI/Abdomen: BS +, non-tender, non-distended Ext: no clubbing, cyanosis, or edema Psych: pleasant and cooperative  Skin: No evidence of breakdown, no evidence of rash Neurologic: Cranial nerves II through XII intact, motor strength is 3- /5 in bilateral deltoid, bicep, tricep,RIght  grip, 5/5 Left grip, 5/5 left and 4/5 right hip flexor, knee extensors, ankle dorsiflexor and plantar flexor Some pain inhibition LUE  Sensory examreduced LT sensation RIght 4th and 5th digit, left shoulder  Cerebellar exam limited by strength on right and pain on left  Musculoskeletal: Full range of motion in all 4 extremities. No joint swelling. Myofascial pain of shoulder girdle   Assessment/Plan: 1. Functional deficits which require 3+ hours per day of  interdisciplinary therapy in a comprehensive inpatient rehab setting. Physiatrist is providing close team supervision and 24 hour management of active medical problems listed below. Physiatrist and rehab team continue to assess barriers to discharge/monitor patient progress toward functional and medical goals  Care Tool:  Bathing    Body parts bathed by patient: Abdomen, Chest, Front perineal area, Right upper leg, Left upper leg, Left lower leg, Face, Left arm, Right arm, Buttocks, Right lower leg   Body parts bathed by helper: Right lower leg, Buttocks, Right arm, Left arm     Bathing assist Assist Level: Supervision/Verbal cueing     Upper Body Dressing/Undressing Upper body dressing   What is the patient wearing?: Bra, Pull over shirt    Upper body assist Assist Level: Independent with assistive device    Lower Body Dressing/Undressing Lower body dressing      What is the patient wearing?: Underwear/pull up, Pants     Lower body assist Assist for lower body dressing: Supervision/Verbal cueing     Toileting Toileting    Toileting assist Assist for toileting: Supervision/Verbal cueing     Transfers Chair/bed transfer  Transfers assist     Chair/bed transfer assist level:  Contact Guard/Touching assist     Locomotion Ambulation   Ambulation assist      Assist level: Supervision/Verbal cueing Assistive device: Walker-rolling Max distance: 200   Walk 10 feet activity   Assist     Assist level: Supervision/Verbal cueing Assistive device: Walker-rolling   Walk 50 feet activity   Assist Walk 50 feet with 2 turns activity did not occur: Safety/medical concerns  Assist level: Supervision/Verbal cueing Assistive device: Walker-rolling    Walk 150 feet activity   Assist Walk 150 feet activity did not occur: Safety/medical concerns  Assist level: Supervision/Verbal cueing Assistive device: Walker-rolling    Walk 10 feet on uneven surface   activity   Assist Walk 10 feet on uneven surfaces activity did not occur: Safety/medical concerns   Assist level: Supervision/Verbal cueing Assistive device: Walker-rolling   Wheelchair     Assist Is the patient using a wheelchair?: Yes Type of Wheelchair: Manual    Wheelchair assist level: Supervision/Verbal cueing Max wheelchair distance: 100    Wheelchair 50 feet with 2 turns activity    Assist        Assist Level: Supervision/Verbal cueing   Wheelchair 150 feet activity     Assist      Assist Level: Maximal Assistance - Patient 25 - 49%   Blood pressure 113/70, pulse 64, temperature 98.3 F (36.8 C), resp. rate 16, height 5' 2 (1.575 m), weight 70.3 kg, SpO2 100%.  Medical Problem List and Plan: 1. Functional deficits secondary to Lft ventral pontine CVA, likely small vessel disease             -patient may shower             -ELOS/Goals: 2/14   Mod I to Sup PT/OT/SLP           -Continue CIR therapies including PT, OT, SLP  2.  Antithrombotics: -DVT/anticoagulation:  Pharmaceutical: Lovenox              -antiplatelet therapy: DAPT X 3 weeks followed by ASA alone.  3. Pain Management: On Methadone  (followed by [facility]).             --now on prn oxycodone  due to shoulder/arm pain reduced to 1 tab oxy IR 5, pt asking for 2 tabs, discussed concerns for sedation ( also hx OUD)             --gabapentin  TID for neuropathy 4. H/o bipolar d/o/Mood/Behavior/Sleep: LCSW to follow for evaluation and support.              -2/3 psych recommended abilify  7.5mg  q am and lamictal  25mg  bid   -abilify  begins today, lamictal  started yesterday  2/8 appears very calm and appropriate   -continue to provide egosupport             -antipsychotic agents: N/A--has not been on any psych meds for years.  5. Neuropsych/cognition: This patient is capable of making decisions on her own behalf. 6. Skin/Wound Care: Routine pressure relief measures. 7.  Fluids/Electrolytes/Nutrition: Monitor I/O.    2/3 potassium 4.0, continue daily supplement     -bun a little better. Continue to encourage fluids 8. HTN: Monitor BP TID--continue Cozaar  and Norvasc .    Bp's currently tolerable 9. 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 Addison's disease: Now on Cortef  10 mg am/5 mg pm 11. Chronic pancreatitis: Has chronic issues with nausea --continue phenergan  --continue PPI Vomiting yesterday abd exam normal  BM  this am  Possibility of new meds causing nausea  13. Constipation: Reports rectal bleeding-->colace added to senna due to hx of hems.   14. Left arm shoulder/elbow pain             -Xray elbow/shoulder 1/28 negative Suspect myofascial pain +/- frozen shoulder - improved shoulder ROM, still with upper trap trigger point -   2/3 pt received trigger point injection yesterday. ?improvement--was focused on other issues today Has pec and trap soreness with increased walker use , MSK chest pain trat symptomatically  15. Marijuana use             -Advise cessation, associated with increased stroke risk 26-44% depending on study   -2/3 uds + yesterday for thc, benzos. Urine drug profile sent to [facility], pending  -f/u UDS c/w methadone  use, THC 107 16. Hx of pituitary tumor             -f/u Neurosurgery 17. Low protein S             -f/u Hematology outpatient, will need retesting in ~16mo  18.  Hx sensorineural hearing loss with resultant dysarthria  19.  LUE numbness C5 distribution shoulder pain , ? Proximal weakness vs pain inhibition , only mild C4-5 stenosis on MRI  , MRI reviewed with Neuroradiology , no spinal cord signal abnormalities Neurosurg, Dr Dawley looked at MRI , C spine looks stable  Still has subjective numbess in left shoulder   20.  Insomnia- likely multifactorial, anxiety regarding medical condition, underlying anxiety d/o and on higher dose steroid taper - expect some improvement with  steroid taper ,  - hydroxyzine  for anxiety  -sleeping much better 21.  Seizure like episode,  Prior hx but not well documented in medical record, recent Left pontine infarct (lower incidence of seizure for this anatomic location vs cortical) also with empty sella syndrome with diagnosis of addison's disease , already on moderate dose Keppra -   -CT of head without changes  -EEG without sz activity   D/c'ed LTM EEG per neurology  LOS: 10 days A FACE TO FACE EVALUATION WAS PERFORMED  Brittany Mullins 05/11/2023, 8:45 AM    7

## 2023-05-11 NOTE — Progress Notes (Signed)
 Physical Therapy Session Note  Patient Details  Name: Brittany Mullins MRN: [id number] Date of Birth: [birth year]  Today's Date: 05/11/2023 PT Individual Time: 0805-0907 PT Individual Time Calculation (min): 62 min   Short Term Goals: Week 1:  PT Short Term Goal 1 (Week 1): Pt wil perform bed mobilty from flat surface with min assist to the L to simulate home environment PT Short Term Goal 1 - Progress (Week 1): Met PT Short Term Goal 2 (Week 1): Pt will perform transfers with min assist PT Short Term Goal 2 - Progress (Week 1): Met PT Short Term Goal 3 (Week 1): Pt will perform 4 steps with min assist PT Short Term Goal 3 - Progress (Week 1): Met PT Short Term Goal 4 (Week 1): Pt will be able to gait x 50' with min assist PT Short Term Goal 4 - Progress (Week 1): Met  Skilled Therapeutic Interventions/Progress Updates: Patient supine in bed on entrance to room. Patient alert and agreeable to PT session.   Patient with no complaints of pain. Pt reported onset of another PNES event that was able to be combated with sensory grounding technique that therapy has been working on with pt. Pt transported outside to main entrance to see dog in order to boost pt morale as pt has not seen her dog since admission. Pt thankful.  Therapeutic Activity: Bed Mobility: Pt performed supine<sit on EOB with supervision and use of bed features. Pt sitting EOB and donned personal shoes with supervision. Pt cued to posteriorly scoot back in bed to avoid being too close to EOB. Transfers: Pt performed sit<>stand transfers throughout session with supervision and with RW. Provided VC for safe proximity of RW and hand placement.  TUG (avg = 25.51s) in RW with supervision from [facility]. Pt has improved cadence, step width and length.  - 27.78s  - 27.86s  - 20.88s  Gait Training:  Pt ambulated 400'+ from N tower to main gym with supervision and in RW. Pt continues to demonstrate increased heel-strike  bilaterally. B knee/hip flexion still present to some degree due to tight gastrocnemius. Pt required VC to increase stepping width, decrease WB on B UE (R>L) and to depress B shoulders. Pt with decreased cadence throughout and did not require a seated rest break due to fatigue. Pt has been increasing ambulatory endurance since evaluation, and continues to improve gait sequence with VC.   Therapeutic Exercise: Pt performed the following exercises with therapist providing the described cuing and facilitation for improvement. - Standing gastrocnemius stretch with red wedge and L HHA (B LE). Pt cued to extend to stretch, not to pain, in B LE's. Pt performed x 3 with 10s holds. R Knee in greater flexion due to increased tightness in gastrocnemius vs L knee  Patient sitting in WC at end of session with brakes locked, belt alarm set, and all needs within reach.      Therapy Documentation Precautions:  Precautions Precautions: Fall Restrictions Weight Bearing Restrictions Per Provider Order: No  Therapy/Group: Individual Therapy  Lamarco Gudiel PTA 05/11/2023, 12:37 PM

## 2023-05-12 DIAGNOSIS — I639 Cerebral infarction, unspecified: Secondary | ICD-10-CM | POA: Diagnosis not present

## 2023-05-12 MED ORDER — CYCLOBENZAPRINE HCL 5 MG PO TABS
5.0000 mg | ORAL_TABLET | Freq: Once | ORAL | Status: DC
  Filled 2023-05-12 (×2): qty 1

## 2023-05-12 NOTE — Progress Notes (Signed)
 PROGRESS NOTE   Subjective/Complaints:  Had another good night. Up in the bathroom when I came in. No new complaints. Still with some pain but tolerable.   ROS: Patient denies fever, rash, sore throat, blurred vision, dizziness, nausea, vomiting, diarrhea, cough, shortness of breath or chest pain,   or mood change.     Objective:   No results found.  No results for input(s): WBC, HGB, HCT, PLT in the last 72 hours.  No results for input(s): NA, K, CL, CO2, GLUCOSE, BUN, CREATININE, CALCIUM  in the last 72 hours.   Intake/Output Summary (Last 24 hours) at 05/12/2023 0837 Last data filed at 05/12/2023 0747 Gross per 24 hour  Intake 480 ml  Output --  Net 480 ml        Physical Exam: Vital Signs Blood pressure 111/68, pulse 71, temperature 97.8 F (36.6 C), temperature source Oral, resp. rate 18, height 5' 2 (1.575 m), weight 70.3 kg, SpO2 100%.    Constitutional: No distress . Vital signs reviewed. HEENT: NCAT, EOMI, oral membranes moist Neck: supple Cardiovascular: RRR without murmur. No JVD    Respiratory/Chest: CTA Bilaterally without wheezes or rales. Normal effort    GI/Abdomen: BS +, non-tender, non-distended Ext: no clubbing, cyanosis, or edema Psych: pleasant and cooperative, in good spirits  Skin: No evidence of breakdown, no evidence of rash Neurologic: Cranial nerves II through XII intact, motor strength is 3- /5 in bilateral deltoid, bicep, tricep,RIght  grip, 5/5 Left grip, 5/5 left and 4/5 right hip flexor, knee extensors, ankle dorsiflexor and plantar flexor--stable exam 2/9 Some pain inhibition LUE  Sensory examreduced LT sensation RIght 4th and 5th digit, left shoulder  Cerebellar exam limited by strength on right and pain on left  Musculoskeletal: Full range of motion in all 4 extremities. No joint swelling. Myofascial pain of shoulder girdle   Assessment/Plan: 1.  Functional deficits which require 3+ hours per day of interdisciplinary therapy in a comprehensive inpatient rehab setting. Physiatrist is providing close team supervision and 24 hour management of active medical problems listed below. Physiatrist and rehab team continue to assess barriers to discharge/monitor patient progress toward functional and medical goals  Care Tool:  Bathing    Body parts bathed by patient: Abdomen, Chest, Front perineal area, Right upper leg, Left upper leg, Left lower leg, Face, Left arm, Right arm, Buttocks, Right lower leg   Body parts bathed by helper: Right lower leg, Buttocks, Right arm, Left arm     Bathing assist Assist Level: Supervision/Verbal cueing     Upper Body Dressing/Undressing Upper body dressing   What is the patient wearing?: Bra, Pull over shirt    Upper body assist Assist Level: Independent with assistive device    Lower Body Dressing/Undressing Lower body dressing      What is the patient wearing?: Underwear/pull up, Pants     Lower body assist Assist for lower body dressing: Supervision/Verbal cueing     Toileting Toileting    Toileting assist Assist for toileting: Supervision/Verbal cueing     Transfers Chair/bed transfer  Transfers assist     Chair/bed transfer assist level: Contact Guard/Touching assist     Locomotion Ambulation  Ambulation assist      Assist level: Supervision/Verbal cueing Assistive device: Walker-rolling Max distance: 200   Walk 10 feet activity   Assist     Assist level: Supervision/Verbal cueing Assistive device: Walker-rolling   Walk 50 feet activity   Assist Walk 50 feet with 2 turns activity did not occur: Safety/medical concerns  Assist level: Supervision/Verbal cueing Assistive device: Walker-rolling    Walk 150 feet activity   Assist Walk 150 feet activity did not occur: Safety/medical concerns  Assist level: Supervision/Verbal cueing Assistive device:  Walker-rolling    Walk 10 feet on uneven surface  activity   Assist Walk 10 feet on uneven surfaces activity did not occur: Safety/medical concerns   Assist level: Supervision/Verbal cueing Assistive device: Walker-rolling   Wheelchair     Assist Is the patient using a wheelchair?: Yes Type of Wheelchair: Manual    Wheelchair assist level: Supervision/Verbal cueing Max wheelchair distance: 100    Wheelchair 50 feet with 2 turns activity    Assist        Assist Level: Supervision/Verbal cueing   Wheelchair 150 feet activity     Assist      Assist Level: Maximal Assistance - Patient 25 - 49%   Blood pressure 111/68, pulse 71, temperature 97.8 F (36.6 C), temperature source Oral, resp. rate 18, height 5' 2 (1.575 m), weight 70.3 kg, SpO2 100%.  Medical Problem List and Plan: 1. Functional deficits secondary to Lft ventral pontine CVA, likely small vessel disease             -patient may shower             -ELOS/Goals: 2/14   Mod I to Sup PT/OT/SLP         -Continue CIR therapies including PT, OT, and SLP  2.  Antithrombotics: -DVT/anticoagulation:  Pharmaceutical: Lovenox              -antiplatelet therapy: DAPT X 3 weeks followed by ASA alone.  3. Pain Management: On Methadone  (followed by [facility]).             --now on prn oxycodone  due to shoulder/arm pain reduced to 1 tab oxy IR 5, pt asking for 2 tabs, discussed concerns for sedation ( also hx OUD)             --gabapentin  TID for neuropathy 4. H/o bipolar d/o/Mood/Behavior/Sleep: LCSW to follow for evaluation and support.              -2/3 psych recommended abilify  7.5mg  q am and lamictal  25mg  bid   -abilify  begins today, lamictal  started yesterday  2/8-9 has had a great weekend--continue to provide ego support   -same meds             -antipsychotic agents: N/A--has not been on any psych meds for years.  5. Neuropsych/cognition: This patient is capable of making decisions on her own  behalf. 6. Skin/Wound Care: Routine pressure relief measures. 7. Fluids/Electrolytes/Nutrition: Monitor I/O.    2/3 potassium 4.0, continue daily supplement     -bun a little better. Continue to encourage fluids 8. HTN: Monitor BP TID--continue Cozaar  and Norvasc .    Bp's currently tolerable 9. 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 Addison's disease: Now on Cortef  10 mg am/5 mg pm 11. Chronic pancreatitis: Has chronic issues with nausea --continue phenergan  --continue PPI Vomiting yesterday abd exam normal  BM this am  Possibility of new meds  causing nausea  13. Constipation: Reports rectal bleeding-->colace added to senna due to hx of hems.   14. Left arm shoulder/elbow pain             -Xray elbow/shoulder 1/28 negative Suspect myofascial pain +/- frozen shoulder - improved shoulder ROM, still with upper trap trigger point -   2/3 pt received trigger point injection yesterday. ?improvement--was focused on other issues today Has pec and trap soreness with increased walker use , MSK chest pain trat symptomatically  15. Marijuana use             -Advise cessation, associated with increased stroke risk 26-44% depending on study   -2/3 uds + yesterday for thc, benzos. Urine drug profile sent to [facility], pending  -f/u UDS c/w methadone  use, THC 107 16. Hx of pituitary tumor             -f/u Neurosurgery 17. Low protein S             -f/u Hematology outpatient, will need retesting in ~86mo  18.  Hx sensorineural hearing loss with resultant dysarthria  19.  LUE numbness C5 distribution shoulder pain , ? Proximal weakness vs pain inhibition , only mild C4-5 stenosis on MRI  , MRI reviewed with Neuroradiology , no spinal cord signal abnormalities Neurosurg, Dr Dawley looked at MRI , C spine looks stable  Still has subjective numbess in left shoulder   20.  Insomnia- likely multifactorial, anxiety regarding medical condition, underlying anxiety d/o  and on higher dose steroid taper - expect some improvement with steroid taper ,  - hydroxyzine  for anxiety  -sleeping consistently now 21.  Seizure like episode,  Prior hx but not well documented in medical record, recent Left pontine infarct (lower incidence of seizure for this anatomic location vs cortical) also with empty sella syndrome with diagnosis of addison's disease , already on moderate dose Keppra -   -CT of head without changes  -EEG without sz activity   D/c'ed LTM EEG per neurology  LOS: 11 days A FACE TO FACE EVALUATION WAS PERFORMED  Brittany Mullins 05/12/2023, 8:37 AM    7

## 2023-05-12 NOTE — Plan of Care (Signed)
   Problem: Consults Goal: RH STROKE PATIENT EDUCATION Description: See Patient Education module for education specifics  Outcome: Progressing   Problem: RH BOWEL ELIMINATION Goal: RH STG MANAGE BOWEL WITH ASSISTANCE Description: STG Manage Bowel with mod I Assistance. Outcome: Progressing Goal: RH STG MANAGE BOWEL W/MEDICATION W/ASSISTANCE Description: STG Manage Bowel with Medication with mod I Assistance. Outcome: Progressing   Problem: RH SAFETY Goal: RH STG ADHERE TO SAFETY PRECAUTIONS W/ASSISTANCE/DEVICE Description: STG Adhere to Safety Precautions With cues Assistance/Device. Outcome: Progressing   Problem: RH PAIN MANAGEMENT Goal: RH STG PAIN MANAGED AT OR BELOW PT'S PAIN GOAL Description: < 4 with prns Outcome: Progressing   Problem: RH KNOWLEDGE DEFICIT Goal: RH STG INCREASE KNOWLEDGE OF HYPERTENSION Description: Patient and S.O. will be able to manage HTN using educational resources for medications and dietary modifications independently Outcome: Progressing Goal: RH STG INCREASE KNOWLEGDE OF HYPERLIPIDEMIA Description: Patient and S.O. will be able to manage HLD using educational resources for medications and dietary modifications independently Outcome: Progressing Goal: RH STG INCREASE KNOWLEDGE OF STROKE PROPHYLAXIS Description: Patient and S.O. will be able to manage secondary risks using educational resources for medications and dietary modifications independently Outcome: Progressing

## 2023-05-12 NOTE — Plan of Care (Signed)
  Problem: RH Wheelchair Mobility Goal: LTG Patient will propel w/c in controlled environment (PT) Description: LTG: Patient will propel wheelchair in controlled environment, # of feet with assist (PT) Outcome: Not Applicable Flowsheets (Taken 05/12/2023 2052) LTG: Pt will propel w/c in controlled environ  assist needed:: (d/c due to progress) --   Problem: RH Balance Goal: LTG Patient will maintain dynamic sitting balance (PT) Description: LTG:  Patient will maintain dynamic sitting balance with assistance during mobility activities (PT) Flowsheets (Taken 05/12/2023 2052) LTG: Pt will maintain dynamic sitting balance during mobility activities with:: (upgraded due to progress) Independent Goal: LTG Patient will maintain dynamic standing balance (PT) Description: LTG:  Patient will maintain dynamic standing balance with assistance during mobility activities (PT) Flowsheets (Taken 05/12/2023 2052) LTG: Pt will maintain dynamic standing balance during mobility activities with:: (upgraded due to progress) Independent with assistive device    Problem: RH Bed Mobility Goal: LTG Patient will perform bed mobility with assist (PT) Description: LTG: Patient will perform bed mobility with assistance, with/without cues (PT). Flowsheets (Taken 05/12/2023 2052) LTG: Pt will perform bed mobility with assistance level of: (upgraded due to progress) Independent   Problem: RH Bed to Chair Transfers Goal: LTG Patient will perform bed/chair transfers w/assist (PT) Description: LTG: Patient will perform bed to chair transfers with assistance (PT). Flowsheets (Taken 05/12/2023 2052) LTG: Pt will perform Bed to Chair Transfers with assistance level: (upgraded due to progress) Independent with assistive device    Problem: RH Ambulation Goal: LTG Patient will ambulate in controlled environment (PT) Description: LTG: Patient will ambulate in a controlled environment, # of feet with assistance (PT). Flowsheets (Taken  05/12/2023 2052) LTG: Pt will ambulate in controlled environ  assist needed:: Supervision/Verbal cueing LTG: Ambulation distance in controlled environment: (upgraded due to progress) -- Goal: LTG Patient will ambulate in home environment (PT) Description: LTG: Patient will ambulate in home environment, # of feet with assistance (PT). Flowsheets (Taken 05/12/2023 2052) LTG: Pt will ambulate in home environ  assist needed:: (upgraded due to progress) Supervision/Verbal cueing   Problem: RH Stairs Goal: LTG Patient will ambulate up and down stairs w/assist (PT) Description: LTG: Patient will ambulate up and down # of stairs with assistance (PT) Flowsheets (Taken 05/12/2023 2052) LTG: Pt will ambulate up/down stairs assist needed:: (upgraded due to progress) Supervision/Verbal cueing

## 2023-05-13 DIAGNOSIS — I639 Cerebral infarction, unspecified: Secondary | ICD-10-CM | POA: Diagnosis not present

## 2023-05-13 DIAGNOSIS — M25512 Pain in left shoulder: Secondary | ICD-10-CM | POA: Diagnosis not present

## 2023-05-13 DIAGNOSIS — G5621 Lesion of ulnar nerve, right upper limb: Secondary | ICD-10-CM | POA: Diagnosis not present

## 2023-05-13 DIAGNOSIS — F1129 Opioid dependence with unspecified opioid-induced disorder: Secondary | ICD-10-CM | POA: Diagnosis not present

## 2023-05-13 LAB — CBC
HCT: 37.4 % (ref 36.0–46.0)
Hemoglobin: 12.4 g/dL (ref 12.0–15.0)
MCH: 30.8 pg (ref 26.0–34.0)
MCHC: 33.2 g/dL (ref 30.0–36.0)
MCV: 93 fL (ref 80.0–100.0)
Platelets: 263 K/uL (ref 150–400)
RBC: 4.02 MIL/uL (ref 3.87–5.11)
RDW: 12.3 % (ref 11.5–15.5)
WBC: 5.5 K/uL (ref 4.0–10.5)
nRBC: 0 % (ref 0.0–0.2)

## 2023-05-13 LAB — BASIC METABOLIC PANEL WITH GFR
Anion gap: 15 (ref 5–15)
BUN: 20 mg/dL (ref 6–20)
CO2: 26 mmol/L (ref 22–32)
Calcium: 9.4 mg/dL (ref 8.9–10.3)
Chloride: 99 mmol/L (ref 98–111)
Creatinine, Ser: 0.61 mg/dL (ref 0.44–1.00)
GFR, Estimated: >60 mL/min
Glucose, Bld: 81 mg/dL (ref 70–99)
Potassium: 4 mmol/L (ref 3.5–5.1)
Sodium: 140 mmol/L (ref 135–145)

## 2023-05-13 MED ORDER — GABAPENTIN 100 MG PO CAPS
200.0000 mg | ORAL_CAPSULE | Freq: Two times a day (BID) | ORAL | Status: DC
  Administered 2023-05-13 – 2023-05-14 (×2): 200 mg via ORAL
  Filled 2023-05-13 (×2): qty 2

## 2023-05-13 MED ORDER — GABAPENTIN 300 MG PO CAPS
300.0000 mg | ORAL_CAPSULE | Freq: Every day | ORAL | Status: DC
  Administered 2023-05-13: 300 mg via ORAL
  Filled 2023-05-13: qty 1

## 2023-05-13 MED ORDER — GABAPENTIN 100 MG PO CAPS: 200.0000 mg | ORAL_CAPSULE | Freq: Two times a day (BID) | ORAL | Status: DC

## 2023-05-13 NOTE — Progress Notes (Signed)
 Occupational Therapy Note  Patient Details  Name: Brittany Mullins MRN: [id number] Date of Birth: [birth year]  Today's Date: 05/13/2023 OT Missed Time: 60 Minutes Missed Time Reason: Pain;Patient fatigue  Pt greeted supine in bed with the lights off with pt reporting pain and fatigue. Pain meds had already been provided. Pt declining session at this time. Returned 30 mins later for second attempt with pt continuing to decline session. Did retrieve rollator and left in room to be trialed tomorrow during therapies.    Mollie Anger [facility] 05/13/2023, 1:42 PM

## 2023-05-13 NOTE — Plan of Care (Signed)
   Problem: Consults Goal: RH STROKE PATIENT EDUCATION Description: See Patient Education module for education specifics  Outcome: Progressing   Problem: RH BOWEL ELIMINATION Goal: RH STG MANAGE BOWEL WITH ASSISTANCE Description: STG Manage Bowel with mod I Assistance. Outcome: Progressing Goal: RH STG MANAGE BOWEL W/MEDICATION W/ASSISTANCE Description: STG Manage Bowel with Medication with mod I Assistance. Outcome: Progressing   Problem: RH SAFETY Goal: RH STG ADHERE TO SAFETY PRECAUTIONS W/ASSISTANCE/DEVICE Description: STG Adhere to Safety Precautions With cues Assistance/Device. Outcome: Progressing   Problem: RH PAIN MANAGEMENT Goal: RH STG PAIN MANAGED AT OR BELOW PT'S PAIN GOAL Description: < 4 with prns Outcome: Progressing   Problem: RH KNOWLEDGE DEFICIT Goal: RH STG INCREASE KNOWLEDGE OF HYPERTENSION Description: Patient and S.O. will be able to manage HTN using educational resources for medications and dietary modifications independently Outcome: Progressing Goal: RH STG INCREASE KNOWLEGDE OF HYPERLIPIDEMIA Description: Patient and S.O. will be able to manage HLD using educational resources for medications and dietary modifications independently Outcome: Progressing Goal: RH STG INCREASE KNOWLEDGE OF STROKE PROPHYLAXIS Description: Patient and S.O. will be able to manage secondary risks using educational resources for medications and dietary modifications independently Outcome: Progressing

## 2023-05-13 NOTE — Progress Notes (Signed)
 Occupational Therapy Session Note  Patient Details  Name: Brittany Mullins MRN: [id number] Date of Birth: [birth year]  Today's Date: 05/13/2023 OT Individual Time: 2725-3664 OT Individual Time Calculation (min): 45 min    Short Term Goals: Week 1:  OT Short Term Goal 1 (Week 1): pt will demonstrate improved use of L shoulder to don shirt with min A. OT Short Term Goal 1 - Progress (Week 1): Met OT Short Term Goal 2 (Week 1): Pt will be able to stand with min A to pull pants over hips. OT Short Term Goal 2 - Progress (Week 1): Met OT Short Term Goal 3 (Week 1): pt will be able to safely use RW to ambulate to toilet with mod A. OT Short Term Goal 3 - Progress (Week 1): Met Week 2:  OT Short Term Goal 1 (Week 2): STG= LTGs  Skilled Therapeutic Interventions/Progress Updates:    Pt received in w/c ready for therapy.  Pt eager to work on ambulation and endurance.  No rollaters available for pt use at this time for pt to trial. Had pt continue to use RW. Pt able to ambulate from her room to day room back to her wing with no rest breaks (well over 1000 ft) and only with cues to relax her shoulders and keep her breathing easy.  Pt c/o tightness in R inner thigh.  Had her sit in long sitting on mat and she worked on various stretches to target hamstring, adductors, calves.  In supine, knee to chest and then leg extension toward ceiling using gait belt as a stretching strap. Pt also worked on opening leg to the side and then slightly across her body for IT band.  Sitting back on mat with hip flexion with knee out and leaning forward to target piriformis. Pt stated she felt this stretch was helpful and comfortable.  Pt then continued ambulation.  Sat on couch in ADL apt which is a low couch. No difficulty with sit to stands.  Pt ambulated back to room and opted to sit in a recliner. Belt alarm on and all needs  met.   Therapy Documentation Precautions:  Precautions Precautions:  Fall Restrictions Weight Bearing Restrictions Per Provider Order: No General: General OT Amount of Missed Time: 15 Minutes Pain: Pain Assessment Pain Scale: 0-10 Pain Score: 4  - right leg, worked on stretching to help with discomfort ADL: ADL Eating: Independent Grooming: Modified independent Where Assessed-Grooming: Sitting at sink Upper Body Bathing: Modified independent Where Assessed-Upper Body Bathing: Shower Lower Body Bathing: Setup Where Assessed-Lower Body Bathing: Shower Upper Body Dressing: Modified independent (Device) Where Assessed-Upper Body Dressing: Wheelchair Lower Body Dressing: Setup Where Assessed-Lower Body Dressing: Wheelchair Toileting: Supervision/safety Where Assessed-Toileting: Teacher, adult education: Close supervision Statistician Method: Proofreader: Acupuncturist: Close supervision Film/video editor Method: Designer, industrial/product: Emergency planning/management officer, Grab bars   Therapy/Group: Individual Therapy  Alecia Doi 05/13/2023, 1:05 PM

## 2023-05-13 NOTE — Progress Notes (Signed)
 Occupational Therapy Session Note  Patient Details  Name: Brittany Mullins MRN: [id number] Date of Birth: 1980-04-07  Today's Date: 05/13/2023 OT Individual Time: [id number] OT Individual Time Calculation (min): 31 min  14 mins missed; delay in care   Short Term Goals: Week 2:  OT Short Term Goal 1 (Week 2): STG= LTGs  Skilled Therapeutic Interventions/Progress Updates:   Pt greeted seated in w/c with NT present assisting pt out of the bathroom, pt agreeable to OT intervention.      Transfers/bed mobility/functional mobility: pt completed stand pivot transfer from w/c<>TTB with use of grab bars and distant supervision.   ADLs:  Grooming: pt completed seated grooming at sink MODI UB dressing:pt donned OH shirt and bra MODI LB dressing: pt donned pants and underwear MODI Footwear: pt donned socks via figure 4 MODI.   Bathing: pt bathing seated/standing on TTB with supervision  Transfers: stand pivots throughout session MODI  Ended session with pt seated in w/c with all needs within reach.            Therapy Documentation Precautions:  Precautions Precautions: Fall Restrictions Weight Bearing Restrictions Per Provider Order: No  Pain: No pain reported during session    Therapy/Group: Individual Therapy  Willadean Hark 05/13/2023, 12:13 PM

## 2023-05-13 NOTE — Progress Notes (Signed)
 Speech Language Pathology Daily Session Note  Patient Details  Name: Brittany Mullins MRN: [id number] Date of Birth: 1980-10-16  Today's Date: 05/13/2023 SLP Individual Time: 4098-1191 SLP Individual Time Calculation (min): 40 min  Short Term Goals: Week 2: SLP Short Term Goal 1 (Week 2): STG = LTG due to ELOS  Skilled Therapeutic Interventions: SLP conducted skilled therapy session targeting cognitive retraining goals. Patient and SLP discussed all previous strategies for memory learned in past therapy sessions as well as communication strategies with patient recalling communication strategies with 100% accuracy with supervision and memory strategies with min assist. Patient remarks that her lingering cognitive deficits include brain fog, memory deficits, and attention deficits. Patient completed mildly complex sustained attention task with supervision-min assist overall with 100% accuracy. Patient was left in lowered bed with call bell in reach and bed alarm set. SLP will continue to target goals per plan of care.       Pain Pain Assessment Pain Scale: 0-10 Pain Score: 6   Therapy/Group: Individual Therapy  [name], M.A., CCC-SLP  [name] 05/13/2023, 12:09 PM

## 2023-05-13 NOTE — Progress Notes (Signed)
 Patient ID: Brittany Mullins, female   DOB: [birth year], 43 y.o.   MRN: [id number]  SW ordered TTB and shower chair with back with [employer] via parachute.  Norval Been, MSW, LCSW Office: [phone] Cell: [phone] Fax: [phone]

## 2023-05-13 NOTE — Progress Notes (Addendum)
 PROGRESS NOTE   Subjective/Complaints:  RLE burning pain at noc , discussed post CVA neurogenic pain  Pt is happy about approval for 1st floor apt  ROS: Patient denies fever, rash, sore throat, blurred vision, dizziness, nausea, vomiting, diarrhea, cough, shortness of breath or chest pain,   or mood change.     Objective:   No results found.  Recent Labs    05/13/23 0532  WBC 5.5  HGB 12.4  HCT 37.4  PLT 263    Recent Labs    05/13/23 0532  NA 140  K 4.0  CL 99  CO2 26  GLUCOSE 81  BUN 20  CREATININE 0.61  CALCIUM  9.4     Intake/Output Summary (Last 24 hours) at 05/13/2023 0853 Last data filed at 05/12/2023 1844 Gross per 24 hour  Intake 474 ml  Output --  Net 474 ml        Physical Exam: Vital Signs Blood pressure 108/68, pulse 78, temperature 97.8 F (36.6 C), temperature source Oral, resp. rate 18, height 5\' 2"  (1.575 m), weight 70.3 kg, SpO2 100%.   General: No acute distress Mood and affect are appropriate Heart: Regular rate and rhythm no rubs murmurs or extra sounds Lungs: Clear to auscultation, breathing unlabored, no rales or wheezes Abdomen: Positive bowel sounds, soft nontender to palpation, nondistended Extremities: No clubbing, cyanosis, or edema  Skin: No evidence of breakdown, no evidence of rash Neurologic: Cranial nerves II through XII intact, motor strength is 3- /5 in bilateral deltoid, bicep, tricep,RIght  grip, 5/5 Left grip, 5/5 left and 4/5 right hip flexor, knee extensors, ankle dorsiflexor and plantar flexor--stable exam 2/9 Some pain inhibition LUE  Sensory examreduced LT sensation RIght 4th and 5th digit, left shoulder  Cerebellar exam limited by strength on right and pain on left  Musculoskeletal: Full range of motion in all 4 extremities. No joint swelling. Myofascial pain of shoulder girdle   Assessment/Plan: 1. Functional deficits which require 3+ hours per day  of interdisciplinary therapy in a comprehensive inpatient rehab setting. Physiatrist is providing close team supervision and 24 hour management of active medical problems listed below. Physiatrist and rehab team continue to assess barriers to discharge/monitor patient progress toward functional and medical goals  Care Tool:  Bathing    Body parts bathed by patient: Abdomen, Chest, Front perineal area, Right upper leg, Left upper leg, Left lower leg, Face, Left arm, Right arm, Buttocks, Right lower leg   Body parts bathed by helper: Right lower leg, Buttocks, Right arm, Left arm     Bathing assist Assist Level: Supervision/Verbal cueing     Upper Body Dressing/Undressing Upper body dressing   What is the patient wearing?: Bra, Pull over shirt    Upper body assist Assist Level: Independent with assistive device    Lower Body Dressing/Undressing Lower body dressing      What is the patient wearing?: Underwear/pull up, Pants     Lower body assist Assist for lower body dressing: Supervision/Verbal cueing     Toileting Toileting    Toileting assist Assist for toileting: Supervision/Verbal cueing     Transfers Chair/bed transfer  Transfers assist     Chair/bed transfer  assist level: Contact Guard/Touching assist     Locomotion Ambulation   Ambulation assist      Assist level: Supervision/Verbal cueing Assistive device: Walker-rolling Max distance: 200   Walk 10 feet activity   Assist     Assist level: Supervision/Verbal cueing Assistive device: Walker-rolling   Walk 50 feet activity   Assist Walk 50 feet with 2 turns activity did not occur: Safety/medical concerns  Assist level: Supervision/Verbal cueing Assistive device: Walker-rolling    Walk 150 feet activity   Assist Walk 150 feet activity did not occur: Safety/medical concerns  Assist level: Supervision/Verbal cueing Assistive device: Walker-rolling    Walk 10 feet on uneven surface   activity   Assist Walk 10 feet on uneven surfaces activity did not occur: Safety/medical concerns   Assist level: Supervision/Verbal cueing Assistive device: Walker-rolling   Wheelchair     Assist Is the patient using a wheelchair?: Yes Type of Wheelchair: Manual    Wheelchair assist level: Supervision/Verbal cueing Max wheelchair distance: 100    Wheelchair 50 feet with 2 turns activity    Assist        Assist Level: Supervision/Verbal cueing   Wheelchair 150 feet activity     Assist      Assist Level: Maximal Assistance - Patient 25 - 49%   Blood pressure 108/68, pulse 78, temperature 97.8 F (36.6 C), temperature source Oral, resp. rate 18, height 5\' 2"  (1.575 m), weight 70.3 kg, SpO2 100%.  Medical Problem List and Plan: 1. Functional deficits secondary to Lft ventral pontine CVA, likely small vessel disease             -patient may shower             -ELOS/Goals: 2/14   Mod I to Sup PT/OT/SLP         -Continue CIR therapies including PT, OT, and SLP  2.  Antithrombotics: -DVT/anticoagulation:  Pharmaceutical: Lovenox              -antiplatelet therapy: DAPT X 3 weeks followed by ASA alone.  3. Pain Management: On Methadone  (followed by [facility]).             --now on prn oxycodone  due to shoulder/arm pain reduced to 1 tab oxy IR 5, pt asking for 2 tabs, discussed concerns for sedation ( also hx OUD)             --gabapentin  TID for neuropathy, increase qh dose  4. H/o bipolar d/o/Mood/Behavior/Sleep: LCSW to follow for evaluation and support.              -2/3 psych recommended abilify  7.5mg  q am and lamictal  25mg  bid   -abilify  begins today, lamictal  started yesterday  2/8-9 has had a great weekend--continue to provide ego support   -same meds             -antipsychotic agents: N/A--has not been on any psych meds for years.  5. Neuropsych/cognition: This patient is capable of making decisions on her own behalf. 6. Skin/Wound Care: Routine  pressure relief measures. 7. Fluids/Electrolytes/Nutrition: Monitor I/O.    2/3 potassium 4.0, continue daily supplement      Latest Ref Rng & Units 05/13/2023    5:32 AM 05/09/2023    5:48 AM 05/06/2023    5:18 AM  BMP  Glucose 70 - 99 mg/dL 81  71  74   BUN 6 - 20 mg/dL 20  18  22    Creatinine 0.44 - 1.00 mg/dL 1.61  0.96  0.77   Sodium 135 - 145 mmol/L 140  137  139   Potassium 3.5 - 5.1 mmol/L 4.0  4.0  4.0   Chloride 98 - 111 mmol/L 99  99  105   CO2 22 - 32 mmol/L 26  27  27    Calcium  8.9 - 10.3 mg/dL 9.4  8.9  8.8     8. HTN: Monitor BP TID--continue Cozaar  and Norvasc .    Bp's currently tolerable 9. 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 Addison's disease: Now on Cortef  10 mg am/5 mg pm 11. Chronic pancreatitis: Has chronic issues with nausea --continue phenergan  --continue PPI Vomiting yesterday abd exam normal  BM this am  Possibility of new meds causing nausea  13. Constipation: Reports rectal bleeding-->colace added to senna due to hx of hems.   14. Left arm shoulder/elbow pain             -Xray elbow/shoulder 1/28 negative Suspect myofascial pain +/- frozen shoulder - improved shoulder ROM, still with upper trap trigger point -   2/3 pt received trigger point injection yesterday. ?improvement--was focused on other issues today Has pec and trap soreness with increased walker use , MSK chest pain trat symptomatically  15. Marijuana use             -Advise cessation, associated with increased stroke risk 26-44% depending on study   -2/3 uds + yesterday for thc, benzos. Urine drug profile sent to [facility], pending  -f/u UDS c/w methadone  use, THC 107 16. Hx of pituitary tumor             -f/u Neurosurgery 17. Low protein S             -f/u Hematology outpatient, will need retesting in ~33mo  18.  Hx sensorineural hearing loss with resultant dysarthria  19.  LUE numbness C5 distribution shoulder  pain , ? Proximal weakness vs pain inhibition , only mild C4-5 stenosis on MRI  , MRI reviewed with Neuroradiology , no spinal cord signal abnormalities Neurosurg, Dr Dawley looked at MRI , C spine looks stable  Still has subjective numbess in left shoulder   20.  Insomnia- likely multifactorial, anxiety regarding medical condition, underlying anxiety d/o and on higher dose steroid taper - expect some improvement with steroid taper ,  - hydroxyzine  for anxiety  -sleeping consistently now 21.  Seizure like episode,  Prior hx but not well documented in medical record, recent Left pontine infarct (lower incidence of seizure for this anatomic location vs cortical) also with empty sella syndrome with diagnosis of addison's disease , already on moderate dose Keppra -   -CT of head without changes  -EEG without sz activity   D/c'ed LTM EEG per neurology  LOS: 12 days A FACE TO FACE EVALUATION WAS PERFORMED  Genetta Kenning 05/13/2023, 8:53 AM    7

## 2023-05-14 DIAGNOSIS — G5621 Lesion of ulnar nerve, right upper limb: Secondary | ICD-10-CM | POA: Diagnosis not present

## 2023-05-14 DIAGNOSIS — I639 Cerebral infarction, unspecified: Secondary | ICD-10-CM | POA: Diagnosis not present

## 2023-05-14 DIAGNOSIS — F1129 Opioid dependence with unspecified opioid-induced disorder: Secondary | ICD-10-CM | POA: Diagnosis not present

## 2023-05-14 DIAGNOSIS — M25512 Pain in left shoulder: Secondary | ICD-10-CM | POA: Diagnosis not present

## 2023-05-14 MED ORDER — LAMOTRIGINE 25 MG PO TABS: 50.0000 mg | ORAL_TABLET | Freq: Two times a day (BID) | ORAL | Status: DC

## 2023-05-14 MED ORDER — ARIPIPRAZOLE 10 MG PO TABS
10.0000 mg | ORAL_TABLET | Freq: Every day | ORAL | Status: DC
  Administered 2023-05-15: 10 mg via ORAL
  Filled 2023-05-14 (×2): qty 1

## 2023-05-14 MED ORDER — OXYCODONE-ACETAMINOPHEN 5-325 MG PO TABS
1.0000 | ORAL_TABLET | Freq: Every day | ORAL | Status: DC | PRN
  Administered 2023-05-14: 1 via ORAL
  Filled 2023-05-14: qty 1

## 2023-05-14 MED ORDER — LAMOTRIGINE 25 MG PO TABS
25.0000 mg | ORAL_TABLET | Freq: Two times a day (BID) | ORAL | Status: DC
  Administered 2023-05-14 – 2023-05-18 (×8): 25 mg via ORAL
  Filled 2023-05-14 (×9): qty 1

## 2023-05-14 MED ORDER — GABAPENTIN 300 MG PO CAPS
300.0000 mg | ORAL_CAPSULE | Freq: Three times a day (TID) | ORAL | Status: DC
  Administered 2023-05-14 (×2): 300 mg via ORAL
  Filled 2023-05-14 (×2): qty 1

## 2023-05-14 NOTE — Plan of Care (Signed)
  Problem: RH Simple Meal Prep Goal: LTG Patient will perform simple meal prep w/assist (OT) Description: LTG: Patient will perform simple meal prep with assistance, with/without cues (OT). Flowsheets (Taken 05/14/2023 1216) LTG: Pt will perform simple meal prep with assistance level of: Independent with assistive device LTG: Pt will perform simple meal prep w/level of: Ambulate with device   Problem: RH Light Housekeeping Goal: LTG Patient will perform light housekeeping w/assist (OT) Description: LTG: Patient will perform light housekeeping with assistance, with/without cues (OT). Flowsheets (Taken 05/14/2023 1216) LTG: Pt will perform light housekeeping with assistance level of: Independent with assistive device LTG: Pt will perform light housekeeping w/level of: Ambulate with device

## 2023-05-14 NOTE — Progress Notes (Signed)
Patient ID: Brittany Mullins, female   DOB: Jun 23, 1980, 43 y.o.   MRN: [id number]  SW met with pt and family in room to provide updates from team conference, and review discharge. SW will order rollator and 3in1 BSC. P only wants TTB since now approved for new apartment which is now handicap accessible. No HHA preference.   SW ordered DME with [employer] via parachute.   SW sent HHPT/OT/LP/aide  referral to [facility] and waiting on follow-up.  Cecile Sheerer, MSW, LCSW Office: [phone] Cell: [phone] Fax: [phone]

## 2023-05-14 NOTE — Progress Notes (Addendum)
Speech Language Pathology Daily Session Note  Patient Details  Name: Brittany Mullins MRN: [id number] Date of Birth: 1980/05/08  Today's Date: 05/14/2023 SLP Individual Time: 0805-0903 SLP Individual Time Calculation (min): 58 min  Short Term Goals: Week 2: SLP Short Term Goal 1 (Week 2): STG = LTG due to ELOS  Skilled Therapeutic Interventions:  Patient was seen in am to address cognitive re- training. Pt was alert and seen at bedside with MD and RN at bedside for morning rounds and medication administration. She was agreeable for session. SLP challenged pt in recall and problem solving. Pt able to recall memory strategies through use of external aid posted in room. She identified examples of using association strategy by recalling how she remembers her therapist names. She endorsed some difficulty with writing post CVA. SLP introduced use of her phone as an external aid with focus on notes and alarm clock app. SLP  assisted pt in locating apps on her phone as well as showed pt an example of utilizing notes app on clinician phone. Pt verbalized interest in strategy. SLP subsequently challenged pt in paragraph retention. Given moderate level information presented verbally, SLP guided pt in utilization of repetition and association strategies. Given a delay, she recalled information with 100% acc with min A. SLP addressed problem solving through challenging pt in interpretation of prescription labels. Pt completed task with 67% acc indep improving to 83% acc with min A. At conclusion of session, pt was left in bed with call button within reach and bed alarm active. SLP to continue POC.   Pain    Therapy/Group: Individual Therapy  Renaee Munda 05/14/2023, 2:41 PM

## 2023-05-14 NOTE — Progress Notes (Signed)
Occupational Therapy Session Note  Patient Details  Name: Brittany Mullins MRN: [id number] Date of Birth: 01-10-1981  Today's Date: 05/14/2023 OT Individual Time: 1000-1045 OT Individual Time Calculation (min): 45 min    Short Term Goals: Week 1:  OT Short Term Goal 1 (Week 1): pt will demonstrate improved use of L shoulder to don shirt with min A. OT Short Term Goal 1 - Progress (Week 1): Met OT Short Term Goal 2 (Week 1): Pt will be able to stand with min A to pull pants over hips. OT Short Term Goal 2 - Progress (Week 1): Met OT Short Term Goal 3 (Week 1): pt will be able to safely use RW to ambulate to toilet with mod A. OT Short Term Goal 3 - Progress (Week 1): Met Week 2:  OT Short Term Goal 1 (Week 2): STG= LTGs  Skilled Therapeutic Interventions/Progress Updates:    Pt received in wc eager to shower. Pt used rollator to ambulate in room to gather supplies, walk to bathroom to toilet, shower and then to her bed to dress.  Pt moving efficiently and using BUE well.  She only had one slight LOB posteriorly when she was standing to dry off and was trying to drape a large towel around her back and stepping back at the same time. The was in front of the tub bench so it stabilized her.   Pt discussed how she has been having cramping in her R hand in 3rd to 5th digits when sleeping and when trying to use her hand. Had pt work on wrist extension stretches and showed pt how to do self massage to forearm to relax her muscles.  Pt felt this helped to relax her hand.   Pt did express interest in community support groups, will ensure she had information on that.   Pt resting in wc with all needs met and alarm on.   Therapy Documentation Precautions:  Precautions Precautions: Fall Restrictions Weight Bearing Restrictions Per Provider Order: No    Pain: Pain Assessment Pain Scale: 0-10 Pain Score: 6  Pain Location: Neck Pain Descriptors / Indicators: Aching ADL: ADL Eating:  Independent Grooming: Modified independent Where Assessed-Grooming: Sitting at sink Upper Body Bathing: Modified independent Where Assessed-Upper Body Bathing: Shower Lower Body Bathing: Setup Where Assessed-Lower Body Bathing: Shower Upper Body Dressing: Modified independent (Device) Where Assessed-Upper Body Dressing: Wheelchair Lower Body Dressing: Setup Where Assessed-Lower Body Dressing: Wheelchair Toileting: Supervision/safety Where Assessed-Toileting: Teacher, adult education: Close supervision Statistician Method: Proofreader: Acupuncturist: Close supervision Film/video editor Method: Designer, industrial/product: Emergency planning/management officer, Grab bars   Therapy/Group: Individual Therapy  Vanetta Rule 05/14/2023, 12:17 PM

## 2023-05-14 NOTE — Progress Notes (Signed)
PROGRESS NOTE   Subjective/Complaints:  RLE burning pain at noc improved but has more discomfort this am , discussed post CVA neurogenic pain  No back pain   ROS: Patient denies nausea, vomiting, diarrhea, cough, shortness of breath or chest pain,       Objective:   No results found.  Recent Labs    05/13/23 0532  WBC 5.5  HGB 12.4  HCT 37.4  PLT 263    Recent Labs    05/13/23 0532  NA 140  K 4.0  CL 99  CO2 26  GLUCOSE 81  BUN 20  CREATININE 0.61  CALCIUM 9.4     Intake/Output Summary (Last 24 hours) at 05/14/2023 0803 Last data filed at 05/14/2023 0738 Gross per 24 hour  Intake 840 ml  Output --  Net 840 ml        Physical Exam: Vital Signs Blood pressure 102/62, pulse 67, temperature 98.5 F (36.9 C), resp. rate 17, height 5\' 2"  (1.575 m), weight 70.3 kg, SpO2 94%.   General: No acute distress Mood and affect are appropriate Heart: Regular rate and rhythm no rubs murmurs or extra sounds Lungs: Clear to auscultation, breathing unlabored, no rales or wheezes Abdomen: Positive bowel sounds, soft nontender to palpation, nondistended Extremities: No clubbing, cyanosis, or edema  Skin: No evidence of breakdown, no evidence of rash Neurologic: Cranial nerves II through XII intact, motor strength is 3- /5 in bilateral deltoid, bicep, tricep,RIght  grip, 5/5 Left grip, 5/5 left and 4/5 right hip flexor, knee extensors, ankle dorsiflexor and plantar flexor--stable exam 2/9 Some pain inhibition LUE  Sensory exam hypersensitivity LLE thigh to foot, no joint swelling or ROM restriction , neg SLR, foot warm pulses present R DP  Cerebellar exam limited by strength on right and pain on left  Musculoskeletal: Full range of motion in all 4 extremities. No joint swelling. Myofascial pain of shoulder girdle   Assessment/Plan: 1. Functional deficits which require 3+ hours per day of interdisciplinary therapy  in a comprehensive inpatient rehab setting. Physiatrist is providing close team supervision and 24 hour management of active medical problems listed below. Physiatrist and rehab team continue to assess barriers to discharge/monitor patient progress toward functional and medical goals  Care Tool:  Bathing    Body parts bathed by patient: Abdomen, Chest, Front perineal area, Right upper leg, Left upper leg, Left lower leg, Face, Left arm, Right arm, Buttocks, Right lower leg   Body parts bathed by helper: Right lower leg, Buttocks, Right arm, Left arm     Bathing assist Assist Level: Supervision/Verbal cueing     Upper Body Dressing/Undressing Upper body dressing   What is the patient wearing?: Bra, Pull over shirt    Upper body assist Assist Level: Independent with assistive device    Lower Body Dressing/Undressing Lower body dressing      What is the patient wearing?: Underwear/pull up, Pants     Lower body assist Assist for lower body dressing: Supervision/Verbal cueing     Toileting Toileting    Toileting assist Assist for toileting: Supervision/Verbal cueing     Transfers Chair/bed transfer  Transfers assist     Chair/bed transfer  assist level: Contact Guard/Touching assist     Locomotion Ambulation   Ambulation assist      Assist level: Supervision/Verbal cueing Assistive device: Walker-rolling Max distance: 200   Walk 10 feet activity   Assist     Assist level: Supervision/Verbal cueing Assistive device: Walker-rolling   Walk 50 feet activity   Assist Walk 50 feet with 2 turns activity did not occur: Safety/medical concerns  Assist level: Supervision/Verbal cueing Assistive device: Walker-rolling    Walk 150 feet activity   Assist Walk 150 feet activity did not occur: Safety/medical concerns  Assist level: Supervision/Verbal cueing Assistive device: Walker-rolling    Walk 10 feet on uneven surface  activity   Assist Walk  10 feet on uneven surfaces activity did not occur: Safety/medical concerns   Assist level: Supervision/Verbal cueing Assistive device: Walker-rolling   Wheelchair     Assist Is the patient using a wheelchair?: Yes Type of Wheelchair: Manual    Wheelchair assist level: Supervision/Verbal cueing Max wheelchair distance: 100    Wheelchair 50 feet with 2 turns activity    Assist        Assist Level: Supervision/Verbal cueing   Wheelchair 150 feet activity     Assist      Assist Level: Maximal Assistance - Patient 25 - 49%   Blood pressure 102/62, pulse 67, temperature 98.5 F (36.9 C), resp. rate 17, height 5\' 2"  (1.575 m), weight 70.3 kg, SpO2 94%.  Medical Problem List and Plan: 1. Functional deficits secondary to Lft ventral pontine CVA, likely small vessel disease             -patient may shower             -ELOS/Goals: 2/14   Mod I to Sup PT/OT/SLP         -Continue CIR therapies including PT, OT, and SLP  2.  Antithrombotics: -DVT/anticoagulation:  Pharmaceutical: Lovenox             -antiplatelet therapy: DAPT X 3 weeks followed by ASA alone.  3. Pain Management: On Methadone (followed by [facility]).             --now on prn oxycodone due to shoulder/arm pain reduced to 1 tab oxy IR 5, pt asking for 2 tabs, discussed concerns for sedation ( also hx OUD)             --gabapentin TID for neuropathy, increase qh dose  Slept ok but more burning RLE this am  Change gabapentin to 300mg  TID 4. H/o bipolar d/o/Mood/Behavior/Sleep: LCSW to follow for evaluation and support.              -2/3 psych recommended abilify 7.5mg  q am and lamictal 25mg  bid   -abilify begins today, lamictal started yesterday  2/8-9 has had a great weekend--continue to provide ego support   -same meds             -antipsychotic agents: N/A--has not been on any psych meds for years.  5. Neuropsych/cognition: This patient is capable of making decisions on her own behalf. 6. Skin/Wound  Care: Routine pressure relief measures. 7. Fluids/Electrolytes/Nutrition: Monitor I/O.    2/3 potassium 4.0, continue daily supplement      Latest Ref Rng & Units 05/13/2023    5:32 AM 05/09/2023    5:48 AM 05/06/2023    5:18 AM  BMP  Glucose 70 - 99 mg/dL 81  71  74   BUN 6 - 20 mg/dL 20  18  22   Creatinine 0.44 - 1.00 mg/dL 8.41  3.24  4.01   Sodium 135 - 145 mmol/L 140  137  139   Potassium 3.5 - 5.1 mmol/L 4.0  4.0  4.0   Chloride 98 - 111 mmol/L 99  99  105   CO2 22 - 32 mmol/L 26  27  27    Calcium 8.9 - 10.3 mg/dL 9.4  8.9  8.8     8. HTN: Monitor BP TID--continue Cozaar and Norvasc.    Bp's currently tolerable 9. 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 Addison's disease: Now on Cortef 10 mg am/5 mg pm 11. Chronic pancreatitis: Has chronic issues with nausea --continue phenergan --continue PPI Vomiting yesterday abd exam normal  BM this am  Possibility of new meds causing nausea  13. Constipation: Reports rectal bleeding-->colace added to senna due to hx of hems.   14. Left arm shoulder/elbow pain             -Xray elbow/shoulder 1/28 negative Suspect myofascial pain +/- frozen shoulder - improved shoulder ROM, still with upper trap trigger point -   2/3 pt received trigger point injection yesterday. ?improvement--was focused on other issues today Has pec and trap soreness with increased walker use , MSK chest pain trat symptomatically  15. Marijuana use             -Advise cessation, associated with increased stroke risk 26-44% depending on study   -2/3 uds + yesterday for thc, benzos. Urine drug profile sent to [facility], pending  -f/u UDS c/w methadone use, THC 107 16. Hx of pituitary tumor             -f/u Neurosurgery 17. Low protein S             -f/u Hematology outpatient, will need retesting in ~32mo  18.  Hx sensorineural hearing loss with resultant dysarthria  19.  LUE numbness C5  distribution shoulder pain , ? Proximal weakness vs pain inhibition , only mild C4-5 stenosis on MRI  , MRI reviewed with Neuroradiology , no spinal cord signal abnormalities Neurosurg, Dr Dawley looked at MRI , C spine looks stable  Still has subjective numbess in left shoulder   20.  Insomnia- likely multifactorial, anxiety regarding medical condition, underlying anxiety d/o and on higher dose steroid taper - expect some improvement with steroid taper ,  - hydroxyzine for anxiety  -sleeping consistently now 21.  Seizure like episode,  Prior hx but not well documented in medical record, recent Left pontine infarct (lower incidence of seizure for this anatomic location vs cortical) also with empty sella syndrome with diagnosis of addison's disease , already on moderate dose Keppra-   -CT of head without changes  -EEG without sz activity   D/c'ed LTM EEG per neurology  LOS: 13 days A FACE TO FACE EVALUATION WAS PERFORMED  Erick Colace 05/14/2023, 8:03 AM    7

## 2023-05-14 NOTE — Progress Notes (Signed)
Physical Therapy Session Note  Patient Details  Name: Brittany Mullins MRN: [id number] Date of Birth: 02-21-1981  Today's Date: 05/14/2023 PT Individual Time: 8119-1478; 1428 - 1510 PT Individual Time Calculation (min): 40 min; 42 min   Short Term Goals: Week 2:  PT Short Term Goal 1 (Week 2): = LTGs  SESSION 1 Skilled Therapeutic Interventions/Progress Updates: Patient supine in bed on entrance to room. Patient alert and agreeable to PT session.   Patient reported unrated pain in L UE (tightness) and some burning sensations in R LE (pt reported attending MD stated it was due to "nerves coming back."  Therapeutic Activity: Bed Mobility: Pt performed supine<sit on EOB with modI (use of railing). Pt sat on EOB and donned personal shoes with supervision for safety and no issue. Transfers: Pt performed sit<>stand transfers with rollator and with supervision throughout session. Provided VC for hand placement.  Gait Training:  Pt ambulated from room<>main gym using rollator with supervision and VC for heel-strike B LE, and to increase step clearance. Pt with improved step pattern following therex interventions   Therapeutic Exercise: Pt performed the following exercises with therapist providing the described cuing and facilitation for improvement. - Soleus stretch with B LE on balance board while short sitting 10s holds - Gastrocnemius stretch with R LE on red wedge x 5 with 5-10s holds (VC to extend knee as much as able to stretch).  - AROM of R LE sitting edge of mat (pt cued to only move within pain/tightness limitations). - Contract-Contract of R HS with cues for R foot to stay in some plantarflexion to avoid over straining gastroc.    Manual Therapy: - Light STM on R gastrocnemius due to reports of pain/tightness. Pt seated back on edge of hi/low mat with R knee in slight flexion (edge on superior gastroc - this position was most comfortable).  Patient sitting in WC at end  of session with brakes locked, belt alarm set, and all needs within reach.  SESSION 2 Skilled Therapeutic Interventions/Progress Updates: Patient sitting in WC on entrance to room. Patient alert and agreeable to PT session.   Patient reported unrated pain in R calve musculature and B UE (unrated and premedicated). Rest breaks provided prn throughout session  Therapeutic Activity: Bed Mobility: Pt performed sit<supine from EOB with modI. Pt educated to avoid using pillows underneath B knees as much as able while in bed in order to decrease risk of flexor contractures as pt already has tightness in reported areas.  Transfers: Pt performed standing transfers with rollator throughout session with supervision. VC provided to ensure brakes are engaged (once, then pt recalled rest of session).  Gait Training:  Pt ambulated from room<>main gym using rollator with supervision. Pt with carryover of heel-strike on B LE's from morning session. Pt with forward flexed posture with VC to extend through hips (B knees continue to stay in slight flexion due to tight HS and gastroc.)  Pt ambulated roughly 300' around main gym and hallway without AD and with R HHA at first, then quickly progressed to no UE support. Pt with decrease in cadence and B step length. Pt with R UE maintained in elbow flexion throughout (cued to relax towards side with pt decrease in dynamic standing balance - no true LOB and grab of L HR to assist/adjust - pt educated on importance of allowing reciprocal swing while ambulating (pt to work towards on final days during inpatient stay, and during PT following d/c). Pt also with same  B knee flexion (R>L) and hip flexion with educated on using AD after d/c due to decreased dynamic balance, and that current presentation of standing posture increases fall risk.  Therapeutic Exercise: Pt performed the following exercises with therapist providing the described cuing and facilitation for  improvement. - R HS stretch while long sitting on hi/low mat (R focus due to R having more tightness vs L). Pt provided with VC to bring R knee down to mat to increase stretch (unable to press knee into mat without increased pain - pt cued to stay within available AROM during stretch). - x 10 LAQ with R LE sitting edge of mat  - Progressed to yellow theraband (pt cued to hold isometrically at peak AROM of HS flexion - roughly 90*). Pt able to increase past 90* on final reps of both sets (2 x 8). Pt cued to perform active rest break by moving R ankle through dorsiflexion and plantarflexion. Pt also cued throughout sets to maintain plantarflexion during LAQ to lengthen gastrocnemius for HS bias. Pt provided with short and gentle R foot arch STM following therex due to reports of pain (pt reported decrease in pain after).   Patient supine in bed at end of session with brakes locked, bed alarm set, and all needs within reach.       Therapy Documentation Precautions:  Precautions Precautions: Fall Restrictions Weight Bearing Restrictions Per Provider Order: No  Therapy/Group: Individual Therapy  Anastacia Reinecke PTA 05/14/2023, 12:26 PM

## 2023-05-15 DIAGNOSIS — G5621 Lesion of ulnar nerve, right upper limb: Secondary | ICD-10-CM | POA: Diagnosis not present

## 2023-05-15 DIAGNOSIS — I639 Cerebral infarction, unspecified: Secondary | ICD-10-CM | POA: Diagnosis not present

## 2023-05-15 DIAGNOSIS — F1129 Opioid dependence with unspecified opioid-induced disorder: Secondary | ICD-10-CM | POA: Diagnosis not present

## 2023-05-15 DIAGNOSIS — M25512 Pain in left shoulder: Secondary | ICD-10-CM | POA: Diagnosis not present

## 2023-05-15 LAB — GLUCOSE, CAPILLARY
Glucose-Capillary: 122 mg/dL — ABNORMAL HIGH (ref 70–99)
Glucose-Capillary: 102 mg/dL — ABNORMAL HIGH (ref 70–99)

## 2023-05-15 MED ORDER — ARIPIPRAZOLE 5 MG PO TABS
5.0000 mg | ORAL_TABLET | Freq: Every day | ORAL | Status: DC
  Administered 2023-05-16 – 2023-05-18 (×3): 5 mg via ORAL
  Filled 2023-05-15 (×3): qty 1

## 2023-05-15 MED ORDER — LEVETIRACETAM IN NACL 1000 MG/100ML IV SOLN
1000.0000 mg | Freq: Once | INTRAVENOUS | Status: DC
  Filled 2023-05-15: qty 100

## 2023-05-15 MED ORDER — GABAPENTIN 400 MG PO CAPS
400.0000 mg | ORAL_CAPSULE | Freq: Every day | ORAL | Status: DC
  Administered 2023-05-15 – 2023-05-17 (×3): 400 mg via ORAL
  Filled 2023-05-15 (×3): qty 1

## 2023-05-15 MED ORDER — GABAPENTIN 300 MG PO CAPS
300.0000 mg | ORAL_CAPSULE | Freq: Two times a day (BID) | ORAL | Status: DC
  Administered 2023-05-15 – 2023-05-18 (×5): 300 mg via ORAL
  Filled 2023-05-15 (×6): qty 1

## 2023-05-15 MED ORDER — LORAZEPAM 2 MG/ML IJ SOLN
0.5000 mg | Freq: Once | INTRAMUSCULAR | Status: AC | PRN
  Administered 2023-05-16: 0.5 mg via INTRAVENOUS
  Filled 2023-05-15: qty 1

## 2023-05-15 NOTE — Progress Notes (Signed)
Occupational Therapy Session Note  Patient Details  Name: Brittany Mullins MRN: [id number] Date of Birth: 1980/11/11  Today's Date: 05/15/2023 OT Individual Time: [phone] [id number] OT Individual Time Calculation (min): 73 min    Short Term Goals: Week 2:  OT Short Term Goal 1 (Week 2): STG= LTGs  Skilled Therapeutic Interventions/Progress Updates:  Session 1: Pt greeted supine in bed, pt agreeable to OT intervention.      Transfers/bed mobility/functional mobility: pt completed supine>sit MODI with use of bed features.pt completed ambulatory ADL transfers with rollator MODI. Pt even able to collect clothes for shower and lay down her towels with use of rollator to transport items. No LOB.    ADLs:  Grooming: pt completed seated grooming tasks MODI, did discuss that in her new apt she can sit on her rollator and complete grooming at sink.  UB dressing:donned bra and OH shirt MODI as pt even able to collect clothes with rollator  LB dressing: pt donned pants/underwear MODI Footwear: pt donned socks MODI via figure 4   Bathing: pt completed bathing both seated/standing with use of grab bar MODI.  Transfers: ambulatory ADL transfers with rollator MODI Toileting: continent urine void, pt completed 3/3 toileting tasks with MODI    Exercises: pt has been c/o pain in R ulnar nerve area, educated pt on below therex for pain mgmt:   Exercises - Standing Ulnar Nerve Glide  - 1 x daily - 7 x weekly - 3 sets - 10 reps - Ulnar Nerve Flossing  - 1 x daily - 7 x weekly - 3 sets - 10 reps - Ulnar Nerve Glide- Full Arm  - 1 x daily - 7 x weekly - 3 sets - 10 reps  Ended session with pt seated in w/c with all needs within reach.                Session 2: Pt greeted supine in bed with son ( corey) and DIL Engineer, maintenance (IT)) present for family ed , pt agreeable to OT intervention.      Transfers/bed mobility/functional mobility: pt completed all functional ambulation with rollator greater  than a household distance MODI.  Practice floor transfer with education provided on supine>sidelying>quadraped technique with pt then able to push up into standing by pulling up on mat table. Pt completed task x2 with pt able to complete floor transfer with CGA.   family education provided to corey and [name] on the below topics: -recommendation on use of Rollator for functional ambulation - general assist needed for ADLS and functional mobility ( I.e supervision- MODI) -decreasing clutter/fall hazards in the home - discussed DME needs -education provided on shower transfers to TTB, pt able to demo ambulatory transfer to TTB with rollator MODI -educated family on shower safety  -education provided on IADLs pt can complete using compensatory methods as needed.  -pt demonstrated ability to complete ambulatory transfers in apt to flat [facility], recliner and couch MODI -recommended pt get settled safely in her home first and then let her dog greet her from sitting position to decrease risk for fall.   Family demonstrated appropriate level of assist with ADLs and functional mobility   IADLS:pt completed IADL task of making up the bed in apt with rollator and distant supervision. Pt additionally able to collect ADL items from various surface heights even from floor level with rollator and supervision. No LOB    Ended session with pt supine in bed with all needs within reach and family present.  Therapy Documentation Precautions:  Precautions Precautions: Fall Restrictions Weight Bearing Restrictions Per Provider Order: No  Pain: no pain reported during either session     Therapy/Group: Individual Therapy  Pollyann Glen [facility] 05/15/2023, 12:08 PM

## 2023-05-15 NOTE — Progress Notes (Signed)
Patient expressed frustration with nurse for not being able to administer pain meds and insisted on taking Trazodone and Flexeril together, despite Trazodone being scheduled for bedtime PRN.  Nurse contacted PA Pam for guidance. PA approved administration as requested by patient.

## 2023-05-15 NOTE — Significant Event (Signed)
Rapid Response Event Note   Reason for Call :  Seizure like activity  Initial Focused Assessment:  On arrival, no seizure like activity noted. Pt A&O. Visitor at bedside. Pt states she felt "funny" prior to event.   VS: HR 91, SBP 137, RR 19, spO2 99% on RA.    Interventions:  Vitals [id number] Provider notified.  Plan of Care:  Continue to monitor for s/s of seizures. RN instructed to call with any changes or concerns   Event Summary:   MD Notified: Cipriano Mile- PA Call Time: 2224 Arrival [id number] End Time: 2243  2254: Called back for same issue. This time pt not responding per RN. On arrival pt A&O states she is tired. Vitals unchanged. While in assessing and talking with pt, she had another episode of jerking to mainly L side of body. Pt able to follow commands throughout the episode and talk one word answers. VSS, Provider aware. IV team consulted, IV Keppra, CT head and EEG ordered.   Mordecai Rasmussen, RN

## 2023-05-15 NOTE — Progress Notes (Signed)
PROGRESS NOTE   Subjective/Complaints:  Discussed f/u with Psych at [facility], also pt needs MAR of meds given during her hospital stay   ROS: Patient denies nausea, vomiting, diarrhea, cough, shortness of breath or chest pain,       Objective:   No results found.  Recent Labs    05/13/23 0532  WBC 5.5  HGB 12.4  HCT 37.4  PLT 263    Recent Labs    05/13/23 0532  NA 140  K 4.0  CL 99  CO2 26  GLUCOSE 81  BUN 20  CREATININE 0.61  CALCIUM 9.4     Intake/Output Summary (Last 24 hours) at 05/15/2023 0858 Last data filed at 05/14/2023 1324 Gross per 24 hour  Intake 240 ml  Output --  Net 240 ml        Physical Exam: Vital Signs Blood pressure 120/85, pulse 70, temperature 97.9 F (36.6 C), resp. rate 16, height 5\' 2"  (1.575 m), weight 70.3 kg, SpO2 98%.   General: No acute distress Mood and affect are appropriate Heart: Regular rate and rhythm no rubs murmurs or extra sounds Lungs: Clear to auscultation, breathing unlabored, no rales or wheezes Abdomen: Positive bowel sounds, soft nontender to palpation, nondistended Extremities: No clubbing, cyanosis, or edema  Skin: No evidence of breakdown, no evidence of rash Neurologic: Cranial nerves II through XII intact, motor strength is 3- /5 in bilateral deltoid, bicep, tricep,RIght  grip, 5/5 Left grip, 5/5 left and 4/5 right hip flexor, knee extensors, ankle dorsiflexor and plantar flexor--stable exam 2/9 Some pain inhibition LUE  Sensory exam hypersensitivity LLE thigh to foot, no joint swelling or ROM restriction , neg SLR,   Musculoskeletal: Full range of motion in all 4 extremities. No joint swelling. Myofascial pain of shoulder girdle improved    Assessment/Plan: 1. Functional deficits which require 3+ hours per day of interdisciplinary therapy in a comprehensive inpatient rehab setting. Physiatrist is providing close team supervision and 24  hour management of active medical problems listed below. Physiatrist and rehab team continue to assess barriers to discharge/monitor patient progress toward functional and medical goals  Care Tool:  Bathing    Body parts bathed by patient: Abdomen, Chest, Front perineal area, Right upper leg, Left upper leg, Left lower leg, Face, Left arm, Right arm, Buttocks, Right lower leg   Body parts bathed by helper: Right lower leg, Buttocks, Right arm, Left arm     Bathing assist Assist Level: Supervision/Verbal cueing     Upper Body Dressing/Undressing Upper body dressing   What is the patient wearing?: Bra, Pull over shirt    Upper body assist Assist Level: Independent with assistive device    Lower Body Dressing/Undressing Lower body dressing      What is the patient wearing?: Underwear/pull up, Pants     Lower body assist Assist for lower body dressing: Supervision/Verbal cueing     Toileting Toileting    Toileting assist Assist for toileting: Supervision/Verbal cueing     Transfers Chair/bed transfer  Transfers assist     Chair/bed transfer assist level: Contact Guard/Touching assist     Locomotion Ambulation   Ambulation assist  Assist level: Supervision/Verbal cueing Assistive device: Walker-rolling Max distance: 200   Walk 10 feet activity   Assist     Assist level: Supervision/Verbal cueing Assistive device: Walker-rolling   Walk 50 feet activity   Assist Walk 50 feet with 2 turns activity did not occur: Safety/medical concerns  Assist level: Supervision/Verbal cueing Assistive device: Walker-rolling    Walk 150 feet activity   Assist Walk 150 feet activity did not occur: Safety/medical concerns  Assist level: Supervision/Verbal cueing Assistive device: Walker-rolling    Walk 10 feet on uneven surface  activity   Assist Walk 10 feet on uneven surfaces activity did not occur: Safety/medical concerns   Assist level:  Supervision/Verbal cueing Assistive device: Walker-rolling   Wheelchair     Assist Is the patient using a wheelchair?: Yes Type of Wheelchair: Manual    Wheelchair assist level: Supervision/Verbal cueing Max wheelchair distance: 100    Wheelchair 50 feet with 2 turns activity    Assist        Assist Level: Supervision/Verbal cueing   Wheelchair 150 feet activity     Assist      Assist Level: Maximal Assistance - Patient 25 - 49%   Blood pressure 120/85, pulse 70, temperature 97.9 F (36.6 C), resp. rate 16, height 5\' 2"  (1.575 m), weight 70.3 kg, SpO2 98%.  Medical Problem List and Plan: 1. Functional deficits secondary to Lft ventral pontine CVA, likely small vessel disease             -patient may shower             -ELOS/Goals: 2/14   Mod I to Sup PT/OT/SLP         -Continue CIR therapies including PT, OT, and SLP  2.  Antithrombotics: -DVT/anticoagulation:  Pharmaceutical: Lovenox             -antiplatelet therapy: DAPT X 3 weeks followed by ASA alone.  3. Pain Management: On Methadone (followed by [facility]).             --now on prn oxycodone due to shoulder/arm pain reduced to 1 tab oxy IR 5, pt asking for 2 tabs, discussed concerns for sedation ( also hx OUD)             --gabapentin TID for neuropathy, increase qh dose  Slept ok but more burning RLE this am  Change gabapentin to 300mg  BID and 400mg  at bedtime  4. H/o bipolar d/o/Mood/Behavior/Sleep: LCSW to follow for evaluation and support.              -2/3 psych recommended abilify 7.5mg  q am and lamictal 25mg  bid   -2/12- per psych can titrate Lamictal q 2wks, so too early , can titrate abilify, increased to 10mg  today   2/8-9 has had a great weekend--continue to provide ego support   -same meds             -antipsychotic agents: N/A--has not been on any psych meds for years.  5. Neuropsych/cognition: This patient is capable of making decisions on her own behalf. 6. Skin/Wound Care: Routine  pressure relief measures. 7. Fluids/Electrolytes/Nutrition: Monitor I/O.    2/10 potassium 4.0, continue daily supplement      Latest Ref Rng & Units 05/13/2023    5:32 AM 05/09/2023    5:48 AM 05/06/2023    5:18 AM  BMP  Glucose 70 - 99 mg/dL 81  71  74   BUN 6 - 20 mg/dL 20  18  22  Creatinine 0.44 - 1.00 mg/dL 1.61  0.96  0.45   Sodium 135 - 145 mmol/L 140  137  139   Potassium 3.5 - 5.1 mmol/L 4.0  4.0  4.0   Chloride 98 - 111 mmol/L 99  99  105   CO2 22 - 32 mmol/L 26  27  27    Calcium 8.9 - 10.3 mg/dL 9.4  8.9  8.8     8. HTN: Monitor BP TID--continue Cozaar and Norvasc.    Bp's currently tolerable 9. 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 Addison's disease: Now on Cortef 10 mg am/5 mg pm 11. Chronic pancreatitis: Has chronic issues with nausea --continue phenergan --continue PPI Vomiting yesterday abd exam normal  BM this am  Possibility of new meds causing nausea  13. Constipation: Reports rectal bleeding-->colace added to senna due to hx of hems.   14. Left arm shoulder/elbow pain             -Xray elbow/shoulder 1/28 negative Suspect myofascial pain +/- frozen shoulder - improved shoulder ROM, still with upper trap trigger point -   2/3 pt received trigger point injection yesterday. ?improvement--was focused on other issues today Has pec and trap soreness with increased walker use , MSK chest pain trat symptomatically  15. Marijuana use             -Advise cessation, associated with increased stroke risk 26-44% depending on study   -2/3 uds + yesterday for thc, benzos. Urine drug profile sent to [facility], pending  -f/u UDS c/w methadone use, THC 107 16. Hx of pituitary tumor             -f/u Neurosurgery 17. Low protein S             -f/u Hematology outpatient, will need retesting in ~68mo  18.  Hx sensorineural hearing loss with resultant dysarthria  19.  LUE numbness C5 distribution shoulder  pain , ? Proximal weakness vs pain inhibition , only mild C4-5 stenosis on MRI  , MRI reviewed with Neuroradiology , no spinal cord signal abnormalities Neurosurg, Dr Dawley looked at MRI , C spine looks stable  Still has subjective numbess in left shoulder   20.  Insomnia- likely multifactorial, anxiety regarding medical condition, underlying anxiety d/o and on higher dose steroid taper - expect some improvement with steroid taper ,  - hydroxyzine for anxiety  -sleep disturbed by neurogenic pain  21.  Seizure like episode,  Prior hx but not well documented in medical record, recent Left pontine infarct (lower incidence of seizure for this anatomic location vs cortical) also with empty sella syndrome with diagnosis of addison's disease , already on moderate dose Keppra-   -CT of head without changes  -EEG without sz activity   D/c'ed LTM EEG per neurology  LOS: 14 days A FACE TO FACE EVALUATION WAS PERFORMED  Erick Colace 05/15/2023, 8:58 AM    7

## 2023-05-15 NOTE — Plan of Care (Signed)
Problem: Consults Goal: RH STROKE PATIENT EDUCATION Description: See Patient Education module for education specifics  Outcome: Progressing   Problem: RH BOWEL ELIMINATION Goal: RH STG MANAGE BOWEL WITH ASSISTANCE Description: STG Manage Bowel with mod I Assistance. Outcome: Progressing Goal: RH STG MANAGE BOWEL W/MEDICATION W/ASSISTANCE Description: STG Manage Bowel with Medication with mod I Assistance. Outcome: Progressing   Problem: RH SAFETY Goal: RH STG ADHERE TO SAFETY PRECAUTIONS W/ASSISTANCE/DEVICE Description: STG Adhere to Safety Precautions With cues Assistance/Device. Outcome: Progressing   Problem: RH PAIN MANAGEMENT Goal: RH STG PAIN MANAGED AT OR BELOW PT'S PAIN GOAL Description: < 4 with prns Outcome: Progressing   Problem: RH KNOWLEDGE DEFICIT Goal: RH STG INCREASE KNOWLEDGE OF HYPERTENSION Description: Patient and S.O. will be able to manage HTN using educational resources for medications and dietary modifications independently Outcome: Progressing Goal: RH STG INCREASE KNOWLEGDE OF HYPERLIPIDEMIA Description: Patient and S.O. will be able to manage HLD using educational resources for medications and dietary modifications independently Outcome: Progressing Goal: RH STG INCREASE KNOWLEDGE OF STROKE PROPHYLAXIS Description: Patient and S.O. will be able to manage secondary risks using educational resources for medications and dietary modifications independently Outcome: Progressing

## 2023-05-15 NOTE — Progress Notes (Signed)
Speech Language Pathology Daily Session Note  Patient Details  Name: Vernetta Dizdarevic MRN: [id number] Date of Birth: [birth year]  Today's Date: 05/15/2023 SLP Individual Time: 1503-1530 SLP Individual Time Calculation (min): 27 min  Short Term Goals: Week 2: SLP Short Term Goal 1 (Week 2): STG = LTG due to ELOS  Skilled Therapeutic Interventions: Direct handoff from PT. Skilled therapy session focused on family education. SLP educated family (boyfriend, son, daughter in law) on patients current cognitive level of functioning and strategies taught in therapy. SLP reviewed importance of utilizing organizational device for medications and family in agreement. SLP reviewed MBS results and provided brief overview of oropharyngeal swallow anatomy and physiology. Discussed regular/thin diet recommendations with potential need to f/u with GI. Lastly, SLP educated family on EMST (expiratory muscle strengthening device) and its purpose. SLP demonstrated how to utilize device and change settings. Patient and family verbalized understanding with no further questions. Patient left in bed with alarm set and call bell in reach. Continue POC.    Pain None reported   Therapy/Group: Individual Therapy  Aundrea Higginbotham M.A., CF-SLP 05/15/2023, 9:12 AM

## 2023-05-15 NOTE — Progress Notes (Signed)
Discussed patient with floor nurse and rapid nurse. Patient with generalized seizure type activity per significant other and witnessed by nurse lasting total of 5 minutes.  She was back to baseline, vitals/BS stable. Evaluated by Rapid who reported patient back to baseline -not ictal, confused or lethargic.  She had another episode a few minutes later, this time with head turn to the right which lasted about 2 minutes and patient reported to be resting.   Discussed patient with Dr, Amada Jupiter. He reported patient had recent work up with non epileptic episodes on EEG and question psychogenic. No need for repeat study or EEG. No need to increase dose of Keppra. Did discuss that Abilify can decrease seizure threshold therefore dose decreased down to 5 mg. Use ativan 0.5 mg prn for episode lasting > 5 minutes. Nurse updated

## 2023-05-15 NOTE — Progress Notes (Signed)
Witnessed patient having a seizure around 2215. Patients boyfriend activated the emergency response button and this nurse went to see patient. Upon entering the room saw patients all four limbs shaking with head turned to right side. Seizure lasted about 5 min Rapid response was notified as well as primary nurse.

## 2023-05-15 NOTE — Patient Care Conference (Signed)
Inpatient RehabilitationTeam Conference and Plan of Care Update Date: 05/15/2023   Time: 10:06 AM    Patient Name: Brittany Mullins [facility]      Medical Record Number: [id number]  Date of Birth: 10-03-1980 Sex: Female         Room/Bed: 4M12C/4M12C-01 Payor Info: Payor: MEDICARE / Plan: MEDICARE PART A AND B / Product Type: *No Product type* /    Admit Date/Time:  05/01/2023  5:12 PM  Primary Diagnosis:  Left pontine stroke [facility])  Hospital Problems: Principal Problem:   Left pontine stroke [facility]) Active Problems:   Chronic pain disorder   Bipolar I disorder with mixed features [facility])   History of bipolar disorder    Expected Discharge Date: Expected Discharge Date: 05/17/23  Team Members Present: Physician leading conference: Dr. Claudette Laws Social Worker Present: Cecile Sheerer, LCSWA Nurse Present: Chana Bode, RN PT Present: Wynelle Link, PT;Dominic Albertina [place], PTA OT Present: Primitivo Gauze, OT SLP Present: Everardo Pacific, SLP PPS Coordinator present : Fae Pippin, SLP     Current Status/Progress Goal Weekly Team Focus  Bowel/Bladder   Patient is continent of bowel and bladder. Last BM 12/11.   Patient will remain continent.   Prevnet incontinence.    Swallow/Nutrition/ Hydration               ADL's   set up only with distant S with ADLs and transfers   goals upgraded to Mod ind with self care and light IADLS   ADL training, balance, UE strength, pt/fam education    Mobility   Bed mobility (modI); Transfers = supervision with rollator; Stairs = supervision (x24 steps and one handrail).   modI/supervision  Barriers = tightness in B posterior LE musculature decreasing upright postioning during dynamic standing/mobility (R>L); Focus = Family education, gait endurance, stairs, B LE therex/nmre    Communication   occasional reduced intelligibility as a result of breath support ~95% intelligible in conversation   supervisionA   use of EMST, SLOP  speech strategeis    Safety/Cognition/ Behavioral Observations  mildly complex prob solving, attention, memory   supervisionA   functional problem solving, STM    Pain   Patient requires occasional PRNS for pain.   Pain <5.   Treat any pain that may arise.    Skin   Patient skin is clean, dry, and intact.   Maintain skin integrity.  Keep skin intact.      Discharge Planning:  Pt will d/c to home with support with s/o [name]. He will work on hiring care for pt while he is at work. Fam edu completed. SW will ordered TTB, shower chair with back- will confirm final d/c recs. SW will send PCS referral. SW will confirm there are no barriers to discharge.   Team Discussion: Patient post left pontine CVA with decreased pain in shoulder and able to use it more. Continue to note flexed posture and balance deficits with burning sensation in lower extremity.  No pseudo-seizure activity noted; Abilify adjusted and Lamictal monitoring per MD.   Patient on target to meet rehab goals: yes, currently working on complex problem solving deficits and non pharmacological options for pain management.   *See Care Plan and progress notes for long and short-term goals.   Revisions to Treatment Plan:  Sensory grounding technique education Psychiatry referral E-stim therapy RMST/breath support training   Teaching Needs: Safety, medications, transfers, toileting, etc.   Current Barriers to Discharge: Decreased caregiver support 3rd floor apartment Possible Resolutions to Barriers: Family education  Letter provided for recommendation of ground level apartment with ADA options HH follow up services PCS referral DME: BSC, rollator, TTB     Medical Summary Current Status: pseudoseizure improved with sensory grounding techniques  Barriers to Discharge: Behavior/Mood  Barriers to Discharge Comments: adjusting psych meds         I attest that I was present, lead the team conference, and  concur with the assessment and plan of the team.   Chana Bode B 05/15/2023, 2:13 PM

## 2023-05-15 NOTE — Progress Notes (Signed)
Physical Therapy Session Note  Patient Details  Name: Brittany Mullins MRN: [id number] Date of Birth: [birth year]  Today's Date: 05/15/2023 PT Individual Time: 1914-7829 PT Individual Time Calculation (min): 55 min   Short Term Goals: Week 2:  PT Short Term Goal 1 (Week 2): = LTGs  Skilled Therapeutic Interventions/Progress Updates: Patient sitting EOB with family present on entrance to room. Patient alert and agreeable to PT session.   Patient with no complaints of pain this session. Therapy focused on family education with updates on pt's progress and main concerns (Pt's son main concern being ability for pt to transfer to [facility]).   Pt's daughter in low has TENs unit at home and can provide to pt to see if it will benefit for pain management of B UE's. Pt family also educated on STM of B gastrocnemius to lengthen musculature for increased ROM and improved functional mobility.   Therapeutic Activity: Transfers: Pt performed sit<>stand transfers throughout session with rollator and modI.   Car Transfer: Pt performed with car simulated to slightly higher than reported height by pt's son to ensure pt ability to transfer into car. Pt with supervision throughout and VC to perform pivot into car with hips on seat vs first attempt where pt use grab bar (pt's son has one in car) and lead with L LE advancing into car first (pt educated on safety measures of performing pivot into car).   Stair Navigation: Pt ascended/descended 2 flights of stairs (22 total with 7" step height) with R hand rail use and supervision throughout. Pt required seated rest break at next floor prior to descending. Pt recalled sequence (leading with L ascending, and R descending).   ADL Apartment: Pt requested to simulate shifting weight forward to [employer]. Pt required VC to step to side of dishwasher with hand placement on counter top. Pt performed short simulation with supervision. Pt also reached  to high cabinet to grab plate to see if able to due so with limited ROM in RUE.   Gait Training:  Pt ambulated throughout session from room<ortho gym<main gym stairwell<ADL apartment<main gym<room (rest breaks prn throughout) using rollator with supervision. Pt with good carryover with B heel-strike. Pt still with B knee flexion (R>L) and hip flexion due to tight HS and gastrocnemius (preventing adequate step clearance).   Neuromuscular Re-ed: NMR facilitated during session with focus on dynamic standing balance, proprioceptive feedback and weight shifting. - Pt navigated 6" hurdles with CGA (no UE support), then progressed to close supervision. Pt cued to lead with R LE with education and demonstration provided due to decreased dorsiflexion and knee extension ROM on R (pt informed to carry this knowledge over to overall safety until ROM increases to decrease risk of falls).  - Toe taps to orange cone alternating B LE's with CGA/close supervision. Progressed to having object placed on top with cues to avoid knocking it off cone. Pt performed until needing to take seated rest break. Pt also required VC to maintain safe proximity to cone, and to adjust accordingly.   NMR performed for improvements in motor control and coordination, balance, sequencing, judgement, and self confidence/ efficacy in performing all aspects of mobility at highest level of independence.   Patient sitting EOB at end of session with brakes locked, SLP hand off, family present and all needs within reach.      Therapy Documentation Precautions:  Precautions Precautions: Fall Restrictions Weight Bearing Restrictions Per Provider Order: No  Therapy/Group: Individual Therapy  Coreyon Nicotra PTA  05/15/2023, 3:38 PM

## 2023-05-15 NOTE — Progress Notes (Signed)
Dr. Sandria Manly was notified that pt had a seizure. Ct of head was ordered.

## 2023-05-16 ENCOUNTER — Inpatient Hospital Stay (HOSPITAL_COMMUNITY): Payer: Medicare Other

## 2023-05-16 ENCOUNTER — Other Ambulatory Visit (HOSPITAL_COMMUNITY): Payer: Self-pay

## 2023-05-16 DIAGNOSIS — F1290 Cannabis use, unspecified, uncomplicated: Secondary | ICD-10-CM | POA: Diagnosis not present

## 2023-05-16 DIAGNOSIS — F1129 Opioid dependence with unspecified opioid-induced disorder: Secondary | ICD-10-CM | POA: Diagnosis not present

## 2023-05-16 DIAGNOSIS — M25512 Pain in left shoulder: Secondary | ICD-10-CM | POA: Diagnosis not present

## 2023-05-16 DIAGNOSIS — G5621 Lesion of ulnar nerve, right upper limb: Secondary | ICD-10-CM | POA: Diagnosis not present

## 2023-05-16 DIAGNOSIS — I639 Cerebral infarction, unspecified: Secondary | ICD-10-CM | POA: Diagnosis not present

## 2023-05-16 DIAGNOSIS — G8929 Other chronic pain: Secondary | ICD-10-CM | POA: Diagnosis not present

## 2023-05-16 LAB — BASIC METABOLIC PANEL
Anion gap: 11 (ref 5–15)
BUN: 18 mg/dL (ref 6–20)
CO2: 26 mmol/L (ref 22–32)
Calcium: 8.8 mg/dL — ABNORMAL LOW (ref 8.9–10.3)
Chloride: 99 mmol/L (ref 98–111)
Creatinine, Ser: 0.77 mg/dL (ref 0.44–1.00)
GFR, Estimated: >60 mL/min (ref >60)
Glucose, Bld: 60 mg/dL — ABNORMAL LOW (ref 70–99)
Potassium: 3.8 mmol/L (ref 3.5–5.1)
Sodium: 136 mmol/L (ref 135–145)

## 2023-05-16 MED ORDER — CAPSAICIN 0.075 % EX CREA
TOPICAL_CREAM | Freq: Two times a day (BID) | CUTANEOUS | 0 refills | Status: AC
Start: 2023-05-16 — End: ?
  Filled 2023-05-16: qty 28.3, fill #0

## 2023-05-16 MED ORDER — LEVETIRACETAM 500 MG PO TABS
500.0000 mg | ORAL_TABLET | Freq: Three times a day (TID) | ORAL | 0 refills | Status: DC
  Filled 2023-05-16 (×2): qty 90, 30d supply, fill #0

## 2023-05-16 MED ORDER — HYDROCORTISONE 5 MG PO TABS: ORAL_TABLET | ORAL | Status: DC

## 2023-05-16 MED ORDER — ARIPIPRAZOLE 5 MG PO TABS
5.0000 mg | ORAL_TABLET | Freq: Every day | ORAL | 0 refills | Status: DC
  Filled 2023-05-16: qty 30, 30d supply, fill #0

## 2023-05-16 MED ORDER — CYCLOBENZAPRINE HCL 5 MG PO TABS
5.0000 mg | ORAL_TABLET | Freq: Three times a day (TID) | ORAL | 0 refills | Status: AC | PRN
  Filled 2023-05-16: qty 30, 10d supply, fill #0

## 2023-05-16 MED ORDER — LAMOTRIGINE 25 MG PO TABS
25.0000 mg | ORAL_TABLET | Freq: Two times a day (BID) | ORAL | 0 refills | Status: DC
  Filled 2023-05-16: qty 60, 30d supply, fill #0

## 2023-05-16 MED ORDER — LORAZEPAM 2 MG/ML IJ SOLN
1.0000 mg | Freq: Once | INTRAMUSCULAR | Status: AC | PRN
  Administered 2023-05-16: 1 mg via INTRAVENOUS
  Filled 2023-05-16: qty 1

## 2023-05-16 MED ORDER — ACETAMINOPHEN 325 MG PO TABS
650.0000 mg | ORAL_TABLET | Freq: Four times a day (QID) | ORAL | 0 refills | Status: AC | PRN
  Filled 2023-05-16: qty 20, 3d supply, fill #0

## 2023-05-16 MED ORDER — HYDROXYZINE HCL 10 MG PO TABS
10.0000 mg | ORAL_TABLET | Freq: Three times a day (TID) | ORAL | 0 refills | Status: AC | PRN
  Filled 2023-05-16: qty 30, 10d supply, fill #0

## 2023-05-16 MED ORDER — ASPIRIN 81 MG PO TBEC
81.0000 mg | DELAYED_RELEASE_TABLET | Freq: Every day | ORAL | 0 refills | Status: AC
  Filled 2023-05-16: qty 100, 100d supply, fill #0

## 2023-05-16 MED ORDER — POLYVINYL ALCOHOL 1.4 % OP SOLN
1.0000 [drp] | Freq: Two times a day (BID) | OPHTHALMIC | Status: DC
  Administered 2023-05-16 – 2023-05-17 (×4): 1 [drp] via OPHTHALMIC
  Filled 2023-05-16: qty 15

## 2023-05-16 MED ORDER — PANTOPRAZOLE SODIUM 40 MG PO TBEC
40.0000 mg | DELAYED_RELEASE_TABLET | Freq: Every day | ORAL | 0 refills | Status: DC
  Filled 2023-05-16: qty 30, 30d supply, fill #0

## 2023-05-16 MED ORDER — ATORVASTATIN CALCIUM 40 MG PO TABS
40.0000 mg | ORAL_TABLET | Freq: Every day | ORAL | 0 refills | Status: AC
  Filled 2023-05-16: qty 30, 30d supply, fill #0

## 2023-05-16 MED ORDER — LORAZEPAM 2 MG/ML IJ SOLN: 1.0000 mg | Freq: Once | INTRAMUSCULAR | Status: DC

## 2023-05-16 MED ORDER — LOSARTAN POTASSIUM 25 MG PO TABS
25.0000 mg | ORAL_TABLET | Freq: Every day | ORAL | 0 refills | Status: DC
  Filled 2023-05-16: qty 30, 30d supply, fill #0

## 2023-05-16 MED ORDER — HYDROCORTISONE 5 MG PO TABS
ORAL_TABLET | ORAL | 0 refills | Status: DC
Start: 2023-05-16 — End: 2023-05-22
  Filled 2023-05-16: qty 45, 15d supply, fill #0

## 2023-05-16 MED ORDER — LORAZEPAM 2 MG/ML IJ SOLN
INTRAMUSCULAR | Status: AC
  Filled 2023-05-16: qty 1

## 2023-05-16 MED ORDER — GABAPENTIN 300 MG PO CAPS
300.0000 mg | ORAL_CAPSULE | Freq: Two times a day (BID) | ORAL | 0 refills | Status: AC
Start: 2023-05-16 — End: ?
  Filled 2023-05-16: qty 60, 30d supply, fill #0

## 2023-05-16 MED ORDER — SENNOSIDES-DOCUSATE SODIUM 8.6-50 MG PO TABS
2.0000 | ORAL_TABLET | Freq: Every day | ORAL | 0 refills | Status: DC
  Filled 2023-05-16: qty 60, 30d supply, fill #0

## 2023-05-16 MED ORDER — AMLODIPINE BESYLATE 5 MG PO TABS
5.0000 mg | ORAL_TABLET | Freq: Every day | ORAL | 0 refills | Status: DC
  Filled 2023-05-16: qty 30, 30d supply, fill #0

## 2023-05-16 MED ORDER — GABAPENTIN 400 MG PO CAPS
400.0000 mg | ORAL_CAPSULE | Freq: Every day | ORAL | 0 refills | Status: AC
  Filled 2023-05-16: qty 30, 30d supply, fill #0

## 2023-05-16 MED ORDER — POLYVINYL ALCOHOL 1.4 % OP SOLN
1.0000 [drp] | Freq: Two times a day (BID) | OPHTHALMIC | 0 refills | Status: DC
  Filled 2023-05-16: qty 15, 150d supply, fill #0

## 2023-05-16 NOTE — Progress Notes (Signed)
Physical Therapy Discharge Summary  Patient Details  Name: Laneisha Mino MRN: [id number] Date of Birth: 1980-10-25  Date of Discharge from PT service:May 16, 2023  Patient has met 8 of 8 long term goals due to improved activity tolerance, improved balance, improved postural control, increased strength, increased range of motion, decreased pain, improved awareness, and improved coordination.  Patient to discharge at an ambulatory level Supervision with rollator.   Patient's care partner is independent to provide the necessary physical assistance at discharge.  Recommendation:  Patient will benefit from ongoing skilled PT services in home health setting to continue to advance safe functional mobility, address ongoing impairments in strength, coordination, balance, activity tolerance, cognition, safety awareness and to minimize fall risk.  Equipment: Rollator  Reasons for discharge: treatment goals met  Patient/family agrees with progress made and goals achieved: Yes  PT Discharge Precautions/Restrictions Precautions Precautions: Fall Restrictions Weight Bearing Restrictions Per Provider Order: No Pain Pain Assessment Pain Scale: 0-10 Pain Score: 5  (5-6/10 HA) Pain Type: Acute pain Pain Location: Leg Pain Orientation: Right Pain Descriptors / Indicators: Aching;Spasm Pain Frequency: Intermittent Pain Onset: With Activity Pain Intervention(s): Medication (See eMAR) Pain Interference Pain Interference Pain Effect on Sleep: 3. Frequently Pain Interference with Therapy Activities: 2. Occasionally Pain Interference with Day-to-Day Activities: 2. Occasionally Vision/Perception  Vision - History Ability to See in Adequate Light: 1 Impaired Vision - Assessment Tracking/Visual Pursuits: Able to track stimulus in all quads without difficulty Perception Perception: Within Functional Limits Praxis Praxis: WFL  Cognition Overall Cognitive Status: Within  Functional Limits for tasks assessed Arousal/Alertness: Awake/alert Orientation Level: Oriented X4 Sustained Attention: Appears intact Memory: Appears intact Awareness: Appears intact Problem Solving: Appears intact Safety/Judgment: Appears intact Sensation Sensation Light Touch: Appears Intact (Simultaneous filing. User may not have seen previous data.) Hot/Cold: Appears Intact (Simultaneous filing. User may not have seen previous data.) Proprioception: Not tested Proprioception Impaired Details: Impaired RLE;Impaired RUE Stereognosis: Appears Intact (functionally pt does not have sensation limitations during ADLs  Simultaneous filing. User may not have seen previous data.) Additional Comments: reports numbness in RLE and RUE Coordination Gross Motor Movements are Fluid and Coordinated: No (Simultaneous filing. User may not have seen previous data.) Fine Motor Movements are Fluid and Coordinated: Yes (Simultaneous filing. User may not have seen previous data.) Motor  Motor Motor: Other (comment) (Hemiparesis) Motor - Discharge Observations: Pt has improved in ataxic/ motor impersistence presentation since evaluation. Pt has increased safe and functional awareness for tasks participated in. R UE/LE improvement of strength.  Mobility Bed Mobility Bed Mobility: Rolling Right;Sit to Supine;Supine to Sit Rolling Right: Independent Supine to Sit: Independent Sit to Supine: Independent Transfers Transfers: Sit to Stand;Stand to Sit;Stand Pivot Transfers Sit to Stand: Independent with assistive device Stand to Sit: Independent with assistive device Stand Pivot Transfers: Independent with assistive device Transfer (Assistive device): Rollator Locomotion  Gait Ambulation: Yes Gait Distance (Feet): 300 Feet Assistive device: Rollator Gait Gait: Yes Gait Pattern: Impaired Gait Pattern: Poor foot clearance - right;Poor foot clearance - left;Left flexed knee in stance;Right flexed knee  in stance;Decreased dorsiflexion - right;Decreased dorsiflexion - left Gait velocity: Decreased but has improved since evaluation Stairs / Additional Locomotion Stairs: Yes Stairs Assistance: Supervision/Verbal cueing Stair Management Technique: One rail Right Number of Stairs: 22 Height of Stairs: 7 Wheelchair Mobility Wheelchair Mobility: No  Trunk/Postural Assessment  Cervical Assessment Cervical Assessment: Within Functional Limits Thoracic Assessment Thoracic Assessment: Exceptions to [facility] (Forward flexed) Lumbar Assessment Lumbar Assessment: Exceptions to [facility] (posterior pelvic tilt)  Postural Control Postural Control: Deficits on evaluation Trunk Control: decreased in standing Protective Responses: delayed but has increased since evaluation  Balance Balance Balance Assessed: Yes Standardized Balance Assessment Standardized Balance Assessment: Timed Up and Go Test Timed Up and Go Test TUG: Normal TUG Normal TUG (seconds): 16.67 Static Sitting Balance Static Sitting - Level of Assistance: 7: Independent Dynamic Sitting Balance Dynamic Sitting - Level of Assistance: 7: Independent Static Standing Balance Static Standing - Level of Assistance: 6: Modified independent (Device/Increase time) Dynamic Standing Balance Dynamic Standing - Level of Assistance: 6: Modified independent (Device/Increase time) Extremity Assessment  RUE Assessment RUE Assessment: Within Functional Limits LUE Assessment LUE Assessment: Within Functional Limits Passive Range of Motion (PROM) Comments: WFL, no longer has shoulder pain Active Range of Motion (AROM) Comments: WFL RLE Assessment RLE Assessment: Within Functional Limits LLE Assessment LLE Assessment: Within Functional Limits   Dominic Sandoval PTA  05/16/2023, 1:09 PM Philip Aspen, PT, DPT, CBIS

## 2023-05-16 NOTE — Plan of Care (Signed)
  Problem: RH Memory Goal: LTG Patient will use memory compensatory aids to (SLP) Description: LTG:  Patient will use memory compensatory aids to recall biographical/new, daily complex information with cues (SLP) Outcome: Not Met (cont to require occasional minA)   Problem: RH Swallowing Goal: LTG Patient will consume least restrictive diet using compensatory strategies with assistance (SLP) Description: LTG:  Patient will consume least restrictive diet using compensatory strategies with assistance (SLP) Outcome: Completed/Met   Problem: RH Expression Communication Goal: LTG Patient will increase speech intelligibility (SLP) Description: LTG: Patient will increase speech intelligibility at word/phrase/conversation level with cues, % of the time (SLP) Outcome: Completed/Met   Problem: RH Problem Solving Goal: LTG Patient will demonstrate problem solving for (SLP) Description: LTG:  Patient will demonstrate problem solving for basic/complex daily situations with cues  (SLP) Outcome: Completed/Met   Problem: RH Attention Goal: LTG Patient will demonstrate this level of attention during functional activites (SLP) Description: LTG:  Patient will will demonstrate this level of attention during functional activites (SLP) Outcome: Completed/Met

## 2023-05-16 NOTE — Progress Notes (Signed)
Patient unable to tolerate MRI despite dose of ativan.  PA contacted and order changed to CT scan.  Returned to room. VSS.

## 2023-05-16 NOTE — Group Note (Signed)
Patient Details Name: Brittany Mullins MRN: [id number] DOB: 1980-09-04 Today's Date: 05/16/2023  Time Calculation: OT Group Time Calculation OT Group Start Time: 1430 OT Group Stop Time: 1530 OT Group Time Calculation (min): 60 min      Group Description: Dance Group: Pt participated in dance group with an emphasis on social interaction, motor planning, increasing overall activity tolerance and bimanual tasks. All songs were selected by group members. Dance moves included AROM of BUE/BLE gross motor movements with an emphasis on building functional endurance.    Individual level documentation: Patient completed group from sitting level. Patientt needed supervision to complete various dance moves with OT providing visual model.  Patient able to create her own modifications during group.  Pain: 0/10  Precautions:  Falls   Clide Deutscher 05/16/2023, 4:05 PM

## 2023-05-16 NOTE — Progress Notes (Addendum)
PROGRESS NOTE   Subjective/Complaints:  Had another spell last noc, not witnessed by staff, boyfriend was in room  No post ictal state, no incontinence   ROS: Patient denies nausea, vomiting, diarrhea, cough, shortness of breath or chest pain,       Objective:   No results found.  No results for input(s): "WBC", "HGB", "HCT", "PLT" in the last 72 hours.   No results for input(s): "NA", "K", "CL", "CO2", "GLUCOSE", "BUN", "CREATININE", "CALCIUM" in the last 72 hours.    Intake/Output Summary (Last 24 hours) at 05/16/2023 0800 Last data filed at 05/15/2023 1900 Gross per 24 hour  Intake 717 ml  Output --  Net 717 ml        Physical Exam: Vital Signs Blood pressure 111/69, pulse 75, temperature 98.4 F (36.9 C), temperature source Oral, resp. rate 17, height 5\' 2"  (1.575 m), weight 70.3 kg, SpO2 95%.   General: No acute distress Mood and affect are appropriate Heart: Regular rate and rhythm no rubs murmurs or extra sounds Lungs: Clear to auscultation, breathing unlabored, no rales or wheezes Abdomen: Positive bowel sounds, soft nontender to palpation, nondistended Extremities: No clubbing, cyanosis, or edema  Skin: No evidence of breakdown, no evidence of rash Neurologic: Cranial nerves II through XII intact, motor strength is 4- /5 in right deltoid, bicep, tricep,RIght  grip, 5/5 Left grip, 5/5 left and 4/5 right hip flexor, knee extensors, ankle dorsiflexor and plantar flexor Some pain inhibition LUE  Sensory exam hypersensitivity LLE thigh to foot, no joint swelling or ROM restriction , neg SLR,   Musculoskeletal: Full range of motion in all 4 extremities. No joint swelling. Myofascial pain of shoulder girdle improved    Assessment/Plan: 1. Functional deficits which require 3+ hours per day of interdisciplinary therapy in a comprehensive inpatient rehab setting. Physiatrist is providing close team  supervision and 24 hour management of active medical problems listed below. Physiatrist and rehab team continue to assess barriers to discharge/monitor patient progress toward functional and medical goals  Care Tool:  Bathing    Body parts bathed by patient: Abdomen, Chest, Front perineal area, Right upper leg, Left upper leg, Left lower leg, Face, Left arm, Right arm, Buttocks, Right lower leg   Body parts bathed by helper: Right lower leg, Buttocks, Right arm, Left arm     Bathing assist Assist Level: Supervision/Verbal cueing     Upper Body Dressing/Undressing Upper body dressing   What is the patient wearing?: Bra, Pull over shirt    Upper body assist Assist Level: Independent with assistive device    Lower Body Dressing/Undressing Lower body dressing      What is the patient wearing?: Underwear/pull up, Pants     Lower body assist Assist for lower body dressing: Supervision/Verbal cueing     Toileting Toileting    Toileting assist Assist for toileting: Supervision/Verbal cueing     Transfers Chair/bed transfer  Transfers assist     Chair/bed transfer assist level: Contact Guard/Touching assist     Locomotion Ambulation   Ambulation assist      Assist level: Supervision/Verbal cueing Assistive device: Walker-rolling Max distance: 200   Walk 10 feet activity  Assist     Assist level: Supervision/Verbal cueing Assistive device: Walker-rolling   Walk 50 feet activity   Assist Walk 50 feet with 2 turns activity did not occur: Safety/medical concerns  Assist level: Supervision/Verbal cueing Assistive device: Walker-rolling    Walk 150 feet activity   Assist Walk 150 feet activity did not occur: Safety/medical concerns  Assist level: Supervision/Verbal cueing Assistive device: Walker-rolling    Walk 10 feet on uneven surface  activity   Assist Walk 10 feet on uneven surfaces activity did not occur: Safety/medical  concerns   Assist level: Supervision/Verbal cueing Assistive device: Walker-rolling   Wheelchair     Assist Is the patient using a wheelchair?: Yes Type of Wheelchair: Manual    Wheelchair assist level: Supervision/Verbal cueing Max wheelchair distance: 100    Wheelchair 50 feet with 2 turns activity    Assist        Assist Level: Supervision/Verbal cueing   Wheelchair 150 feet activity     Assist      Assist Level: Maximal Assistance - Patient 25 - 49%   Blood pressure 111/69, pulse 75, temperature 98.4 F (36.9 C), temperature source Oral, resp. rate 17, height 5\' 2"  (1.575 m), weight 70.3 kg, SpO2 95%.  Medical Problem List and Plan: 1. Functional deficits secondary to Lft ventral pontine CVA, likely small vessel disease             -patient may shower             -ELOS/Goals: 2/14   Mod I to Sup PT/OT/SLP         -Continue CIR therapies including PT, OT, and SLP  Neuro exam stable/improving state no new neuro deficits  2.  Antithrombotics: -DVT/anticoagulation:  Pharmaceutical: Lovenox             -antiplatelet therapy: DAPT X 3 weeks followed by ASA alone.  3. Pain Management: On Methadone (followed by [facility]).             --now on prn oxycodone due to shoulder/arm pain reduced to 1 tab oxy IR 5, pt asking for 2 tabs, discussed concerns for sedation ( also hx OUD)             --gabapentin TID for neuropathy, increase qh dose  Slept ok but more burning RLE this am  Change gabapentin to 300mg  BID and 400mg  at bedtime  4. H/o bipolar d/o/Mood/Behavior/Sleep: LCSW to follow for evaluation and support.              -2/3 psych recommended abilify 7.5mg  q am and lamictal 25mg  bid   -2/12- per psych can titrate Lamictal q 2wks, so too early , can titrate abilify, increased to 10mg  today    Asking for anxiety med states the Ativan IV calmed her down, discussed that ativan does not interact well with methadone,  May use hydroxyzine  Reinforce self  management techniques              - 5. Neuropsych/cognition: This patient is capable of making decisions on her own behalf. 6. Skin/Wound Care: Routine pressure relief measures. 7. Fluids/Electrolytes/Nutrition: Monitor I/O.    2/10 potassium 4.0, continue daily supplement      Latest Ref Rng & Units 05/13/2023    5:32 AM 05/09/2023    5:48 AM 05/06/2023    5:18 AM  BMP  Glucose 70 - 99 mg/dL 81  71  74   BUN 6 - 20 mg/dL 20  18  22  Creatinine 0.44 - 1.00 mg/dL 1.61  0.96  0.45   Sodium 135 - 145 mmol/L 140  137  139   Potassium 3.5 - 5.1 mmol/L 4.0  4.0  4.0   Chloride 98 - 111 mmol/L 99  99  105   CO2 22 - 32 mmol/L 26  27  27    Calcium 8.9 - 10.3 mg/dL 9.4  8.9  8.8     8. HTN: Monitor BP TID--continue Cozaar and Norvasc.    Bp's currently tolerable 9. 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 Addison's disease: Now on Cortef 10 mg am/5 mg pm 11. Chronic pancreatitis: Has chronic issues with nausea --continue phenergan --continue PPI Vomiting yesterday abd exam normal  BM this am  Possibility of new meds causing nausea  13. Constipation: Reports rectal bleeding-->Mullins added to senna due to hx of hems.   14. Left arm shoulder/elbow pain             -Xray elbow/shoulder 1/28 negative Suspect myofascial pain +/- frozen shoulder - improved shoulder ROM, still with upper trap trigger point -   2/3 pt received trigger point injection yesterday. ?improvement--was focused on other issues today Has pec and trap soreness with increased walker use , MSK chest pain trat symptomatically  15. Marijuana use             -Advise cessation, associated with increased stroke risk 26-44% depending on study   -2/3 uds + yesterday for thc, benzos. Urine drug profile sent to [facility], pending  -f/u UDS c/w methadone use, THC 107 16. Hx of pituitary tumor             -f/u Neurosurgery 17. Low protein S             -f/u  Hematology outpatient, will need retesting in ~49mo  18.  Hx sensorineural hearing loss with resultant dysarthria  19.  LUE numbness C5 distribution shoulder pain , ? Proximal weakness vs pain inhibition , only mild C4-5 stenosis on MRI  , MRI reviewed with Neuroradiology , no spinal cord signal abnormalities Neurosurg, Dr Dawley looked at MRI , C spine looks stable  Still has subjective numbess in left shoulder   20.  Insomnia- likely multifactorial, anxiety regarding medical condition, underlying anxiety d/o and on higher dose steroid taper - expect some improvement with steroid taper ,  - hydroxyzine for anxiety  -sleep disturbed by neurogenic pain  21.  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

## 2023-05-16 NOTE — Progress Notes (Addendum)
Occupational Therapy Discharge Summary  Patient Details  Name: Brittany Mullins MRN: [id number] Date of Birth: 1980/04/10  Date of Discharge from OT service:May 16, 2023  Today's Date: 05/16/2023 OT Individual Time: 1000-1045 OT Individual Time Calculation (min): 45 min   Pt seen this session with [name], [name], present for continued education. Pt was able to move about her room in a safe manner with 4WW.  Told pt she is now Mod Ind in her room but to call for assist if she does not feel well.  Spent time this session going over more safety with IADLs of homemaking and cooking. Pt ambulated to kitchen and practiced retrieving items from low and high cabinets, stove, dishwasher, fridge and she practiced with cues on using a wide stance and one point of hand contact to give her even more stability when reaching low or out of base of support. Discussed organizing new kitchen in new apt with most frequently used items within easy reach.   Pt discussed her moving plans when she leaves tomorrow. Pt has met all goals and is ready for discharge. New onset of L eye blurriness with visual field cut post psuedo seizure last night.  Discussed using head turns to left as a compensation strategy and to see an ophthalmologist if vision does not improve in the next few weeks.    Patient has met 15 of 15 long term goals due to improved activity tolerance, improved balance, postural control, ability to compensate for deficits, functional use of  RIGHT upper, RIGHT lower, LEFT upper, and LEFT lower extremity, and improved coordination.  Patient to discharge at overall Modified Independent level.  Patient's care partner is independent to provide the necessary physical and cognitive assistance at discharge.  Pt's significant other and her family have participated in family education.  Reasons goals not met: n/a  Recommendation:  Patient will benefit from ongoing skilled OT services in home health  setting to continue to advance functional skills in the area of iADL.  Equipment: Emergency planning/management officer, BSC  Reasons for discharge: treatment goals met  Patient/family agrees with progress made and goals achieved: Yes  OT Discharge Precautions/Restrictions  Precautions Precautions: Fall Restrictions Weight Bearing Restrictions Per Provider Order: No Pain Pain Assessment Pain Scale: 0-10 Pain Score: 5  (5-6/10 HA) Pain Type: Acute pain Pain Location: Leg Pain Orientation: Right Pain Descriptors / Indicators: Aching;Spasm Pain Frequency: Intermittent Pain Onset: With Activity Pain Intervention(s): Medication (See eMAR) ADL ADL Eating: Independent Grooming: Independent Where Assessed-Grooming: Standing at sink Upper Body Bathing: Modified independent Where Assessed-Upper Body Bathing: Shower Lower Body Bathing: Modified independent Where Assessed-Lower Body Bathing: Shower Upper Body Dressing: Independent Where Assessed-Upper Body Dressing: Edge of bed Lower Body Dressing: Modified independent Where Assessed-Lower Body Dressing: Edge of bed Toileting: Modified independent Where Assessed-Toileting: Teacher, adult education: Engineer, agricultural Method: Proofreader: Engineer, technical sales: Modified independent Web designer Method: Ship broker: Emergency planning/management officer, Acupuncturist: Modified independent Film/video editor Method: Designer, industrial/product: Emergency planning/management officer, Grab bars Vision Baseline Vision/History: 1 Wears glasses Patient Visual Report: Blurring of vision (Reported L eye change in past 24 hours) Tracking/Visual Pursuits: Able to track stimulus in all quads without difficulty Visual Fields: Left visual field deficit (L visual field cut in L eye only to 45 degrees (new onset as of 05/16/23)  MD aware) Perception  Perception: Within Functional  Limits Praxis Praxis: WFL Cognition Cognition Overall Cognitive Status: Within  Functional Limits for tasks assessed Arousal/Alertness: Awake/alert Memory: Appears intact Sustained Attention: Appears intact Awareness: Appears intact Problem Solving: Appears intact Safety/Judgment: Appears intact Brief Interview for Mental Status (BIMS) Repetition of Three Words (First Attempt): 3 Temporal Orientation: Year: Correct Temporal Orientation: Month: Accurate within 5 days Temporal Orientation: Day: Correct Recall: "Sock": Yes, no cue required Recall: "Blue": Yes, no cue required Recall: "Bed": Yes, no cue required BIMS Summary Score: 15 Sensation Sensation Light Touch: Appears Intact (Simultaneous filing. User may not have seen previous data.) Hot/Cold: Appears Intact (Simultaneous filing. User may not have seen previous data.) Proprioception: Not tested Proprioception Impaired Details: Impaired RLE;Impaired RUE Stereognosis: Appears Intact (functionally pt does not have sensation limitations during ADLs  Simultaneous filing. User may not have seen previous data.) Additional Comments: reports numbness in RLE and RUE Coordination Gross Motor Movements are Fluid and Coordinated: No (Simultaneous filing. User may not have seen previous data.) Fine Motor Movements are Fluid and Coordinated: Yes (Simultaneous filing. User may not have seen previous data.) Motor  Motor Motor: Other (comment) (Hemiparesis) Motor - Discharge Observations: Pt has improved in ataxic/ motor impersistence presentation since evaluation. Pt has increased safe and functional awareness for tasks participated in. R UE/LE improvement of strength. Mobility  Bed Mobility Bed Mobility: Rolling Right;Sit to Supine;Supine to Sit Rolling Right: Independent Supine to Sit: Independent Sit to Supine: Independent Transfers Sit to Stand: Independent with assistive device Stand to Sit: Independent with assistive device   Trunk/Postural Assessment  Cervical Assessment Cervical Assessment: Within Functional Limits Thoracic Assessment Thoracic Assessment: Exceptions to [facility] (Forward flexed) Lumbar Assessment Lumbar Assessment: Exceptions to [facility] (posterior pelvic tilt) Postural Control Postural Control: Deficits on evaluation Trunk Control: decreased in standing Protective Responses: delayed but has increased since evaluation  Balance Balance Balance Assessed: Yes Standardized Balance Assessment Standardized Balance Assessment: Timed Up and Go Test Static Sitting Balance Static Sitting - Level of Assistance: 7: Independent Dynamic Sitting Balance Dynamic Sitting - Level of Assistance: 7: Independent Static Standing Balance Static Standing - Level of Assistance: 7: Independent Dynamic Standing Balance Dynamic Standing - Level of Assistance: 6: Modified independent (Device/Increase time) Extremity/Trunk Assessment RUE Assessment RUE Assessment: Within Functional Limits LUE Assessment LUE Assessment: Within Functional Limits Passive Range of Motion (PROM) Comments: WFL, no longer has shoulder pain Active Range of Motion (AROM) Comments: WFL   Drayden Lukas 05/16/2023, 5:00 PM

## 2023-05-16 NOTE — Progress Notes (Signed)
 NEUROLOGY CONSULT FOLLOW UP NOTE   Date of service: May 16, 2023 Patient Name: Brittany Mullins MRN:  [id number] DOB:  1980/12/18  Interval Hx/subjective   Reconsulted for L hemianopsia noted today by OT. She is sitting in a chair writing a valentines card for signfiicant other who is laying in the patient bed.  Vitals   Vitals:   05/15/23 2254 05/15/23 2300 05/16/23 0614 05/16/23 1545  BP: (!) 129/56 (!) 129/56 111/69 120/63  Pulse: 81 81 75 98  Resp: 18 18 17 20   Temp: 98 F (36.7 C) 98 F (36.7 C) 98.4 F (36.9 C) 98.1 F (36.7 C)  TempSrc:   Oral   SpO2: 98% 98% 95% 96%  Weight:      Height:         Body mass index is 28.35 kg/m.  Physical Exam   General: Laying comfortably in bed; in no acute distress.  HENT: Normal oropharynx and mucosa. Normal external appearance of ears and nose.  Neck: Supple, no pain or tenderness  CV: No JVD. No peripheral edema.  Pulmonary: Symmetric Chest rise. Normal respiratory effort.  Abdomen: Soft to touch, non-tender.  Ext: No cyanosis, edema, or deformity  Skin: No rash. Normal palpation of skin.   Musculoskeletal: Normal digits and nails by inspection. No clubbing.   Neurologic Examination  Mental status/Cognition: Alert, oriented to self, place, month and year, good attention.  Speech/language: Fluent, comprehension intact, object naming intact, repetition intact.  Cranial nerves:   CN II Pupils equal and reactive to light, L hemianopsia but appears to have inconsistent responses to visual field testing. When distracted, does seem to do finger to nose quite well on the left and blinks to threat on the left.   CN III,IV,VI EOM intact, no gaze preference or deviation, no nystagmus    CN V normal sensation in V1, V2, and V3 segments bilaterally    CN VII no asymmetry, no nasolabial fold flattening    CN VIII normal hearing to speech    CN IX & X normal palatal elevation, no uvular deviation    CN XI 5/5 head turn  and 5/5 shoulder shrug bilaterally    CN XII midline tongue protrusion    Motor:  Muscle bulk: normal, tone normal, pronator drift none tremor none Mvmt Root Nerve  Muscle Right Left Comments  SA C5/6 Ax Deltoid 4+ 5   EF C5/6 Mc Biceps 4+ 5   EE C6/7/8 Rad Triceps 4+ 5   WF C6/7 Med FCR     WE C7/8 PIN ECU     F Ab C8/T1 U ADM/FDI 4+ 5   HF L1/2/3 Fem Illopsoas 4+ 5   KE L2/3/4 Fem Quad     DF L4/5 D Peron Tib Ant 5 5   PF S1/2 Tibial Grc/Sol 5 5    Sensation:  Light touch Slightly decreased in RUE   Pin prick    Temperature    Vibration   Proprioception    Coordination/Complex Motor:  - Finger to Nose intact BL - Rapid alternating movement are slightly slowed on the right. - Gait: deferred.  Medications  Current Facility-Administered Medications:    acetaminophen (TYLENOL) tablet 325-650 mg, 325-650 mg, Oral, Q4H PRN, Love, Pamela S, PA-C, 650 mg at 05/15/23 1149   albuterol (PROVENTIL) (2.5 MG/3ML) 0.083% nebulizer solution 2.5 mg, 2.5 mg, Nebulization, Q2H PRN, Love, Pamela S, PA-C   alum & mag hydroxide-simeth (MAALOX/MYLANTA) 200-200-20 MG/5ML suspension 30 mL, 30 mL, Oral, Q4H  PRN, Love, Pamela S, PA-C   amLODipine (NORVASC) tablet 5 mg, 5 mg, Oral, Daily, Love, Pamela S, PA-C, 5 mg at 05/16/23 2130   ARIPiprazole (ABILIFY) tablet 5 mg, 5 mg, Oral, Daily, Love, Pamela S, PA-C, 5 mg at 05/16/23 8657   aspirin EC tablet 81 mg, 81 mg, Oral, Daily, Love, Pamela S, PA-C, 81 mg at 05/16/23 0908   atorvastatin (LIPITOR) tablet 40 mg, 40 mg, Oral, Daily, Love, Pamela S, PA-C, 40 mg at 05/16/23 8469   bisacodyl (DULCOLAX) suppository 10 mg, 10 mg, Rectal, Daily PRN, Love, Pamela S, PA-C   blood pressure control book, , Does not apply, Once, Kirsteins, Victorino Sparrow, MD   capsicum (ZOSTRIX) 0.075 % cream, , Topical, BID, Kirsteins, Victorino Sparrow, MD, Given at 05/16/23 0909   cyclobenzaprine (FLEXERIL) tablet 5 mg, 5 mg, Oral, TID PRN, Love, Pamela S, PA-C, 5 mg at 05/16/23 1238    cyclobenzaprine (FLEXERIL) tablet 5 mg, 5 mg, Oral, Once, Street, [place], PA-C   enoxaparin (LOVENOX) injection 40 mg, 40 mg, Subcutaneous, Q24H, Love, Pamela S, PA-C, 40 mg at 05/16/23 1850   gabapentin (NEURONTIN) capsule 300 mg, 300 mg, Oral, BID, Kirsteins, Victorino Sparrow, MD, 300 mg at 05/16/23 6295   gabapentin (NEURONTIN) capsule 400 mg, 400 mg, Oral, QHS, Kirsteins, Victorino Sparrow, MD, 400 mg at 05/15/23 2105   guaiFENesin-dextromethorphan (ROBITUSSIN DM) 100-10 MG/5ML syrup 5-10 mL, 5-10 mL, Oral, Q6H PRN, Love, Pamela S, PA-C   hydrocortisone (CORTEF) tablet 5 mg, 5 mg, Oral, QAC supper, 5 mg at 05/16/23 1850 **AND** hydrocortisone (CORTEF) tablet 10 mg, 10 mg, Oral, Q breakfast, Love, Pamela S, PA-C, 10 mg at 05/16/23 2841   hydrOXYzine (ATARAX) tablet 10 mg, 10 mg, Oral, TID PRN, Erick Colace, MD, 10 mg at 05/16/23 1340   lamoTRIgine (LAMICTAL) tablet 25 mg, 25 mg, Oral, BID, Kirsteins, Victorino Sparrow, MD, 25 mg at 05/16/23 3244   levETIRAcetam (KEPPRA) tablet 500 mg, 500 mg, Oral, Q8H, Kirsteins, Victorino Sparrow, MD, 500 mg at 05/16/23 1340   levonorgestrel-ethinyl estradiol (ALESSE) 0.1-20 MG-MCG per tablet 1 tablet, 1 tablet, Oral, Daily, Kirsteins, Victorino Sparrow, MD, 1 tablet at 05/16/23 0102   losartan (COZAAR) tablet 25 mg, 25 mg, Oral, Daily, Love, Pamela S, PA-C, 25 mg at 05/16/23 0908   methadone (DOLOPHINE) tablet 120 mg, 120 mg, Oral, Daily, Love, Pamela S, PA-C, 120 mg at 05/16/23 7253   naloxone [facility]) injection 0.4 mg, 0.4 mg, Intravenous, PRN, Love, Pamela S, PA-C   pantoprazole (PROTONIX) EC tablet 40 mg, 40 mg, Oral, Q0600, Love, Pamela S, PA-C, 40 mg at 05/16/23 0706   polyethylene glycol (MIRALAX / GLYCOLAX) packet 17 g, 17 g, Oral, Daily PRN, Love, Pamela S, PA-C   polyvinyl alcohol (LIQUIFILM TEARS) 1.4 % ophthalmic solution 1 drop, 1 drop, Both Eyes, BID, Kirsteins, Victorino Sparrow, MD, 1 drop at 05/16/23 1144   prochlorperazine (COMPAZINE) tablet 5-10 mg, 5-10 mg, Oral, Q6H PRN, 10 mg at  05/12/23 2215 **OR** prochlorperazine (COMPAZINE) suppository 12.5 mg, 12.5 mg, Rectal, Q6H PRN **OR** prochlorperazine (COMPAZINE) injection 5-10 mg, 5-10 mg, Intravenous, Q6H PRN, Love, Pamela S, PA-C, 10 mg at 05/06/23 1120   senna-docusate (Senokot-S) tablet 2 tablet, 2 tablet, Oral, Q0600, Love, Pamela S, PA-C, 2 tablet at 05/16/23 0909   sodium phosphate (FLEET) enema 1 enema, 1 enema, Rectal, Once PRN, Love, Pamela S, PA-C   sorbitol 70 % solution 60 mL, 60 mL, Oral, Daily PRN, Love, Pamela S, PA-C   traZODone (DESYREL) tablet  50 mg, 50 mg, Oral, QHS PRN, Akintayo, Musa A, MD, 50 mg at 05/15/23 1808  Labs and Diagnostic Imaging   CBC:  Recent Labs  Lab 05/13/23 0532  WBC 5.5  HGB 12.4  HCT 37.4  MCV 93.0  PLT 263    Basic Metabolic Panel:  Lab Results  Component Value Date   NA 136 05/16/2023   K 3.8 05/16/2023   CO2 26 05/16/2023   GLUCOSE 60 (L) 05/16/2023   BUN 18 05/16/2023   CREATININE 0.77 05/16/2023   CALCIUM 8.8 (L) 05/16/2023   GFRNONAA >60 05/16/2023   GFRAA >60 10/27/2019   Lipid Panel:  Lab Results  Component Value Date   LDLCALC 94 04/23/2023   HgbA1c:  Lab Results  Component Value Date   HGBA1C 5.0 04/23/2023   Urine Drug Screen:     Component Value Date/Time   LABOPIA NONE DETECTED 05/05/2023 1048   COCAINSCRNUR Negative 05/06/2023 0500   LABBENZ POSITIVE (A) 05/05/2023 1048   AMPHETMU NONE DETECTED 05/05/2023 1048   THCU POSITIVE (A) 05/05/2023 1048   LABBARB Negative 05/06/2023 0500   LABBARB NONE DETECTED 05/05/2023 1048    Alcohol Level     Component Value Date/Time   ETH <10 04/22/2023 0752   INR  Lab Results  Component Value Date   INR 0.97 12/21/2017   APTT No results found for: "APTT" AED levels: No results found for: "PHENYTOIN", "ZONISAMIDE", "LAMOTRIGINE", "LEVETIRACETA"  Repeat MRI Brain: Pending  Assessment   Brittany Mullins is a 43 y.o. 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  We were asked to evaluate her for concern for L hemianopsia. Exam seems consistent with this being functional. She does blink to threat on the left. Does seem to look at things on the left and do finger to nose testing on the left when distracted. Unclear LKW. Will get MRI Brain w/o contrast.  Recommendations  - agree with MRI Brain w.o contrast. - continue Keppra 750 TID - Continue Lamotrigine 25mg  BID - no further workup if MRI negative. ______________________________________________________________________  Plan discussed with Brittany Mullins.  Signed, Erick Blinks, MD [facility]

## 2023-05-16 NOTE — Progress Notes (Signed)
Speech Language Pathology Discharge Summary  Patient Details  Name: Brittany Mullins MRN: [id number] Date of Birth: 1981/03/31  Date of Discharge from SLP service:May 16, 2023  Today's Date: 05/16/2023 SLP Individual Time: 0800-0857 SLP Individual Time Calculation (min): 57 min   Skilled Therapeutic Interventions:   Skilled therapy session focused on cognitive and communication goals. SLP re-evaluated patients cognitive-linguistic goals through recompletion of the Cognistat standardized assessment. Patient scored WFL on all subtests with the exception of mild deficits immediate recall. Patient with significant gains since evaluation in the areas of memory, attention and problem solving. SLP also re-evaluated respiratory strength through measuring maximum expiratory pressures and sustained phonation. Patient with a MEP of 55cm H2O and maximum sustained phonation of 17 seconds, an improvement since evaluation and prior sessions. Patient with 100% intelligibility at the conversational level and reports major improvement in speech. Patient benefited from deep breathing to reduce anxiety during the evaluation. Patient left in bed with alarm set and call bell in reach. Continue POC.      Patient has met 4 of 5 long term goals.  Patient to discharge at overall Modified Independent;Supervision level.  Reasons goals not met: cont to require occasional minA for memory   Clinical Impression/Discharge Summary:  Pt has made good gains and has met 4 of 5 LTG's this admission due to improved cognition, communication and swallowing. Pt is currently an overall supervision-minA for cognitive tasks and requires mod i cues for utilization of swallowing compensatory strategies to minimize overt s/sx of aspiration with regular/thin diet. Patient is 100% intelligible at the conversational level, however with continued reduced respiratory support. Pt/family education complete and pt will discharge home  with supervision from friends/family/etc. Pt would benefit from f/u ST services to maximize cognition and communication in order to maximize functional independence.   Care Partner:  Caregiver Able to Provide Assistance: Yes  Type of Caregiver Assistance: Physical;Cognitive  Recommendation:  Home Health SLP;Outpatient SLP  Rationale for SLP Follow Up: Maximize cognitive function and independence   Equipment: n/a   Reasons for discharge: Discharged from hospital   Patient/Family Agrees with Progress Made and Goals Achieved: Yes    Lisha Vitale M.A., CF-SLP 05/16/2023, 7:39 AM

## 2023-05-16 NOTE — Progress Notes (Signed)
Physical Therapy Session Note  Patient Details  Name: Brittany Mullins MRN: [id number] Date of Birth: May 06, 1980  Today's Date: 05/16/2023 PT Individual Time: 0909-0950 PT Individual Time Calculation (min): 41 min   Short Term Goals: Week 2:  PT Short Term Goal 1 (Week 2): = LTGs  Skilled Therapeutic Interventions/Progress Updates: Patient supine in bed finishing breakfast with significant other (SO) and nsg present on entrance to room. Patient alert and agreeable to PT session.   Patient reported 5-6/10 HA pain with medication provided prior. 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 Pt and SO consoled and provided with active listening. PTA discussed any potential triggers with pt and SO acknowledging several events that led up to that episode. Pt and SO encouraged to keep in heavy mind that pt leaving hospital tomorrow to a new apartment could potentially be a bit of a shock, and to avoid increasing triggering events (and to write down any events that occur that lead to an episode occurring - pt and SO seemingly understood).   Therapeutic Activity: Bed Mobility: Pt performed supine<sit on EOB independently.  Transfers: Pt performed sit<>stand transfers throughout session with modI in rollator.  TUG (avg = 16.677) in RW from hi/low mat with supervision.   - 20.02s  - 15.52s  - 14.49s  Gait Training:  Pt ambulated from room<>ortho gym using rollator with supervision/modI. Pt still with B knees in flexion leading to flexed hips (pt encouraged to continue with therex of lengthening/strengthening posterior chain musculature LB with future therapists).  Patient sitting EOB at end of session with brakes locked, SO present and all needs within reach.      Therapy Documentation Precautions:  Precautions Precautions: Fall Restrictions Weight Bearing  Restrictions Per Provider Order: No   Therapy/Group: Individual Therapy  Kieryn Burtis PTA 05/16/2023, 12:29 PM

## 2023-05-16 NOTE — Progress Notes (Addendum)
Patient ID: Michelina Mexicano, female   DOB: Jun 25, 1980, 43 y.o.   MRN: [id number]  HHPT/OT/SLP referral accepted by [facility].   *SW called pt to inform on above, and get new address. Pt will remain in same apartment complex, new apartment- [address]. States they will get keys today.   [address] [place] [address]   *SW called [facility] to discuss patient and send med list but no answer. SW will follow-up.   SW received return phone call from [facility] answering service and reports a message will be sent to office and request to follow-up if they will require medication list.   SW spoke with Dr. Janell Quiet with [facility] and stated for pt to bring in her AVS and they will still call to follow-up to verify her medications as well.  Cecile Sheerer, MSW, LCSW Office: [phone] Cell: [phone] Fax: [phone]

## 2023-05-16 NOTE — Progress Notes (Incomplete Revision)
Patient ID: Ryleigh Esqueda, female   DOB: 1981-02-07, 43 y.o.   MRN: [id number]  HHPT/OT/SLP referral accepted by [facility].   *SW called pt to inform on above, and get new address. Pt will remain in same apartment complex, new apartment- [address]. States they will get keys today.   [address] [place] [address]   *SW called [facility] to discuss patient and send med list but no answer. SW will follow-up.   SW received return phone call from [facility] answering service and reports a message will be sent to office and request to follow-up if they will require medication list.   SW spoke with Dr. Janell Quiet with [facility] and stated for pt to bring in her AVS and they will still call to follow-up to verify her medications as well.    [facility] confirms [facility] referral received. Will wait for nursing to follow-up abotu phoen assessment.   Cecile Sheerer, MSW, LCSW Office: [phone] Cell: [phone] Fax: [phone]

## 2023-05-16 NOTE — Plan of Care (Signed)
  Problem: RH Balance Goal: LTG: Patient will maintain dynamic sitting balance (OT) Description: LTG:  Patient will maintain dynamic sitting balance with assistance during activities of daily living (OT) Outcome: Completed/Met Goal: LTG Patient will maintain dynamic standing with ADLs (OT) Description: LTG:  Patient will maintain dynamic standing balance with assist during activities of daily living (OT)  Outcome: Completed/Met   Problem: Sit to Stand Goal: LTG:  Patient will perform sit to stand in prep for activites of daily living with assistance level (OT) Description: LTG:  Patient will perform sit to stand in prep for activites of daily living with assistance level (OT) Outcome: Completed/Met   Problem: RH Eating Goal: LTG Patient will perform eating w/assist, cues/equip (OT) Description: LTG: Patient will perform eating with assist, with/without cues using equipment (OT) Outcome: Completed/Met   Problem: RH Grooming Goal: LTG Patient will perform grooming w/assist,cues/equip (OT) Description: LTG: Patient will perform grooming with assist, with/without cues using equipment (OT) Outcome: Completed/Met   Problem: RH Bathing Goal: LTG Patient will bathe all body parts with assist levels (OT) Description: LTG: Patient will bathe all body parts with assist levels (OT) Outcome: Completed/Met   Problem: RH Dressing Goal: LTG Patient will perform upper body dressing (OT) Description: LTG Patient will perform upper body dressing with assist, with/without cues (OT). Outcome: Completed/Met Goal: LTG Patient will perform lower body dressing w/assist (OT) Description: LTG: Patient will perform lower body dressing with assist, with/without cues in positioning using equipment (OT) Outcome: Completed/Met   Problem: RH Toileting Goal: LTG Patient will perform toileting task (3/3 steps) with assistance level (OT) Description: LTG: Patient will perform toileting task (3/3 steps) with  assistance level (OT)  Outcome: Completed/Met   Problem: RH Functional Use of Upper Extremity Goal: LTG Patient will use RT/LT upper extremity as a (OT) Description: LTG: Patient will use right/left upper extremity as a stabilizer/gross assist/diminished/nondominant/dominant level with assist, with/without cues during functional activity (OT) Outcome: Completed/Met   Problem: RH Toilet Transfers Goal: LTG Patient will perform toilet transfers w/assist (OT) Description: LTG: Patient will perform toilet transfers with assist, with/without cues using equipment (OT) Outcome: Completed/Met   Problem: RH Tub/Shower Transfers Goal: LTG Patient will perform tub/shower transfers w/assist (OT) Description: LTG: Patient will perform tub/shower transfers with assist, with/without cues using equipment (OT) Outcome: Completed/Met   Problem: RH Awareness Goal: LTG: Patient will demonstrate awareness during functional activites type of (OT) Description: LTG: Patient will demonstrate awareness during functional activites type of (OT) Outcome: Completed/Met   Problem: RH Simple Meal Prep Goal: LTG Patient will perform simple meal prep w/assist (OT) Description: LTG: Patient will perform simple meal prep with assistance, with/without cues (OT). Outcome: Completed/Met   Problem: RH Light Housekeeping Goal: LTG Patient will perform light housekeeping w/assist (OT) Description: LTG: Patient will perform light housekeeping with assistance, with/without cues (OT). Outcome: Completed/Met

## 2023-05-16 NOTE — Progress Notes (Signed)
Informed by PTA that patient reporting blurry vision in left eye today--worse and different from before. Patient reports no improvement with eye drops this am--additional drops applied and will change to TID. OT notes reviewed per patient report of field cut on testing and note that new onset of visual field cut documented. Will order CT head for follow up.

## 2023-05-17 ENCOUNTER — Inpatient Hospital Stay (HOSPITAL_COMMUNITY): Payer: Medicare Other

## 2023-05-17 DIAGNOSIS — I69398 Other sequelae of cerebral infarction: Secondary | ICD-10-CM | POA: Diagnosis not present

## 2023-05-17 DIAGNOSIS — H539 Unspecified visual disturbance: Secondary | ICD-10-CM

## 2023-05-17 DIAGNOSIS — I639 Cerebral infarction, unspecified: Secondary | ICD-10-CM | POA: Diagnosis not present

## 2023-05-17 MED ORDER — TRAZODONE HCL 50 MG PO TABS
50.0000 mg | ORAL_TABLET | Freq: Every evening | ORAL | 0 refills | Status: AC | PRN
  Filled 2023-05-17: qty 30, 30d supply, fill #0

## 2023-05-17 NOTE — Progress Notes (Signed)
Recreational Therapy Discharge Summary Patient Details  Name: Brittany Mullins MRN: [id number] Date of Birth: 1981-03-02 Today's Date: 05/17/2023  Comments on progress toward goals: Pt has made great progress during LOS and is discharging home at overall supervision ambulatory level with rollator. TR sessions focused on pt eduction emphasizing activity analysis/modifications, stress management and leisure education.  Pt also participated in animal assisted activities during LOS.  Pt is excited to return home and to previously enjoyed activities as able.  Reasons for discharge: discharge from hospital  Follow-up: Home Health  Patient/family agrees with progress made and goals achieved: Yes  Quincie Haroon 05/17/2023, 11:27 AM

## 2023-05-17 NOTE — Progress Notes (Signed)
NEUROLOGY CONSULT FOLLOW UP NOTE   Date of service: May 17, 2023 Patient Name: Brittany Mullins MRN:  [id number] DOB:  [birth year]  Interval Hx/subjective   Patient sleeping in bed, easily awakens. Still c/o left hemianopsia. Intermittent blink to threat on left. Complaints range from double vision to blurry vision to spots seen. States that this started after she had a seizure a couple of nights ago.  States she is very claustrophobic and can not get an MRI unless she gets "something to put me to sleep"  Patient says she has shingles in her left eye a couple of years ago. She went to the optometrist after that and was told she needed glasses, but never got them.   Vitals   Vitals:   05/16/23 0614 05/16/23 1545 05/16/23 2058 05/17/23 0640  BP: 111/69 120/63 (!) 103/57 102/69  Pulse: 75 98 80 77  Resp: 17 20 14 16   Temp: 98.4 F (36.9 C) 98.1 F (36.7 C) 98.2 F (36.8 C) 98.3 F (36.8 C)  TempSrc: Oral   Oral  SpO2: 95% 96% 97% 99%  Weight:      Height:         Body mass index is 28.35 kg/m.  Physical Exam   General: Laying comfortably in bed; in no acute distress.  HENT: Normal oropharynx and mucosa. Normal external appearance of ears and nose.  Neck: Supple, no pain or tenderness  CV: No JVD. No peripheral edema.  Pulmonary: Symmetric Chest rise. Normal respiratory effort.  Abdomen: Soft to touch, non-tender.  Ext: No cyanosis, edema, or deformity  Skin: No rash. Normal palpation of skin.   Musculoskeletal: Normal digits and nails by inspection. No clubbing.   Neurologic Examination   Neuro: Mental Status: Patient is drowsy, alert, oriented to person, place, month, year, and situation. Patient is able to give a clear and coherent history. No signs of aphasia or neglect Cranial Nerves: II: Pupils are equal, round, and reactive to light. Subjective decreased vision on left. Intermittent blink to threat o left, improved when distracted.   III,IV, VI:  EOMI without ptosis or diploplia.  V: Facial sensation is symmetric to temperature VII: Facial movement is symmetric.  VIII: hearing is intact to voice X: Uvula elevates symmetrically XI: Shoulder shrug is symmetric. XII: tongue is midline without atrophy or fasciculations.  Motor: Tone is normal. Bulk is normal.  4+/5 RUE/RLE Sensory: Slightly decreased in RUE Cerebellar: FNF and HKS are intact bilaterally  Medications  Current Facility-Administered Medications:    acetaminophen (TYLENOL) tablet 325-650 mg, 325-650 mg, Oral, Q4H PRN, Love, Pamela S, PA-C, 650 mg at 05/15/23 1149   albuterol (PROVENTIL) (2.5 MG/3ML) 0.083% nebulizer solution 2.5 mg, 2.5 mg, Nebulization, Q2H PRN, Love, Pamela S, PA-C   alum & mag hydroxide-simeth (MAALOX/MYLANTA) 200-200-20 MG/5ML suspension 30 mL, 30 mL, Oral, Q4H PRN, Love, Pamela S, PA-C   amLODipine (NORVASC) tablet 5 mg, 5 mg, Oral, Daily, Love, Pamela S, PA-C, 5 mg at 05/17/23 2130   ARIPiprazole (ABILIFY) tablet 5 mg, 5 mg, Oral, Daily, Love, Pamela S, PA-C, 5 mg at 05/17/23 8657   aspirin EC tablet 81 mg, 81 mg, Oral, Daily, Love, Pamela S, PA-C, 81 mg at 05/17/23 8469   atorvastatin (LIPITOR) tablet 40 mg, 40 mg, Oral, Daily, Love, Pamela S, PA-C, 40 mg at 05/17/23 6295   bisacodyl (DULCOLAX) suppository 10 mg, 10 mg, Rectal, Daily PRN, Love, Pamela S, PA-C   blood pressure control book, , Does not apply, Once, Kirsteins,  Victorino Sparrow, MD   capsicum (ZOSTRIX) 0.075 % cream, , Topical, BID, Kirsteins, Victorino Sparrow, MD, Given at 05/17/23 [phone]   cyclobenzaprine (FLEXERIL) tablet 5 mg, 5 mg, Oral, TID PRN, Love, Pamela S, PA-C, 5 mg at 05/16/23 1238   cyclobenzaprine (FLEXERIL) tablet 5 mg, 5 mg, Oral, Once, Street, [place], PA-C   enoxaparin (LOVENOX) injection 40 mg, 40 mg, Subcutaneous, Q24H, Love, Pamela S, PA-C, 40 mg at 05/16/23 1850   gabapentin (NEURONTIN) capsule 300 mg, 300 mg, Oral, BID, Kirsteins, Victorino Sparrow, MD, 300 mg at 05/17/23 9604    gabapentin (NEURONTIN) capsule 400 mg, 400 mg, Oral, QHS, Kirsteins, Victorino Sparrow, MD, 400 mg at 05/16/23 2315   guaiFENesin-dextromethorphan (ROBITUSSIN DM) 100-10 MG/5ML syrup 5-10 mL, 5-10 mL, Oral, Q6H PRN, Love, Pamela S, PA-C   hydrocortisone (CORTEF) tablet 5 mg, 5 mg, Oral, QAC supper, 5 mg at 05/16/23 1850 **AND** hydrocortisone (CORTEF) tablet 10 mg, 10 mg, Oral, Q breakfast, Love, Pamela S, PA-C, 10 mg at 05/17/23 5409   hydrOXYzine (ATARAX) tablet 10 mg, 10 mg, Oral, TID PRN, Erick Colace, MD, 10 mg at 05/16/23 1340   lamoTRIgine (LAMICTAL) tablet 25 mg, 25 mg, Oral, BID, Kirsteins, Victorino Sparrow, MD, 25 mg at 05/17/23 8119   levETIRAcetam (KEPPRA) tablet 500 mg, 500 mg, Oral, Q8H, Kirsteins, Victorino Sparrow, MD, 500 mg at 05/17/23 0600   levonorgestrel-ethinyl estradiol (ALESSE) 0.1-20 MG-MCG per tablet 1 tablet, 1 tablet, Oral, Daily, Kirsteins, Victorino Sparrow, MD, 1 tablet at 05/17/23 1478   LORazepam (ATIVAN) injection 1 mg, 1 mg, Intravenous, Once, Love, Pamela S, PA-C   losartan (COZAAR) tablet 25 mg, 25 mg, Oral, Daily, Love, Pamela S, PA-C, 25 mg at 05/17/23 2956   methadone (DOLOPHINE) tablet 120 mg, 120 mg, Oral, Daily, Love, Pamela S, PA-C, 120 mg at 05/17/23 0920   naloxone [facility]) injection 0.4 mg, 0.4 mg, Intravenous, PRN, Love, Pamela S, PA-C   pantoprazole (PROTONIX) EC tablet 40 mg, 40 mg, Oral, Q0600, Love, Pamela S, PA-C, 40 mg at 05/17/23 0600   polyethylene glycol (MIRALAX / GLYCOLAX) packet 17 g, 17 g, Oral, Daily PRN, Love, Pamela S, PA-C   polyvinyl alcohol (LIQUIFILM TEARS) 1.4 % ophthalmic solution 1 drop, 1 drop, Both Eyes, BID, Kirsteins, Victorino Sparrow, MD, 1 drop at 05/17/23 2130   prochlorperazine (COMPAZINE) tablet 5-10 mg, 5-10 mg, Oral, Q6H PRN, 10 mg at 05/12/23 2215 **OR** prochlorperazine (COMPAZINE) suppository 12.5 mg, 12.5 mg, Rectal, Q6H PRN **OR** prochlorperazine (COMPAZINE) injection 5-10 mg, 5-10 mg, Intravenous, Q6H PRN, Love, Pamela S, PA-C, 10 mg at 05/06/23  1120   senna-docusate (Senokot-S) tablet 2 tablet, 2 tablet, Oral, Q0600, Love, Pamela S, PA-C, 2 tablet at 05/17/23 0600   sodium phosphate (FLEET) enema 1 enema, 1 enema, Rectal, Once PRN, Love, Pamela S, PA-C   sorbitol 70 % solution 60 mL, 60 mL, Oral, Daily PRN, Love, Pamela S, PA-C   traZODone (DESYREL) tablet 50 mg, 50 mg, Oral, QHS PRN, Akintayo, Musa A, MD, 50 mg at 05/15/23 1808  Labs and Diagnostic Imaging   CBC:  Recent Labs  Lab 05/13/23 0532  WBC 5.5  HGB 12.4  HCT 37.4  MCV 93.0  PLT 263    Basic Metabolic Panel:  Lab Results  Component Value Date   NA 136 05/16/2023   K 3.8 05/16/2023   CO2 26 05/16/2023   GLUCOSE 60 (L) 05/16/2023   BUN 18 05/16/2023   CREATININE 0.77 05/16/2023   CALCIUM 8.8 (L) 05/16/2023  GFRNONAA >60 05/16/2023   GFRAA >60 10/27/2019   Lipid Panel:  Lab Results  Component Value Date   LDLCALC 94 04/23/2023   HgbA1c:  Lab Results  Component Value Date   HGBA1C 5.0 04/23/2023   Urine Drug Screen:     Component Value Date/Time   LABOPIA NONE DETECTED 05/05/2023 1048   COCAINSCRNUR Negative 05/06/2023 0500   LABBENZ POSITIVE (A) 05/05/2023 1048   AMPHETMU NONE DETECTED 05/05/2023 1048   THCU POSITIVE (A) 05/05/2023 1048   LABBARB Negative 05/06/2023 0500   LABBARB NONE DETECTED 05/05/2023 1048    Alcohol Level     Component Value Date/Time   ETH <10 04/22/2023 0752   INR  Lab Results  Component Value Date   INR 0.97 12/21/2017   APTT No results found for: "APTT" AED levels: No results found for: "PHENYTOIN", "ZONISAMIDE", "LAMOTRIGINE", "LEVETIRACETA"  CT Head without contrast(Personally reviewed):  No evidence of acute intracranial abnormality. Known recent left pontine infarct without evidence of progression. 2/2: No hemorrhage or progression seen at the patient's recent left pontine infarct. No new abnormality. 1/28: Similar focal low-density in the left para median pons without evidence of extension or  hemorrhage, consistent with the acute infarction in that location. No other focal brain finding. 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 1/26: Question loss of gray-white matter differentiation along the right frontal lobe-concern for acute infarction  CT angio Head and Neck with contrast(Personally reviewed): 1/20: No evidence of acute intracranial abnormality. 2. Motion degraded CTA. No large vessel occlusion or evidence of a significant stenosis in the neck. Nondiagnostic assessment for intracranial stenosis.  MRI Brain(Personally reviewed): 1/23:  Partially empty sella turcica. No evidence of a pituitary mass. 2. Known 14 mm acute infarct within the left aspect of the pons. 3. There are a few small nonspecific chronic insults within the cerebral white matter. 4. Paranasal sinus disease at the imaged levels as described. 5. Abnormal T1 hypointense marrow signal within the calvarium and within visualized portions of the cervical spine. While these findings can reflect a marrow infiltrative process, the most common causes include chronic anemia, smoking and obesity.  1/20: Acute left pontine infarct.   LTM EEG 2/3 1003 to 2/4 0857:  This study is within normal limits. No seizures or epileptiform discharges were seen throughout the recording.   Event button was pressed on2/07/2023 at 0629 as described above without concomitant EEG change.  This was a nonepileptic event.  Assessment   Tinnie Kunin is a 43 y.o. 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   We were asked to evaluate her for concern for L hemianopsia. Exam seems consistent with  this being functional. She does blink to threat on the left. Does seem to look at things on the left and do finger to nose testing on the left when distracted. Unclear LKW.   Unable to tolerate MRI due to patient movement, repeat CT completed--negative.   Recommendations  - continue Keppra 750mg  TID - continue Lamotrigine 25mg  BID - follow up with outpatient neurology - follow up with outpatient opthalmology  SEIZURE PRECAUTIONS Per [facility] statutes, patients with seizures are not allowed to drive until they have been seizure-free for six months.  Use caution when using heavy equipment or power tools. Avoid working on ladders or at heights. Take showers instead of baths. Ensure the water temperature is not too high on the home water heater. Do not go swimming alone. Do not lock yourself in a room alone (i.e. bathroom). When caring for infants or small children, sit down when holding, feeding, or changing them to minimize risk of injury to the child in the event you have a seizure. Maintain good sleep hygiene. Avoid alcohol.    If patient has another seizure, call 911 and bring them back to the ED if: A.  The seizure lasts longer than 5 minutes.      B.  The patient doesn't wake shortly after the seizure or has new problems such as difficulty seeing, speaking or moving following the seizure C.  The patient was injured during the seizure D.  The patient has a temperature over 102 F (39C) E.  The patient vomited during the seizure and now is having trouble breathing  ______________________________________________________________________   Pt seen by Neuro NP/APP and later by MD. Note/plan to be edited by MD as needed.    Lynnae January, DNP, AGACNP-BC [facility] Please use AMION for contact information & EPIC for messaging.  NEUROHOSPITALIST ADDENDUM Performed a face to face diagnostic evaluation.   I have reviewed the contents of history and physical exam as  documented by PA/ARNP/Resident and agree with above documentation.  I have discussed and formulated the above plan as documented. Edits to the note have been made as needed.  Impression/Key exam findings/Plan: She declined MRI citing claustrophobia.  Repeat 24 hour CT head pending.  Erick Blinks, MD [facility] [phone]   If 7pm to 7am, please call on call as listed on AMION.

## 2023-05-17 NOTE — Progress Notes (Signed)
PROGRESS NOTE   Subjective/Complaints:  No spells but pt still c/o blurring of vision  Per Neuro risk of GA for MRI outweighs benefit , serial CT scan recommended , first one today no new CVA  ROS: Patient denies nausea, vomiting, diarrhea, cough, shortness of breath or chest pain,       Objective:   CT HEAD WO CONTRAST ( ) Result Date: 05/17/2023 CLINICAL DATA:  Stroke, follow up follow up EXAM: CT HEAD WITHOUT CONTRAST TECHNIQUE: Contiguous axial images were obtained from the base of the skull through the vertex without intravenous contrast. RADIATION DOSE REDUCTION: This exam was performed according to the departmental dose-optimization program which includes automated exposure control, adjustment of the mA and/or kV according to patient size and/or use of iterative reconstruction technique. COMPARISON:  CT head May 05, 2023. FINDINGS: Brain: No evidence of new acute infarction, hemorrhage, hydrocephalus, extra-axial collection or mass lesion/mass effect. Known recent left pontine infarct without evidence of progression. Vascular: No hyperdense vessel. Skull: No acute fracture. Sinuses/Orbits: Clear sinuses.  No acute orbital findings. Other: No mastoid effusions. IMPRESSION: 1. No evidence of acute intracranial abnormality. 2. Known recent left pontine infarct without evidence of progression. Electronically Signed   By: Feliberto Harts M.D.   On: 05/17/2023 00:24    No results for input(s): "WBC", "HGB", "HCT", "PLT" in the last 72 hours.   Recent Labs    05/16/23 0751  NA 136  K 3.8  CL 99  CO2 26  GLUCOSE 60*  BUN 18  CREATININE 0.77  CALCIUM 8.8*      Intake/Output Summary (Last 24 hours) at 05/17/2023 0750 Last data filed at 05/16/2023 1800 Gross per 24 hour  Intake 590 ml  Output --  Net 590 ml        Physical Exam: Vital Signs Blood pressure 102/69, pulse 77, temperature 98.3 F (36.8 C),  temperature source Oral, resp. rate 16, height 5\' 2"  (1.575 m), weight 70.3 kg, SpO2 99%.   General: No acute distress Mood and affect are appropriate Heart: Regular rate and rhythm no rubs murmurs or extra sounds Lungs: Clear to auscultation, breathing unlabored, no rales or wheezes Abdomen: Positive bowel sounds, soft nontender to palpation, nondistended Extremities: No clubbing, cyanosis, or edema  Skin: No evidence of breakdown, no evidence of rash Neurologic: Cranial nerves II through XII intact, motor strength is 4- /5 in right deltoid, bicep, tricep,RIght  grip, 5/5 Left grip, 5/5 left and 4/5 right hip flexor, knee extensors, ankle dorsiflexor and plantar flexor Some pain inhibition LUE  Sensory exam hypersensitivityequal in BUE Neuro:  Eyes without evidence of nystagmus  Tone is normal without evidence of spasticity Cerebellar exam shows no evidence of ataxia on finger nose finger or heel to shin testing   Cranial nerves II- Visual fields reduced confrontation left ,  III- no evidence of ptosis, upward, downward and medial gaze intact IV- no vertical diplopia or head tilt V- no facial numbness or masseter weakness VI- no pupil abduction weakness VII- no facial droop, good lid closure VII- normal auditory acuity IX- no hoarseness X-  no hoarseness XI- no trap or SCM weakness XII- no glossal weakness   Musculoskeletal:  Full range of motion in all 4 extremities. No joint swelling. Myofascial pain of shoulder girdle improved    Assessment/Plan: 1. Functional deficits which require 3+ hours per day of interdisciplinary therapy in a comprehensive inpatient rehab setting. Physiatrist is providing close team supervision and 24 hour management of active medical problems listed below. Physiatrist and rehab team continue to assess barriers to discharge/monitor patient progress toward functional and medical goals  Care Tool:  Bathing    Body parts bathed by patient: Abdomen,  Chest, Front perineal area, Right upper leg, Left upper leg, Left lower leg, Face, Left arm, Right arm, Buttocks, Right lower leg   Body parts bathed by helper: Right lower leg, Buttocks, Right arm, Left arm     Bathing assist Assist Level: Independent with assistive device     Upper Body Dressing/Undressing Upper body dressing   What is the patient wearing?: Bra, Pull over shirt    Upper body assist Assist Level: Independent    Lower Body Dressing/Undressing Lower body dressing      What is the patient wearing?: Underwear/pull up, Pants     Lower body assist Assist for lower body dressing: Independent with assitive device     Toileting Toileting    Toileting assist Assist for toileting: Independent with assistive device     Transfers Chair/bed transfer  Transfers assist     Chair/bed transfer assist level: Independent with assistive device Chair/bed transfer assistive device:  (rollator)   Locomotion Ambulation   Ambulation assist      Assist level: Supervision/Verbal cueing Assistive device: Rollator Max distance: 300   Walk 10 feet activity   Assist     Assist level: Supervision/Verbal cueing Assistive device: Rollator   Walk 50 feet activity   Assist Walk 50 feet with 2 turns activity did not occur: Safety/medical concerns  Assist level: Supervision/Verbal cueing Assistive device: Rollator    Walk 150 feet activity   Assist Walk 150 feet activity did not occur: Safety/medical concerns  Assist level: Supervision/Verbal cueing Assistive device: Rollator    Walk 10 feet on uneven surface  activity   Assist Walk 10 feet on uneven surfaces activity did not occur: Safety/medical concerns   Assist level: Supervision/Verbal cueing Assistive device: Rollator   Wheelchair     Assist Is the patient using a wheelchair?: No Type of Wheelchair: Manual    Wheelchair assist level: Supervision/Verbal cueing Max wheelchair distance:  100    Wheelchair 50 feet with 2 turns activity    Assist        Assist Level: Supervision/Verbal cueing   Wheelchair 150 feet activity     Assist      Assist Level: Maximal Assistance - Patient 25 - 49%   Blood pressure 102/69, pulse 77, temperature 98.3 F (36.8 C), temperature source Oral, resp. rate 16, height 5\' 2"  (1.575 m), weight 70.3 kg, SpO2 99%.  Medical Problem List and Plan: 1. Functional deficits secondary to Lft ventral pontine CVA, likely small vessel disease             -patient may shower             -ELOS/Goals: 2/14   Mod I to Sup PT/OT/SLP         -Continue CIR therapies including PT, OT, and SLP  Per neuro repeat CT in am , if no new infarct can d/c home  2.  Antithrombotics: -DVT/anticoagulation:  Pharmaceutical: Lovenox             -  antiplatelet therapy: DAPT X 3 weeks followed by ASA alone.  3. Pain Management: On Methadone (followed by [facility]).             --now on prn oxycodone due to shoulder/arm pain reduced to 1 tab oxy IR 5, pt asking for 2 tabs, discussed concerns for sedation ( also hx OUD)             --gabapentin TID for neuropathy, increase qh dose  Slept ok but more burning RLE this am  Change gabapentin to 300mg  BID and 400mg  at bedtime  4. H/o bipolar d/o/Mood/Behavior/Sleep: LCSW to follow for evaluation and support.              -2/3 psych recommended abilify 7.5mg  q am and lamictal 25mg  bid   -2/12- per psych can titrate Lamictal q 2wks, so too early , can titrate abilify, increased to 10mg  today    Asking for anxiety med states the Ativan IV calmed her down, discussed that ativan does not interact well with methadone,  May use hydroxyzine  Reinforce self management techniques              - 5. Neuropsych/cognition: This patient is capable of making decisions on her own behalf. 6. Skin/Wound Care: Routine pressure relief measures. 7. Fluids/Electrolytes/Nutrition: Monitor I/O.    2/10 potassium 4.0, continue daily  supplement      Latest Ref Rng & Units 05/16/2023    7:51 AM 05/13/2023    5:32 AM 05/09/2023    5:48 AM  BMP  Glucose 70 - 99 mg/dL 60  81  71   BUN 6 - 20 mg/dL 18  20  18    Creatinine 0.44 - 1.00 mg/dL 9.60  4.54  0.98   Sodium 135 - 145 mmol/L 136  140  137   Potassium 3.5 - 5.1 mmol/L 3.8  4.0  4.0   Chloride 98 - 111 mmol/L 99  99  99   CO2 22 - 32 mmol/L 26  26  27    Calcium 8.9 - 10.3 mg/dL 8.8  9.4  8.9     8. HTN: Monitor BP TID--continue Cozaar and Norvasc.    Bp's currently tolerable 9. 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 Addison's disease: Now on Cortef 10 mg am/5 mg pm 11. Chronic pancreatitis: Has chronic issues with nausea --continue phenergan --continue PPI Vomiting yesterday abd exam normal  BM this am  Possibility of new meds causing nausea  13. Constipation: Reports rectal bleeding-->colace added to senna due to hx of hems.   14. Left arm shoulder/elbow pain             -Xray elbow/shoulder 1/28 negative Suspect myofascial pain +/- frozen shoulder - improved shoulder ROM, still with upper trap trigger point -   2/3 pt received trigger point injection yesterday. ?improvement--was focused on other issues today Has pec and trap soreness with increased walker use , MSK chest pain trat symptomatically  15. Marijuana use             -Advise cessation, associated with increased stroke risk 26-44% depending on study   -2/3 uds + yesterday for thc, benzos. Urine drug profile sent to [facility], pending  -f/u UDS c/w methadone use, THC 107 16. Hx of pituitary tumor             -f/u Neurosurgery 17. Low protein S             -  f/u Hematology outpatient, will need retesting in ~41mo  18.  Hx sensorineural hearing loss with resultant dysarthria  19.  LUE numbness C5 distribution shoulder pain , ? Proximal weakness vs pain inhibition , only mild C4-5 stenosis on MRI  , MRI reviewed with  Neuroradiology , no spinal cord signal abnormalities Neurosurg, Dr Dawley looked at MRI , C spine looks stable  Still has subjective numbess in left shoulder   20.  Insomnia- likely multifactorial, anxiety regarding medical condition, underlying anxiety d/o and on higher dose steroid taper - expect some improvement with steroid taper ,  - hydroxyzine for anxiety  -sleep disturbed by neurogenic pain  21.  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  Blurring of vision, rec optho f/u, per neuro inconsistent findings on visual field testing to left , repeat CT no new infarct in RIght parietal or occipital lobe LOS: 16 days A FACE TO FACE EVALUATION WAS PERFORMED  Erick Colace 05/17/2023, 7:50 AM    7

## 2023-05-17 NOTE — Progress Notes (Addendum)
Patient ID: Brittany Mullins, female   DOB: 11-22-1980, 43 y.o.   MRN: [id number]  Per medical team, pt discharge delayed due to medical reasons. Will likely leave tomorrow.   SW received phone call from pt who was inquiring about if there was a service for her friend to be her caregiver and receive payment. SW shared unaware of Medicaid offering this service in [place] as she reports she had in another state. She has been encouraged to follow-up with Medicaid to discuss. SW shared about PCS services approved yesterday, and informed will leave PCS list in room.   Cecile Sheerer, MSW, LCSW Office: [phone] Cell: [phone] Fax: [phone]

## 2023-05-17 NOTE — Progress Notes (Signed)
Inpatient Rehabilitation Discharge Medication Review by a Pharmacist   A complete drug regimen review was completed for this patient to identify any potential clinically significant medication issues.   High Risk Drug Classes Is patient taking? Indication by Medication  Antipsychotic No   Anticoagulant No   Antibiotic No    Opioid Yes Methadone - chronic pain management  Antiplatelet Yes Aspirin - - stroke prevention  Hypoglycemics/insulin No    Vasoactive Medication Yes Amlodipine and losartan - HTN  Chemotherapy No    Other Yes Acetaminophen- pain Ariprazole-  bipolar disorder/Mood/Behavior  Atorvastatin - HLD Cyclobenzaprine - muscle spasms Gabapentin - pain management Cortef - Addison's disease Keppra, lamictal - seizures Hydroxyzine - anxiety Vienva - birth control Pantoprazole - gastritis Senna S , miralax- constipation Narcan inj-prn overdose  Phenergan - nausea and vomiting Albuterol inhlaer- for wheezing or shortness of breath Zostrix cream- Liquifilm tears ophth solution- dry eyes        Type of Medication Issue Identified Description of Issue Recommendation(s)  Drug Interaction(s) (clinically significant)        Duplicate Therapy        Allergy        No Medication Administration End Date        Incorrect Dose        Additional Drug Therapy Needed        Significant med changes from prior encounter (inform family/care partners about these prior to discharge).    Other            Clinically significant medication issues were identified that warrant physician communication and completion of prescribed/recommended actions by midnight of the next day:  No   Name of provider notified for urgent issues identified:    Provider Method of Notification:      Pharmacist comments:    Time spent performing this drug regimen review (minutes): 20    Noah Delaine, [place] Clinical Pharmacist 05/17/2023 12:42 PM

## 2023-05-17 NOTE — Discharge Summary (Signed)
 Physician Discharge Summary  Patient ID: Brittany Mullins MRN: [id number] DOB/AGE: Jul 04, 1980 43 y.o.  Admit date: 05/01/2023 Discharge date: 05/18/2023  Discharge Diagnoses:  Principal Problem:   Left pontine stroke [facility]) Active Problems:   Chronic pain disorder   Bipolar I disorder with mixed features (HCC)   Chronic pancreatitis   Left shoulder pain   Hight blood pressure    Low protein S   Insomnia   PNES   Marijuana use    Discharged Condition: stable  Significant Diagnostic Studies: CT HEAD WO CONTRAST ( ) Result Date: 05/17/2023 CLINICAL DATA:  Stroke, follow up follow up EXAM: CT HEAD WITHOUT CONTRAST TECHNIQUE: Contiguous axial images were obtained from the base of the skull through the vertex without intravenous contrast. RADIATION DOSE REDUCTION: This exam was performed according to the departmental dose-optimization program which includes automated exposure control, adjustment of the mA and/or kV according to patient size and/or use of iterative reconstruction technique. COMPARISON:  CT head May 05, 2023. FINDINGS: Brain: No evidence of new acute infarction, hemorrhage, hydrocephalus, extra-axial collection or mass lesion/mass effect. Known recent left pontine infarct without evidence of progression. Vascular: No hyperdense vessel. Skull: No acute fracture. Sinuses/Orbits: Clear sinuses.  No acute orbital findings. Other: No mastoid effusions. IMPRESSION: 1. No evidence of acute intracranial abnormality. 2. Known recent left pontine infarct without evidence of progression. Electronically Signed   By: Feliberto Harts M.D.   On: 05/17/2023 00:24   DG Swallowing Func-Speech Pathology Result Date: 05/06/2023 Table formatting from the original result was not included. Modified Barium Swallow Study Patient Details Name: Brittany Mullins MRN: [id number] Date of Birth: 02-10-1981 Today's Date: 05/06/2023 HPI/PMH: HPI: 43 year old female with history of  gastritis/duodenitis, Addison's disease, pituitary tumor, empty sella syndrome, HTN, opiate dependence on methadone, bipolar disease, THC use, PCOS, seizures presented to the ED on 04/22/23 with right sided weakness and numbness.      MRI showed left pontine infarct. CTA no LVO. No interventions due to outside of window. UDS positive THC. DVT prophylaxis with lovenox. Placed on ASA and Plavix for 3 weeks  and then ASA alone. Keppra continued as at home. Typically has 1 to 2 seizures per month but can increase to 1-2 seizures a week. Has service Dog for seizure detection. On no home meds for HTN. Placed on Norvasc and Cozaar. LDL 94 and placed on Lipitor. On methadone at home due to pain and nausea from chronic pancreatitis. Estrogen for PCOS.     Developed increased left sided weakness. Stroke team recommended repeat CT and  EEG. No evidence of definite seizures.  Keppra dose adjusted. Noted left upper extremity hypertonia flexion which had also raised concerns for seizure. Clinical Impression: Clinical Impression: Patient presents with oropharyngeal swallowing function that is within functional limits. Oral and pharyngeal clearance was timely and efficient and no significant airway invasion noted across all consistencies. Of note, patient reports globus sensation, intermittent burning/odynophagia when eating, burning sensation in nose and back of throat after waking up in the morning, and intermittent chest pain that is aleviated with eructation. Brief, nondiagnostic esophageal sweep revealed no overt esophageal clearance difficulty but recommend patient seek esophageal evaluation from GI to determine if esopahgeal pathology may underlie her discomfort during meals. Recommend continuation of regular/thin liquid diet with medications taken whole in puree per patient preference. Factors that may increase risk of adverse event in presence of aspiration Rubye Oaks & Clearance Coots 2021): No data recorded Recommendations/Plan:  Swallowing Evaluation Recommendations Swallowing Evaluation Recommendations Recommendations:  PO diet PO Diet Recommendation: Regular; Thin liquids (Level 0) Liquid Administration via: Cup; Straw Medication Administration: Whole meds with puree Supervision: Patient able to self-feed Postural changes: Position pt fully upright for meals Oral care recommendations: Oral care BID (2x/day) Recommended consults: Consider GI consultation; Consider esophageal assessment Treatment Plan Treatment Plan Treatment recommendations: No treatment recommended at this time (no treatment for Swallowing) Recommendations Comment: n/a Functional status assessment: Patient has not had a recent decline in their functional status. Recommendations Recommendations for follow up therapy are one component of a multi-disciplinary discharge planning process, led by the attending physician.  Recommendations may be updated based on patient status, additional functional criteria and insurance authorization. Assessment: Orofacial Exam: Orofacial Exam Oral Cavity: Oral Hygiene: WFL Oral Cavity - Dentition: Dentures, top Orofacial Anatomy: WFL Oral Motor/Sensory Function: Suspected cranial nerve impairment CN V - Trigeminal: Right sensory impairment CN IX - Glossopharyngeal, CN X - Vagus: Right motor impairment Anatomy: Anatomy: WFL Boluses Administered: Boluses Administered Boluses Administered: Mildly thick liquids (Level 2, nectar thick); Thin liquids (Level 0); Moderately thick liquids (Level 3, honey thick); Puree; Solid  Oral Impairment Domain: Oral Impairment Domain Lip Closure: No labial escape Tongue control during bolus hold: Cohesive bolus between tongue to palatal seal Bolus preparation/mastication: Timely and efficient chewing and mashing Bolus transport/lingual motion: Brisk tongue motion Oral residue: Trace residue lining oral structures Location of oral residue : Tongue; Palate Initiation of pharyngeal swallow : Pyriform sinuses   Pharyngeal Impairment Domain: Pharyngeal Impairment Domain Soft palate elevation: No bolus between soft palate (SP)/pharyngeal wall (PW) Laryngeal elevation: Complete superior movement of thyroid cartilage with complete approximation of arytenoids to epiglottic petiole Anterior hyoid excursion: Complete anterior movement Epiglottic movement: Partial inversion (due to abbutting against posterior pharyngeal wall) Laryngeal vestibule closure: Complete, no air/contrast in laryngeal vestibule Pharyngeal stripping wave : Present - complete Pharyngeal contraction (A/P view only): N/A Pharyngoesophageal segment opening: Complete distension and complete duration, no obstruction of flow Tongue base retraction: No contrast between tongue base and posterior pharyngeal wall (PPW) Pharyngeal residue: Trace residue within or on pharyngeal structures Location of pharyngeal residue: Valleculae  Esophageal Impairment Domain: Esophageal Impairment Domain Esophageal clearance upright position: Complete clearance, esophageal coating Pill: Pill Consistency administered: Puree Puree: WFL Penetration/Aspiration Scale Score: Penetration/Aspiration Scale Score 1.  Material does not enter airway: Thin liquids (Level 0); Mildly thick liquids (Level 2, nectar thick); Moderately thick liquids (Level 3, honey thick); Puree; Solid; Pill Compensatory Strategies: Compensatory Strategies Compensatory strategies: No   General Information: Caregiver present: No  Diet Prior to this Study: Regular; Thin liquids (Level 0)   Temperature : Normal   Respiratory Status: WFL   Supplemental O2: None (Room air)   History of Recent Intubation: No  Behavior/Cognition: Alert; Cooperative; Pleasant mood Self-Feeding Abilities: Able to self-feed Baseline vocal quality/speech: Hypophonia/low volume Volitional Cough: Able to elicit Volitional Swallow: Able to elicit Exam Limitations: Limited visibility (due to patient's "frozen shoulder") Goal Planning: No data  recorded No data recorded No data recorded No data recorded Consulted and agree with results and recommendations: Patient Pain: Pain Assessment Pain Assessment: Faces Faces Pain Scale: 4 Pain Location: Shoulders End of Session: Start Time:No data recorded Stop Time: No data recorded Time Calculation:No data recorded Charges: No data recorded SLP visit diagnosis: SLP Visit Diagnosis: Dysarthria and anarthria (R47.1) Past Medical History: Past Medical History: Diagnosis Date  Addison's disease (HCC)   Brain tumor (benign) (HCC)   Chronic pancreatitis (HCC)   Hearing loss   Hypertension  Marijuana use   Per pt: "medical marijuana patient"  Pancreatitis   PCOS (polycystic ovarian syndrome)   Pituitary tumor   Seizures (HCC)  Past Surgical History: Past Surgical History: Procedure Laterality Date  ABDOMINAL HYSTERECTOMY    APPENDECTOMY    BIOPSY  08/16/2021  Procedure: BIOPSY;  Surgeon: Shellia Cleverly, DO;  Location: [facility];  Service: Gastroenterology;;  CESAREAN SECTION    CHOLECYSTECTOMY    ELBOW SURGERY    ESOPHAGOGASTRODUODENOSCOPY (EGD) WITH PROPOFOL N/A 08/16/2021  Procedure: ESOPHAGOGASTRODUODENOSCOPY (EGD) WITH PROPOFOL;  Surgeon: Shellia Cleverly, DO;  Location: [facility];  Service: Gastroenterology;  Laterality: N/A;  kidney stent    pancreatic stent    RADIOLOGY WITH ANESTHESIA N/A 04/25/2023  Procedure: MRI of Brain without contrast;  Surgeon: Radiologist, Medication, MD;  Location: [facility] OR;  Service: Radiology;  Laterality: N/A;  RADIOLOGY WITH ANESTHESIA N/A 05/03/2023  Procedure: MRI WITH ANESTHESIA CERVICAL SPINE WITHOUT CONTRAST;  Surgeon: Radiologist, Medication, MD;  Location: [facility] OR;  Service: Radiology;  Laterality: N/A; Yetta Barre 05/06/2023, 12:17 PM  Overnight EEG with video Result Date: 05/06/2023 Charlsie Quest, MD     05/06/2023 10:30 AM Patient Name: Kiyanna Biegler MRN: [id number] Epilepsy Attending: Charlsie Quest Referring Physician/Provider: Marjorie Smolder, NP Duration: 05/05/2023 1003 to 05/06/2023 1003  Patient history: 43 year old female with a PMHx significant for seizures (on Keppra) and HTN who presents to the emergency department for evaluation of acute onset of right face, arm and leg numbness with weakness. EEG to evaluate for seizure.  Level of alertness: Awake, asleep  AEDs during EEG study: LEV, GBP  Technical aspects: This EEG study was done with scalp electrodes positioned according to the 10-20 International system of electrode placement. Electrical activity was reviewed with band pass filter of 1-70Hz , sensitivity of 7 uV/mm, display speed of 37mm/sec with a 60Hz  notched filter applied as appropriate. EEG data were recorded continuously and digitally stored.  Video monitoring was available and reviewed as appropriate.  Description: The posterior dominant rhythm consists of 9 Hz activity of moderate voltage (25-35 uV) seen predominantly in posterior head regions, symmetric and reactive to eye opening and eye closing. Sleep was characterized by vertex waves, sleep spindles (12 to 14 Hz), maximal frontocentral region. Hyperventilation and photic stimulation were not performed.   Event button was pressed on 05/05/2023 at 1200.  Patient was sitting in bed, eyes open, not responding.  Concomitant EEG before, during and after the episode showed posterior dominant rhythm.  IMPRESSION: This study is within normal limits. No seizures or epileptiform discharges were seen throughout the recording.  Event button was pressed on 05/05/2023 at 1200 during which patient was laying in bed, not responding without concomitant EEG change.  This was a nonepileptic event.  Priyanka Annabelle Harman   CT HEAD WO CONTRAST ( ) Result Date: 05/05/2023 CLINICAL DATA:  Stroke follow-up. EXAM: CT HEAD WITHOUT CONTRAST TECHNIQUE: Contiguous axial images were obtained from the base of the skull through the vertex without intravenous contrast. RADIATION DOSE REDUCTION: This exam was performed  according to the departmental dose-optimization program which includes automated exposure control, adjustment of the mA and/or kV according to patient size and/or use of iterative reconstruction technique. COMPARISON:  04/30/2023 head CT FINDINGS: Brain: No evidence of acute infarction, hemorrhage, hydrocephalus, extra-axial collection or mass lesion/mass effect. Stable low-density in the left pons. Vascular: No hyperdense vessel or unexpected calcification. Skull: Normal. Negative for fracture or focal lesion. Sinuses/Orbits: No acute finding.  IMPRESSION: No hemorrhage or progression seen at the patient's recent left pontine infarct. No new abnormality. Electronically Signed   By: Tiburcio Pea M.D.   On: 05/05/2023 06:23   EEG adult Result Date: 05/05/2023 Charlsie Quest, MD     05/05/2023 12:06 AM Patient Name: Pascha Fogal MRN: [id number] Epilepsy Attending: Charlsie Quest Referring Physician/Provider: Erick Colace, MD Date: 05/04/2023 Duration: 26.37 mins  Patient history: 43 year old female with a PMHx significant for seizures (on Keppra) and HTN who presents to the emergency department for evaluation of acute onset of right face, arm and leg numbness with weakness. EEG to evaluate for seizure.  Level of alertness: Awake  AEDs during EEG study: LEV, GBP  Technical aspects: This EEG study was done with scalp electrodes positioned according to the 10-20 International system of electrode placement. Electrical activity was reviewed with band pass filter of 1-70Hz , sensitivity of 7 uV/mm, display speed of 44mm/sec with a 60Hz  notched filter applied as appropriate. EEG data were recorded continuously and digitally stored.  Video monitoring was available and reviewed as appropriate.  Description: The posterior dominant rhythm consists of 9 Hz activity of moderate voltage (25-35 uV) seen predominantly in posterior head regions, symmetric and reactive to eye opening and eye closing.  Hyperventilation and photic stimulation were not performed.   EEG was technically difficult due to significant myogenic artifact  IMPRESSION: This technically difficult study is within normal limits. No seizures or epileptiform discharges were seen throughout the recording.  A normal interictal EEG does not exclude the diagnosis of epilepsy.  Charlsie Quest   MR CERVICAL SPINE WO CONTRAST Result Date: 05/03/2023 CLINICAL DATA:  Cervical radiculopathy, no red flags. EXAM: MRI CERVICAL SPINE WITHOUT CONTRAST TECHNIQUE: Multiplanar, multisequence MR imaging of the cervical spine was performed. No intravenous contrast was administered. COMPARISON:  CT cervical spine December 21, 2017. FINDINGS: Alignment: Cervical kyphosis centered at C5, secondary to a 3 mm anterolisthesis of C4 over C5 and trace anterolisthesis of C3 over C4, progressed compared to prior CT. Vertebrae: Diffuse decrease of the T1 signal of the visualized osseous structures. No focal marrow lesion, fracture or evidence of discitis. Cord: Normal signal and morphology. Posterior Fossa, vertebral arteries, paraspinal tissues: T2 hyperintense signal on the left side of the pons corresponding to pontine stroke seen on prior MRI of the brain. Enlarged, partial empty sella. Two mildly prominent left medial supraclavicular lymph nodes appear similar two CT performed in 2019. Disc levels: Perineural cysts are seen bilaterally from C4-5 through C7-T1, more prominent on the right side. C2-3: No spinal canal or neural foraminal stenosis. C3-4: Minimal disc bulge, mild to moderate facet degenerative changes and ligamentum flavum redundancy resulting in minimal bilateral neural foraminal narrowing. No spinal canal stenosis. C4-5: Anterolisthesis and minimal disc bulge resulting in mild spinal canal stenosis. Mild to moderate right and advanced left facet degenerative changes resulting in mild left neural foraminal narrowing. C5-6: Posterior disc osteophyte  complex asymmetric to the right causes mild mass effect on the right anterior cord surface without cord signal abnormality. Uncovertebral and facet degenerative changes contribute for mild right neural foraminal narrowing. C6-7: Small posterior disc osteophyte complex, uncovertebral and facet degenerative changes without significant spinal canal or neural foraminal stenosis. C7-T1: No spinal canal neural foraminal stenosis. IMPRESSION: 1. Anterolisthesis of C4 over C5, progressed since prior CT, facet mediated. 2. Degenerative changes of the cervical spine with mild spinal canal stenosis at C4-5 and C5-6. 3. Mild neural foraminal narrowing on  the left at C4-5 and on the right at C5-6. 4. Diffuse decrease of the T1 signal of the visualized osseous structures, which may be seen in the setting of anemia or other marrow replacing processes. Electronically Signed   By: Baldemar Lenis M.D.   On: 05/03/2023 10:13   VAS [place] LOWER EXTREMITY VENOUS (DVT) Result Date: 05/01/2023  Lower Venous DVT Study Patient Name:  NIKOLETTE REINDL [facility]  Date of Exam:   04/30/2023 Medical Rec #: [id number]                   Accession #:    [id number] Date of Birth: 07/21/1980                   Patient Gender: F Patient Age:   53 years Exam Location:  [facility] Procedure:      VAS [place] LOWER EXTREMITY VENOUS (DVT) Referring Phys: STEPHEN CHIU --------------------------------------------------------------------------------  Indications: Pain.  Risk Factors: None identified. Comparison Study: No significant changes seen since previous exam 04/23/23. Performing Technologist: Shona Simpson  Examination Guidelines: A complete evaluation includes B-mode imaging, spectral Doppler, color Doppler, and power Doppler as needed of all accessible portions of each vessel. Bilateral testing is considered an integral part of a complete examination. Limited examinations for reoccurring indications may be performed as noted. The  reflux portion of the exam is performed with the patient in reverse Trendelenburg.  +---------+---------------+---------+-----------+----------+--------------+ RIGHT    CompressibilityPhasicitySpontaneityPropertiesThrombus Aging +---------+---------------+---------+-----------+----------+--------------+ CFV      Full           Yes      Yes                                 +---------+---------------+---------+-----------+----------+--------------+ SFJ      Full                                                        +---------+---------------+---------+-----------+----------+--------------+ FV Prox  Full                                                        +---------+---------------+---------+-----------+----------+--------------+ FV Mid   Full                                                        +---------+---------------+---------+-----------+----------+--------------+ FV DistalFull                                                        +---------+---------------+---------+-----------+----------+--------------+ PFV      Full                                                        +---------+---------------+---------+-----------+----------+--------------+  POP      Full           Yes      Yes                                 +---------+---------------+---------+-----------+----------+--------------+ PTV      Full                                                        +---------+---------------+---------+-----------+----------+--------------+ PERO     Full                                                        +---------+---------------+---------+-----------+----------+--------------+   +----+---------------+---------+-----------+----------+--------------+ LEFTCompressibilityPhasicitySpontaneityPropertiesThrombus Aging +----+---------------+---------+-----------+----------+--------------+ CFV Full           Yes      Yes                                  +----+---------------+---------+-----------+----------+--------------+    Summary: RIGHT: - There is no evidence of deep vein thrombosis in the lower extremity.  - No cystic structure found in the popliteal fossa.  LEFT: - No evidence of common femoral vein obstruction.   *See table(s) above for measurements and observations. Electronically signed by Lemar Livings MD on 05/01/2023 at 8:13:12 AM.    Final      Labs:  Basic Metabolic Panel: Recent Labs  Lab 05/13/23 0532 05/16/23 0751  NA 140 136  K 4.0 3.8  CL 99 99  CO2 26 26  GLUCOSE 81 60*  BUN 20 18  CREATININE 0.61 0.77  CALCIUM 9.4 8.8*    CBC: Recent Labs  Lab 05/13/23 0532  WBC 5.5  HGB 12.4  HCT 37.4  MCV 93.0  PLT 263   .cbc CBG: Recent Labs  Lab 05/15/23 2228 05/15/23 2251  GLUCAP 122* 102*    Brief HPI:   Amantha Sklar is a 43 y.o. female with history of Addison's disease, pancreatitis with chronic N/B, seizures, PUD/duodenitis, chronic abdominal pain treated with methadone, bipolar disorder who was admitted on 04/22/2023 with reports of right-sided numbness as well as abnormal gait with left calf pain.  UDS positive for THC.  MRI brain done revealing acute paramedian left pontine infarct.  BLE Dopplers were negative for DVT.  She was noted to have slurred speech with intermittent aphasia and neurology recommended DAPT for stroke felt to be due to small vessel disease.  Hospital course has been significant for issues with recurrent nausea, headaches, orthostasis as well as left shoulder pains.    She did report increasing dysesthesias on 01/27 and CT head was repeated and negative for extension of hemorrhage.  Neurology was consulted for input due to reports of 2 staring episodes of unresponsiveness followed by stiffness of LUE and question of seizure.  Long-term EEG was negative for seizure and Keppra was increased to 750 mg 3 times daily per Dr. Pearlean Brownie.  Low protein as noted in  neurology recommended referral to hematology.  Therapy was working with patient was limited by pain  in LUE, ataxic gait, right blurry vision as well as right-sided weakness.  She was requiring min to mod assist overall.  CIR was recommended due to functional decline.   Hospital Course: Chinwe Lope [id number] was admitted to rehab 05/01/2023 for inpatient therapies to consist of PT, ST and OT at least three hours five days a week. Past admission physiatrist, therapy team and rehab RN have worked together to provide customized collaborative inpatient rehab.  She was maintained on DAPT throughout her stay and follow-up CBC shows H&H and platelets to be stable.  Her  blood pressures were monitored on TID basis and has been controlled on amlodipine and Cozaar.  She was noted to have severe dysesthesias left shoulder with weakness therefore MRI cervical spine done under sedation.  This revealed anterolisthesis C4-C5 with degenerative changes and mild spinal canal stenosis C4/5 and C5/6.  Dr. Jake Samples was consulted for input and felt that patient had chronic changes with no acute pathology.  He recommended a course of steroid Dosepak which was added and has helped with management of symptoms. Check of BMET showed lytes and renal status to be WNL.   Hospital course significant for episodes of abnormal movement LUE that lasted about 3 minutes.  No incontinence noted and she was able to follow command afterwards.  CT head was repeated and was negative for acute changes.  She was placed on long-term EEG which showed no evidence of seizures or epileptiform discharges during nonrhythmic movements of LUE.  She did report being stressed and was educated on stress relief and deep breathing techniques.  Neurology felt patient with PNES secondary to increased physical and psychological stress.  Psychiatry was consulted for input on management of bipolar disorder and recommended addition of Abilify as well as lamotrigine for  mood stabilization for daily manic episode as well as and seizure management.  Trazodone was increased to 50 mg as needed at bedtime to help manage insomnia.  Keppra was decreased to 500 mg 3 times daily and Lamictal was increased to 25 mg twice daily prior to discharge.   She did have 2 other episodes on 02/13 prior to original discharge.  During this episode she was able to speak, follow commands and was cognitively at baseline after these brief episodes.  Neurology was consulted for input and recommended ego support for PNES.   Medications were discussed with Dr. Amada Jupiter and Abilify was decreased to 5 mg daily to avoid lowering seizure threshold. She did report increase in blurry vision with left field cut on 02/14 therefore CT head repeated and was negative for acute changes.  Neurology was consulted due to concerns of left hemianopsia and this was felt to be functional in nature.  MRI brain ordered for workup however she was unable to tolerate this.  Her length of stay was extended by a day and CT head repeated on 02/15 and showed no acute changes.  She has made steady gains during his stay and was modified independent in supervised setting.  She will continue to receive follow-up home health therapy after discharge.  Rehab course: During patient's stay in rehab weekly team conferences were held to monitor patient's progress, set goals and discuss barriers to discharge. At admission, patient required mod assist with mobility and with ADL tasks. She exhibited moderate dysarthria with halting and effortful speech as well as moderate deficits in attention, memory and problem-solving. She  has had improvement in activity tolerance, balance, postural control as well as ability to compensate for deficits.  She has had improvement in functional use RUE  and RLE as well as improvement in awareness.  She was able to complete ADL tasks at modified independent level. She was independent for transfers and to  ambulate short distances in supervised setting.  She could ambulate 300 feet with use of rollator and supervision.   Discharge disposition: 06-Home-Health Care Svc  Diet: Heart healthy  Special Instructions: No driving or strenuous activity till cleared by MD. 2.  Recommend following up with [facility] for psychiatric care and medication adjustment. 3.   Discharge Instructions     Ambulatory referral to Hematology / Oncology   Complete by: As directed    Recent stroke with Low protein S. 4-6 week follow up per neurology recommendations. .   Ambulatory referral to Neurology   Complete by: As directed    An appointment is requested in approximately: 2-3 weeks with Dr. Karel Jarvis   Ambulatory referral to Physical Medicine Rehab   Complete by: As directed    Hospital follow up      Allergies as of 05/17/2023       Reactions   Bee Venom Anaphylaxis   Toradol [ketorolac Tromethamine] Shortness Of Breath   Benadryl [diphenhydramine] Other (See Comments)   States she cannot breathe   Other    CT dye        Medication List     STOP taking these medications    alum & mag hydroxide-simeth 200-200-20 MG/5ML suspension Commonly known as: MAALOX/MYLANTA   clopidogrel 75 MG tablet Commonly known as: PLAVIX   ondansetron 8 MG disintegrating tablet Commonly known as: ZOFRAN-ODT   senna 8.6 MG Tabs tablet Commonly known as: SENOKOT       TAKE these medications    acetaminophen 325 MG tablet Commonly known as: TYLENOL Take 2 tablets (650 mg total) by mouth every 6 (six) hours as needed for mild pain (pain score 1-3). What changed: reasons to take this   albuterol 108 (90 Base) MCG/ACT inhaler Commonly known as: VENTOLIN HFA Inhale 1-2 puffs into the lungs every 6 (six) hours as needed for wheezing or shortness of breath.   amLODipine 5 MG tablet Commonly known as: NORVASC Take 1 tablet (5 mg total) by mouth daily.   ARIPiprazole 5 MG tablet Commonly known as:  ABILIFY Take 1 tablet (5 mg total) by mouth daily.   aspirin EC 81 MG tablet Take 1 tablet (81 mg total) by mouth daily. Swallow whole.   atorvastatin 40 MG tablet Commonly known as: LIPITOR Take 1 tablet (40 mg total) by mouth daily.   capsicum 0.075 % topical cream Commonly known as: ZOSTRIX Apply topically 2 (two) times daily. Apply to shoulders--avoid eyes and broken skin   cyclobenzaprine 5 MG tablet Commonly known as: FLEXERIL Take 1 tablet (5 mg total) by mouth 3 (three) times daily as needed for muscle spasms.   gabapentin 300 MG capsule Commonly known as: NEURONTIN Take 1 capsule (300 mg total) by mouth 2 (two) times daily with breakfast and with lunch What changed: when to take this   gabapentin 400 MG capsule Commonly known as: NEURONTIN Take 1 capsule (400 mg total) by mouth at bedtime. What changed: You were already taking a medication with the same name, and this prescription was added. Make sure you understand how and when to take each.   hydrocortisone 5 MG tablet Commonly known as: CORTEF Take 2 tablets (10 mg total) by mouth daily with breakfast AND 1 tablet (5 mg total)  daily before supper. What changed: See the new instructions.   hydrOXYzine 10 MG tablet Commonly known as: ATARAX Take 1 tablet (10 mg total) by mouth 3 (three) times daily as needed for anxiety.   lamoTRIgine 25 MG tablet Commonly known as: LAMICTAL Take 1 tablet (25 mg total) by mouth 2 (two) times daily.   levETIRAcetam 500 MG tablet Commonly known as: KEPPRA Take 1 tablet (500 mg total) by mouth every 8 (eight) hours. What changed:  medication strength how much to take   losartan 25 MG tablet Commonly known as: COZAAR Take 1 tablet (25 mg total) by mouth daily.   methadone 10 MG/ML solution Commonly known as: DOLOPHINE Take 120 mg by mouth daily.   naloxone 0.4 MG/ML injection Commonly known as: NARCAN Inject 1 mL (0.4 mg total) into the vein as needed.   pantoprazole  40 MG tablet Commonly known as: PROTONIX Take 1 tablet (40 mg total) by mouth daily at 6 (six) AM.   polyethylene glycol 17 g packet Commonly known as: MIRALAX / GLYCOLAX Take 17 g by mouth daily as needed for mild constipation.   polyvinyl alcohol 1.4 % ophthalmic solution Commonly known as: LIQUIFILM TEARS Place 1 drop into both eyes 2 (two) times daily.   Senna-S 8.6-50 MG tablet Generic drug: senna-docusate Take 2 tablets by mouth daily at 6 (six) AM.   Vienva 0.1-20 MG-MCG tablet Generic drug: levonorgestrel-ethinyl estradiol Take 1 tablet by mouth daily.        Follow-up Information     Kirsteins, Victorino Sparrow, MD Follow up.   Specialty: Physical Medicine and Rehabilitation Why: office will call you with follow up appointment Contact information: [address] [place] [address] [phone]         [facility]. Call.   Why: today or Monday for psychiatry follow up appointment and further medication changes Contact information: [address] [place] [address] [place] [address]        Van Clines, MD Follow up.   Specialty: Neurology Why: office will call you with follow up appointment Contact information: [address] [place] [address] [phone]         Cain Saupe, MD. Call.   Specialty: Family Medicine Why: Monday for post hospital follow up Contact information: [address] [place] [address] [phone]                 Signed: Jacquelynn Cree 05/22/2023, 12:49 PM

## 2023-05-18 ENCOUNTER — Other Ambulatory Visit (HOSPITAL_COMMUNITY): Payer: Self-pay

## 2023-05-18 DIAGNOSIS — R300 Dysuria: Secondary | ICD-10-CM

## 2023-05-18 DIAGNOSIS — I10 Essential (primary) hypertension: Secondary | ICD-10-CM | POA: Diagnosis not present

## 2023-05-18 DIAGNOSIS — I639 Cerebral infarction, unspecified: Secondary | ICD-10-CM | POA: Diagnosis not present

## 2023-05-18 LAB — URINALYSIS, ROUTINE W REFLEX MICROSCOPIC
Bilirubin Urine: NEGATIVE
Glucose, UA: NEGATIVE mg/dL
Hgb urine dipstick: NEGATIVE
Ketones, ur: 5 mg/dL — AB
Leukocytes,Ua: NEGATIVE
Nitrite: NEGATIVE
Protein, ur: NEGATIVE mg/dL
Specific Gravity, Urine: 1.03 (ref 1.005–1.030)
pH: 5 (ref 5.0–8.0)

## 2023-05-18 NOTE — Progress Notes (Signed)
 PROGRESS NOTE   Subjective/Complaints:  Pt doing ok, feels good, ready go to home! Slept well, pain manageable, LBM just now, urinating ok but states she has occasional burning with urination. No other symptoms, no urgency/frequency, no hematuria, no discharge. No other complaints or concerns.   ROS: Patient denies nausea, vomiting, diarrhea, cough, shortness of breath or chest pain,       Objective:   CT HEAD WO CONTRAST ( ) Result Date: 05/17/2023 CLINICAL DATA:  Stroke, follow up EXAM: CT HEAD WITHOUT CONTRAST TECHNIQUE: Contiguous axial images were obtained from the base of the skull through the vertex without intravenous contrast. RADIATION DOSE REDUCTION: This exam was performed according to the departmental dose-optimization program which includes automated exposure control, adjustment of the mA and/or kV according to patient size and/or use of iterative reconstruction technique. COMPARISON:  CT head 05/16/2023 FINDINGS: Brain: No evidence of acute large vascular territory infarction, hemorrhage, hydrocephalus, extra-axial collection or mass lesion/mass effect. Partially empty sella. Vascular: No hyperdense vessel. Skull: No acute fracture. Sinuses/Orbits: Clear sinuses.  No acute orbital findings. Other: No mastoid effusions. IMPRESSION: 1. No evidence of acute intracranial abnormality. 2. Partially empty sella, which is often a normal anatomic variant but can be associated with idiopathic intracranial hypertension. Electronically Signed   By: Feliberto Harts M.D.   On: 05/17/2023 21:44   CT HEAD WO CONTRAST ( ) Result Date: 05/17/2023 CLINICAL DATA:  Stroke, follow up follow up EXAM: CT HEAD WITHOUT CONTRAST TECHNIQUE: Contiguous axial images were obtained from the base of the skull through the vertex without intravenous contrast. RADIATION DOSE REDUCTION: This exam was performed according to the departmental dose-optimization  program which includes automated exposure control, adjustment of the mA and/or kV according to patient size and/or use of iterative reconstruction technique. COMPARISON:  CT head May 05, 2023. FINDINGS: Brain: No evidence of new acute infarction, hemorrhage, hydrocephalus, extra-axial collection or mass lesion/mass effect. Known recent left pontine infarct without evidence of progression. Vascular: No hyperdense vessel. Skull: No acute fracture. Sinuses/Orbits: Clear sinuses.  No acute orbital findings. Other: No mastoid effusions. IMPRESSION: 1. No evidence of acute intracranial abnormality. 2. Known recent left pontine infarct without evidence of progression. Electronically Signed   By: Feliberto Harts M.D.   On: 05/17/2023 00:24    No results for input(s): "WBC", "HGB", "HCT", "PLT" in the last 72 hours.   Recent Labs    05/16/23 0751  NA 136  K 3.8  CL 99  CO2 26  GLUCOSE 60*  BUN 18  CREATININE 0.77  CALCIUM 8.8*   Urinalysis    Component Value Date/Time   COLORURINE YELLOW 05/18/2023 0843   APPEARANCEUR HAZY (A) 05/18/2023 0843   LABSPEC 1.030 05/18/2023 0843   PHURINE 5.0 05/18/2023 0843   GLUCOSEU NEGATIVE 05/18/2023 0843   HGBUR NEGATIVE 05/18/2023 0843   BILIRUBINUR NEGATIVE 05/18/2023 0843   BILIRUBINUR negative 06/12/2017 1053   KETONESUR 5 (A) 05/18/2023 0843   PROTEINUR NEGATIVE 05/18/2023 0843   UROBILINOGEN 0.2 05/18/2020 1104   NITRITE NEGATIVE 05/18/2023 0843   LEUKOCYTESUR NEGATIVE 05/18/2023 0843       Intake/Output Summary (Last 24 hours) at 05/18/2023 1101 Last data filed at 05/18/2023  4098 Gross per 24 hour  Intake 476 ml  Output --  Net 476 ml        Physical Exam: Vital Signs Blood pressure 100/67, pulse 78, temperature 99 F (37.2 C), temperature source Oral, resp. rate 18, height 5\' 2"  (1.575 m), weight 70.3 kg, SpO2 99%.   General: No acute distress sitting on EOB Mood and affect are appropriate Heart: Regular rate and rhythm no  rubs murmurs or extra sounds Lungs: Clear to auscultation, breathing unlabored, no rales or wheezes Abdomen: Positive bowel sounds, soft nontender to palpation, nondistended Extremities: No clubbing, cyanosis, or edema  Skin: No evidence of breakdown, no evidence of rash  PRIOR EXAMS: Neurologic: Cranial nerves II through XII intact, motor strength is 4- /5 in right deltoid, bicep, tricep,RIght  grip, 5/5 Left grip, 5/5 left and 4/5 right hip flexor, knee extensors, ankle dorsiflexor and plantar flexor Some pain inhibition LUE  Sensory exam hypersensitivityequal in BUE Neuro:  Eyes without evidence of nystagmus  Tone is normal without evidence of spasticity Cerebellar exam shows no evidence of ataxia on finger nose finger or heel to shin testing   Cranial nerves II- Visual fields reduced confrontation left ,  III- no evidence of ptosis, upward, downward and medial gaze intact IV- no vertical diplopia or head tilt V- no facial numbness or masseter weakness VI- no pupil abduction weakness VII- no facial droop, good lid closure VII- normal auditory acuity IX- no hoarseness X-  no hoarseness XI- no trap or SCM weakness XII- no glossal weakness   Musculoskeletal: Full range of motion in all 4 extremities. No joint swelling. Myofascial pain of shoulder girdle improved    Assessment/Plan: 1. Functional deficits which require 3+ hours per day of interdisciplinary therapy in a comprehensive inpatient rehab setting. Physiatrist is providing close team supervision and 24 hour management of active medical problems listed below. Physiatrist and rehab team continue to assess barriers to discharge/monitor patient progress toward functional and medical goals  Care Tool:  Bathing    Body parts bathed by patient: Abdomen, Chest, Front perineal area, Right upper leg, Left upper leg, Left lower leg, Face, Left arm, Right arm, Buttocks, Right lower leg   Body parts bathed by helper: Right lower  leg, Buttocks, Right arm, Left arm     Bathing assist Assist Level: Independent with assistive device     Upper Body Dressing/Undressing Upper body dressing   What is the patient wearing?: Bra, Pull over shirt    Upper body assist Assist Level: Independent    Lower Body Dressing/Undressing Lower body dressing      What is the patient wearing?: Underwear/pull up, Pants     Lower body assist Assist for lower body dressing: Independent with assitive device     Toileting Toileting    Toileting assist Assist for toileting: Independent with assistive device     Transfers Chair/bed transfer  Transfers assist     Chair/bed transfer assist level: Independent with assistive device Chair/bed transfer assistive device:  (rollator)   Locomotion Ambulation   Ambulation assist      Assist level: Supervision/Verbal cueing Assistive device: Rollator Max distance: 300   Walk 10 feet activity   Assist     Assist level: Supervision/Verbal cueing Assistive device: Rollator   Walk 50 feet activity   Assist Walk 50 feet with 2 turns activity did not occur: Safety/medical concerns  Assist level: Supervision/Verbal cueing Assistive device: Rollator    Walk 150 feet activity   Assist Walk  150 feet activity did not occur: Safety/medical concerns  Assist level: Supervision/Verbal cueing Assistive device: Rollator    Walk 10 feet on uneven surface  activity   Assist Walk 10 feet on uneven surfaces activity did not occur: Safety/medical concerns   Assist level: Supervision/Verbal cueing Assistive device: Rollator   Wheelchair     Assist Is the patient using a wheelchair?: No Type of Wheelchair: Manual    Wheelchair assist level: Supervision/Verbal cueing Max wheelchair distance: 100    Wheelchair 50 feet with 2 turns activity    Assist        Assist Level: Supervision/Verbal cueing   Wheelchair 150 feet activity     Assist       Assist Level: Maximal Assistance - Patient 25 - 49%   Blood pressure 100/67, pulse 78, temperature 99 F (37.2 C), temperature source Oral, resp. rate 18, height 5\' 2"  (1.575 m), weight 70.3 kg, SpO2 99%.  Medical Problem List and Plan: 1. Functional deficits secondary to Lft ventral pontine CVA, likely small vessel disease             -patient may shower             -ELOS/Goals: 2/14   Mod I to Sup PT/OT/SLP         -Continue CIR therapies including PT, OT, and SLP  Per neuro repeat CT in am , if no new infarct can d/c home   -05/18/23 CTs fine, no new infarct, stable for d/c, reviewed meds 2.  Antithrombotics: -DVT/anticoagulation:  Pharmaceutical: Lovenox             -antiplatelet therapy: DAPT X 3 weeks followed by ASA alone.  3. Pain Management: On Methadone (followed by [facility]).             --now on prn oxycodone due to shoulder/arm pain reduced to 1 tab oxy IR 5, pt asking for 2 tabs, discussed concerns for sedation ( also hx OUD)             --gabapentin TID for neuropathy, increase qh dose  Slept ok but more burning RLE this am  Change gabapentin to 300mg  BID and 400mg  at bedtime  4. H/o bipolar d/o/Mood/Behavior/Sleep: LCSW to follow for evaluation and support.              -2/3 psych recommended abilify 7.5mg  q am and lamictal 25mg  bid   -2/12- per psych can titrate Lamictal q 2wks, so too early , can titrate abilify, increased to 10mg  today    Asking for anxiety med states the Ativan IV calmed her down, discussed that ativan does not interact well with methadone,  May use hydroxyzine  Reinforce self management techniques              - 5. Neuropsych/cognition: This patient is capable of making decisions on her own behalf. 6. Skin/Wound Care: Routine pressure relief measures. 7. Fluids/Electrolytes/Nutrition: Monitor I/O.    2/10 potassium 4.0, continue daily supplement      Latest Ref Rng & Units 05/16/2023    7:51 AM 05/13/2023    5:32 AM 05/09/2023    5:48 AM   BMP  Glucose 70 - 99 mg/dL 60  81  71   BUN 6 - 20 mg/dL 18  20  18    Creatinine 0.44 - 1.00 mg/dL 4.78  2.95  6.21   Sodium 135 - 145 mmol/L 136  140  137   Potassium 3.5 - 5.1 mmol/L 3.8  4.0  4.0   Chloride 98 - 111 mmol/L 99  99  99   CO2 22 - 32 mmol/L 26  26  27    Calcium 8.9 - 10.3 mg/dL 8.8  9.4  8.9     8. HTN: Monitor BP TID--continue Cozaar and Norvasc.    -05/18/23 BPs stable, cont outpatient management 9. 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 Addison's disease: Now on Cortef 10 mg am/5 mg pm 11. Chronic pancreatitis: Has chronic issues with nausea --continue phenergan --continue PPI Vomiting yesterday abd exam normal  BM this am  Possibility of new meds causing nausea  13. Constipation: Reports rectal bleeding-->colace added to senna due to hx of hems.    -05/18/23 LBM this morning 14. Left arm shoulder/elbow pain             -Xray elbow/shoulder 1/28 negative Suspect myofascial pain +/- frozen shoulder - improved shoulder ROM, still with upper trap trigger point -   2/3 pt received trigger point injection yesterday. ?improvement--was focused on other issues today Has pec and trap soreness with increased walker use , MSK chest pain trat symptomatically  15. Marijuana use             -Advise cessation, associated with increased stroke risk 26-44% depending on study   -2/3 uds + yesterday for thc, benzos. Urine drug profile sent to [facility], pending  -f/u UDS c/w methadone use, THC 107 16. Hx of pituitary tumor             -f/u Neurosurgery 17. Low protein S             -f/u Hematology outpatient, will need retesting in ~55mo  18.  Hx sensorineural hearing loss with resultant dysarthria  19.  LUE numbness C5 distribution shoulder pain , ? Proximal weakness vs pain inhibition , only mild C4-5 stenosis on MRI  , MRI reviewed with Neuroradiology , no spinal cord signal abnormalities Neurosurg, Dr  Dawley looked at MRI , C spine looks stable  Still has subjective numbess in left shoulder   20.  Insomnia- likely multifactorial, anxiety regarding medical condition, underlying anxiety d/o and on higher dose steroid taper - expect some improvement with steroid taper ,  - hydroxyzine for anxiety  -sleep disturbed by neurogenic pain  21.  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  Blurring of vision, rec optho f/u, per neuro inconsistent findings on visual field testing to left , repeat CT no new infarct in RIght parietal or occipital lobe 23. Dysuria: states she had this for 2 days, but no mention of this in notes. No other associated symptoms to indicate UTI, U/A completed and aside from small ketones, no other findings, no leuks/nitrites; doubt UTI, suspect just marginally dehydrated causing concentrated urine. Advised adequate hydration. Monitor outpatient, f/up with PCP if this persists.   I spent >46mins performing patient care related activities, including face to face time, documentation time, d/c review, discussion of discharge plan with patient and nursing staff, and overall coordination of care.   LOS: 17 days A FACE TO FACE EVALUATION WAS PERFORMED  [address] 05/18/2023, 11:01 AM    7

## 2023-05-19 ENCOUNTER — Emergency Department (HOSPITAL_COMMUNITY): Payer: Medicare Other

## 2023-05-19 ENCOUNTER — Other Ambulatory Visit: Payer: Self-pay

## 2023-05-19 ENCOUNTER — Inpatient Hospital Stay (HOSPITAL_COMMUNITY)
Admission: EM | Admit: 2023-05-19 | Discharge: 2023-05-22 | DRG: 871 | Disposition: A | Discharge status: Home Health | Payer: Medicare Other | Attending: Internal Medicine | Admitting: Internal Medicine

## 2023-05-19 DIAGNOSIS — G894 Chronic pain syndrome: Secondary | ICD-10-CM | POA: Diagnosis present

## 2023-05-19 DIAGNOSIS — G40909 Epilepsy, unspecified, not intractable, without status epilepticus: Secondary | ICD-10-CM | POA: Diagnosis present

## 2023-05-19 DIAGNOSIS — E872 Acidosis, unspecified: Secondary | ICD-10-CM | POA: Diagnosis present

## 2023-05-19 DIAGNOSIS — Z885 Allergy status to narcotic agent status: Secondary | ICD-10-CM

## 2023-05-19 DIAGNOSIS — Z888 Allergy status to other drugs, medicaments and biological substances status: Secondary | ICD-10-CM

## 2023-05-19 DIAGNOSIS — R569 Unspecified convulsions: Secondary | ICD-10-CM

## 2023-05-19 DIAGNOSIS — F3160 Bipolar disorder, current episode mixed, unspecified: Secondary | ICD-10-CM | POA: Diagnosis present

## 2023-05-19 DIAGNOSIS — E271 Primary adrenocortical insufficiency: Secondary | ICD-10-CM | POA: Diagnosis present

## 2023-05-19 DIAGNOSIS — Z86011 Personal history of benign neoplasm of the brain: Secondary | ICD-10-CM

## 2023-05-19 DIAGNOSIS — K8681 Exocrine pancreatic insufficiency: Secondary | ICD-10-CM | POA: Diagnosis present

## 2023-05-19 DIAGNOSIS — Z1152 Encounter for screening for COVID-19: Secondary | ICD-10-CM

## 2023-05-19 DIAGNOSIS — Z8659 Personal history of other mental and behavioral disorders: Secondary | ICD-10-CM

## 2023-05-19 DIAGNOSIS — Z7982 Long term (current) use of aspirin: Secondary | ICD-10-CM

## 2023-05-19 DIAGNOSIS — R6510 Systemic inflammatory response syndrome (SIRS) of non-infectious origin without acute organ dysfunction: Secondary | ICD-10-CM | POA: Diagnosis not present

## 2023-05-19 DIAGNOSIS — Z8673 Personal history of transient ischemic attack (TIA), and cerebral infarction without residual deficits: Secondary | ICD-10-CM

## 2023-05-19 DIAGNOSIS — Z8639 Personal history of other endocrine, nutritional and metabolic disease: Secondary | ICD-10-CM

## 2023-05-19 DIAGNOSIS — Z91030 Bee allergy status: Secondary | ICD-10-CM

## 2023-05-19 DIAGNOSIS — Z9049 Acquired absence of other specified parts of digestive tract: Secondary | ICD-10-CM

## 2023-05-19 DIAGNOSIS — K861 Other chronic pancreatitis: Secondary | ICD-10-CM | POA: Diagnosis present

## 2023-05-19 DIAGNOSIS — Z833 Family history of diabetes mellitus: Secondary | ICD-10-CM

## 2023-05-19 DIAGNOSIS — Z7952 Long term (current) use of systemic steroids: Secondary | ICD-10-CM

## 2023-05-19 DIAGNOSIS — R1310 Dysphagia, unspecified: Secondary | ICD-10-CM

## 2023-05-19 DIAGNOSIS — Z90710 Acquired absence of both cervix and uterus: Secondary | ICD-10-CM

## 2023-05-19 DIAGNOSIS — D689 Coagulation defect, unspecified: Secondary | ICD-10-CM

## 2023-05-19 DIAGNOSIS — I639 Cerebral infarction, unspecified: Secondary | ICD-10-CM | POA: Diagnosis present

## 2023-05-19 DIAGNOSIS — F1290 Cannabis use, unspecified, uncomplicated: Secondary | ICD-10-CM | POA: Diagnosis present

## 2023-05-19 DIAGNOSIS — F319 Bipolar disorder, unspecified: Secondary | ICD-10-CM | POA: Diagnosis present

## 2023-05-19 DIAGNOSIS — H538 Other visual disturbances: Secondary | ICD-10-CM

## 2023-05-19 DIAGNOSIS — R32 Unspecified urinary incontinence: Secondary | ICD-10-CM | POA: Diagnosis present

## 2023-05-19 DIAGNOSIS — Z91041 Radiographic dye allergy status: Secondary | ICD-10-CM

## 2023-05-19 DIAGNOSIS — A419 Sepsis, unspecified organism: Principal | ICD-10-CM | POA: Diagnosis present

## 2023-05-19 DIAGNOSIS — H9190 Unspecified hearing loss, unspecified ear: Secondary | ICD-10-CM | POA: Diagnosis present

## 2023-05-19 DIAGNOSIS — N200 Calculus of kidney: Secondary | ICD-10-CM

## 2023-05-19 DIAGNOSIS — J188 Other pneumonia, unspecified organism: Secondary | ICD-10-CM | POA: Diagnosis present

## 2023-05-19 DIAGNOSIS — E876 Hypokalemia: Secondary | ICD-10-CM | POA: Diagnosis present

## 2023-05-19 DIAGNOSIS — I10 Essential (primary) hypertension: Secondary | ICD-10-CM | POA: Diagnosis present

## 2023-05-19 DIAGNOSIS — Z79899 Other long term (current) drug therapy: Secondary | ICD-10-CM

## 2023-05-19 DIAGNOSIS — R112 Nausea with vomiting, unspecified: Principal | ICD-10-CM | POA: Diagnosis present

## 2023-05-19 DIAGNOSIS — A084 Viral intestinal infection, unspecified: Secondary | ICD-10-CM | POA: Diagnosis present

## 2023-05-19 DIAGNOSIS — Z8249 Family history of ischemic heart disease and other diseases of the circulatory system: Secondary | ICD-10-CM

## 2023-05-19 LAB — URINALYSIS, W/ REFLEX TO CULTURE (INFECTION SUSPECTED)
Bilirubin Urine: NEGATIVE
Glucose, UA: NEGATIVE mg/dL
Hgb urine dipstick: NEGATIVE
Ketones, ur: NEGATIVE mg/dL
Leukocytes,Ua: NEGATIVE
Nitrite: NEGATIVE
Protein, ur: NEGATIVE mg/dL
Specific Gravity, Urine: 1.012 (ref 1.005–1.030)
pH: 7 (ref 5.0–8.0)

## 2023-05-19 LAB — RESPIRATORY PANEL BY PCR

## 2023-05-19 LAB — RESP PANEL BY RT-PCR (RSV, FLU A&B, COVID)  RVPGX2
Influenza A by PCR: NEGATIVE
Influenza B by PCR: NEGATIVE
Resp Syncytial Virus by PCR: NEGATIVE
SARS Coronavirus 2 by RT PCR: NEGATIVE

## 2023-05-19 LAB — COMPREHENSIVE METABOLIC PANEL
ALT: 23 U/L (ref 0–44)
AST: 31 U/L (ref 15–41)
Albumin: 3.6 g/dL (ref 3.5–5.0)
Alkaline Phosphatase: 37 U/L — ABNORMAL LOW (ref 38–126)
Anion gap: 16 — ABNORMAL HIGH (ref 5–15)
BUN: 15 mg/dL (ref 6–20)
CO2: 21 mmol/L — ABNORMAL LOW (ref 22–32)
Calcium: 8.7 mg/dL — ABNORMAL LOW (ref 8.9–10.3)
Chloride: 99 mmol/L (ref 98–111)
Creatinine, Ser: 0.77 mg/dL (ref 0.44–1.00)
GFR, Estimated: >60 mL/min (ref >60)
Glucose, Bld: 109 mg/dL — ABNORMAL HIGH (ref 70–99)
Potassium: 3.1 mmol/L — ABNORMAL LOW (ref 3.5–5.1)
Sodium: 136 mmol/L (ref 135–145)
Total Bilirubin: 1 mg/dL (ref 0.0–1.2)
Total Protein: 6.6 g/dL (ref 6.5–8.1)

## 2023-05-19 LAB — RAPID URINE DRUG SCREEN, HOSP PERFORMED
Amphetamines: NOT DETECTED
Barbiturates: NOT DETECTED
Benzodiazepines: NOT DETECTED
Cocaine: NOT DETECTED
Opiates: NOT DETECTED
Tetrahydrocannabinol: POSITIVE — AB

## 2023-05-19 LAB — CBC WITH DIFFERENTIAL/PLATELET
Abs Immature Granulocytes: 0.01 10*3/uL (ref 0.00–0.07)
Basophils Absolute: 0 10*3/uL (ref 0.0–0.1)
Basophils Relative: 0 %
Eosinophils Absolute: 0 10*3/uL (ref 0.0–0.5)
Eosinophils Relative: 1 %
HCT: 42.7 % (ref 36.0–46.0)
Hemoglobin: 14.5 g/dL (ref 12.0–15.0)
Immature Granulocytes: 0 %
Lymphocytes Relative: 10 %
Lymphs Abs: 0.6 10*3/uL — ABNORMAL LOW (ref 0.7–4.0)
MCH: 30.9 pg (ref 26.0–34.0)
MCHC: 34 g/dL (ref 30.0–36.0)
MCV: 91 fL (ref 80.0–100.0)
Monocytes Absolute: 0.4 10*3/uL (ref 0.1–1.0)
Monocytes Relative: 7 %
Neutro Abs: 4.9 10*3/uL (ref 1.7–7.7)
Neutrophils Relative %: 82 %
Platelets: 228 10*3/uL (ref 150–400)
RBC: 4.69 MIL/uL (ref 3.87–5.11)
RDW: 12.4 % (ref 11.5–15.5)
WBC: 6 10*3/uL (ref 4.0–10.5)
nRBC: 0 % (ref 0.0–0.2)

## 2023-05-19 LAB — LIPASE, BLOOD: Lipase: 23 U/L (ref 11–51)

## 2023-05-19 LAB — LACTIC ACID, PLASMA
Lactic Acid, Venous: 3 mmol/L (ref 0.5–1.9)
Lactic Acid, Venous: 2.8 mmol/L (ref 0.5–1.9)

## 2023-05-19 LAB — I-STAT CG4 LACTIC ACID, ED
Lactic Acid, Venous: 2.7 mmol/L (ref 0.5–1.9)
Lactic Acid, Venous: 2.2 mmol/L (ref 0.5–1.9)

## 2023-05-19 LAB — PROTIME-INR
INR: 1 (ref 0.8–1.2)
Prothrombin Time: 13.1 s (ref 11.4–15.2)

## 2023-05-19 LAB — PROCALCITONIN: Procalcitonin: 6.25 ng/mL

## 2023-05-19 LAB — MAGNESIUM: Magnesium: 1.3 mg/dL — ABNORMAL LOW (ref 1.7–2.4)

## 2023-05-19 LAB — TSH: TSH: 0.982 u[IU]/mL (ref 0.350–4.500)

## 2023-05-19 MED ORDER — LACTATED RINGERS IV SOLN: INTRAVENOUS | Status: AC

## 2023-05-19 MED ORDER — VANCOMYCIN HCL 1500 MG/300ML IV SOLN
1500.0000 mg | INTRAVENOUS | Status: DC
  Administered 2023-05-20: 1500 mg via INTRAVENOUS
  Filled 2023-05-19 (×2): qty 300

## 2023-05-19 MED ORDER — TRAZODONE HCL 50 MG PO TABS
50.0000 mg | ORAL_TABLET | Freq: Every evening | ORAL | Status: DC | PRN
  Administered 2023-05-20 – 2023-05-21 (×2): 50 mg via ORAL
  Filled 2023-05-19 (×2): qty 1

## 2023-05-19 MED ORDER — HYDROCORTISONE SOD SUC (PF) 100 MG IJ SOLR
100.0000 mg | Freq: Once | INTRAMUSCULAR | Status: AC
  Administered 2023-05-19: 100 mg via INTRAVENOUS
  Filled 2023-05-19: qty 2

## 2023-05-19 MED ORDER — ENOXAPARIN SODIUM 40 MG/0.4ML IJ SOSY
40.0000 mg | PREFILLED_SYRINGE | INTRAMUSCULAR | Status: DC
  Administered 2023-05-19 – 2023-05-21 (×3): 40 mg via SUBCUTANEOUS
  Filled 2023-05-19 (×3): qty 0.4

## 2023-05-19 MED ORDER — SODIUM CHLORIDE 0.9 % IV SOLN
2.0000 g | Freq: Once | INTRAVENOUS | Status: AC
  Administered 2023-05-19: 2 g via INTRAVENOUS
  Filled 2023-05-19: qty 12.5

## 2023-05-19 MED ORDER — ONDANSETRON HCL 4 MG/2ML IJ SOLN
4.0000 mg | Freq: Four times a day (QID) | INTRAMUSCULAR | Status: DC | PRN
  Administered 2023-05-19 – 2023-05-21 (×3): 4 mg via INTRAVENOUS
  Filled 2023-05-19 (×3): qty 2

## 2023-05-19 MED ORDER — SODIUM CHLORIDE 0.9 % IV SOLN
25.0000 mg | Freq: Four times a day (QID) | INTRAVENOUS | Status: DC | PRN
Start: 2023-05-19 — End: 2023-05-19
  Administered 2023-05-19: 25 mg via INTRAVENOUS
  Filled 2023-05-19 (×2): qty 1

## 2023-05-19 MED ORDER — LEVONORGESTREL-ETHINYL ESTRAD 0.1-20 MG-MCG PO TABS: 1.0000 | ORAL_TABLET | Freq: Every day | ORAL | Status: DC

## 2023-05-19 MED ORDER — ALBUTEROL SULFATE (2.5 MG/3ML) 0.083% IN NEBU: 2.5000 mg | INHALATION_SOLUTION | Freq: Four times a day (QID) | RESPIRATORY_TRACT | Status: DC | PRN

## 2023-05-19 MED ORDER — LAMOTRIGINE 25 MG PO TABS
25.0000 mg | ORAL_TABLET | Freq: Two times a day (BID) | ORAL | Status: DC
  Administered 2023-05-19 – 2023-05-22 (×6): 25 mg via ORAL
  Filled 2023-05-19 (×7): qty 1

## 2023-05-19 MED ORDER — METHADONE HCL 10 MG PO TABS
120.0000 mg | ORAL_TABLET | Freq: Every day | ORAL | Status: DC
  Administered 2023-05-19 – 2023-05-22 (×4): 120 mg via ORAL
  Filled 2023-05-19 (×4): qty 12

## 2023-05-19 MED ORDER — ACETAMINOPHEN 650 MG RE SUPP: 650.0000 mg | Freq: Four times a day (QID) | RECTAL | Status: DC | PRN

## 2023-05-19 MED ORDER — ASPIRIN 81 MG PO TBEC
81.0000 mg | DELAYED_RELEASE_TABLET | Freq: Every day | ORAL | Status: DC
  Administered 2023-05-19 – 2023-05-22 (×4): 81 mg via ORAL
  Filled 2023-05-19 (×4): qty 1

## 2023-05-19 MED ORDER — ARIPIPRAZOLE 10 MG PO TABS: 5.0000 mg | ORAL_TABLET | Freq: Every day | ORAL | Status: DC

## 2023-05-19 MED ORDER — ATORVASTATIN CALCIUM 40 MG PO TABS
40.0000 mg | ORAL_TABLET | Freq: Every day | ORAL | Status: DC
Start: 2023-05-19 — End: 2023-05-22
  Administered 2023-05-19 – 2023-05-22 (×4): 40 mg via ORAL
  Filled 2023-05-19 (×4): qty 1

## 2023-05-19 MED ORDER — ALBUTEROL SULFATE HFA 108 (90 BASE) MCG/ACT IN AERS: 1.0000 | INHALATION_SPRAY | Freq: Four times a day (QID) | RESPIRATORY_TRACT | Status: DC | PRN

## 2023-05-19 MED ORDER — HYDROCORTISONE 10 MG PO TABS
10.0000 mg | ORAL_TABLET | Freq: Every day | ORAL | Status: DC
  Administered 2023-05-20: 10 mg via ORAL
  Filled 2023-05-19 (×2): qty 1

## 2023-05-19 MED ORDER — LACTATED RINGERS IV BOLUS
1000.0000 mL | Freq: Once | INTRAVENOUS | Status: AC
  Administered 2023-05-19: 1000 mL via INTRAVENOUS

## 2023-05-19 MED ORDER — ACETAMINOPHEN 325 MG PO TABS
650.0000 mg | ORAL_TABLET | Freq: Four times a day (QID) | ORAL | Status: DC | PRN
  Administered 2023-05-20 – 2023-05-21 (×2): 650 mg via ORAL
  Filled 2023-05-19 (×3): qty 2

## 2023-05-19 MED ORDER — AMLODIPINE BESYLATE 5 MG PO TABS: 5.0000 mg | ORAL_TABLET | Freq: Every day | ORAL | Status: DC

## 2023-05-19 MED ORDER — ARIPIPRAZOLE 5 MG PO TABS
5.0000 mg | ORAL_TABLET | Freq: Every day | ORAL | Status: DC
  Administered 2023-05-19 – 2023-05-22 (×4): 5 mg via ORAL
  Filled 2023-05-19 (×4): qty 1

## 2023-05-19 MED ORDER — SODIUM CHLORIDE 0.9 % IV SOLN
2.0000 g | Freq: Three times a day (TID) | INTRAVENOUS | Status: DC
  Administered 2023-05-19 – 2023-05-22 (×8): 2 g via INTRAVENOUS
  Filled 2023-05-19 (×8): qty 12.5

## 2023-05-19 MED ORDER — PANTOPRAZOLE SODIUM 40 MG IV SOLR
40.0000 mg | INTRAVENOUS | Status: DC
  Administered 2023-05-19 – 2023-05-21 (×3): 40 mg via INTRAVENOUS
  Filled 2023-05-19 (×3): qty 10

## 2023-05-19 MED ORDER — LOSARTAN POTASSIUM 50 MG PO TABS
25.0000 mg | ORAL_TABLET | Freq: Every day | ORAL | Status: DC
  Administered 2023-05-19: 25 mg via ORAL
  Filled 2023-05-19: qty 1

## 2023-05-19 MED ORDER — MAGNESIUM SULFATE 2 GM/50ML IV SOLN
2.0000 g | Freq: Once | INTRAVENOUS | Status: AC
  Administered 2023-05-19: 2 g via INTRAVENOUS
  Filled 2023-05-19: qty 50

## 2023-05-19 MED ORDER — AMLODIPINE BESYLATE 5 MG PO TABS
5.0000 mg | ORAL_TABLET | Freq: Every day | ORAL | Status: DC
  Administered 2023-05-19: 5 mg via ORAL
  Filled 2023-05-19: qty 1

## 2023-05-19 MED ORDER — METOPROLOL TARTRATE 5 MG/5ML IV SOLN: 5.0000 mg | Freq: Four times a day (QID) | INTRAVENOUS | Status: DC | PRN

## 2023-05-19 MED ORDER — GABAPENTIN 300 MG PO CAPS
300.0000 mg | ORAL_CAPSULE | Freq: Two times a day (BID) | ORAL | Status: DC
  Administered 2023-05-20 – 2023-05-22 (×5): 300 mg via ORAL
  Filled 2023-05-19 (×5): qty 1

## 2023-05-19 MED ORDER — METRONIDAZOLE 500 MG/100ML IV SOLN
500.0000 mg | Freq: Once | INTRAVENOUS | Status: AC
  Administered 2023-05-19: 500 mg via INTRAVENOUS
  Filled 2023-05-19: qty 100

## 2023-05-19 MED ORDER — MORPHINE SULFATE (PF) 2 MG/ML IV SOLN
2.0000 mg | INTRAVENOUS | Status: DC | PRN
  Administered 2023-05-19: 2 mg via INTRAVENOUS
  Filled 2023-05-19: qty 1

## 2023-05-19 MED ORDER — LACTATED RINGERS IV BOLUS (SEPSIS)
1000.0000 mL | Freq: Once | INTRAVENOUS | Status: AC
  Administered 2023-05-19: 1000 mL via INTRAVENOUS

## 2023-05-19 MED ORDER — GABAPENTIN 400 MG PO CAPS
400.0000 mg | ORAL_CAPSULE | Freq: Every day | ORAL | Status: DC
  Administered 2023-05-19 – 2023-05-20 (×2): 400 mg via ORAL
  Filled 2023-05-19 (×2): qty 1

## 2023-05-19 MED ORDER — ACETAMINOPHEN 500 MG PO TABS: 1000.0000 mg | ORAL_TABLET | Freq: Once | ORAL | Status: DC

## 2023-05-19 MED ORDER — POLYVINYL ALCOHOL 1.4 % OP SOLN
1.0000 [drp] | Freq: Two times a day (BID) | OPHTHALMIC | Status: DC
Start: 2023-05-19 — End: 2023-05-22
  Filled 2023-05-19 (×2): qty 15

## 2023-05-19 MED ORDER — HYDROCORTISONE 5 MG PO TABS
5.0000 mg | ORAL_TABLET | Freq: Every day | ORAL | Status: DC
  Administered 2023-05-19 – 2023-05-20 (×2): 5 mg via ORAL
  Filled 2023-05-19 (×2): qty 1

## 2023-05-19 MED ORDER — POTASSIUM CHLORIDE 10 MEQ/100ML IV SOLN
10.0000 meq | INTRAVENOUS | Status: AC
  Administered 2023-05-19 (×3): 10 meq via INTRAVENOUS
  Filled 2023-05-19 (×3): qty 100

## 2023-05-19 MED ORDER — LOSARTAN POTASSIUM 50 MG PO TABS: 25.0000 mg | ORAL_TABLET | Freq: Every day | ORAL | Status: DC

## 2023-05-19 MED ORDER — VANCOMYCIN HCL 1500 MG/300ML IV SOLN
1500.0000 mg | Freq: Once | INTRAVENOUS | Status: AC
  Administered 2023-05-19: 1500 mg via INTRAVENOUS
  Filled 2023-05-19: qty 300

## 2023-05-19 MED ORDER — HYDROXYZINE HCL 10 MG PO TABS: 10.0000 mg | ORAL_TABLET | Freq: Three times a day (TID) | ORAL | Status: DC | PRN

## 2023-05-19 MED ORDER — LEVETIRACETAM 500 MG PO TABS
500.0000 mg | ORAL_TABLET | Freq: Three times a day (TID) | ORAL | Status: DC
  Administered 2023-05-19 – 2023-05-21 (×6): 500 mg via ORAL
  Filled 2023-05-19 (×6): qty 1

## 2023-05-19 MED ORDER — CYCLOBENZAPRINE HCL 5 MG PO TABS
5.0000 mg | ORAL_TABLET | Freq: Three times a day (TID) | ORAL | Status: DC | PRN
Start: 2023-05-19 — End: 2023-05-21
  Administered 2023-05-21: 5 mg via ORAL
  Filled 2023-05-19: qty 1

## 2023-05-19 MED ORDER — ACETAMINOPHEN 650 MG RE SUPP
650.0000 mg | Freq: Once | RECTAL | Status: AC
  Administered 2023-05-19: 650 mg via RECTAL
  Filled 2023-05-19: qty 1

## 2023-05-19 MED ORDER — FAMOTIDINE IN NACL 20-0.9 MG/50ML-% IV SOLN
20.0000 mg | Freq: Once | INTRAVENOUS | Status: AC
  Administered 2023-05-19: 20 mg via INTRAVENOUS
  Filled 2023-05-19: qty 50

## 2023-05-19 NOTE — Assessment & Plan Note (Addendum)
 History of chronic  nausea/vomiting, but states she typically only has 2 episodes/day and this is much worse History of chronic abdominal pain/pancreatitis. Lipase pending. She states this is worse, no CT findings of any acute process  ? Viral gastroenteritis, stool study pending. RvP pending as well  Continue anti-emetics, IVF S/p cholecystectomy, CT abdomen/pelvis with no acute findings  Clear liquid diet  Continue home methadone, PRN IV morphine for breakthrough pain

## 2023-05-19 NOTE — Assessment & Plan Note (Signed)
 States started after she had a stroke a few night ago Evaluated by neurology on 2/14 with normal repeat CTH She blinks with threat to eye and exam normal, thought functional  When more alert would do exam and consider optho consult

## 2023-05-19 NOTE — Assessment & Plan Note (Addendum)
 Last had  methadone on 2/15 Will continue home medication  With acute pain will add on some PRN IV morphine

## 2023-05-19 NOTE — ED Notes (Signed)
 [name] requesting a call from nurse wanted to express a few concerns about Eudell not being able to swallow good if eats needs to be seating up and going through a lot experiences bi-polar. Call back# [phone]

## 2023-05-19 NOTE — ED Provider Notes (Signed)
 Physical Exam  BP (!) 180/94   Pulse (!) 113   Temp (!) 102.7 F (39.3 C) (Oral)   Resp 18   Ht 5\' 2"  (1.575 m)   Wt 70.3 kg   SpO2 94%   BMI 28.35 kg/m   Physical Exam Vitals and nursing note reviewed.  Constitutional:      Comments: Chronically ill appearing  HENT:     Mouth/Throat:     Mouth: Mucous membranes are moist.  Cardiovascular:     Rate and Rhythm: Tachycardia present.  Pulmonary:     Effort: Pulmonary effort is normal. No respiratory distress.  Abdominal:     Palpations: Abdomen is soft.     Tenderness: There is abdominal tenderness. There is no guarding or rebound.     Comments: Diffuse abdominal tenderness without guarding or rebound. Soft.   Skin:    General: Skin is warm.     Procedures  Procedures  ED Course / MDM    Medical Decision Making Amount and/or Complexity of Data Reviewed Labs: ordered. Radiology: ordered.  Risk OTC drugs. Prescription drug management. Decision regarding hospitalization.   Accepted handoff at shift change from [facility], [place]. Please see prior provider note for more detail.   Briefly: Patient is 43 y.o. F with multiple co-morbidities presents to the ER for abdominal pain, NVD that happended shortly prior to arrival. She was just discharged from a SNF yesterday. She reported that multiple other people in the facility had similar symptoms. She had reports some dysuria.  DDX: concern for Sepsis, currently meeting SIRS criteria.  Plan: follow up on labs.   Urinalysis resulted with rare bacteria but no white cells or leukocytes present.  Otherwise unremarkable.  Her respiratory panel was negative.  CBC without leukocytosis.  UDS is positive for [facility] which is surprising given that she was receiving opiates while inpatient just yesterday.  CMP shows decrease potassium 3.1.  Bicarb at 21 with a anion gap of 16.  Likely due to nausea vomiting diarrhea.  Her lactic acid was improving from 2.9-2.2.  She has received at least  3 L of fluid.  Lactic acid is now up trended again to 3.0.  Lipase within normal limits.  Given unknown source of infection, I did order CT of chest abdomen pelvis.  This was done without contrast given patient's anaphylaxis reaction.  Broad-spectrum antibiotics were ordered as well with cefepime and Flagyl.  CT imaging shows 1. There are multiple new small groupings of peribronchovascular solid and ground-glass opacities throughout bilateral lungs, as described above. Findings are nonspecific and differential diagnosis includes multilobar pneumonia, aspiration, atypical pneumonia, cryptogenic organizing pneumonia, etc. Correlate clinically. 2. No acute inflammatory process identified within the abdomen or pelvis. 3. There are bilateral, sub 3 mm, nonobstructing renal calculi. No ureterolithiasis or obstructive uropathy. 4. Multiple other nonacute observations, as described above. Per radiologist's interpretation.    Given this multifocal pneumonia potential as well as patient just leaving hospital setting, I did add on vancomycin for possible hospital acquired pneumonia.  Interestingly, the patient does not have any cough or cold symptoms.  She did come in febrile however.  I have also ordered on a respiratory panel as well to see if this is any other issue.  She has not had any diarrhea since being here however there was a GI PCR and C. difficile panel ordered.  Given patient's fever, tachycardia, and other symptoms, patient needs to be admitted for further workup.   Results for orders placed or  performed during the hospital encounter of 05/19/23  Comprehensive metabolic panel   Collection Time: 05/19/23  5:35 AM  Result Value Ref Range   Sodium 136 135 - 145 mmol/L   Potassium 3.1 (L) 3.5 - 5.1 mmol/L   Chloride 99 98 - 111 mmol/L   CO2 21 (L) 22 - 32 mmol/L   Glucose, Bld 109 (H) 70 - 99 mg/dL   BUN 15 6 - 20 mg/dL   Creatinine, Ser 1.61 0.44 - 1.00 mg/dL   Calcium 8.7 (L) 8.9 - 10.3 mg/dL    Total Protein 6.6 6.5 - 8.1 g/dL   Albumin 3.6 3.5 - 5.0 g/dL   AST 31 15 - 41 U/L   ALT 23 0 - 44 U/L   Alkaline Phosphatase 37 (L) 38 - 126 U/L   Total Bilirubin 1.0 0.0 - 1.2 mg/dL   GFR, Estimated >09 >60 mL/min   Anion gap 16 (H) 5 - 15  CBC with Differential   Collection Time: 05/19/23  5:35 AM  Result Value Ref Range   WBC 6.0 4.0 - 10.5 K/uL   RBC 4.69 3.87 - 5.11 MIL/uL   Hemoglobin 14.5 12.0 - 15.0 g/dL   HCT 45.4 09.8 - 11.9 %   MCV 91.0 80.0 - 100.0 fL   MCH 30.9 26.0 - 34.0 pg   MCHC 34.0 30.0 - 36.0 g/dL   RDW 14.7 82.9 - 56.2 %   Platelets 228 150 - 400 K/uL   nRBC 0.0 0.0 - 0.2 %   Neutrophils Relative % 82 %   Neutro Abs 4.9 1.7 - 7.7 K/uL   Lymphocytes Relative 10 %   Lymphs Abs 0.6 (L) 0.7 - 4.0 K/uL   Monocytes Relative 7 %   Monocytes Absolute 0.4 0.1 - 1.0 K/uL   Eosinophils Relative 1 %   Eosinophils Absolute 0.0 0.0 - 0.5 K/uL   Basophils Relative 0 %   Basophils Absolute 0.0 0.0 - 0.1 K/uL   Immature Granulocytes 0 %   Abs Immature Granulocytes 0.01 0.00 - 0.07 K/uL  Protime-INR   Collection Time: 05/19/23  5:35 AM  Result Value Ref Range   Prothrombin Time 13.1 11.4 - 15.2 seconds   INR 1.0 0.8 - 1.2  Resp panel by RT-PCR (RSV, Flu A&B, Covid)   Collection Time: 05/19/23  5:41 AM   Specimen: Nasal Swab  Result Value Ref Range   SARS Coronavirus 2 by RT PCR NEGATIVE NEGATIVE   Influenza A by PCR NEGATIVE NEGATIVE   Influenza B by PCR NEGATIVE NEGATIVE   Resp Syncytial Virus by PCR NEGATIVE NEGATIVE  I-Stat Lactic Acid, ED   Collection Time: 05/19/23  5:43 AM  Result Value Ref Range   Lactic Acid, Venous 2.7 (HH) 0.5 - 1.9 mmol/L   Comment NOTIFIED PHYSICIAN   Urinalysis, w/ Reflex to Culture (Infection Suspected) -Urine, Clean Catch   Collection Time: 05/19/23  8:25 AM  Result Value Ref Range   Specimen Source URINE, CLEAN CATCH    Color, Urine YELLOW YELLOW   APPearance CLEAR CLEAR   Specific Gravity, Urine 1.012 1.005 - 1.030   pH  7.0 5.0 - 8.0   Glucose, UA NEGATIVE NEGATIVE mg/dL   Hgb urine dipstick NEGATIVE NEGATIVE   Bilirubin Urine NEGATIVE NEGATIVE   Ketones, ur NEGATIVE NEGATIVE mg/dL   Protein, ur NEGATIVE NEGATIVE mg/dL   Nitrite NEGATIVE NEGATIVE   Leukocytes,Ua NEGATIVE NEGATIVE   RBC / HPF 0-5 0 - 5 RBC/hpf   WBC, UA 0-5  0 - 5 WBC/hpf   Bacteria, UA RARE (A) NONE SEEN   Squamous Epithelial / HPF 0-5 0 - 5 /HPF  Rapid urine drug screen (hospital performed)   Collection Time: 05/19/23  9:12 AM  Result Value Ref Range   Opiates NONE DETECTED NONE DETECTED   Cocaine NONE DETECTED NONE DETECTED   Benzodiazepines NONE DETECTED NONE DETECTED   Amphetamines NONE DETECTED NONE DETECTED   Tetrahydrocannabinol POSITIVE (A) NONE DETECTED   Barbiturates NONE DETECTED NONE DETECTED  I-Stat Lactic Acid, ED   Collection Time: 05/19/23 10:16 AM  Result Value Ref Range   Lactic Acid, Venous 2.2 (HH) 0.5 - 1.9 mmol/L   Comment NOTIFIED PHYSICIAN   Respiratory (~20 pathogens) panel by PCR   Collection Time: 05/19/23 11:53 AM   Specimen: Nasopharyngeal Swab; Respiratory  Result Value Ref Range   Adenovirus NOT DETECTED NOT DETECTED   Coronavirus 229E NOT DETECTED NOT DETECTED   Coronavirus HKU1 NOT DETECTED NOT DETECTED   Coronavirus NL63 NOT DETECTED NOT DETECTED   Coronavirus OC43 NOT DETECTED NOT DETECTED   Metapneumovirus NOT DETECTED NOT DETECTED   Rhinovirus / Enterovirus NOT DETECTED NOT DETECTED   Influenza A NOT DETECTED NOT DETECTED   Influenza B NOT DETECTED NOT DETECTED   Parainfluenza Virus 1 NOT DETECTED NOT DETECTED   Parainfluenza Virus 2 NOT DETECTED NOT DETECTED   Parainfluenza Virus 3 NOT DETECTED NOT DETECTED   Parainfluenza Virus 4 NOT DETECTED NOT DETECTED   Respiratory Syncytial Virus NOT DETECTED NOT DETECTED   Bordetella pertussis NOT DETECTED NOT DETECTED   Bordetella Parapertussis NOT DETECTED NOT DETECTED   Chlamydophila pneumoniae NOT DETECTED NOT DETECTED   Mycoplasma  pneumoniae NOT DETECTED NOT DETECTED  Lactic acid, plasma   Collection Time: 05/19/23 12:29 PM  Result Value Ref Range   Lactic Acid, Venous 3.0 (HH) 0.5 - 1.9 mmol/L  Magnesium   Collection Time: 05/19/23  1:15 PM  Result Value Ref Range   Magnesium 1.3 (L) 1.7 - 2.4 mg/dL  Procalcitonin   Collection Time: 05/19/23  1:15 PM  Result Value Ref Range   Procalcitonin 6.25 ng/mL  Lipase, blood   Collection Time: 05/19/23  1:15 PM  Result Value Ref Range   Lipase 23 11 - 51 U/L   CT CHEST ABDOMEN PELVIS WO CONTRAST Result Date: 05/19/2023 CLINICAL DATA:  Sepsis. Recent stroke. Nausea, vomiting and diarrhea. Upper abdominal pain. EXAM: CT CHEST, ABDOMEN AND PELVIS WITHOUT CONTRAST TECHNIQUE: Multidetector CT imaging of the chest, abdomen and pelvis was performed following the standard protocol without IV contrast. RADIATION DOSE REDUCTION: This exam was performed according to the departmental dose-optimization program which includes automated exposure control, adjustment of the mA and/or kV according to patient size and/or use of iterative reconstruction technique. COMPARISON:  CT scan abdomen and pelvis from 08/17/2022 and CT scan chest from 12/22/2017. FINDINGS: CT CHEST FINDINGS Cardiovascular: Normal cardiac size. No pericardial effusion. No aortic aneurysm. 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 Mediastinum/Nodes: Visualized thyroid gland appears grossly unremarkable. No solid / cystic mediastinal masses. The esophagus is nondistended precluding optimal assessment. There are few mildly prominent mediastinal lymph nodes, which do not meet the size criteria for lymphadenopathy and appear grossly similar to the prior study, favoring benign etiology. No axillary lymphadenopathy by size criteria. Evaluation of bilateral hila is limited due to lack on intravenous contrast: however,  no large hilar lymphadenopathy identified.  There are few calcified left hilar lymph nodes, similar to the prior study and likely sequela of prior granulomatous infection. Lungs/Pleura: The central tracheo-bronchial tree is patent. There are multiple new small groupings of peribronchovascular solid and ground-glass opacity throughout bilateral lungs with asymmetric more involvement of middle lobe and right lower lobe. Findings are nonspecific and differential diagnosis includes multilobar pneumonia, aspiration, atypical pneumonia, cryptogenic organizing pneumonia, etc. No pleural effusion or pneumothorax. Musculoskeletal: The visualized soft tissues of the chest wall are grossly unremarkable. No suspicious osseous lesions. CT ABDOMEN PELVIS FINDINGS Hepatobiliary: The liver is normal in size. Non-cirrhotic configuration. No suspicious mass. No intrahepatic or extrahepatic bile duct dilation. Gallbladder is surgically absent. Pancreas: Unremarkable. No pancreatic ductal dilatation or surrounding inflammatory changes. Spleen: Normal in size. There are multiple sub-5 mm calcified granulomas in the spleen. No other focal lesion. Adrenals/Urinary Tract: Adrenal glands are unremarkable. No suspicious renal mass within the limitations of this unenhanced exam. There are at least 3, sub 3 mm nonobstructing calculi each in bilateral kidneys. No ureterolithiasis or obstructive uropathy on either side. Unremarkable urinary bladder. Stomach/Bowel: No disproportionate dilation of the small or large bowel loops. No evidence of abnormal bowel wall thickening or inflammatory changes. The appendix was not visualized; however there is no acute inflammatory process in the right lower quadrant. Vascular/Lymphatic: No ascites or pneumoperitoneum. No abdominal or pelvic lymphadenopathy, by size criteria. No aneurysmal dilation of the major abdominal arteries. Reproductive: The uterus is surgically absent. No large adnexal mass. Other:  There is a tiny fat containing umbilical hernia. There are soft tissue density areas in the anterior abdominal wall subcutaneous tissue, most likely due to medication injections. Musculoskeletal: No suspicious osseous lesions. IMPRESSION: 1. There are multiple new small groupings of peribronchovascular solid and ground-glass opacities throughout bilateral lungs, as described above. Findings are nonspecific and differential diagnosis includes multilobar pneumonia, aspiration, atypical pneumonia, cryptogenic organizing pneumonia, etc. Correlate clinically. 2. No acute inflammatory process identified within the abdomen or pelvis. 3. There are bilateral, sub 3 mm, nonobstructing renal calculi. No ureterolithiasis or obstructive uropathy. 4. Multiple other nonacute observations, as described above. Electronically Signed   By: Jules Schick M.D.   On: 05/19/2023 12:30   DG Chest 2 View Result Date: 05/19/2023 CLINICAL DATA:  43 year old female with possible sepsis. EXAM: CHEST - 2 VIEW COMPARISON:  Chest x-ray 04/22/2023. FINDINGS: Lung volumes are normal. Mild elevation of the right hemidiaphragm. No consolidative airspace disease. No pleural effusions. No pneumothorax. No pulmonary nodule or mass noted. Pulmonary vasculature and the cardiomediastinal silhouette are within normal limits. IMPRESSION: 1. No radiographic evidence of acute cardiopulmonary disease. Electronically Signed   By: Trudie Reed M.D.   On: 05/19/2023 06:11      Achille Rich, PA-C 05/19/23 1749    Wynetta Fines, MD 05/20/23 [phone]

## 2023-05-19 NOTE — H&P (Signed)
 History and Physical    Patient: Brittany Mullins WGN:[id number] DOB: 03-06-1981 DOA: 05/19/2023 DOS: the patient was seen and examined on 05/19/2023 PCP: Patient, No Pcp Per  Patient coming from: Home - lives with her boyfriend. Ambulates with walker.    Chief Complaint: abdominal pain, vomiting   HPI: Brittany Mullins is a 43 y.o. 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 She states it started in the middle of the night and has at least 10 episodes of vomiting. Abdominal pain is diffuse, worse in LLQ. She states this is worse than her chronic abdominal pain. She states pain does not radiate, is sharp and constant and rated as a 10/10. Hot water makes it better. Not associated with food or drink.  Has had 3 episodes of diarrhea. Last took her methadone yesterday AM. She denies any fever/chills, shortness of breath or cough. She has some dysuria, but no other urinary symptoms.   Denies any fever/chills, still has blurry vision in her left eye after she had seizure at rehab. Was checked out by neurology. No change since that time.  +chest pain, no palpitation. + dysuria and denies leg swelling.    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   Does not smoke or drink alcohol.   ER Course:  vitals: temp: 102.7, bp: 182/89, HR:107, RR: 21, oxygen:95% RA Pertinent labs: potassium: 3.1, lactic acid: 2.7>2.2 CXR: no acute finding  CT abdomen/pelvis: There are multiple new small groupings of peribronchovascular solid and ground-glass opacities throughout bilateral lungs, as described above. Findings are nonspecific and differential diagnosis includes multilobar pneumonia, aspiration, atypical  pneumonia, cryptogenic organizing pneumonia, etc. Correlate clinically. 2. No acute inflammatory process identified within the abdomen or pelvis. 3. There are bilateral, sub 3 mm, nonobstructing renal calculi. No ureterolithiasis or obstructive uropathy. In ED: started on broad spectrum abx, given 2L IVF, potassium. BC obtained.    Review of Systems: As mentioned in the history of present illness. All other systems reviewed and are negative. Past Medical History:  Diagnosis Date   Addison's disease (HCC)    Brain tumor (benign) (HCC)    Chronic pancreatitis (HCC)    Hearing loss    Hypertension    Marijuana use    Per pt: "medical marijuana patient"   Pancreatitis    PCOS (polycystic ovarian syndrome)    Pituitary tumor    Seizures (HCC)    Past Surgical History:  Procedure Laterality Date   ABDOMINAL HYSTERECTOMY     APPENDECTOMY     BIOPSY  08/16/2021   Procedure: BIOPSY;  Surgeon: Shellia Cleverly, DO;  Location: [facility];  Service: Gastroenterology;;   CESAREAN SECTION     CHOLECYSTECTOMY     ELBOW SURGERY     ESOPHAGOGASTRODUODENOSCOPY (EGD) WITH PROPOFOL N/A 08/16/2021   Procedure: ESOPHAGOGASTRODUODENOSCOPY (EGD) WITH PROPOFOL;  Surgeon: Shellia Cleverly, DO;  Location: [facility];  Service: Gastroenterology;  Laterality: N/A;   kidney stent     pancreatic stent     RADIOLOGY WITH ANESTHESIA N/A 04/25/2023   Procedure: MRI of Brain without contrast;  Surgeon: Radiologist, Medication, MD;  Location: [facility];  Service: Radiology;  Laterality: N/A;   RADIOLOGY WITH ANESTHESIA N/A 05/03/2023   Procedure: MRI WITH ANESTHESIA CERVICAL SPINE WITHOUT CONTRAST;  Surgeon: Radiologist, Medication, MD;  Location: [facility];  Service: Radiology;  Laterality: N/A;   Social History:  reports that she has never smoked. She has never used smokeless tobacco. She reports that she does not currently use drugs after having used the following drugs: Marijuana. She reports that she does not  drink alcohol.  Allergies  Allergen Reactions   Bee Venom Anaphylaxis   Iodinated Contrast Media Anaphylaxis   Other Anaphylaxis    CT dye   Toradol [Ketorolac Tromethamine] Shortness Of Breath   Benadryl [Diphenhydramine] Other (See Comments)    States she cannot breathe    Family History  Problem Relation Age of Onset   Cancer Mother    Hypertension Mother    Diabetes Maternal Grandmother    Diabetes Maternal Grandfather     Prior to Admission medications   Medication Sig Start Date End Date Taking? Authorizing Provider  acetaminophen (TYLENOL) 325 MG tablet Take 2 tablets (650 mg total) by mouth every 6 (six) hours as needed for mild pain (pain score 1-3). 05/16/23   Love, Evlyn Kanner, PA-C  albuterol (VENTOLIN HFA) 108 (90 Base) MCG/ACT inhaler Inhale 1-2 puffs into the lungs every 6 (six) hours as needed for wheezing or shortness of breath. Patient not taking: Reported on 04/22/2023 03/16/23   Henderly, Britni A, PA-C  amLODipine (NORVASC) 5 MG tablet Take 1 tablet (5 mg total) by mouth daily. 05/16/23   Love, Evlyn Kanner, PA-C  ARIPiprazole (ABILIFY) 5 MG tablet Take 1 tablet (5 mg total) by mouth daily. 05/17/23   Love, Evlyn Kanner, PA-C  aspirin EC 81 MG tablet Take 1 tablet (81 mg total) by mouth daily. Swallow whole. 05/16/23   Love, Evlyn Kanner, PA-C  atorvastatin (LIPITOR) 40 MG tablet Take 1 tablet (40 mg total) by mouth daily. 05/16/23   Love, Evlyn Kanner, PA-C  capsicum (ZOSTRIX) 0.075 % topical cream Apply topically 2 (two) times daily. Apply to shoulders--avoid eyes and broken skin 05/16/23   Love, Evlyn Kanner, PA-C  cyclobenzaprine (FLEXERIL) 5 MG tablet Take 1 tablet (5 mg total) by mouth 3 (three) times daily as needed for muscle spasms. 05/16/23   Love, Evlyn Kanner, PA-C  gabapentin (NEURONTIN) 300 MG capsule Take 1 capsule (300 mg total) by mouth 2 (two) times daily with breakfast and with lunch 05/16/23   Love, Evlyn Kanner, PA-C  gabapentin (NEURONTIN) 400 MG capsule Take 1 capsule (400 mg  total) by mouth at bedtime. 05/16/23   Love, Evlyn Kanner, PA-C  hydrocortisone (CORTEF) 5 MG tablet Take 2 tablets (10 mg total) by mouth daily with breakfast AND 1 tablet (5 mg total) daily before supper. 05/16/23   Love, Evlyn Kanner, PA-C  hydrOXYzine (ATARAX) 10 MG tablet Take 1 tablet (10 mg total) by mouth 3 (three) times daily as needed for anxiety. 05/16/23   Love, Evlyn Kanner, PA-C  lamoTRIgine (LAMICTAL) 25 MG tablet Take 1 tablet (25 mg total) by mouth 2 (two) times daily. 05/16/23   Love, Evlyn Kanner, PA-C  levETIRAcetam (KEPPRA) 500 MG tablet Take 1 tablet (500 mg total) by mouth every 8 (eight) hours. 05/16/23   Love, Evlyn Kanner, PA-C  losartan (COZAAR) 25 MG tablet Take 1 tablet (25 mg total) by mouth daily. 05/16/23   Love, Evlyn Kanner, PA-C  methadone (DOLOPHINE) 10 MG/ML solution Take 120 mg by mouth daily.    [provider]  naloxone (NARCAN) 0.4 MG/ML injection Inject 1 mL (0.4 mg total) into the vein as needed. 05/01/23  Briant Cedar, MD  pantoprazole (PROTONIX) 40 MG tablet Take 1 tablet (40 mg total) by mouth daily at 6 (six) AM. 05/16/23   Love, Evlyn Kanner, PA-C  polyethylene glycol (MIRALAX / GLYCOLAX) 17 g packet Take 17 g by mouth daily as needed for mild constipation. 05/01/23   Briant Cedar, MD  polyvinyl alcohol (LIQUIFILM TEARS) 1.4 % ophthalmic solution Place 1 drop into both eyes 2 (two) times daily. 05/16/23   Love, Evlyn Kanner, PA-C  senna-docusate (SENOKOT-S) 8.6-50 MG tablet Take 2 tablets by mouth daily at 6 (six) AM. 05/16/23   Love, Evlyn Kanner, PA-C  traZODone (DESYREL) 50 MG tablet Take 1 tablet (50 mg total) by mouth at bedtime as needed for sleep. 05/17/23   Love, Evlyn Kanner, PA-C  VIENVA 0.1-20 MG-MCG tablet Take 1 tablet by mouth daily. 03/21/23   [provider]    Physical Exam: Vitals:   05/19/23 1330 05/19/23 1400 05/19/23 1515 05/19/23 1700  BP: (!) 158/97 (!) 169/94 (!) 178/107 (!) 178/108  Pulse: 100 97 94 (!) 9  Resp: (!) 21 (!) 27 19 (!) 22   Temp:      TempSrc:      SpO2: 97% 97% 98% 99%  Weight:      Height:       General:  Appears calm and comfortable and is in NAD Eyes:  PERRL, EOMI, normal lids, iris ENT:  grossly normal hearing, lips & tongue, mmm; appropriate dentition Neck:  no LAD, masses or thyromegaly; no carotid bruits Cardiovascular:  RRR, no m/r/g. No LE edema.  Respiratory:   CTA bilaterally with no wheezes/rales/rhonchi.  Normal respiratory effort. Abdomen:  soft, TTP over entire abdomen. BS+  Back:   normal alignment, no CVAT Skin:  no rash or induration seen on limited exam Musculoskeletal:  grossly normal tone BUE/BLE, good ROM, no bony abnormality Lower extremity:  No LE edema.  Limited foot exam with no ulcerations.  2+ distal pulses. Psychiatric:  grossly normal mood and affect, speech fluent and appropriate, AOx3 Neurologic:  CN 2-12 grossly intact, moves all extremities in coordinated fashion, sensation intact   Radiological Exams on Admission: Independently reviewed - see discussion in A/P where applicable  CT CHEST ABDOMEN PELVIS WO CONTRAST Result Date: 05/19/2023 CLINICAL DATA:  Sepsis. Recent stroke. Nausea, vomiting and diarrhea. Upper abdominal pain. EXAM: CT CHEST, ABDOMEN AND PELVIS WITHOUT CONTRAST TECHNIQUE: Multidetector CT imaging of the chest, abdomen and pelvis was performed following the standard protocol without IV contrast. RADIATION DOSE REDUCTION: This exam was performed according to the departmental dose-optimization program which includes automated exposure control, adjustment of the mA and/or kV according to patient size and/or use of iterative reconstruction technique. COMPARISON:  CT scan abdomen and pelvis from 08/17/2022 and CT scan chest from 12/22/2017. FINDINGS: CT CHEST FINDINGS Cardiovascular: Normal cardiac size. No pericardial effusion. No aortic aneurysm. Aberrant origin of right subclavian artery noted, which arises distal to the left subclavian artery and courses  towards the right side, posterior to the esophagus. Mediastinum/Nodes: Visualized thyroid gland appears grossly unremarkable. No solid / cystic mediastinal masses. The esophagus is nondistended precluding optimal assessment. There are few mildly prominent mediastinal lymph nodes, which do not meet the size criteria for lymphadenopathy and appear grossly similar to the prior study, favoring benign etiology. No axillary lymphadenopathy by size criteria. Evaluation of bilateral hila is limited due to lack on intravenous contrast: however, no large hilar lymphadenopathy identified. There are few calcified left hilar lymph nodes, similar to  the prior study and likely sequela of prior granulomatous infection. Lungs/Pleura: The central tracheo-bronchial tree is patent. There are multiple new small groupings of peribronchovascular solid and ground-glass opacity throughout bilateral lungs with asymmetric more involvement of middle lobe and right lower lobe. Findings are nonspecific and differential diagnosis includes multilobar pneumonia, aspiration, atypical pneumonia, cryptogenic organizing pneumonia, etc. No pleural effusion or pneumothorax. Musculoskeletal: The visualized soft tissues of the chest wall are grossly unremarkable. No suspicious osseous lesions. CT ABDOMEN PELVIS FINDINGS Hepatobiliary: The liver is normal in size. Non-cirrhotic configuration. No suspicious mass. No intrahepatic or extrahepatic bile duct dilation. Gallbladder is surgically absent. Pancreas: Unremarkable. No pancreatic ductal dilatation or surrounding inflammatory changes. Spleen: Normal in size. There are multiple sub-5 mm calcified granulomas in the spleen. No other focal lesion. Adrenals/Urinary Tract: Adrenal glands are unremarkable. No suspicious renal mass within the limitations of this unenhanced exam. There are at least 3, sub 3 mm nonobstructing calculi each in bilateral kidneys. No ureterolithiasis or obstructive uropathy on either  side. Unremarkable urinary bladder. Stomach/Bowel: No disproportionate dilation of the small or large bowel loops. No evidence of abnormal bowel wall thickening or inflammatory changes. The appendix was not visualized; however there is no acute inflammatory process in the right lower quadrant. Vascular/Lymphatic: No ascites or pneumoperitoneum. No abdominal or pelvic lymphadenopathy, by size criteria. No aneurysmal dilation of the major abdominal arteries. Reproductive: The uterus is surgically absent. No large adnexal mass. Other: There is a tiny fat containing umbilical hernia. There are soft tissue density areas in the anterior abdominal wall subcutaneous tissue, most likely due to medication injections. Musculoskeletal: No suspicious osseous lesions. IMPRESSION: 1. There are multiple new small groupings of peribronchovascular solid and ground-glass opacities throughout bilateral lungs, as described above. Findings are nonspecific and differential diagnosis includes multilobar pneumonia, aspiration, atypical pneumonia, cryptogenic organizing pneumonia, etc. Correlate clinically. 2. No acute inflammatory process identified within the abdomen or pelvis. 3. There are bilateral, sub 3 mm, nonobstructing renal calculi. No ureterolithiasis or obstructive uropathy. 4. Multiple other nonacute observations, as described above. Electronically Signed   By: Jules Schick M.D.   On: 05/19/2023 12:30   DG Chest 2 View Result Date: 05/19/2023 CLINICAL DATA:  43 year old female with possible sepsis. EXAM: CHEST - 2 VIEW COMPARISON:  Chest x-ray 04/22/2023. FINDINGS: Lung volumes are normal. Mild elevation of the right hemidiaphragm. No consolidative airspace disease. No pleural effusions. No pneumothorax. No pulmonary nodule or mass noted. Pulmonary vasculature and the cardiomediastinal silhouette are within normal limits. IMPRESSION: 1. No radiographic evidence of acute cardiopulmonary disease. Electronically Signed   By:  Trudie Reed M.D.   On: 05/19/2023 06:11    EKG: Independently reviewed.  Sinus tachycardia with rate 117; nonspecific ST changes with no evidence of acute ischemia   Labs on Admission: I have personally reviewed the available labs and imaging studies at the time of the admission.  Pertinent labs:   potassium: 3.1,  lactic acid: 2.7>2.2  Assessment and Plan: Principal Problem:   Sepsis (HCC) Active Problems:   Hypokalemia/hypomangesia   Intractable nausea and vomiting with acute abdominal pain   Blurry vision, left eye   Chronic pain disorder   History of Addison's disease   HTN (hypertension)   Stroke (cerebrum) (HCC)   low protein S   Bipolar I disorder with mixed features (HCC)   Marijuana use   Nephrolithiasis   Dysphagia   Seizure (HCC)    Assessment and Plan: * Sepsis (HCC) 43 year old female presenting to  ED after getting discharged from [facility] yesterday with complaints of abdominal pain and fever found to be septic with tachycardia, fever, lactic acidosis and possible pneumonia on CT abdomen/pelvis +/-viral gastroenteritis  -obs to progressive for stable sepsis -pan cultured -on broad spectrum abx with recent prolonged stay at [facility] -received 2L IVF, continue -complaining of abdominal pain, but has chronic abdominal pain/chronic pancreatitis on methadone.>check lipase. CT with no abnormal findings, but without contrast.  -stool studies pending  -consider HRCT lung if concern for COP  -trend PCT/lactic acid -check RVP    Hypokalemia/hypomangesia Check magnesium-low and repleted. Trend  Repleted in ED Trend   Intractable nausea and vomiting with acute abdominal pain History of chronic  nausea/vomiting, but states she typically only has 2 episodes/day and this is much worse History of chronic abdominal pain/pancreatitis. Lipase pending. She states this is worse, no CT findings of any acute process  ? Viral gastroenteritis, stool study pending. RvP pending as  well  Continue anti-emetics, IVF S/p cholecystectomy, CT abdomen/pelvis with no acute findings  Clear liquid diet  Continue home methadone, PRN IV morphine for breakthrough pain   Blurry vision, left eye States started after she had a stroke a few night ago Evaluated by neurology on 2/14 with normal repeat CTH She blinks with threat to eye and exam normal, thought functional  When more alert would do exam and consider optho consult   Chronic pain disorder Last had  methadone on 2/15 Will continue home medication  With acute pain will add on some PRN IV morphine   History of Addison's disease Continue cortef  Received 100mg  IV hydrocortisone in ED 2/16  HTN (hypertension) Hypertensive Continue home norvasc  IV labetalol PRN   Stroke (cerebrum) (HCC) Recent stroke on 04/22/23.  Per neurology recommended DAPT x 3 weeks then ASA alone. Completed plavix on 05/13/23.  Continue ASA, statin   low protein S Found on recent stroke w/u Referred to hematology   Bipolar I disorder with mixed features (HCC) Continue abilify   Marijuana use UDS + MJ   Nephrolithiasis Non obstructing renal calculi, sub 3mm. Bilateral   Dysphagia Note in chart with concern that she needs help eating Will have SLP see her, aspiration precautions  On liquid diet for now   Seizure (HCC) Continue with keppra 500mg  q 8 hours Lamictal 25mg  BID Seizure precautions     Advance Care Planning:   Code Status: Full Code   Consults: ST   DVT Prophylaxis: lovenox   Family Communication: none   Severity of Illness: The appropriate patient status for this patient is OBSERVATION. Observation status is judged to be reasonable and necessary in order to provide the required intensity of service to ensure the patient's safety. The patient's presenting symptoms, physical exam findings, and initial radiographic and laboratory data in the context of their medical condition is felt to place them at decreased  risk for further clinical deterioration. Furthermore, it is anticipated that the patient will be medically stable for discharge from the hospital within 2 midnights of admission.   Author: Orland Mustard, MD 05/19/2023 6:10 PM  For on call review [email or web address].

## 2023-05-19 NOTE — Assessment & Plan Note (Signed)
 Note in chart with concern that she needs help eating Will have SLP see her, aspiration precautions  On liquid diet for now

## 2023-05-19 NOTE — Assessment & Plan Note (Signed)
 Found on recent stroke w/u Referred to hematology

## 2023-05-19 NOTE — Assessment & Plan Note (Signed)
 UDS + MJ

## 2023-05-19 NOTE — Assessment & Plan Note (Signed)
 Recent stroke on 04/22/23.  Per neurology recommended DAPT x 3 weeks then ASA alone. Completed plavix on 05/13/23.  Continue ASA, statin

## 2023-05-19 NOTE — Progress Notes (Signed)
 ED Pharmacy Antibiotic Sign Off An antibiotic consult was received from an ED provider for cefepime per pharmacy dosing for sepsis. A chart review was completed to assess appropriateness.  The following one time order(s) were placed per pharmacy consult:  cefepime 2000 mg x 1 dose  Further antibiotic and/or antibiotic pharmacy consults should be ordered by the admitting provider if indicated.   Thank you for allowing pharmacy to be a part of this patient's care.   Delmar Landau, PharmD, BCPS 05/19/2023 11:26 AM ED Clinical Pharmacist -  [phone]

## 2023-05-19 NOTE — ED Provider Notes (Signed)
 [place] EMERGENCY DEPARTMENT AT [facility] Provider Note   CSN: [id number] Arrival date & time: 05/19/23  0510     History  Chief Complaint  Patient presents with   Emesis    Joselin Crandell is a 43 y.o. female.  43 y/o female with hx of Addison's disease, PCOS, pancreatitis, PNES, chronic pain on methadone, bipolar disorder and marijuana use presents the emergency department for nausea, vomiting, diarrhea.  Symptoms began 2 hours ago.  She has associated pain in her upper abdomen.  Does have a history of intractable vomiting for which she has been seen in the emergency department before.  Also states that multiple people in her home have been sick with similar symptoms.  She has not taken anything for her nausea or pain since symptoms began.  Reports 1 episode of urinary incontinence associated with ongoing emesis.  Reports some burning dysuria, but states she was checked for a urinary tract infection prior to discharge from SNF.  Was unaware that she was febrile prior to transfer to the ED.  Recent hospitalization for L pontine CVA. Discharged from SNF yesterday AM.        Home Medications Prior to Admission medications   Medication Sig Start Date End Date Taking? Authorizing Provider  acetaminophen (TYLENOL) 325 MG tablet Take 2 tablets (650 mg total) by mouth every 6 (six) hours as needed for mild pain (pain score 1-3). 05/16/23   Love, Evlyn Kanner, PA-C  albuterol (VENTOLIN HFA) 108 (90 Base) MCG/ACT inhaler Inhale 1-2 puffs into the lungs every 6 (six) hours as needed for wheezing or shortness of breath. Patient not taking: Reported on 04/22/2023 03/16/23   Henderly, Britni A, PA-C  amLODipine (NORVASC) 5 MG tablet Take 1 tablet (5 mg total) by mouth daily. 05/16/23   Love, Evlyn Kanner, PA-C  ARIPiprazole (ABILIFY) 5 MG tablet Take 1 tablet (5 mg total) by mouth daily. 05/17/23   Love, Evlyn Kanner, PA-C  aspirin EC 81 MG tablet Take 1 tablet (81 mg total) by mouth  daily. Swallow whole. 05/16/23   Love, Evlyn Kanner, PA-C  atorvastatin (LIPITOR) 40 MG tablet Take 1 tablet (40 mg total) by mouth daily. 05/16/23   Love, Evlyn Kanner, PA-C  capsicum (ZOSTRIX) 0.075 % topical cream Apply topically 2 (two) times daily. Apply to shoulders--avoid eyes and broken skin 05/16/23   Love, Evlyn Kanner, PA-C  cyclobenzaprine (FLEXERIL) 5 MG tablet Take 1 tablet (5 mg total) by mouth 3 (three) times daily as needed for muscle spasms. 05/16/23   Love, Evlyn Kanner, PA-C  gabapentin (NEURONTIN) 300 MG capsule Take 1 capsule (300 mg total) by mouth 2 (two) times daily with breakfast and with lunch 05/16/23   Love, Evlyn Kanner, PA-C  gabapentin (NEURONTIN) 400 MG capsule Take 1 capsule (400 mg total) by mouth at bedtime. 05/16/23   Love, Evlyn Kanner, PA-C  hydrocortisone (CORTEF) 5 MG tablet Take 2 tablets (10 mg total) by mouth daily with breakfast AND 1 tablet (5 mg total) daily before supper. 05/16/23   Love, Evlyn Kanner, PA-C  hydrOXYzine (ATARAX) 10 MG tablet Take 1 tablet (10 mg total) by mouth 3 (three) times daily as needed for anxiety. 05/16/23   Love, Evlyn Kanner, PA-C  lamoTRIgine (LAMICTAL) 25 MG tablet Take 1 tablet (25 mg total) by mouth 2 (two) times daily. 05/16/23   Love, Evlyn Kanner, PA-C  levETIRAcetam (KEPPRA) 500 MG tablet Take 1 tablet (500 mg total) by mouth every 8 (eight) hours. 05/16/23  Love, Pamela S, PA-C  losartan (COZAAR) 25 MG tablet Take 1 tablet (25 mg total) by mouth daily. 05/16/23   Love, Evlyn Kanner, PA-C  methadone (DOLOPHINE) 10 MG/ML solution Take 120 mg by mouth daily.    [provider]  naloxone (NARCAN) 0.4 MG/ML injection Inject 1 mL (0.4 mg total) into the vein as needed. 05/01/23   Briant Cedar, MD  pantoprazole (PROTONIX) 40 MG tablet Take 1 tablet (40 mg total) by mouth daily at 6 (six) AM. 05/16/23   Love, Evlyn Kanner, PA-C  polyethylene glycol (MIRALAX / GLYCOLAX) 17 g packet Take 17 g by mouth daily as needed for mild constipation. 05/01/23   Briant Cedar, MD  polyvinyl alcohol (LIQUIFILM TEARS) 1.4 % ophthalmic solution Place 1 drop into both eyes 2 (two) times daily. 05/16/23   Love, Evlyn Kanner, PA-C  senna-docusate (SENOKOT-S) 8.6-50 MG tablet Take 2 tablets by mouth daily at 6 (six) AM. 05/16/23   Love, Evlyn Kanner, PA-C  traZODone (DESYREL) 50 MG tablet Take 1 tablet (50 mg total) by mouth at bedtime as needed for sleep. 05/17/23   Love, Evlyn Kanner, PA-C  VIENVA 0.1-20 MG-MCG tablet Take 1 tablet by mouth daily. 03/21/23   [provider]      Allergies    Bee venom, Toradol [ketorolac tromethamine], Benadryl [diphenhydramine], and Other    Review of Systems   Review of Systems Ten systems reviewed and are negative for acute change, except as noted in the HPI.    Physical Exam Updated Vital Signs BP (!) 182/98   Pulse (!) 115   Temp (!) 102.7 F (39.3 C) (Oral)   Resp 17   Ht 5\' 2"  (1.575 m)   Wt 70.3 kg   SpO2 92%   BMI 28.35 kg/m   Physical Exam Vitals and nursing note reviewed.  Constitutional:      General: She is not in acute distress.    Appearance: She is well-developed. She is ill-appearing.     Comments: Ill-appearing, mildly diaphoretic and pale  HENT:     Head: Normocephalic and atraumatic.  Eyes:     General: No scleral icterus.    Conjunctiva/sclera: Conjunctivae normal.  Cardiovascular:     Rate and Rhythm: Regular rhythm. Tachycardia present.     Pulses: Normal pulses.  Pulmonary:     Effort: Pulmonary effort is normal. No respiratory distress.     Breath sounds: No stridor. No wheezing.     Comments: Respirations even and unlabored.  Lungs clear bilaterally. Abdominal:     Palpations: Abdomen is soft.     Tenderness: There is abdominal tenderness. There is no guarding.     Comments: 4 distinct areas of ecchymosis to the abdominal wall.  Patient reports receiving Lovenox while inpatient.  Tenderness in the upper abdomen without guarding.  No palpable masses, peritoneal signs.  Musculoskeletal:         General: Normal range of motion.     Cervical back: Normal range of motion.  Skin:    General: Skin is warm and dry.     Coloration: Skin is not pale.     Findings: No erythema or rash.  Neurological:     Mental Status: She is alert and oriented to person, place, and time.     Coordination: Coordination normal.  Psychiatric:        Behavior: Behavior normal.     ED Results / Procedures / Treatments   Labs (all labs ordered are  listed, but only abnormal results are displayed) Labs Reviewed  CBC WITH DIFFERENTIAL/PLATELET - Abnormal; Notable for the following components:      Result Value   Lymphs Abs 0.6 (*)    All other components within normal limits  I-STAT CG4 LACTIC ACID, ED - Abnormal; Notable for the following components:   Lactic Acid, Venous 2.7 (*)    All other components within normal limits  CULTURE, BLOOD (ROUTINE X 2)  CULTURE, BLOOD (ROUTINE X 2)  RESP PANEL BY RT-PCR (RSV, FLU A&B, COVID)  RVPGX2  COMPREHENSIVE METABOLIC PANEL  PROTIME-INR  URINALYSIS, W/ REFLEX TO CULTURE (INFECTION SUSPECTED)    EKG None  Radiology DG Chest 2 View Result Date: 05/19/2023 CLINICAL DATA:  43 year old female with possible sepsis. EXAM: CHEST - 2 VIEW COMPARISON:  Chest x-ray 04/22/2023. FINDINGS: Lung volumes are normal. Mild elevation of the right hemidiaphragm. No consolidative airspace disease. No pleural effusions. No pneumothorax. No pulmonary nodule or mass noted. Pulmonary vasculature and the cardiomediastinal silhouette are within normal limits. IMPRESSION: 1. No radiographic evidence of acute cardiopulmonary disease. Electronically Signed   By: Trudie Reed M.D.   On: 05/19/2023 06:11   CT HEAD WO CONTRAST ( ) Result Date: 05/17/2023 CLINICAL DATA:  Stroke, follow up EXAM: CT HEAD WITHOUT CONTRAST TECHNIQUE: Contiguous axial images were obtained from the base of the skull through the vertex without intravenous contrast. RADIATION DOSE REDUCTION: This exam was  performed according to the departmental dose-optimization program which includes automated exposure control, adjustment of the mA and/or kV according to patient size and/or use of iterative reconstruction technique. COMPARISON:  CT head 05/16/2023 FINDINGS: Brain: No evidence of acute large vascular territory infarction, hemorrhage, hydrocephalus, extra-axial collection or mass lesion/mass effect. Partially empty sella. Vascular: No hyperdense vessel. Skull: No acute fracture. Sinuses/Orbits: Clear sinuses.  No acute orbital findings. Other: No mastoid effusions. IMPRESSION: 1. No evidence of acute intracranial abnormality. 2. Partially empty sella, which is often a normal anatomic variant but can be associated with idiopathic intracranial hypertension. Electronically Signed   By: Feliberto Harts M.D.   On: 05/17/2023 21:44    Procedures Procedures    Medications Ordered in ED Medications  lactated ringers bolus 1,000 mL (1,000 mLs Intravenous New Bag/Given 05/19/23 7829)  famotidine (PEPCID) IVPB 20 mg premix (20 mg Intravenous New Bag/Given 05/19/23 0612)  promethazine (PHENERGAN) 25 mg in sodium chloride 0.9 % 50 mL IVPB (0 mg Intravenous Stopped 05/19/23 0612)  acetaminophen (TYLENOL) suppository 650 mg (650 mg Rectal Given 05/19/23 5621)    ED Course/ Medical Decision Making/ A&P                                 Medical Decision Making Amount and/or Complexity of Data Reviewed Labs: ordered. Radiology: ordered.  Risk OTC drugs. Prescription drug management.   This patient presents to the ED for concern of N/V/D, this involves an extensive number of treatment options, and is a complaint that carries with it a high risk of complications and morbidity.  The differential diagnosis includes viral illness vs pSBO/SBO vs ruptured viscous vs opiate withdrawal vs food-borne illness   Co morbidities that complicate the patient evaluation  Addison's dx PCOS PNES Chronic pain w/opiate  dependence   Additional history obtained:  Additional history obtained from EMS personnel External records from outside source obtained and reviewed including EEG from 05/07/23 negative for epileptic activity.   Cardiac Monitoring:  The patient was  maintained on a cardiac monitor.  I personally viewed and interpreted the cardiac monitored which showed an underlying rhythm of: sinus tachycardia   Medicines ordered and prescription drug management:  I ordered medication including APAP for fever and Phenergan for nausea. 100mg  IV SoluCortef ordered as stress dose given SIRS. Reevaluation of the patient after these medicines showed that the patient improved I have reviewed the patients home medicines and have made adjustments as needed    Problem List / ED Course:  Presenting with SIRS criteria from home. Discharged from SNF yesterday after admission for L pontine CVA. Seen in ED prior to this for N/V/D multiple times. Reports symptoms feel similar.    Reevaluation:  After the interventions noted above, I reevaluated the patient and found that they have : remained stable   Dispostion:  Care signed out to [place], [place] at shift change. Patient presently resting in exam room bed. No ongoing emesis since arrival.         Final Clinical Impression(s) / ED Diagnoses Final diagnoses:  Nausea vomiting and diarrhea  SIRS (systemic inflammatory response syndrome) [facility])    Rx / DC Orders ED Discharge Orders     None         Antony Madura, PA-C 05/19/23 3244    Marily Memos, MD 05/20/23 0001

## 2023-05-19 NOTE — Assessment & Plan Note (Addendum)
 43 year old female presenting to ED after getting discharged from [facility] yesterday with complaints of abdominal pain and fever found to be septic with tachycardia, fever, lactic acidosis and possible pneumonia on CT abdomen/pelvis +/-viral gastroenteritis  -obs to progressive for stable sepsis -pan cultured -on broad spectrum abx with recent prolonged stay at CIR -received 2L IVF, continue -complaining of abdominal pain, but has chronic abdominal pain/chronic pancreatitis on methadone.>check lipase. CT with no abnormal findings, but without contrast.  -stool studies pending  -consider HRCT lung if concern for COP  -trend PCT/lactic acid -check RVP

## 2023-05-19 NOTE — ED Triage Notes (Signed)
 Pt BIB [facility] from home. Pt c/o N/V/D and abd pain starting about 2 hrs ago. Pt states she was incontinent when she vomited. HR 120s, BP 170/100  Pt was d/c from hospital on 2/15

## 2023-05-19 NOTE — Assessment & Plan Note (Signed)
 Non obstructing renal calculi, sub 3mm. Bilateral

## 2023-05-19 NOTE — Assessment & Plan Note (Addendum)
 Hypertensive Continue home norvasc  IV labetalol PRN

## 2023-05-19 NOTE — Assessment & Plan Note (Signed)
Continue abilify  

## 2023-05-19 NOTE — Assessment & Plan Note (Addendum)
 Check magnesium-low and repleted. Trend  Repleted in ED Trend

## 2023-05-19 NOTE — Assessment & Plan Note (Signed)
 Continue with keppra 500mg  q 8 hours Lamictal 25mg  BID Seizure precautions

## 2023-05-19 NOTE — ED Notes (Signed)
 Patient transported to CT

## 2023-05-19 NOTE — Assessment & Plan Note (Addendum)
 Continue cortef  Received 100mg  IV hydrocortisone in ED 2/16

## 2023-05-19 NOTE — Progress Notes (Signed)
 Pharmacy Antibiotic Note  Brittany Mullins is a 43 y.o. female for which pharmacy has been consulted for cefepime and vancomycin dosing for pneumonia.  Patient with a history of addisons disease, chronic pancreatitis with chronic N/V, HTN, hx of seizures, hx of stroke, prolactinoma, hx of bipolar disorder, chronic pain d/o secondary to chronic pancreatitis on methadone.  SCr 0.77 WBC 6; LA 2.7>3; T 102.7>98.9; HR 110>100; RR 23>21 COVID neg / flu neg  Plan: Cefepime 2g q8hr  Vancomycin 1500 mg q24hr (eAUC 450.6) unless change in renal function Monitor WBC, fever, renal function, cultures De-escalate when able Levels at steady state F/u MRSA PCR  Height: 5\' 2"  (157.5 cm) Weight: 70.3 kg (154 lb 15.7 oz) IBW/kg (Calculated) : 50.1  Temp (24hrs), Avg:99.8 F (37.7 C), Min:97.9 F (36.6 C), Max:102.7 F (39.3 C)  Recent Labs  Lab 05/13/23 0532 05/16/23 0751 05/19/23 0535 05/19/23 0543 05/19/23 1016 05/19/23 1229  WBC 5.5  --  6.0  --   --   --   CREATININE 0.61 0.77 0.77  --   --   --   LATICACIDVEN  --   --   --  2.7* 2.2* 3.0*    Estimated Creatinine Clearance: 85.9 mL/min (by C-G formula based on SCr of 0.77 mg/dL).    Allergies  Allergen Reactions   Bee Venom Anaphylaxis   Iodinated Contrast Media Anaphylaxis   Other Anaphylaxis    CT dye   Toradol [Ketorolac Tromethamine] Shortness Of Breath   Benadryl [Diphenhydramine] Other (See Comments)    States she cannot breathe   Microbiology results: Pending  Thank you for allowing pharmacy to be a part of this patient's care.  Delmar Landau, PharmD, BCPS 05/19/2023 1:58 PM ED Clinical Pharmacist -  [phone]

## 2023-05-20 ENCOUNTER — Encounter (HOSPITAL_COMMUNITY): Payer: Self-pay | Admitting: Internal Medicine

## 2023-05-20 DIAGNOSIS — Z79899 Other long term (current) drug therapy: Secondary | ICD-10-CM | POA: Diagnosis not present

## 2023-05-20 DIAGNOSIS — R569 Unspecified convulsions: Secondary | ICD-10-CM | POA: Diagnosis not present

## 2023-05-20 DIAGNOSIS — A084 Viral intestinal infection, unspecified: Secondary | ICD-10-CM | POA: Diagnosis present

## 2023-05-20 DIAGNOSIS — R197 Diarrhea, unspecified: Secondary | ICD-10-CM

## 2023-05-20 DIAGNOSIS — R1310 Dysphagia, unspecified: Secondary | ICD-10-CM | POA: Diagnosis present

## 2023-05-20 DIAGNOSIS — K8681 Exocrine pancreatic insufficiency: Secondary | ICD-10-CM | POA: Diagnosis present

## 2023-05-20 DIAGNOSIS — N200 Calculus of kidney: Secondary | ICD-10-CM | POA: Diagnosis present

## 2023-05-20 DIAGNOSIS — F3160 Bipolar disorder, current episode mixed, unspecified: Secondary | ICD-10-CM | POA: Diagnosis present

## 2023-05-20 DIAGNOSIS — K861 Other chronic pancreatitis: Secondary | ICD-10-CM | POA: Diagnosis present

## 2023-05-20 DIAGNOSIS — R112 Nausea with vomiting, unspecified: Secondary | ICD-10-CM

## 2023-05-20 DIAGNOSIS — Z8249 Family history of ischemic heart disease and other diseases of the circulatory system: Secondary | ICD-10-CM | POA: Diagnosis not present

## 2023-05-20 DIAGNOSIS — Z91041 Radiographic dye allergy status: Secondary | ICD-10-CM | POA: Diagnosis not present

## 2023-05-20 DIAGNOSIS — E271 Primary adrenocortical insufficiency: Secondary | ICD-10-CM | POA: Diagnosis present

## 2023-05-20 DIAGNOSIS — E872 Acidosis, unspecified: Secondary | ICD-10-CM | POA: Diagnosis present

## 2023-05-20 DIAGNOSIS — R6510 Systemic inflammatory response syndrome (SIRS) of non-infectious origin without acute organ dysfunction: Secondary | ICD-10-CM | POA: Diagnosis present

## 2023-05-20 DIAGNOSIS — Z8639 Personal history of other endocrine, nutritional and metabolic disease: Secondary | ICD-10-CM | POA: Diagnosis not present

## 2023-05-20 DIAGNOSIS — Z833 Family history of diabetes mellitus: Secondary | ICD-10-CM | POA: Diagnosis not present

## 2023-05-20 DIAGNOSIS — G894 Chronic pain syndrome: Secondary | ICD-10-CM | POA: Diagnosis present

## 2023-05-20 DIAGNOSIS — E876 Hypokalemia: Secondary | ICD-10-CM

## 2023-05-20 DIAGNOSIS — F445 Conversion disorder with seizures or convulsions: Secondary | ICD-10-CM | POA: Diagnosis not present

## 2023-05-20 DIAGNOSIS — Z7982 Long term (current) use of aspirin: Secondary | ICD-10-CM | POA: Diagnosis not present

## 2023-05-20 DIAGNOSIS — G40909 Epilepsy, unspecified, not intractable, without status epilepticus: Secondary | ICD-10-CM | POA: Diagnosis present

## 2023-05-20 DIAGNOSIS — R32 Unspecified urinary incontinence: Secondary | ICD-10-CM | POA: Diagnosis present

## 2023-05-20 DIAGNOSIS — Z1152 Encounter for screening for COVID-19: Secondary | ICD-10-CM | POA: Diagnosis not present

## 2023-05-20 DIAGNOSIS — H9190 Unspecified hearing loss, unspecified ear: Secondary | ICD-10-CM | POA: Diagnosis present

## 2023-05-20 DIAGNOSIS — J188 Other pneumonia, unspecified organism: Secondary | ICD-10-CM | POA: Diagnosis present

## 2023-05-20 DIAGNOSIS — A419 Sepsis, unspecified organism: Secondary | ICD-10-CM | POA: Diagnosis present

## 2023-05-20 DIAGNOSIS — Z7952 Long term (current) use of systemic steroids: Secondary | ICD-10-CM | POA: Diagnosis not present

## 2023-05-20 DIAGNOSIS — Z91030 Bee allergy status: Secondary | ICD-10-CM | POA: Diagnosis not present

## 2023-05-20 DIAGNOSIS — I10 Essential (primary) hypertension: Secondary | ICD-10-CM | POA: Diagnosis present

## 2023-05-20 LAB — COMPREHENSIVE METABOLIC PANEL
ALT: 23 U/L (ref 0–44)
AST: 28 U/L (ref 15–41)
Albumin: 2.9 g/dL — ABNORMAL LOW (ref 3.5–5.0)
Alkaline Phosphatase: 29 U/L — ABNORMAL LOW (ref 38–126)
Anion gap: 10 (ref 5–15)
BUN: 12 mg/dL (ref 6–20)
CO2: 24 mmol/L (ref 22–32)
Calcium: 8.3 mg/dL — ABNORMAL LOW (ref 8.9–10.3)
Chloride: 100 mmol/L (ref 98–111)
Creatinine, Ser: 0.74 mg/dL (ref 0.44–1.00)
GFR, Estimated: >60 mL/min (ref >60)
Glucose, Bld: 93 mg/dL (ref 70–99)
Potassium: 3.5 mmol/L (ref 3.5–5.1)
Sodium: 134 mmol/L — ABNORMAL LOW (ref 135–145)
Total Bilirubin: 0.5 mg/dL (ref 0.0–1.2)
Total Protein: 6 g/dL — ABNORMAL LOW (ref 6.5–8.1)

## 2023-05-20 LAB — CBC
HCT: 40.8 % (ref 36.0–46.0)
Hemoglobin: 13.6 g/dL (ref 12.0–15.0)
MCH: 30.8 pg (ref 26.0–34.0)
MCHC: 33.3 g/dL (ref 30.0–36.0)
MCV: 92.3 fL (ref 80.0–100.0)
Platelets: 221 10*3/uL (ref 150–400)
RBC: 4.42 MIL/uL (ref 3.87–5.11)
RDW: 12.5 % (ref 11.5–15.5)
WBC: 6.7 10*3/uL (ref 4.0–10.5)
nRBC: 0 % (ref 0.0–0.2)

## 2023-05-20 LAB — C DIFFICILE QUICK SCREEN W PCR REFLEX
C Diff antigen: NEGATIVE
C Diff interpretation: NOT DETECTED
C Diff toxin: NEGATIVE

## 2023-05-20 LAB — STREP PNEUMONIAE URINARY ANTIGEN: Strep Pneumo Urinary Antigen: NEGATIVE

## 2023-05-20 LAB — MAGNESIUM: Magnesium: 2.2 mg/dL (ref 1.7–2.4)

## 2023-05-20 LAB — PROCALCITONIN: Procalcitonin: 6.36 ng/mL

## 2023-05-20 MED ORDER — POTASSIUM CHLORIDE CRYS ER 20 MEQ PO TBCR
40.0000 meq | EXTENDED_RELEASE_TABLET | Freq: Once | ORAL | Status: AC
  Administered 2023-05-20: 40 meq via ORAL
  Filled 2023-05-20: qty 2

## 2023-05-20 MED ORDER — CAPSAICIN 0.075 % EX CREA
TOPICAL_CREAM | Freq: Two times a day (BID) | CUTANEOUS | Status: DC
  Administered 2023-05-20 – 2023-05-21 (×2): 1 via TOPICAL
  Filled 2023-05-20 (×2): qty 57

## 2023-05-20 NOTE — Progress Notes (Signed)
 Transition of Care [facility]) - Inpatient Brief Assessment   Patient Details  Name: Brittany Mullins MRN: [id number] Date of Birth: July 10, 1980  Transition of Care [facility]) CM/SW Contact:    Oletta Cohn, RN Phone Number: 05/20/2023, 10:20 AM   Clinical Narrative: RNCM met with pt at bedside regarding discharge planning. Pt lives with Marchia Meiers and has supportive estended family. Pt states she has DME (BSC, Shower chair and awaiting delivery of rollator).  Pt needs PCP establishment.  TOC will continue to follow.  Transition of Care Asessment: Insurance and Status: Insurance coverage has been reviewed Patient has primary care physician: (P) No (needs PCP) Home environment has been reviewed: (P) home with fiance Prior level of function:: (P) 1-2 person assist to restroom Prior/Current Home Services: (P) Current home services Recruitment consultant) Social Drivers of Health Review: (P) SDOH reviewed no interventions necessary Readmission risk has been reviewed: (P) Yes Transition of care needs: (P) transition of care needs identified, TOC will continue to follow

## 2023-05-20 NOTE — ED Notes (Signed)
Patient placed into hospital bed.

## 2023-05-20 NOTE — Evaluation (Signed)
 Clinical/Bedside Swallow Evaluation Patient Details  Name: Brittany Mullins MRN: [id number] Date of Birth: 01/30/1981  Today's Date: 05/20/2023 Time: SLP Start Time (ACUTE ONLY): 1325 SLP Stop Time (ACUTE ONLY): 1335 SLP Time Calculation (min) (ACUTE ONLY): 10 min  Past Medical History:  Past Medical History:  Diagnosis Date   Addison's disease (HCC)    Brain tumor (benign) (HCC)    Chronic pancreatitis (HCC)    Hearing loss    Hypertension    Marijuana use    Per pt: "medical marijuana patient"   Pancreatitis    PCOS (polycystic ovarian syndrome)    Pituitary tumor    Seizures (HCC)    Past Surgical History:  Past Surgical History:  Procedure Laterality Date   ABDOMINAL HYSTERECTOMY     APPENDECTOMY     BIOPSY  08/16/2021   Procedure: BIOPSY;  Surgeon: Shellia Cleverly, DO;  Location: [facility] ENDOS[facility];  Service: Gastroenterology;;   CESAREAN SECTION     CHOLECYSTECTOMY     ELBOW SURGERY     ESOPHAGOGASTRODUODENOSCOPY (EGD) WITH PROPOFOL N/A 08/16/2021   Procedure: ESOPHAGOGASTRODUODENOSCOPY (EGD) WITH PROPOFOL;  Surgeon: Shellia Cleverly, DO;  Location: [facility] ENDOSCOPY;  Service: Gastroenterology;  Laterality: N/A;   kidney stent     pancreatic stent     RADIOLOGY WITH ANESTHESIA N/A 04/25/2023   Procedure: MRI of Brain without contrast;  Surgeon: Radiologist, Medication, MD;  Location: [facility] OR;  Service: Radiology;  Laterality: N/A;   RADIOLOGY WITH ANESTHESIA N/A 05/03/2023   Procedure: MRI WITH ANESTHESIA CERVICAL SPINE WITHOUT CONTRAST;  Surgeon: Radiologist, Medication, MD;  Location: [facility] OR;  Service: Radiology;  Laterality: N/A;   HPI:  43 yo presenting 2/16 with  nausea, vomiting, diarrhea. She had recent hospitalization for L pontine CVA and d/c from [facility] 2/15. She had MBS while on CIR (2/3) with oropharyngeal swallowing function that was [facility]. Esophageal component was considered given symptoms that included globus sensation, intermittent burning/odynophagia when  eating, burning sensation in nose and back of throat after waking up in the morning, and intermittent chest pain that is aleviated with eructation. PMH includes: Addison's disease, PCOS, pancreatitis, PNES, chronic pain on methadone, bipolar disorder and marijuana use    Assessment / Plan / Recommendation  Clinical Impression  Pt's oropharyngeal swallow appears to be grossly functional, but she does have additional symptoms that could be concerning for a more esophageal component, including solids feeling "stuck" in her chest and intermittent odynophagia. These symptoms are consistent with presentation during recent admission (including throat clearing), at which time MBS was [facility]. Pt agrees that she does not have any new symptoms and that she was planning to f/u with GI on an OP basis. Recommending advancing up to regular solids as she was previously on, per MD discretion as medically appropriate. Would consider esophageal assessment and/or GI f/u given persistent symptoms.  SLP Visit Diagnosis: Dysphagia, unspecified (R13.10)    Aspiration Risk       Diet Recommendation Regular;Thin liquid (advance per MD discretion)    Liquid Administration via: Cup;Straw Medication Administration: Whole meds with liquid Supervision: Patient able to self feed;Intermittent supervision to cue for compensatory strategies Compensations: Minimize environmental distractions;Slow rate;Small sips/bites Postural Changes: Seated upright at 90 degrees;Remain upright for at least 30 minutes after po intake    Other  Recommendations Oral Care Recommendations: Oral care BID    Recommendations for follow up therapy are one component of a multi-disciplinary discharge planning process, led by the attending physician.  Recommendations may be updated  based on patient status, additional functional criteria and insurance authorization.  Follow up Recommendations No SLP follow up      Assistance Recommended at Discharge     Functional Status Assessment Patient has not had a recent decline in their functional status  Frequency and Duration            Prognosis        Swallow Study   General HPI: 43 yo presenting 2/16 with  nausea, vomiting, diarrhea. She had recent hospitalization for L pontine CVA and d/c from [facility] 2/15. She had MBS while on CIR (2/3) with oropharyngeal swallowing function that was [facility]. Esophageal component was considered given symptoms that included globus sensation, intermittent burning/odynophagia when eating, burning sensation in nose and back of throat after waking up in the morning, and intermittent chest pain that is aleviated with eructation. PMH includes: Addison's disease, PCOS, pancreatitis, PNES, chronic pain on methadone, bipolar disorder and marijuana use Type of Study: Bedside Swallow Evaluation Previous Swallow Assessment: see HPI Diet Prior to this Study: Clear liquid diet Temperature Spikes Noted: No Respiratory Status: Room air History of Recent Intubation: No Behavior/Cognition: Alert;Cooperative;Pleasant mood Oral Cavity Assessment: Within Functional Limits Oral Care Completed by SLP: No Oral Cavity - Dentition: Dentures, top;Missing dentition (missing some dentition on the bottom - says she can't wear her partials) Vision: Functional for self-feeding Self-Feeding Abilities: Able to feed self Patient Positioning: Upright in bed Baseline Vocal Quality: Normal Volitional Cough: Strong Volitional Swallow: Able to elicit    Oral/Motor/Sensory Function Overall Oral Motor/Sensory Function: Mild impairment Facial Symmetry: Abnormal symmetry right;Suspected CN VII (facial) dysfunction   Ice Chips Ice chips: Not tested   Thin Liquid Thin Liquid: Within functional limits Presentation: Self Fed;Cup;Spoon    Nectar Thick Nectar Thick Liquid: Not tested   Honey Thick Honey Thick Liquid: Not tested   Puree Puree: Not tested   Solid     Solid: Not tested      Mahala Menghini., M.A. CCC-SLP Acute Rehabilitation Services Office [phone]  Secure chat preferred  05/20/2023,1:41 PM

## 2023-05-20 NOTE — ED Notes (Addendum)
 Multiple loose bowel movement with in 4 hours, provider paged

## 2023-05-20 NOTE — Progress Notes (Signed)
 Inpatient Rehabilitation Care Coordinator Discharge Note   Patient Details  Name: Brittany Mullins MRN: [id number] Date of Birth: 1980-05-28   Discharge location: D/c to home  Length of Stay: 16 days  Discharge activity level: Supervision with rollator  Home/community participation: Limited  Patient response BJ:YNWGNF Literacy - How often do you need to have someone help you when you read instructions, pamphlets, or other written material from your doctor or pharmacy?: Never  Patient response AO:ZHYQMV Isolation - How often do you feel lonely or isolated from those around you?: Never  Services provided included: MD, RD, PT, CM, TR, Pharmacy, Neuropsych, SW, RN, SLP, OT  Financial Services:  Financial Services Utilized: Medicare    Choices offered to/list presented to: patient  Follow-up services arranged:  Home Health, DME, Patient/Family has no preference for HH/DME agencies Home Health Agency: [facility] for HHPT/OT/SLP/aide    DME : [facility] for rollator, 3in1 BSC, and  TTB    Patient response to transportation need: Is the patient able to respond to transportation needs?: Yes In the past 12 months, has lack of transportation kept you from medical appointments or from getting medications?: No In the past 12 months, has lack of transportation kept you from meetings, work, or from getting things needed for daily living?: No   Patient/Family verbalized understanding of follow-up arrangements:  Yes  Individual responsible for coordination of the follow-up plan: contact pt [phone]  Confirmed correct DME delivered: Gretchen Short 05/20/2023    Comments (or additional information):fam edu completed.   Summary of Stay    Date/Time Discharge Planning CSW  05/14/23 0929 Pt will d/c to home with support with s/o [name]. He will work on hiring care for pt while he is at work. Fam edu completed. SW will ordered TTB, shower chair with back- will  confirm final d/c recs. SW will send PCS referral. SW will confirm there are no barriers to discharge. AAC  05/08/23 1326 Pt will d/c to home with support with s/o [name]. He will work on hiring care for pt while he is at work. SW will confirm there are no barriers to discharge. AAC       George Alcantar A Lula Olszewski

## 2023-05-20 NOTE — Discharge Planning (Signed)
 Pt currently active with [facility] for Home Health services PT and OT as confirmed by [facility] with [facility].  Pt will resume HH services at discharge. No DME needs identified at this time.

## 2023-05-20 NOTE — Progress Notes (Addendum)
 [facility] PROGRESS NOTE   Shakila Mak [facility] UJW:[id number] DOB: [birth year] DOA: 05/19/2023  PCP: Patient, No Pcp Per  Brief History: 43 y.o. 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  She also had multiple episodes of diarrhea.  Recently hospitalized for acute stroke and spent several days in rehab.  Was discharged on 05/18/2023.    Consultants: None  Procedures: None    Subjective/Interval History: Patient had several loose bowel movements overnight.  Denies any nausea this morning.  Also had episodes of vomiting over the last few days.  Continues to have diffuse abdominal pain.  She was told about her reassuring CT scan findings.    Assessment/Plan:  Sepsis/possible acute viral gastroenteritis/Lactic acidosis/multifocal pneumonia Patient presented with fever tachycardia lactic acidosis.  CT scan did not show any acute findings.  She was aggressively hydrated.  Lactic acid level improved. Procalcitonin noted to be elevated.  C. difficile panel is negative.  GI pathogen panel is pending.   Does have a cough.  CT scan of the chest did show areas of opacities raising concern for pneumonia.  She could have aspirated.  Oxygen saturations are normal.  Respiratory viral panel and COVID-19 testing was negative. Patient is on broad-spectrum antibiotics with vancomycin and cefepime.  Follow-up on blood cultures.  Will recheck procalcitonin levels. Follow-up on WBC.  Magnesium level was low.  Repeat levels are pending.  TSH is normal at 0.98.   She was also given stress dose steroid yesterday.  Started back on her usual dosage from today.  Hypokalemia and hypomagnesemia Repeat levels are  pending.  Intractable nausea and vomiting/chronic pain syndrome She has a chronic history of nausea vomiting and abdominal pain.  CT of the abdomen pelvis did not show any acute findings.   She is noted to be on methadone prior to admission which is being continued.  Recent acute stroke Stable.  She recently completed dual antiplatelet treatment.  Now supposed to be on just aspirin and statin.  Essential hypertension Blood pressure is noted to be low overnight.  Hold her antihypertensives.  Continue to monitor closely.  History of Addison's disease Continue her home medications.  She did get 100 mg of IV hydrocortisone on 2/16.  Blurry vision left eye No focal neurological deficits noted.  Recent stroke history is noted.  Outpatient ophthalmology evaluation.  Bipolar disorder Continue Abilify pain  Nonobstructive nephrolithiasis Incidentally noted on CT scan.  Dysphagia Patient reports difficulty swallowing.  Speech therapy consultation requested.  May need a esophageal gram.  Will first wait for SLP evaluation.  Seizure disorder Continue with Keppra and Lamictal.  Low protein S levels Apparently has been referred to hematology.   DVT Prophylaxis: Lovenox Code Status: Full code Family Communication: Discussed with patient and with her fianc over the phone Disposition Plan: Hopefully return home when improved  Status is: Observation The patient will require care spanning > 2 midnights and should be moved to inpatient because: Persistent diarrhea, nausea and vomiting, electrolyte abnormalities      Medications: Scheduled:  ARIPiprazole  5 mg Oral Daily   aspirin EC  81 mg Oral Daily   atorvastatin  40 mg Oral Daily   capsicum   Topical BID   enoxaparin (LOVENOX) injection  40 mg Subcutaneous Q24H  gabapentin  300 mg Oral BID   gabapentin  400 mg Oral QHS   hydrocortisone  10 mg Oral Q breakfast   And   hydrocortisone  5 mg Oral QAC supper   lamoTRIgine  25 mg  Oral BID   levETIRAcetam  500 mg Oral Q8H   methadone  120 mg Oral Daily   pantoprazole (PROTONIX) IV  40 mg Intravenous Q24H   polyvinyl alcohol  1 drop Both Eyes BID   Continuous:  ceFEPime (MAXIPIME) IV Stopped (05/20/23 0450)   lactated ringers Stopped (05/20/23 0710)   vancomycin     ZOX:[id number] **OR** acetaminophen, albuterol, cyclobenzaprine, hydrOXYzine, metoprolol tartrate, morphine injection, ondansetron (ZOFRAN) IV, traZODone  Antibiotics: Anti-infectives (From admission, onward)    Start     Dose/Rate Route Frequency Ordered Stop   05/20/23 1200  vancomycin (VANCOREADY) IVPB 1500 mg/300 mL        1,500 mg 150 mL/hr over 120 Minutes Intravenous Every 24 hours 05/19/23 1403     05/19/23 2000  ceFEPIme (MAXIPIME) 2 g in sodium chloride 0.9 % 100 mL IVPB        2 g 200 mL/hr over 30 Minutes Intravenous Every 8 hours 05/19/23 1359     05/19/23 1245  vancomycin (VANCOREADY) IVPB 1500 mg/300 mL        1,500 mg 150 mL/hr over 120 Minutes Intravenous  Once 05/19/23 1237 05/19/23 1523   05/19/23 1130  ceFEPIme (MAXIPIME) 2 g in sodium chloride 0.9 % 100 mL IVPB        2 g 200 mL/hr over 30 Minutes Intravenous  Once 05/19/23 1126 05/19/23 1215   05/19/23 1115  metroNIDAZOLE (FLAGYL) IVPB 500 mg        500 mg 100 mL/hr over 60 Minutes Intravenous  Once 05/19/23 1107 05/19/23 1243       Objective:  Vital Signs  Vitals:   05/20/23 0700 05/20/23 0715 05/20/23 0717 05/20/23 0730  BP: 121/83 108/68  112/86  Pulse: 83 88  81  Resp: 16 14  12   Temp:   98.2 F (36.8 C)   TempSrc:   Oral   SpO2: 99% 98%  98%  Weight:      Height:        Intake/Output Summary (Last 24 hours) at 05/20/2023 0843 Last data filed at 05/20/2023 0733 Gross per 24 hour  Intake 3188.49 ml  Output 330 ml  Net 2858.49 ml   Filed Weights   05/19/23 0519  Weight: 70.3 kg    General appearance: Awake alert.  In no distress Resp: Clear to auscultation bilaterally.  Normal effort Cardio:  S1-S2 is normal regular.  No S3-S4.  No rubs murmurs or bruit GI: Abdomen is soft.  Nonspecific tenderness appreciated without any rebound rigidity or guarding.  No masses organomegaly. Extremities: No edema.  Full range of motion of lower extremities. Neurologic: No focal neurological deficits.   Lab Results:  Data Reviewed: I have personally reviewed following labs and reports of the imaging studies  CBC: Recent Labs  Lab 05/19/23 0535  WBC 6.0  NEUTROABS 4.9  HGB 14.5  HCT 42.7  MCV 91.0  PLT 228    Basic Metabolic Panel: Recent Labs  Lab 05/16/23 0751 05/19/23 0535 05/19/23 1315  NA 136 136  --   K 3.8 3.1*  --   CL 99 99  --   CO2 26 21*  --   GLUCOSE 60* 109*  --   BUN 18 15  --  CREATININE 0.77 0.77  --   CALCIUM 8.8* 8.7*  --   MG  --   --  1.3*    GFR: Estimated Creatinine Clearance: 85.9 mL/min (by C-G formula based on SCr of 0.77 mg/dL).  Liver Function Tests: Recent Labs  Lab 05/19/23 0535  AST 31  ALT 23  ALKPHOS 37*  BILITOT 1.0  PROT 6.6  ALBUMIN 3.6    Recent Labs  Lab 05/19/23 1315  LIPASE 23   Coagulation Profile: Recent Labs  Lab 05/19/23 0535  INR 1.0    CBG: Recent Labs  Lab 05/15/23 2228 05/15/23 2251  GLUCAP 122* 102*    Thyroid Function Tests: Recent Labs    05/19/23 1745  TSH 0.982    Recent Results (from the past 240 hours)  Blood Culture (routine x 2)     Status: None (Preliminary result)   Collection Time: 05/19/23  5:30 AM   Specimen: BLOOD RIGHT HAND  Result Value Ref Range Status   Specimen Description BLOOD RIGHT HAND  Final   Special Requests   Final    BOTTLES DRAWN AEROBIC ONLY Blood Culture adequate volume   Culture   Final    NO GROWTH 1 DAY Performed at [facility], [address] [place], [place] [address]    Report Status PENDING  Incomplete  Blood Culture (routine x 2)     Status: None (Preliminary result)   Collection Time: 05/19/23  5:35 AM   Specimen: BLOOD  Result  Value Ref Range Status   Specimen Description BLOOD LEFT ANTECUBITAL  Final   Special Requests   Final    BOTTLES DRAWN AEROBIC AND ANAEROBIC Blood Culture adequate volume   Culture   Final    NO GROWTH 1 DAY Performed at [facility], [address], [place], [place] [address]    Report Status PENDING  Incomplete  Resp panel by RT-PCR (RSV, Flu A&B, Covid)     Status: None   Collection Time: 05/19/23  5:41 AM   Specimen: Nasal Swab  Result Value Ref Range Status   SARS Coronavirus 2 by RT PCR NEGATIVE NEGATIVE Final   Influenza A by PCR NEGATIVE NEGATIVE Final   Influenza B by PCR NEGATIVE NEGATIVE Final    Comment: (NOTE) The Xpert Xpress SARS-CoV-2/FLU/RSV plus assay is intended as an aid in the diagnosis of influenza from Nasopharyngeal swab specimens and should not be used as a sole basis for treatment. Nasal washings and aspirates are unacceptable for Xpert Xpress SARS-CoV-2/FLU/RSV testing.  Fact Sheet for Patients: BloggerCourse.com  Fact Sheet for Healthcare Providers: SeriousBroker.it  This test is not yet approved or cleared by the [place] FDA and has been authorized for detection and/or diagnosis of SARS-CoV-2 by FDA under an Emergency Use Authorization (EUA). This EUA will remain in effect (meaning this test can be used) for the duration of the COVID-19 declaration under Section 564(b)(1) of the Act, 21 U.S.C. section 360bbb-3(b)(1), unless the authorization is terminated or revoked.     Resp Syncytial Virus by PCR NEGATIVE NEGATIVE Final    Comment: (NOTE) Fact Sheet for Patients: BloggerCourse.com  Fact Sheet for Healthcare Providers: SeriousBroker.it  This test is not yet approved or cleared by the [place] FDA and has been authorized for detection and/or diagnosis of SARS-CoV-2 by FDA under an Emergency Use Authorization (EUA). This EUA  will remain in effect (meaning this test can be used) for the duration of the COVID-19 declaration under Section 564(b)(1)  of the Act, 21 U.S.C. section 360bbb-3(b)(1), unless the authorization is terminated or revoked.  Performed at [facility], [address], [place], [place] [address]   Respiratory (~20 pathogens) panel by PCR     Status: None   Collection Time: 05/19/23 11:53 AM   Specimen: Nasopharyngeal Swab; Respiratory  Result Value Ref Range Status   Adenovirus NOT DETECTED NOT DETECTED Final   Coronavirus 229E NOT DETECTED NOT DETECTED Final    Comment: (NOTE) The Coronavirus on the Respiratory Panel, DOES NOT test for the novel  Coronavirus (2019 nCoV)    Coronavirus HKU1 NOT DETECTED NOT DETECTED Final   Coronavirus NL63 NOT DETECTED NOT DETECTED Final   Coronavirus OC43 NOT DETECTED NOT DETECTED Final   Metapneumovirus NOT DETECTED NOT DETECTED Final   Rhinovirus / Enterovirus NOT DETECTED NOT DETECTED Final   Influenza A NOT DETECTED NOT DETECTED Final   Influenza B NOT DETECTED NOT DETECTED Final   Parainfluenza Virus 1 NOT DETECTED NOT DETECTED Final   Parainfluenza Virus 2 NOT DETECTED NOT DETECTED Final   Parainfluenza Virus 3 NOT DETECTED NOT DETECTED Final   Parainfluenza Virus 4 NOT DETECTED NOT DETECTED Final   Respiratory Syncytial Virus NOT DETECTED NOT DETECTED Final   Bordetella pertussis NOT DETECTED NOT DETECTED Final   Bordetella Parapertussis NOT DETECTED NOT DETECTED Final   Chlamydophila pneumoniae NOT DETECTED NOT DETECTED Final   Mycoplasma pneumoniae NOT DETECTED NOT DETECTED Final    Comment: Performed at [facility], [address], [place], [place] [address]  C Difficile Quick Screen w PCR reflex     Status: None   Collection Time: 05/20/23  4:04 AM   Specimen: Stool  Result Value Ref Range Status   C Diff antigen NEGATIVE NEGATIVE Final   C Diff toxin NEGATIVE NEGATIVE Final   C Diff interpretation No C. difficile  detected.  Final    Comment: Performed at [facility], [address], [place], [place] [address]      Radiology Studies: CT CHEST ABDOMEN PELVIS WO CONTRAST Result Date: 05/19/2023 CLINICAL DATA:  Sepsis. Recent stroke. Nausea, vomiting and diarrhea. Upper abdominal pain. EXAM: CT CHEST, ABDOMEN AND PELVIS WITHOUT CONTRAST TECHNIQUE: Multidetector CT imaging of the chest, abdomen and pelvis was performed following the standard protocol without IV contrast. RADIATION DOSE REDUCTION: This exam was performed according to the departmental dose-optimization program which includes automated exposure control, adjustment of the mA and/or kV according to patient size and/or use of iterative reconstruction technique. COMPARISON:  CT scan abdomen and pelvis from 08/17/2022 and CT scan chest from 12/22/2017. FINDINGS: CT CHEST FINDINGS Cardiovascular: Normal cardiac size. No pericardial effusion. No aortic aneurysm. 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 Mediastinum/Nodes: Visualized thyroid gland appears grossly unremarkable. No solid / cystic mediastinal masses. The esophagus is nondistended precluding optimal assessment. There are few mildly prominent mediastinal lymph nodes, which do not meet the size criteria for lymphadenopathy and appear grossly similar to the prior study, favoring benign etiology. No axillary lymphadenopathy by size criteria. Evaluation of bilateral hila is limited due to lack on intravenous contrast: however, no large hilar lymphadenopathy identified. There are few calcified left hilar lymph nodes, similar to the prior study and likely sequela of prior granulomatous infection. Lungs/Pleura: The central tracheo-bronchial tree is patent. There are multiple new small groupings of peribronchovascular solid and ground-glass opacity throughout bilateral  lungs with  asymmetric more involvement of middle lobe and right lower lobe. Findings are nonspecific and differential diagnosis includes multilobar pneumonia, aspiration, atypical pneumonia, cryptogenic organizing pneumonia, etc. No pleural effusion or pneumothorax. Musculoskeletal: The visualized soft tissues of the chest wall are grossly unremarkable. No suspicious osseous lesions. CT ABDOMEN PELVIS FINDINGS Hepatobiliary: The liver is normal in size. Non-cirrhotic configuration. No suspicious mass. No intrahepatic or extrahepatic bile duct dilation. Gallbladder is surgically absent. Pancreas: Unremarkable. No pancreatic ductal dilatation or surrounding inflammatory changes. Spleen: Normal in size. There are multiple sub-5 mm calcified granulomas in the spleen. No other focal lesion. Adrenals/Urinary Tract: Adrenal glands are unremarkable. No suspicious renal mass within the limitations of this unenhanced exam. There are at least 3, sub 3 mm nonobstructing calculi each in bilateral kidneys. No ureterolithiasis or obstructive uropathy on either side. Unremarkable urinary bladder. Stomach/Bowel: No disproportionate dilation of the small or large bowel loops. No evidence of abnormal bowel wall thickening or inflammatory changes. The appendix was not visualized; however there is no acute inflammatory process in the right lower quadrant. Vascular/Lymphatic: No ascites or pneumoperitoneum. No abdominal or pelvic lymphadenopathy, by size criteria. No aneurysmal dilation of the major abdominal arteries. Reproductive: The uterus is surgically absent. No large adnexal mass. Other: There is a tiny fat containing umbilical hernia. There are soft tissue density areas in the anterior abdominal wall subcutaneous tissue, most likely due to medication injections. Musculoskeletal: No suspicious osseous lesions. IMPRESSION: 1. There are multiple new small groupings of peribronchovascular solid and ground-glass opacities throughout bilateral  lungs, as described above. Findings are nonspecific and differential diagnosis includes multilobar pneumonia, aspiration, atypical pneumonia, cryptogenic organizing pneumonia, etc. Correlate clinically. 2. No acute inflammatory process identified within the abdomen or pelvis. 3. There are bilateral, sub 3 mm, nonobstructing renal calculi. No ureterolithiasis or obstructive uropathy. 4. Multiple other nonacute observations, as described above. Electronically Signed   By: Jules Schick M.D.   On: 05/19/2023 12:30   DG Chest 2 View Result Date: 05/19/2023 CLINICAL DATA:  43 year old female with possible sepsis. EXAM: CHEST - 2 VIEW COMPARISON:  Chest x-ray 04/22/2023. FINDINGS: Lung volumes are normal. Mild elevation of the right hemidiaphragm. No consolidative airspace disease. No pleural effusions. No pneumothorax. No pulmonary nodule or mass noted. Pulmonary vasculature and the cardiomediastinal silhouette are within normal limits. IMPRESSION: 1. No radiographic evidence of acute cardiopulmonary disease. Electronically Signed   By: Trudie Reed M.D.   On: 05/19/2023 06:11       LOS: 0 days   Aleana Fifita Rito Ehrlich  [facility] Pager on [email or web address]  05/20/2023, 8:43 AM

## 2023-05-20 NOTE — Care Management Obs Status (Signed)
 MEDICARE OBSERVATION STATUS NOTIFICATION   Patient Details  Name: Brittany Mullins MRN: [id number] Date of Birth: Apr 09, 1980   Medicare Observation Status Notification Given:  Yes    Oletta Cohn, RN 05/20/2023, 10:10 AM

## 2023-05-21 ENCOUNTER — Inpatient Hospital Stay (HOSPITAL_COMMUNITY): Payer: Medicare Other

## 2023-05-21 DIAGNOSIS — A419 Sepsis, unspecified organism: Secondary | ICD-10-CM | POA: Diagnosis not present

## 2023-05-21 DIAGNOSIS — R569 Unspecified convulsions: Secondary | ICD-10-CM

## 2023-05-21 DIAGNOSIS — F445 Conversion disorder with seizures or convulsions: Secondary | ICD-10-CM

## 2023-05-21 LAB — COMPREHENSIVE METABOLIC PANEL
ALT: 20 U/L (ref 0–44)
AST: 21 U/L (ref 15–41)
Albumin: 2.6 g/dL — ABNORMAL LOW (ref 3.5–5.0)
Alkaline Phosphatase: 24 U/L — ABNORMAL LOW (ref 38–126)
Anion gap: 12 (ref 5–15)
BUN: 12 mg/dL (ref 6–20)
CO2: 19 mmol/L — ABNORMAL LOW (ref 22–32)
Calcium: 8.3 mg/dL — ABNORMAL LOW (ref 8.9–10.3)
Chloride: 107 mmol/L (ref 98–111)
Creatinine, Ser: 0.72 mg/dL (ref 0.44–1.00)
GFR, Estimated: >60 mL/min (ref >60)
Glucose, Bld: 70 mg/dL (ref 70–99)
Potassium: 4.1 mmol/L (ref 3.5–5.1)
Sodium: 138 mmol/L (ref 135–145)
Total Bilirubin: 0.4 mg/dL (ref 0.0–1.2)
Total Protein: 5.3 g/dL — ABNORMAL LOW (ref 6.5–8.1)

## 2023-05-21 LAB — CBC
HCT: 37.3 % (ref 36.0–46.0)
Hemoglobin: 12.2 g/dL (ref 12.0–15.0)
MCH: 31 pg (ref 26.0–34.0)
MCHC: 32.7 g/dL (ref 30.0–36.0)
MCV: 94.9 fL (ref 80.0–100.0)
Platelets: 186 10*3/uL (ref 150–400)
RBC: 3.93 MIL/uL (ref 3.87–5.11)
RDW: 12.5 % (ref 11.5–15.5)
WBC: 4.7 10*3/uL (ref 4.0–10.5)
nRBC: 0 % (ref 0.0–0.2)

## 2023-05-21 LAB — URINE CULTURE: Culture: 30000 — AB

## 2023-05-21 LAB — PHOSPHORUS: Phosphorus: 2.4 mg/dL — ABNORMAL LOW (ref 2.5–4.6)

## 2023-05-21 LAB — MAGNESIUM: Magnesium: 2.2 mg/dL (ref 1.7–2.4)

## 2023-05-21 MED ORDER — MORPHINE SULFATE (PF) 2 MG/ML IV SOLN: 1.0000 mg | Freq: Three times a day (TID) | INTRAVENOUS | Status: DC | PRN

## 2023-05-21 MED ORDER — DOXYCYCLINE HYCLATE 100 MG PO TABS
100.0000 mg | ORAL_TABLET | Freq: Two times a day (BID) | ORAL | Status: DC
  Administered 2023-05-21 – 2023-05-22 (×3): 100 mg via ORAL
  Filled 2023-05-21 (×3): qty 1

## 2023-05-21 MED ORDER — LACTATED RINGERS IV SOLN: INTRAVENOUS | Status: DC

## 2023-05-21 MED ORDER — NALOXONE HCL 0.4 MG/ML IJ SOLN: 0.4000 mg | INTRAMUSCULAR | Status: DC | PRN

## 2023-05-21 MED ORDER — LEVETIRACETAM IN NACL 500 MG/100ML IV SOLN
500.0000 mg | Freq: Three times a day (TID) | INTRAVENOUS | Status: DC
  Administered 2023-05-21 – 2023-05-22 (×2): 500 mg via INTRAVENOUS
  Filled 2023-05-21 (×2): qty 100

## 2023-05-21 MED ORDER — HYDROCORTISONE SOD SUC (PF) 100 MG IJ SOLR
50.0000 mg | Freq: Two times a day (BID) | INTRAMUSCULAR | Status: DC
  Administered 2023-05-21 – 2023-05-22 (×3): 50 mg via INTRAVENOUS
  Filled 2023-05-21 (×3): qty 2

## 2023-05-21 MED ORDER — LEVETIRACETAM IN NACL 1000 MG/100ML IV SOLN
1000.0000 mg | INTRAVENOUS | Status: AC
Start: 2023-05-21 — End: 2023-05-21
  Administered 2023-05-21: 1000 mg via INTRAVENOUS
  Filled 2023-05-21: qty 100

## 2023-05-21 MED ORDER — METRONIDAZOLE 500 MG/100ML IV SOLN
500.0000 mg | Freq: Two times a day (BID) | INTRAVENOUS | Status: DC
  Administered 2023-05-21 (×2): 500 mg via INTRAVENOUS
  Filled 2023-05-21 (×2): qty 100

## 2023-05-21 MED ORDER — HYDRALAZINE HCL 20 MG/ML IJ SOLN: 10.0000 mg | Freq: Four times a day (QID) | INTRAMUSCULAR | Status: DC | PRN

## 2023-05-21 MED ORDER — LORAZEPAM 2 MG/ML IJ SOLN: 2.0000 mg | Freq: Four times a day (QID) | INTRAMUSCULAR | Status: DC | PRN

## 2023-05-21 NOTE — Evaluation (Signed)
 Physical Therapy Evaluation Patient Details Name: Brittany Mullins MRN: [id number] DOB: 07/24/1980 Today's Date: 05/21/2023  History of Present Illness  Pt is a 43 y.o. female admitted 05/19/23 with sepsis, N/V, and diarrhea. Recent admission 04/22/23 for L pontine infarct followed by CIR stay. Discharged home from [facility] 05/18/23. PMH: Addison's disease, brain tumor (benign), HTN, marijuana use, pancreatitis, PCOS (polycystic ovarian syndrome), pituitary tumor, and seizures.   Clinical Impression  Pt admitted with above diagnosis. PTA pt lived in first floor handicap accessible apt with her fiance, supervision level with rollator and active with HHPT. Pt currently with functional limitations due to the deficits listed below (see PT Problem List). On eval, pt demo mod I bed mobility. Supervision transfers and ambulation 250' with rollator. Pt will benefit from acute skilled PT to increase their independence and safety with mobility to allow discharge. Recommend HHPT upon d/c.         If plan is discharge home, recommend the following: Assist for transportation;Assistance with cooking/housework;A little help with bathing/dressing/bathroom   Can travel by private vehicle        Equipment Recommendations None recommended by PT (Rollator ordered upon d/c from CIR but has not been delivered.)  Recommendations for Other Services       Functional Status Assessment Patient has had a recent decline in their functional status and demonstrates the ability to make significant improvements in function in a reasonable and predictable amount of time.     Precautions / Restrictions Precautions Precautions: Fall      Mobility  Bed Mobility Overal bed mobility: Modified Independent                  Transfers Overall transfer level: Needs assistance Equipment used: Ambulation equipment used Transfers: Sit to/from Stand Sit to Stand: Supervision                 Ambulation/Gait Ambulation/Gait assistance: Supervision Gait Distance (Feet): 250 Feet Assistive device: Rollator (4 wheels) Gait Pattern/deviations: Step-through pattern, Decreased stride length, Decreased step length - right, Decreased weight shift to right Gait velocity: mildly decreased Gait velocity interpretation: 1.31 - 2.62 ft/sec, indicative of limited community ambulator   General Gait Details: steady gait with rollator  Stairs            Wheelchair Mobility     Tilt Bed    Modified Rankin (Stroke Patients Only)       Balance Overall balance assessment: Needs assistance Sitting-balance support: No upper extremity supported, Feet supported Sitting balance-Leahy Scale: Good     Standing balance support: Bilateral upper extremity supported, During functional activity, Reliant on assistive device for balance Standing balance-Leahy Scale: Poor                               Pertinent Vitals/Pain Pain Assessment Pain Assessment: No/denies pain    Home Living Family/patient expects to be discharged to:: Private residence Living Arrangements: Spouse/significant other Available Help at Discharge: Family;Available PRN/intermittently Type of Home: Apartment Home Access: Level entry       Home Layout: One level Home Equipment: Tub bench;BSC/3in1 Additional Comments: Rollator has been ordered but not yet delivered.    Prior Function Prior Level of Function : Needs assist             Mobility Comments: independent prior to Jan 2025 stroke. Discharged from CIR 2/15 at supervision level using rollator.       Extremity/Trunk Assessment  Upper Extremity Assessment Upper Extremity Assessment: Right hand dominant;RUE deficits/detail RUE Deficits / Details: residual deficits from L pontine stroke    Lower Extremity Assessment Lower Extremity Assessment: RLE deficits/detail RLE Deficits / Details: residual deficits from L pontine stroke     Cervical / Trunk Assessment Cervical / Trunk Assessment: Normal  Communication   Communication Communication: No apparent difficulties    Cognition Arousal: Alert Behavior During Therapy: WFL for tasks assessed/performed   PT - Cognitive impairments: No apparent impairments                                 Cueing       General Comments General comments (skin integrity, edema, etc.): VSS on RA    Exercises     Assessment/Plan    PT Assessment Patient needs continued PT services  PT Problem List Decreased balance;Decreased mobility;Decreased coordination       PT Treatment Interventions Functional mobility training;Balance training;Patient/family education;Gait training;Therapeutic activities;Therapeutic exercise    PT Goals (Current goals can be found in the Care Plan section)  Acute Rehab PT Goals Patient Stated Goal: home PT Goal Formulation: With patient Time For Goal Achievement: 06/04/23 Potential to Achieve Goals: Good    Frequency Min 1X/week     Co-evaluation               AM-PAC PT "6 Clicks" Mobility  Outcome Measure Help needed turning from your back to your side while in a flat bed without using bedrails?: None Help needed moving from lying on your back to sitting on the side of a flat bed without using bedrails?: None Help needed moving to and from a bed to a chair (including a wheelchair)?: A Little Help needed standing up from a chair using your arms (e.g., wheelchair or bedside chair)?: A Little Help needed to walk in hospital room?: A Little Help needed climbing 3-5 steps with a railing? : A Lot 6 Click Score: 19    End of Session Equipment Utilized During Treatment: Gait belt Activity Tolerance: Patient tolerated treatment well Patient left: in bed;with call bell/phone within reach Nurse Communication: Mobility status PT Visit Diagnosis: Difficulty in walking, not elsewhere classified (R26.2)    Time: 2595-6387 PT  Time Calculation (min) (ACUTE ONLY): 22 min   Charges:   PT Evaluation $PT Eval Low Complexity: 1 Low   PT General Charges $$ ACUTE PT VISIT: 1 Visit         Ferd Glassing., PT  Office # [phone]   Ilda Foil 05/21/2023, 12:08 PM

## 2023-05-21 NOTE — Progress Notes (Signed)
 PROGRESS NOTE                                                                                                                                                                                                             Patient Demographics:    Brittany Mullins, is a 43 y.o. female, DOB - [birth year], ZOX:[id number]  Outpatient Primary MD for the patient is Patient, No Pcp Per    LOS - 1  Admit date - 05/19/2023    Chief Complaint  Patient presents with   Emesis       Brief Narrative (HPI from H&P)   43 y.o. 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  She also had multiple episodes of diarrhea.  Recently hospitalized for acute stroke and spent several days in rehab.  Was discharged on 05/18/2023.     Subjective:    Brittany Mullins today has, No headache, No chest pain, improved  abdominal pain - No Nausea, No new weakness tingling or numbness, no cough or SOB   Assessment  & Plan :    Sepsis/possible acute viral gastroenteritis/Lactic acidosis/multifocal pneumonia - Vomiting and diarrhea has resolved, CT abdomen pelvis nonacute, appears nontoxic, will taper down antibiotics to cover for atypical pneumonia and gastroenteritis, will place her on combination of doxycycline, cefepime and Flagyl.  Urine cultures noted with insignificant growth.  Patient is currently symptom-free, no reported diarrhea the last 14 hours, C. difficile panel was negative, currently no cough or shortness of breath.     Intractable nausea and vomiting/chronic pain syndrome  She has a chronic history of nausea vomiting and abdominal pain.  CT of the abdomen pelvis did not show any acute  findings.  Currently on bowel rest with liquid diet, nausea vomiting has resolved, chronically takes high-dose narcotics, continue to monitor with supportive care.    Recent acute stroke  Stable.  She recently completed dual antiplatelet treatment.  Now supposed to be on just aspirin and statin.   Essential hypertension Blood pressure is noted to be low overnight.  Hold her antihypertensives.  Kindly see below.  History of Addison's disease - she  is chronically on steroids, blood pressure soft, gentle IV fluids on 05/21/2023 along with IV hydrocortisone.   Blurry vision left eye  No focal neurological deficits noted.  Recent stroke history is noted.  Outpatient ophthalmology evaluation.   Bipolar disorder  Continue Abilify pain   Nonobstructive nephrolithiasis  Incidentally noted on CT scan.   Dysphagia - Patient reports difficulty swallowing.  Seen and cleared by speech.   Seizure disorder - Continue with Keppra and Lamictal.   Low protein S levels  - Apparently has been referred to hematology.        Condition - Fair  Family Communication  :  None  Code Status :  Full  Consults  :  None  PUD Prophylaxis : PPI   Procedures  :     CT Abd Pelvis - 1. There are multiple new small groupings of peribronchovascular solid and ground-glass opacities throughout bilateral lungs, as described above. Findings are nonspecific and differential diagnosis includes multilobar pneumonia, aspiration, atypical pneumonia, cryptogenic organizing pneumonia, etc. Correlate clinically. 2. No acute inflammatory process identified within the abdomen or pelvis. 3. There are bilateral, sub 3 mm, nonobstructing renal calculi. No ureterolithiasis or obstructive uropathy. 4. Multiple other nonacute observations, as described above      Disposition Plan  :    Status is: Inpatient  DVT Prophylaxis  :    enoxaparin (LOVENOX) injection 40 mg Start: 05/19/23 2200   Lab Results  Component Value Date   PLT  186 05/21/2023    Diet :  Diet Order             Diet clear liquid Room service appropriate? Yes; Fluid consistency: Thin  Diet effective now                    Inpatient Medications  Scheduled Meds:  ARIPiprazole  5 mg Oral Daily   aspirin EC  81 mg Oral Daily   atorvastatin  40 mg Oral Daily   capsicum   Topical BID   doxycycline  100 mg Oral Q12H   enoxaparin (LOVENOX) injection  40 mg Subcutaneous Q24H   gabapentin  300 mg Oral BID   gabapentin  400 mg Oral QHS   hydrocortisone sod succinate (SOLU-CORTEF) inj  50 mg Intravenous Q12H   lamoTRIgine  25 mg Oral BID   levETIRAcetam  500 mg Oral Q8H   methadone  120 mg Oral Daily   pantoprazole (PROTONIX) IV  40 mg Intravenous Q24H   polyvinyl alcohol  1 drop Both Eyes BID   Continuous Infusions:  ceFEPime (MAXIPIME) IV 2 g (05/21/23 0517)   metronidazole     PRN Meds:.acetaminophen **OR** acetaminophen, albuterol, cyclobenzaprine, hydrOXYzine, metoprolol tartrate, morphine injection, ondansetron (ZOFRAN) IV, traZODone    Objective:   Vitals:   05/21/23 0400 05/21/23 0515 05/21/23 0600 05/21/23 0752  BP:  (!) 94/56  109/65  Pulse: 68 70 71 67  Resp: 17 10 13 11   Temp:  98.1 F (36.7 C)  (!) 97.4 F (36.3 C)  TempSrc:  Oral  Oral  SpO2: 92% 95% 92% 96%  Weight:      Height:        Wt Readings from Last 3 Encounters:  05/19/23 70.3 kg  05/03/23 70.3 kg  04/25/23 68 kg     Intake/Output Summary (Last 24 hours) at 05/21/2023 1053 Last data filed at 05/21/2023 1000 Gross per 24 hour  Intake 1070 ml  Output 4 ml  Net 1066 ml  Physical Exam  Awake Alert, No new F.N deficits, Normal affect [place].AT,PERRAL Supple Neck, No JVD,   Symmetrical Chest wall movement, Good air movement bilaterally, CTAB RRR,No Gallops,Rubs or new Murmurs,  +ve B.Sounds, Abd Soft, No tenderness,   No Cyanosis, Clubbing or edema       Data Review:    Recent Labs  Lab 05/19/23 0535 05/20/23 1220 05/21/23 0452  WBC  6.0 6.7 4.7  HGB 14.5 13.6 12.2  HCT 42.7 40.8 37.3  PLT 228 221 186  MCV 91.0 92.3 94.9  MCH 30.9 30.8 31.0  MCHC 34.0 33.3 32.7  RDW 12.4 12.5 12.5  LYMPHSABS 0.6*  --   --   MONOABS 0.4  --   --   EOSABS 0.0  --   --   BASOSABS 0.0  --   --     Recent Labs  Lab 05/16/23 0751 05/19/23 0535 05/19/23 0543 05/19/23 1016 05/19/23 1229 05/19/23 1315 05/19/23 1745 05/20/23 1220 05/21/23 0452  NA 136 136  --   --   --   --   --  134* 138  K 3.8 3.1*  --   --   --   --   --  3.5 4.1  CL 99 99  --   --   --   --   --  100 107  CO2 26 21*  --   --   --   --   --  24 19*  ANIONGAP 11 16*  --   --   --   --   --  10 12  GLUCOSE 60* 109*  --   --   --   --   --  93 70  BUN 18 15  --   --   --   --   --  12 12  CREATININE 0.77 0.77  --   --   --   --   --  0.74 0.72  AST  --  31  --   --   --   --   --  28 21  ALT  --  23  --   --   --   --   --  23 20  ALKPHOS  --  37*  --   --   --   --   --  29* 24*  BILITOT  --  1.0  --   --   --   --   --  0.5 0.4  ALBUMIN  --  3.6  --   --   --   --   --  2.9* 2.6*  PROCALCITON  --   --   --   --   --  6.25  --  6.36  --   LATICACIDVEN  --   --  2.7* 2.2* 3.0*  --  2.8*  --   --   INR  --  1.0  --   --   --   --   --   --   --   TSH  --   --   --   --   --   --  0.982  --   --   MG  --   --   --   --   --  1.3*  --  2.2 2.2  CALCIUM 8.8* 8.7*  --   --   --   --   --  8.3* 8.3*  Recent Labs  Lab 05/16/23 0751 05/19/23 0535 05/19/23 0543 05/19/23 1016 05/19/23 1229 05/19/23 1315 05/19/23 1745 05/20/23 1220 05/21/23 0452  PROCALCITON  --   --   --   --   --  6.25  --  6.36  --   LATICACIDVEN  --   --  2.7* 2.2* 3.0*  --  2.8*  --   --   INR  --  1.0  --   --   --   --   --   --   --   TSH  --   --   --   --   --   --  0.982  --   --   MG  --   --   --   --   --  1.3*  --  2.2 2.2  CALCIUM 8.8* 8.7*  --   --   --   --   --  8.3* 8.3*     --------------------------------------------------------------------------------------------------------------- Lab Results  Component Value Date   CHOL 153 04/23/2023   HDL 43 04/23/2023   LDLCALC 94 04/23/2023   TRIG 82 04/23/2023   CHOLHDL 3.6 04/23/2023    Lab Results  Component Value Date   HGBA1C 5.0 04/23/2023   Recent Labs    05/19/23 1745  TSH 0.982   No results for input(s): "VITAMINB12", "FOLATE", "FERRITIN", "TIBC", "IRON", "RETICCTPCT" in the last 72 hours. ------------------------------------------------------------------------------------------------------------------ Cardiac Enzymes No results for input(s): "CKMB", "TROPONINI", "MYOGLOBIN" in the last 168 hours.  Invalid input(s): "CK"  Radiology Report DG Chest Port 1 View Result Date: 05/21/2023 CLINICAL DATA:  Shortness of breath. EXAM: PORTABLE CHEST 1 VIEW COMPARISON:  05/19/2023 FINDINGS: The lungs are clear without focal pneumonia, edema, pneumothorax or pleural effusion. The cardiopericardial silhouette is within normal limits for size. No acute bony abnormality. Telemetry leads overlie the chest. IMPRESSION: No active disease. The peribronchovascular micro nodularity seen on CT imaging 2 days ago is not evident by x-ray. Electronically Signed   By: Kennith Center M.D.   On: 05/21/2023 06:39   DG Abd Portable 1V Result Date: 05/21/2023 CLINICAL DATA:  Abdominal pain.  Constipation. EXAM: PORTABLE ABDOMEN - 1 VIEW COMPARISON:  08/18/2021 FINDINGS: Gas is seen scattered through nondilated small bowel and colon. No substantial stool volume. Surgical clips in the right upper quadrant suggest prior cholecystectomy. Visualized bony anatomy is without acute findings. IMPRESSION: Nonobstructive bowel gas pattern.  No substantial stool volume. Electronically Signed   By: Kennith Center M.D.   On: 05/21/2023 06:38   CT CHEST ABDOMEN PELVIS WO CONTRAST Result Date: 05/19/2023 CLINICAL DATA:  Sepsis. Recent stroke.  Nausea, vomiting and diarrhea. Upper abdominal pain. EXAM: CT CHEST, ABDOMEN AND PELVIS WITHOUT CONTRAST TECHNIQUE: Multidetector CT imaging of the chest, abdomen and pelvis was performed following the standard protocol without IV contrast. RADIATION DOSE REDUCTION: This exam was performed according to the departmental dose-optimization program which includes automated exposure control, adjustment of the mA and/or kV according to patient size and/or use of iterative reconstruction technique. COMPARISON:  CT scan abdomen and pelvis from 08/17/2022 and CT scan chest from 12/22/2017. FINDINGS: CT CHEST FINDINGS Cardiovascular: Normal cardiac size. No pericardial effusion. No aortic aneurysm. 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 Mediastinum/Nodes: Visualized thyroid gland appears grossly unremarkable. No solid / cystic mediastinal masses. The esophagus is nondistended precluding optimal assessment. There are few mildly prominent  mediastinal lymph nodes, which do not meet the size criteria for lymphadenopathy and appear grossly similar to the prior study, favoring benign etiology. No axillary lymphadenopathy by size criteria. Evaluation of bilateral hila is limited due to lack on intravenous contrast: however, no large hilar lymphadenopathy identified. There are few calcified left hilar lymph nodes, similar to the prior study and likely sequela of prior granulomatous infection. Lungs/Pleura: The central tracheo-bronchial tree is patent. There are multiple new small groupings of peribronchovascular solid and ground-glass opacity throughout bilateral lungs with asymmetric more involvement of middle lobe and right lower lobe. Findings are nonspecific and differential diagnosis includes multilobar pneumonia, aspiration, atypical pneumonia, cryptogenic organizing pneumonia, etc. No pleural effusion or  pneumothorax. Musculoskeletal: The visualized soft tissues of the chest wall are grossly unremarkable. No suspicious osseous lesions. CT ABDOMEN PELVIS FINDINGS Hepatobiliary: The liver is normal in size. Non-cirrhotic configuration. No suspicious mass. No intrahepatic or extrahepatic bile duct dilation. Gallbladder is surgically absent. Pancreas: Unremarkable. No pancreatic ductal dilatation or surrounding inflammatory changes. Spleen: Normal in size. There are multiple sub-5 mm calcified granulomas in the spleen. No other focal lesion. Adrenals/Urinary Tract: Adrenal glands are unremarkable. No suspicious renal mass within the limitations of this unenhanced exam. There are at least 3, sub 3 mm nonobstructing calculi each in bilateral kidneys. No ureterolithiasis or obstructive uropathy on either side. Unremarkable urinary bladder. Stomach/Bowel: No disproportionate dilation of the small or large bowel loops. No evidence of abnormal bowel wall thickening or inflammatory changes. The appendix was not visualized; however there is no acute inflammatory process in the right lower quadrant. Vascular/Lymphatic: No ascites or pneumoperitoneum. No abdominal or pelvic lymphadenopathy, by size criteria. No aneurysmal dilation of the major abdominal arteries. Reproductive: The uterus is surgically absent. No large adnexal mass. Other: There is a tiny fat containing umbilical hernia. There are soft tissue density areas in the anterior abdominal wall subcutaneous tissue, most likely due to medication injections. Musculoskeletal: No suspicious osseous lesions. IMPRESSION: 1. There are multiple new small groupings of peribronchovascular solid and ground-glass opacities throughout bilateral lungs, as described above. Findings are nonspecific and differential diagnosis includes multilobar pneumonia, aspiration, atypical pneumonia, cryptogenic organizing pneumonia, etc. Correlate clinically. 2. No acute inflammatory process  identified within the abdomen or pelvis. 3. There are bilateral, sub 3 mm, nonobstructing renal calculi. No ureterolithiasis or obstructive uropathy. 4. Multiple other nonacute observations, as described above. Electronically Signed   By: Jules Schick M.D.   On: 05/19/2023 12:30     Signature  -   Susa Raring M.D on 05/21/2023 at 10:53 AM   -  To page go to [email or web address]

## 2023-05-21 NOTE — Progress Notes (Signed)
EEG complete - results pending.  NW/GMD 

## 2023-05-21 NOTE — Progress Notes (Signed)
 Chaplain provided information for AD to Pt. Pt received information and requested some time to fill the form. Chaplain asked Pt to notify this office when the form is ready for signing and notarization in front of witnesses.  Oneida Alar Chaplain Resident    05/21/23 1012  Spiritual Encounters  Type of Visit Initial  Care provided to: Patient  Referral source Clinical staff  Reason for visit Advance directives  OnCall Visit No

## 2023-05-21 NOTE — Procedures (Signed)
 Patient Name: Brittany Mullins  MRN: [id number]  Epilepsy Attending: Charlsie Quest  Referring Physician/Provider: Leroy Sea, MD  Date: 05/21/2023 Duration: 24.15 mins  Patient history: 43yo f with h/o epilepsy getting eeg to evaluate for seizure  Level of alertness: Awake, asleep  AEDs during EEG study: LEV, LTG, GBP  Technical aspects: This EEG study was done with scalp electrodes positioned according to the 10-20 International system of electrode placement. Electrical activity was reviewed with band pass filter of 1-70Hz , sensitivity of 7 uV/mm, display speed of 63mm/sec with a 60Hz  notched filter applied as appropriate. EEG data were recorded continuously and digitally stored.  Video monitoring was available and reviewed as appropriate.  Description: The posterior dominant rhythm consists of 8-9 Hz activity of moderate voltage (25-35 uV) seen predominantly in posterior head regions, symmetric and reactive to eye opening and eye closing. Sleep was characterized by vertex waves, sleep spindles (12 to 14 Hz), maximal frontocentral region. Hyperventilation and photic stimulation were not performed.     IMPRESSION: This study is within normal limits. No seizures or epileptiform discharges were seen throughout the recording.  A normal interictal EEG does not exclude the diagnosis of epilepsy.  Colden Samaras Annabelle Harman

## 2023-05-21 NOTE — Consult Note (Addendum)
 NEUROLOGY CONSULT NOTE   Date of service: May 21, 2023 Patient Name: Brittany Mullins MRN:  [id number] DOB:  25-May-1980 Chief Complaint: "concern for seizures" Requesting Provider: Leroy Sea, MD  History of Present Illness  Brittany Mullins is a 43 y.o. 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 She was found to have sepsis vs possible acute viral gastroenteritis vs multifocal pneumonia.  She was not responding to RN for a minute today and reported that she felt like a seizure was coming. She then was in the chair with eyes closed for a minute and not responding to questions. She was back to her baseline immediately afterwards. Neurology consulted for evaluation for potential seizures vs subclinical seizure.  She does not have recollection of this event.  Of note, she was evaluated by [place] just 2 weeks ago for similar episodes. She was put on cEEG and we were able to capture 3 PNES. EEG was otherwise normal with no epileptiform discharges.   ROS  Comprehensive ROS performed and pertinent positives documented in HPI   Past History   Past Medical History:  Diagnosis Date   Addison's disease (HCC)    Brain tumor (benign) (HCC)    Chronic pancreatitis (HCC)    Hearing loss    Hypertension    Marijuana use    Per pt: "medical marijuana patient"   Pancreatitis    PCOS (polycystic ovarian syndrome)    Pituitary tumor    Seizures (HCC)     Past Surgical History:  Procedure Laterality Date   ABDOMINAL HYSTERECTOMY     APPENDECTOMY     BIOPSY  08/16/2021   Procedure: BIOPSY;  Surgeon: Shellia Cleverly, DO;  Location: [facility];  Service: Gastroenterology;;   CESAREAN SECTION     CHOLECYSTECTOMY     ELBOW SURGERY     ESOPHAGOGASTRODUODENOSCOPY (EGD) WITH PROPOFOL N/A  08/16/2021   Procedure: ESOPHAGOGASTRODUODENOSCOPY (EGD) WITH PROPOFOL;  Surgeon: Shellia Cleverly, DO;  Location: [facility];  Service: Gastroenterology;  Laterality: N/A;   kidney stent     pancreatic stent     RADIOLOGY WITH ANESTHESIA N/A 04/25/2023   Procedure: MRI of Brain without contrast;  Surgeon: Radiologist, Medication, MD;  Location: [facility];  Service: Radiology;  Laterality: N/A;   RADIOLOGY WITH ANESTHESIA N/A 05/03/2023   Procedure: MRI WITH ANESTHESIA CERVICAL SPINE WITHOUT CONTRAST;  Surgeon: Radiologist, Medication, MD;  Location: [facility];  Service: Radiology;  Laterality: N/A;    Family History: Family History  Problem Relation Age of Onset   Cancer Mother    Hypertension Mother    Diabetes Maternal Grandmother    Cancer Maternal Grandfather    Diabetes Maternal Grandfather     Social History  reports that she has never smoked. She has never used smokeless tobacco. She reports that she does not currently use drugs after having used the following drugs: Marijuana. She reports that she does not drink alcohol.  Allergies  Allergen Reactions   Bee Venom Anaphylaxis   Iodinated Contrast Media Anaphylaxis   Other Anaphylaxis    CT dye   Toradol [Ketorolac Tromethamine] Shortness Of Breath   Benadryl [Diphenhydramine] Other (See Comments)    States she cannot breathe    Medications   Current Facility-Administered Medications:    acetaminophen (TYLENOL) tablet 650 mg, 650 mg, Oral, Q6H PRN, 650 mg at 05/21/23  8657 **OR** acetaminophen (TYLENOL) suppository 650 mg, 650 mg, Rectal, Q6H PRN, Orland Mustard, MD   albuterol (PROVENTIL) (2.5 MG/3ML) 0.083% nebulizer solution 2.5 mg, 2.5 mg, Nebulization, Q6H PRN, Orland Mustard, MD   ARIPiprazole (ABILIFY) tablet 5 mg, 5 mg, Oral, Daily, Orland Mustard, MD, 5 mg at 05/21/23 8469   aspirin EC tablet 81 mg, 81 mg, Oral, Daily, Orland Mustard, MD, 81 mg at 05/21/23 0846   atorvastatin (LIPITOR) tablet 40 mg, 40 mg, Oral,  Daily, Orland Mustard, MD, 40 mg at 05/21/23 0845   capsicum (ZOSTRIX) 0.075 % cream, , Topical, BID, Osvaldo Shipper, MD, Given at 05/21/23 0850   ceFEPIme (MAXIPIME) 2 g in sodium chloride 0.9 % 100 mL IVPB, 2 g, Intravenous, Q8H, Orland Mustard, MD, Last Rate: 200 mL/hr at 05/21/23 1230, 2 g at 05/21/23 1230   doxycycline (VIBRA-TABS) tablet 100 mg, 100 mg, Oral, Q12H, Leroy Sea, MD, 100 mg at 05/21/23 1230   enoxaparin (LOVENOX) injection 40 mg, 40 mg, Subcutaneous, Q24H, Orland Mustard, MD, 40 mg at 05/20/23 2138   gabapentin (NEURONTIN) capsule 300 mg, 300 mg, Oral, BID, Orland Mustard, MD, 300 mg at 05/21/23 1230   hydrALAZINE (APRESOLINE) injection 10 mg, 10 mg, Intravenous, Q6H PRN, Leroy Sea, MD   hydrocortisone sodium succinate (SOLU-CORTEF) 100 MG injection 50 mg, 50 mg, Intravenous, Q12H, Leroy Sea, MD, 50 mg at 05/21/23 6295   lamoTRIgine (LAMICTAL) tablet 25 mg, 25 mg, Oral, BID, Orland Mustard, MD, 25 mg at 05/21/23 0844   levETIRAcetam (KEPPRA) IVPB 1000 mg/100 mL premix, 1,000 mg, Intravenous, NOW, Thedore Mins, Stanford Scotland, MD   levETIRAcetam (KEPPRA) IVPB 500 mg/100 mL premix, 500 mg, Intravenous, Q8H, Singh, Stanford Scotland, MD   LORazepam (ATIVAN) injection 2 mg, 2 mg, Intravenous, Q6H PRN, Leroy Sea, MD   methadone (DOLOPHINE) tablet 120 mg, 120 mg, Oral, Daily, Orland Mustard, MD, 120 mg at 05/21/23 0845   metoprolol tartrate (LOPRESSOR) injection 5 mg, 5 mg, Intravenous, Q6H PRN, Orland Mustard, MD   metroNIDAZOLE (FLAGYL) IVPB 500 mg, 500 mg, Intravenous, Q12H, Leroy Sea, MD, Last Rate: 100 mL/hr at 05/21/23 1229, 500 mg at 05/21/23 1229   naloxone (NARCAN) injection 0.4 mg, 0.4 mg, Intravenous, PRN, Leroy Sea, MD   ondansetron [facility]) injection 4 mg, 4 mg, Intravenous, Q6H PRN, Orland Mustard, MD, 4 mg at 05/19/23 2028   pantoprazole (PROTONIX) injection 40 mg, 40 mg, Intravenous, Q24H, Orland Mustard, MD, 40 mg at 05/20/23 1514    polyvinyl alcohol (LIQUIFILM TEARS) 1.4 % ophthalmic solution 1 drop, 1 drop, Both Eyes, BID, Orland Mustard, MD   traZODone (DESYREL) tablet 50 mg, 50 mg, Oral, QHS PRN, Orland Mustard, MD, 50 mg at 05/20/23 2137  Vitals   Vitals:   05/21/23 0752 05/21/23 1141 05/21/23 1408 05/21/23 1417  BP: 109/65 121/76  (!) 160/138  Pulse: 67 85  86  Resp: 11 11  13   Temp: (!) 97.4 F (36.3 C) 98.4 F (36.9 C) 98.2 F (36.8 C)   TempSrc: Oral Oral Oral   SpO2: 96% 97%    Weight:      Height:        Body mass index is 28.35 kg/m.  Physical Exam   General: Laying comfortably in bed; in no acute distress.  HENT: Normal oropharynx and mucosa. Normal external appearance of ears and nose.  Neck: Supple, no pain or tenderness  CV: No JVD. No peripheral edema.  Pulmonary: Symmetric Chest rise. Normal respiratory effort.  Abdomen:  Soft to touch, non-tender.  Ext: No cyanosis, edema, or deformity  Skin: No rash. Normal palpation of skin.   Musculoskeletal: Normal digits and nails by inspection. No clubbing.   Neurologic Examination  Mental status/Cognition: Alert, oriented to self, place, month and year, good attention.  Speech/language: Fluent, comprehension intact, object naming intact, repetition intact.  Cranial nerves:   CN II Pupils equal and reactive to light, does not count fingers hled up in R hemivisual field today, this is different from last time when I saw her when she was unable to count fingers in L hemivisual field.   CN III,IV,VI EOM intact, no gaze preference or deviation, no nystagmus    CN V normal sensation in V1, V2, and V3 segments bilaterally    CN VII no asymmetry, no nasolabial fold flattening    CN VIII normal hearing to speech    CN IX & X normal palatal elevation, no uvular deviation    CN XI 5/5 head turn and 5/5 shoulder shrug bilaterally    CN XII midline tongue protrusion    Motor:  Muscle bulk: normal, tone normal, pronator drift none tremor none Mvmt Root  Nerve  Muscle Right Left Comments  SA C5/6 Ax Deltoid 5 5   EF C5/6 Mc Biceps 5 5   EE C6/7/8 Rad Triceps 5 5   WF C6/7 Med FCR     WE C7/8 PIN ECU     F Ab C8/T1 U ADM/FDI 4+ 5   HF L1/2/3 Fem Illopsoas 5 5   KE L2/3/4 Fem Quad 5 5   DF L4/5 D Peron Tib Ant 5 5   PF S1/2 Tibial Grc/Sol 4+ 5     Sensation:  Light touch Slightly decreased in RUE   Pin prick    Temperature    Vibration   Proprioception    Coordination/Complex Motor:  - Finger to Nose intact BL - Heel to shin intact BL - Rapid alternating movement are normal - Gait: deferred.  Labs/Imaging/Neurodiagnostic studies   CBC:  Recent Labs  Lab 05/30/23 0535 05/20/23 1220 05/21/23 0452  WBC 6.0 6.7 4.7  NEUTROABS 4.9  --   --   HGB 14.5 13.6 12.2  HCT 42.7 40.8 37.3  MCV 91.0 92.3 94.9  PLT 228 221 186   Basic Metabolic Panel:  Lab Results  Component Value Date   NA 138 05/21/2023   K 4.1 05/21/2023   CO2 19 (L) 05/21/2023   GLUCOSE 70 05/21/2023   BUN 12 05/21/2023   CREATININE 0.72 05/21/2023   CALCIUM 8.3 (L) 05/21/2023   GFRNONAA >60 05/21/2023   GFRAA >60 10/27/2019   Lipid Panel:  Lab Results  Component Value Date   LDLCALC 94 04/23/2023   HgbA1c:  Lab Results  Component Value Date   HGBA1C 5.0 04/23/2023   Urine Drug Screen:     Component Value Date/Time   LABOPIA NONE DETECTED 05/30/2023 0912   COCAINSCRNUR NONE DETECTED 05-30-23 0912   COCAINSCRNUR Negative 05/06/2023 0500   LABBENZ NONE DETECTED 05-30-2023 0912   AMPHETMU NONE DETECTED 2023/05/30 0912   THCU POSITIVE (A) 05-30-2023 0912   LABBARB NONE DETECTED May 30, 2023 0912    Alcohol Level     Component Value Date/Time   ETH <10 04/22/2023 0752   INR  Lab Results  Component Value Date   INR 1.0 05-30-2023   APTT No results found for: "APTT" AED levels: No results found for: "PHENYTOIN", "ZONISAMIDE", "LAMOTRIGINE", "LEVETIRACETA"  Neurodiagnostics rEEG:  pending  ASSESSMENT   Brittany Mullins  is a 43 y.o. 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  Had 1 min episode of unresponsive with no post ictal period. Eyes closed.  She was evaluated by [place] just 2 weeks ago for similar episodes. She was put on cEEG and we were able to capture 3 PNES. EEG was otherwise normal with no epileptiform discharges.  Low suspicion that this was seizure. Suspect PNES.  RECOMMENDATIONS  - Will follow up on rEEG. - would attempt provocative maneuvers including saline flush in her eyes, or noxious stimuli rather than treating this as epileptic seizure. ______________________________________________________________________  Update 6:57 PM: Routine EEG with no epileptiform discharges, no seizure. We will signoff.   Signed, Erick Blinks, MD [facility]

## 2023-05-22 ENCOUNTER — Other Ambulatory Visit (HOSPITAL_COMMUNITY): Payer: Self-pay

## 2023-05-22 DIAGNOSIS — A419 Sepsis, unspecified organism: Secondary | ICD-10-CM | POA: Diagnosis not present

## 2023-05-22 LAB — COMPREHENSIVE METABOLIC PANEL
ALT: 18 U/L (ref 0–44)
AST: 20 U/L (ref 15–41)
Albumin: 2.5 g/dL — ABNORMAL LOW (ref 3.5–5.0)
Alkaline Phosphatase: 22 U/L — ABNORMAL LOW (ref 38–126)
Anion gap: 11 (ref 5–15)
BUN: 5 mg/dL — ABNORMAL LOW (ref 6–20)
CO2: 22 mmol/L (ref 22–32)
Calcium: 8.4 mg/dL — ABNORMAL LOW (ref 8.9–10.3)
Chloride: 104 mmol/L (ref 98–111)
Creatinine, Ser: 0.61 mg/dL (ref 0.44–1.00)
GFR, Estimated: >60 mL/min (ref >60)
Glucose, Bld: 89 mg/dL (ref 70–99)
Potassium: 3.8 mmol/L (ref 3.5–5.1)
Sodium: 137 mmol/L (ref 135–145)
Total Bilirubin: 0.5 mg/dL (ref 0.0–1.2)
Total Protein: 5.1 g/dL — ABNORMAL LOW (ref 6.5–8.1)

## 2023-05-22 LAB — CBC
HCT: 33.7 % — ABNORMAL LOW (ref 36.0–46.0)
Hemoglobin: 11.3 g/dL — ABNORMAL LOW (ref 12.0–15.0)
MCH: 31 pg (ref 26.0–34.0)
MCHC: 33.5 g/dL (ref 30.0–36.0)
MCV: 92.3 fL (ref 80.0–100.0)
Platelets: 202 10*3/uL (ref 150–400)
RBC: 3.65 MIL/uL — ABNORMAL LOW (ref 3.87–5.11)
RDW: 12.2 % (ref 11.5–15.5)
WBC: 5.1 10*3/uL (ref 4.0–10.5)
nRBC: 0 % (ref 0.0–0.2)

## 2023-05-22 LAB — PHOSPHORUS: Phosphorus: 3.1 mg/dL (ref 2.5–4.6)

## 2023-05-22 LAB — BRAIN NATRIURETIC PEPTIDE: B Natriuretic Peptide: 45.9 pg/mL (ref 0.0–100.0)

## 2023-05-22 LAB — MAGNESIUM: Magnesium: 1.7 mg/dL (ref 1.7–2.4)

## 2023-05-22 MED ORDER — AMOXICILLIN-POT CLAVULANATE 875-125 MG PO TABS
1.0000 | ORAL_TABLET | Freq: Two times a day (BID) | ORAL | 0 refills | Status: DC
  Filled 2023-05-22: qty 10, 5d supply, fill #0

## 2023-05-22 MED ORDER — K PHOS MONO-SOD PHOS DI & MONO 155-852-130 MG PO TABS
500.0000 mg | ORAL_TABLET | Freq: Three times a day (TID) | ORAL | Status: DC
  Administered 2023-05-22: 500 mg via ORAL
  Filled 2023-05-22: qty 2

## 2023-05-22 MED ORDER — HYDROCORTISONE 5 MG PO TABS
ORAL_TABLET | ORAL | 0 refills | Status: AC
  Filled 2023-05-22: qty 60, fill #0

## 2023-05-22 MED ORDER — PANTOPRAZOLE SODIUM 40 MG PO TBEC
40.0000 mg | DELAYED_RELEASE_TABLET | Freq: Every day | ORAL | 0 refills | Status: AC
  Filled 2023-05-22 – 2023-06-13 (×2): qty 30, 30d supply, fill #0

## 2023-05-22 MED ORDER — K PHOS MONO-SOD PHOS DI & MONO 155-852-130 MG PO TABS: 500.0000 mg | ORAL_TABLET | Freq: Three times a day (TID) | ORAL | Status: DC

## 2023-05-22 NOTE — Discharge Summary (Signed)
 Physician Discharge Summary  Brittany Mullins KVQ:[id number] DOB: [birth year] DOA: 05/19/2023  PCP: Patient, No Pcp Per  Admit date: 05/19/2023 Discharge date: 05/22/2023  Admitted From: (Home) Disposition:  (Home)  Recommendations for Outpatient Follow-up:  Follow up with PCP in 1-2 weeks Please obtain BMP/CBC in one week   Home Health: (YES)  Diet recommendation: Heart Healthy  Brief/Interim Summary:   43 y.o. 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  She also had multiple episodes of diarrhea.  Recently hospitalized for acute stroke and spent several days in rehab.  Was discharged on 05/18/2023.    Sepsis/possible acute viral gastroenteritis/Lactic acidosis/multifocal pneumonia  - Vomiting and diarrhea has resolved, CT abdomen pelvis nonacute, appears nontoxic, she was empirically on broad-spectrum antibiotics, her C. difficile has been negative, GI panel has been not sent as she did not have any further diarrhea, empirically on broad-spectrum antibiotics to cover for atypical pneumonia, gastroenteritis, on cefepime, Flagyl and doxycycline, this has narrowed at time of discharge to Augmentin.  W further diarrhea, nausea or vomiting, tolerating her diet.      Intractable nausea and vomiting -Resolved   Recent acute stroke  Stable.  She recently completed dual antiplatelet treatment.  Now supposed to be on just aspirin and statin.   Essential hypertension  -Overall blood pressure soft during hospital stay, so antihypertensive medications has been held hospital stay and on discharge    History of Addison's disease  -Was on stress dose steroids during hospital stay, transitioned to p.o. hydrocortisone and  discharge, started to double her dose for next 3 days  Blurry vision left eye  - No focal neurological deficits noted.  Recent stroke history is noted.  Outpatient ophthalmology evaluation.   Bipolar disorder  Continue Abilify pain   Nonobstructive nephrolithiasis  Incidentally noted on CT scan.   Dysphagia - Patient reports difficulty swallowing.  Seen and cleared by speech.   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  Patient was seen by neurology during hospital stay due to concerns of transient episodes of unresponsiveness, eyes are closed, main concern is for PNES   Low protein S levels  - Apparently has been referred to hematology.      Discharge Diagnoses:  Principal Problem:   Sepsis (HCC) Active Problems:   Hypokalemia/hypomangesia   Intractable nausea and vomiting with acute abdominal pain   Blurry vision, left eye   Chronic pain disorder   History of Addison's disease   HTN (hypertension)   Stroke (cerebrum) (HCC)   low protein S   Bipolar I disorder with mixed features (HCC)   Marijuana use   Nephrolithiasis   Dysphagia   Seizure [facility])    Discharge Instructions  Discharge Instructions     Diet - low sodium heart healthy   Complete by: As directed    Increase activity slowly   Complete by: As directed       Allergies as of 05/22/2023       Reactions   Bee Venom Anaphylaxis   Iodinated Contrast Media Anaphylaxis   Other Anaphylaxis   CT dye   Toradol [ketorolac Tromethamine] Shortness Of Breath   Benadryl [diphenhydramine] Other (See Comments)   States she cannot breathe        Medication List  STOP taking these medications    amLODipine 5 MG tablet Commonly known as: NORVASC   losartan 25 MG tablet Commonly known as: COZAAR       TAKE these medications    acetaminophen 325 MG tablet Commonly known as: TYLENOL Take 2 tablets (650 mg total) by mouth every 6 (six) hours as needed for mild pain (pain score  1-3).   albuterol 108 (90 Base) MCG/ACT inhaler Commonly known as: VENTOLIN HFA Inhale 1-2 puffs into the lungs every 6 (six) hours as needed for wheezing or shortness of breath.   amoxicillin-clavulanate 875-125 MG tablet Commonly known as: AUGMENTIN Take 1 tablet by mouth 2 (two) times daily.   ARIPiprazole 5 MG tablet Commonly known as: ABILIFY Take 1 tablet (5 mg total) by mouth daily.   aspirin EC 81 MG tablet Take 1 tablet (81 mg total) by mouth daily. Swallow whole.   atorvastatin 40 MG tablet Commonly known as: LIPITOR Take 1 tablet (40 mg total) by mouth daily.   capsicum 0.075 % topical cream Commonly known as: ZOSTRIX Apply topically 2 (two) times daily. Apply to shoulders--avoid eyes and broken skin   cyclobenzaprine 5 MG tablet Commonly known as: FLEXERIL Take 1 tablet (5 mg total) by mouth 3 (three) times daily as needed for muscle spasms.   gabapentin 300 MG capsule Commonly known as: NEURONTIN Take 1 capsule (300 mg total) by mouth 2 (two) times daily with breakfast and with lunch   gabapentin 400 MG capsule Commonly known as: NEURONTIN Take 1 capsule (400 mg total) by mouth at bedtime.   hydrocortisone 5 MG tablet Commonly known as: CORTEF Take 4 tablets (20 mg) by mouth daily in the morning and 2 tablets (10 mg) in the evening over the next 3 days then go back to home dose of 2 tablets (10 mg) in the morning, and 1 tablet (5mg ) in the evening. What changed: See the new instructions.   hydrOXYzine 10 MG tablet Commonly known as: ATARAX Take 1 tablet (10 mg total) by mouth 3 (three) times daily as needed for anxiety.   lamoTRIgine 25 MG tablet Commonly known as: LAMICTAL Take 1 tablet (25 mg total) by mouth 2 (two) times daily.   levETIRAcetam 500 MG tablet Commonly known as: KEPPRA Take 1 tablet (500 mg total) by mouth every 8 (eight) hours.   methadone 10 MG/ML solution Commonly known as: DOLOPHINE Take 120 mg by mouth daily.   naloxone 0.4  MG/ML injection Commonly known as: NARCAN Inject 1 mL (0.4 mg total) into the vein as needed.   pantoprazole 40 MG tablet Commonly known as: PROTONIX Take 1 tablet (40 mg total) by mouth daily at 6 (six) AM.   polyethylene glycol 17 g packet Commonly known as: MIRALAX / GLYCOLAX Take 17 g by mouth daily as needed for mild constipation.   polyvinyl alcohol 1.4 % ophthalmic solution Commonly known as: LIQUIFILM TEARS Place 1 drop into both eyes 2 (two) times daily.   Senna-S 8.6-50 MG tablet Generic drug: senna-docusate Take 2 tablets by mouth daily at 6 (six) AM.   traZODone 50 MG tablet Commonly known as: DESYREL Take 1 tablet (50 mg total) by mouth at bedtime as needed for sleep.   Vienva 0.1-20 MG-MCG tablet Generic drug: levonorgestrel-ethinyl estradiol Take 1 tablet by mouth daily.        Follow-up Information     [place], [facility] Follow up.   Why: [facility] will contact you to arrange a home  visit Contact information: [address] [place] [address] [phone]                Allergies  Allergen Reactions   Bee Venom Anaphylaxis   Iodinated Contrast Media Anaphylaxis   Other Anaphylaxis    CT dye   Toradol [Ketorolac Tromethamine] Shortness Of Breath   Benadryl [Diphenhydramine] Other (See Comments)    States she cannot breathe    Consultations: Neurology   Procedures/Studies: EEG adult Result Date: 05/21/2023 Charlsie Quest, MD     05/21/2023  6:19 PM Patient Name: Brittany Mullins MRN: [id number] Epilepsy Attending: Charlsie Quest Referring Physician/Provider: Leroy Sea, MD Date: 05/21/2023 Duration: 24.15 mins Patient history: 43yo f with h/o epilepsy getting eeg to evaluate for seizure Level of alertness: Awake, asleep AEDs during EEG study: LEV, LTG, GBP Technical aspects: This EEG study was done with scalp electrodes positioned according to the 10-20 International system of electrode  placement. Electrical activity was reviewed with band pass filter of 1-70Hz , sensitivity of 7 uV/mm, display speed of 51mm/sec with a 60Hz  notched filter applied as appropriate. EEG data were recorded continuously and digitally stored.  Video monitoring was available and reviewed as appropriate. Description: The posterior dominant rhythm consists of 8-9 Hz activity of moderate voltage (25-35 uV) seen predominantly in posterior head regions, symmetric and reactive to eye opening and eye closing. Sleep was characterized by vertex waves, sleep spindles (12 to 14 Hz), maximal frontocentral region. Hyperventilation and photic stimulation were not performed.   IMPRESSION: This study is within normal limits. No seizures or epileptiform discharges were seen throughout the recording. A normal interictal EEG does not exclude the diagnosis of epilepsy. Charlsie Quest   DG Chest Port 1 View Result Date: 05/21/2023 CLINICAL DATA:  Shortness of breath. EXAM: PORTABLE CHEST 1 VIEW COMPARISON:  05/19/2023 FINDINGS: The lungs are clear without focal pneumonia, edema, pneumothorax or pleural effusion. The cardiopericardial silhouette is within normal limits for size. No acute bony abnormality. Telemetry leads overlie the chest. IMPRESSION: No active disease. The peribronchovascular micro nodularity seen on CT imaging 2 days ago is not evident by x-ray. Electronically Signed   By: Kennith Center M.D.   On: 05/21/2023 06:39   DG Abd Portable 1V Result Date: 05/21/2023 CLINICAL DATA:  Abdominal pain.  Constipation. EXAM: PORTABLE ABDOMEN - 1 VIEW COMPARISON:  08/18/2021 FINDINGS: Gas is seen scattered through nondilated small bowel and colon. No substantial stool volume. Surgical clips in the right upper quadrant suggest prior cholecystectomy. Visualized bony anatomy is without acute findings. IMPRESSION: Nonobstructive bowel gas pattern.  No substantial stool volume. Electronically Signed   By: Kennith Center M.D.   On:  05/21/2023 06:38   CT CHEST ABDOMEN PELVIS WO CONTRAST Result Date: 05/19/2023 CLINICAL DATA:  Sepsis. Recent stroke. Nausea, vomiting and diarrhea. Upper abdominal pain. EXAM: CT CHEST, ABDOMEN AND PELVIS WITHOUT CONTRAST TECHNIQUE: Multidetector CT imaging of the chest, abdomen and pelvis was performed following the standard protocol without IV contrast. RADIATION DOSE REDUCTION: This exam was performed according to the departmental dose-optimization program which includes automated exposure control, adjustment of the mA and/or kV according to patient size and/or use of iterative reconstruction technique. COMPARISON:  CT scan abdomen and pelvis from 08/17/2022 and CT scan chest from 12/22/2017. FINDINGS: CT CHEST FINDINGS Cardiovascular: Normal cardiac size. No pericardial effusion. No aortic aneurysm. Aberrant origin of right subclavian artery noted, which arises distal to the left subclavian artery and courses towards the right side,  posterior to the esophagus. Mediastinum/Nodes: Visualized thyroid gland appears grossly unremarkable. No solid / cystic mediastinal masses. The esophagus is nondistended precluding optimal assessment. There are few mildly prominent mediastinal lymph nodes, which do not meet the size criteria for lymphadenopathy and appear grossly similar to the prior study, favoring benign etiology. No axillary lymphadenopathy by size criteria. Evaluation of bilateral hila is limited due to lack on intravenous contrast: however, no large hilar lymphadenopathy identified. There are few calcified left hilar lymph nodes, similar to the prior study and likely sequela of prior granulomatous infection. Lungs/Pleura: The central tracheo-bronchial tree is patent. There are multiple new small groupings of peribronchovascular solid and ground-glass opacity throughout bilateral lungs with asymmetric more involvement of middle lobe and right lower lobe. Findings are nonspecific and differential diagnosis  includes multilobar pneumonia, aspiration, atypical pneumonia, cryptogenic organizing pneumonia, etc. No pleural effusion or pneumothorax. Musculoskeletal: The visualized soft tissues of the chest wall are grossly unremarkable. No suspicious osseous lesions. CT ABDOMEN PELVIS FINDINGS Hepatobiliary: The liver is normal in size. Non-cirrhotic configuration. No suspicious mass. No intrahepatic or extrahepatic bile duct dilation. Gallbladder is surgically absent. Pancreas: Unremarkable. No pancreatic ductal dilatation or surrounding inflammatory changes. Spleen: Normal in size. There are multiple sub-5 mm calcified granulomas in the spleen. No other focal lesion. Adrenals/Urinary Tract: Adrenal glands are unremarkable. No suspicious renal mass within the limitations of this unenhanced exam. There are at least 3, sub 3 mm nonobstructing calculi each in bilateral kidneys. No ureterolithiasis or obstructive uropathy on either side. Unremarkable urinary bladder. Stomach/Bowel: No disproportionate dilation of the small or large bowel loops. No evidence of abnormal bowel wall thickening or inflammatory changes. The appendix was not visualized; however there is no acute inflammatory process in the right lower quadrant. Vascular/Lymphatic: No ascites or pneumoperitoneum. No abdominal or pelvic lymphadenopathy, by size criteria. No aneurysmal dilation of the major abdominal arteries. Reproductive: The uterus is surgically absent. No large adnexal mass. Other: There is a tiny fat containing umbilical hernia. There are soft tissue density areas in the anterior abdominal wall subcutaneous tissue, most likely due to medication injections. Musculoskeletal: No suspicious osseous lesions. IMPRESSION: 1. There are multiple new small groupings of peribronchovascular solid and ground-glass opacities throughout bilateral lungs, as described above. Findings are nonspecific and differential diagnosis includes multilobar pneumonia,  aspiration, atypical pneumonia, cryptogenic organizing pneumonia, etc. Correlate clinically. 2. No acute inflammatory process identified within the abdomen or pelvis. 3. There are bilateral, sub 3 mm, nonobstructing renal calculi. No ureterolithiasis or obstructive uropathy. 4. Multiple other nonacute observations, as described above. Electronically Signed   By: Jules Schick M.D.   On: 05/19/2023 12:30   DG Chest 2 View Result Date: 05/19/2023 CLINICAL DATA:  43 year old female with possible sepsis. EXAM: CHEST - 2 VIEW COMPARISON:  Chest x-ray 04/22/2023. FINDINGS: Lung volumes are normal. Mild elevation of the right hemidiaphragm. No consolidative airspace disease. No pleural effusions. No pneumothorax. No pulmonary nodule or mass noted. Pulmonary vasculature and the cardiomediastinal silhouette are within normal limits. IMPRESSION: 1. No radiographic evidence of acute cardiopulmonary disease. Electronically Signed   By: Trudie Reed M.D.   On: 05/19/2023 06:11   CT HEAD WO CONTRAST ( ) Result Date: 05/17/2023 CLINICAL DATA:  Stroke, follow up EXAM: CT HEAD WITHOUT CONTRAST TECHNIQUE: Contiguous axial images were obtained from the base of the skull through the vertex without intravenous contrast. RADIATION DOSE REDUCTION: This exam was performed according to the departmental dose-optimization program which includes automated exposure control, adjustment of the mA  and/or kV according to patient size and/or use of iterative reconstruction technique. COMPARISON:  CT head 05/16/2023 FINDINGS: Brain: No evidence of acute large vascular territory infarction, hemorrhage, hydrocephalus, extra-axial collection or mass lesion/mass effect. Partially empty sella. Vascular: No hyperdense vessel. Skull: No acute fracture. Sinuses/Orbits: Clear sinuses.  No acute orbital findings. Other: No mastoid effusions. IMPRESSION: 1. No evidence of acute intracranial abnormality. 2. Partially empty sella, which is often a  normal anatomic variant but can be associated with idiopathic intracranial hypertension. Electronically Signed   By: Feliberto Harts M.D.   On: 05/17/2023 21:44   CT HEAD WO CONTRAST ( ) Result Date: 05/17/2023 CLINICAL DATA:  Stroke, follow up follow up EXAM: CT HEAD WITHOUT CONTRAST TECHNIQUE: Contiguous axial images were obtained from the base of the skull through the vertex without intravenous contrast. RADIATION DOSE REDUCTION: This exam was performed according to the departmental dose-optimization program which includes automated exposure control, adjustment of the mA and/or kV according to patient size and/or use of iterative reconstruction technique. COMPARISON:  CT head May 05, 2023. FINDINGS: Brain: No evidence of new acute infarction, hemorrhage, hydrocephalus, extra-axial collection or mass lesion/mass effect. Known recent left pontine infarct without evidence of progression. Vascular: No hyperdense vessel. Skull: No acute fracture. Sinuses/Orbits: Clear sinuses.  No acute orbital findings. Other: No mastoid effusions. IMPRESSION: 1. No evidence of acute intracranial abnormality. 2. Known recent left pontine infarct without evidence of progression. Electronically Signed   By: Feliberto Harts M.D.   On: 05/17/2023 00:24   DG Swallowing Func-Speech Pathology Result Date: 05/06/2023 Table formatting from the original result was not included. Modified Barium Swallow Study Patient Details Name: Brittany Mullins MRN: [id number] Date of Birth: November 05, 1980 Today's Date: 05/06/2023 HPI/PMH: HPI: 43 year old female with history of gastritis/duodenitis, Addison's disease, pituitary tumor, empty sella syndrome, HTN, opiate dependence on methadone, bipolar disease, THC use, PCOS, seizures presented to the ED on 04/22/23 with right sided weakness and numbness.      MRI showed left pontine infarct. CTA no LVO. No interventions due to outside of window. UDS positive THC. DVT prophylaxis with lovenox.  Placed on ASA and Plavix for 3 weeks  and then ASA alone. Keppra continued as at home. Typically has 1 to 2 seizures per month but can increase to 1-2 seizures a week. Has service Dog for seizure detection. On no home meds for HTN. Placed on Norvasc and Cozaar. LDL 94 and placed on Lipitor. On methadone at home due to pain and nausea from chronic pancreatitis. Estrogen for PCOS.     Developed increased left sided weakness. Stroke team recommended repeat CT and  EEG. No evidence of definite seizures.  Keppra dose adjusted. Noted left upper extremity hypertonia flexion which had also raised concerns for seizure. Clinical Impression: Clinical Impression: Patient presents with oropharyngeal swallowing function that is within functional limits. Oral and pharyngeal clearance was timely and efficient and no significant airway invasion noted across all consistencies. Of note, patient reports globus sensation, intermittent burning/odynophagia when eating, burning sensation in nose and back of throat after waking up in the morning, and intermittent chest pain that is aleviated with eructation. Brief, nondiagnostic esophageal sweep revealed no overt esophageal clearance difficulty but recommend patient seek esophageal evaluation from GI to determine if esopahgeal pathology may underlie her discomfort during meals. Recommend continuation of regular/thin liquid diet with medications taken whole in puree per patient preference. Factors that may increase risk of adverse event in presence of aspiration Rubye Oaks & Clearance Coots 2021):  No data recorded Recommendations/Plan: Swallowing Evaluation Recommendations Swallowing Evaluation Recommendations Recommendations: PO diet PO Diet Recommendation: Regular; Thin liquids (Level 0) Liquid Administration via: Cup; Straw Medication Administration: Whole meds with puree Supervision: Patient able to self-feed Postural changes: Position pt fully upright for meals Oral care recommendations: Oral care  BID (2x/day) Recommended consults: Consider GI consultation; Consider esophageal assessment Treatment Plan Treatment Plan Treatment recommendations: No treatment recommended at this time (no treatment for Swallowing) Recommendations Comment: n/a Functional status assessment: Patient has not had a recent decline in their functional status. Recommendations Recommendations for follow up therapy are one component of a multi-disciplinary discharge planning process, led by the attending physician.  Recommendations may be updated based on patient status, additional functional criteria and insurance authorization. Assessment: Orofacial Exam: Orofacial Exam Oral Cavity: Oral Hygiene: WFL Oral Cavity - Dentition: Dentures, top Orofacial Anatomy: WFL Oral Motor/Sensory Function: Suspected cranial nerve impairment CN V - Trigeminal: Right sensory impairment CN IX - Glossopharyngeal, CN X - Vagus: Right motor impairment Anatomy: Anatomy: WFL Boluses Administered: Boluses Administered Boluses Administered: Mildly thick liquids (Level 2, nectar thick); Thin liquids (Level 0); Moderately thick liquids (Level 3, honey thick); Puree; Solid  Oral Impairment Domain: Oral Impairment Domain Lip Closure: No labial escape Tongue control during bolus hold: Cohesive bolus between tongue to palatal seal Bolus preparation/mastication: Timely and efficient chewing and mashing Bolus transport/lingual motion: Brisk tongue motion Oral residue: Trace residue lining oral structures Location of oral residue : Tongue; Palate Initiation of pharyngeal swallow : Pyriform sinuses  Pharyngeal Impairment Domain: Pharyngeal Impairment Domain Soft palate elevation: No bolus between soft palate (SP)/pharyngeal wall (PW) Laryngeal elevation: Complete superior movement of thyroid cartilage with complete approximation of arytenoids to epiglottic petiole Anterior hyoid excursion: Complete anterior movement Epiglottic movement: Partial inversion (due to abbutting  against posterior pharyngeal wall) Laryngeal vestibule closure: Complete, no air/contrast in laryngeal vestibule Pharyngeal stripping wave : Present - complete Pharyngeal contraction (A/P view only): N/A Pharyngoesophageal segment opening: Complete distension and complete duration, no obstruction of flow Tongue base retraction: No contrast between tongue base and posterior pharyngeal wall (PPW) Pharyngeal residue: Trace residue within or on pharyngeal structures Location of pharyngeal residue: Valleculae  Esophageal Impairment Domain: Esophageal Impairment Domain Esophageal clearance upright position: Complete clearance, esophageal coating Pill: Pill Consistency administered: Puree Puree: WFL Penetration/Aspiration Scale Score: Penetration/Aspiration Scale Score 1.  Material does not enter airway: Thin liquids (Level 0); Mildly thick liquids (Level 2, nectar thick); Moderately thick liquids (Level 3, honey thick); Puree; Solid; Pill Compensatory Strategies: Compensatory Strategies Compensatory strategies: No   General Information: Caregiver present: No  Diet Prior to this Study: Regular; Thin liquids (Level 0)   Temperature : Normal   Respiratory Status: WFL   Supplemental O2: None (Room air)   History of Recent Intubation: No  Behavior/Cognition: Alert; Cooperative; Pleasant mood Self-Feeding Abilities: Able to self-feed Baseline vocal quality/speech: Hypophonia/low volume Volitional Cough: Able to elicit Volitional Swallow: Able to elicit Exam Limitations: Limited visibility (due to patient's "frozen shoulder") Goal Planning: No data recorded No data recorded No data recorded No data recorded Consulted and agree with results and recommendations: Patient Pain: Pain Assessment Pain Assessment: Faces Faces Pain Scale: 4 Pain Location: Shoulders End of Session: Start Time:No data recorded Stop Time: No data recorded Time Calculation:No data recorded Charges: No data recorded SLP visit diagnosis: SLP Visit Diagnosis:  Dysarthria and anarthria (R47.1) Past Medical History: Past Medical History: Diagnosis Date  Addison's disease (HCC)   Brain tumor (benign) (HCC)  Chronic pancreatitis (HCC)   Hearing loss   Hypertension   Marijuana use   Per pt: "medical marijuana patient"  Pancreatitis   PCOS (polycystic ovarian syndrome)   Pituitary tumor   Seizures (HCC)  Past Surgical History: Past Surgical History: Procedure Laterality Date  ABDOMINAL HYSTERECTOMY    APPENDECTOMY    BIOPSY  08/16/2021  Procedure: BIOPSY;  Surgeon: Shellia Cleverly, DO;  Location: [facility];  Service: Gastroenterology;;  CESAREAN SECTION    CHOLECYSTECTOMY    ELBOW SURGERY    ESOPHAGOGASTRODUODENOSCOPY (EGD) WITH PROPOFOL N/A 08/16/2021  Procedure: ESOPHAGOGASTRODUODENOSCOPY (EGD) WITH PROPOFOL;  Surgeon: Shellia Cleverly, DO;  Location: [facility] ENDOSCOPY;  Service: Gastroenterology;  Laterality: N/A;  kidney stent    pancreatic stent    RADIOLOGY WITH ANESTHESIA N/A 04/25/2023  Procedure: MRI of Brain without contrast;  Surgeon: Radiologist, Medication, MD;  Location: [facility] OR;  Service: Radiology;  Laterality: N/A;  RADIOLOGY WITH ANESTHESIA N/A 05/03/2023  Procedure: MRI WITH ANESTHESIA CERVICAL SPINE WITHOUT CONTRAST;  Surgeon: Radiologist, Medication, MD;  Location: [facility] OR;  Service: Radiology;  Laterality: N/A; Yetta Barre 05/06/2023, 12:17 PM  Overnight EEG with video Result Date: 05/06/2023 Charlsie Quest, MD     05/06/2023 10:30 AM Patient Name: Brittany Mullins MRN: [id number] Epilepsy Attending: Charlsie Quest Referring Physician/Provider: Marjorie Smolder, NP Duration: 05/05/2023 1003 to 05/06/2023 1003  Patient history: 43 year old female with a PMHx significant for seizures (on Keppra) and HTN who presents to the emergency department for evaluation of acute onset of right face, arm and leg numbness with weakness. EEG to evaluate for seizure.  Level of alertness: Awake, asleep  AEDs during EEG study: LEV, GBP  Technical aspects: This  EEG study was done with scalp electrodes positioned according to the 10-20 International system of electrode placement. Electrical activity was reviewed with band pass filter of 1-70Hz , sensitivity of 7 uV/mm, display speed of 30mm/sec with a 60Hz  notched filter applied as appropriate. EEG data were recorded continuously and digitally stored.  Video monitoring was available and reviewed as appropriate.  Description: The posterior dominant rhythm consists of 9 Hz activity of moderate voltage (25-35 uV) seen predominantly in posterior head regions, symmetric and reactive to eye opening and eye closing. Sleep was characterized by vertex waves, sleep spindles (12 to 14 Hz), maximal frontocentral region. Hyperventilation and photic stimulation were not performed.   Event button was pressed on 05/05/2023 at 1200.  Patient was sitting in bed, eyes open, not responding.  Concomitant EEG before, during and after the episode showed posterior dominant rhythm.  IMPRESSION: This study is within normal limits. No seizures or epileptiform discharges were seen throughout the recording.  Event button was pressed on 05/05/2023 at 1200 during which patient was laying in bed, not responding without concomitant EEG change.  This was a nonepileptic event.  Priyanka Annabelle Harman   CT HEAD WO CONTRAST ( ) Result Date: 05/05/2023 CLINICAL DATA:  Stroke follow-up. EXAM: CT HEAD WITHOUT CONTRAST TECHNIQUE: Contiguous axial images were obtained from the base of the skull through the vertex without intravenous contrast. RADIATION DOSE REDUCTION: This exam was performed according to the departmental dose-optimization program which includes automated exposure control, adjustment of the mA and/or kV according to patient size and/or use of iterative reconstruction technique. COMPARISON:  04/30/2023 head CT FINDINGS: Brain: No evidence of acute infarction, hemorrhage, hydrocephalus, extra-axial collection or mass lesion/mass effect. Stable low-density in  the left pons. Vascular: No hyperdense vessel or unexpected calcification.  Skull: Normal. Negative for fracture or focal lesion. Sinuses/Orbits: No acute finding. IMPRESSION: No hemorrhage or progression seen at the patient's recent left pontine infarct. No new abnormality. Electronically Signed   By: Tiburcio Pea M.D.   On: 05/05/2023 06:23   EEG adult Result Date: 05/05/2023 Charlsie Quest, MD     05/05/2023 12:06 AM Patient Name: Brittany Mullins MRN: [id number] Epilepsy Attending: Charlsie Quest Referring Physician/Provider: Erick Colace, MD Date: 05/04/2023 Duration: 26.37 mins  Patient history: 43 year old female with a PMHx significant for seizures (on Keppra) and HTN who presents to the emergency department for evaluation of acute onset of right face, arm and leg numbness with weakness. EEG to evaluate for seizure.  Level of alertness: Awake  AEDs during EEG study: LEV, GBP  Technical aspects: This EEG study was done with scalp electrodes positioned according to the 10-20 International system of electrode placement. Electrical activity was reviewed with band pass filter of 1-70Hz , sensitivity of 7 uV/mm, display speed of 35mm/sec with a 60Hz  notched filter applied as appropriate. EEG data were recorded continuously and digitally stored.  Video monitoring was available and reviewed as appropriate.  Description: The posterior dominant rhythm consists of 9 Hz activity of moderate voltage (25-35 uV) seen predominantly in posterior head regions, symmetric and reactive to eye opening and eye closing. Hyperventilation and photic stimulation were not performed.   EEG was technically difficult due to significant myogenic artifact  IMPRESSION: This technically difficult study is within normal limits. No seizures or epileptiform discharges were seen throughout the recording.  A normal interictal EEG does not exclude the diagnosis of epilepsy.  Charlsie Quest   MR CERVICAL SPINE WO  CONTRAST Result Date: 05/03/2023 CLINICAL DATA:  Cervical radiculopathy, no red flags. EXAM: MRI CERVICAL SPINE WITHOUT CONTRAST TECHNIQUE: Multiplanar, multisequence MR imaging of the cervical spine was performed. No intravenous contrast was administered. COMPARISON:  CT cervical spine December 21, 2017. FINDINGS: Alignment: Cervical kyphosis centered at C5, secondary to a 3 mm anterolisthesis of C4 over C5 and trace anterolisthesis of C3 over C4, progressed compared to prior CT. Vertebrae: Diffuse decrease of the T1 signal of the visualized osseous structures. No focal marrow lesion, fracture or evidence of discitis. Cord: Normal signal and morphology. Posterior Fossa, vertebral arteries, paraspinal tissues: T2 hyperintense signal on the left side of the pons corresponding to pontine stroke seen on prior MRI of the brain. Enlarged, partial empty sella. Two mildly prominent left medial supraclavicular lymph nodes appear similar two CT performed in 2019. Disc levels: Perineural cysts are seen bilaterally from C4-5 through C7-T1, more prominent on the right side. C2-3: No spinal canal or neural foraminal stenosis. C3-4: Minimal disc bulge, mild to moderate facet degenerative changes and ligamentum flavum redundancy resulting in minimal bilateral neural foraminal narrowing. No spinal canal stenosis. C4-5: Anterolisthesis and minimal disc bulge resulting in mild spinal canal stenosis. Mild to moderate right and advanced left facet degenerative changes resulting in mild left neural foraminal narrowing. C5-6: Posterior disc osteophyte complex asymmetric to the right causes mild mass effect on the right anterior cord surface without cord signal abnormality. Uncovertebral and facet degenerative changes contribute for mild right neural foraminal narrowing. C6-7: Small posterior disc osteophyte complex, uncovertebral and facet degenerative changes without significant spinal canal or neural foraminal stenosis. C7-T1: No  spinal canal neural foraminal stenosis. IMPRESSION: 1. Anterolisthesis of C4 over C5, progressed since prior CT, facet mediated. 2. Degenerative changes of the cervical spine with mild spinal  canal stenosis at C4-5 and C5-6. 3. Mild neural foraminal narrowing on the left at C4-5 and on the right at C5-6. 4. Diffuse decrease of the T1 signal of the visualized osseous structures, which may be seen in the setting of anemia or other marrow replacing processes. Electronically Signed   By: Baldemar Lenis M.D.   On: 05/03/2023 10:13   VAS [place] LOWER EXTREMITY VENOUS (DVT) Result Date: 05/01/2023  Lower Venous DVT Study Patient Name:  Brittany Mullins [facility]  Date of Exam:   04/30/2023 Medical Rec #: [id number]                   Accession #:    [id number] Date of Birth: Jun 17, 1980                   Patient Gender: F Patient Age:   77 years Exam Location:  [facility] Procedure:      VAS [place] LOWER EXTREMITY VENOUS (DVT) Referring Phys: STEPHEN CHIU --------------------------------------------------------------------------------  Indications: Pain.  Risk Factors: None identified. Comparison Study: No significant changes seen since previous exam 04/23/23. Performing Technologist: Shona Simpson  Examination Guidelines: A complete evaluation includes B-mode imaging, spectral Doppler, color Doppler, and power Doppler as needed of all accessible portions of each vessel. Bilateral testing is considered an integral part of a complete examination. Limited examinations for reoccurring indications may be performed as noted. The reflux portion of the exam is performed with the patient in reverse Trendelenburg.  +---------+---------------+---------+-----------+----------+--------------+ RIGHT    CompressibilityPhasicitySpontaneityPropertiesThrombus Aging +---------+---------------+---------+-----------+----------+--------------+ CFV      Full           Yes      Yes                                  +---------+---------------+---------+-----------+----------+--------------+ SFJ      Full                                                        +---------+---------------+---------+-----------+----------+--------------+ FV Prox  Full                                                        +---------+---------------+---------+-----------+----------+--------------+ FV Mid   Full                                                        +---------+---------------+---------+-----------+----------+--------------+ FV DistalFull                                                        +---------+---------------+---------+-----------+----------+--------------+ PFV      Full                                                        +---------+---------------+---------+-----------+----------+--------------+  POP      Full           Yes      Yes                                 +---------+---------------+---------+-----------+----------+--------------+ PTV      Full                                                        +---------+---------------+---------+-----------+----------+--------------+ PERO     Full                                                        +---------+---------------+---------+-----------+----------+--------------+   +----+---------------+---------+-----------+----------+--------------+ LEFTCompressibilityPhasicitySpontaneityPropertiesThrombus Aging +----+---------------+---------+-----------+----------+--------------+ CFV Full           Yes      Yes                                 +----+---------------+---------+-----------+----------+--------------+    Summary: RIGHT: - There is no evidence of deep vein thrombosis in the lower extremity.  - No cystic structure found in the popliteal fossa.  LEFT: - No evidence of common femoral vein obstruction.   *See table(s) above for measurements and observations. Electronically signed by Lemar Livings MD on  05/01/2023 at 8:13:12 AM.    Final    DG Shoulder Left Result Date: 04/30/2023 CLINICAL DATA:  Left shoulder pain. EXAM: LEFT SHOULDER - 2+ VIEW COMPARISON:  12/21/2017 FINDINGS: There is no evidence of fracture or dislocation. The alignment and joint spaces are preserved. No erosions. There is no evidence of arthropathy or other focal bone abnormality. Soft tissues are unremarkable. IMPRESSION: Unremarkable radiographs of the left shoulder. Electronically Signed   By: Narda Rutherford M.D.   On: 04/30/2023 16:18   CT HEAD WO CONTRAST ( ) Result Date: 04/30/2023 CLINICAL DATA:  Neuro deficit, acute, stroke suspected. EXAM: CT HEAD WITHOUT CONTRAST TECHNIQUE: Contiguous axial images were obtained from the base of the skull through the vertex without intravenous contrast. RADIATION DOSE REDUCTION: This exam was performed according to the departmental dose-optimization program which includes automated exposure control, adjustment of the mA and/or kV according to patient size and/or use of iterative reconstruction technique. COMPARISON:  CT 04/28/2023.  Brain MRI 04/25/2023. FINDINGS: Brain: Similar focal low-density in the left para median pons without evidence of extension or hemorrhage, consistent with the acute infarction in that location. Otherwise, the brain has normal appearance without evidence of other focal stroke, mass, hemorrhage, hydrocephalus or extra-axial collection. 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 Vascular: No abnormal vascular finding. Skull: Negative Sinuses/Orbits: Clear/normal Other: None IMPRESSION: 1. Similar focal low-density in the left para median pons without evidence of extension or hemorrhage, consistent with the acute infarction in that location. No other focal brain finding. 2. 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 Electronically Signed   By: Paulina Fusi M.D.   On: 04/30/2023 15:32   DG Elbow 2 Views  Left Result Date: 04/30/2023 CLINICAL DATA:  Left elbow pain. EXAM: LEFT  ELBOW - 2 VIEW COMPARISON:  None Available. FINDINGS: Frontal and lateral views of the left elbow. Normal position of the distal anterior humeral fat pad without evidence of a joint effusion. Joint spaces are maintained. No acute fracture or dislocation. IMPRESSION: Normal left elbow radiographs. Electronically Signed   By: Neita Garnet M.D.   On: 04/30/2023 15:23   Overnight EEG with video Result Date: 04/30/2023 Charlsie Quest, MD     05/01/2023  9:59 AM Patient Name: Brittany Mullins MRN: [id number] Epilepsy Attending: Charlsie Quest Referring Physician/Provider: Mathews Argyle, NP Duration: 1/27/205 1523 to 04/30/2023 1427 Patient history: 43 year old female with a PMHx significant for seizures (on Keppra) and HTN who presents to the emergency department for evaluation of acute onset of right face, arm and leg numbness with weakness. EEG to evaluate for seizure. Level of alertness: Awake, asleep AEDs during EEG study: LEV, GBP Technical aspects: This EEG study was done with scalp electrodes positioned according to the 10-20 International system of electrode placement. Electrical activity was reviewed with band pass filter of 1-70Hz , sensitivity of 7 uV/mm, display speed of 39mm/sec with a 60Hz  notched filter applied as appropriate. EEG data were recorded continuously and digitally stored.  Video monitoring was available and reviewed as appropriate. Description: The posterior dominant rhythm consists of 9 Hz activity of moderate voltage (25-35 uV) seen predominantly in posterior head regions, symmetric and reactive to eye opening and eye closing. Sleep was characterized by vertex waves, sleep spindles (12 to 14 Hz), maximal frontocentral region.  Hyperventilation and photic stimulation were not performed.   IMPRESSION: This study is within normal limits. No seizures or epileptiform discharges were seen throughout the  recording. A normal interictal EEG does not exclude the diagnosis of epilepsy. Priyanka Annabelle Harman   CT HEAD WO CONTRAST ( ) Result Date: 04/29/2023 CLINICAL DATA:  Neuro deficit, acute, stroke suspected EXAM: CT HEAD WITHOUT CONTRAST TECHNIQUE: Contiguous axial images were obtained from the base of the skull through the vertex without intravenous contrast. RADIATION DOSE REDUCTION: This exam was performed according to the departmental dose-optimization program which includes automated exposure control, adjustment of the mA and/or kV according to patient size and/or use of iterative reconstruction technique. COMPARISON:  MRI head 04/25/2023. FINDINGS: Brain: Question loss of gray-white matter differentiation along the right frontal lobe (3:22, 6:29). No evidence of large-territorial acute infarction. No parenchymal hemorrhage. No mass lesion. No extra-axial collection. No mass effect or midline shift. No hydrocephalus. Basilar cisterns are patent. Partial empty sella. Vascular: No hyperdense vessel. Skull: No acute fracture or focal lesion. Sinuses/Orbits: Paranasal sinuses and mastoid air cells are clear. The orbits are unremarkable. Other: None. IMPRESSION: 1. Question loss of gray-white matter differentiation along the right frontal lobe-concern for acute infarction. Consider MRI head without contrast for further evaluation. 2. No acute intracranial hemorrhage. These results will be called to the ordering clinician or representative by the Radiologist Assistant, and communication documented in the PACS or [employer]. Electronically Signed   By: Tish Frederickson M.D.   On: 04/29/2023 00:04   DG Neck Soft Tissue Result Date: 04/27/2023 CLINICAL DATA:  Palpable left neck mass at the base of the skull. EXAM: NECK SOFT TISSUES - 1+ VIEW COMPARISON:  None Available. FINDINGS: No radiographically visible mass. Multilevel cervical spine degenerative changes with reversal of the normal lordosis in the lower  cervical spine. IMPRESSION: No radiographically visible mass. If there is a persistent palpable mass, recommend a CT of the neck with contrast.  Electronically Signed   By: Beckie Salts M.D.   On: 04/27/2023 20:00   MR BRAIN W WO CONTRAST Result Date: 04/25/2023 CLINICAL DATA:  Provided history: Pituitary tumor.  Seizures. EXAM: MRI HEAD WITHOUT AND WITH CONTRAST TECHNIQUE: Multiplanar, multiecho pulse sequences of the brain and surrounding structures were obtained without and with intravenous contrast. CONTRAST:  6mL GADAVIST GADOBUTROL 1 MMOL/ML IV SOLN COMPARISON:  Brain MRI 04/22/2023. Non-contrast head CT and CT angiogram head/neck 04/22/2023. FINDINGS: Brain: No age-advanced or lobar predominant parenchymal atrophy. Pituitary protocol sequences were acquired, including dynamic post-contrast imaging. Partially empty sella turcica. No evidence of a pituitary mass. The pituitary stalk is midline and is not abnormally thickened. Known 14 mm acute infarct within the left aspect of the pons, unchanged from the prior MRI of 04/22/2023 (remeasured on prior). There are a few small nonspecific foci of T2 FLAIR hyperintense signal abnormality scattered within the bilateral cerebral white matter. No cortical encephalomalacia is identified. No appreciable hippocampal size or signal asymmetry. No chronic intracranial blood products. No extra-axial fluid collection. No midline shift. No pathologic intracranial enhancement identified. Vascular: Maintained flow voids within the proximal large arterial vessels. Skull and upper cervical spine: Abnormal T1 hypointense marrow signal within the calvarium and within visualized portions of the cervical spine. Sinuses/Orbits: No mass or acute finding within the imaged orbits. Mild-to-moderate mucosal thickening within the right frontal, bilateral ethmoid and left sphenoid sinuses. Mild mucosal thickening within the right sphenoid sinus. IMPRESSION: 1. Partially empty sella turcica.  No evidence of a pituitary mass. 2. Known 14 mm acute infarct within the left aspect of the pons. 3. There are a few small nonspecific chronic insults within the cerebral white matter. 4. Paranasal sinus disease at the imaged levels as described. 5. Abnormal T1 hypointense marrow signal within the calvarium and within visualized portions of the cervical spine. While these findings can reflect a marrow infiltrative process, the most common causes include chronic anemia, smoking and obesity. Electronically Signed   By: Jackey Loge D.O.   On: 04/25/2023 09:55   ECHOCARDIOGRAM COMPLETE Result Date: 04/23/2023    ECHOCARDIOGRAM REPORT   Patient Name:   Brittany Mullins [facility] Date of Exam: 04/23/2023 Medical Rec #:  [id number]                  Height:       62.0 in Accession #:    [id number]                 Weight:       141.1 lb Date of Birth:  [birth year]                  BSA:          1.648 m Patient Age:    42 years                   BP:           159/104 mmHg Patient Gender: F                          HR:           76 bpm. Exam Location:  Inpatient Procedure: 2D Echo, Cardiac Doppler and Color Doppler Indications:    Stroke  History:        Patient has prior history of Echocardiogram examinations, most  recent 06/04/2017.  Sonographer:    Karma Ganja Referring Phys: [id number] DANIEL V THOMPSON  Sonographer Comments: Image acquisition challenging due to patient body habitus and Image acquisition challenging due to respiratory motion. IMPRESSIONS  1. Left ventricular ejection fraction, by estimation, is 60 to 65%. The left ventricle has normal function. The left ventricle has no regional wall motion abnormalities. Left ventricular diastolic parameters were normal.  2. Right ventricular systolic function is normal. The right ventricular size is normal.  3. The mitral valve is normal in structure. No evidence of mitral valve regurgitation. No evidence of mitral stenosis.  4. The aortic valve is normal in  structure. Aortic valve regurgitation is not visualized. No aortic stenosis is present.  5. The inferior vena cava is normal in size with greater than 50% respiratory variability, suggesting right atrial pressure of 3 mmHg. FINDINGS  Left Ventricle: Left ventricular ejection fraction, by estimation, is 60 to 65%. The left ventricle has normal function. The left ventricle has no regional wall motion abnormalities. The left ventricular internal cavity size was normal in size. There is  no left ventricular hypertrophy. Left ventricular diastolic parameters were normal. Right Ventricle: The right ventricular size is normal. No increase in right ventricular wall thickness. Right ventricular systolic function is normal. Left Atrium: Left atrial size was normal in size. Right Atrium: Right atrial size was normal in size. Pericardium: There is no evidence of pericardial effusion. Mitral Valve: The mitral valve is normal in structure. No evidence of mitral valve regurgitation. No evidence of mitral valve stenosis. Tricuspid Valve: The tricuspid valve is normal in structure. Tricuspid valve regurgitation is not demonstrated. No evidence of tricuspid stenosis. Aortic Valve: The aortic valve is normal in structure. Aortic valve regurgitation is not visualized. No aortic stenosis is present. Aortic valve mean gradient measures 4.0 mmHg. Aortic valve peak gradient measures 8.4 mmHg. Aortic valve area, by VTI measures 2.16 cm. Pulmonic Valve: The pulmonic valve was normal in structure. Pulmonic valve regurgitation is not visualized. No evidence of pulmonic stenosis. Aorta: The aortic root is normal in size and structure. Venous: The inferior vena cava is normal in size with greater than 50% respiratory variability, suggesting right atrial pressure of 3 mmHg. IAS/Shunts: No atrial level shunt detected by color flow Doppler.  LEFT VENTRICLE PLAX 2D LVIDd:         4.60 cm     Diastology LVIDs:         2.80 cm     LV e' medial:     8.81 cm/s LV PW:         1.20 cm     LV E/e' medial:  10.1 LV IVS:        1.10 cm     LV e' lateral:   12.10 cm/s LVOT diam:     2.10 cm     LV E/e' lateral: 7.3 LV SV:         63 LV SV Index:   38 LVOT Area:     3.46 cm  LV Volumes (MOD) LV vol d, MOD A2C: 96.3 ml LV vol d, MOD A4C: 81.4 ml LV vol s, MOD A2C: 41.0 ml LV vol s, MOD A4C: 39.3 ml LV SV MOD A2C:     55.3 ml LV SV MOD A4C:     81.4 ml LV SV MOD BP:      50.7 ml RIGHT VENTRICLE RV Basal diam:  2.90 cm RV S prime:     12.40 cm/s TAPSE (  M-mode): 2.7 cm LEFT ATRIUM             Index        RIGHT ATRIUM           Index LA diam:        3.30 cm 2.00 cm/m   RA Area:     11.40 cm LA Vol (A2C):   44.5 ml 27.00 ml/m  RA Volume:   26.40 ml  16.02 ml/m LA Vol (A4C):   37.1 ml 22.51 ml/m LA Biplane Vol: 41.9 ml 25.42 ml/m  AORTIC VALVE AV Area (Vmax):    2.20 cm AV Area (Vmean):   2.22 cm AV Area (VTI):     2.16 cm AV Vmax:           145.00 cm/s AV Vmean:          98.100 cm/s AV VTI:            0.292 m AV Peak Grad:      8.4 mmHg AV Mean Grad:      4.0 mmHg LVOT Vmax:         92.00 cm/s LVOT Vmean:        62.900 cm/s LVOT VTI:          0.182 m LVOT/AV VTI ratio: 0.62  AORTA Ao Root diam: 3.40 cm Ao Asc diam:  2.80 cm MITRAL VALVE MV Area (PHT): 3.86 cm    SHUNTS MV E velocity: 88.70 cm/s  Systemic VTI:  0.18 m MV A velocity: 80.10 cm/s  Systemic Diam: 2.10 cm MV E/A ratio:  1.11 Mihai Croitoru MD Electronically signed by Thurmon Fair MD Signature Date/Time: 04/23/2023/3:22:37 PM    Final    VAS [place] LOWER EXTREMITY VENOUS (DVT) Result Date: 04/23/2023  Lower Venous DVT Study Patient Name:  AVILENE MARRIN [facility]  Date of Exam:   04/23/2023 Medical Rec #: [id number]                   Accession #:    [id number] Date of Birth: Sep 11, 1980                   Patient Gender: F Patient Age:   89 years Exam Location:  [facility] Procedure:      VAS [place] LOWER EXTREMITY VENOUS (DVT) Referring Phys: Ramiro Harvest  --------------------------------------------------------------------------------  Indications: Stroke.  Risk Factors: None identified. Comparison Study: No prior studies. Performing Technologist: Chanda Busing RVT  Examination Guidelines: A complete evaluation includes B-mode imaging, spectral Doppler, color Doppler, and power Doppler as needed of all accessible portions of each vessel. Bilateral testing is considered an integral part of a complete examination. Limited examinations for reoccurring indications may be performed as noted. The reflux portion of the exam is performed with the patient in reverse Trendelenburg.  +---------+---------------+---------+-----------+----------+--------------+ RIGHT    CompressibilityPhasicitySpontaneityPropertiesThrombus Aging +---------+---------------+---------+-----------+----------+--------------+ CFV      Full           Yes      Yes                                 +---------+---------------+---------+-----------+----------+--------------+ SFJ      Full                                                        +---------+---------------+---------+-----------+----------+--------------+  FV Prox  Full                                                        +---------+---------------+---------+-----------+----------+--------------+ FV Mid   Full                                                        +---------+---------------+---------+-----------+----------+--------------+ FV DistalFull                                                        +---------+---------------+---------+-----------+----------+--------------+ PFV      Full                                                        +---------+---------------+---------+-----------+----------+--------------+ POP      Full           Yes      Yes                                 +---------+---------------+---------+-----------+----------+--------------+ PTV      Full                                                         +---------+---------------+---------+-----------+----------+--------------+ PERO     Full                                                        +---------+---------------+---------+-----------+----------+--------------+   +---------+---------------+---------+-----------+----------+--------------+ LEFT     CompressibilityPhasicitySpontaneityPropertiesThrombus Aging +---------+---------------+---------+-----------+----------+--------------+ CFV      Full           Yes      Yes                                 +---------+---------------+---------+-----------+----------+--------------+ SFJ      Full                                                        +---------+---------------+---------+-----------+----------+--------------+ FV Prox  Full                                                        +---------+---------------+---------+-----------+----------+--------------+  FV Mid   Full                                                        +---------+---------------+---------+-----------+----------+--------------+ FV DistalFull                                                        +---------+---------------+---------+-----------+----------+--------------+ PFV      Full                                                        +---------+---------------+---------+-----------+----------+--------------+ POP      Full           Yes      Yes                                 +---------+---------------+---------+-----------+----------+--------------+ PTV      Full                                                        +---------+---------------+---------+-----------+----------+--------------+ PERO     Full                                                        +---------+---------------+---------+-----------+----------+--------------+     Summary: RIGHT: - There is no evidence of deep vein thrombosis in the  lower extremity.  - No cystic structure found in the popliteal fossa.  LEFT: - There is no evidence of deep vein thrombosis in the lower extremity.  - No cystic structure found in the popliteal fossa.  *See table(s) above for measurements and observations. Electronically signed by Lemar Livings MD on 04/23/2023 at 1:38:29 PM.    Final    Portable chest 1 View Result Date: 04/22/2023 CLINICAL DATA:  CVA EXAM: PORTABLE CHEST 1 VIEW COMPARISON:  03/16/2023 FINDINGS: No consolidation or effusion. Small nodular focus of opacity at the right base suspect summation shadow or inflammatory given rapid appearance. Normal cardiomediastinal silhouette. No pneumothorax IMPRESSION: No active disease. Small nodular focus of opacity at the right base suspect summation shadow or inflammatory given rapid appearance, attention on two-view chest radiograph follow-up. Electronically Signed   By: Jasmine Pang M.D.   On: 04/22/2023 18:57   MR Brain W and Wo Contrast Result Date: 04/22/2023 CLINICAL DATA:  Headache, neuro deficit. EXAM: MRI HEAD WITHOUT AND WITH CONTRAST TECHNIQUE: Multiplanar, multiecho pulse sequences of the brain and surrounding structures were obtained without and with intravenous contrast. CONTRAST:  6mL GADAVIST GADOBUTROL 1 MMOL/ML IV SOLN COMPARISON:  Head and neck CTA 04/22/2023 FINDINGS: The study is moderately motion degraded. Brain: There is  a 10 x 6 mm acute left pontine perforator infarct. No brain parenchymal signal abnormality is identified elsewhere within limitations of motion artifact. No intracranial hemorrhage, mass, midline shift, extra-axial fluid collection, or abnormal enhancement is identified. Cerebral volume is normal. The ventricles are normal in size. 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 Vascular: Major intracranial vascular flow voids are preserved. Skull and upper cervical spine: Diffusely diminished bone marrow T1 signal intensity, nonspecific though can be seen with anemia,  smoking, and obesity. Sinuses/Orbits: Unremarkable orbits. Mild mucosal thickening in the paranasal sinuses. Clear mastoid air cells. Other: None. IMPRESSION: Acute left pontine infarct. Electronically Signed   By: Sebastian Ache M.D.   On: 04/22/2023 17:05      Subjective: No significant events overnight, she denies any complaints, no nausea, no vomiting, no diarrhea, tolerating her diet, asking to go home.  Discharge Exam: Vitals:   05/22/23 0435 05/22/23 0811  BP:  121/68  Pulse:  75  Resp:  16  Temp: (!) 97.4 F (36.3 C) (!) 97.5 F (36.4 C)  SpO2:  100%   Vitals:   05/21/23 2200 05/22/23 0000 05/22/23 0435 05/22/23 0811  BP:    121/68  Pulse: 74 68  75  Resp: 13 14  16   Temp:   (!) 97.4 F (36.3 C) (!) 97.5 F (36.4 C)  TempSrc:   Oral Oral  SpO2: 98% 97%  100%  Weight:      Height:        General: Pt is alert, awake, not in acute distress Cardiovascular: RRR, S1/S2 +, no rubs, no gallops Respiratory: CTA bilaterally, no wheezing, no rhonchi Abdominal: Soft, NT, ND, bowel sounds + Extremities: no edema, no cyanosis    The results of significant diagnostics from this hospitalization (including imaging, microbiology, ancillary and laboratory) are listed below for reference.     Microbiology: Recent Results (from the past 240 hours)  Blood Culture (routine x 2)     Status: None (Preliminary result)   Collection Time: 05/19/23  5:30 AM   Specimen: BLOOD RIGHT HAND  Result Value Ref Range Status   Specimen Description BLOOD RIGHT HAND  Final   Special Requests   Final    BOTTLES DRAWN AEROBIC ONLY Blood Culture adequate volume   Culture   Final    NO GROWTH 3 DAYS Performed at [facility], [address] [place], [place] [address]    Report Status PENDING  Incomplete  Blood Culture (routine x 2)     Status: None (Preliminary result)   Collection Time: 05/19/23  5:35 AM   Specimen: BLOOD  Result Value Ref Range Status   Specimen Description BLOOD  LEFT ANTECUBITAL  Final   Special Requests   Final    BOTTLES DRAWN AEROBIC AND ANAEROBIC Blood Culture adequate volume   Culture   Final    NO GROWTH 3 DAYS Performed at [facility], [address] [place], [place] [address]    Report Status PENDING  Incomplete  Resp panel by RT-PCR (RSV, Flu A&B, Covid)     Status: None   Collection Time: 05/19/23  5:41 AM   Specimen: Nasal Swab  Result Value Ref Range Status   SARS Coronavirus 2 by RT PCR NEGATIVE NEGATIVE Final   Influenza A by PCR NEGATIVE NEGATIVE Final   Influenza B by PCR NEGATIVE NEGATIVE Final    Comment: (NOTE) The Xpert Xpress SARS-CoV-2/FLU/RSV plus assay is intended as an aid in the diagnosis of  influenza from Nasopharyngeal swab specimens and should not be used as a sole basis for treatment. Nasal washings and aspirates are unacceptable for Xpert Xpress SARS-CoV-2/FLU/RSV testing.  Fact Sheet for Patients: BloggerCourse.com  Fact Sheet for Healthcare Providers: SeriousBroker.it  This test is not yet approved or cleared by the [place] FDA and has been authorized for detection and/or diagnosis of SARS-CoV-2 by FDA under an Emergency Use Authorization (EUA). This EUA will remain in effect (meaning this test can be used) for the duration of the COVID-19 declaration under Section 564(b)(1) of the Act, 21 U.S.C. section 360bbb-3(b)(1), unless the authorization is terminated or revoked.     Resp Syncytial Virus by PCR NEGATIVE NEGATIVE Final    Comment: (NOTE) Fact Sheet for Patients: BloggerCourse.com  Fact Sheet for Healthcare Providers: SeriousBroker.it  This test is not yet approved or cleared by the [place] FDA and has been authorized for detection and/or diagnosis of SARS-CoV-2 by FDA under an Emergency Use Authorization (EUA). This EUA will remain in effect (meaning this test can be used)  for the duration of the COVID-19 declaration under Section 564(b)(1) of the Act, 21 U.S.C. section 360bbb-3(b)(1), unless the authorization is terminated or revoked.  Performed at [facility], [address] [place], [place] [address]   Urine Culture     Status: Abnormal   Collection Time: 05/19/23  8:59 AM   Specimen: Urine, Clean Catch  Result Value Ref Range Status   Specimen Description URINE, CLEAN CATCH  Final   Special Requests   Final    NONE Performed at [facility], [address] [place], [place] [address]    Culture 30,000 COLONIES/mL Kathryne Gin (A)  Final   Report Status 05/21/2023 FINAL  Final   Organism ID, Bacteria KLUYVERA ASCORBATA (A)  Final      Susceptibility   Kluyvera ascorbata - MIC*    AMPICILLIN 16 INTERMEDIATE Intermediate     CEFEPIME <=0.12 SENSITIVE Sensitive     CEFTRIAXONE 8 RESISTANT Resistant     CIPROFLOXACIN <=0.25 SENSITIVE Sensitive     GENTAMICIN <=1 SENSITIVE Sensitive     IMIPENEM <=0.25 SENSITIVE Sensitive     NITROFURANTOIN 64 INTERMEDIATE Intermediate     TRIMETH/SULFA <=20 SENSITIVE Sensitive     AMPICILLIN/SULBACTAM <=2 SENSITIVE Sensitive     PIP/TAZO <=4 SENSITIVE Sensitive ug/mL    * 30,000 COLONIES/mL KLUYVERA ASCORBATA  Respiratory (~20 pathogens) panel by PCR     Status: None   Collection Time: 05/19/23 11:53 AM   Specimen: Nasopharyngeal Swab; Respiratory  Result Value Ref Range Status   Adenovirus NOT DETECTED NOT DETECTED Final   Coronavirus 229E NOT DETECTED NOT DETECTED Final    Comment: (NOTE) The Coronavirus on the Respiratory Panel, DOES NOT test for the novel  Coronavirus (2019 nCoV)    Coronavirus HKU1 NOT DETECTED NOT DETECTED Final   Coronavirus NL63 NOT DETECTED NOT DETECTED Final   Coronavirus OC43 NOT DETECTED NOT DETECTED Final   Metapneumovirus NOT DETECTED NOT DETECTED Final   Rhinovirus / Enterovirus NOT DETECTED NOT DETECTED Final   Influenza A NOT DETECTED NOT DETECTED  Final   Influenza B NOT DETECTED NOT DETECTED Final   Parainfluenza Virus 1 NOT DETECTED NOT DETECTED Final   Parainfluenza Virus 2 NOT DETECTED NOT DETECTED Final   Parainfluenza Virus 3 NOT DETECTED NOT DETECTED Final   Parainfluenza Virus 4 NOT DETECTED NOT DETECTED Final   Respiratory Syncytial Virus NOT DETECTED NOT DETECTED Final   Bordetella  pertussis NOT DETECTED NOT DETECTED Final   Bordetella Parapertussis NOT DETECTED NOT DETECTED Final   Chlamydophila pneumoniae NOT DETECTED NOT DETECTED Final   Mycoplasma pneumoniae NOT DETECTED NOT DETECTED Final    Comment: Performed at [facility], [address], [place], [place] [address]  C Difficile Quick Screen w PCR reflex     Status: None   Collection Time: 05/20/23  4:04 AM   Specimen: Stool  Result Value Ref Range Status   C Diff antigen NEGATIVE NEGATIVE Final   C Diff toxin NEGATIVE NEGATIVE Final   C Diff interpretation No C. difficile detected.  Final    Comment: Performed at [facility], [address] [place], [place] [address]     Labs: BNP (last 3 results) Recent Labs    03/16/23 2150 05/22/23 0456  BNP 35.5 45.9   Basic Metabolic Panel: Recent Labs  Lab 05/16/23 0751 05/19/23 0535 05/19/23 1315 05/20/23 1220 05/21/23 0452 05/21/23 1042 05/22/23 0456  NA 136 136  --  134* 138  --  137  K 3.8 3.1*  --  3.5 4.1  --  3.8  CL 99 99  --  100 107  --  104  CO2 26 21*  --  24 19*  --  22  GLUCOSE 60* 109*  --  93 70  --  89  BUN 18 15  --  12 12  --  5*  CREATININE 0.77 0.77  --  0.74 0.72  --  0.61  CALCIUM 8.8* 8.7*  --  8.3* 8.3*  --  8.4*  MG  --   --  1.3* 2.2 2.2  --  1.7  PHOS  --   --   --   --   --  2.4* 3.1   Liver Function Tests: Recent Labs  Lab 05/19/23 0535 05/20/23 1220 05/21/23 0452 05/22/23 0456  AST 31 28 21 20   ALT 23 23 20 18   ALKPHOS 37* 29* 24* 22*  BILITOT 1.0 0.5 0.4 0.5  PROT 6.6 6.0* 5.3* 5.1*  ALBUMIN 3.6 2.9* 2.6* 2.5*   Recent Labs  Lab  05/19/23 1315  LIPASE 23   No results for input(s): "AMMONIA" in the last 168 hours. CBC: Recent Labs  Lab 05/19/23 0535 05/20/23 1220 05/21/23 0452 05/22/23 0456  WBC 6.0 6.7 4.7 5.1  NEUTROABS 4.9  --   --   --   HGB 14.5 13.6 12.2 11.3*  HCT 42.7 40.8 37.3 33.7*  MCV 91.0 92.3 94.9 92.3  PLT 228 221 186 202   Cardiac Enzymes: No results for input(s): "CKTOTAL", "CKMB", "CKMBINDEX", "TROPONINI" in the last 168 hours. BNP: Invalid input(s): "POCBNP" CBG: Recent Labs  Lab 05/15/23 2228 05/15/23 2251  GLUCAP 122* 102*   D-Dimer No results for input(s): "DDIMER" in the last 72 hours. Hgb A1c No results for input(s): "HGBA1C" in the last 72 hours. Lipid Profile No results for input(s): "CHOL", "HDL", "LDLCALC", "TRIG", "CHOLHDL", "LDLDIRECT" in the last 72 hours. Thyroid function studies Recent Labs    05/19/23 1745  TSH 0.982   Anemia work up No results for input(s): "VITAMINB12", "FOLATE", "FERRITIN", "TIBC", "IRON", "RETICCTPCT" in the last 72 hours. Urinalysis    Component Value Date/Time   COLORURINE YELLOW 05/19/2023 0825   APPEARANCEUR CLEAR 05/19/2023 0825   LABSPEC 1.012 05/19/2023 0825   PHURINE 7.0 05/19/2023 0825   GLUCOSEU NEGATIVE 05/19/2023 0825   HGBUR NEGATIVE 05/19/2023 0825   BILIRUBINUR NEGATIVE 05/19/2023 0825  BILIRUBINUR negative 06/12/2017 1053   KETONESUR NEGATIVE 05/19/2023 0825   PROTEINUR NEGATIVE 05/19/2023 0825   UROBILINOGEN 0.2 05/18/2020 1104   NITRITE NEGATIVE 05/19/2023 0825   LEUKOCYTESUR NEGATIVE 05/19/2023 0825   Sepsis Labs Recent Labs  Lab 05/19/23 0535 05/20/23 1220 05/21/23 0452 05/22/23 0456  WBC 6.0 6.7 4.7 5.1   Microbiology Recent Results (from the past 240 hours)  Blood Culture (routine x 2)     Status: None (Preliminary result)   Collection Time: 05/19/23  5:30 AM   Specimen: BLOOD RIGHT HAND  Result Value Ref Range Status   Specimen Description BLOOD RIGHT HAND  Final   Special Requests   Final     BOTTLES DRAWN AEROBIC ONLY Blood Culture adequate volume   Culture   Final    NO GROWTH 3 DAYS Performed at [facility], [address] [place], [place] [address]    Report Status PENDING  Incomplete  Blood Culture (routine x 2)     Status: None (Preliminary result)   Collection Time: 05/19/23  5:35 AM   Specimen: BLOOD  Result Value Ref Range Status   Specimen Description BLOOD LEFT ANTECUBITAL  Final   Special Requests   Final    BOTTLES DRAWN AEROBIC AND ANAEROBIC Blood Culture adequate volume   Culture   Final    NO GROWTH 3 DAYS Performed at [facility], [address] [place], [place] [address]    Report Status PENDING  Incomplete  Resp panel by RT-PCR (RSV, Flu A&B, Covid)     Status: None   Collection Time: 05/19/23  5:41 AM   Specimen: Nasal Swab  Result Value Ref Range Status   SARS Coronavirus 2 by RT PCR NEGATIVE NEGATIVE Final   Influenza A by PCR NEGATIVE NEGATIVE Final   Influenza B by PCR NEGATIVE NEGATIVE Final    Comment: (NOTE) The Xpert Xpress SARS-CoV-2/FLU/RSV plus assay is intended as an aid in the diagnosis of influenza from Nasopharyngeal swab specimens and should not be used as a sole basis for treatment. Nasal washings and aspirates are unacceptable for Xpert Xpress SARS-CoV-2/FLU/RSV testing.  Fact Sheet for Patients: BloggerCourse.com  Fact Sheet for Healthcare Providers: SeriousBroker.it  This test is not yet approved or cleared by the [place] FDA and has been authorized for detection and/or diagnosis of SARS-CoV-2 by FDA under an Emergency Use Authorization (EUA). This EUA will remain in effect (meaning this test can be used) for the duration of the COVID-19 declaration under Section 564(b)(1) of the Act, 21 U.S.C. section 360bbb-3(b)(1), unless the authorization is terminated or revoked.     Resp Syncytial Virus by PCR NEGATIVE NEGATIVE Final    Comment:  (NOTE) Fact Sheet for Patients: BloggerCourse.com  Fact Sheet for Healthcare Providers: SeriousBroker.it  This test is not yet approved or cleared by the [place] FDA and has been authorized for detection and/or diagnosis of SARS-CoV-2 by FDA under an Emergency Use Authorization (EUA). This EUA will remain in effect (meaning this test can be used) for the duration of the COVID-19 declaration under Section 564(b)(1) of the Act, 21 U.S.C. section 360bbb-3(b)(1), unless the authorization is terminated or revoked.  Performed at [facility], [address] [place], [place] [address]   Urine Culture     Status: Abnormal   Collection Time: 05/19/23  8:59 AM   Specimen: Urine, Clean Catch  Result Value Ref Range Status   Specimen Description URINE, CLEAN CATCH  Final  Special Requests   Final    NONE Performed at [facility], [address], [place], [place] [address]    Culture 30,000 COLONIES/mL Kathryne Gin (A)  Final   Report Status 05/21/2023 FINAL  Final   Organism ID, Bacteria KLUYVERA ASCORBATA (A)  Final      Susceptibility   Kluyvera ascorbata - MIC*    AMPICILLIN 16 INTERMEDIATE Intermediate     CEFEPIME <=0.12 SENSITIVE Sensitive     CEFTRIAXONE 8 RESISTANT Resistant     CIPROFLOXACIN <=0.25 SENSITIVE Sensitive     GENTAMICIN <=1 SENSITIVE Sensitive     IMIPENEM <=0.25 SENSITIVE Sensitive     NITROFURANTOIN 64 INTERMEDIATE Intermediate     TRIMETH/SULFA <=20 SENSITIVE Sensitive     AMPICILLIN/SULBACTAM <=2 SENSITIVE Sensitive     PIP/TAZO <=4 SENSITIVE Sensitive ug/mL    * 30,000 COLONIES/mL KLUYVERA ASCORBATA  Respiratory (~20 pathogens) panel by PCR     Status: None   Collection Time: 05/19/23 11:53 AM   Specimen: Nasopharyngeal Swab; Respiratory  Result Value Ref Range Status   Adenovirus NOT DETECTED NOT DETECTED Final   Coronavirus 229E NOT DETECTED NOT DETECTED Final    Comment:  (NOTE) The Coronavirus on the Respiratory Panel, DOES NOT test for the novel  Coronavirus (2019 nCoV)    Coronavirus HKU1 NOT DETECTED NOT DETECTED Final   Coronavirus NL63 NOT DETECTED NOT DETECTED Final   Coronavirus OC43 NOT DETECTED NOT DETECTED Final   Metapneumovirus NOT DETECTED NOT DETECTED Final   Rhinovirus / Enterovirus NOT DETECTED NOT DETECTED Final   Influenza A NOT DETECTED NOT DETECTED Final   Influenza B NOT DETECTED NOT DETECTED Final   Parainfluenza Virus 1 NOT DETECTED NOT DETECTED Final   Parainfluenza Virus 2 NOT DETECTED NOT DETECTED Final   Parainfluenza Virus 3 NOT DETECTED NOT DETECTED Final   Parainfluenza Virus 4 NOT DETECTED NOT DETECTED Final   Respiratory Syncytial Virus NOT DETECTED NOT DETECTED Final   Bordetella pertussis NOT DETECTED NOT DETECTED Final   Bordetella Parapertussis NOT DETECTED NOT DETECTED Final   Chlamydophila pneumoniae NOT DETECTED NOT DETECTED Final   Mycoplasma pneumoniae NOT DETECTED NOT DETECTED Final    Comment: Performed at [facility], [address] [place], [place] [address]  C Difficile Quick Screen w PCR reflex     Status: None   Collection Time: 05/20/23  4:04 AM   Specimen: Stool  Result Value Ref Range Status   C Diff antigen NEGATIVE NEGATIVE Final   C Diff toxin NEGATIVE NEGATIVE Final   C Diff interpretation No C. difficile detected.  Final    Comment: Performed at [facility], [address], [place], [place] [address]     Time coordinating discharge: Over 30 minutes  SIGNED:   Huey Bienenstock, MD  [facility] 05/22/2023, 12:14 PM Pager   If 7PM-7AM, please contact night-coverage [email or web address] Password [email or web address]

## 2023-05-22 NOTE — Plan of Care (Signed)

## 2023-05-22 NOTE — TOC Transition Note (Signed)
 Transition of Care [facility]) - Discharge Note   Patient Details  Name: Brittany Mullins MRN: [id number] Date of Birth: [birth year]  [facility] CM/SW Contact:  Gordy Clement, RN Phone Number: 05/22/2023, 11:39 AM   Clinical Narrative:    Patient will Dc to home today. Home health will be provided by [facility]. Patient has all necessary DME  Family to transport  No additional TOC needs           Patient Goals and CMS Choice            Discharge Placement                       Discharge Plan and Services Additional resources added to the After Visit Summary for                                       Social Drivers of Health (SDOH) Interventions SDOH Screenings   Food Insecurity: No Food Insecurity (05/20/2023)  Housing: High Risk (05/20/2023)  Transportation Needs: No Transportation Needs (05/20/2023)  Utilities: Not At Risk (05/20/2023)  Tobacco Use: Low Risk  (05/20/2023)     Readmission Risk Interventions     No data to display

## 2023-05-22 NOTE — Discharge Instructions (Signed)
Follow with Primary MD in 7 days  ° °Get CBC, CMP,  checked  by Primary MD next visit.  ° ° °Activity: As tolerated with Full fall precautions use walker/cane & assistance as needed ° ° °Disposition Home  ° ° °Diet: Heart Healthy  ° ° °On your next visit with your primary care physician please Get Medicines reviewed and adjusted. ° ° °Please request your Prim.MD to go over all Hospital Tests and Procedure/Radiological results at the follow up, please get all Hospital records sent to your Prim MD by signing hospital release before you go home. ° ° °If you experience worsening of your admission symptoms, develop shortness of breath, life threatening emergency, suicidal or homicidal thoughts you must seek medical attention immediately by calling 911 or calling your MD immediately  if symptoms less severe. ° °You Must read complete instructions/literature along with all the possible adverse reactions/side effects for all the Medicines you take and that have been prescribed to you. Take any new Medicines after you have completely understood and accpet all the possible adverse reactions/side effects.  ° °Do not drive, operating heavy machinery, perform activities at heights, swimming or participation in water activities or provide baby sitting services if your were admitted for syncope or siezures until you have seen by Primary MD or a Neurologist and advised to do so again. ° °Do not drive when taking Pain medications.  ° ° °Do not take more than prescribed Pain, Sleep and Anxiety Medications ° °Special Instructions: If you have smoked or chewed Tobacco  in the last 2 yrs please stop smoking, stop any regular Alcohol  and or any Recreational drug use. ° °Wear Seat belts while driving. ° ° °Please note ° °You were cared for by a hospitalist during your hospital stay. If you have any questions about your discharge medications or the care you received while you were in the hospital after you are discharged, you can call the  unit and asked to speak with the hospitalist on call if the hospitalist that took care of you is not available. Once you are discharged, your primary care physician will handle any further medical issues. Please note that NO REFILLS for any discharge medications will be authorized once you are discharged, as it is imperative that you return to your primary care physician (or establish a relationship with a primary care physician if you do not have one) for your aftercare needs so that they can reassess your need for medications and monitor your lab values. ° °

## 2023-05-22 NOTE — Progress Notes (Addendum)
   05/22/23 0906  Mobility  Activity Ambulated with assistance in room;Ambulated independently in hallway  Level of Assistance Modified independent, requires aide device or extra time  Assistive Device Four wheel walker  Distance Ambulated (ft) 200 ft  Activity Response Tolerated fair  Mobility Referral Yes  Mobility visit 1 Mobility  Mobility Specialist Start Time (ACUTE ONLY) 0848  Mobility Specialist Stop Time (ACUTE ONLY) 0906  Mobility Specialist Time Calculation (min) (ACUTE ONLY) 18 min   Mobility Specialist: Progress Note  Pre-Mobility:      HR 84,  SpO2 98 % RA Post-Mobility:    HR 81, SpO2 96 % RA  Pt agreeable to mobility session - received in EOB. C/o RLE pain in calf muscle. Returned to Bed with all needs met - call bell within reach.   Barnie Mort, BS Mobility Specialist Please contact via SecureChat or  Rehab office at [phone].

## 2023-05-24 LAB — CULTURE, BLOOD (ROUTINE X 2)
Culture: NO GROWTH
Culture: NO GROWTH
Special Requests: ADEQUATE
Special Requests: ADEQUATE

## 2023-05-25 LAB — LEGIONELLA PNEUMOPHILA SEROGP 1 UR AG: L. pneumophila Serogp 1 Ur Ag: NEGATIVE

## 2023-05-29 ENCOUNTER — Telehealth: Payer: Self-pay | Admitting: Physical Medicine & Rehabilitation

## 2023-05-29 NOTE — Telephone Encounter (Signed)
 Brittany Mullins- aderation left vm at 12:29 pm on nurse line verbal orders for speech  1 time a  week for 8 weeks. She would like a call back for approval [phone]

## 2023-05-31 ENCOUNTER — Emergency Department (HOSPITAL_BASED_OUTPATIENT_CLINIC_OR_DEPARTMENT_OTHER): Payer: Medicare Other

## 2023-05-31 ENCOUNTER — Encounter (HOSPITAL_BASED_OUTPATIENT_CLINIC_OR_DEPARTMENT_OTHER): Payer: Self-pay | Admitting: Emergency Medicine

## 2023-05-31 ENCOUNTER — Emergency Department (HOSPITAL_BASED_OUTPATIENT_CLINIC_OR_DEPARTMENT_OTHER)
Admission: EM | Admit: 2023-05-31 | Discharge: 2023-05-31 | Discharge status: Against Medical Advice | Payer: Medicare Other | Attending: Emergency Medicine | Admitting: Emergency Medicine

## 2023-05-31 DIAGNOSIS — M79601 Pain in right arm: Secondary | ICD-10-CM | POA: Diagnosis present

## 2023-05-31 DIAGNOSIS — Z8673 Personal history of transient ischemic attack (TIA), and cerebral infarction without residual deficits: Secondary | ICD-10-CM | POA: Insufficient documentation

## 2023-05-31 DIAGNOSIS — Z5321 Procedure and treatment not carried out due to patient leaving prior to being seen by health care provider: Secondary | ICD-10-CM | POA: Insufficient documentation

## 2023-05-31 NOTE — ED Notes (Signed)
 No answer when called to room

## 2023-05-31 NOTE — ED Triage Notes (Signed)
 Right arm pain.  Recent hospitalization, CVA Pain is in back of elbow area, lump is present tender to touch Notice today

## 2023-05-31 NOTE — Telephone Encounter (Signed)
 Approval given

## 2023-06-03 ENCOUNTER — Encounter: Payer: Self-pay | Admitting: Neurology

## 2023-06-13 ENCOUNTER — Other Ambulatory Visit (HOSPITAL_COMMUNITY): Payer: Self-pay

## 2023-06-13 ENCOUNTER — Inpatient Hospital Stay: Payer: Medicare Other | Attending: Oncology | Admitting: Oncology

## 2023-06-13 ENCOUNTER — Other Ambulatory Visit: Payer: Self-pay | Admitting: Physical Medicine and Rehabilitation

## 2023-06-13 ENCOUNTER — Inpatient Hospital Stay: Payer: Medicare Other

## 2023-06-13 VITALS — BP 104/62 | HR 86 | Temp 98.6°F | Resp 17 | Wt 182.0 lb

## 2023-06-13 DIAGNOSIS — Z79899 Other long term (current) drug therapy: Secondary | ICD-10-CM | POA: Insufficient documentation

## 2023-06-13 DIAGNOSIS — Z809 Family history of malignant neoplasm, unspecified: Secondary | ICD-10-CM | POA: Diagnosis not present

## 2023-06-13 DIAGNOSIS — D689 Coagulation defect, unspecified: Secondary | ICD-10-CM

## 2023-06-13 DIAGNOSIS — I808 Phlebitis and thrombophlebitis of other sites: Secondary | ICD-10-CM | POA: Diagnosis not present

## 2023-06-13 LAB — CBC WITH DIFFERENTIAL (CANCER CENTER ONLY)
Abs Immature Granulocytes: 0.01 10*3/uL (ref 0.00–0.07)
Basophils Absolute: 0 10*3/uL (ref 0.0–0.1)
Basophils Relative: 1 %
Eosinophils Absolute: 0.1 10*3/uL (ref 0.0–0.5)
Eosinophils Relative: 1 %
HCT: 36.2 % (ref 36.0–46.0)
Hemoglobin: 12.1 g/dL (ref 12.0–15.0)
Immature Granulocytes: 0 %
Lymphocytes Relative: 29 %
Lymphs Abs: 1.7 10*3/uL (ref 0.7–4.0)
MCH: 30.6 pg (ref 26.0–34.0)
MCHC: 33.4 g/dL (ref 30.0–36.0)
MCV: 91.4 fL (ref 80.0–100.0)
Monocytes Absolute: 0.4 10*3/uL (ref 0.1–1.0)
Monocytes Relative: 7 %
Neutro Abs: 3.7 10*3/uL (ref 1.7–7.7)
Neutrophils Relative %: 62 %
Platelet Count: 240 10*3/uL (ref 150–400)
RBC: 3.96 MIL/uL (ref 3.87–5.11)
RDW: 12.4 % (ref 11.5–15.5)
WBC Count: 6 10*3/uL (ref 4.0–10.5)
nRBC: 0 % (ref 0.0–0.2)

## 2023-06-13 LAB — CMP (CANCER CENTER ONLY)
ALT: 14 U/L (ref 0–44)
AST: 21 U/L (ref 15–41)
Albumin: 3.7 g/dL (ref 3.5–5.0)
Alkaline Phosphatase: 37 U/L — ABNORMAL LOW (ref 38–126)
Anion gap: 4 — ABNORMAL LOW (ref 5–15)
BUN: 18 mg/dL (ref 6–20)
CO2: 26 mmol/L (ref 22–32)
Calcium: 8.3 mg/dL — ABNORMAL LOW (ref 8.9–10.3)
Chloride: 108 mmol/L (ref 98–111)
Creatinine: 0.73 mg/dL (ref 0.44–1.00)
GFR, Estimated: >60 mL/min (ref >60)
Glucose, Bld: 111 mg/dL — ABNORMAL HIGH (ref 70–99)
Potassium: 3.8 mmol/L (ref 3.5–5.1)
Sodium: 138 mmol/L (ref 135–145)
Total Bilirubin: 0.3 mg/dL (ref 0.0–1.2)
Total Protein: 6.3 g/dL — ABNORMAL LOW (ref 6.5–8.1)

## 2023-06-13 LAB — D-DIMER, QUANTITATIVE: D-Dimer, Quant: 2.11 ug{FEU}/mL — ABNORMAL HIGH (ref 0.00–0.50)

## 2023-06-13 NOTE — Progress Notes (Unsigned)
 [facility]  HEMATOLOGY CLINIC CONSULTATION NOTE   PATIENT NAME: Brittany Mullins   MR#: [id number] DOB: July 06, 1980  DATE OF SERVICE: 06/13/2023   REFERRING PHYSICIAN  Rickey Barbara, MD  Patient Care Team: Cain Saupe, MD as PCP - General (Family Medicine) Jerald Kief, MD as Attending Physician (Internal Medicine)   REASON FOR CONSULTATION/ CHIEF COMPLAINT: Low total protein S, with normal protein S activity  ASSESSMENT & PLAN:  Brittany Mullins is a 43 y.o. 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    No problem-specific Assessment & Plan notes found for this encounter.   I reviewed lab results and outside records for this visit and discussed relevant results with the patient. Diagnosis, plan of care and treatment options were also discussed in detail with the patient. Opportunity provided to ask questions and answers provided to her apparent satisfaction. Provided instructions to call our clinic with any problems, questions or concerns prior to return visit. I recommended to continue follow-up with PCP and sub-specialists. She verbalized understanding and agreed with the plan. No barriers to learning was detected.  Brittany Crutch, MD  06/13/2023 5:12 PM  [facility] - [facility] [address] [place] [address] Dept: [phone] Dept Fax: [phone]   HISTORY OF PRESENT ILLNESS:  Discussed the use of AI scribe software for clinical note transcription with the patient, who gave verbal consent to proceed.   She has a history of a superficial clot in the right hand,  presents with bilateral leg swelling, which is worse in the right leg. The patient reports that the swelling feels like a lot of water. The patient has tried heat and ice for the swelling and has been elevating the legs. The patient is currently on aspirin but not on other blood thinners. The patient was sent to the hematologist due to an abnormal protein S activity test result during a hospital stay in January. The patient was on birth control pills, which could have affected the protein S activity. The patient also has low protein levels in the blood, which could be contributing to the leg swelling. The patient does not drink alcohol and does not report any blood loss. The patient does not have any chest pain or trouble breathing.  She denies fever, cough, diarrhea, or other infectious symptoms.  She denies epistaxis, bloody stool, melena, hematuria, bruising or other bleeding symptoms. She also denies unintentional weight loss, night sweats or other constitutional symptoms.  MEDICAL HISTORY Past Medical History:  Diagnosis Date   Addison's disease [facility])    Brain tumor (benign) (HCC)    Chronic pancreatitis (HCC)    Hearing loss    Hypertension    Marijuana use    Per pt: "medical marijuana patient"   Pancreatitis    PCOS (polycystic ovarian syndrome)    Pituitary tumor    Seizures (HCC)      SURGICAL HISTORY Past Surgical History:  Procedure Laterality Date   ABDOMINAL HYSTERECTOMY     APPENDECTOMY     BIOPSY  08/16/2021   Procedure: BIOPSY;  Surgeon: Shellia Cleverly, DO;  Location: [facility] END[facility];  Service:  Gastroenterology;;   CESAREAN SECTION     CHOLECYSTECTOMY     ELBOW SURGERY     ESOPHAGOGASTRODUODENOSCOPY (EGD) WITH PROPOFOL N/A 08/16/2021   Procedure: ESOPHAGOGASTRODUODENOSCOPY (EGD) WITH PROPOFOL;  Surgeon: Shellia Cleverly, DO;  Location: [facility] ENDOS[facility];  Service: Gastroenterology;  Laterality: N/A;   kidney stent     pancreatic stent     RADIOLOGY WITH ANESTHESIA N/A  04/25/2023   Procedure: MRI of Brain without contrast;  Surgeon: Radiologist, Medication, MD;  Location: [facility] OR;  Service: Radiology;  Laterality: N/A;   RADIOLOGY WITH ANESTHESIA N/A 05/03/2023   Procedure: MRI WITH ANESTHESIA CERVICAL SPINE WITHOUT CONTRAST;  Surgeon: Radiologist, Medication, MD;  Location: [facility] OR;  Service: Radiology;  Laterality: N/A;     SOCIAL HISTORY: She reports that she has never smoked. She has never used smokeless tobacco. She reports that she does not currently use drugs after having used the following drugs: Marijuana. She reports that she does not drink alcohol. Social History   Socioeconomic History   Marital status: Single    Spouse name: Not on file   Number of children: 4   Years of education: Not on file   Highest education level: Not on file  Occupational History   Occupation: Astronomer  Tobacco Use   Smoking status: Never   Smokeless tobacco: Never  Vaping Use   Vaping status: Never Used  Substance and Sexual Activity   Alcohol use: No   Drug use: Not Currently    Types: Marijuana    Comment: every other day use   Sexual activity: Yes  Other Topics Concern   Not on file  Social History Narrative   Not on file   Social Drivers of Health   Financial Resource Strain: Not on file  Food Insecurity: No Food Insecurity (05/20/2023)   Hunger Vital Sign    Worried About Running Out of Food in the Last Year: Never true    Ran Out of Food in the Last Year: Never true  Transportation Needs: No Transportation Needs (05/20/2023)   PRAPARE - Administrator, Civil Service (Medical): No    Lack of Transportation (Non-Medical): No  Physical Activity: Not on file  Stress: Not on file  Social Connections: Not on file  Intimate Partner Violence: Not At Risk (05/20/2023)   Humiliation, Afraid, Rape, and Kick questionnaire    Fear of Current or Ex-Partner: No    Emotionally Abused: No    Physically Abused: No    Sexually Abused: No     FAMILY HISTORY: Her family history includes Cancer in her maternal grandfather and mother; Diabetes in her maternal grandfather and maternal grandmother; Hypertension in her mother.  CURRENT MEDICATIONS   Current Outpatient Medications  Medication Instructions   acetaminophen (TYLENOL) 650 mg, Oral, Every 6 hours PRN   ARIPiprazole (ABILIFY) 5 mg, Oral, Daily   aspirin EC 81 mg, Oral, Daily, Swallow whole.   atorvastatin (LIPITOR) 40 mg, Oral, Daily   baclofen (LIORESAL) 20 mg, 2 times daily PRN   capsicum (ZOSTRIX) 0.075 % topical cream Topical, 2 times daily, Apply to shoulders--avoid eyes and broken skin   cyclobenzaprine (FLEXERIL) 5 mg, Oral, 3 times daily PRN   gabapentin (NEURONTIN) 300 MG capsule Take 1 capsule (300 mg total) by mouth 2 (two) times daily with breakfast and with lunch   gabapentin (NEURONTIN) 400 mg, Oral, Daily at bedtime   hydrocortisone (CORTEF) 5 MG tablet Take 4 tablets (20 mg) by mouth daily in the  morning and 2 tablets (10 mg) in the evening over the next 3 days then go back to home dose of 2 tablets (10 mg) in the morning, and 1 tablet (5mg ) in the evening.   hydrOXYzine (ATARAX) 10 mg, Oral, 3 times daily PRN   lamoTRIgine (LAMICTAL) 25 mg, Oral, 2 times daily   levETIRAcetam (KEPPRA) 500 mg, Oral, Every 8 hours   methadone (DOLOPHINE) 120 mg, Daily   naloxone (NARCAN) 0.4 mg, Intravenous, As needed   pantoprazole (PROTONIX) 40 mg, Oral, Daily   polyethylene glycol (MIRALAX / GLYCOLAX) 17 g, Oral, Daily PRN   polyvinyl alcohol (LIQUIFILM TEARS) 1.4 % ophthalmic solution 1 drop, Both Eyes, 2 times daily   senna-docusate (SENOKOT-S) 8.6-50 MG tablet 2 tablets, Oral, Daily   traZODone (DESYREL) 50 mg, Oral, At bedtime PRN   VIENVA 0.1-20 MG-MCG tablet 1 tablet, Daily     ALLERGIES  She is allergic to bee venom, iodinated contrast media, other, toradol [ketorolac tromethamine], and benadryl [diphenhydramine].  REVIEW OF SYSTEMS:  Review of Systems   HENT:   Positive for trouble swallowing.   Cardiovascular:  Positive for leg swelling.  Gastrointestinal:  Positive for nausea.  Neurological:  Positive for numbness.     Rest of the pertinent review of systems is unremarkable except as mentioned above in HPI.  PHYSICAL EXAMINATION:     Onc Performance Status - 06/13/23 1443       ECOG Perf Status   ECOG Perf Status Restricted in physically strenuous activity but ambulatory and able to carry out work of a light or sedentary nature, e.g., light house work, office work      KPS SCALE   KPS % SCORE Normal activity with effort, some s/s of disease             Vitals:   06/13/23 1436  BP: 104/62  Pulse: 86  Resp: 17  Temp: 98.6 F (37 C)  SpO2: 96%   Filed Weights   06/13/23 1436  Weight: 182 lb (82.6 kg)    Physical Exam Constitutional:      General: She is not in acute distress.    Appearance: Normal appearance.  HENT:     Head: Normocephalic and atraumatic.  Eyes:     General: No scleral icterus.    Conjunctiva/sclera: Conjunctivae normal.  Cardiovascular:     Rate and Rhythm: Normal rate and regular rhythm.     Heart sounds: Normal heart sounds.  Pulmonary:     Effort: Pulmonary effort is normal.     Breath sounds: Normal breath sounds.  Abdominal:     General: There is no distension.  Musculoskeletal:     Right lower leg: Edema present.     Left lower leg: Edema present.  Neurological:     General: No focal deficit present.     Mental Status: She is alert and oriented to person, place, and time.  Psychiatric:        Mood and Affect: Mood normal.        Behavior: Behavior normal.      LABORATORY DATA:   I have reviewed the data as listed.  Results for orders placed or performed in visit on 06/13/23  D-dimer, quantitative  Result Value Ref Range   D-Dimer, Quant 2.11 (H) 0.00 - 0.50 ug/mL-FEU  CMP (Cancer Center only)  Result Value Ref Range   Sodium 138 135 - 145 mmol/L   Potassium  3.8 3.5 - 5.1 mmol/L   Chloride 108 98 -  111 mmol/L   CO2 26 22 - 32 mmol/L   Glucose, Bld 111 (H) 70 - 99 mg/dL   BUN 18 6 - 20 mg/dL   Creatinine 1.61 0.96 - 1.00 mg/dL   Calcium 8.3 (L) 8.9 - 10.3 mg/dL   Total Protein 6.3 (L) 6.5 - 8.1 g/dL   Albumin 3.7 3.5 - 5.0 g/dL   AST 21 15 - 41 U/L   ALT 14 0 - 44 U/L   Alkaline Phosphatase 37 (L) 38 - 126 U/L   Total Bilirubin 0.3 0.0 - 1.2 mg/dL   GFR, Estimated >04 >54 mL/min   Anion gap 4 (L) 5 - 15  CBC with Differential (Cancer Center Only)  Result Value Ref Range   WBC Count 6.0 4.0 - 10.5 K/uL   RBC 3.96 3.87 - 5.11 MIL/uL   Hemoglobin 12.1 12.0 - 15.0 g/dL   HCT 09.8 11.9 - 14.7 %   MCV 91.4 80.0 - 100.0 fL   MCH 30.6 26.0 - 34.0 pg   MCHC 33.4 30.0 - 36.0 g/dL   RDW 82.9 56.2 - 13.0 %   Platelet Count 240 150 - 400 K/uL   nRBC 0.0 0.0 - 0.2 %   Neutrophils Relative % 62 %   Neutro Abs 3.7 1.7 - 7.7 K/uL   Lymphocytes Relative 29 %   Lymphs Abs 1.7 0.7 - 4.0 K/uL   Monocytes Relative 7 %   Monocytes Absolute 0.4 0.1 - 1.0 K/uL   Eosinophils Relative 1 %   Eosinophils Absolute 0.1 0.0 - 0.5 K/uL   Basophils Relative 1 %   Basophils Absolute 0.0 0.0 - 0.1 K/uL   Immature Granulocytes 0 %   Abs Immature Granulocytes 0.01 0.00 - 0.07 K/uL     RADIOGRAPHIC STUDIES:  I have personally reviewed the radiological images as listed and agreed with the findings in the report.  US Venous Img Upper Uni Right(DVT) Result Date: 05/31/2023 CLINICAL DATA:  Medial elbow pain today.  Recently had an IV placed. EXAM: Right UPPER EXTREMITY VENOUS DOPPLER ULTRASOUND TECHNIQUE: Gray-scale sonography with graded compression, as well as color Doppler and duplex ultrasound were performed to evaluate the upper extremity deep venous system from the level of the subclavian vein and including the jugular, axillary, basilic, radial, ulnar and upper cephalic vein. Spectral Doppler was utilized to evaluate flow at rest and with distal augmentation  maneuvers. COMPARISON:  None Available. FINDINGS: Contralateral Subclavian Vein: Respiratory phasicity is normal and symmetric with the symptomatic side. No evidence of thrombus. Normal compressibility. Internal Jugular Vein: No evidence of thrombus. Normal compressibility, respiratory phasicity and response to augmentation. Subclavian Vein: No evidence of thrombus. Normal compressibility, respiratory phasicity and response to augmentation. Axillary Vein: No evidence of thrombus. Normal compressibility, respiratory phasicity and response to augmentation. Cephalic Vein: No evidence of thrombus. Normal compressibility, respiratory phasicity and response to augmentation. Basilic Vein: Noncompressible echogenic thrombus within the basilic vein in the mid upper arm. No color flow on Doppler imaging. Brachial Veins: No evidence of thrombus. Normal compressibility, respiratory phasicity and response to augmentation. Radial Veins: No evidence of thrombus. Normal compressibility, respiratory phasicity and response to augmentation. Ulnar Veins: No evidence of thrombus. Normal compressibility, respiratory phasicity and response to augmentation. Venous Reflux:  None visualized. Other Findings:  None visualized. IMPRESSION: Acute occlusive SVT in the basilic vein in the mid upper arm. Electronically Signed   By: Minerva Fester M.D.   On: 05/31/2023 21:05   EEG adult Result Date: 05/21/2023 Melynda Ripple,  Kristopher Oppenheim, MD     05/21/2023  6:19 PM Patient Name: Analeese Andreatta MRN: [id number] Epilepsy Attending: Charlsie Quest Referring Physician/Provider: Leroy Sea, MD Date: 05/21/2023 Duration: 24.15 mins Patient history: 43yo f with h/o epilepsy getting eeg to evaluate for seizure Level of alertness: Awake, asleep AEDs during EEG study: LEV, LTG, GBP Technical aspects: This EEG study was done with scalp electrodes positioned according to the 10-20 International system of electrode placement. Electrical activity was  reviewed with band pass filter of 1-70Hz , sensitivity of 7 uV/mm, display speed of 75mm/sec with a 60Hz  notched filter applied as appropriate. EEG data were recorded continuously and digitally stored.  Video monitoring was available and reviewed as appropriate. Description: The posterior dominant rhythm consists of 8-9 Hz activity of moderate voltage (25-35 uV) seen predominantly in posterior head regions, symmetric and reactive to eye opening and eye closing. Sleep was characterized by vertex waves, sleep spindles (12 to 14 Hz), maximal frontocentral region. Hyperventilation and photic stimulation were not performed.   IMPRESSION: This study is within normal limits. No seizures or epileptiform discharges were seen throughout the recording. A normal interictal EEG does not exclude the diagnosis of epilepsy. Charlsie Quest   DG Chest Port 1 View Result Date: 05/21/2023 CLINICAL DATA:  Shortness of breath. EXAM: PORTABLE CHEST 1 VIEW COMPARISON:  05/19/2023 FINDINGS: The lungs are clear without focal pneumonia, edema, pneumothorax or pleural effusion. The cardiopericardial silhouette is within normal limits for size. No acute bony abnormality. Telemetry leads overlie the chest. IMPRESSION: No active disease. The peribronchovascular micro nodularity seen on CT imaging 2 days ago is not evident by x-ray. Electronically Signed   By: Kennith Center M.D.   On: 05/21/2023 06:39   DG Abd Portable 1V Result Date: 05/21/2023 CLINICAL DATA:  Abdominal pain.  Constipation. EXAM: PORTABLE ABDOMEN - 1 VIEW COMPARISON:  08/18/2021 FINDINGS: Gas is seen scattered through nondilated small bowel and colon. No substantial stool volume. Surgical clips in the right upper quadrant suggest prior cholecystectomy. Visualized bony anatomy is without acute findings. IMPRESSION: Nonobstructive bowel gas pattern.  No substantial stool volume. Electronically Signed   By: Kennith Center M.D.   On: 05/21/2023 06:38   CT CHEST ABDOMEN PELVIS  WO CONTRAST Result Date: 05/19/2023 CLINICAL DATA:  Sepsis. Recent stroke. Nausea, vomiting and diarrhea. Upper abdominal pain. EXAM: CT CHEST, ABDOMEN AND PELVIS WITHOUT CONTRAST TECHNIQUE: Multidetector CT imaging of the chest, abdomen and pelvis was performed following the standard protocol without IV contrast. RADIATION DOSE REDUCTION: This exam was performed according to the departmental dose-optimization program which includes automated exposure control, adjustment of the mA and/or kV according to patient size and/or use of iterative reconstruction technique. COMPARISON:  CT scan abdomen and pelvis from 08/17/2022 and CT scan chest from 12/22/2017. FINDINGS: CT CHEST FINDINGS Cardiovascular: Normal cardiac size. No pericardial effusion. No aortic aneurysm. 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 Mediastinum/Nodes: Visualized thyroid gland appears grossly unremarkable. No solid / cystic mediastinal masses. The esophagus is nondistended precluding optimal assessment. There are few mildly prominent mediastinal lymph nodes, which do not meet the size criteria for lymphadenopathy and appear grossly similar to the prior study, favoring benign etiology. No axillary lymphadenopathy by size criteria. Evaluation of bilateral hila is limited due to lack on intravenous contrast: however, no large hilar lymphadenopathy identified. There are few calcified left hilar lymph nodes, similar to  the prior study and likely sequela of prior granulomatous infection. Lungs/Pleura: The central tracheo-bronchial tree is patent. There are multiple new small groupings of peribronchovascular solid and ground-glass opacity throughout bilateral lungs with asymmetric more involvement of middle lobe and right lower lobe. Findings are nonspecific and differential diagnosis includes multilobar pneumonia, aspiration,  atypical pneumonia, cryptogenic organizing pneumonia, etc. No pleural effusion or pneumothorax. Musculoskeletal: The visualized soft tissues of the chest wall are grossly unremarkable. No suspicious osseous lesions. CT ABDOMEN PELVIS FINDINGS Hepatobiliary: The liver is normal in size. Non-cirrhotic configuration. No suspicious mass. No intrahepatic or extrahepatic bile duct dilation. Gallbladder is surgically absent. Pancreas: Unremarkable. No pancreatic ductal dilatation or surrounding inflammatory changes. Spleen: Normal in size. There are multiple sub-5 mm calcified granulomas in the spleen. No other focal lesion. Adrenals/Urinary Tract: Adrenal glands are unremarkable. No suspicious renal mass within the limitations of this unenhanced exam. There are at least 3, sub 3 mm nonobstructing calculi each in bilateral kidneys. No ureterolithiasis or obstructive uropathy on either side. Unremarkable urinary bladder. Stomach/Bowel: No disproportionate dilation of the small or large bowel loops. No evidence of abnormal bowel wall thickening or inflammatory changes. The appendix was not visualized; however there is no acute inflammatory process in the right lower quadrant. Vascular/Lymphatic: No ascites or pneumoperitoneum. No abdominal or pelvic lymphadenopathy, by size criteria. No aneurysmal dilation of the major abdominal arteries. Reproductive: The uterus is surgically absent. No large adnexal mass. Other: There is a tiny fat containing umbilical hernia. There are soft tissue density areas in the anterior abdominal wall subcutaneous tissue, most likely due to medication injections. Musculoskeletal: No suspicious osseous lesions. IMPRESSION: 1. There are multiple new small groupings of peribronchovascular solid and ground-glass opacities throughout bilateral lungs, as described above. Findings are nonspecific and differential diagnosis includes multilobar pneumonia, aspiration, atypical pneumonia, cryptogenic  organizing pneumonia, etc. Correlate clinically. 2. No acute inflammatory process identified within the abdomen or pelvis. 3. There are bilateral, sub 3 mm, nonobstructing renal calculi. No ureterolithiasis or obstructive uropathy. 4. Multiple other nonacute observations, as described above. Electronically Signed   By: Jules Schick M.D.   On: 05/19/2023 12:30   DG Chest 2 View Result Date: 05/19/2023 CLINICAL DATA:  43 year old female with possible sepsis. EXAM: CHEST - 2 VIEW COMPARISON:  Chest x-ray 04/22/2023. FINDINGS: Lung volumes are normal. Mild elevation of the right hemidiaphragm. No consolidative airspace disease. No pleural effusions. No pneumothorax. No pulmonary nodule or mass noted. Pulmonary vasculature and the cardiomediastinal silhouette are within normal limits. IMPRESSION: 1. No radiographic evidence of acute cardiopulmonary disease. Electronically Signed   By: Trudie Reed M.D.   On: 05/19/2023 06:11   CT HEAD WO CONTRAST ( ) Result Date: 05/17/2023 CLINICAL DATA:  Stroke, follow up EXAM: CT HEAD WITHOUT CONTRAST TECHNIQUE: Contiguous axial images were obtained from the base of the skull through the vertex without intravenous contrast. RADIATION DOSE REDUCTION: This exam was performed according to the departmental dose-optimization program which includes automated exposure control, adjustment of the mA and/or kV according to patient size and/or use of iterative reconstruction technique. COMPARISON:  CT head 05/16/2023 FINDINGS: Brain: No evidence of acute large vascular territory infarction, hemorrhage, hydrocephalus, extra-axial collection or mass lesion/mass effect. Partially empty sella. Vascular: No hyperdense vessel. Skull: No acute fracture. Sinuses/Orbits: Clear sinuses.  No acute orbital findings. Other: No mastoid effusions. IMPRESSION: 1. No evidence of acute intracranial abnormality. 2. Partially empty sella, which is often a normal anatomic variant but can be associated  with idiopathic intracranial hypertension.  Electronically Signed   By: Feliberto Harts M.D.   On: 05/17/2023 21:44   CT HEAD WO CONTRAST ( ) Result Date: 05/17/2023 CLINICAL DATA:  Stroke, follow up follow up EXAM: CT HEAD WITHOUT CONTRAST TECHNIQUE: Contiguous axial images were obtained from the base of the skull through the vertex without intravenous contrast. RADIATION DOSE REDUCTION: This exam was performed according to the departmental dose-optimization program which includes automated exposure control, adjustment of the mA and/or kV according to patient size and/or use of iterative reconstruction technique. COMPARISON:  CT head May 05, 2023. FINDINGS: Brain: No evidence of new acute infarction, hemorrhage, hydrocephalus, extra-axial collection or mass lesion/mass effect. Known recent left pontine infarct without evidence of progression. Vascular: No hyperdense vessel. Skull: No acute fracture. Sinuses/Orbits: Clear sinuses.  No acute orbital findings. Other: No mastoid effusions. IMPRESSION: 1. No evidence of acute intracranial abnormality. 2. Known recent left pontine infarct without evidence of progression. Electronically Signed   By: Feliberto Harts M.D.   On: 05/17/2023 00:24     Orders Placed This Encounter  Procedures   CBC with Differential (Cancer Center Only)    Standing Status:   Future    Number of Occurrences:   1    Expiration Date:   06/12/2024   CMP (Cancer Center only)    Standing Status:   Future    Number of Occurrences:   1    Expiration Date:   06/12/2024   D-dimer, quantitative    Standing Status:   Future    Number of Occurrences:   1    Expiration Date:   06/12/2024   PROTEIN S PANEL    Standing Status:   Future    Number of Occurrences:   1    Expiration Date:   06/12/2024    Future Appointments  Date Time Provider Department Center  06/21/2023  9:30 AM Kirsteins, Victorino Sparrow, MD [facility] CPR  07/30/2023 10:30 AM Van Clines, MD [facility] None   12/19/2023  3:15 PM [facility] None  12/19/2023  3:45 PM Samanthajo Payano, Archie Patten, MD [facility] None    I spent a total of 55 minutes during this encounter with the patient including review of chart and various tests results, discussions about plan of care and coordination of care plan.  This document was completed utilizing speech recognition software. Grammatical errors, random word insertions, pronoun errors, and incomplete sentences are an occasional consequence of this system due to software limitations, ambient noise, and hardware issues. Any formal questions or concerns about the content, text or information contained within the body of this dictation should be directly addressed to the provider for clarification.

## 2023-06-14 ENCOUNTER — Encounter: Payer: Self-pay | Admitting: Oncology

## 2023-06-14 DIAGNOSIS — I809 Phlebitis and thrombophlebitis of unspecified site: Secondary | ICD-10-CM | POA: Insufficient documentation

## 2023-06-14 NOTE — Assessment & Plan Note (Signed)
-  During her hospitalization in January 2025, for presumed stroke, neurology pursued hypercoagulable workup on 04/23/2023.  Prothrombin gene mutation, factor V Leiden mutation, beta-2 glycoprotein antibodies, anticardiolipin antibodies and lupus anticoagulant were all negative.  Protein C activity, Antithrombin III activity were within normal limits.  Protein S activity was normal at 68.  Total protein S was slightly decreased at 48%.  Repeat protein S activity was normal on 04/30/2023 at 76%, free protein S level was also normal at 83%.  Total protein as was still slightly decreased at 51%.  Hence neurology suggested referral to hematology service for further evaluation.  -Slightly low total protein S level, with normal activity.  This can be potentially due to oral contraceptives or recent illness. Normal protein S activity levels indicate no current increased thrombotic risk. Re-evaluation of levels is necessary as recent hospitalization may affect accuracy.  - Repeated protein S levels today  -Rest of the hypercoagulable workup was unremarkable.  No routine indication for anticoagulation.  She can continue aspirin 81 mg daily.  I will inform patient once results are available.  For now tentatively plan to see her in 6 months for routine follow-up.  If labs remain unremarkable without any acute issues, she can be discharged from our office after next visit.

## 2023-06-14 NOTE — Assessment & Plan Note (Signed)
 Superficial thrombophlebitis in the right hand, currently managed with aspirin. No additional anticoagulation required unless deep vein thrombosis develops. - Continue aspirin therapy

## 2023-06-15 LAB — PROTEIN S PANEL
Protein S Activity: 62 % — ABNORMAL LOW (ref 63–140)
Protein S Ag, Free: 74 % (ref 61–136)
Protein S Ag, Total: 47 % — ABNORMAL LOW (ref 60–150)

## 2023-06-17 ENCOUNTER — Other Ambulatory Visit (HOSPITAL_COMMUNITY): Payer: Self-pay

## 2023-06-18 ENCOUNTER — Other Ambulatory Visit (HOSPITAL_COMMUNITY): Payer: Self-pay

## 2023-06-18 MED ORDER — AMLODIPINE BESYLATE 5 MG PO TABS
5.0000 mg | ORAL_TABLET | Freq: Every day | ORAL | 1 refills | Status: AC
  Filled 2023-06-18: qty 90, 90d supply, fill #0
  Filled 2023-09-30: qty 90, 90d supply, fill #1

## 2023-06-18 MED ORDER — LOSARTAN POTASSIUM 25 MG PO TABS
25.0000 mg | ORAL_TABLET | Freq: Every day | ORAL | 1 refills | Status: AC
  Filled 2023-06-18: qty 90, 90d supply, fill #0
  Filled 2023-09-30: qty 90, 90d supply, fill #1

## 2023-06-21 ENCOUNTER — Other Ambulatory Visit (HOSPITAL_COMMUNITY): Payer: Self-pay

## 2023-06-21 ENCOUNTER — Encounter: Payer: Medicare Other | Attending: Physical Medicine & Rehabilitation | Admitting: Physical Medicine & Rehabilitation

## 2023-06-21 ENCOUNTER — Encounter: Payer: Self-pay | Admitting: Physical Medicine & Rehabilitation

## 2023-06-21 VITALS — BP 136/80 | HR 80 | Ht 62.0 in | Wt 182.0 lb

## 2023-06-21 DIAGNOSIS — I69351 Hemiplegia and hemiparesis following cerebral infarction affecting right dominant side: Secondary | ICD-10-CM | POA: Insufficient documentation

## 2023-06-21 NOTE — Patient Instructions (Signed)
 Please use compression hose for the right lower extremity.  Swelling is common in a with limb after stroke. Also elevate the right leg if you are sitting around. Continue with your home exercises in addition to the home health therapy. Call if you need orders for outpatient therapy once you establish transportation. Follow-up with neurology Follow-up with hematology Follow-up with your primary care physician

## 2023-06-21 NOTE — Progress Notes (Signed)
 Subjective:    Patient ID: Brittany Mullins, female    DOB: 20-Oct-1980, 43 y.o.   MRN: [id number]  43 y.o. female with history of Addison's disease, pancreatitis with chronic N/B, seizures, PUD/duodenitis, chronic abdominal pain treated with methadone, bipolar disorder who was admitted on 04/22/2023 with reports of right-sided numbness as well as abnormal gait with left calf pain.  UDS positive for THC.  MRI brain done revealing acute paramedian left pontine infarct.  BLE Dopplers were negative for DVT.  She was noted to have slurred speech with intermittent aphasia and neurology recommended DAPT for stroke felt to be due to small vessel disease.  Hospital course has been significant for issues with recurrent nausea, headaches, orthostasis as well as left shoulder pains.     She did report increasing dysesthesias on 01/27 and CT head was repeated and negative for extension of hemorrhage.  Neurology was consulted for input due to reports of 2 staring episodes of unresponsiveness followed by stiffness of LUE and question of seizure.  Long-term EEG was negative for seizure and Keppra was increased to 750 mg 3 times daily per Dr. Pearlean Brownie.  Low protein as noted in neurology recommended referral to hematology.  Therapy was working with patient was limited by pain in LUE, ataxic gait, right blurry vision as well as right-sided weakness.  She was requiring min to mod assist overall.  CIR was recommended due to functional decline.  Admit date: 05/01/2023 Discharge date: 05/18/2023 HPI  Still needs help with underwear and pants otherwise  RIght shoulder pain swinging on swing  Have had PT, OT, SLP via home health.  Pt would be interested in pursuing OP PT, OT, SLP once [facility] therapy finished. Larey Seat "a couple times" Had ED admit for GI issues Has seen Hematology for low Protein S Seen by PCP after discharge,  Still going to methadone clinic  Pain Inventory Average Pain 7 Pain Right Now 7 My pain is  constant, sharp, burning, and tingling  LOCATION OF PAIN  leg and shoulder  BOWEL Number of stools per week: 7 Oral laxative use No   BLADDER Normal  Mobility use a walker ability to climb steps?  yes  Function I need assistance with the following:  dressing, bathing, meal prep, household duties, and shopping  Neuro/Psych weakness tingling trouble walking confusion anxiety  Prior Studies Any changes since last visit?  no  Physicians involved in your care Primary care Cammie Fulp, has appt Tuesday 3/25 Neurologist appt April 29   Family History  Problem Relation Age of Onset   Cancer Mother    Hypertension Mother    Diabetes Maternal Grandmother    Cancer Maternal Grandfather    Diabetes Maternal Grandfather    Social History   Socioeconomic History   Marital status: Single    Spouse name: Not on file   Number of children: 4   Years of education: Not on file   Highest education level: Not on file  Occupational History   Occupation: Astronomer  Tobacco Use   Smoking status: Never   Smokeless tobacco: Never  Vaping Use   Vaping status: Never Used  Substance and Sexual Activity   Alcohol use: No   Drug use: Not Currently    Types: Marijuana    Comment: every other day use   Sexual activity: Yes  Other Topics Concern   Not on file  Social History Narrative   Not on file   Social Drivers of Health  Financial Resource Strain: Not on file  Food Insecurity: No Food Insecurity (05/20/2023)   Hunger Vital Sign    Worried About Running Out of Food in the Last Year: Never true    Ran Out of Food in the Last Year: Never true  Transportation Needs: No Transportation Needs (05/20/2023)   PRAPARE - Administrator, Civil Service (Medical): No    Lack of Transportation (Non-Medical): No  Physical Activity: Not on file  Stress: Not on file  Social Connections: Not on file   Past Surgical History:  Procedure Laterality Date   ABDOMINAL  HYSTERECTOMY     APPENDECTOMY     BIOPSY  08/16/2021   Procedure: BIOPSY;  Surgeon: Shellia Cleverly, DO;  Location: [facility];  Service: Gastroenterology;;   CESAREAN SECTION     CHOLECYSTECTOMY     ELBOW SURGERY     ESOPHAGOGASTRODUODENOSCOPY (EGD) WITH PROPOFOL N/A 08/16/2021   Procedure: ESOPHAGOGASTRODUODENOSCOPY (EGD) WITH PROPOFOL;  Surgeon: Shellia Cleverly, DO;  Location: [facility];  Service: Gastroenterology;  Laterality: N/A;   kidney stent     pancreatic stent     RADIOLOGY WITH ANESTHESIA N/A 04/25/2023   Procedure: MRI of Brain without contrast;  Surgeon: Radiologist, Medication, MD;  Location: [facility] OR;  Service: Radiology;  Laterality: N/A;   RADIOLOGY WITH ANESTHESIA N/A 05/03/2023   Procedure: MRI WITH ANESTHESIA CERVICAL SPINE WITHOUT CONTRAST;  Surgeon: Radiologist, Medication, MD;  Location: [facility] OR;  Service: Radiology;  Laterality: N/A;   Past Medical History:  Diagnosis Date   Addison's disease (HCC)    Brain tumor (benign) (HCC)    Chronic pancreatitis (HCC)    Hearing loss    Hypertension    Marijuana use    Per pt: "medical marijuana patient"   Pancreatitis    PCOS (polycystic ovarian syndrome)    Pituitary tumor    Seizures (HCC)    BP 136/80   Pulse 80   Ht 5\' 2"  (1.575 m)   Wt 182 lb (82.6 kg)   SpO2 96%   BMI 33.29 kg/m   Opioid Risk Score:   Fall Risk Score:  `1  Depression screen [facility] 2/9     06/21/2023    9:24 AM 01/04/2018   11:38 AM 06/12/2017   10:15 AM  Depression screen PHQ 2/9  Decreased Interest 1 0 0  Down, Depressed, Hopeless 0 0 0  PHQ - 2 Score 1 0 0  Altered sleeping 2    Tired, decreased energy 0    Change in appetite 0    Feeling bad or failure about yourself  0    Trouble concentrating 2    Moving slowly or fidgety/restless 0    Suicidal thoughts 0    PHQ-9 Score 5       Review of Systems  Musculoskeletal:  Positive for gait problem.  Neurological:        Tingling  Psychiatric/Behavioral:  Positive for  confusion.   All other systems reviewed and are negative.      Objective:   Physical Exam General No acute distress Mood and affect appropriate Extremities without edema except for the right ankle Motor strength is 4 - at the right deltoid bicep tricep finger flexors and extensors. She has decreased rapidity of finger thumb opposition right upper extremity compared to left side Positive dysdiadochokinesis with rapid alternating movements right upper extremity supination pronation Right lower extremity 4/5 strength hip flexor knee extensor ankle dorsiflexor. Ambulates without assistive device for short  distances with increased base support slow cadence and reduced step length Using the rollator she is able to take larger steps increased velocity.       Assessment & Plan:  1.  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  From a functional standpoint she is doing quite well mainly having some dressing issues with underwear.  She will continue home health PT OT as well as SLP. 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 She goes to a methadone clinic and is followed up with them already. She will follow-up with hematology in September for potential coagulopathy I will see her back on a as needed basis

## 2023-06-24 ENCOUNTER — Other Ambulatory Visit (HOSPITAL_COMMUNITY): Payer: Self-pay

## 2023-06-29 ENCOUNTER — Other Ambulatory Visit (HOSPITAL_COMMUNITY): Payer: Self-pay

## 2023-07-08 ENCOUNTER — Encounter: Payer: Self-pay | Admitting: Podiatry

## 2023-07-08 ENCOUNTER — Ambulatory Visit (INDEPENDENT_AMBULATORY_CARE_PROVIDER_SITE_OTHER)

## 2023-07-08 ENCOUNTER — Ambulatory Visit (INDEPENDENT_AMBULATORY_CARE_PROVIDER_SITE_OTHER): Admitting: Podiatry

## 2023-07-08 VITALS — Ht 62.0 in | Wt 182.0 lb

## 2023-07-08 DIAGNOSIS — M76821 Posterior tibial tendinitis, right leg: Secondary | ICD-10-CM | POA: Diagnosis not present

## 2023-07-08 DIAGNOSIS — M7751 Other enthesopathy of right foot: Secondary | ICD-10-CM | POA: Diagnosis not present

## 2023-07-08 DIAGNOSIS — M24571 Contracture, right ankle: Secondary | ICD-10-CM | POA: Diagnosis not present

## 2023-07-08 DIAGNOSIS — M21371 Foot drop, right foot: Secondary | ICD-10-CM

## 2023-07-08 DIAGNOSIS — M778 Other enthesopathies, not elsewhere classified: Secondary | ICD-10-CM

## 2023-07-08 DIAGNOSIS — M2011 Hallux valgus (acquired), right foot: Secondary | ICD-10-CM | POA: Diagnosis not present

## 2023-07-08 NOTE — Progress Notes (Signed)
 Subjective:  Patient ID: Brittany Mullins, female    DOB: 22-Aug-1980,  MRN: [id number]  Chief Complaint  Patient presents with   Foot Pain    Pt is here due to right ankle and foot pain, states she had a stroke in jan 2024, didn't know she had one and was dragging her right foot, states the area where her bunion is hurts to the touch and when put pressure on it.    Discussed the use of AI scribe software for clinical note transcription with the patient, who gave verbal consent to proceed.  History of Present Illness Asuka Dusseau is a 43 year old female with a history of stroke who presents with foot pain and difficulty walking. She is accompanied by her son.  She has been experiencing foot pain and difficulty walking, which began before her stroke in January of this year. The pain is localized to her foot and ankle, accompanied by tenderness and a sensation of 'popping' during movement. Since the stroke, the pain has worsened, making ambulation more challenging.  Last summer, her boss accidentally stepped on her foot, causing significant pain, but she did not seek medical attention at that time. Since her stroke, she has experienced issues with her ankle rolling and dragging her foot while walking, leading to tripping.  She is undergoing physical therapy at home for stroke recovery, focusing on improving her walking ability and managing her foot pain.  She is currently taking Eliquis, 2.5 mg twice a day, as a blood thinner following her stroke. She uses a topical cream for pain relief, applied two to three times a day, and has tried lidocaine patches. In addition to her stroke, she has a history of a pituitary tumor and Addison's disease. No history of gout or low back issues, although she experienced some lower back pain recently.      Objective:    Physical Exam VASCULAR: DP and PT pulse palpable. Foot is warm and well-perfused. Capillary fill time is  brisk. DERMATOLOGIC: Normal skin turgor, texture, and temperature. No open lesions, rashes, or ulcerations. NEUROLOGIC: Normal sensation to light touch and pressure. No paresthesias on examination. ORTHOPEDIC: Hallux valgus deformity on right foot with pain on palpation over medial eminence. Pain over posterior tibial tendon. Equinus contracture of digits and ankle. 3/5 strength in dorsiflexion and plantarflexion on right side compared to left. No ecchymosis or bruising.   No images are attached to the encounter.    Results RADIOLOGY Radiographs of the right foot and ankle: No acute osseous abnormality. Equinus contracture positioning and hallux valgus deformity. (07/08/2023)   Assessment:   1. Equinus contracture of right ankle   2. Hallux valgus, right   3. Posterior tibial tendinitis of right lower extremity   4. Foot drop, right      Plan:  Patient was evaluated and treated and all questions answered.  Assessment and Plan Assessment & Plan Bunion (Hallux Valgus) Pre-existing hallux valgus deformity on the right foot has worsened, with pain over the medial eminence likely due to nerve compression from shoe pressure. Exacerbated by altered gait post-stroke. Surgery is not recommended due to high risk with blood thinners and recent stroke. Conservative management is preferred, with potential symptom improvement as she progresses through therapy and adapts to her new gait. - Recommend wearing shoes with adequate room and width, such as orthopedic shoes from brands like [employer] or [employer]. - Consider a brace to maintain foot position and prevent dragging and tripping. - Avoid surgery  due to high risk with blood thinners and recent stroke. - Consider cortisone injections if pain and inflammation persist despite conservative measures.  Tendonitis Tendonitis in the posterior tibial tendon and inside of the ankle, likely due to muscle imbalance and altered gait post-stroke. Causes pain and  contributes to foot's contracted positioning. Conservative management with topical treatments and physical therapy to address inflammation and muscle imbalance is preferred. - Apply Voltaren gel topically up to two or three times daily to reduce inflammation. - Continue current topical cream as prescribed. - Apply lidocaine patches to numb the area and reduce pain. - Use ice to reduce inflammation and avoid heat to prevent swelling. - Refer to [facility] for physical therapy focusing on foot and ankle strengthening and range of motion exercises. - Consider cortisone injections if symptoms persist despite conservative measures.  Foot Drop Foot drop likely secondary to stroke, resulting in difficulty walking and tripping. Due to muscle weakness and imbalance, with 3+ out of 5 strength in dorsiflexion and plantarflexion on the right side. A foot drop brace is recommended to improve gait and prevent tripping. Brace use may be reduced as she progresses through rehabilitation. - Prescribe a foot drop brace from [facility]. - Advise wearing the brace while walking to prevent tripping and improve gait. - Delay purchasing new shoes until the brace is fitted for proper accommodation.  Stroke Stroke in January 2025, likely due to a clot associated with calcium deposits. Resulted in significant changes in muscle function and gait, contributing to current foot and ankle issues. Continued anticoagulation therapy and physical therapy are essential for recovery and prevention of further complications. - Continue anticoagulation therapy with Eliquis. - Engage in physical therapy to improve muscle function and gait.  Addison's Disease Addison's disease may impact overall health and recovery.  Pituitary Tumor Pituitary tumor may impact overall health and recovery.  Follow-up Ongoing management and follow-up required for foot and ankle issues, as well as overall health conditions. - Fax referral order to  [facility] for physical therapy focusing on foot and ankle. - Provide prescription for foot drop brace from [facility]. - Advise follow-up if symptoms do not improve or worsen, considering cortisone injection if necessary.      Return if symptoms worsen or fail to improve.

## 2023-07-10 ENCOUNTER — Telehealth: Payer: Self-pay | Admitting: Physical Medicine & Rehabilitation

## 2023-07-10 DIAGNOSIS — I6302 Cerebral infarction due to thrombosis of basilar artery: Secondary | ICD-10-CM

## 2023-07-10 NOTE — Telephone Encounter (Signed)
 Brittany Mullins from speech aderation called and stated pt is requesting [facility] for therapy. She stated a referral needs to be sent over   Outpatient [facility] number: [phone]

## 2023-07-11 NOTE — Telephone Encounter (Signed)
 [facility] doesn't have availability for therapy can you please send this to [facility].

## 2023-07-12 ENCOUNTER — Telehealth: Payer: Self-pay

## 2023-07-12 NOTE — Telephone Encounter (Signed)
 Referral for PT, office note, and demographics faxed to [facility]

## 2023-07-12 NOTE — Telephone Encounter (Signed)
-----   Message from Edwin Cap sent at 07/09/2023  7:26 AM EDT ----- Can you send this PT referral to [facility] (I think that's what she said it was who is already coming to her house)?

## 2023-07-18 ENCOUNTER — Ambulatory Visit (INDEPENDENT_AMBULATORY_CARE_PROVIDER_SITE_OTHER): Admitting: Cardiology

## 2023-07-18 VITALS — BP 114/68 | HR 88 | Ht 62.0 in | Wt 193.7 lb

## 2023-07-18 DIAGNOSIS — Z8639 Personal history of other endocrine, nutritional and metabolic disease: Secondary | ICD-10-CM | POA: Diagnosis not present

## 2023-07-18 DIAGNOSIS — I6302 Cerebral infarction due to thrombosis of basilar artery: Secondary | ICD-10-CM | POA: Diagnosis not present

## 2023-07-18 NOTE — Progress Notes (Signed)
 Cardiology Office Note:  .   Date:  07/18/2023  ID:  Brittany Mullins, DOB 1981/02/26, MRN [id number] PCP: Berneda Bridges, MD  [facility] Providers Cardiologist:  None     History of Present Illness: .   Brittany Mullins is a 43 y.o. female Discussed the use of AI scribe software for clinical note transcription with the patient, who gave verbal consent to proceed.  History of Present Illness Brittany Mullins is a 43 year old female with Addison's disease and chronic pancreatitis who presents with elevated BNP and concerns of heart-related symptoms.  She has been experiencing significant swelling in her legs and rapid weight gain, increasing from 145 pounds to nearly 200 pounds, following a stroke at the end of January 2025. The swelling began after returning home from the hospital, where she did not experience any swelling. Compression stockings have been used, providing some relief.  She experiences chest pain and significant shortness of breath. Her BNP level was checked and found to be 45, which is within normal limits. An echocardiogram performed in January 2025 showed a normal ejection fraction of 65% with no valvular abnormalities.  She has a history of Addison's disease and was not on steroids prior to her recent hospitalization. During her hospital stay, her steroid dosage was increased, which she suspects may be contributing to her weight gain and swelling.  She has been on methadone for chronic pain management. She also has a history of chronic pancreatitis, chronic nausea, vomiting, hypertension, seizures, prolactinoma, and bipolar disorder.  She has seen a hematologist for protein S deficiency, which is associated with an increased risk of blood clots and strokes.      Studies Reviewed: .        Results LABS BNP: 45 (07/18/2023)  DIAGNOSTIC Echocardiogram: Normal ejection fraction of 65%, no valvular abnormalities (04/2023) Risk  Assessment/Calculations:            Physical Exam:   VS:  BP 114/68   Pulse 88   Ht 5\' 2"  (1.575 m)   Wt 193 lb 11.2 oz (87.9 kg)   SpO2 98%   BMI 35.43 kg/m    Wt Readings from Last 3 Encounters:  07/18/23 193 lb 11.2 oz (87.9 kg)  07/08/23 182 lb (82.6 kg)  06/21/23 182 lb (82.6 kg)    GEN: Well nourished, well developed in no acute distress, moon like facies. NECK: No JVD; No carotid bruits CARDIAC: RRR, no murmurs, no rubs, no gallops RESPIRATORY:  Clear to auscultation without rales, wheezing or rhonchi  ABDOMEN: Soft, non-tender, non-distended EXTREMITIES:  Mild LE edema; No deformity   ASSESSMENT AND PLAN: .    Assessment and Plan Assessment & Plan Chest pain and dyspnea Experiencing chest pain and significant dyspnea. Heart failure ruled out based on normal BNP levels and recent echocardiogram with normal ejection fraction. Symptoms may be related to weight gain and edema, potentially linked to steroid use for Addison's disease. - Consider refer to endocrinologist for further evaluation.  Peripheral edema and weight gain Significant peripheral edema and weight gain from 145 to nearly 200 pounds since stroke in January. Compression stockings have alleviated swelling. Heart failure ruled out with normal BNP and echocardiogram showing 65% ejection fraction and no valvular abnormalities. 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 Advised against discontinuing steroids without further evaluation. - Refer to endocrinologist for evaluation of steroid use and potential Cushing's syndrome.  Addison's disease Currently on steroid treatment for Addison's disease.  Suspected recent increase in steroid dosage, possibly during stroke hospitalization, contributing to weight gain and edema, potentially leading to Cushing's syndrome. Advised endocrinologist consultation to assess steroid dosage and manage Addison's disease. - Refer to  endocrinologist to assess steroid dosage and manage Addison's disease.  Protein S deficiency Protein S deficiency increasing risk of thromboembolic events. Under hematologist management. - Continue management with hematologist.          Signed, Dorothye Gathers, MD

## 2023-07-18 NOTE — Patient Instructions (Signed)
 Medication Instructions:  Your physician recommends that you continue on your current medications as directed. Please refer to the Current Medication list given to you today.  Follow-Up: At [facility], you and your health needs are our priority.  As part of our continuing mission to provide you with exceptional heart care, our providers are all part of one team.  This team includes your primary Cardiologist (physician) and Advanced Practice Providers or APPs (Physician Assistants and Nurse Practitioners) who all work together to provide you with the care you need, when you need it.  Your next appointment:   Call us if you need [place]

## 2023-07-25 ENCOUNTER — Ambulatory Visit: Attending: Physical Medicine & Rehabilitation

## 2023-07-25 ENCOUNTER — Telehealth: Payer: Self-pay | Admitting: Physical Medicine & Rehabilitation

## 2023-07-25 ENCOUNTER — Other Ambulatory Visit: Payer: Self-pay

## 2023-07-25 DIAGNOSIS — I6302 Cerebral infarction due to thrombosis of basilar artery: Secondary | ICD-10-CM

## 2023-07-25 DIAGNOSIS — R4701 Aphasia: Secondary | ICD-10-CM | POA: Diagnosis present

## 2023-07-25 DIAGNOSIS — R471 Dysarthria and anarthria: Secondary | ICD-10-CM | POA: Diagnosis present

## 2023-07-25 DIAGNOSIS — R41841 Cognitive communication deficit: Secondary | ICD-10-CM | POA: Diagnosis present

## 2023-07-25 NOTE — Therapy (Signed)
 OUTPATIENT SPEECH LANGUAGE PATHOLOGY EVALUATION   Patient Name: Brittany Mullins MRN: [id number] DOB:May 11, 1980, 43 y.o., female Today's Date: 07/25/2023  PCP: Berneda Bridges, MD REFERRING PROVIDER: Genetta Kenning, MD  END OF SESSION:  End of Session - 07/25/23 2303     Visit Number 1    Number of Visits 17    Date for SLP Re-Evaluation 10/11/23   first ST 08/14/23   SLP Start Time 1621    SLP Stop Time  1702    SLP Time Calculation (min) 41 min    Activity Tolerance Patient tolerated treatment well             Past Medical History:  Diagnosis Date   Addison's disease (HCC)    Brain tumor (benign) (HCC)    Chronic pancreatitis (HCC)    Hearing loss    Hypertension    Marijuana use    Per pt: "medical marijuana patient"   Pancreatitis    PCOS (polycystic ovarian syndrome)    Pituitary tumor    Seizures (HCC)    Past Surgical History:  Procedure Laterality Date   ABDOMINAL HYSTERECTOMY     APPENDECTOMY     BIOPSY  08/16/2021   Procedure: BIOPSY;  Surgeon: Annis Kinder, DO;  [facility]: [facility];  Service: Gastroenterology;;   CESAREAN SECTION     CHOLECYSTECTOMY     ELBOW SURGERY     ESOPHAGOGASTRODUODENOSCOPY (EGD) WITH PROPOFOL  N/A 08/16/2021   Procedure: ESOPHAGOGASTRODUODENOSCOPY (EGD) WITH PROPOFOL ;  Surgeon: Annis Kinder, DO;  Location: [facility];  Service: Gastroenterology;  Laterality: N/A;   kidney stent     pancreatic stent     RADIOLOGY WITH ANESTHESIA N/A 04/25/2023   Procedure: MRI of Brain without contrast;  Surgeon: Radiologist, Medication, MD;  Location: [facility];  Service: Radiology;  Laterality: N/A;   RADIOLOGY WITH ANESTHESIA N/A 05/03/2023   Procedure: MRI WITH ANESTHESIA CERVICAL SPINE WITHOUT CONTRAST;  Surgeon: Radiologist, Medication, MD;  Location: [facility];  Service: Radiology;  Laterality: N/A;   Patient Active Problem List   Diagnosis Date Noted   Superficial thrombophlebitis 06/14/2023   Low protein S  05/19/2023   Sepsis (HCC) 05/19/2023   Blurry vision, left eye 05/19/2023   Nephrolithiasis 05/19/2023   Dysphagia 05/19/2023   Seizure (HCC) 05/19/2023   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   HTN (hypertension) 04/23/2023   Stroke (cerebrum) (HCC) 04/22/2023   Dizziness 04/22/2023   Intractable vomiting with nausea 08/17/2022   Abdominal pain, epigastric    Gastritis and gastroduodenitis    AKI (acute kidney injury) (HCC) 08/15/2021   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   Intractable nausea and vomiting with acute abdominal pain 10/25/2019   History of Addison's disease    Intractable cyclical vomiting with nausea    Chronic pain disorder 06/03/2017   Opioid dependence (HCC) 06/03/2017   Hypokalemia/hypomangesia 06/03/2017   Dehydration 06/03/2017   Generalized abdominal pain    Marijuana use    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 "Now I can only do  one of those." "I feel like I'm in a fog and my memory is real bad."  Pt accompanied by: self  PERTINENT HISTORY: presented to ED 04/22/23 with right sided weakness, facial numbness and asymmetry. 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 PMHx Addison's disease, brain tumor (benign), Hypertension, Marijuana use, opiod dependence - methadone , Pancreatitis, PCOS (polycystic ovarian syndrome), Pituitary tumor, and Seizures.   PAIN:  Are you having pain? Yes: NPRS scale: 5/10 Pain location: rt shoulder Pain  description: stabbing/throbbing Aggravating factors: movement raising hand Relieving factors: heat  FALLS: Has patient fallen in last 6 months?  Yes, 3. One Friday 07/19/23 ue to seizure  LIVING ENVIRONMENT: Lives with: lives with an adult companion Lives in: House/apartment  PLOF:  Level of assistance: Independent with ADLs, Independent with IADLs Employment: On disability (Addison's Syndrome)  PATIENT GOALS: "I want my memory to be better."  OBJECTIVE:  Note: Objective measures were completed at Evaluation unless otherwise noted.  DIAGNOSTIC FINDINGS:  MR 04/22/23 IMPRESSION: Acute left pontine infarct.     COGNITION: Overall cognitive status: Impaired Areas of impairment:  Attention: Impaired: Alternating, Divided Memory: Impaired: Short term Functional deficits: Pt now requires help with meds in a med box from a family member, she didn't remember which clinic the eval today was at so she provided the wrong address for her transportation today, she used to pay the bills but now requires assistance from fiancee who has taken over paying bills. Lastly, she stated "s" statement re: divided attention.   VERBAL EXPRESSION: Level of generative/spontaneous verbalization: conversation Automatic speech: month of year: intact  Repetition: Impaired: sentence Naming: Divergent: 76-100% Pragmatics: Appears intact Comments: Pt endorses anomia today approx 3 times/week; anomia did not occur in eval today Interfering components: attention and speech intelligibility Effective technique:  extra time is helpful, pt reports   WRITTEN EXPRESSION: Dominant hand: right Written expression: Appears intact (linguistically)  MOTOR SPEECH: Overall motor speech: impaired Level of impairment: Phrase Respiration: thoracic breathing and diaphragmatic/abdominal breathing Phonation: normal Resonance: WFL Articulation: Impaired: phrase, sentence, and conversation Intelligibility:  Intelligible Motor planning: Impaired: aware and consistent Motor speech errors: aware, consistent, and inconsistent Effective technique:  pt reports she is happy with speech, currently  ORAL MOTOR EXAMINATION: Overall status: Impaired: Labial: Right (Strength and Coordination) Lingual: Right (Strength and Coordination) Comments: mild strength deficit with mild-mod coordination deficit  STANDARDIZED ASSESSMENTS: Hopkins Verbal Learning Test - RECALL - 3/12 (below WNL), 5/12 (WNL),  and 6/12 (below WNL). RECOGNITION: 9 (below WNL). This indicates pt may have memory encoding deficit.  PATIENT REPORTED OUTCOME MEASURES (PROM): Cognitive Function: to be provided in first 3 sessions                                                                                                                            TREATMENT DATE:   07/25/23: SLP shared role of SLP in ST rehab for cognition and for aphasia. SLP ascertained pt was satisfied with her speech  currently and would prefer to work on cognition and language. SLP shared general goals with pt and she was amenable to these. SLP talked with pt about some compensations to attempt at home such as using her phone for reminders and alarms to assist in recall. Lastly, SLP told pt to track her incidents of anomia and to share these with SLP.  PATIENT EDUCATION: Education details: see "treatment date" above Person educated: Patient Education method: Medical illustrator Education comprehension: verbalized understanding, returned demonstration, and needs further education   GOALS: Goals reviewed with patient? Yes  SHORT TERM GOALS: Target date: 09/13/23 (first ST 08/14/23)  Pt will tell SLP three trained anomia strategies Baseline: Goal status: INITIAL  2.  Pt will use trained one memory compensation successfully between 3 sessions Baseline:  Goal status: INITIAL  3.  Pt will demo WFL alternating attention between two simple tasks in 3  sessions with mod I Baseline:  Goal status: INITIAL  4.  Pt will bring planner to at least 5 sessions and demo [facility] appointment tracking in 4/5 of those sessions Baseline:  Goal status: INITIAL  5.  Pt will complete CLQT or FAVRES Baseline:  Goal status: INITIAL   LONG TERM GOALS: Target date: 10/11/23  Pt will improve PROM compared to iniital administration Baseline:  Goal status: INITIAL  2.  Pt will use/report using trained anomia strategies successfully in/between 3 sessions Baseline:  Goal status: INITIAL  3.  Pt will use/report use of at least two trained memory compensations successfully in/between 3 sessions Baseline:  Goal status: INITIAL  4.  Pt will pay at least 3 bills successfully using compensations Baseline:  Goal status: INITIAL  5.  Pt will demo WFL alternating attention in two min-mod complex tasks in 3 sessions Baseline:  Goal status: INITIAL  6.  Pt will use compensations for anomia successfully in 15 minutes mod complex conversation in 3 sessions. Baseline:  Goal status: INITIAL     ASSESSMENT:  CLINICAL IMPRESSION: Patient is a 43 y.o. F who was seen today for assessment of cognitive communication following pontine CVA in January 2025. Pt endorses some occasional aphasia (anomia) approx every other day. See "cognition" above for how deficits impact her day to day life. At this time pt is satisfied with her speech which is mildly dysarthric but 100% intelligible.   OBJECTIVE IMPAIRMENTS: include attention, memory, aphasia, and dysarthria. These impairments are limiting patient from managing medications, managing appointments, managing finances, household responsibilities, ADLs/IADLs, and effectively communicating at home and in community. Factors affecting potential to achieve goals and functional outcome are ability to learn/carryover information and severity of impairments. Patient will benefit from skilled SLP services to address above impairments  and improve overall function. Pt may require an assessment of aphasia or of word finding during this therapy course.  REHAB POTENTIAL: Good  PLAN:  SLP FREQUENCY: 2x/week  SLP DURATION: 8 weeks  PLANNED INTERVENTIONS: Language facilitation, Environmental controls, Cueing hierachy, Cognitive reorganization, Internal/external aids, Oral motor exercises, Functional tasks, Multimodal communication approach, SLP instruction and feedback, Compensatory strategies, Patient/family education, and [address] Treatment of speech (30 or 45 min)     Brittany Mullins, CCC-SLP 07/25/2023, 11:11 PM

## 2023-07-25 NOTE — Telephone Encounter (Signed)
-----   Message from Brittany Mullins sent at 07/25/2023  4:27 PM EDT ----- Hello Brittany Mullins is here with me now and Brittany Mullins would also like to have PT and OT referrals to further her rehab journey. Brittany Mullins just finished HH ST, OT, and PT last week.   If agreed could you route those referrals to OP Rehab at [facility]?   Thanks so much! Daphney Eans, SLP   [facility] Outpatient Rehab at [facility] [address] [place], [place] [address]  P: [phone] F: [phone]

## 2023-07-30 ENCOUNTER — Encounter: Payer: Self-pay | Admitting: Neurology

## 2023-07-30 ENCOUNTER — Ambulatory Visit (INDEPENDENT_AMBULATORY_CARE_PROVIDER_SITE_OTHER): Admitting: Neurology

## 2023-07-30 VITALS — BP 120/77 | HR 100 | Ht 63.0 in | Wt 190.6 lb

## 2023-07-30 DIAGNOSIS — I639 Cerebral infarction, unspecified: Secondary | ICD-10-CM | POA: Diagnosis not present

## 2023-07-30 DIAGNOSIS — F445 Conversion disorder with seizures or convulsions: Secondary | ICD-10-CM | POA: Diagnosis not present
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
## 2023-07-30 NOTE — Patient Instructions (Signed)
 Good to meet you.  Continue all your medications  2. Continue PT, OT, ST  3. Referral will be sent to [facility] to help reduce the frequency of the stress seizures  4. Follow-up in 3-4 months, call for any changes

## 2023-07-31 ENCOUNTER — Encounter (HOSPITAL_COMMUNITY): Payer: Self-pay | Admitting: Emergency Medicine

## 2023-07-31 ENCOUNTER — Emergency Department (HOSPITAL_COMMUNITY)
Admission: EM | Admit: 2023-07-31 | Discharge: 2023-07-31 | Disposition: A | Discharge status: Home / Self Care | Attending: Emergency Medicine | Admitting: Emergency Medicine

## 2023-07-31 ENCOUNTER — Other Ambulatory Visit: Payer: Self-pay

## 2023-07-31 DIAGNOSIS — R569 Unspecified convulsions: Secondary | ICD-10-CM | POA: Diagnosis present

## 2023-07-31 LAB — CBC
HCT: 40.1 % (ref 36.0–46.0)
Hemoglobin: 13.1 g/dL (ref 12.0–15.0)
MCH: 30.6 pg (ref 26.0–34.0)
MCHC: 32.7 g/dL (ref 30.0–36.0)
MCV: 93.7 fL (ref 80.0–100.0)
Platelets: 247 10*3/uL (ref 150–400)
RBC: 4.28 MIL/uL (ref 3.87–5.11)
RDW: 11.9 % (ref 11.5–15.5)
WBC: 5.9 10*3/uL (ref 4.0–10.5)
nRBC: 0 % (ref 0.0–0.2)

## 2023-07-31 LAB — BASIC METABOLIC PANEL WITH GFR
Anion gap: 10 (ref 5–15)
BUN: 14 mg/dL (ref 6–20)
CO2: 24 mmol/L (ref 22–32)
Calcium: 8.7 mg/dL — ABNORMAL LOW (ref 8.9–10.3)
Chloride: 103 mmol/L (ref 98–111)
Creatinine, Ser: 0.63 mg/dL (ref 0.44–1.00)
GFR, Estimated: >60 mL/min (ref >60)
Glucose, Bld: 77 mg/dL (ref 70–99)
Potassium: 4 mmol/L (ref 3.5–5.1)
Sodium: 137 mmol/L (ref 135–145)

## 2023-07-31 LAB — CBG MONITORING, ED: Glucose-Capillary: 77 mg/dL (ref 70–99)

## 2023-07-31 MED ORDER — ARIPIPRAZOLE 5 MG PO TABS: 5.0000 mg | ORAL_TABLET | Freq: Every day | ORAL | 0 refills | Status: AC

## 2023-07-31 MED ORDER — ARIPIPRAZOLE 10 MG PO TABS
5.0000 mg | ORAL_TABLET | Freq: Once | ORAL | Status: AC
Start: 2023-07-31 — End: 2023-07-31
  Administered 2023-07-31: 5 mg via ORAL
  Filled 2023-07-31: qty 1

## 2023-07-31 NOTE — ED Notes (Signed)
 IV team at bedside

## 2023-07-31 NOTE — ED Triage Notes (Addendum)
 PT came in for 2 seizures today.  She says she saw her neurologists yesterday who told her she needs to be back on her Abilify  b/c it is helping with seizures in combination with her seizure meds (Keppra ). She tried to get PCP to help with Abilify  but they weren't responding so she came here to get help faster.  She is not postictal but she feels foggy headed.

## 2023-07-31 NOTE — ED Provider Notes (Signed)
 [place] EMERGENCY DEPARTMENT AT [facility] Provider Note   CSN: [id number] Arrival date & time: 07/31/23  1705     History No chief complaint on file.   HPI Brittany Mullins is a 43 y.o. female presenting for seizure episodes x2 today. 8am and 11am 5 minutes in length. Postictal initially now back to baseline. Called PCP but no answer Saw her neurologist yesterday and was told these are nonepileptic and to restart abilify  Called PCP today to get back on abilify  but no answer so she comes here for refil. Patient's recorded medical, surgical, social, medication list and allergies were reviewed in the Snapshot window as part of the initial history.   Review of Systems   Review of Systems  Constitutional:  Negative for chills and fever.  HENT:  Negative for ear pain and sore throat.   Eyes:  Negative for pain and visual disturbance.  Respiratory:  Negative for cough and shortness of breath.   Cardiovascular:  Negative for chest pain and palpitations.  Gastrointestinal:  Negative for abdominal pain and vomiting.  Genitourinary:  Negative for dysuria and hematuria.  Musculoskeletal:  Negative for arthralgias and back pain.  Skin:  Negative for color change and rash.  Neurological:  Positive for seizures. Negative for syncope.  Psychiatric/Behavioral:  Positive for agitation.   All other systems reviewed and are negative.   Physical Exam Updated Vital Signs BP (!) 144/88   Pulse 78   Temp 98.2 F (36.8 C) (Oral)   Resp 18   Ht 5\' 3"  (1.6 m)   Wt 86.2 kg   SpO2 99%   BMI 33.66 kg/m  Physical Exam Vitals and nursing note reviewed.  Constitutional:      General: She is not in acute distress.    Appearance: She is well-developed.  HENT:     Head: Normocephalic and atraumatic.  Eyes:     Conjunctiva/sclera: Conjunctivae normal.  Cardiovascular:     Rate and Rhythm: Normal rate and regular rhythm.     Heart sounds: No murmur heard. Pulmonary:      Effort: Pulmonary effort is normal. No respiratory distress.     Breath sounds: Normal breath sounds.  Abdominal:     General: There is no distension.     Palpations: Abdomen is soft.     Tenderness: There is no abdominal tenderness. There is no right CVA tenderness or left CVA tenderness.  Musculoskeletal:        General: No swelling or tenderness. Normal range of motion.     Cervical back: Neck supple.  Skin:    General: Skin is warm and dry.  Neurological:     General: No focal deficit present.     Mental Status: She is alert and oriented to person, place, and time. Mental status is at baseline.     Cranial Nerves: No cranial nerve deficit.      ED Course/ Medical Decision Making/ A&P    Procedures Procedures   Medications Ordered in ED Medications  ARIPiprazole  (ABILIFY ) tablet 5 mg (5 mg Oral Given 07/31/23 1831)    Medical Decision Making:   Brittany Mullins is a 43 y.o. female who presented to the ED today with seizure episode detailed above.    Patient placed on continuous vitals and telemetry monitoring while in ED which was reviewed periodically.  Complete initial physical exam performed, notably the patient  was hemodynamic stable no acute distress.    Reviewed and confirmed nursing documentation for past  medical history, family history, social history.    Initial Assessment:   With the patient's presentation of seizure episode, most likely diagnosis is nonepileptic spells versus breakthrough seizures. Other diagnoses were considered including (but not limited to) intracranial mass, intracranial hemorrhage, critical metabolic crisis. These are considered less likely due to history of present illness and physical exam findings.   This is most consistent with an acute life/limb threatening illness complicated by underlying chronic conditions.  Initial Plan:  Screening labs including CBC and Metabolic panel to evaluate for infectious or metabolic etiology of  disease.  Objective evaluation as below reviewed   Initial Study Results:   Laboratory  All laboratory results reviewed without evidence of clinically relevant pathology.    Reassessment and Plan:   Patient observed for 3-1/2 hours.  No further seizure episodes.  Back at baseline.  Patient was comfortable with the outpatient setting.  Refill of Abilify  sent until she can seen by her PCP.  Patient's description of the episode is more consistent with nonepileptic spells given patient's recollection, being triggered by anxiety, prodrome and lack of postictal period.   Disposition:  I have considered need for hospitalization, however, considering all of the above, I believe this patient is stable for discharge at this time.  Patient/family educated about specific return precautions for given chief complaint and symptoms.  Patient/family educated about follow-up with PCP.     Patient/family expressed understanding of return precautions and need for follow-up. Patient spoken to regarding all imaging and laboratory results and appropriate follow up for these results. All education provided in verbal form with additional information in written form. Time was allowed for answering of patient questions. Patient discharged.    Emergency Department Medication Summary:   Medications  ARIPiprazole  (ABILIFY ) tablet 5 mg (5 mg Oral Given 07/31/23 1831)         Clinical Impression:  1. Seizure-like activity [facility])      Discharge   Final Clinical Impression(s) / ED Diagnoses Final diagnoses:  Seizure-like activity (HCC)    Rx / DC Orders ED Discharge Orders          Ordered    ARIPiprazole  (ABILIFY ) 5 MG tablet  Daily        07/31/23 1751              Brittany Bile, MD 07/31/23 2029

## 2023-07-31 NOTE — ED Notes (Signed)
 Patient discharged in stable condition.

## 2023-07-31 NOTE — ED Provider Triage Note (Signed)
 Emergency Medicine Provider Triage Evaluation Note  Brittany Mullins , a 43 y.o. female  was evaluated in triage.  Pt complains of seizure today x 2.  These occured around 9 AM and then around noon.  States that she recently saw her neurologist.  States that she was supposed to restart Abilify  which she has been recently off of.  She does take levetiracetam .  Previous seizure about a month ago.  She has a history of CVA.  Currently feels tired but close to baseline.  Review of Systems  Positive: Seizure Negative: Fever  Physical Exam  BP (!) 139/96 (BP Location: Right Arm)   Pulse 96   Temp 98.1 F (36.7 C)   Resp 18   Ht 5\' 3"  (1.6 m)   Wt 86.2 kg   SpO2 100%   BMI 33.66 kg/m  Gen:   Awake, no distress   Resp:  Normal effort  MSK:   Moves extremities without difficulty  Other:    Medical Decision Making  Medically screening exam initiated at 5:25 PM.  Appropriate orders placed.  Brittany Mullins was informed that the remainder of the evaluation will be completed by another provider, this initial triage assessment does not replace that evaluation, and the importance of remaining in the ED until their evaluation is complete.     Brittany Sandhoff, PA-C 07/31/23 1727

## 2023-08-05 ENCOUNTER — Ambulatory Visit

## 2023-08-12 ENCOUNTER — Telehealth: Payer: Self-pay | Admitting: Podiatry

## 2023-08-12 NOTE — Telephone Encounter (Signed)
 Brittany Mullins calling following up on forms they faxed over for AFO brace.They state they faxed [phone]

## 2023-08-14 ENCOUNTER — Telehealth: Payer: Self-pay | Admitting: Oncology

## 2023-08-14 ENCOUNTER — Ambulatory Visit: Attending: Physical Medicine & Rehabilitation

## 2023-08-14 DIAGNOSIS — R41841 Cognitive communication deficit: Secondary | ICD-10-CM | POA: Insufficient documentation

## 2023-08-14 DIAGNOSIS — R4701 Aphasia: Secondary | ICD-10-CM | POA: Insufficient documentation

## 2023-08-14 DIAGNOSIS — R471 Dysarthria and anarthria: Secondary | ICD-10-CM | POA: Diagnosis present

## 2023-08-14 NOTE — Telephone Encounter (Signed)
 Brittany Mullins

## 2023-08-14 NOTE — Therapy (Signed)
 OUTPATIENT SPEECH LANGUAGE PATHOLOGY EVALUATION   Patient Name: Brittany Mullins MRN: [id number] DOB:[birth year], 43 y.o., female Today's Date: 08/14/2023  PCP: Brittany Bridges, MD REFERRING PROVIDER: Genetta Kenning, MD  END OF SESSION:  End of Session - 08/14/23 1456     Visit Number 2    Number of Visits 17    Date for SLP Re-Evaluation 10/11/23   first ST 08/14/23   SLP Start Time 1451    SLP Stop Time  1531    SLP Time Calculation (min) 40 min    Activity Tolerance Patient tolerated treatment well             Past Medical History:  Diagnosis Date   Addison's disease (HCC)    Brain tumor (benign) (HCC)    Chronic pancreatitis (HCC)    Hearing loss    Hypertension    Marijuana use    Per pt: "medical marijuana patient"   Pancreatitis    PCOS (polycystic ovarian syndrome)    Pituitary tumor    Seizures (HCC)    Past Surgical History:  Procedure Laterality Date   ABDOMINAL HYSTERECTOMY     APPENDECTOMY     BIOPSY  08/16/2021   Procedure: BIOPSY;  Surgeon: Brittany Kinder, DO;  Location: [facility] ENDOSCOPY;  Service: Gastroenterology;;   CESAREAN SECTION     CHOLECYSTECTOMY     ELBOW SURGERY     ESOPHAGOGASTRODUODENOSCOPY (EGD) WITH PROPOFOL  N/A 08/16/2021   Procedure: ESOPHAGOGASTRODUODENOSCOPY (EGD) WITH PROPOFOL ;  Surgeon: Brittany Kinder, DO;  Location: [facility] ENDOSCOPY;  Service: Gastroenterology;  Laterality: N/A;   kidney stent     pancreatic stent     RADIOLOGY WITH ANESTHESIA N/A 04/25/2023   Procedure: MRI of Brain without contrast;  Surgeon: Radiologist, Medication, MD;  Location: [facility] OR;  Service: Radiology;  Laterality: N/A;   RADIOLOGY WITH ANESTHESIA N/A 05/03/2023   Procedure: MRI WITH ANESTHESIA CERVICAL SPINE WITHOUT CONTRAST;  Surgeon: Radiologist, Medication, MD;  Location: [facility] OR;  Service: Radiology;  Laterality: N/A;   Patient Active Problem List   Diagnosis Date Noted   Superficial thrombophlebitis 06/14/2023   Low protein S  05/19/2023   Sepsis (HCC) 05/19/2023   Blurry vision, left eye 05/19/2023   Nephrolithiasis 05/19/2023   Dysphagia 05/19/2023   Seizure (HCC) 05/19/2023   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   HTN (hypertension) 04/23/2023   Stroke (cerebrum) (HCC) 04/22/2023   Dizziness 04/22/2023   Intractable vomiting with nausea 08/17/2022   Abdominal pain, epigastric    Gastritis and gastroduodenitis    AKI (acute kidney injury) (HCC) 08/15/2021   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   Intractable nausea and vomiting with acute abdominal pain 10/25/2019   History of Addison's disease    Intractable cyclical vomiting with nausea    Chronic pain disorder 06/03/2017   Opioid dependence (HCC) 06/03/2017   Hypokalemia/hypomangesia 06/03/2017   Dehydration 06/03/2017   Generalized abdominal pain    Marijuana use    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 "Now I can only do  one of those." "I feel like I'm in a fog and my memory is real bad."  Pt accompanied by: self  PERTINENT HISTORY: presented to ED 04/22/23 with right sided weakness, facial numbness and asymmetry. 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 PMHx Addison's disease, brain tumor (benign), Hypertension, Marijuana use, opiod dependence - methadone , Pancreatitis, PCOS (polycystic ovarian syndrome), Pituitary tumor, and Seizures.   PAIN:  Are you having pain? Yes: NPRS scale: 5/10 Pain location: rt shoulder Pain  description: stabbing/throbbing Aggravating factors: movement raising hand Relieving factors: heat  FALLS: Has patient fallen in last 6 months?  Yes, 3. One Friday 07/19/23 ue to seizure  LIVING ENVIRONMENT: Lives with: lives with an adult companion Lives in: House/apartment  PLOF:  Level of assistance: Independent with ADLs, Independent with IADLs Employment: On disability (Addison's Syndrome)  PATIENT GOALS: "I want my memory to be better."  OBJECTIVE:  Note: Objective measures were completed at Evaluation unless otherwise noted.  DIAGNOSTIC FINDINGS:  MR 04/22/23 IMPRESSION: Acute left pontine infarct.     ORAL MOTOR EXAMINATION: Overall status: Impaired: Labial: Right (Strength and Coordination) Lingual: Right (Strength and Coordination) Comments: mild strength deficit with mild-mod coordination deficit   PATIENT REPORTED OUTCOME MEASURES (PROM): Cognitive Function: to be provided in first 3 sessions                                                                                                                            TREATMENT DATE:   08/14/23: Needs PROM next session. Pt arrives today troubled about her drooling from her rt labial margin and would like some exercises to work on this. SLP introduced oral motor exercises (see "pt instructions"). Pt req'd usual mod A for proper procedure. Pt did not begin to track anomia as she forgot. SLP cued pt for what she could do to make this occur. - her suggestion to text Brittany Mullins and ask him. SLP suggested she do something herself and she set reminder to put paper on frig or make note in her phone for anomia. SLP suggested Brittany Mullins be a backup for her only when she forgets. Pt thanked SLP for session today.  07/25/23: SLP shared role of SLP in ST rehab for cognition and for aphasia. SLP ascertained pt was satisfied with her speech currently and would prefer to work on cognition and language. SLP shared general goals with pt and she was  amenable to these. SLP talked with pt about some compensations to attempt at home such as using her phone for reminders and alarms to assist in recall. Lastly, SLP told pt to track her incidents of anomia and to share these with SLP.  PATIENT EDUCATION: Education details: see "treatment date" above Person educated: Patient Education method: Explanation, Demonstration, Verbal cues, and Handouts Education comprehension: verbalized understanding, returned demonstration, verbal cues required, and needs further education   GOALS: Goals reviewed with patient? Yes  SHORT TERM GOALS: Target date: 09/13/23 (first ST 08/14/23)  Pt will  tell SLP three trained anomia strategies Baseline: Goal status: INITIAL  2.  Pt will use trained one memory compensation successfully between 3 sessions Baseline:  Goal status: INITIAL  3.  Pt will demo WFL alternating attention between two simple tasks in 3 sessions with mod I Baseline:  Goal status: INITIAL  4.  Pt will bring planner to at least 5 sessions and demo [facility] appointment tracking in 4/5 of those sessions Baseline:  Goal status: INITIAL  5.  Pt will complete oral motor HEP (for decr frequency of drooling) with mod I in 2 sessions Baseline:  Goal status: INITIAL  6.  Pt will complete CLQT or FAVRES Baseline:  Goal status: INITIAL   LONG TERM GOALS: Target date: 10/11/23  Pt will improve PROM compared to iniital administration Baseline:  Goal status: INITIAL  2.  Pt will use/report using trained anomia strategies successfully in/between 3 sessions Baseline:  Goal status: INITIAL  3.  Pt will use/report use of at least two trained memory compensations successfully in/between 3 sessions Baseline:  Goal status: INITIAL  4.  Pt will pay at least 3 bills successfully using compensations Baseline:  Goal status: INITIAL  5.  Pt will demo WFL alternating attention in two min-mod complex tasks in 3 sessions Baseline:  Goal status:  INITIAL  6.  Pt will use compensations for anomia successfully in 15 minutes mod complex conversation in 3 sessions. Baseline:  Goal status: INITIAL   ASSESSMENT:  CLINICAL IMPRESSION: Patient is a 43 y.o. F who was seen today for treatment of cognitive communication following pontine CVA in January 2025. Pt endorses some occasional aphasia (anomia) approx every other day. Pt stated today she would like to decr frequency of drooling so SLP introduced oral motor exercises to pt. See "treatment date" above for today's date for further details on today's session. At this time pt is satisfied with her speech which is mildly dysarthric but 100% intelligible.   OBJECTIVE IMPAIRMENTS: include attention, memory, aphasia, and dysarthria. These impairments are limiting patient from managing medications, managing appointments, managing finances, household responsibilities, ADLs/IADLs, and effectively communicating at home and in community. Factors affecting potential to achieve goals and functional outcome are ability to learn/carryover information and severity of impairments. Patient will benefit from skilled SLP services to address above impairments and improve overall function. Pt may require an assessment of aphasia or of word finding during this therapy course.  REHAB POTENTIAL: Good  PLAN:  SLP FREQUENCY: 2x/week  SLP DURATION: 8 weeks  PLANNED INTERVENTIONS: Language facilitation, Environmental controls, Cueing hierachy, Cognitive reorganization, Internal/external aids, Oral motor exercises, Functional tasks, Multimodal communication approach, SLP instruction and feedback, Compensatory strategies, Patient/family education, and [id number] Treatment of speech (30 or 45 min)     Demonica Farrey, CCC-SLP 08/14/2023, 2:56 PM

## 2023-08-14 NOTE — Patient Instructions (Addendum)
 LIP and TONGUE exercises  --- DO ALL EXERCISES TWICE A DAY, at least 5 DAYS A WEEK (For at least 8 weeks)  1. Lip press - Press your lips together firmly (like making a /m/ sound) and hold 5 seconds -repeat 10 times  2. LOUD and LONG Susannah Epp - make a kissing sound as loud as you can, as long as you can - repeat 10 times  3. Tongue sweep - Sweep your tongue in the pockets of your mouth - touch every tooth  - five revolutions each way  4. PUH!         PIE!     POE! - 10 times       MAKE THE SOUNDS STRONG!      TUH!     TIE!      TOE!!- 10 times      KUH!      [facility]!      KOH!- 10 times  5. Move a straw from side to side in your mouth - 10 back and forth movements  6. Say "OOOOO", and "EEEEE" 10 times (Kiss and smile)  7. Put air in your cheeks, and push on either side 10 times  *KEEP THE AIR IN and DON'T LET IT ESCAPE!!*

## 2023-08-16 ENCOUNTER — Other Ambulatory Visit: Payer: Self-pay

## 2023-08-16 ENCOUNTER — Encounter (HOSPITAL_COMMUNITY): Payer: Self-pay | Admitting: *Deleted

## 2023-08-16 ENCOUNTER — Emergency Department (HOSPITAL_COMMUNITY)
Admission: EM | Admit: 2023-08-16 | Discharge: 2023-08-16 | Discharge status: Against Medical Advice | Attending: Physician Assistant | Admitting: Physician Assistant

## 2023-08-16 DIAGNOSIS — Z5321 Procedure and treatment not carried out due to patient leaving prior to being seen by health care provider: Secondary | ICD-10-CM | POA: Diagnosis not present

## 2023-08-16 DIAGNOSIS — R112 Nausea with vomiting, unspecified: Secondary | ICD-10-CM | POA: Insufficient documentation

## 2023-08-16 DIAGNOSIS — R1084 Generalized abdominal pain: Secondary | ICD-10-CM | POA: Insufficient documentation

## 2023-08-16 LAB — URINALYSIS, ROUTINE W REFLEX MICROSCOPIC
Bilirubin Urine: NEGATIVE
Glucose, UA: NEGATIVE mg/dL
Hgb urine dipstick: NEGATIVE
Ketones, ur: NEGATIVE mg/dL
Leukocytes,Ua: NEGATIVE
Nitrite: NEGATIVE
Protein, ur: NEGATIVE mg/dL
Specific Gravity, Urine: 1.013 (ref 1.005–1.030)
pH: 5 (ref 5.0–8.0)

## 2023-08-16 LAB — CBC WITH DIFFERENTIAL/PLATELET
Abs Immature Granulocytes: 0.03 10*3/uL (ref 0.00–0.07)
Basophils Absolute: 0 10*3/uL (ref 0.0–0.1)
Basophils Relative: 1 %
Eosinophils Absolute: 0.1 10*3/uL (ref 0.0–0.5)
Eosinophils Relative: 1 %
HCT: 41.1 % (ref 36.0–46.0)
Hemoglobin: 13.5 g/dL (ref 12.0–15.0)
Immature Granulocytes: 0 %
Lymphocytes Relative: 26 %
Lymphs Abs: 2.1 10*3/uL (ref 0.7–4.0)
MCH: 30.3 pg (ref 26.0–34.0)
MCHC: 32.8 g/dL (ref 30.0–36.0)
MCV: 92.4 fL (ref 80.0–100.0)
Monocytes Absolute: 0.5 10*3/uL (ref 0.1–1.0)
Monocytes Relative: 6 %
Neutro Abs: 5.3 10*3/uL (ref 1.7–7.7)
Neutrophils Relative %: 66 %
Platelets: 281 10*3/uL (ref 150–400)
RBC: 4.45 MIL/uL (ref 3.87–5.11)
RDW: 11.9 % (ref 11.5–15.5)
WBC: 8 10*3/uL (ref 4.0–10.5)
nRBC: 0 % (ref 0.0–0.2)

## 2023-08-16 LAB — COMPREHENSIVE METABOLIC PANEL WITH GFR
ALT: 24 U/L (ref 0–44)
AST: 35 U/L (ref 15–41)
Albumin: 3.7 g/dL (ref 3.5–5.0)
Alkaline Phosphatase: 47 U/L (ref 38–126)
Anion gap: 10 (ref 5–15)
BUN: 13 mg/dL (ref 6–20)
CO2: 26 mmol/L (ref 22–32)
Calcium: 8.8 mg/dL — ABNORMAL LOW (ref 8.9–10.3)
Chloride: 101 mmol/L (ref 98–111)
Creatinine, Ser: 0.8 mg/dL (ref 0.44–1.00)
GFR, Estimated: >60 mL/min (ref >60)
Glucose, Bld: 100 mg/dL — ABNORMAL HIGH (ref 70–99)
Potassium: 3.9 mmol/L (ref 3.5–5.1)
Sodium: 137 mmol/L (ref 135–145)
Total Bilirubin: 0.5 mg/dL (ref 0.0–1.2)
Total Protein: 6.9 g/dL (ref 6.5–8.1)

## 2023-08-16 LAB — LIPASE, BLOOD: Lipase: 29 U/L (ref 11–51)

## 2023-08-16 MED ORDER — ONDANSETRON HCL 4 MG/2ML IJ SOLN: 4.0000 mg | Freq: Once | INTRAMUSCULAR | Status: DC

## 2023-08-16 MED ORDER — SODIUM CHLORIDE 0.9 % IV BOLUS: 1000.0000 mL | Freq: Once | INTRAVENOUS | Status: DC

## 2023-08-16 NOTE — ED Triage Notes (Signed)
 Nausea and vomiting for 2 days she has addison s disease  weakness sl rt abd pain  lmp none

## 2023-08-16 NOTE — ED Provider Triage Note (Signed)
 Emergency Medicine Provider Triage Evaluation Note  Meryn Styer , a 43 y.o. female  was evaluated in triage.  Pt complains of vomiting  Review of Systems  Positive: Abdominal discomfort and increased urination  Negative: fever  Physical Exam  BP (!) 150/100 (BP Location: Right Arm)   Pulse 78   Temp 98.5 F (36.9 C) (Oral)   Resp 15   Ht 5\' 3"  (1.6 m)   Wt 86.2 kg   SpO2 100%   BMI 33.66 kg/m  Gen:   Awake, no distress   Resp:  Normal effort  MSK:   Moves extremities without difficulty  Other:  Abdomen diffusely tender   Medical Decision Making  Medically screening exam initiated at 8:12 PM.  Appropriate orders placed.  Adrien Frydrych Demers was informed that the remainder of the evaluation will be completed by another provider, this initial triage assessment does not replace that evaluation, and the importance of remaining in the ED until their evaluation is complete.     Sandi Crosby, [place] 08/16/23 2013

## 2023-08-19 ENCOUNTER — Ambulatory Visit

## 2023-08-19 ENCOUNTER — Emergency Department (HOSPITAL_COMMUNITY)
Admission: EM | Admit: 2023-08-19 | Discharge: 2023-08-20 | Disposition: A | Discharge status: Home / Self Care | Attending: Emergency Medicine | Admitting: Emergency Medicine

## 2023-08-19 ENCOUNTER — Emergency Department (HOSPITAL_COMMUNITY)

## 2023-08-19 ENCOUNTER — Encounter (HOSPITAL_COMMUNITY): Payer: Self-pay | Admitting: Emergency Medicine

## 2023-08-19 ENCOUNTER — Other Ambulatory Visit: Payer: Self-pay

## 2023-08-19 DIAGNOSIS — R4701 Aphasia: Secondary | ICD-10-CM

## 2023-08-19 DIAGNOSIS — Z7982 Long term (current) use of aspirin: Secondary | ICD-10-CM | POA: Diagnosis not present

## 2023-08-19 DIAGNOSIS — R1011 Right upper quadrant pain: Secondary | ICD-10-CM | POA: Insufficient documentation

## 2023-08-19 DIAGNOSIS — R471 Dysarthria and anarthria: Secondary | ICD-10-CM

## 2023-08-19 DIAGNOSIS — R41841 Cognitive communication deficit: Secondary | ICD-10-CM | POA: Diagnosis not present

## 2023-08-19 DIAGNOSIS — R112 Nausea with vomiting, unspecified: Secondary | ICD-10-CM | POA: Diagnosis not present

## 2023-08-19 LAB — CBC WITH DIFFERENTIAL/PLATELET
Abs Immature Granulocytes: 0.02 10*3/uL (ref 0.00–0.07)
Basophils Absolute: 0 10*3/uL (ref 0.0–0.1)
Basophils Relative: 1 %
Eosinophils Absolute: 0.1 10*3/uL (ref 0.0–0.5)
Eosinophils Relative: 1 %
HCT: 41.9 % (ref 36.0–46.0)
Hemoglobin: 13.6 g/dL (ref 12.0–15.0)
Immature Granulocytes: 0 %
Lymphocytes Relative: 31 %
Lymphs Abs: 2.4 10*3/uL (ref 0.7–4.0)
MCH: 30.4 pg (ref 26.0–34.0)
MCHC: 32.5 g/dL (ref 30.0–36.0)
MCV: 93.7 fL (ref 80.0–100.0)
Monocytes Absolute: 0.6 10*3/uL (ref 0.1–1.0)
Monocytes Relative: 7 %
Neutro Abs: 4.5 10*3/uL (ref 1.7–7.7)
Neutrophils Relative %: 60 %
Platelets: 280 10*3/uL (ref 150–400)
RBC: 4.47 MIL/uL (ref 3.87–5.11)
RDW: 11.9 % (ref 11.5–15.5)
WBC: 7.6 10*3/uL (ref 4.0–10.5)
nRBC: 0 % (ref 0.0–0.2)

## 2023-08-19 LAB — COMPREHENSIVE METABOLIC PANEL WITH GFR
ALT: 19 U/L (ref 0–44)
AST: 29 U/L (ref 15–41)
Albumin: 3.5 g/dL (ref 3.5–5.0)
Alkaline Phosphatase: 46 U/L (ref 38–126)
Anion gap: 9 (ref 5–15)
BUN: 20 mg/dL (ref 6–20)
CO2: 29 mmol/L (ref 22–32)
Calcium: 9.2 mg/dL (ref 8.9–10.3)
Chloride: 102 mmol/L (ref 98–111)
Creatinine, Ser: 1.01 mg/dL — ABNORMAL HIGH (ref 0.44–1.00)
GFR, Estimated: >60 mL/min (ref >60)
Glucose, Bld: 100 mg/dL — ABNORMAL HIGH (ref 70–99)
Potassium: 3.9 mmol/L (ref 3.5–5.1)
Sodium: 140 mmol/L (ref 135–145)
Total Bilirubin: 0.5 mg/dL (ref 0.0–1.2)
Total Protein: 6.9 g/dL (ref 6.5–8.1)

## 2023-08-19 NOTE — Patient Instructions (Signed)

## 2023-08-19 NOTE — ED Triage Notes (Signed)
 Pt reports emesis X1 week, thirst and constant urination. She is not sure if it is related to her addison's disease.  Also reports 7/10 stabbing "upper right quad pain."

## 2023-08-19 NOTE — Therapy (Signed)
 OUTPATIENT SPEECH LANGUAGE PATHOLOGY EVALUATION   Patient Name: Brittany Mullins MRN: [id number] DOB:07/21/1980, 43 y.o., female Today's Date: 08/19/2023  PCP: Berneda Bridges, MD REFERRING PROVIDER: Genetta Kenning, MD  END OF SESSION:  End of Session - 08/19/23 1412     Visit Number 3    Number of Visits 17    Date for SLP Re-Evaluation 10/11/23   first ST 08/14/23   SLP Start Time 1405    SLP Stop Time  1445    SLP Time Calculation (min) 40 min    Activity Tolerance Patient tolerated treatment well             Past Medical History:  Diagnosis Date   Addison's disease (HCC)    Brain tumor (benign) (HCC)    Chronic pancreatitis (HCC)    Hearing loss    Hypertension    Marijuana use    Per pt: "medical marijuana patient"   Pancreatitis    PCOS (polycystic ovarian syndrome)    Pituitary tumor    Seizures (HCC)    Past Surgical History:  Procedure Laterality Date   ABDOMINAL HYSTERECTOMY     APPENDECTOMY     BIOPSY  08/16/2021   Procedure: BIOPSY;  Surgeon: Annis Kinder, DO;  Location: [facility];  Service: Gastroenterology;;   CESAREAN SECTION     CHOLECYSTECTOMY     ELBOW SURGERY     ESOPHAGOGASTRODUODENOSCOPY (EGD) WITH PROPOFOL  N/A 08/16/2021   Procedure: ESOPHAGOGASTRODUODENOSCOPY (EGD) WITH PROPOFOL ;  Surgeon: Annis Kinder, DO;  Location: [facility];  Service: Gastroenterology;  Laterality: N/A;   kidney stent     pancreatic stent     RADIOLOGY WITH ANESTHESIA N/A 04/25/2023   Procedure: MRI of Brain without contrast;  Surgeon: Radiologist, Medication, MD;  Location: [facility];  Service: Radiology;  Laterality: N/A;   RADIOLOGY WITH ANESTHESIA N/A 05/03/2023   Procedure: MRI WITH ANESTHESIA CERVICAL SPINE WITHOUT CONTRAST;  Surgeon: Radiologist, Medication, MD;  Location: [facility];  Service: Radiology;  Laterality: N/A;   Patient Active Problem List   Diagnosis Date Noted   Superficial thrombophlebitis 06/14/2023   Low protein S  05/19/2023   Sepsis (HCC) 05/19/2023   Blurry vision, left eye 05/19/2023   Nephrolithiasis 05/19/2023   Dysphagia 05/19/2023   Seizure (HCC) 05/19/2023   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   HTN (hypertension) 04/23/2023   Stroke (cerebrum) (HCC) 04/22/2023   Dizziness 04/22/2023   Intractable vomiting with nausea 08/17/2022   Abdominal pain, epigastric    Gastritis and gastroduodenitis    AKI (acute kidney injury) (HCC) 08/15/2021   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   Intractable nausea and vomiting with acute abdominal pain 10/25/2019   History of Addison's disease    Intractable cyclical vomiting with nausea    Chronic pain disorder 06/03/2017   Opioid dependence (HCC) 06/03/2017   Hypokalemia/hypomangesia 06/03/2017   Dehydration 06/03/2017   Generalized abdominal pain    Marijuana use    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 MRI brain  with acute left pontine infarct. PMHx Addison's disease, brain tumor (benign), Hypertension, Marijuana use, opiod dependence - methadone , Pancreatitis, PCOS (polycystic ovarian syndrome), Pituitary tumor, and Seizures.   PAIN:  Are you having pain? Yes: NPRS scale: 7/10 Pain location: rt shoulder Pain description: stabbing/throbbing Aggravating factors: movement raising hand Relieving factors: heat  FALLS: Has patient fallen in last 6 months?  Yes, 1 since last session, due to off  balance  LIVING ENVIRONMENT: Lives with: lives with an adult companion Lives in: House/apartment  PLOF:  Level of assistance: Independent with ADLs, Independent with IADLs Employment: On disability (Addison's Syndrome)  PATIENT GOALS: "I want my memory to be better."  OBJECTIVE:  Note: Objective measures were completed at Evaluation unless otherwise noted.  DIAGNOSTIC FINDINGS:  MR 04/22/23 IMPRESSION: Acute left pontine infarct.   PATIENT REPORTED OUTCOME MEASURES (PROM): Cognitive Function: provided 08/19/23, which pt filled out in lobby and returned. She scored herself 78/140, with lower scores indicating more of an affect of pt's deficits on QOL.Pt scored herself "1" meaning "very often" with having trouble remembering new information like simple instructions, difficulty doing more than one thing at a time, and instances of anomia. "2" ("often", or "a lot") was scored with difficulty managing time to do daily activities, trouble remembering a list of 4-5 items without writing it down, trouble remembering things I am supposed to do (taking meds or buying something that was necessary), and had more difficulty thinking clearly.                                                                                                                            TREATMENT DATE:   08/19/23: Pt's "S" statement explained; She said "Knife" said instead of "gun", and pt could not generate the word "oatmeal" or the word "parking garage". SLP educated pt about anomia compensations completed today. See "pt instructions." Pt had more questions about circumlocution which SLP answered to her satisfaction. "That was very helpful, because now I know other ways to get my point across." SLP and pt reviewed pt's oral motor HEP - she req'd min-mod A, occasionally. By session end she had HEP procedure correct.  08/14/23: Needs PROM next session. Pt arrives today troubled about her drooling from her rt labial margin and  would like some exercises to work on this. SLP introduced oral motor exercises (see "pt instructions"). Pt req'd usual mod A for proper procedure. Pt did not begin to track anomia as she forgot. SLP cued pt for what she could do to make this occur. - her suggestion to text [name] and ask him. SLP suggested she do something herself and she set reminder to put paper on frig or make note in her phone for anomia. SLP suggested [name] be a backup for her only when she forgets. Pt thanked SLP for session today.  07/25/23: SLP shared role of SLP in ST rehab for cognition and for aphasia. SLP ascertained pt was satisfied with her  speech currently and would prefer to work on cognition and language. SLP shared general goals with pt and she was amenable to these. SLP talked with pt about some compensations to attempt at home such as using her phone for reminders and alarms to assist in recall. Lastly, SLP told pt to track her incidents of anomia and to share these with SLP.  PATIENT EDUCATION: Education details: see "treatment date" above Person educated: Patient Education method: Explanation, Demonstration, Verbal cues, and Handouts Education comprehension: verbalized understanding, returned demonstration, verbal cues required, and needs further education   GOALS: Goals reviewed with patient? Yes  SHORT TERM GOALS: Target date: 09/13/23 (first ST 08/14/23)  Pt will tell SLP three trained anomia strategies Baseline: Goal status: INITIAL  2.  Pt will use trained one memory compensation successfully between 3 sessions Baseline:  Goal status: INITIAL  3.  Pt will demo WFL alternating attention between two simple tasks in 3 sessions with mod I Baseline:  Goal status: INITIAL  4.  Pt will bring planner to at least 5 sessions and demo [facility] appointment tracking in 4/5 of those sessions Baseline:  Goal status: INITIAL  5.  Pt will complete oral motor HEP (for decr frequency of drooling) with mod I in 2  sessions Baseline:  Goal status: INITIAL  6.  Pt will complete CLQT or FAVRES Baseline:  Goal status: INITIAL   LONG TERM GOALS: Target date: 10/11/23  Pt will improve PROM compared to iniital administration Baseline:  Goal status: INITIAL  2.  Pt will use/report using trained anomia strategies successfully in/between 3 sessions Baseline:  Goal status: INITIAL  3.  Pt will use/report use of at least two trained memory compensations successfully in/between 3 sessions Baseline:  Goal status: INITIAL  4.  Pt will pay at least 3 bills successfully using compensations Baseline:  Goal status: INITIAL  5.  Pt will demo WFL alternating attention in two min-mod complex tasks in 3 sessions Baseline:  Goal status: INITIAL  6.  Pt will use compensations for anomia successfully in 15 minutes mod complex conversation in 3 sessions. Baseline:  Goal status: INITIAL   ASSESSMENT:  CLINICAL IMPRESSION: Patient is a 43 y.o. F who was seen today for treatment of cognitive communication following pontine CVA in January 2025. See "treatment date" above for today's date for further details on today's session. Pt endorses some occasional aphasia (anomia) approx every other day. Pt stated today she would like to decr frequency of drooling so SLP introduced oral motor exercises to pt as well. At this time pt is satisfied with her speech which is mildly dysarthric but 100% intelligible.   OBJECTIVE IMPAIRMENTS: include attention, memory, aphasia, and dysarthria. These impairments are limiting patient from managing medications, managing appointments, managing finances, household responsibilities, ADLs/IADLs, and effectively communicating at home and in community. Factors affecting potential to achieve goals and functional outcome are ability to learn/carryover information and severity of impairments. Patient will benefit from skilled SLP services to address above impairments and improve overall  function. Pt may require an assessment of aphasia or of word finding during this therapy course.  REHAB POTENTIAL: Good  PLAN:  SLP FREQUENCY: 2x/week  SLP DURATION: 8 weeks  PLANNED INTERVENTIONS: Language facilitation, Environmental controls, Cueing hierachy, Cognitive reorganization, Internal/external aids, Oral motor exercises, Functional tasks, Multimodal communication approach, SLP instruction and feedback, Compensatory strategies, Patient/family education, and [address] Treatment of speech (30 or 45 min)     Aaren Atallah, CCC-SLP 08/19/2023, 2:12 PM

## 2023-08-19 NOTE — ED Provider Triage Note (Signed)
 Emergency Medicine Provider Triage Evaluation Note  Brittany Mullins , a 43 y.o. female  was evaluated in triage.  Pt complains of diffuse abdominal pain with dysuria and urgency and frequency for the past week.  Reports also been having nausea and vomiting but no hematemesis.  She was brought she is been taking Azo.  She thinks is maybe her Addison's.  Was seen here on the 16th however left prior to evaluation.  Had labs but no imaging. Nursnig note reports RUQ however patient reports it is her whole stomach and her whole abdomen is tender.   Review of Systems  Positive:  Negative:   Physical Exam  BP (!) 118/90 (BP Location: Right Arm)   Pulse 92   Temp 98.4 F (36.9 C)   Resp 17   SpO2 95%  Gen:   Awake, no distress   Resp:  Normal effort  MSK:   Moves extremities without difficulty  Other:  Diffuse abdominal tenderness to palpation.   Medical Decision Making  Medically screening exam initiated at 9:53 PM.  Appropriate orders placed.  Brittany Mullins was informed that the remainder of the evaluation will be completed by another provider, this initial triage assessment does not replace that evaluation, and the importance of remaining in the ED until their evaluation is complete.  Labs and imaging ordered.   Spence Dux, [place] 08/19/23 2207

## 2023-08-20 DIAGNOSIS — R1011 Right upper quadrant pain: Secondary | ICD-10-CM | POA: Diagnosis not present

## 2023-08-20 LAB — URINALYSIS, ROUTINE W REFLEX MICROSCOPIC
Bilirubin Urine: NEGATIVE
Glucose, UA: NEGATIVE mg/dL
Hgb urine dipstick: NEGATIVE
Ketones, ur: NEGATIVE mg/dL
Leukocytes,Ua: NEGATIVE
Nitrite: POSITIVE — AB
Protein, ur: NEGATIVE mg/dL
Specific Gravity, Urine: 1.025 (ref 1.005–1.030)
pH: 5 (ref 5.0–8.0)

## 2023-08-20 MED ORDER — ONDANSETRON 4 MG PO TBDP: ORAL_TABLET | ORAL | 0 refills | Status: DC

## 2023-08-20 MED ORDER — POLYETHYLENE GLYCOL 3350 17 G PO PACK: 17.0000 g | PACK | Freq: Every day | ORAL | 0 refills | Status: AC

## 2023-08-20 MED ORDER — HYOSCYAMINE SULFATE 0.125 MG SL SUBL: 0.1250 mg | SUBLINGUAL_TABLET | SUBLINGUAL | 0 refills | Status: AC | PRN

## 2023-08-20 NOTE — ED Provider Notes (Signed)
 [place] EMERGENCY DEPARTMENT AT [facility] Provider Note   CSN: [id number] Arrival date & time: 08/19/23  1839     History  Chief Complaint  Patient presents with   Abdominal Pain   Emesis    Brittany Mullins is a 43 y.o. female.  Patient presents to the emergency department for evaluation of of right upper abdominal pain with vomiting.  Symptoms ongoing for a week.       Home Medications Prior to Admission medications   Medication Sig Start Date End Date Taking? Authorizing Provider  hyoscyamine (LEVSIN/SL) 0.125 MG SL tablet Place 1 tablet (0.125 mg total) under the tongue every 4 (four) hours as needed. 08/20/23  Yes Kada Friesen, Marine Sia, MD  ondansetron  (ZOFRAN -ODT) 4 MG disintegrating tablet 4mg  ODT q4 hours prn nausea/vomit 08/20/23  Yes Kingjames Coury, Marine Sia, MD  polyethylene glycol (MIRALAX ) 17 g packet Take 17 g by mouth daily. 08/20/23  Yes Dalma Panchal, Marine Sia, MD  acetaminophen  (TYLENOL ) 325 MG tablet Take 2 tablets (650 mg total) by mouth every 6 (six) hours as needed for mild pain (pain score 1-3). 05/16/23   Love, Renay Carota, PA-C  amLODipine  (NORVASC ) 5 MG tablet Take 1 tablet (5 mg total) by mouth daily. 06/18/23     ARIPiprazole  (ABILIFY ) 5 MG tablet Take 1 tablet (5 mg total) by mouth daily. 07/31/23   Onetha Bile, MD  aspirin  EC 81 MG tablet Take 1 tablet (81 mg total) by mouth daily. Swallow whole. 05/16/23   Love, Renay Carota, PA-C  atorvastatin  (LIPITOR) 40 MG tablet Take 1 tablet (40 mg total) by mouth daily. 05/16/23   Love, Renay Carota, PA-C  baclofen (LIORESAL) 20 MG tablet Take 20 mg by mouth 2 (two) times daily as needed. 06/05/23   [provider]  capsicum (ZOSTRIX) 0.075 % topical cream Apply topically 2 (two) times daily. Apply to shoulders--avoid eyes and broken skin 05/16/23   Love, Renay Carota, PA-C  cyclobenzaprine  (FLEXERIL ) 5 MG tablet Take 1 tablet (5 mg total) by mouth 3 (three) times daily as needed for muscle  spasms. 05/16/23   Love, Renay Carota, PA-C  furosemide (LASIX) 20 MG tablet Take 20 mg by mouth daily. 07/05/23   [provider]  gabapentin  (NEURONTIN ) 300 MG capsule Take 1 capsule (300 mg total) by mouth 2 (two) times daily with breakfast and with lunch 05/16/23   Love, Pamela S, PA-C  gabapentin  (NEURONTIN ) 400 MG capsule Take 1 capsule (400 mg total) by mouth at bedtime. 05/16/23   Love, Renay Carota, PA-C  hydrocortisone  (CORTEF ) 5 MG tablet Take 4 tablets (20 mg) by mouth daily in the morning and 2 tablets (10 mg) in the evening over the next 3 days then go back to home dose of 2 tablets (10 mg) in the morning, and 1 tablet (5mg ) in the evening. 05/22/23   Elgergawy, Ardia Kraft, MD  hydrOXYzine  (ATARAX ) 10 MG tablet Take 1 tablet (10 mg total) by mouth 3 (three) times daily as needed for anxiety. 05/16/23   Love, Renay Carota, PA-C  lamoTRIgine  (LAMICTAL ) 25 MG tablet Take 1 tablet (25 mg total) by mouth 2 (two) times daily. 05/16/23   Love, Renay Carota, PA-C  levETIRAcetam  (KEPPRA ) 500 MG tablet Take 1 tablet (500 mg total) by mouth every 8 (eight) hours. 05/16/23   Love, Renay Carota, PA-C  losartan  (COZAAR ) 25 MG tablet Take 1 tablet (25 mg total) by mouth daily. 06/18/23     methadone  (DOLOPHINE ) 10 MG/ML solution Take  125 mg by mouth daily.    [provider]  naloxone  (NARCAN ) 0.4 MG/ML injection Inject 1 mL (0.4 mg total) into the vein as needed. Patient not taking: Reported on 07/30/2023 05/01/23   Ezenduka, Nkeiruka J, MD  pantoprazole  (PROTONIX ) 40 MG tablet Take 1 tablet (40 mg total) by mouth daily at 6 (six) AM. 05/22/23   Elgergawy, Ardia Kraft, MD  potassium chloride  (KLOR-CON ) 10 MEQ tablet Take 10 mEq by mouth daily. 07/05/23 01/01/24  [provider]  traZODone  (DESYREL ) 50 MG tablet Take 1 tablet (50 mg total) by mouth at bedtime as needed for sleep. 05/17/23   Love, Renay Carota, PA-C  VIENVA  0.1-20 MG-MCG tablet Take 1 tablet by mouth daily. 03/21/23   [provider]       Allergies    Bee venom, Iodinated contrast media, Other, Toradol [ketorolac tromethamine], and Benadryl  [diphenhydramine ]    Review of Systems   Review of Systems  Physical Exam Updated Vital Signs BP (!) 84/69   Pulse 83   Temp 98.5 F (36.9 C) (Oral)   Resp 18   SpO2 99%  Physical Exam Vitals and nursing note reviewed.  Constitutional:      General: She is not in acute distress.    Appearance: She is well-developed.  HENT:     Head: Normocephalic and atraumatic.     Mouth/Throat:     Mouth: Mucous membranes are moist.  Eyes:     General: Vision grossly intact. Gaze aligned appropriately.     Extraocular Movements: Extraocular movements intact.     Conjunctiva/sclera: Conjunctivae normal.  Cardiovascular:     Rate and Rhythm: Normal rate and regular rhythm.     Pulses: Normal pulses.     Heart sounds: Normal heart sounds, S1 normal and S2 normal. No murmur heard.    No friction rub. No gallop.  Pulmonary:     Effort: Pulmonary effort is normal. No respiratory distress.     Breath sounds: Normal breath sounds.  Abdominal:     General: Bowel sounds are normal.     Palpations: Abdomen is soft.     Tenderness: There is abdominal tenderness in the right upper quadrant. There is no guarding or rebound.     Hernia: No hernia is present.  Musculoskeletal:        General: No swelling.     Cervical back: Full passive range of motion without pain, normal range of motion and neck supple. No spinous process tenderness or muscular tenderness. Normal range of motion.     Right lower leg: No edema.     Left lower leg: No edema.  Skin:    General: Skin is warm and dry.     Capillary Refill: Capillary refill takes less than 2 seconds.     Findings: No ecchymosis, erythema, rash or wound.  Neurological:     General: No focal deficit present.     Mental Status: She is alert and oriented to person, place, and time.     GCS: GCS eye subscore is 4. GCS verbal subscore is 5. GCS  motor subscore is 6.     Cranial Nerves: Cranial nerves 2-12 are intact.     Sensory: Sensation is intact.     Motor: Motor function is intact.     Coordination: Coordination is intact.  Psychiatric:        Attention and Perception: Attention normal.        Mood and Affect: Mood normal.  Speech: Speech normal.        Behavior: Behavior normal.     ED Results / Procedures / Treatments   Labs (all labs ordered are listed, but only abnormal results are displayed) Labs Reviewed  COMPREHENSIVE METABOLIC PANEL WITH GFR - Abnormal; Notable for the following components:      Result Value   Glucose, Bld 100 (*)    Creatinine, Ser 1.01 (*)    All other components within normal limits  URINALYSIS, ROUTINE W REFLEX MICROSCOPIC - Abnormal; Notable for the following components:   Color, Urine AMBER (*)    Nitrite POSITIVE (*)    Bacteria, UA MANY (*)    All other components within normal limits  URINE CULTURE  CBC WITH DIFFERENTIAL/PLATELET    EKG None  Radiology CT ABDOMEN PELVIS WO CONTRAST Result Date: 08/19/2023 CLINICAL DATA:  Diffuse abdominal pain with dysuria EXAM: CT ABDOMEN AND PELVIS WITHOUT CONTRAST TECHNIQUE: Multidetector CT imaging of the abdomen and pelvis was performed following the standard protocol without IV contrast. RADIATION DOSE REDUCTION: This exam was performed according to the departmental dose-optimization program which includes automated exposure control, adjustment of the mA and/or kV according to patient size and/or use of iterative reconstruction technique. COMPARISON:  CT 05/19/2023 FINDINGS: Lower chest: Lung bases demonstrate no acute airspace disease. Hepatobiliary: Status post cholecystectomy.  No biliary dilatation. Pancreas: Unremarkable. No pancreatic ductal dilatation or surrounding inflammatory changes. Spleen: Splenic granuloma Adrenals/Urinary Tract: Adrenal glands are normal. Punctate intrarenal stones bilaterally. No hydronephrosis. The  bladder is unremarkable Stomach/Bowel: The stomach is nonenlarged. No dilated small bowel. No acute bowel wall thickening. Large stool throughout the colon Vascular/Lymphatic: No significant vascular findings are present. No enlarged abdominal or pelvic lymph nodes. Reproductive: Status post hysterectomy. No adnexal masses. Other: Negative for pelvic effusion or free air Musculoskeletal: No acute or suspicious osseous abnormality IMPRESSION: 1. No CT evidence for acute intra-abdominal or pelvic abnormality. 2. Punctate intrarenal stones bilaterally. No hydronephrosis. 3. Large stool throughout the colon. Electronically Signed   By: Esmeralda Hedge M.D.   On: 08/19/2023 23:27    Procedures Procedures    Medications Ordered in ED Medications - No data to display  ED Course/ Medical Decision Making/ A&P                                 Medical Decision Making  Differential Diagnosis considered includes, but not limited to: cholangitis; bowel obstruction; esophagitis; gastritis; peptic ulcer disease; pancreatitis; cardiac.  Patient presents to the emergency department for evaluation of right upper quadrant abdominal pain.  Patient has had prior cholecystectomy.  The remainder of her abdominal exam is benign.  Lab work unremarkable.  Patient underwent CT scan that shows moderate stool burden but no other pathology.  Patient's blood pressure was normal at arrival.  She did drop here while in the emergency department, however, this was in the setting of her soundly sleeping.  When she awoke and got up, her blood pressure went back to normal.  She is not, therefore, in adrenal crisis.  Will discharge her with symptomatic treatment, continue her normal drug regimen including her hydrocortisone  tablets.        Final Clinical Impression(s) / ED Diagnoses Final diagnoses:  Nausea and vomiting, unspecified vomiting type    Rx / DC Orders ED Discharge Orders          Ordered    ondansetron   (ZOFRAN -ODT) 4 MG disintegrating  tablet        08/20/23 0420    polyethylene glycol (MIRALAX ) 17 g packet  Daily        08/20/23 0420    hyoscyamine (LEVSIN/SL) 0.125 MG SL tablet  Every 4 hours PRN        08/20/23 0420              Ballard Bongo, MD 08/20/23 [phone]

## 2023-08-20 NOTE — ED Notes (Signed)
   08/20/23 0408  Vitals  BP (!) 84/69  MAP (mmHg) 76  Pulse Rate 83  MEWS COLOR  MEWS Score Color Green  Orthostatic Lying   BP- Lying (!) 84/69  Pulse- Lying 73  Orthostatic Sitting  BP- Sitting 103/67  Pulse- Sitting 70  Orthostatic Standing at 0 minutes  BP- Standing at 0 minutes 116/70  Pulse- Standing at 0 minutes 75  Oxygen Therapy  SpO2 99 %  MEWS Score  MEWS Temp 0  MEWS Systolic 1  MEWS Pulse 0  MEWS RR 0  MEWS LOC 0  MEWS Score 1

## 2023-08-21 ENCOUNTER — Ambulatory Visit

## 2023-08-21 DIAGNOSIS — R471 Dysarthria and anarthria: Secondary | ICD-10-CM

## 2023-08-21 DIAGNOSIS — R4701 Aphasia: Secondary | ICD-10-CM

## 2023-08-21 DIAGNOSIS — R41841 Cognitive communication deficit: Secondary | ICD-10-CM

## 2023-08-21 LAB — URINE CULTURE: Culture: NO GROWTH

## 2023-08-21 NOTE — Therapy (Signed)
 [facility] [address] [place], [place], [address] Phone: [phone]   Fax:  [phone]  Patient Details  Name: Brittany Mullins MRN: [id number] Date of Birth: 1981-02-27 Referring Provider:  Genetta Kenning, MD  Encounter Date: 08/21/2023   Speech Therapy (ST) Arrive-Cancel When pt learned that Medicaid has a max of 27 visits she indicated she would like to decrease to once/week ST. SLP agreed and pt left without being seen today. Pt confirmed she will resume therapy 09/02/23 with PT and OT evaluations the same day and ST frequency will continue x1/week after that visit. ST frequency to be changed on pt's next visit note to x1/week.   Pascuala Klutts, CCC-SLP 08/21/2023, 3:16 PM  [place] [place] [facility] [address], [address] [place], [place], [address] Phone: [phone]   Fax:  [phone]

## 2023-08-27 ENCOUNTER — Encounter: Payer: Self-pay | Admitting: Family Medicine

## 2023-09-02 ENCOUNTER — Other Ambulatory Visit: Payer: Self-pay

## 2023-09-02 ENCOUNTER — Ambulatory Visit

## 2023-09-02 ENCOUNTER — Ambulatory Visit: Attending: Physical Medicine & Rehabilitation | Admitting: Occupational Therapy

## 2023-09-02 DIAGNOSIS — R471 Dysarthria and anarthria: Secondary | ICD-10-CM | POA: Insufficient documentation

## 2023-09-02 DIAGNOSIS — R4701 Aphasia: Secondary | ICD-10-CM | POA: Insufficient documentation

## 2023-09-02 DIAGNOSIS — M25511 Pain in right shoulder: Secondary | ICD-10-CM | POA: Diagnosis present

## 2023-09-02 DIAGNOSIS — I69351 Hemiplegia and hemiparesis following cerebral infarction affecting right dominant side: Secondary | ICD-10-CM | POA: Insufficient documentation

## 2023-09-02 DIAGNOSIS — R278 Other lack of coordination: Secondary | ICD-10-CM | POA: Insufficient documentation

## 2023-09-02 DIAGNOSIS — R2689 Other abnormalities of gait and mobility: Secondary | ICD-10-CM | POA: Insufficient documentation

## 2023-09-02 DIAGNOSIS — R262 Difficulty in walking, not elsewhere classified: Secondary | ICD-10-CM | POA: Insufficient documentation

## 2023-09-02 DIAGNOSIS — R41842 Visuospatial deficit: Secondary | ICD-10-CM | POA: Diagnosis present

## 2023-09-02 DIAGNOSIS — R208 Other disturbances of skin sensation: Secondary | ICD-10-CM | POA: Diagnosis present

## 2023-09-02 DIAGNOSIS — R2681 Unsteadiness on feet: Secondary | ICD-10-CM

## 2023-09-02 DIAGNOSIS — R41841 Cognitive communication deficit: Secondary | ICD-10-CM | POA: Insufficient documentation

## 2023-09-02 DIAGNOSIS — M6281 Muscle weakness (generalized): Secondary | ICD-10-CM | POA: Diagnosis present

## 2023-09-02 DIAGNOSIS — I6302 Cerebral infarction due to thrombosis of basilar artery: Secondary | ICD-10-CM | POA: Diagnosis not present

## 2023-09-02 NOTE — Therapy (Signed)
 OUTPATIENT PHYSICAL THERAPY NEURO EVALUATION   Patient Name: Brittany Mullins MRN: [id number] DOB:December 14, 1980, 43 y.o., female Today's Date: 09/02/2023   PCP: Berneda Bridges, MD REFERRING PROVIDER: Genetta Kenning, MD  END OF SESSION:  PT End of Session - 09/02/23 1110     Visit Number 1    Number of Visits 12    Date for PT Re-Evaluation 11/11/23    Authorization Type Medicare/Medicaiad    PT Start Time 1145    PT Stop Time 1230    PT Time Calculation (min) 45 min             Past Medical History:  Diagnosis Date   Addison's disease (HCC)    Brain tumor (benign) (HCC)    Chronic pancreatitis (HCC)    Hearing loss    Hypertension    Marijuana use    Per pt: "medical marijuana patient"   Pancreatitis    PCOS (polycystic ovarian syndrome)    Pituitary tumor    Seizures (HCC)    Past Surgical History:  Procedure Laterality Date   ABDOMINAL HYSTERECTOMY     APPENDECTOMY     BIOPSY  08/16/2021   Procedure: BIOPSY;  Surgeon: Annis Kinder, DO;  Location: [facility];  Service: Gastroenterology;;   CESAREAN SECTION     CHOLECYSTECTOMY     ELBOW SURGERY     ESOPHAGOGASTRODUODENOSCOPY (EGD) WITH PROPOFOL  N/A 08/16/2021   Procedure: ESOPHAGOGASTRODUODENOSCOPY (EGD) WITH PROPOFOL ;  Surgeon: Annis Kinder, DO;  Location: [facility];  Service: Gastroenterology;  Laterality: N/A;   kidney stent     pancreatic stent     RADIOLOGY WITH ANESTHESIA N/A 04/25/2023   Procedure: MRI of Brain without contrast;  Surgeon: Radiologist, Medication, MD;  Location: [facility];  Service: Radiology;  Laterality: N/A;   RADIOLOGY WITH ANESTHESIA N/A 05/03/2023   Procedure: MRI WITH ANESTHESIA CERVICAL SPINE WITHOUT CONTRAST;  Surgeon: Radiologist, Medication, MD;  Location: [facility];  Service: Radiology;  Laterality: N/A;   Patient Active Problem List   Diagnosis Date Noted   Superficial thrombophlebitis 06/14/2023   Low protein S 05/19/2023   Sepsis (HCC) 05/19/2023    Blurry vision, left eye 05/19/2023   Nephrolithiasis 05/19/2023   Dysphagia 05/19/2023   Seizure (HCC) 05/19/2023   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   HTN (hypertension) 04/23/2023   Stroke (cerebrum) (HCC) 04/22/2023   Dizziness 04/22/2023   Intractable vomiting with nausea 08/17/2022   Abdominal pain, epigastric    Gastritis and gastroduodenitis    AKI (acute kidney injury) (HCC) 08/15/2021   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   Intractable nausea and vomiting with acute abdominal pain 10/25/2019   History of Addison's disease    Intractable cyclical vomiting with nausea    Chronic pain disorder 06/03/2017   Opioid dependence (HCC) 06/03/2017   Hypokalemia/hypomangesia 06/03/2017   Dehydration 06/03/2017   Generalized abdominal pain    Marijuana use    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  Since onset, Pt notes feeling of intermittent dizziness when standing and walking and experiencing LOB when walking without 4WW. Reports RLE weakness and was provided an AFO last week for right ankle but unable to put it on independently. Notes regarding dizziness that it is worse upon waking in the AM and improves as the day progresses only for it to recur the next AM. Ultimately would like to return to independent PLOF Pt  accompanied by: significant other  PERTINENT HISTORY: history of Addison's disease, hypertension, chronic pancreatitis, chronic pain on Methadone , chronic nausea, history of prolactinoma, bipolar disorder, left pontine stroke in 04/2023, protein S deficiency, and seizuresadmitted on 04/22/2023 for right-sided numbness and abnormal gait with left calf pain. MRI brain showed an acute paramedian left pontine infarct.  PAIN:  Are you having pain? Yes: NPRS scale: 7/10 Pain location: right upper trap-neck Pain description: ache/sore Aggravating factors: ever since the stroke Relieving factors: rest, received injection  PRECAUTIONS: Fall  RED FLAGS: None   WEIGHT BEARING RESTRICTIONS: No  FALLS: Has patient fallen in last 6 months? Yes. Number of falls 5 no injuries other than skin tear  LIVING ENVIRONMENT: Lives with: lives with an adult companion Lives in: House/apartment, no stairs   PLOF:  Level of assistance: Independent with ADLs, Independent with IADLs Employment: On disability (Addison's Syndrome)    PATIENT GOALS: walk without AD  OBJECTIVE:  Note: Objective measures were completed at Evaluation unless otherwise noted.  DIAGNOSTIC FINDINGS:   COGNITION: Overall cognitive status: Within functional limits for tasks assessed   SENSATION: Not tested  COORDINATION: Difficulty with rapid toe tap RLE Heel to shin impaired w/ RLE    MUSCLE TONE: NT   DTRs:  NT  POSTURE: rounded shoulders and forward head  LOWER EXTREMITY ROM:     Active  Right Eval Left Eval  Hip flexion    Hip extension    Hip abduction    Hip adduction    Hip internal rotation    Hip external rotation    Knee flexion    Knee extension    Ankle dorsiflexion 5 15  Ankle plantarflexion    Ankle inversion    Ankle eversion     (Blank rows = not tested)  LOWER EXTREMITY MMT:    MMT Right Eval Left Eval  Hip flexion    Hip extension    Hip abduction 3+ 5  Hip adduction    Hip  internal rotation    Hip external rotation    Knee flexion 4 5  Knee extension 3+ 5  Ankle dorsiflexion 3- 5  Ankle plantarflexion 2+ 3  Ankle inversion    Ankle eversion    (Blank rows = not tested)  BED MOBILITY:  Not tested--reports indep  TRANSFERS: Sit-stand/chair-chair independent Floor to stand: DNT    CURB:  Findings: SBA-CGA  STAIRS: Findings: Level of Assistance: CGA, Number of Stairs: 6, Height of Stairs: 4-6   , and Comments: BHR and step-to GAIT: Findings: Gait Characteristics: decreased stance time- Right, decreased stride length, and poor foot clearance- Right, Distance walked:  , and Comments: reports endurance for about 6-10 minutes of walking w/ 4WW Level surfaces w/out AD and CGA x 30 ft  FUNCTIONAL TESTS:  5 times sit to stand: 15 Timed up and go (TUG):  10 Meter Walk Test: 25.34 sec = 1.29 ft sec Dynamic Gait Index: 8/24  [facility] PT Assessment - 09/02/23 0001       Standardized  Balance Assessment   Standardized Balance Assessment Dynamic Gait Index      Dynamic Gait Index   Level Surface Mild Impairment    Change in Gait Speed Moderate Impairment    Gait with Horizontal Head Turns Moderate Impairment    Gait with Vertical Head Turns Moderate Impairment    Gait and Pivot Turn Moderate Impairment    Step Over Obstacle Severe Impairment    Step Around Obstacles Moderate Impairment    Steps Moderate Impairment    Total Score 8             Berg Balance Scale:  Item Test date: 09/02/23 Test date:  Test date:   Sitting to standing 4. able to stand without using hands and stabilize independently Insert OPRCBERGREEVAL SmartPhrase at re-test date Insert OPRCBERGREEVAL SmartPhrase at re-test date  2. Standing unsupported 4. able to stand safely for 2 minutes    3. Sitting with back unsupported, feet supported 4. able to sit safely and securely for 2 minutes    4. Standing to sitting 4. sits safely with minimal use of hands    5. Pivot transfer  4.  able to transfer safely with minor use of hands    6. Standing unsupported with eyes closed 3. able to stand 10 seconds with supervision    7. Standing unsupported with feet together 3. able to place feet together independently and stand 1 minute with supervision    8. Reaching forward with outstretched arms while standing 3. can reach forward 12 cm (5 inches)    9. Pick up object from the floor from standing 4. able to pick up slipper safely and easily    10. Turning to look behind over left and right shoulders while standing 3. looks behind one side only, other side shows less weight shift    11. Turn 360 degrees 3. able to turn 360 degrees safely, one side only, in 4 seconds or less    12. Place alternate foot on step or stool while standing unsupported 2. able to complete 4 steps without aid with supervision    13. Standing unsupported one foot in front 2. able to take small step independently and hold 30 seconds    14. Standing on one leg 3. able to lift leg independently and hold 5-10 seconds     Total Score 46/56 Total Score /56 Total Score /56                                                                                                                                   TREATMENT DATE: 09/02/23    PATIENT EDUCATION: Education details: assessment details Person educated: Patient Education method: Explanation Education comprehension: needs further education  HOME EXERCISE PROGRAM: Access Code: [email or web address] URL: [email or web address] Date: 09/02/2023 Prepared by: Tedd Favorite  Exercises - Standing Balance in Corner with Eyes Closed  - 1 x daily - 7 x weekly - 3  sets - 30 sec hold - Corner Balance Feet Together With Eyes Open  - 1 x daily - 7 x weekly - 3 sets - 30 sec hold - Corner Balance Feet Together: Eyes Open With Head Turns  - 1 x daily - 7 x weekly - 3 sets - 5 reps  GOALS: Goals reviewed with patient? Yes  SHORT TERM GOALS: Target date: 09/30/2023     Patient will be independent in HEP to improve functional outcomes Baseline: Goal status: INITIAL  2.  Demo improved standing balance and reduced risk for falls per score 52/56 Berg Balance Test for greater safety during ADL Baseline: 46/56 Goal status: INITIAL  3.  Ambulation level surfaces w/out AD and supervision x 100 ft for improved household safety/ambulation Baseline: CGA x 30 ft Goal status: INITIAL    LONG TERM GOALS: Target date: 11/11/2023    Demo improved balance and reduce risk for falls per score 17/24 Dynamic Gait Index Baseline: 8/24 Goal status: INITIAL  2.  Report/demo improved ambulation endurance per report of 20 min continuous walking to improve community outings/endurance Baseline: reports 5-10 min limit Goal status: INITIAL  3.  Demo modified independence with cane over various surfaces to improve efficiency for community ambulation Baseline: 4WW Goal status: INITIAL  4.  Pt to demo/report independence with household ambulation (no AD) x 100 ft Baseline: CGA x 30 ft Goal status: INITIAL    ASSESSMENT:  CLINICAL IMPRESSION: Patient is a 43 y.o. lady who was seen today for physical therapy evaluation and treatment for deficits/limitations from CVA.  Exhibits RLE weakness and impaired coordination/balance with high risk for falls per outcome measures.  Exhibits significant gait deviation and difficulty with walking requiring use of AD at this time which limits degree of participation/use of hands during ADL/mobility.  Pt would benefit from PT services to address deficits and limitations to improve functional activity tolerance and participation.  Initiated with adapted balance activities to perform with significant other's supervision to initiate.    OBJECTIVE IMPAIRMENTS: Abnormal gait, decreased activity tolerance, decreased balance, decreased coordination, decreased endurance, decreased knowledge of use of DME, difficulty walking, decreased ROM,  decreased strength, dizziness, impaired flexibility, improper body mechanics, and pain.   ACTIVITY LIMITATIONS: carrying, lifting, bending, squatting, stairs, reach over head, and locomotion level  PARTICIPATION LIMITATIONS: meal prep, cleaning, interpersonal relationship, shopping, and community activity  PERSONAL FACTORS: Age, Time since onset of injury/illness/exacerbation, and 1-2 comorbidities: PMH are also affecting patient's functional outcome.   REHAB POTENTIAL: Good  CLINICAL DECISION MAKING: Evolving/moderate complexity  EVALUATION COMPLEXITY: Moderate  PLAN:  PT FREQUENCY: 1-2x/week  PT DURATION: 10 weeks  PLANNED INTERVENTIONS: 97750- Physical Performance Testing, 97110-Therapeutic exercises, 97530- Therapeutic activity, [id number]- Neuromuscular re-education, 97535- Self Care, [id number]- Manual therapy, [id number]- Gait training, [id number]- Orthotic Initial, [id number]- Orthotic/Prosthetic subsequent, and [phone]- Aquatic Therapy  PLAN FOR NEXT SESSION: HEP review and progression for strength, trials with donning AFO if she brings, trials with cane?   1:01 PM, 09/02/23 M. Kelly Asyah Candler, PT, DPT Physical Therapist- [place] Office Number: [phone]

## 2023-09-02 NOTE — Therapy (Signed)
 OUTPATIENT OCCUPATIONAL THERAPY NEURO EVALUATION  Patient Name: Brittany Mullins MRN: [id number] DOB:1980-11-20, 43 y.o., female Today's Date: 09/02/2023  PCP: Berneda Bridges, MD REFERRING PROVIDER: Genetta Kenning, MD  END OF SESSION:  OT End of Session - 09/02/23 1248     Visit Number 1    Number of Visits 13    Date for OT Re-Evaluation 10/18/23    Authorization Type Medicare Part A&B / [place] Medicaid 2025    Authorization Time Period 27 visit limit OT/PT/ST             Past Medical History:  Diagnosis Date   Addison's disease (HCC)    Brain tumor (benign) (HCC)    Chronic pancreatitis (HCC)    Hearing loss    Hypertension    Marijuana use    Per pt: "medical marijuana patient"   Pancreatitis    PCOS (polycystic ovarian syndrome)    Pituitary tumor    Seizures (HCC)    Past Surgical History:  Procedure Laterality Date   ABDOMINAL HYSTERECTOMY     APPENDECTOMY     BIOPSY  08/16/2021   Procedure: BIOPSY;  Surgeon: Annis Kinder, DO;  Location: [facility];  Service: Gastroenterology;;   CESAREAN SECTION     CHOLECYSTECTOMY     ELBOW SURGERY     ESOPHAGOGASTRODUODENOSCOPY (EGD) WITH PROPOFOL  N/A 08/16/2021   Procedure: ESOPHAGOGASTRODUODENOSCOPY (EGD) WITH PROPOFOL ;  Surgeon: Annis Kinder, DO;  Location: [facility];  Service: Gastroenterology;  Laterality: N/A;   kidney stent     pancreatic stent     RADIOLOGY WITH ANESTHESIA N/A 04/25/2023   Procedure: MRI of Brain without contrast;  Surgeon: Radiologist, Medication, MD;  Location: [facility];  Service: Radiology;  Laterality: N/A;   RADIOLOGY WITH ANESTHESIA N/A 05/03/2023   Procedure: MRI WITH ANESTHESIA CERVICAL SPINE WITHOUT CONTRAST;  Surgeon: Radiologist, Medication, MD;  Location: [facility];  Service: Radiology;  Laterality: N/A;   Patient Active Problem List   Diagnosis Date Noted   Superficial thrombophlebitis 06/14/2023   Low protein S 05/19/2023   Sepsis (HCC) 05/19/2023   Blurry  vision, left eye 05/19/2023   Nephrolithiasis 05/19/2023   Dysphagia 05/19/2023   Seizure (HCC) 05/19/2023   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   HTN (hypertension) 04/23/2023   Stroke (cerebrum) (HCC) 04/22/2023   Dizziness 04/22/2023   Intractable vomiting with nausea 08/17/2022   Abdominal pain, epigastric    Gastritis and gastroduodenitis    AKI (acute kidney injury) (HCC) 08/15/2021   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   Intractable nausea and vomiting with acute abdominal pain 10/25/2019   History of Addison's disease    Intractable cyclical vomiting with nausea    Chronic pain disorder 06/03/2017   Opioid dependence (HCC) 06/03/2017   Hypokalemia/hypomangesia 06/03/2017   Dehydration 06/03/2017   Generalized abdominal pain    Marijuana use    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   Bipolar I disorder with mixed features (HCC) 07/07/2012    ONSET DATE: 04/22/23  REFERRING DIAG: W09.81 (ICD-10-CM) - Cerebrovascular accident (CVA) due to thrombosis of basilar artery  THERAPY DIAG:  Hemiplegia and hemiparesis following cerebral infarction affecting right dominant side (HCC)  Muscle weakness (generalized)  Other disturbances of skin sensation  Other lack of coordination  Right shoulder pain, unspecified chronicity  Visuospatial deficit  Rationale for Evaluation and Treatment: Rehabilitation  SUBJECTIVE:   SUBJECTIVE STATEMENT: Pt reports having a stroke Jan 20205 that affected her right side.  Pt reports that she is slowly gaining more  use of her right side.  Pt reports still having difficulty with wiping self after going to the bathroom. She has cut herself when attempting to cook as she finds that her hands are shaky, more so in the afternoons. Pt accompanied by: self (dropped off)  PERTINENT HISTORY: RH- female with Addison's disease, PCOS, pancreatitis with chronic N/V, seizures, PUD/duodenitis, chronic pain (abdominal due to pancreatitis)- on  methadone , bipolar d/o, marijuana use who was admitted to [facility] on 04/22/23 with reports of right sided numbness that started day before as well as reports of abnormal gait with complaints of left calf pain per family. UDS positive for THC. CTA head/neck was negative for LVO. MRI brain done revealing acute paramedian left pontine infarct and ultrasound BLE dopplers were negative for DVT. She was noted to have slurred speech with intermittent aphasia and recommended DAPT X 3 weeks for stroke felt to be due to small vessel disease.  PRECAUTIONS: Fall  WEIGHT BEARING RESTRICTIONS: No  PAIN:  Are you having pain? Yes: NPRS scale: 7-8/10 Pain location: R shoulder and up through neck Pain description: shooting Aggravating factors: trying to use it (styling hair) Relieving factors: massage  FALLS: Has patient fallen in last 6 months? Yes. Number of falls ~5, "balance is off"  LIVING ENVIRONMENT: Lives with: lives with their partner (fiance) Lives in: House/apartment Stairs: No Has following equipment at home: Environmental consultant - 4 wheeled, Shower bench, bed side commode, and AFO  PLOF: Independent, Independent with basic ADLs, and Vocation/Vocational requirements: on disability - previously working, cleaning at [employer]  PATIENT GOALS: get back to being independent  OBJECTIVE:  Note: Objective measures were completed at Evaluation unless otherwise noted.  HAND DOMINANCE: Ambidextrous, can use both but "prefers" R for cutting, writing, eating  ADLs: Transfers/ambulation related to ADLs: Rollator for mobility at all times Eating: "get choked easy" Grooming: difficulty with brushing/styling hair and applying makeup due to R shoulder pain and shakiness in R hand UB Dressing: occasional difficulty getting shirt over head LB Dressing: difficulty with threading pants over feet, needs assistance with AFO, occasional assistance to get R shoe on Toileting: difficulty with hygiene post  toileting Bathing: difficulty with washing bottom Tub Shower transfers: currently getting into bath tub for bathing, fiance will have to aid in lifting her out afterwards.  Reports having a tub bench but that "it doesn't fit in her shower". Equipment: Transfer tub bench, bed side commode, and Reacher  IADLs: Light housekeeping: have tried to do the dishes, but typically uses dishwasher.  Pt with difficulty emptying washer/dryer and dishwasher.   Meal Prep: can do simple sandwich/cereal and microwave meals, but not cooking due to concern of cutting or burning self Community mobility: not currently driving Medication management: family member assists with filling weekly pill box Financial management: fiance is managing at this time - reports memory impairment Handwriting: 100% legible; PPT #1(whales live in a [place]): 17.28 sec  MOBILITY STATUS: Needs Assist: utilizing Rollator for mobility  POSTURE COMMENTS:  rounded shoulders and forward head  ACTIVITY TOLERANCE: Activity tolerance: diminished, reports fatiguing quickly with attempts to grocery shop  FUNCTIONAL OUTCOME MEASURES: PSFS 3.3   UPPER EXTREMITY ROM:    Active ROM Right eval Left eval  Shoulder flexion 112   Shoulder abduction 72   Shoulder adduction    Shoulder extension    Shoulder internal rotation 50%, able to reach to top of hip   Shoulder external rotation 95%   Elbow flexion    Elbow  extension    Wrist flexion    Wrist extension    Wrist ulnar deviation    Wrist radial deviation    Wrist pronation 90%   Wrist supination 75%   (Blank rows = not tested)  UPPER EXTREMITY MMT:     MMT Right eval Left eval  Shoulder flexion    Shoulder abduction    Shoulder adduction    Shoulder extension    Shoulder internal rotation    Shoulder external rotation    Middle trapezius    Lower trapezius    Elbow flexion    Elbow extension    Wrist flexion    Wrist extension    Wrist ulnar deviation     Wrist radial deviation    Wrist pronation    Wrist supination    (Blank rows = not tested)  HAND FUNCTION: TBD  COORDINATION: Finger Nose Finger test: Good symmetry bilaterally, noted guarding in R shoulder with movement and slower by 50% on R 9 Hole Peg test: Right: 43.47 sec; Left: 25.72 sec - pt with decreased use of index finger on R hand when placing and removing pegs Box and Blocks:  Right 32 blocks, Left 48 blocks  SENSATION: Pt reports impaired sensation  COGNITION: Overall cognitive status: Within functional limits for tasks assessed; does report changes in memory  VISION: Subjective report: reports difficulty with peripheral vision, blurriness in periphery Baseline vision: Wears glasses for distance only  VISION ASSESSMENT: Not tested To be further assessed in functional context                                                                                                                             TREATMENT DATE:  09/02/23 Educated on role and purpose of OT as well as potential interventions and goals for therapy based on initial evaluation findings.  Engaged in discussion regarding typical routine for bathing and potential benefit from use of AE and/or DME to increase ease and independence with bathing and LB dressing.    PATIENT EDUCATION: Education details: Educated on role and purpose of OT as well as potential interventions and goals for therapy based on initial evaluation findings. Person educated: Patient Education method: Explanation Education comprehension: verbalized understanding and needs further education  HOME EXERCISE PROGRAM: TBD   GOALS: Goals reviewed with patient? Yes  SHORT TERM GOALS: Target date: 09/27/23  Pt will be independent in ROM and coordination HEP with use of visual handouts. Baseline: new to OP OT Goal status: INITIAL  2.  Pt will verbalize understanding of task modifications and/or potential DME/AE needs to increase  ease, safety, and independence w/ ADLs. Baseline: has reacher and tub bench but is not using either Goal status: INITIAL  3.  Pt will verbalize decreased pain in R shoulder to <5/10 to allow for increased functional use. Baseline: pain 7/10 on eval, reports difficulty with styling hair Goal status: INITIAL   LONG TERM GOALS: Target date: 10/18/23  Pt will verbalize and/or demonstrate PRN safe tub/shower transfers with use of DME PRN. Baseline: has tub bench but reports that it does not fit in her shower Goal status: INITIAL  2.  Pt will be Mod I with LB dressing, to include donning shoe and AFO, with use of AE PRN. Baseline: needs help donning underwear/pants over R leg, shoe and AFO Goal status: INITIAL  3.   Pt will demonstrate improved functional ROM of RUE to aid in ability to pull shirt over head and complete hygiene post toileting. Baseline: decreased R shoulder ROM, impaired sensation, and shoulder pain Goal status: INITIAL  4.  Pt will demonstrate improved UE functional use for ADLs as evidenced by increasing box/ blocks score by 5 blocks with RUE. Baseline: Right 32 blocks, Left 48 blocks Goal status: INITIAL  5.  Patient will report at least two-point increase in average PSFS score or at least three-point increase in a single activity score indicating functionally significant improvement given minimum detectable change. Baseline: 3.3 Goal status: INITIAL  ASSESSMENT:  CLINICAL IMPRESSION: Patient is a 43 y.o. female who was seen today for occupational therapy evaluation for impairments in functional use of RUE, coordination, and independence with ADLs s/p CVA January 2025. Pt currently lives with fiance in a single level apt with no steps to enter and is on disability s/p CVA. Pt will benefit from skilled occupational therapy services to address strength and coordination, ROM, pain management, altered sensation, balance, GM/FM control, cognition, safety awareness,  introduction of compensatory strategies/AE prn, visual-perception, and implementation of an HEP to improve participation and safety during ADLs and IADLs.    PERFORMANCE DEFICITS: in functional skills including ADLs, IADLs, coordination, sensation, ROM, strength, pain, Fine motor control, Gross motor control, balance, body mechanics, endurance, decreased knowledge of precautions, decreased knowledge of use of DME, vision, and UE functional use, cognitive skills including memory, and psychosocial skills including environmental adaptation, habits, and routines and behaviors.   IMPAIRMENTS: are limiting patient from ADLs and IADLs.   CO-MORBIDITIES: may have co-morbidities  that affects occupational performance. Patient will benefit from skilled OT to address above impairments and improve overall function.  MODIFICATION OR ASSISTANCE TO COMPLETE EVALUATION: Min-Moderate modification of tasks or assist with assess necessary to complete an evaluation.  OT OCCUPATIONAL PROFILE AND HISTORY: Detailed assessment: Review of records and additional review of physical, cognitive, psychosocial history related to current functional performance.  CLINICAL DECISION MAKING: Moderate - several treatment options, min-mod task modification necessary  REHAB POTENTIAL: Good  EVALUATION COMPLEXITY: Low    PLAN:  OT FREQUENCY: 2x/week  OT DURATION: 6 weeks  PLANNED INTERVENTIONS: 97168 OT Re-evaluation, 97535 self care/ADL training, 09811 therapeutic exercise, 97530 therapeutic activity, 97112 neuromuscular re-education, 97035 ultrasound, [id number] electrical stimulation (manual), 97760 Orthotic Initial, [id number] Orthotic/Prosthetic subsequent, passive range of motion, functional mobility training, compression bandaging, visual/perceptual remediation/compensation, psychosocial skills training, energy conservation, coping strategies training, patient/family education, and DME and/or AE instructions  RECOMMENDED OTHER  SERVICES: NA  CONSULTED AND AGREED WITH PLAN OF CARE: Patient  PLAN FOR NEXT SESSION: initiate shoulder ROM HEP, initiate coordination HEP, discuss modalities for pain management, educate on AE/DME for shower transfers and LB dressing   Deina Lipsey, OTR/L 09/02/2023, 12:49 PM  [facility] [address] [place], [place] [address] Phone # [phone] Fax # [phone]

## 2023-09-05 NOTE — Therapy (Incomplete)
 OUTPATIENT PHYSICAL THERAPY NEURO TREATMENT   Patient Name: Brittany Mullins MRN: [id number] DOB:1981-01-22, 43 y.o., female Today's Date: 09/05/2023   PCP: Berneda Bridges, MD REFERRING PROVIDER: Genetta Kenning, MD  END OF SESSION:    Past Medical History:  Diagnosis Date   Addison's disease [facility])    Brain tumor (benign) (HCC)    Chronic pancreatitis (HCC)    Hearing loss    Hypertension    Marijuana use    Per pt: "medical marijuana patient"   Pancreatitis    PCOS (polycystic ovarian syndrome)    Pituitary tumor    Seizures (HCC)    Past Surgical History:  Procedure Laterality Date   ABDOMINAL HYSTERECTOMY     APPENDECTOMY     BIOPSY  08/16/2021   Procedure: BIOPSY;  Surgeon: Annis Kinder, DO;  Location: [facility];  Service: Gastroenterology;;   CESAREAN SECTION     CHOLECYSTECTOMY     ELBOW SURGERY     ESOPHAGOGASTRODUODENOSCOPY (EGD) WITH PROPOFOL  N/A 08/16/2021   Procedure: ESOPHAGOGASTRODUODENOSCOPY (EGD) WITH PROPOFOL ;  Surgeon: Annis Kinder, DO;  Location: [facility];  Service: Gastroenterology;  Laterality: N/A;   kidney stent     pancreatic stent     RADIOLOGY WITH ANESTHESIA N/A 04/25/2023   Procedure: MRI of Brain without contrast;  Surgeon: Radiologist, Medication, MD;  Location: [facility];  Service: Radiology;  Laterality: N/A;   RADIOLOGY WITH ANESTHESIA N/A 05/03/2023   Procedure: MRI WITH ANESTHESIA CERVICAL SPINE WITHOUT CONTRAST;  Surgeon: Radiologist, Medication, MD;  Location: [facility];  Service: Radiology;  Laterality: N/A;   Patient Active Problem List   Diagnosis Date Noted   Superficial thrombophlebitis 06/14/2023   Low protein S 05/19/2023   Sepsis (HCC) 05/19/2023   Blurry vision, left eye 05/19/2023   Nephrolithiasis 05/19/2023   Dysphagia 05/19/2023   Seizure (HCC) 05/19/2023   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   HTN (hypertension) 04/23/2023   Stroke (cerebrum) (HCC) 04/22/2023   Dizziness 04/22/2023    Intractable vomiting with nausea 08/17/2022   Abdominal pain, epigastric    Gastritis and gastroduodenitis    AKI (acute kidney injury) (HCC) 08/15/2021   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   Intractable nausea and vomiting with acute abdominal pain 10/25/2019   History of Addison's disease    Intractable cyclical vomiting with nausea    Chronic pain disorder 06/03/2017   Opioid dependence (HCC) 06/03/2017   Hypokalemia/hypomangesia 06/03/2017   Dehydration 06/03/2017   Generalized abdominal pain    Marijuana use    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  Rationale for Evaluation and Treatment: Rehabilitation  SUBJECTIVE:  SUBJECTIVE STATEMENT: CVA on 04/22/23.  Since onset, Pt notes feeling of intermittent dizziness when standing and walking and experiencing LOB when walking without 4WW. Reports RLE weakness and was provided an AFO last week for right ankle but unable to put it on independently. Notes regarding dizziness that it is worse upon waking in the AM and improves as the day progresses only for it to recur the next AM. Ultimately would like to return to independent PLOF Pt accompanied by: significant other  PERTINENT HISTORY: history of Addison's disease, hypertension, chronic pancreatitis, chronic pain on Methadone , chronic nausea, history of prolactinoma, bipolar disorder, left pontine stroke in 04/2023, protein S deficiency, and seizuresadmitted on 04/22/2023 for right-sided numbness and abnormal gait with left calf pain. MRI brain showed an acute  paramedian left pontine infarct.  PAIN:  Are you having pain? Yes: NPRS scale: 7/10 Pain location: right upper trap-neck Pain description: ache/sore Aggravating factors: ever since the stroke Relieving factors: rest, received injection  PRECAUTIONS: Fall  RED FLAGS: None   WEIGHT BEARING RESTRICTIONS: No  FALLS: Has patient fallen in last 6 months? Yes. Number of falls 5 no injuries other than skin tear  LIVING ENVIRONMENT: Lives with: lives with an adult companion Lives in: House/apartment, no stairs   PLOF:  Level of assistance: Independent with ADLs, Independent with IADLs Employment: On disability (Addison's Syndrome)    PATIENT GOALS: walk without AD  OBJECTIVE:      TODAY'S TREATMENT: 09/09/23 Activity Comments                           Note: Objective measures were completed at Evaluation unless otherwise noted.  DIAGNOSTIC FINDINGS:   COGNITION: Overall cognitive status: Within functional limits for tasks assessed   SENSATION: Not tested  COORDINATION: Difficulty with rapid toe tap RLE Heel to shin impaired w/ RLE    MUSCLE TONE: NT   DTRs:  NT  POSTURE: rounded shoulders and forward head  LOWER EXTREMITY ROM:     Active  Right Eval Left Eval  Hip flexion    Hip extension    Hip abduction    Hip adduction    Hip internal rotation    Hip external rotation    Knee flexion    Knee extension    Ankle dorsiflexion 5 15  Ankle plantarflexion    Ankle inversion    Ankle eversion     (Blank rows = not tested)  LOWER EXTREMITY MMT:    MMT Right Eval Left Eval  Hip flexion    Hip extension    Hip abduction 3+ 5  Hip adduction    Hip internal rotation    Hip external rotation    Knee flexion 4 5  Knee extension 3+ 5  Ankle dorsiflexion 3- 5  Ankle plantarflexion 2+ 3  Ankle inversion    Ankle eversion    (Blank rows = not tested)  BED MOBILITY:  Not tested--reports indep  TRANSFERS: Sit-stand/chair-chair  independent Floor to stand: DNT    CURB:  Findings: SBA-CGA  STAIRS: Findings: Level of Assistance: CGA, Number of Stairs: 6, Height of Stairs: 4-6   , and Comments: BHR and step-to GAIT: Findings: Gait Characteristics: decreased stance time- Right, decreased stride length, and poor foot clearance- Right, Distance walked:  , and Comments: reports endurance for about 6-10 minutes of walking w/ 4WW Level surfaces w/out AD and CGA x 30 ft  FUNCTIONAL TESTS:  5 times sit to stand:  15 Timed up and go (TUG):  10 Meter Walk Test: 25.34 sec = 1.29 ft sec Dynamic Gait Index: 8/24    Berg Balance Scale:  Item Test date: 09/02/23 Test date:  Test date:   Sitting to standing 4. able to stand without using hands and stabilize independently Insert OPRCBERGREEVAL SmartPhrase at re-test date Insert OPRCBERGREEVAL SmartPhrase at re-test date  2. Standing unsupported 4. able to stand safely for 2 minutes    3. Sitting with back unsupported, feet supported 4. able to sit safely and securely for 2 minutes    4. Standing to sitting 4. sits safely with minimal use of hands    5. Pivot transfer  4. able to transfer safely with minor use of hands    6. Standing unsupported with eyes closed 3. able to stand 10 seconds with supervision    7. Standing unsupported with feet together 3. able to place feet together independently and stand 1 minute with supervision    8. Reaching forward with outstretched arms while standing 3. can reach forward 12 cm (5 inches)    9. Pick up object from the floor from standing 4. able to pick up slipper safely and easily    10. Turning to look behind over left and right shoulders while standing 3. looks behind one side only, other side shows less weight shift    11. Turn 360 degrees 3. able to turn 360 degrees safely, one side only, in 4 seconds or less    12. Place alternate foot on step or stool while standing unsupported 2. able to complete 4 steps without aid with supervision     13. Standing unsupported one foot in front 2. able to take small step independently and hold 30 seconds    14. Standing on one leg 3. able to lift leg independently and hold 5-10 seconds     Total Score 46/56 Total Score /56 Total Score /56                                                                                                                                   TREATMENT DATE: 09/02/23    PATIENT EDUCATION: Education details: assessment details Person educated: Patient Education method: Explanation Education comprehension: needs further education  HOME EXERCISE PROGRAM: Access Code: [email or web address] URL: [email or web address] Date: 09/02/2023 Prepared by: Tedd Favorite  Exercises - Standing Balance in Corner with Eyes Closed  - 1 x daily - 7 x weekly - 3 sets - 30 sec hold - Corner Balance Feet Together With Eyes Open  - 1 x daily - 7 x weekly - 3 sets - 30 sec hold - Corner Balance Feet Together: Eyes Open With Head Turns  - 1 x daily - 7 x weekly - 3 sets - 5 reps  GOALS: Goals reviewed with patient? Yes  SHORT TERM GOALS: Target date: 09/30/2023    Patient will be independent in HEP to  improve functional outcomes Baseline: Goal status: INITIAL  2.  Demo improved standing balance and reduced risk for falls per score 52/56 Berg Balance Test for greater safety during ADL Baseline: 46/56 Goal status: INITIAL  3.  Ambulation level surfaces w/out AD and supervision x 100 ft for improved household safety/ambulation Baseline: CGA x 30 ft Goal status: INITIAL    LONG TERM GOALS: Target date: 11/11/2023    Demo improved balance and reduce risk for falls per score 17/24 Dynamic Gait Index Baseline: 8/24 Goal status: INITIAL  2.  Report/demo improved ambulation endurance per report of 20 min continuous walking to improve community outings/endurance Baseline: reports 5-10 min limit Goal status: INITIAL  3.  Demo modified independence with cane over various  surfaces to improve efficiency for community ambulation Baseline: 4WW Goal status: INITIAL  4.  Pt to demo/report independence with household ambulation (no AD) x 100 ft Baseline: CGA x 30 ft Goal status: INITIAL    ASSESSMENT:  CLINICAL IMPRESSION: Patient is a 43 y.o. lady who was seen today for physical therapy evaluation and treatment for deficits/limitations from CVA.  Exhibits RLE weakness and impaired coordination/balance with high risk for falls per outcome measures.  Exhibits significant gait deviation and difficulty with walking requiring use of AD at this time which limits degree of participation/use of hands during ADL/mobility.  Pt would benefit from PT services to address deficits and limitations to improve functional activity tolerance and participation.  Initiated with adapted balance activities to perform with significant other's supervision to initiate.    OBJECTIVE IMPAIRMENTS: Abnormal gait, decreased activity tolerance, decreased balance, decreased coordination, decreased endurance, decreased knowledge of use of DME, difficulty walking, decreased ROM, decreased strength, dizziness, impaired flexibility, improper body mechanics, and pain.   ACTIVITY LIMITATIONS: carrying, lifting, bending, squatting, stairs, reach over head, and locomotion level  PARTICIPATION LIMITATIONS: meal prep, cleaning, interpersonal relationship, shopping, and community activity  PERSONAL FACTORS: Age, Time since onset of injury/illness/exacerbation, and 1-2 comorbidities: PMH are also affecting patient's functional outcome.   REHAB POTENTIAL: Good  CLINICAL DECISION MAKING: Evolving/moderate complexity  EVALUATION COMPLEXITY: Moderate  PLAN:  PT FREQUENCY: 1-2x/week  PT DURATION: 10 weeks  PLANNED INTERVENTIONS: 97750- Physical Performance Testing, 97110-Therapeutic exercises, 97530- Therapeutic activity, [id number]- Neuromuscular re-education, 97535- Self Care, 16109- Manual therapy, [id number]-  Gait training, [id number]- Orthotic Initial, [id number]- Orthotic/Prosthetic subsequent, and [phone]- Aquatic Therapy  PLAN FOR NEXT SESSION: HEP review and progression for strength, trials with donning AFO if she brings, trials with cane?   10:04 AM, 09/05/23 M. Kelly Halpin, PT, DPT Physical Therapist- [place] Office Number: [phone]

## 2023-09-09 ENCOUNTER — Ambulatory Visit: Admitting: Occupational Therapy

## 2023-09-09 ENCOUNTER — Ambulatory Visit: Admitting: Physical Therapy

## 2023-09-09 ENCOUNTER — Ambulatory Visit

## 2023-09-10 NOTE — Therapy (Incomplete)
 OUTPATIENT PHYSICAL THERAPY NEURO TREATMENT   Patient Name: Brittany Mullins MRN: [id number] DOB:Jan 27, 1981, 43 y.o., female Today's Date: 09/10/2023   PCP: Berneda Bridges, MD REFERRING PROVIDER: Genetta Kenning, MD  END OF SESSION:    Past Medical History:  Diagnosis Date   Addison's disease [facility])    Brain tumor (benign) (HCC)    Chronic pancreatitis (HCC)    Hearing loss    Hypertension    Marijuana use    Per pt: "medical marijuana patient"   Pancreatitis    PCOS (polycystic ovarian syndrome)    Pituitary tumor    Seizures (HCC)    Past Surgical History:  Procedure Laterality Date   ABDOMINAL HYSTERECTOMY     APPENDECTOMY     BIOPSY  08/16/2021   Procedure: BIOPSY;  Surgeon: Annis Kinder, DO;  [facility]: [facility];  Service: Gastroenterology;;   CESAREAN SECTION     CHOLECYSTECTOMY     ELBOW SURGERY     ESOPHAGOGASTRODUODENOSCOPY (EGD) WITH PROPOFOL  N/A 08/16/2021   Procedure: ESOPHAGOGASTRODUODENOSCOPY (EGD) WITH PROPOFOL ;  Surgeon: Annis Kinder, DO;  Location: [facility];  Service: Gastroenterology;  Laterality: N/A;   kidney stent     pancreatic stent     RADIOLOGY WITH ANESTHESIA N/A 04/25/2023   Procedure: MRI of Brain without contrast;  Surgeon: Radiologist, Medication, MD;  Location: [facility];  Service: Radiology;  Laterality: N/A;   RADIOLOGY WITH ANESTHESIA N/A 05/03/2023   Procedure: MRI WITH ANESTHESIA CERVICAL SPINE WITHOUT CONTRAST;  Surgeon: Radiologist, Medication, MD;  Location: [facility] OR;  Service: Radiology;  Laterality: N/A;   Patient Active Problem List   Diagnosis Date Noted   Superficial thrombophlebitis 06/14/2023   Low protein S 05/19/2023   Sepsis (HCC) 05/19/2023   Blurry vision, left eye 05/19/2023   Nephrolithiasis 05/19/2023   Dysphagia 05/19/2023   Seizure (HCC) 05/19/2023   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   HTN (hypertension) 04/23/2023   Stroke (cerebrum) (HCC) 04/22/2023   Dizziness 04/22/2023    Intractable vomiting with nausea 08/17/2022   Abdominal pain, epigastric    Gastritis and gastroduodenitis    AKI (acute kidney injury) (HCC) 08/15/2021   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   Intractable nausea and vomiting with acute abdominal pain 10/25/2019   History of Addison's disease    Intractable cyclical vomiting with nausea    Chronic pain disorder 06/03/2017   Opioid dependence (HCC) 06/03/2017   Hypokalemia/hypomangesia 06/03/2017   Dehydration 06/03/2017   Generalized abdominal pain    Marijuana use    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   Bipolar I disorder with mixed features (HCC) 07/07/2012    ONSET DATE: 04/22/23  REFERRING DIAG:  N82.95 (ICD-10-CM) - Cerebrovascular accident (CVA) due to thrombosis of basilar artery (HCC)    THERAPY DIAG:  No diagnosis found.  Rationale for Evaluation and Treatment: Rehabilitation  SUBJECTIVE:  SUBJECTIVE STATEMENT: CVA on 04/22/23.  Since onset, Pt notes feeling of intermittent dizziness when standing and walking and experiencing LOB when walking without 4WW. Reports RLE weakness and was provided an AFO last week for right ankle but unable to put it on independently. Notes regarding dizziness that it is worse upon waking in the AM and improves as the day progresses only for it to recur the next AM. Ultimately would like to return to independent PLOF Pt accompanied by: significant other  PERTINENT HISTORY: history of Addison's disease, hypertension, chronic pancreatitis, chronic pain on Methadone , chronic nausea, history of prolactinoma, bipolar disorder, left pontine stroke in 04/2023, protein S deficiency, and seizuresadmitted on 04/22/2023 for right-sided numbness and abnormal gait with left calf pain. MRI brain showed an acute  paramedian left pontine infarct.  PAIN:  Are you having pain? Yes: NPRS scale: 7/10 Pain location: right upper trap-neck Pain description: ache/sore Aggravating factors: ever since the stroke Relieving factors: rest, received injection  PRECAUTIONS: Fall  RED FLAGS: None   WEIGHT BEARING RESTRICTIONS: No  FALLS: Has patient fallen in last 6 months? Yes. Number of falls 5 no injuries other than skin tear  LIVING ENVIRONMENT: Lives with: lives with an adult companion Lives in: House/apartment, no stairs   PLOF:  Level of assistance: Independent with ADLs, Independent with IADLs Employment: On disability (Addison's Syndrome)    PATIENT GOALS: walk without AD  OBJECTIVE:      TODAY'S TREATMENT: 09/11/23 Activity Comments                           Note: Objective measures were completed at Evaluation unless otherwise noted.  DIAGNOSTIC FINDINGS:   COGNITION: Overall cognitive status: Within functional limits for tasks assessed   SENSATION: Not tested  COORDINATION: Difficulty with rapid toe tap RLE Heel to shin impaired w/ RLE    MUSCLE TONE: NT   DTRs:  NT  POSTURE: rounded shoulders and forward head  LOWER EXTREMITY ROM:     Active  Right Eval Left Eval  Hip flexion    Hip extension    Hip abduction    Hip adduction    Hip internal rotation    Hip external rotation    Knee flexion    Knee extension    Ankle dorsiflexion 5 15  Ankle plantarflexion    Ankle inversion    Ankle eversion     (Blank rows = not tested)  LOWER EXTREMITY MMT:    MMT Right Eval Left Eval  Hip flexion    Hip extension    Hip abduction 3+ 5  Hip adduction    Hip internal rotation    Hip external rotation    Knee flexion 4 5  Knee extension 3+ 5  Ankle dorsiflexion 3- 5  Ankle plantarflexion 2+ 3  Ankle inversion    Ankle eversion    (Blank rows = not tested)  BED MOBILITY:  Not tested--reports indep  TRANSFERS: Sit-stand/chair-chair  independent Floor to stand: DNT    CURB:  Findings: SBA-CGA  STAIRS: Findings: Level of Assistance: CGA, Number of Stairs: 6, Height of Stairs: 4-6   , and Comments: BHR and step-to GAIT: Findings: Gait Characteristics: decreased stance time- Right, decreased stride length, and poor foot clearance- Right, Distance walked:  , and Comments: reports endurance for about 6-10 minutes of walking w/ 4WW Level surfaces w/out AD and CGA x 30 ft  FUNCTIONAL TESTS:  5 times sit to stand:  15 Timed up and go (TUG):  10 Meter Walk Test: 25.34 sec = 1.29 ft sec Dynamic Gait Index: 8/24    Berg Balance Scale:  Item Test date: 09/02/23 Test date:  Test date:   Sitting to standing 4. able to stand without using hands and stabilize independently Insert OPRCBERGREEVAL SmartPhrase at re-test date Insert OPRCBERGREEVAL SmartPhrase at re-test date  2. Standing unsupported 4. able to stand safely for 2 minutes    3. Sitting with back unsupported, feet supported 4. able to sit safely and securely for 2 minutes    4. Standing to sitting 4. sits safely with minimal use of hands    5. Pivot transfer  4. able to transfer safely with minor use of hands    6. Standing unsupported with eyes closed 3. able to stand 10 seconds with supervision    7. Standing unsupported with feet together 3. able to place feet together independently and stand 1 minute with supervision    8. Reaching forward with outstretched arms while standing 3. can reach forward 12 cm (5 inches)    9. Pick up object from the floor from standing 4. able to pick up slipper safely and easily    10. Turning to look behind over left and right shoulders while standing 3. looks behind one side only, other side shows less weight shift    11. Turn 360 degrees 3. able to turn 360 degrees safely, one side only, in 4 seconds or less    12. Place alternate foot on step or stool while standing unsupported 2. able to complete 4 steps without aid with supervision     13. Standing unsupported one foot in front 2. able to take small step independently and hold 30 seconds    14. Standing on one leg 3. able to lift leg independently and hold 5-10 seconds     Total Score 46/56 Total Score /56 Total Score /56                                                                                                                                   TREATMENT DATE: 09/02/23    PATIENT EDUCATION: Education details: assessment details Person educated: Patient Education method: Explanation Education comprehension: needs further education  HOME EXERCISE PROGRAM: Access Code: [email or web address] URL: [email or web address] Date: 09/02/2023 Prepared by: Tedd Favorite  Exercises - Standing Balance in Corner with Eyes Closed  - 1 x daily - 7 x weekly - 3 sets - 30 sec hold - Corner Balance Feet Together With Eyes Open  - 1 x daily - 7 x weekly - 3 sets - 30 sec hold - Corner Balance Feet Together: Eyes Open With Head Turns  - 1 x daily - 7 x weekly - 3 sets - 5 reps  GOALS: Goals reviewed with patient? Yes  SHORT TERM GOALS: Target date: 09/30/2023    Patient will be independent in HEP to  improve functional outcomes Baseline: Goal status: IN PROGRESS  2.  Demo improved standing balance and reduced risk for falls per score 52/56 Berg Balance Test for greater safety during ADL Baseline: 46/56 Goal status: IN PROGRESS  3.  Ambulation level surfaces w/out AD and supervision x 100 ft for improved household safety/ambulation Baseline: CGA x 30 ft Goal status: IN PROGRESS    LONG TERM GOALS: Target date: 11/11/2023    Demo improved balance and reduce risk for falls per score 17/24 Dynamic Gait Index Baseline: 8/24 Goal status: IN PROGRESS  2.  Report/demo improved ambulation endurance per report of 20 min continuous walking to improve community outings/endurance Baseline: reports 5-10 min limit Goal status: IN PROGRESS  3.  Demo modified independence  with cane over various surfaces to improve efficiency for community ambulation Baseline: 4WW Goal status: IN PROGRESS  4.  Pt to demo/report independence with household ambulation (no AD) x 100 ft Baseline: CGA x 30 ft Goal status: IN PROGRESS    ASSESSMENT:  CLINICAL IMPRESSION: Patient is a 43 y.o. lady who was seen today for physical therapy evaluation and treatment for deficits/limitations from CVA.  Exhibits RLE weakness and impaired coordination/balance with high risk for falls per outcome measures.  Exhibits significant gait deviation and difficulty with walking requiring use of AD at this time which limits degree of participation/use of hands during ADL/mobility.  Pt would benefit from PT services to address deficits and limitations to improve functional activity tolerance and participation.  Initiated with adapted balance activities to perform with significant other's supervision to initiate.    OBJECTIVE IMPAIRMENTS: Abnormal gait, decreased activity tolerance, decreased balance, decreased coordination, decreased endurance, decreased knowledge of use of DME, difficulty walking, decreased ROM, decreased strength, dizziness, impaired flexibility, improper body mechanics, and pain.   ACTIVITY LIMITATIONS: carrying, lifting, bending, squatting, stairs, reach over head, and locomotion level  PARTICIPATION LIMITATIONS: meal prep, cleaning, interpersonal relationship, shopping, and community activity  PERSONAL FACTORS: Age, Time since onset of injury/illness/exacerbation, and 1-2 comorbidities: PMH are also affecting patient's functional outcome.   REHAB POTENTIAL: Good  CLINICAL DECISION MAKING: Evolving/moderate complexity  EVALUATION COMPLEXITY: Moderate  PLAN:  PT FREQUENCY: 1-2x/week  PT DURATION: 10 weeks  PLANNED INTERVENTIONS: 97750- Physical Performance Testing, 97110-Therapeutic exercises, 97530- Therapeutic activity, [id number]- Neuromuscular re-education, 97535- Self  Care, 96045- Manual therapy, [id number]- Gait training, [id number]- Orthotic Initial, [id number]- Orthotic/Prosthetic subsequent, and [phone]- Aquatic Therapy  PLAN FOR NEXT SESSION: HEP review and progression for strength, trials with donning AFO if she brings, trials with cane?

## 2023-09-11 ENCOUNTER — Ambulatory Visit: Admitting: Physical Therapy

## 2023-09-11 ENCOUNTER — Ambulatory Visit: Admitting: Occupational Therapy

## 2023-09-11 ENCOUNTER — Ambulatory Visit

## 2023-09-13 NOTE — Therapy (Signed)
 OUTPATIENT PHYSICAL THERAPY NEURO TREATMENT   Patient Name: Brittany Mullins MRN: [id number] DOB:1980/06/28, 43 y.o., female Today's Date: 09/16/2023   PCP: Berneda Bridges, MD REFERRING PROVIDER: Genetta Kenning, MD  END OF SESSION:  PT End of Session - 09/16/23 1217     Visit Number 2    Number of Visits 12    Date for PT Re-Evaluation 11/11/23    Authorization Type Medicare/Medicaiad    PT Start Time 1149    PT Stop Time 1228    PT Time Calculation (min) 39 min    Equipment Utilized During Treatment Gait belt    Activity Tolerance Patient tolerated treatment well    Behavior During Therapy WFL for tasks assessed/performed           Past Medical History:  Diagnosis Date   Addison's disease (HCC)    Brain tumor (benign) (HCC)    Chronic pancreatitis (HCC)    Hearing loss    Hypertension    Marijuana use    Per pt: medical marijuana patient   Pancreatitis    PCOS (polycystic ovarian syndrome)    Pituitary tumor    Seizures (HCC)    Past Surgical History:  Procedure Laterality Date   ABDOMINAL HYSTERECTOMY     APPENDECTOMY     BIOPSY  08/16/2021   Procedure: BIOPSY;  Surgeon: Annis Kinder, DO;  Location: [facility];  Service: Gastroenterology;;   CESAREAN SECTION     CHOLECYSTECTOMY     ELBOW SURGERY     ESOPHAGOGASTRODUODENOSCOPY (EGD) WITH PROPOFOL  N/A 08/16/2021   Procedure: ESOPHAGOGASTRODUODENOSCOPY (EGD) WITH PROPOFOL ;  Surgeon: Annis Kinder, DO;  Location: [facility];  Service: Gastroenterology;  Laterality: N/A;   kidney stent     pancreatic stent     RADIOLOGY WITH ANESTHESIA N/A 04/25/2023   Procedure: MRI of Brain without contrast;  Surgeon: Radiologist, Medication, MD;  Location: [facility];  Service: Radiology;  Laterality: N/A;   RADIOLOGY WITH ANESTHESIA N/A 05/03/2023   Procedure: MRI WITH ANESTHESIA CERVICAL SPINE WITHOUT CONTRAST;  Surgeon: Radiologist, Medication, MD;  Location: [facility];  Service: Radiology;  Laterality:  N/A;   Patient Active Problem List   Diagnosis Date Noted   Superficial thrombophlebitis 06/14/2023   Low protein S 05/19/2023   Sepsis (HCC) 05/19/2023   Blurry vision, left eye 05/19/2023   Nephrolithiasis 05/19/2023   Dysphagia 05/19/2023   Seizure (HCC) 05/19/2023   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   HTN (hypertension) 04/23/2023   Stroke (cerebrum) (HCC) 04/22/2023   Dizziness 04/22/2023   Intractable vomiting with nausea 08/17/2022   Abdominal pain, epigastric    Gastritis and gastroduodenitis    AKI (acute kidney injury) (HCC) 08/15/2021   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   Intractable nausea and vomiting with acute abdominal pain 10/25/2019   History of Addison's disease    Intractable cyclical vomiting with nausea    Chronic pain disorder 06/03/2017   Opioid dependence (HCC) 06/03/2017   Hypokalemia/hypomangesia 06/03/2017   Dehydration 06/03/2017   Generalized abdominal pain    Marijuana use    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 Nothing else new.   Pt accompanied by: self  PERTINENT HISTORY: history of Addison's disease, hypertension, chronic pancreatitis, chronic pain on Methadone , chronic nausea, history of prolactinoma, bipolar disorder, left pontine stroke in 04/2023,  protein S deficiency, and seizuresadmitted on 04/22/2023 for right-sided numbness and abnormal gait with left calf pain. MRI brain showed an acute paramedian left pontine infarct.  PAIN:  Are you having pain? Yes: NPRS scale: 4-5/10 Pain location: R ankle Pain description: aching Aggravating factors: ever since the stroke Relieving factors: heat  PRECAUTIONS: Fall  RED FLAGS: None   WEIGHT BEARING RESTRICTIONS: No  FALLS: Has patient fallen in last 6 months? Yes. Number of falls 5 no injuries other than skin tear  LIVING ENVIRONMENT: Lives with: lives with an adult companion Lives in: House/apartment, no stairs   PLOF:  Level of assistance: Independent with ADLs, Independent with IADLs Employment: On disability (Addison's Syndrome)    PATIENT GOALS: walk without AD  OBJECTIVE:      TODAY'S TREATMENT: 09/16/23 Activity Comments  Gait training with R AFO and no AD x200 Cueing to elongate R step and relax R arm down by sides (R UE flexor tone kicks in with gait)  Gait training with SPC 50ft  Improved stability and continuity of stepping; good sequencing after cueing and demo  Review of HEP: Standing EC 30 Romberg 30  Romberg + head turns 30  Mild ant/pos sway; cues to maintain torso still with head turns   Standing lateral wt shift  Tendency for R knee to bend upon wt shift; used manual cues and mirror feedback   Standing  ant/pos wt shift Cues to keep feet down and encourage equal wt shift; mirror feedback  Standing green TB TKE 10x3 With mirror; still with tendency to shift L slightly upon extending R knee     HOME EXERCISE PROGRAM Last updated: 09/16/23 Access Code: [email or web address] URL: [email or web address] Date: 09/16/2023 Prepared by: [facility] - [facility]  Exercises - Standing Balance in Corner with Eyes Closed  - 1 x daily - 7 x weekly - 3 sets - 30 sec hold - Standing Terminal Knee Extension with Resistance  - 1 x daily  - 5 x weekly - 2 sets - 10 reps - 5 sec hold - Side to Side Weight Shift with Counter Support  - 1 x daily - 5 x weekly - 2 sets - 10 reps   PATIENT EDUCATION: Education details: encouraged wearing sock under AFO; encouraged obtaining SPC; HEP update and consolidation with edu for safety Person educated: Patient Education method: Explanation, Demonstration, Tactile cues, Verbal cues, and Handouts Education comprehension: verbalized understanding and returned demonstration     Note: Objective measures were completed at Evaluation unless otherwise noted.  DIAGNOSTIC FINDINGS:   COGNITION: Overall cognitive status: Within functional limits for tasks assessed   SENSATION: Not tested  COORDINATION: Difficulty with rapid toe tap RLE Heel to shin impaired w/ RLE    MUSCLE TONE: NT   DTRs:  NT  POSTURE: rounded shoulders and forward head  LOWER EXTREMITY ROM:     Active  Right Eval Left Eval  Hip flexion    Hip extension    Hip abduction    Hip adduction    Hip internal rotation    Hip external rotation    Knee flexion    Knee extension    Ankle dorsiflexion 5 15  Ankle plantarflexion    Ankle  inversion    Ankle eversion     (Blank rows = not tested)  LOWER EXTREMITY MMT:    MMT Right Eval Left Eval  Hip flexion    Hip extension    Hip abduction 3+ 5  Hip adduction    Hip internal rotation    Hip external rotation    Knee flexion 4 5  Knee extension 3+ 5  Ankle dorsiflexion 3- 5  Ankle plantarflexion 2+ 3  Ankle inversion    Ankle eversion    (Blank rows = not tested)  BED MOBILITY:  Not tested--reports indep  TRANSFERS: Sit-stand/chair-chair independent Floor to stand: DNT    CURB:  Findings: SBA-CGA  STAIRS: Findings: Level of Assistance: CGA, Number of Stairs: 6, Height of Stairs: 4-6   , and Comments: BHR and step-to GAIT: Findings: Gait Characteristics: decreased stance time- Right, decreased stride length, and poor foot  clearance- Right, Distance walked:  , and Comments: reports endurance for about 6-10 minutes of walking w/ 4WW Level surfaces w/out AD and CGA x 30 ft  FUNCTIONAL TESTS:  5 times sit to stand: 15 Timed up and go (TUG):  10 Meter Walk Test: 25.34 sec = 1.29 ft sec Dynamic Gait Index: 8/24    Berg Balance Scale:  Item Test date: 09/02/23 Test date:  Test date:   Sitting to standing 4. able to stand without using hands and stabilize independently Insert OPRCBERGREEVAL SmartPhrase at re-test date Insert OPRCBERGREEVAL SmartPhrase at re-test date  2. Standing unsupported 4. able to stand safely for 2 minutes    3. Sitting with back unsupported, feet supported 4. able to sit safely and securely for 2 minutes    4. Standing to sitting 4. sits safely with minimal use of hands    5. Pivot transfer  4. able to transfer safely with minor use of hands    6. Standing unsupported with eyes closed 3. able to stand 10 seconds with supervision    7. Standing unsupported with feet together 3. able to place feet together independently and stand 1 minute with supervision    8. Reaching forward with outstretched arms while standing 3. can reach forward 12 cm (5 inches)    9. Pick up object from the floor from standing 4. able to pick up slipper safely and easily    10. Turning to look behind over left and right shoulders while standing 3. looks behind one side only, other side shows less weight shift    11. Turn 360 degrees 3. able to turn 360 degrees safely, one side only, in 4 seconds or less    12. Place alternate foot on step or stool while standing unsupported 2. able to complete 4 steps without aid with supervision    13. Standing unsupported one foot in front 2. able to take small step independently and hold 30 seconds    14. Standing on one leg 3. able to lift leg independently and hold 5-10 seconds     Total Score 46/56 Total Score /56 Total Score /56  TREATMENT DATE: 09/02/23    PATIENT EDUCATION: Education details: assessment details Person educated: Patient Education method: Explanation Education comprehension: needs further education  HOME EXERCISE PROGRAM: Access Code: [email or web address] URL: [email or web address] Date: 09/02/2023 Prepared by: Tedd Favorite  Exercises - Standing Balance in Corner with Eyes Closed  - 1 x daily - 7 x weekly - 3 sets - 30 sec hold - Corner Balance Feet Together With Eyes Open  - 1 x daily - 7 x weekly - 3 sets - 30 sec hold - Corner Balance Feet Together: Eyes Open With Head Turns  - 1 x daily - 7 x weekly - 3 sets - 5 reps  GOALS: Goals reviewed with patient? Yes  SHORT TERM GOALS: Target date: 09/30/2023    Patient will be independent in HEP to improve functional outcomes Baseline: Goal status: IN PROGRESS  2.  Demo improved standing balance and reduced risk for falls per score 52/56 Berg Balance Test for greater safety during ADL Baseline: 46/56 Goal status: IN PROGRESS  3.  Ambulation level surfaces w/out AD and supervision x 100 ft for improved household safety/ambulation Baseline: CGA x 30 ft Goal status: IN PROGRESS    LONG TERM GOALS: Target date: 11/11/2023    Demo improved balance and reduce risk for falls per score 17/24 Dynamic Gait Index Baseline: 8/24 Goal status: IN PROGRESS  2.  Report/demo improved ambulation endurance per report of 20 min continuous walking to improve community outings/endurance Baseline: reports 5-10 min limit Goal status: IN PROGRESS  3.  Demo modified independence with cane over various surfaces to improve efficiency for community ambulation Baseline: 4WW Goal status: IN PROGRESS  4.  Pt to demo/report independence with household ambulation (no AD) x 100 ft Baseline: CGA x 30 ft Goal status: IN PROGRESS    ASSESSMENT:  CLINICAL IMPRESSION: Patient  arrived to session with R AFO donned. Provided cueing during gait training with and without SPC; patient was safe with and without AD today. Reviewed HEP which revealed mild sway. Progressed to dynamic standing balance activities with cueing to encourage equal LE use and recruitment of R quadriceps in standing. Patient reported understanding of edu provided and without complaints upon leaving.   OBJECTIVE IMPAIRMENTS: Abnormal gait, decreased activity tolerance, decreased balance, decreased coordination, decreased endurance, decreased knowledge of use of DME, difficulty walking, decreased ROM, decreased strength, dizziness, impaired flexibility, improper body mechanics, and pain.   ACTIVITY LIMITATIONS: carrying, lifting, bending, squatting, stairs, reach over head, and locomotion level  PARTICIPATION LIMITATIONS: meal prep, cleaning, interpersonal relationship, shopping, and community activity  PERSONAL FACTORS: Age, Time since onset of injury/illness/exacerbation, and 1-2 comorbidities: PMH are also affecting patient's functional outcome.   REHAB POTENTIAL: Good  CLINICAL DECISION MAKING: Evolving/moderate complexity  EVALUATION COMPLEXITY: Moderate  PLAN:  PT FREQUENCY: 1-2x/week  PT DURATION: 10 weeks  PLANNED INTERVENTIONS: 97750- Physical Performance Testing, 97110-Therapeutic exercises, 97530- Therapeutic activity, [id number]- Neuromuscular re-education, 97535- Self Care, 47829- Manual therapy, [id number]- Gait training, [phone]- Orthotic Initial, [phone]- Orthotic/Prosthetic subsequent, and [phone]- Aquatic Therapy  PLAN FOR NEXT SESSION: HEP review and progression for strength, gait training with R AFO and SPC   Thaddeus Filippo, PT, DPT 09/16/23 12:29 PM  [facility] [address], [address] [place], [place] [address] Phone # [phone] Fax # [phone]

## 2023-09-16 ENCOUNTER — Ambulatory Visit: Admitting: Occupational Therapy

## 2023-09-16 ENCOUNTER — Ambulatory Visit

## 2023-09-16 ENCOUNTER — Ambulatory Visit: Admitting: Physical Therapy

## 2023-09-16 ENCOUNTER — Encounter: Payer: Self-pay | Admitting: Physical Therapy

## 2023-09-16 DIAGNOSIS — R471 Dysarthria and anarthria: Secondary | ICD-10-CM

## 2023-09-16 DIAGNOSIS — M25511 Pain in right shoulder: Secondary | ICD-10-CM

## 2023-09-16 DIAGNOSIS — M6281 Muscle weakness (generalized): Secondary | ICD-10-CM

## 2023-09-16 DIAGNOSIS — R262 Difficulty in walking, not elsewhere classified: Secondary | ICD-10-CM

## 2023-09-16 DIAGNOSIS — I69351 Hemiplegia and hemiparesis following cerebral infarction affecting right dominant side: Secondary | ICD-10-CM | POA: Diagnosis not present

## 2023-09-16 DIAGNOSIS — R208 Other disturbances of skin sensation: Secondary | ICD-10-CM

## 2023-09-16 DIAGNOSIS — R278 Other lack of coordination: Secondary | ICD-10-CM

## 2023-09-16 DIAGNOSIS — R4701 Aphasia: Secondary | ICD-10-CM

## 2023-09-16 DIAGNOSIS — R2681 Unsteadiness on feet: Secondary | ICD-10-CM

## 2023-09-16 DIAGNOSIS — R41841 Cognitive communication deficit: Secondary | ICD-10-CM

## 2023-09-16 DIAGNOSIS — R2689 Other abnormalities of gait and mobility: Secondary | ICD-10-CM

## 2023-09-16 NOTE — Therapy (Signed)
 OUTPATIENT SPEECH LANGUAGE PATHOLOGY TREATMENT   Patient Name: Brittany Mullins MRN: [id number] DOB:Oct 05, 1980, 43 y.o., female Today's Date: 09/16/2023  PCP: Berneda Bridges, MD REFERRING PROVIDER: Genetta Kenning, MD  END OF SESSION:  End of Session - 09/16/23 1154     Visit Number 4    Number of Visits 17    Date for SLP Re-Evaluation 10/11/23   first ST 08/14/23   SLP Start Time 1103    SLP Stop Time  1145    SLP Time Calculation (min) 42 min    Activity Tolerance Patient tolerated treatment well           Past Medical History:  Diagnosis Date   Addison's disease (HCC)    Brain tumor (benign) (HCC)    Chronic pancreatitis (HCC)    Hearing loss    Hypertension    Marijuana use    Per pt: medical marijuana patient   Pancreatitis    PCOS (polycystic ovarian syndrome)    Pituitary tumor    Seizures (HCC)    Past Surgical History:  Procedure Laterality Date   ABDOMINAL HYSTERECTOMY     APPENDECTOMY     BIOPSY  08/16/2021   Procedure: BIOPSY;  Surgeon: Annis Kinder, DO;  Location: [facility];  Service: Gastroenterology;;   CESAREAN SECTION     CHOLECYSTECTOMY     ELBOW SURGERY     ESOPHAGOGASTRODUODENOSCOPY (EGD) WITH PROPOFOL  N/A 08/16/2021   Procedure: ESOPHAGOGASTRODUODENOSCOPY (EGD) WITH PROPOFOL ;  Surgeon: Annis Kinder, DO;  Location: [facility];  Service: Gastroenterology;  Laterality: N/A;   kidney stent     pancreatic stent     RADIOLOGY WITH ANESTHESIA N/A 04/25/2023   Procedure: MRI of Brain without contrast;  Surgeon: Radiologist, Medication, MD;  Location: [facility];  Service: Radiology;  Laterality: N/A;   RADIOLOGY WITH ANESTHESIA N/A 05/03/2023   Procedure: MRI WITH ANESTHESIA CERVICAL SPINE WITHOUT CONTRAST;  Surgeon: Radiologist, Medication, MD;  Location: [facility];  Service: Radiology;  Laterality: N/A;   Patient Active Problem List   Diagnosis Date Noted   Superficial thrombophlebitis 06/14/2023   Low protein S 05/19/2023    Sepsis (HCC) 05/19/2023   Blurry vision, left eye 05/19/2023   Nephrolithiasis 05/19/2023   Dysphagia 05/19/2023   Seizure (HCC) 05/19/2023   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   HTN (hypertension) 04/23/2023   Stroke (cerebrum) (HCC) 04/22/2023   Dizziness 04/22/2023   Intractable vomiting with nausea 08/17/2022   Abdominal pain, epigastric    Gastritis and gastroduodenitis    AKI (acute kidney injury) (HCC) 08/15/2021   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   Intractable nausea and vomiting with acute abdominal pain 10/25/2019   History of Addison's disease    Intractable cyclical vomiting with nausea    Chronic pain disorder 06/03/2017   Opioid dependence (HCC) 06/03/2017   Hypokalemia/hypomangesia 06/03/2017   Dehydration 06/03/2017   Generalized abdominal pain    Marijuana use    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  Rationale for Evaluation and Treatment: Rehabilitation  SUBJECTIVE:   SUBJECTIVE STATEMENT: I am starting to notice a difference in my drooling. I have done the exercises every day.  Pt accompanied by: self  PERTINENT HISTORY: presented to ED 04/22/23 with right sided weakness, facial numbness and asymmetry. MRI  brain with acute left pontine infarct. PMHx Addison's disease, brain tumor (benign), Hypertension, Marijuana use, opiod dependence - methadone , Pancreatitis, PCOS (polycystic ovarian syndrome), Pituitary tumor, and Seizures.   PAIN:  Are you having pain? Yes: NPRS scale: 7/10 Pain location: rt shoulder Pain description: stabbing/throbbing Aggravating factors: movement raising hand Relieving factors: heat  FALLS: Has patient fallen in last 6 months?  Yes, 1 since last session, due to off  balance  LIVING ENVIRONMENT: Lives with: lives with an adult companion Lives in: House/apartment  PLOF:  Level of assistance: Independent with ADLs, Independent with IADLs Employment: On disability (Addison's Syndrome)  PATIENT GOALS: I want my memory to be better.  OBJECTIVE:  Note: Objective measures were completed at Evaluation unless otherwise noted.  DIAGNOSTIC FINDINGS:  MR 04/22/23 IMPRESSION: Acute left pontine infarct.   PATIENT REPORTED OUTCOME MEASURES (PROM): Cognitive Function: provided 08/19/23, which pt filled out in lobby and returned. She scored herself 78/140, with lower scores indicating more of an affect of pt's deficits on QOL.Pt scored herself 1 meaning very often with having trouble remembering new information like simple instructions, difficulty doing more than one thing at a time, and instances of anomia. 2 (often, or a lot) was scored with difficulty managing time to do daily activities, trouble remembering a list of 4-5 items without writing it down, trouble remembering things I am supposed to do (taking meds or buying something that was necessary), and had more difficulty thinking clearly.                                                                                                                            TREATMENT DATE:  Semantic Feature Analysis=SFA  09/16/23: SLP to again review memory strategies next session.  Pt out with strep throat last week. Continues to write down the words she experiences with anomia, and making 3-5 sentences with those words to encourage semantic relationships. Her anomia is occurring less than it was for last session - now occurring approx once every 3 days. SLP introduced SFA today with pt and educated her how to complete this task. She will use her 10 sheets and then will cease using SFA. Pt using dysarthria HEP every day at home and finding she does not notice anterior labial leakage > every third day. Completed  exercises today with modified independence.  SLP educated pt on energy conservation (due to anomia largely coming after 3pm), and also on memory strategies. She told SLP she wrote down a code that she would need to use later because she thought she might forget it.   08/19/23: Pt's S statement explained; She said Knife said instead of gun, and pt could not generate the word oatmeal or the word parking garage. SLP educated pt about anomia compensations completed today. See pt instructions. Pt had more questions about circumlocution which SLP answered to her satisfaction. That was very helpful, because now I know other ways to get my point across. SLP and  pt reviewed pt's oral motor HEP - she req'd min-mod A, occasionally. By session end she had HEP procedure correct.  08/14/23: Needs PROM next session. Pt arrives today troubled about her drooling from her rt labial margin and would like some exercises to work on this. SLP introduced oral motor exercises (see pt instructions). Pt req'd usual mod A for proper procedure. Pt did not begin to track anomia as she forgot. SLP cued pt for what she could do to make this occur. - her suggestion to text [name] and ask him. SLP suggested she do something herself and she set reminder to put paper on frig or make note in her phone for anomia. SLP suggested [name] be a backup for her only when she forgets. Pt thanked SLP for session today.  07/25/23: SLP shared role of SLP in ST rehab for cognition and for aphasia. SLP ascertained pt was satisfied with her speech currently and would prefer to work on cognition and language. SLP shared general goals with pt and she was amenable to these. SLP talked with pt about some compensations to attempt at home such as using her phone for reminders and alarms to assist in recall. Lastly, SLP told pt to track her incidents of anomia and to share these with SLP.  PATIENT EDUCATION: Education details: see treatment date  above Person educated: Patient Education method: Explanation, Demonstration, Verbal cues, and Handouts Education comprehension: verbalized understanding, returned demonstration, verbal cues required, and needs further education   GOALS: Goals reviewed with patient? Yes  SHORT TERM GOALS: Target date:  09/27/23 (due to visit #)  Pt will tell SLP three trained anomia strategies Baseline: Goal status: INITIAL  2.  Pt will use trained one memory compensation successfully between 3 sessions Baseline: 09/16/23 Goal status: INITIAL  3.  Pt will demo WFL alternating attention between two simple tasks in 3 sessions with mod I Baseline:  Goal status: INITIAL  4.  Pt will bring planner to at least 5 sessions and demo [facility] appointment tracking in 4/5 of those sessions Baseline:  Goal status: INITIAL  5.  Pt will complete oral motor HEP (for decr frequency of drooling) with mod I in 2 sessions Baseline:  Goal status: INITIAL  6.  Pt will complete CLQT or FAVRES Baseline:  Goal status: deferred - given pt's limited sessions for therapy vs. evaluation   LONG TERM GOALS: Target date: 10/11/23  Pt will improve PROM compared to iniital administration Baseline:  Goal status: INITIAL  2.  Pt will use/report using trained anomia strategies successfully in/between 3 sessions Baseline:  Goal status: INITIAL  3.  Pt will use/report use of at least two trained memory compensations successfully in/between 3 sessions Baseline:  Goal status: INITIAL  4.  Pt will pay at least 3 bills successfully using compensations Baseline:  Goal status: INITIAL  5.  Pt will demo WFL alternating attention in two min-mod complex tasks in 3 sessions Baseline:  Goal status: INITIAL  6.  Pt will use compensations for anomia successfully in 15 minutes mod complex conversation in 3 sessions. Baseline:  Goal status: INITIAL   ASSESSMENT:  CLINICAL IMPRESSION: Modified STG date due to visit number. Pt will  focus on therapeutic info rather than taking sessions for formal cognitive evaluation due to limited visits, and will treat pt's sx. Patient is a 43 y.o. F who was seen today for treatment of speech and for cognitive communication following pontine CVA in January 2025. See treatment date above for today's  date for further details on today's session. Pt endorses some occasional aphasia (anomia) approx every other day. Pt stated today she has noticed a decr in frequency of drooling as she has been faithful in completing her oral motor exercises At this time pt is satisfied with her speech which is mildly dysarthric but 100% intelligible.   OBJECTIVE IMPAIRMENTS: include attention, memory, aphasia, and dysarthria. These impairments are limiting patient from managing medications, managing appointments, managing finances, household responsibilities, ADLs/IADLs, and effectively communicating at home and in community. Factors affecting potential to achieve goals and functional outcome are ability to learn/carryover information and severity of impairments. Patient will benefit from skilled SLP services to address above impairments and improve overall function. Pt may require an assessment of aphasia or of word finding during this therapy course.  REHAB POTENTIAL: Good  PLAN:  SLP FREQUENCY: 1x/week  SLP DURATION: 8 weeks  PLANNED INTERVENTIONS: Language facilitation, Environmental controls, Cueing hierachy, Cognitive reorganization, Internal/external aids, Oral motor exercises, Functional tasks, Multimodal communication approach, SLP instruction and feedback, Compensatory strategies, Patient/family education, and [id number] Treatment of speech (30 or 45 min)     Ettie Krontz, CCC-SLP 09/16/2023, 11:54 AM

## 2023-09-16 NOTE — Therapy (Signed)
 OUTPATIENT OCCUPATIONAL THERAPY NEURO  Treatment Note  Patient Name: Brittany Mullins MRN: [id number] DOB:04-Dec-1980, 43 y.o., female Today's Date: 09/16/2023  PCP: Berneda Bridges, MD REFERRING PROVIDER: Genetta Kenning, MD  END OF SESSION:  OT End of Session - 09/16/23 1032     Visit Number 2    Number of Visits 13    Date for OT Re-Evaluation 10/18/23    Authorization Type Medicare Part A&B / [employer] 2025    Authorization Time Period 27 visit limit OT/PT/ST    OT Start Time 1020    OT Stop Time 1100    OT Time Calculation (min) 40 min           Past Medical History:  Diagnosis Date   Addison's disease (HCC)    Brain tumor (benign) (HCC)    Chronic pancreatitis (HCC)    Hearing loss    Hypertension    Marijuana use    Per pt: medical marijuana patient   Pancreatitis    PCOS (polycystic ovarian syndrome)    Pituitary tumor    Seizures (HCC)    Past Surgical History:  Procedure Laterality Date   ABDOMINAL HYSTERECTOMY     APPENDECTOMY     BIOPSY  08/16/2021   Procedure: BIOPSY;  Surgeon: Annis Kinder, DO;  Location: [facility];  Service: Gastroenterology;;   CESAREAN SECTION     CHOLECYSTECTOMY     ELBOW SURGERY     ESOPHAGOGASTRODUODENOSCOPY (EGD) WITH PROPOFOL  N/A 08/16/2021   Procedure: ESOPHAGOGASTRODUODENOSCOPY (EGD) WITH PROPOFOL ;  Surgeon: Annis Kinder, DO;  Location: [facility];  Service: Gastroenterology;  Laterality: N/A;   kidney stent     pancreatic stent     RADIOLOGY WITH ANESTHESIA N/A 04/25/2023   Procedure: MRI of Brain without contrast;  Surgeon: Radiologist, Medication, MD;  Location: [facility] OR;  Service: Radiology;  Laterality: N/A;   RADIOLOGY WITH ANESTHESIA N/A 05/03/2023   Procedure: MRI WITH ANESTHESIA CERVICAL SPINE WITHOUT CONTRAST;  Surgeon: Radiologist, Medication, MD;  Location: [facility] OR;  Service: Radiology;  Laterality: N/A;   Patient Active Problem List   Diagnosis Date Noted   Superficial  thrombophlebitis 06/14/2023   Low protein S 05/19/2023   Sepsis (HCC) 05/19/2023   Blurry vision, left eye 05/19/2023   Nephrolithiasis 05/19/2023   Dysphagia 05/19/2023   Seizure (HCC) 05/19/2023   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   HTN (hypertension) 04/23/2023   Stroke (cerebrum) (HCC) 04/22/2023   Dizziness 04/22/2023   Intractable vomiting with nausea 08/17/2022   Abdominal pain, epigastric    Gastritis and gastroduodenitis    AKI (acute kidney injury) (HCC) 08/15/2021   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   Intractable nausea and vomiting with acute abdominal pain 10/25/2019   History of Addison's disease    Intractable cyclical vomiting with nausea    Chronic pain disorder 06/03/2017   Opioid dependence (HCC) 06/03/2017   Hypokalemia/hypomangesia 06/03/2017   Dehydration 06/03/2017   Generalized abdominal pain    Marijuana use    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   Bipolar I disorder with mixed features (HCC) 07/07/2012    ONSET DATE: 04/22/23  REFERRING DIAG: W09.81 (ICD-10-CM) - Cerebrovascular accident (CVA) due to thrombosis of basilar artery  THERAPY DIAG:  Muscle weakness (generalized)  Hemiplegia and hemiparesis following cerebral infarction affecting right dominant side (HCC)  Other disturbances of skin sensation  Other lack of coordination  Right shoulder pain, unspecified chronicity  Rationale for Evaluation and Treatment: Rehabilitation  SUBJECTIVE:   SUBJECTIVE STATEMENT: Pt  reports feeling a lot better today as she had strep throat last week. Pt accompanied by: self (dropped off)  PERTINENT HISTORY: RH- female with Addison's disease, PCOS, pancreatitis with chronic N/V, seizures, PUD/duodenitis, chronic pain (abdominal due to pancreatitis)- on methadone , bipolar d/o, marijuana use who was admitted to [facility] on [facility]23 with reports of right sided numbness that started day before as well as reports of abnormal gait with complaints of  left calf pain per family. UDS positive for THC. CTA head/neck was negative for LVO. MRI brain done revealing acute paramedian left pontine infarct and ultrasound BLE dopplers were negative for DVT. She was noted to have slurred speech with intermittent aphasia and recommended DAPT X 3 weeks for stroke felt to be due to small vessel disease.  PRECAUTIONS: Fall  WEIGHT BEARING RESTRICTIONS: No  PAIN:  Are you having pain? Yes: NPRS scale: 5/10 Pain location: R ankle Pain description: aching Aggravating factors: when I put pressure through my foot/leg Relieving factors: repositioning  FALLS: Has patient fallen in last 6 months? Yes. Number of falls ~5, balance is off  LIVING ENVIRONMENT: Lives with: lives with their partner (fiance) Lives in: House/apartment Stairs: No Has following equipment at home: Environmental consultant - 4 wheeled, Shower bench, bed side commode, and AFO  PLOF: Independent, Independent with basic ADLs, and Vocation/Vocational requirements: on disability - previously working, cleaning at [employer]  PATIENT GOALS: get back to being independent  OBJECTIVE:  Note: Objective measures were completed at Evaluation unless otherwise noted.  HAND DOMINANCE: Ambidextrous, can use both but prefers R for cutting, writing, eating  ADLs: Transfers/ambulation related to ADLs: Rollator for mobility at all times Eating: get choked easy Grooming: difficulty with brushing/styling hair and applying makeup due to R shoulder pain and shakiness in R hand UB Dressing: occasional difficulty getting shirt over head LB Dressing: difficulty with threading pants over feet, needs assistance with AFO, occasional assistance to get R shoe on Toileting: difficulty with hygiene post toileting Bathing: difficulty with washing bottom Tub Shower transfers: currently getting into bath tub for bathing, fiance will have to aid in lifting her out afterwards.  Reports having a tub bench but that it  doesn't fit in her shower. Equipment: Transfer tub bench, bed side commode, and Reacher  IADLs: Light housekeeping: have tried to do the dishes, but typically uses dishwasher.  Pt with difficulty emptying washer/dryer and dishwasher.   Meal Prep: can do simple sandwich/cereal and microwave meals, but not cooking due to concern of cutting or burning self Community mobility: not currently driving Medication management: family member assists with filling weekly pill box Financial management: fiance is managing at this time - reports memory impairment Handwriting: 100% legible; PPT #1(whales live in [place]): 17.28 sec  MOBILITY STATUS: Needs Assist: utilizing Rollator for mobility  POSTURE COMMENTS:  rounded shoulders and forward head  ACTIVITY TOLERANCE: Activity tolerance: diminished, reports fatiguing quickly with attempts to grocery shop  FUNCTIONAL OUTCOME MEASURES: PSFS 3.3   UPPER EXTREMITY ROM:    Active ROM Right eval Left eval  Shoulder flexion 112   Shoulder abduction 72   Shoulder adduction    Shoulder extension    Shoulder internal rotation 50%, able to reach to top of hip   Shoulder external rotation 95%   Elbow flexion    Elbow extension    Wrist flexion    Wrist extension    Wrist ulnar deviation    Wrist radial deviation    Wrist pronation 90%  Wrist supination 75%   (Blank rows = not tested)  UPPER EXTREMITY MMT:     MMT Right eval Left eval  Shoulder flexion    Shoulder abduction    Shoulder adduction    Shoulder extension    Shoulder internal rotation    Shoulder external rotation    Middle trapezius    Lower trapezius    Elbow flexion    Elbow extension    Wrist flexion    Wrist extension    Wrist ulnar deviation    Wrist radial deviation    Wrist pronation    Wrist supination    (Blank rows = not tested)  HAND FUNCTION: 09/16/23: right: 41, 40, 30 (average 37#) lbs and left: 63, 55, 55 (average 57.6#)  lbs.  COORDINATION: Finger Nose Finger test: Good symmetry bilaterally, noted guarding in R shoulder with movement and slower by 50% on R 9 Hole Peg test: Right: 43.47 sec; Left: 25.72 sec - pt with decreased use of index finger on R hand when placing and removing pegs Box and Blocks:  Right 32 blocks, Left 48 blocks  SENSATION: Pt reports impaired sensation  COGNITION: Overall cognitive status: Within functional limits for tasks assessed; does report changes in memory  VISION: Subjective report: reports difficulty with peripheral vision, blurriness in periphery Baseline vision: Wears glasses for distance only  VISION ASSESSMENT: Not tested To be further assessed in functional context                                                                                                                             TREATMENT DATE:  09/16/23 Grip strength: right: 41, 40, 30 (average 37#) lbs and left: 63, 55, 55 (average 57.6#) lbs. DME: OT educated pt on proper setup of tub bench to increase safety with tub/shower transfers.  OT educated pt on placement of shower curtain behind seat (within tub) and through slats in seat and infront of seat (in tub).  OT providing demonstration and pictures for carryover. AE: OT educated pt on use of reacher to aid in donning pants, educating also on hemi-dressing technique with dressing RLE first.  OT provided demonstration and pt returned demonstration with use of gait belt and reacher to simulate donning pants.  OT provided handout on hemi-dressing with reacher (see pt instructions). OT educated on use of sock aid and shoe funnel to aid in donning socks and shoes.  Pt trialed both on with good success.  OT provided pt with pictures of each and encouraged pt to purchase items as needed.      09/02/23 Educated on role and purpose of OT as well as potential interventions and goals for therapy based on initial evaluation findings.  Engaged in discussion regarding  typical routine for bathing and potential benefit from use of AE and/or DME to increase ease and independence with bathing and LB dressing.    PATIENT EDUCATION: Education details: DME and AE for LB dressing and  safe tub/shower transfers Person educated: Patient Education method: Explanation Education comprehension: verbalized understanding and needs further education  HOME EXERCISE PROGRAM: DME/AE (see pt instructions)   GOALS: Goals reviewed with patient? Yes  SHORT TERM GOALS: Target date: 09/27/23  Pt will be independent in ROM and coordination HEP with use of visual handouts. Baseline: new to OP OT Goal status: INITIAL  2.  Pt will verbalize understanding of task modifications and/or potential DME/AE needs to increase ease, safety, and independence w/ ADLs. Baseline: has reacher and tub bench but is not using either Goal status: in progress  3.  Pt will verbalize decreased pain in R shoulder to <5/10 to allow for increased functional use. Baseline: pain 7/10 on eval, reports difficulty with styling hair Goal status: INITIAL   LONG TERM GOALS: Target date: 10/18/23  Pt will verbalize and/or demonstrate PRN safe tub/shower transfers with use of DME PRN. Baseline: has tub bench but reports that it does not fit in her shower Goal status: in progress  2.  Pt will be Mod I with LB dressing, to include donning shoe and AFO, with use of AE PRN. Baseline: needs help donning underwear/pants over R leg, shoe and AFO Goal status: in progress  3.   Pt will demonstrate improved functional ROM of RUE to aid in ability to pull shirt over head and complete hygiene post toileting. Baseline: decreased R shoulder ROM, impaired sensation, and shoulder pain Goal status: INITIAL  4.  Pt will demonstrate improved UE functional use for ADLs as evidenced by increasing box/ blocks score by 5 blocks with RUE. Baseline: Right 32 blocks, Left 48 blocks Goal status: INITIAL  5.  Patient will  report at least two-point increase in average PSFS score or at least three-point increase in a single activity score indicating functionally significant improvement given minimum detectable change. Baseline: 3.3 Goal status: INITIAL  ASSESSMENT:  CLINICAL IMPRESSION: Pt demonstrating good use of AE to in crease independence with LB dressing, pt benefiting from use of sock aid and reacher to aid in donning pants and socks.  Pt also provided with demonstration and images to increase safe use of tub transfer bench for bathing.  Pt will continue to benefit from skilled occupational therapy services to address strength and coordination, ROM, pain management, altered sensation, balance, GM/FM control, cognition, safety awareness, introduction of compensatory strategies/AE prn, visual-perception, and implementation of an HEP to improve participation and safety during ADLs and IADLs.    PERFORMANCE DEFICITS: in functional skills including ADLs, IADLs, coordination, sensation, ROM, strength, pain, Fine motor control, Gross motor control, balance, body mechanics, endurance, decreased knowledge of precautions, decreased knowledge of use of DME, vision, and UE functional use, cognitive skills including memory, and psychosocial skills including environmental adaptation, habits, and routines and behaviors.    PLAN:  OT FREQUENCY: 2x/week  OT DURATION: 6 weeks  PLANNED INTERVENTIONS: 97168 OT Re-evaluation, 97535 self care/ADL training, 40981 therapeutic exercise, 97530 therapeutic activity, 97112 neuromuscular re-education, 97035 ultrasound, [id number] electrical stimulation (manual), 97760 Orthotic Initial, [id number] Orthotic/Prosthetic subsequent, passive range of motion, functional mobility training, compression bandaging, visual/perceptual remediation/compensation, psychosocial skills training, energy conservation, coping strategies training, patient/family education, and DME and/or AE instructions  RECOMMENDED  OTHER SERVICES: NA  CONSULTED AND AGREED WITH PLAN OF CARE: Patient  PLAN FOR NEXT SESSION: initiate shoulder ROM HEP, initiate coordination HEP, discuss modalities for pain management, review education on AE/DME for shower transfers and LB dressing   Zetha Kuhar, OTR/L 09/16/2023, 10:32 AM  [facility] [address] [place], [place] [address] Phone # [phone] Fax # [phone]

## 2023-09-16 NOTE — Patient Instructions (Signed)

## 2023-09-16 NOTE — Patient Instructions (Signed)
 Brittany Mullins

## 2023-09-18 ENCOUNTER — Ambulatory Visit: Admitting: Physical Therapy

## 2023-09-18 ENCOUNTER — Ambulatory Visit: Admitting: Occupational Therapy

## 2023-09-18 ENCOUNTER — Ambulatory Visit

## 2023-09-18 NOTE — Therapy (Incomplete)
 OUTPATIENT PHYSICAL THERAPY NEURO TREATMENT   Patient Name: Brittany Mullins MRN: [id number] DOB:06-05-1980, 43 y.o., female Today's Date: 09/18/2023   PCP: Berneda Bridges, MD REFERRING PROVIDER: Genetta Kenning, MD  END OF SESSION:     Past Medical History:  Diagnosis Date   Addison's disease [facility])    Brain tumor (benign) (HCC)    Chronic pancreatitis (HCC)    Hearing loss    Hypertension    Marijuana use    Per pt: medical marijuana patient   Pancreatitis    PCOS (polycystic ovarian syndrome)    Pituitary tumor    Seizures (HCC)    Past Surgical History:  Procedure Laterality Date   ABDOMINAL HYSTERECTOMY     APPENDECTOMY     BIOPSY  08/16/2021   Procedure: BIOPSY;  Surgeon: Annis Kinder, DO;  Location: [facility];  Service: Gastroenterology;;   CESAREAN SECTION     CHOLECYSTECTOMY     ELBOW SURGERY     ESOPHAGOGASTRODUODENOSCOPY (EGD) WITH PROPOFOL  N/A 08/16/2021   Procedure: ESOPHAGOGASTRODUODENOSCOPY (EGD) WITH PROPOFOL ;  Surgeon: Annis Kinder, DO;  Location: [facility];  Service: Gastroenterology;  Laterality: N/A;   kidney stent     pancreatic stent     RADIOLOGY WITH ANESTHESIA N/A 04/25/2023   Procedure: MRI of Brain without contrast;  Surgeon: Radiologist, Medication, MD;  Location: [facility] OR;  Service: Radiology;  Laterality: N/A;   RADIOLOGY WITH ANESTHESIA N/A 05/03/2023   Procedure: MRI WITH ANESTHESIA CERVICAL SPINE WITHOUT CONTRAST;  Surgeon: Radiologist, Medication, MD;  Location: [facility] OR;  Service: Radiology;  Laterality: N/A;   Patient Active Problem List   Diagnosis Date Noted   Superficial thrombophlebitis 06/14/2023   Low protein S 05/19/2023   Sepsis (HCC) 05/19/2023   Blurry vision, left eye 05/19/2023   Nephrolithiasis 05/19/2023   Dysphagia 05/19/2023   Seizure (HCC) 05/19/2023   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   HTN (hypertension) 04/23/2023   Stroke (cerebrum) (HCC) 04/22/2023   Dizziness 04/22/2023    Intractable vomiting with nausea 08/17/2022   Abdominal pain, epigastric    Gastritis and gastroduodenitis    AKI (acute kidney injury) (HCC) 08/15/2021   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   Intractable nausea and vomiting with acute abdominal pain 10/25/2019   History of Addison's disease    Intractable cyclical vomiting with nausea    Chronic pain disorder 06/03/2017   Opioid dependence (HCC) 06/03/2017   Hypokalemia/hypomangesia 06/03/2017   Dehydration 06/03/2017   Generalized abdominal pain    Marijuana use    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  Rationale for Evaluation and Treatment: Rehabilitation  SUBJECTIVE:  SUBJECTIVE STATEMENT: ***Feeling better after having strep throat. Nothing else new.   Pt accompanied by: self  PERTINENT HISTORY: history of Addison's disease, hypertension, chronic pancreatitis, chronic pain on Methadone , chronic nausea, history of prolactinoma, bipolar disorder, left pontine stroke in 04/2023, protein S deficiency, and seizuresadmitted on 04/22/2023 for right-sided numbness and abnormal gait with left calf pain. MRI brain showed an acute paramedian left pontine infarct.  PAIN:  Are you having pain? Yes: NPRS scale: 4-5/10 Pain location: R ankle Pain description: aching Aggravating factors: ever since the stroke Relieving factors: heat  PRECAUTIONS: Fall  RED FLAGS: None   WEIGHT BEARING RESTRICTIONS: No  FALLS: Has patient fallen in last 6 months? Yes. Number of falls 5 no injuries other than skin tear  LIVING  ENVIRONMENT: Lives with: lives with an adult companion Lives in: House/apartment, no stairs   PLOF:  Level of assistance: Independent with ADLs, Independent with IADLs Employment: On disability (Addison's Syndrome)    PATIENT GOALS: walk without AD  OBJECTIVE:     TODAY'S TREATMENT: 09/18/2023 Activity Comments                       TODAY'S TREATMENT: 09/16/23 Activity Comments  Gait training with R AFO and no AD x200 Cueing to elongate R step and relax R arm down by sides (R UE flexor tone kicks in with gait)  Gait training with SPC 80ft  Improved stability and continuity of stepping; good sequencing after cueing and demo  Review of HEP: Standing EC 30 Romberg 30  Romberg + head turns 30  Mild ant/pos sway; cues to maintain torso still with head turns   Standing lateral wt shift  Tendency for R knee to bend upon wt shift; used manual cues and mirror feedback   Standing  ant/pos wt shift Cues to keep feet down and encourage equal wt shift; mirror feedback  Standing green TB TKE 10x3 With mirror; still with tendency to shift L slightly upon extending R knee     HOME EXERCISE PROGRAM Last updated: 09/16/23 Access Code: [email or web address] URL: [email or web address] Date: 09/16/2023 Prepared by: [facility] - [facility]  Exercises - Standing Balance in Corner with Eyes Closed  - 1 x daily - 7 x weekly - 3 sets - 30 sec hold - Standing Terminal Knee Extension with Resistance  - 1 x daily - 5 x weekly - 2 sets - 10 reps - 5 sec hold - Side to Side Weight Shift with Counter Support  - 1 x daily - 5 x weekly - 2 sets - 10 reps   PATIENT EDUCATION: Education details: encouraged wearing sock under AFO; encouraged obtaining SPC; HEP update and consolidation with edu for safety Person educated: Patient Education method: Explanation, Demonstration, Tactile cues, Verbal cues, and Handouts Education comprehension: verbalized understanding and  returned demonstration     Note: Objective measures were completed at Evaluation unless otherwise noted.  DIAGNOSTIC FINDINGS:   COGNITION: Overall cognitive status: Within functional limits for tasks assessed   SENSATION: Not tested  COORDINATION: Difficulty with rapid toe tap RLE Heel to shin impaired w/ RLE    MUSCLE TONE: NT   DTRs:  NT  POSTURE: rounded shoulders and forward head  LOWER EXTREMITY ROM:     Active  Right Eval Left Eval  Hip flexion    Hip extension    Hip abduction    Hip adduction    Hip internal rotation    Hip  external rotation    Knee flexion    Knee extension    Ankle dorsiflexion 5 15  Ankle plantarflexion    Ankle inversion    Ankle eversion     (Blank rows = not tested)  LOWER EXTREMITY MMT:    MMT Right Eval Left Eval  Hip flexion    Hip extension    Hip abduction 3+ 5  Hip adduction    Hip internal rotation    Hip external rotation    Knee flexion 4 5  Knee extension 3+ 5  Ankle dorsiflexion 3- 5  Ankle plantarflexion 2+ 3  Ankle inversion    Ankle eversion    (Blank rows = not tested)  BED MOBILITY:  Not tested--reports indep  TRANSFERS: Sit-stand/chair-chair independent Floor to stand: DNT    CURB:  Findings: SBA-CGA  STAIRS: Findings: Level of Assistance: CGA, Number of Stairs: 6, Height of Stairs: 4-6   , and Comments: BHR and step-to GAIT: Findings: Gait Characteristics: decreased stance time- Right, decreased stride length, and poor foot clearance- Right, Distance walked:  , and Comments: reports endurance for about 6-10 minutes of walking w/ 4WW Level surfaces w/out AD and CGA x 30 ft  FUNCTIONAL TESTS:  5 times sit to stand: 15 Timed up and go (TUG):  10 Meter Walk Test: 25.34 sec = 1.29 ft sec Dynamic Gait Index: 8/24    Berg Balance Scale:  Item Test date: 09/02/23 Test date:  Test date:   Sitting to standing 4. able to stand without using hands and stabilize independently Insert  OPRCBERGREEVAL SmartPhrase at re-test date Insert OPRCBERGREEVAL SmartPhrase at re-test date  2. Standing unsupported 4. able to stand safely for 2 minutes    3. Sitting with back unsupported, feet supported 4. able to sit safely and securely for 2 minutes    4. Standing to sitting 4. sits safely with minimal use of hands    5. Pivot transfer  4. able to transfer safely with minor use of hands    6. Standing unsupported with eyes closed 3. able to stand 10 seconds with supervision    7. Standing unsupported with feet together 3. able to place feet together independently and stand 1 minute with supervision    8. Reaching forward with outstretched arms while standing 3. can reach forward 12 cm (5 inches)    9. Pick up object from the floor from standing 4. able to pick up slipper safely and easily    10. Turning to look behind over left and right shoulders while standing 3. looks behind one side only, other side shows less weight shift    11. Turn 360 degrees 3. able to turn 360 degrees safely, one side only, in 4 seconds or less    12. Place alternate foot on step or stool while standing unsupported 2. able to complete 4 steps without aid with supervision    13. Standing unsupported one foot in front 2. able to take small step independently and hold 30 seconds    14. Standing on one leg 3. able to lift leg independently and hold 5-10 seconds     Total Score 46/56 Total Score /56 Total Score /56  TREATMENT DATE: 09/02/23    PATIENT EDUCATION: Education details: assessment details Person educated: Patient Education method: Explanation Education comprehension: needs further education  HOME EXERCISE PROGRAM: Access Code: [email or web address] URL: [email or web address] Date: 09/02/2023 Prepared by: Tedd Favorite  Exercises - Standing Balance in Corner with Eyes  Closed  - 1 x daily - 7 x weekly - 3 sets - 30 sec hold - Corner Balance Feet Together With Eyes Open  - 1 x daily - 7 x weekly - 3 sets - 30 sec hold - Corner Balance Feet Together: Eyes Open With Head Turns  - 1 x daily - 7 x weekly - 3 sets - 5 reps  GOALS: Goals reviewed with patient? Yes  SHORT TERM GOALS: Target date: 09/30/2023    Patient will be independent in HEP to improve functional outcomes Baseline: Goal status: IN PROGRESS  2.  Demo improved standing balance and reduced risk for falls per score 52/56 Berg Balance Test for greater safety during ADL Baseline: 46/56 Goal status: IN PROGRESS  3.  Ambulation level surfaces w/out AD and supervision x 100 ft for improved household safety/ambulation Baseline: CGA x 30 ft Goal status: IN PROGRESS    LONG TERM GOALS: Target date: 11/11/2023    Demo improved balance and reduce risk for falls per score 17/24 Dynamic Gait Index Baseline: 8/24 Goal status: IN PROGRESS  2.  Report/demo improved ambulation endurance per report of 20 min continuous walking to improve community outings/endurance Baseline: reports 5-10 min limit Goal status: IN PROGRESS  3.  Demo modified independence with cane over various surfaces to improve efficiency for community ambulation Baseline: 4WW Goal status: IN PROGRESS  4.  Pt to demo/report independence with household ambulation (no AD) x 100 ft Baseline: CGA x 30 ft Goal status: IN PROGRESS    ASSESSMENT:  CLINICAL IMPRESSION: Pt presents today ***. Skilled PT session focused on ***. Pt needs ***. Pt will continue to benefit from skilled PT towards goals for improved functional mobility and decreased fall risk.   Patient arrived to session with R AFO donned. Provided cueing during gait training with and without SPC; patient was safe with and without AD today. Reviewed HEP which revealed mild sway. Progressed to dynamic standing balance activities with cueing to encourage equal LE use and  recruitment of R quadriceps in standing. Patient reported understanding of edu provided and without complaints upon leaving.   OBJECTIVE IMPAIRMENTS: Abnormal gait, decreased activity tolerance, decreased balance, decreased coordination, decreased endurance, decreased knowledge of use of DME, difficulty walking, decreased ROM, decreased strength, dizziness, impaired flexibility, improper body mechanics, and pain.   ACTIVITY LIMITATIONS: carrying, lifting, bending, squatting, stairs, reach over head, and locomotion level  PARTICIPATION LIMITATIONS: meal prep, cleaning, interpersonal relationship, shopping, and community activity  PERSONAL FACTORS: Age, Time since onset of injury/illness/exacerbation, and 1-2 comorbidities: PMH are also affecting patient's functional outcome.   REHAB POTENTIAL: Good  CLINICAL DECISION MAKING: Evolving/moderate complexity  EVALUATION COMPLEXITY: Moderate  PLAN:  PT FREQUENCY: 1-2x/week  PT DURATION: 10 weeks  PLANNED INTERVENTIONS: 97750- Physical Performance Testing, 97110-Therapeutic exercises, 97530- Therapeutic activity, 97112- Neuromuscular re-education, 97535- Self Care, 54098- Manual therapy, [phone]- Gait training, [phone]- Orthotic Initial, [phone]- Orthotic/Prosthetic subsequent, and [phone]- Aquatic Therapy  PLAN FOR NEXT SESSION: ***HEP review and progression for strength, gait training with R AFO and SPC   Dessie Flow, PT 09/18/23 8:11 AM Phone: [phone] Fax: [phone]   [facility] [address] [place], [place] [address] Phone # [phone] Fax # [phone]

## 2023-09-23 ENCOUNTER — Ambulatory Visit

## 2023-09-23 DIAGNOSIS — R41841 Cognitive communication deficit: Secondary | ICD-10-CM

## 2023-09-23 DIAGNOSIS — I69351 Hemiplegia and hemiparesis following cerebral infarction affecting right dominant side: Secondary | ICD-10-CM | POA: Diagnosis not present

## 2023-09-23 DIAGNOSIS — R4701 Aphasia: Secondary | ICD-10-CM

## 2023-09-23 DIAGNOSIS — R471 Dysarthria and anarthria: Secondary | ICD-10-CM

## 2023-09-23 NOTE — Therapy (Signed)
 OUTPATIENT SPEECH LANGUAGE PATHOLOGY TREATMENT/RECERTIFICATION   Patient Name: Brittany Mullins MRN: [id number] DOB:1980/07/28, 43 y.o., female Today's Date: 09/23/2023  PCP: Alec House, MD REFERRING PROVIDER: Carilyn Prentice BRAVO, MD  END OF SESSION:  End of Session - 09/23/23 1015     Visit Number 5    Number of Visits 17    Date for SLP Re-Evaluation 11/15/23   due to scheduling challenges   SLP Start Time 1016    SLP Stop Time  1100    SLP Time Calculation (min) 44 min    Activity Tolerance Patient tolerated treatment well           Past Medical History:  Diagnosis Date   Addison's disease (HCC)    Brain tumor (benign) (HCC)    Chronic pancreatitis (HCC)    Hearing loss    Hypertension    Marijuana use    Per pt: medical marijuana patient   Pancreatitis    PCOS (polycystic ovarian syndrome)    Pituitary tumor    Seizures (HCC)    Past Surgical History:  Procedure Laterality Date   ABDOMINAL HYSTERECTOMY     APPENDECTOMY     BIOPSY  08/16/2021   Procedure: BIOPSY;  Surgeon: San Sandor GAILS, DO;  Location: [facility];  Service: Gastroenterology;;   CESAREAN SECTION     CHOLECYSTECTOMY     ELBOW SURGERY     ESOPHAGOGASTRODUODENOSCOPY (EGD) WITH PROPOFOL  N/A 08/16/2021   Procedure: ESOPHAGOGASTRODUODENOSCOPY (EGD) WITH PROPOFOL ;  Surgeon: San Sandor GAILS, DO;  Location: [facility];  Service: Gastroenterology;  Laterality: N/A;   kidney stent     pancreatic stent     RADIOLOGY WITH ANESTHESIA N/A 04/25/2023   Procedure: MRI of Brain without contrast;  Surgeon: Radiologist, Medication, MD;  Location: [facility];  Service: Radiology;  Laterality: N/A;   RADIOLOGY WITH ANESTHESIA N/A 05/03/2023   Procedure: MRI WITH ANESTHESIA CERVICAL SPINE WITHOUT CONTRAST;  Surgeon: Radiologist, Medication, MD;  Location: [facility];  Service: Radiology;  Laterality: N/A;   Patient Active Problem List   Diagnosis Date Noted   Superficial thrombophlebitis 06/14/2023    Low protein S 05/19/2023   Sepsis (HCC) 05/19/2023   Blurry vision, left eye 05/19/2023   Nephrolithiasis 05/19/2023   Dysphagia 05/19/2023   Seizure (HCC) 05/19/2023   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   HTN (hypertension) 04/23/2023   Stroke (cerebrum) (HCC) 04/22/2023   Dizziness 04/22/2023   Intractable vomiting with nausea 08/17/2022   Abdominal pain, epigastric    Gastritis and gastroduodenitis    AKI (acute kidney injury) (HCC) 08/15/2021   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   Intractable nausea and vomiting with acute abdominal pain 10/25/2019   History of Addison's disease    Intractable cyclical vomiting with nausea    Chronic pain disorder 06/03/2017   Opioid dependence (HCC) 06/03/2017   Hypokalemia/hypomangesia 06/03/2017   Dehydration 06/03/2017   Generalized abdominal pain    Marijuana use    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  Unfortunately due to scheduling and pt illness, pt only seen 5 ST sessions in approx 2 months' time.  Objective: Pt's cognition has improved in the last ~two months. Pt also mentioned frustration with anterior labial leakage so SLP took one session to provide oral motor HEP  for her. Her cognition was not formally assessed due to limited insurance visits. SLP and pt agreed it would be best to focus on strategies for pt's deficts and treat sx, rather than take one session to formally assess cognitive linguistic skills.  Goal Update: See below.  Plan: See pt for 4 more sessions due to insurance visit limitations.  Reason Skilled Services are Required: Pt has not yet reached max potential.    ONSET DATE: 04/22/23   REFERRING DIAG: P36.97 (ICD-10-CM) - Cerebrovascular accident (CVA) due  to thrombosis of basilar artery (HCC)  THERAPY DIAG:  Dysarthria and anarthria  Aphasia  Cognitive communication deficit  Rationale for Evaluation and Treatment: Rehabilitation  SUBJECTIVE:   SUBJECTIVE STATEMENT: I know it's helping me to write down the word I forget and then do some sentences. It's easier next time I say it.  Pt accompanied by: self  PERTINENT HISTORY: presented to ED 04/22/23 with right sided weakness, facial numbness and asymmetry. 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 PMHx Addison's disease, brain tumor (benign), Hypertension, Marijuana use, opiod dependence - methadone , Pancreatitis, PCOS (polycystic ovarian syndrome), Pituitary tumor, and Seizures.   PAIN:  Are you having pain? Yes: NPRS scale: 4/10 Pain location: rt shoulder Pain description: stabbing/throbbing Aggravating factors: movement raising hand Relieving factors: heat  FALLS: Has patient fallen in last 6 months?  Yes, but none since last session  LIVING ENVIRONMENT: Lives with: lives with an adult companion Lives in: House/apartment  PLOF:  Level of assistance: Independent with ADLs, Independent with IADLs Employment: On disability (Addison's Syndrome)  PATIENT GOALS: I want my memory to be better.  OBJECTIVE:  Note: Objective measures were completed at Evaluation unless otherwise noted.  DIAGNOSTIC FINDINGS:  MR 04/22/23 IMPRESSION: Acute left pontine infarct.   PATIENT REPORTED OUTCOME MEASURES (PROM): Cognitive Function: provided 08/19/23, which pt filled out in lobby and returned. She scored herself 78/140, with lower scores indicating more of an affect of pt's deficits on QOL.Pt scored herself 1 meaning very often with having trouble remembering new information like simple instructions, difficulty doing more than one thing at a time, and instances of anomia. 2 (often, or a lot) was scored with difficulty managing time to do daily activities, trouble remembering a  list of 4-5 items without writing it down, trouble remembering things I am supposed to do (taking meds or buying something that was necessary), and had more difficulty thinking clearly.                                                                                                                            TREATMENT DATE:  Semantic Feature Analysis=SFA  09/23/23: Pt completed oral motor HEP with mod I; decr'd strength witnessed with reps 7-10 on bilabial syllable exercise, and reps 9 and 10 with lingual artic (/t/ and /k/) exercises. Pt with difficulty with straw manipulation due to tongue ring. SLP suggested trying something heavier such as licorice stick - pt also suggested  popsicle stick which SLP told pt to try as well. SLP repeated memory strategies with pt today and discussed in more detail. Pt states she uses a calendar, a schedule for the week, and med Dance movement psychotherapist. Brigid said she did her med Dance movement psychotherapist independently this last week - and had her friend double check her to verify she was correct. She would like to set alarms to be more consistent with med time. Pt thanked SLP for reviewing memory strategies with her.  09/16/23: SLP to again review memory strategies next session.  Pt out with strep throat last week. Continues to write down the words she experiences with anomia, and making 3-5 sentences with those words to encourage semantic relationships. Her anomia is occurring less than it was for last session - now occurring approx once every 3 days. SLP introduced SFA today with pt and educated her how to complete this task. She will use her 10 sheets and then will cease using SFA. Pt using dysarthria HEP every day at home and finding she does not notice anterior labial leakage > every third day. Completed exercises today with modified independence.  SLP educated pt on energy conservation (due to anomia largely coming after 3pm), and also on memory strategies. She told SLP she wrote down a  code that she would need to use later because she thought she might forget it.   08/19/23: Pt's S statement explained; She said Knife said instead of gun, and pt could not generate the word oatmeal or the word parking garage. SLP educated pt about anomia compensations completed today. See pt instructions. Pt had more questions about circumlocution which SLP answered to her satisfaction. That was very helpful, because now I know other ways to get my point across. SLP and pt reviewed pt's oral motor HEP - she req'd min-mod A, occasionally. By session end she had HEP procedure correct.  08/14/23: Needs PROM next session. Pt arrives today troubled about her drooling from her rt labial margin and would like some exercises to work on this. SLP introduced oral motor exercises (see pt instructions). Pt req'd usual mod A for proper procedure. Pt did not begin to track anomia as she forgot. SLP cued pt for what she could do to make this occur. - her suggestion to text [name] and ask him. SLP suggested she do something herself and she set reminder to put paper on frig or make note in her phone for anomia. SLP suggested [name] be a backup for her only when she forgets. Pt thanked SLP for session today.  07/25/23: SLP shared role of SLP in ST rehab for cognition and for aphasia. SLP ascertained pt was satisfied with her speech currently and would prefer to work on cognition and language. SLP shared general goals with pt and she was amenable to these. SLP talked with pt about some compensations to attempt at home such as using her phone for reminders and alarms to assist in recall. Lastly, SLP told pt to track her incidents of anomia and to share these with SLP.  PATIENT EDUCATION: Education details: see treatment date above Person educated: Patient Education method: Explanation, Demonstration, Verbal cues, and Handouts Education comprehension: verbalized understanding, returned demonstration, verbal  cues required, and needs further education   GOALS: Goals reviewed with patient? Yes  SHORT TERM GOALS: Target date:  09/27/23 (due to visit #)  Pt will tell SLP three trained anomia strategies Baseline: Goal status: met  2.  Pt will use trained one memory compensation successfully  between 3 sessions Baseline: 09/16/23, 09/23/23,  Goal status: partially met  3.  Pt will demo WFL alternating attention between two simple tasks in 3 sessions with mod I Baseline:  Goal status: deferred due to limited visits  4.  Pt will bring planner to at least 5 sessions and demo [facility] appointment tracking in 4/5 of those sessions Baseline:  Goal status: not met due to pt's calendar at home being functional  5.  Pt will complete oral motor HEP (for decr frequency of drooling) with mod I in 2 sessions Baseline: 09/23/23 Goal status: partially met  6.  Pt will complete CLQT or FAVRES Baseline:  Goal status: deferred - given pt's limited sessions for therapy   LONG TERM GOALS: Target date: 11/15/23 due to pt's schedule challenges  Pt will improve PROM compared to iniital administration Baseline:  Goal status: ongoing  2.  Pt will use/report using trained anomia strategies successfully in/between 3 sessions Baseline:  Goal status: ongoing  3.  Pt will use/report use of at least two trained memory compensations successfully in/between 3 sessions Baseline:  Goal status: ongoing  4.  Pt will pay at least 3 bills successfully using compensations Baseline:  Goal status: ongoing  5.  Pt will demo WFL alternating attention in two min-mod complex tasks in 3 sessions Baseline:  Goal status: ongoing  6.  Pt will use compensations for anomia successfully in 15 minutes mod complex conversation in 3 sessions. Baseline:  Goal status: ongoing   ASSESSMENT:  CLINICAL IMPRESSION: RECERT TODAY. End date of cert pushed out due to pt's scheduling challenges. Pt's STGs were checked today even though pt  only with 4 ST sessions in two months due to illness. Some were deferred due to limited visits. SLP will focus on therapeutic strategies and will treat pt's sx, rather than taking sessions for formal cognitive evaluation due to limited visits. Patient is a 43 y.o. F who was seen today for treatment of speech and for cognitive communication following pontine CVA in January 2025. See treatment date above for today's date for further details on today's session. Pt endorses some occasional aphasia (anomia) approx every other day. Pt stated today she has noticed a decr in frequency of drooling as she has been faithful in completing her oral motor exercises At this time pt is satisfied with her speech which is mildly dysarthric but 100% intelligible.   OBJECTIVE IMPAIRMENTS: include attention, memory, aphasia, and dysarthria. These impairments are limiting patient from managing medications, managing appointments, managing finances, household responsibilities, ADLs/IADLs, and effectively communicating at home and in community. Factors affecting potential to achieve goals and functional outcome are ability to learn/carryover information and severity of impairments. Patient will benefit from skilled SLP services to address above impairments and improve overall function. Pt may require an assessment of aphasia or of word finding during this therapy course.  REHAB POTENTIAL: Good  PLAN:  SLP FREQUENCY: 1x/week  SLP DURATION: 9 sessions  PLANNED INTERVENTIONS: Language facilitation, Environmental controls, Cueing hierachy, Cognitive reorganization, Internal/external aids, Oral motor exercises, Functional tasks, Multimodal communication approach, SLP instruction and feedback, Compensatory strategies, Patient/family education, and [id number] Treatment of speech (30 or 45 min)     Geordan Xu, CCC-SLP 09/23/2023, 5:16 PM

## 2023-10-01 ENCOUNTER — Other Ambulatory Visit (HOSPITAL_COMMUNITY): Payer: Self-pay

## 2023-10-02 ENCOUNTER — Ambulatory Visit: Admitting: Speech Pathology

## 2023-10-07 ENCOUNTER — Telehealth: Payer: Self-pay | Admitting: Gastroenterology

## 2023-10-07 NOTE — Telephone Encounter (Signed)
 Good morning Dr. San,   We received a referral for patient to be seen for difficulty swallowing and vomiting. Patient last saw [facility] in April. Patient states she feels that they did not help with her symptoms and she went back to her primary care provider and was referral back over to our office. Patient was seen with you at [facility] in 2023. Patient's previous records are in Epic for you to review and advise on scheduling.  Thank you.

## 2023-10-07 NOTE — Telephone Encounter (Signed)
 Patient was advised of recommendation below.

## 2023-10-08 ENCOUNTER — Ambulatory Visit: Admitting: Physical Therapy

## 2023-10-10 ENCOUNTER — Other Ambulatory Visit (HOSPITAL_COMMUNITY): Payer: Self-pay

## 2023-10-16 ENCOUNTER — Ambulatory Visit: Admitting: Occupational Therapy

## 2023-10-16 ENCOUNTER — Ambulatory Visit: Admitting: Physical Therapy

## 2023-10-22 ENCOUNTER — Ambulatory Visit: Admitting: Physical Therapy

## 2023-10-22 ENCOUNTER — Ambulatory Visit: Attending: Physical Medicine & Rehabilitation | Admitting: Speech Pathology

## 2023-10-22 ENCOUNTER — Encounter: Payer: Self-pay | Admitting: Speech Pathology

## 2023-10-22 ENCOUNTER — Encounter: Payer: Self-pay | Admitting: Physical Therapy

## 2023-10-22 DIAGNOSIS — R471 Dysarthria and anarthria: Secondary | ICD-10-CM | POA: Insufficient documentation

## 2023-10-22 DIAGNOSIS — I69351 Hemiplegia and hemiparesis following cerebral infarction affecting right dominant side: Secondary | ICD-10-CM

## 2023-10-22 DIAGNOSIS — R41841 Cognitive communication deficit: Secondary | ICD-10-CM | POA: Diagnosis present

## 2023-10-22 DIAGNOSIS — R208 Other disturbances of skin sensation: Secondary | ICD-10-CM

## 2023-10-22 DIAGNOSIS — R4701 Aphasia: Secondary | ICD-10-CM | POA: Insufficient documentation

## 2023-10-22 DIAGNOSIS — R278 Other lack of coordination: Secondary | ICD-10-CM | POA: Insufficient documentation

## 2023-10-22 DIAGNOSIS — M6281 Muscle weakness (generalized): Secondary | ICD-10-CM | POA: Insufficient documentation

## 2023-10-22 DIAGNOSIS — R2681 Unsteadiness on feet: Secondary | ICD-10-CM | POA: Diagnosis present

## 2023-10-22 DIAGNOSIS — R262 Difficulty in walking, not elsewhere classified: Secondary | ICD-10-CM | POA: Insufficient documentation

## 2023-10-22 DIAGNOSIS — R2689 Other abnormalities of gait and mobility: Secondary | ICD-10-CM

## 2023-10-22 NOTE — Therapy (Signed)
 OUTPATIENT PHYSICAL THERAPY NEURO TREATMENT   Patient Name: Brittany Mullins MRN: [id number] DOB:03/07/1981, 43 y.o., female Today's Date: 10/22/2023   PCP: Alec House, MD REFERRING PROVIDER: Carilyn Prentice BRAVO, MD  END OF SESSION:  PT End of Session - 10/22/23 1009     Visit Number 3    Number of Visits 12    Date for PT Re-Evaluation 11/11/23    Authorization Type Medicare/Medicaiad    PT Start Time 1015    PT Stop Time 1055    PT Time Calculation (min) 40 min    Equipment Utilized During Treatment Gait belt    Activity Tolerance Patient tolerated treatment well    Behavior During Therapy WFL for tasks assessed/performed            Past Medical History:  Diagnosis Date   Addison's disease (HCC)    Brain tumor (benign) (HCC)    Chronic pancreatitis (HCC)    Hearing loss    Hypertension    Marijuana use    Per pt: medical marijuana patient   Pancreatitis    PCOS (polycystic ovarian syndrome)    Pituitary tumor    Seizures (HCC)    Past Surgical History:  Procedure Laterality Date   ABDOMINAL HYSTERECTOMY     APPENDECTOMY     BIOPSY  08/16/2021   Procedure: BIOPSY;  Surgeon: San Sandor GAILS, DO;  Location: [facility];  Service: Gastroenterology;;   CESAREAN SECTION     CHOLECYSTECTOMY     ELBOW SURGERY     ESOPHAGOGASTRODUODENOSCOPY (EGD) WITH PROPOFOL  N/A 08/16/2021   Procedure: ESOPHAGOGASTRODUODENOSCOPY (EGD) WITH PROPOFOL ;  Surgeon: San Sandor GAILS, DO;  Location: [facility];  Service: Gastroenterology;  Laterality: N/A;   kidney stent     pancreatic stent     RADIOLOGY WITH ANESTHESIA N/A 04/25/2023   Procedure: MRI of Brain without contrast;  Surgeon: Radiologist, Medication, MD;  Location: [facility];  Service: Radiology;  Laterality: N/A;   RADIOLOGY WITH ANESTHESIA N/A 05/03/2023   Procedure: MRI WITH ANESTHESIA CERVICAL SPINE WITHOUT CONTRAST;  Surgeon: Radiologist, Medication, MD;  Location: [facility];  Service: Radiology;   Laterality: N/A;   Patient Active Problem List   Diagnosis Date Noted   Superficial thrombophlebitis 06/14/2023   Low protein S 05/19/2023   Sepsis (HCC) 05/19/2023   Blurry vision, left eye 05/19/2023   Nephrolithiasis 05/19/2023   Dysphagia 05/19/2023   Seizure (HCC) 05/19/2023   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   HTN (hypertension) 04/23/2023   Stroke (cerebrum) (HCC) 04/22/2023   Dizziness 04/22/2023   Intractable vomiting with nausea 08/17/2022   Abdominal pain, epigastric    Gastritis and gastroduodenitis    AKI (acute kidney injury) (HCC) 08/15/2021   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   Intractable nausea and vomiting with acute abdominal pain 10/25/2019   History of Addison's disease    Intractable cyclical vomiting with nausea    Chronic pain disorder 06/03/2017   Opioid dependence (HCC) 06/03/2017   Hypokalemia/hypomangesia 06/03/2017   Dehydration 06/03/2017   Generalized abdominal pain    Marijuana use    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   Bipolar I disorder with mixed features (HCC) 07/07/2012    ONSET DATE: 04/22/23  REFERRING DIAG:  P36.97 (ICD-10-CM) - Cerebrovascular accident (CVA) due to thrombosis of basilar artery (HCC)    THERAPY DIAG:  Difficulty in walking, not elsewhere classified  Muscle weakness (generalized)  Other abnormalities of gait and mobility  Unsteadiness on feet  Hemiplegia and hemiparesis following cerebral infarction affecting  right dominant side (HCC)  Other disturbances of skin sensation  Other lack of coordination  Rationale for Evaluation and Treatment: Rehabilitation  SUBJECTIVE:                                                                                                                                                                                             SUBJECTIVE STATEMENT: Pt states she's been helping with her son who has cancer. Has been staying active -- has been swimming. Now able to  walk without RW and just R AFO. Reports some continued  Pt accompanied by: self  PERTINENT HISTORY: history of Addison's disease, hypertension, chronic pancreatitis, chronic pain on Methadone , chronic nausea, history of prolactinoma, bipolar disorder, left pontine stroke in 04/2023, protein S deficiency, and seizuresadmitted on 04/22/2023 for right-sided numbness and abnormal gait with left calf pain. MRI brain showed an acute paramedian left pontine infarct.  PAIN:  Are you having pain? Yes: NPRS scale: 4-5/10 Pain location: R ankle Pain description: aching Aggravating factors: ever since the stroke Relieving factors: heat  PRECAUTIONS: Fall  RED FLAGS: None   WEIGHT BEARING RESTRICTIONS: No  FALLS: Has patient fallen in last 6 months? Yes. Number of falls 5 no injuries other than skin tear  LIVING ENVIRONMENT: Lives with: lives with an adult companion Lives in: House/apartment, no stairs   PLOF:  Level of assistance: Independent with ADLs, Independent with IADLs Employment: On disability (Addison's Syndrome)    PATIENT GOALS: walk without AD  OBJECTIVE:      TODAY'S TREATMENT: 10/22/2023  Activity Comments  Nustep L6 LEs only x 6 min   In Prone: Cobra x10 Quad stretch x30 Hamstring curl 2x10    Tighter on R quad  5x STS: 9.13 sec   Stair ascent/descent reciprocal pattern x 2 laps   In // bars: Tandem stance with R LE back 2x30 Squats x20 Slow march x20   Cues to keep R hip fwd and knee straight  Cues to keep R hip and knee extended   Rechecked DGI With mirror; still with tendency to shift L slightly upon extending R knee         HOME EXERCISE PROGRAM Access Code: [email or web address] URL: [email or web address] Date: 10/22/2023 Prepared by: Miski Feldpausch April Earnie Starring  Exercises - Forward and Backward Stepping at [employer]  - 1 x daily - 7 x weekly - 2 sets - 10 reps - Standing Lumbar Extension with Counter  - 1 x daily - 7 x weekly - 1 sets -  10 reps - Tandem Stance (Mirrored)  - 1 x daily - 7 x weekly - 2 sets -  30 sec hold - Standing March at [facility]  - 1 x daily - 7 x weekly - 2 sets - 10 reps - Mini Squat with Counter Support  - 1 x daily - 7 x weekly - 2 sets - 10 reps - Walking Butt Kicks  - 1 x daily - 7 x weekly - 2 sets - 10 reps   PATIENT EDUCATION: Education details: HEP updates Person educated: Patient Education method: Explanation, Demonstration, Tactile cues, Verbal cues, and Handouts Education comprehension: verbalized understanding and returned demonstration     Note: Objective measures were completed at Evaluation unless otherwise noted.  COORDINATION: Difficulty with rapid toe tap RLE Heel to shin impaired w/ RLE  POSTURE: rounded shoulders and forward head  LOWER EXTREMITY ROM:     Active  Right Eval Left Eval  Hip flexion    Hip extension    Hip abduction    Hip adduction    Hip internal rotation    Hip external rotation    Knee flexion    Knee extension    Ankle dorsiflexion 5 15  Ankle plantarflexion    Ankle inversion    Ankle eversion     (Blank rows = not tested)  LOWER EXTREMITY MMT:    MMT Right Eval Left Eval  Hip flexion    Hip extension    Hip abduction 3+ 5  Hip adduction    Hip internal rotation    Hip external rotation    Knee flexion 4 5  Knee extension 3+ 5  Ankle dorsiflexion 3- 5  Ankle plantarflexion 2+ 3  Ankle inversion    Ankle eversion    (Blank rows = not tested)   CURB:  Findings: SBA-CGA  STAIRS: Findings: Level of Assistance: CGA, Number of Stairs: 6, Height of Stairs: 4-6   , and Comments: BHR and step-to GAIT: Findings: Gait Characteristics: decreased stance time- Right, decreased stride length, and poor foot clearance- Right, Distance walked:  , and Comments: reports endurance for about 6-10 minutes of walking w/ 4WW Level surfaces w/out AD and CGA x 30 ft  FUNCTIONAL TESTS:  Eval 09/02/23:  5 times sit to stand: 15 Timed up and go  (TUG):  10 Meter Walk Test: 25.34 sec = 1.29 ft sec Dynamic Gait Index: 8/24  [facility] PT Assessment - 10/22/23 0001       Dynamic Gait Index   Level Surface Mild Impairment    Change in Gait Speed Normal    Gait with Horizontal Head Turns Normal    Gait with Vertical Head Turns Normal    Gait and Pivot Turn Normal    Step Over Obstacle Mild Impairment    Step Around Obstacles Normal    Steps Normal    Total Score 22         10/22/23: Dynamic Gait Index: 22/24  [facility] PT Assessment - 10/22/23 0001       Dynamic Gait Index   Level Surface Mild Impairment    Change in Gait Speed Normal    Gait with Horizontal Head Turns Normal    Gait with Vertical Head Turns Normal    Gait and Pivot Turn Normal    Step Over Obstacle Mild Impairment    Step Around Obstacles Normal    Steps Normal    Total Score 22           Berg Balance Scale:  Item Test date: 09/02/23 Test date:  Test date:   Sitting to standing 4.  able to stand without using hands and stabilize independently 4. able to stand without using hands and stabilize independently  4. able to stand safely for 2 minutes  4. able to sit safely and securely for 2 minutes   4. sits safely with minimal use of hands  4. able to transfer safely with minor use of hands  4. able to stand 10 seconds safely  4. able to place feet together independently and stand 1 minute safely  4. can reach forward confidently 25 cm (10 inches)  4. able to pick up slipper safely and easily  4. looks behind from both sides and weight shifts well  4. able to turn 360 degrees safely in 4 seconds or less  4. ble to stand independently and safely and complete 8 steps in 20 seconds  3. able to place foot ahead independently and hold 30 seconds  4. able to lift leg independently and hold > 10 seconds    Insert OPRCBERGREEVAL SmartPhrase at re-test date  2. Standing unsupported 4. able to stand safely for 2 minutes    3. Sitting with back unsupported, feet  supported 4. able to sit safely and securely for 2 minutes    4. Standing to sitting 4. sits safely with minimal use of hands    5. Pivot transfer  4. able to transfer safely with minor use of hands    6. Standing unsupported with eyes closed 3. able to stand 10 seconds with supervision    7. Standing unsupported with feet together 3. able to place feet together independently and stand 1 minute with supervision    8. Reaching forward with outstretched arms while standing 3. can reach forward 12 cm (5 inches)    9. Pick up object from the floor from standing 4. able to pick up slipper safely and easily    10. Turning to look behind over left and right shoulders while standing 3. looks behind one side only, other side shows less weight shift    11. Turn 360 degrees 3. able to turn 360 degrees safely, one side only, in 4 seconds or less    12. Place alternate foot on step or stool while standing unsupported 2. able to complete 4 steps without aid with supervision     13. Standing unsupported one foot in front 2. able to take small step independently and hold 30 seconds    14. Standing on one leg 3. able to lift leg independently and hold 5-10 seconds     Total Score 46/56 Total Score 55/56 Total Score /56       GOALS: Goals reviewed with patient? Yes  SHORT TERM GOALS: Target date: 09/30/2023    Patient will be independent in HEP to improve functional outcomes Baseline: Goal status: IN PROGRESS  2.  Demo improved standing balance and reduced risk for falls per score 52/56 Berg Balance Test for greater safety during ADL Baseline: 46/56 10/22/23: 55/56 Goal status: MET  3.  Ambulation level surfaces w/out AD and supervision x 100 ft for improved household safety/ambulation Baseline: CGA x 30 ft 10/22/23: Has been walking in grocery store without cane; will use cane for very far walks Goal status: MET    LONG TERM GOALS: Target date: 11/11/2023    Demo improved balance and reduce  risk for falls per score 17/24 Dynamic Gait Index Baseline: 8/24 10/22/23: 22/24 Goal status: MET  2.  Report/demo improved ambulation endurance per report of 20 min continuous walking to  improve community outings/endurance Baseline: reports 5-10 min limit 10/22/23: Able to walk from the parking lot and around the hospital/cancer center without any reported issues Goal status: IN PROGRESS  3.  Demo modified independence with cane over various surfaces to improve efficiency for community ambulation Baseline: 4WW 10/22/23: Has been ambulating in the community without a/d except for longer distances such as going to the cancer center with her son Goal status: IN PROGRESS  4.  Pt to demo/report independence with household ambulation (no AD) x 100 ft Baseline: CGA x 30 ft 10/22/23: Ambulating around grocery store per pt report without a/d Goal status: MET    ASSESSMENT:  CLINICAL IMPRESSION:  Patient arrived to session with R AFO donned. States she has been using it and it has been helpful. Despite not seeing PT since June, she is demonstrating continued gains and is now able to amb without cane for most community distances. Provided pt more exercises to work on in the water. Still remains most tight and weak in her R quad and hip flexor and she still maintains slight R knee and hip flexion with most of her weight on L LE in standing. Encouraged pt to keep working on straightening out her R LE with standing and maintaining equal weight shift. Pt's Berg Balance score and DGI have both greatly improved and has met her LTGs. Pt is close to PT d/c status and may benefit from just a few more sessions to finalize an HEP.   OBJECTIVE IMPAIRMENTS: Abnormal gait, decreased activity tolerance, decreased balance, decreased coordination, decreased endurance, decreased knowledge of use of DME, difficulty walking, decreased ROM, decreased strength, dizziness, impaired flexibility, improper body mechanics, and  pain.   ACTIVITY LIMITATIONS: carrying, lifting, bending, squatting, stairs, reach over head, and locomotion level  PARTICIPATION LIMITATIONS: meal prep, cleaning, interpersonal relationship, shopping, and community activity  PERSONAL FACTORS: Age, Time since onset of injury/illness/exacerbation, and 1-2 comorbidities: PMH are also affecting patient's functional outcome.   REHAB POTENTIAL: Good  CLINICAL DECISION MAKING: Evolving/moderate complexity  EVALUATION COMPLEXITY: Moderate  PLAN:  PT FREQUENCY: 1-2x/week  PT DURATION: 10 weeks  PLANNED INTERVENTIONS: 97750- Physical Performance Testing, 97110-Therapeutic exercises, 97530- Therapeutic activity, [id number]- Neuromuscular re-education, 97535- Self Care, [id number]- Manual therapy, [phone]- Gait training, [phone]- Orthotic Initial, [phone]- Orthotic/Prosthetic subsequent, and [phone]- Aquatic Therapy  PLAN FOR NEXT SESSION: HEP review and progression for strength, gait training with R AFO on unlevel surfaces, R knee/hip extension and stretching, R LE weight shift   Kenzie Flakes April Ma L Dilworth, PT, DPT 10/22/23 11:02 AM  [facility] at [facility] [address] [place], [place] [address] Phone # [phone] Fax # [phone]

## 2023-10-22 NOTE — Therapy (Signed)
 OUTPATIENT SPEECH LANGUAGE PATHOLOGY TREATMENT/RECERTIFICATION   Patient Name: Brittany Mullins MRN: [id number] DOB:October 01, 1980, 43 y.o., female Today's Date: 10/22/2023  PCP: Alec House, MD REFERRING PROVIDER: Carilyn Prentice BRAVO, MD  END OF SESSION:  End of Session - 10/22/23 0935     Visit Number 6    Number of Visits 17    Date for SLP Re-Evaluation 11/15/23   due to scheduling challenges   SLP Start Time 0934    SLP Stop Time  1015    SLP Time Calculation (min) 41 min    Activity Tolerance Patient tolerated treatment well           Past Medical History:  Diagnosis Date   Addison's disease (HCC)    Brain tumor (benign) (HCC)    Chronic pancreatitis (HCC)    Hearing loss    Hypertension    Marijuana use    Per pt: medical marijuana patient   Pancreatitis    PCOS (polycystic ovarian syndrome)    Pituitary tumor    Seizures (HCC)    Past Surgical History:  Procedure Laterality Date   ABDOMINAL HYSTERECTOMY     APPENDECTOMY     BIOPSY  08/16/2021   Procedure: BIOPSY;  Surgeon: San Sandor GAILS, DO;  Location: [facility];  Service: Gastroenterology;;   CESAREAN SECTION     CHOLECYSTECTOMY     ELBOW SURGERY     ESOPHAGOGASTRODUODENOSCOPY (EGD) WITH PROPOFOL  N/A 08/16/2021   Procedure: ESOPHAGOGASTRODUODENOSCOPY (EGD) WITH PROPOFOL ;  Surgeon: San Sandor GAILS, DO;  Location: [facility];  Service: Gastroenterology;  Laterality: N/A;   kidney stent     pancreatic stent     RADIOLOGY WITH ANESTHESIA N/A 04/25/2023   Procedure: MRI of Brain without contrast;  Surgeon: Radiologist, Medication, MD;  Location: [facility] OR;  Service: Radiology;  Laterality: N/A;   RADIOLOGY WITH ANESTHESIA N/A 05/03/2023   Procedure: MRI WITH ANESTHESIA CERVICAL SPINE WITHOUT CONTRAST;  Surgeon: Radiologist, Medication, MD;  Location: [facility];  Service: Radiology;  Laterality: N/A;   Patient Active Problem List   Diagnosis Date Noted   Superficial thrombophlebitis 06/14/2023    Low protein S 05/19/2023   Sepsis (HCC) 05/19/2023   Blurry vision, left eye 05/19/2023   Nephrolithiasis 05/19/2023   Dysphagia 05/19/2023   Seizure (HCC) 05/19/2023   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   HTN (hypertension) 04/23/2023   Stroke (cerebrum) (HCC) 04/22/2023   Dizziness 04/22/2023   Intractable vomiting with nausea 08/17/2022   Abdominal pain, epigastric    Gastritis and gastroduodenitis    AKI (acute kidney injury) (HCC) 08/15/2021   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   Intractable nausea and vomiting with acute abdominal pain 10/25/2019   History of Addison's disease    Intractable cyclical vomiting with nausea    Chronic pain disorder 06/03/2017   Opioid dependence (HCC) 06/03/2017   Hypokalemia/hypomangesia 06/03/2017   Dehydration 06/03/2017   Generalized abdominal pain    Marijuana use    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  Unfortunately due to scheduling and pt illness, pt only seen 5 ST sessions in approx 2 months' time.  Objective: Pt's cognition has improved in the last ~two months. Pt also mentioned frustration with anterior labial leakage so SLP took one session to provide oral motor HEP  for her. Her cognition was not formally assessed due to limited insurance visits. SLP and pt agreed it would be best to focus on strategies for pt's deficts and treat sx, rather than take one session to formally assess cognitive linguistic skills.  Goal Update: See below.  Plan: See pt for 4 more sessions due to insurance visit limitations.  Reason Skilled Services are Required: Pt has not yet reached max potential.    ONSET DATE: 04/22/23   REFERRING DIAG: P36.97 (ICD-10-CM) - Cerebrovascular accident (CVA) due  to thrombosis of basilar artery (HCC)  THERAPY DIAG:  Dysarthria and anarthria  Cognitive communication deficit  Aphasia  Rationale for Evaluation and Treatment: Rehabilitation  SUBJECTIVE:   SUBJECTIVE STATEMENT: I know it's helping me to write down the word I forget and then do some sentences. It's easier next time I say it.  Pt accompanied by: self  PERTINENT HISTORY: presented to ED 04/22/23 with right sided weakness, facial numbness and asymmetry. 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 PMHx Addison's disease, brain tumor (benign), Hypertension, Marijuana use, opiod dependence - methadone , Pancreatitis, PCOS (polycystic ovarian syndrome), Pituitary tumor, and Seizures.   PAIN:  Are you having pain? Yes: NPRS scale: 4/10 Pain location: rt shoulder Pain description: stabbing/throbbing Aggravating factors: movement raising hand Relieving factors: heat  FALLS: Has patient fallen in last 6 months?  Yes, but none since last session  LIVING ENVIRONMENT: Lives with: lives with an adult companion Lives in: House/apartment  PLOF:  Level of assistance: Independent with ADLs, Independent with IADLs Employment: On disability (Addison's Syndrome)  PATIENT GOALS: I want my memory to be better.  OBJECTIVE:  Note: Objective measures were completed at Evaluation unless otherwise noted.  DIAGNOSTIC FINDINGS:  MR 04/22/23 IMPRESSION: Acute left pontine infarct.   PATIENT REPORTED OUTCOME MEASURES (PROM): Cognitive Function: provided 08/19/23, which pt filled out in lobby and returned. She scored herself 78/140, with lower scores indicating more of an affect of pt's deficits on QOL.Pt scored herself 1 meaning very often with having trouble remembering new information like simple instructions, difficulty doing more than one thing at a time, and instances of anomia. 2 (often, or a lot) was scored with difficulty managing time to do daily activities, trouble remembering a  list of 4-5 items without writing it down, trouble remembering things I am supposed to do (taking meds or buying something that was necessary), and had more difficulty thinking clearly.                                                                                                                            TREATMENT DATE:  Semantic Feature Analysis=SFA  10/22/23: Pt was seen for skilled ST services targeting dysarthria. Pt reports her speech has improved since stroke and beginning therapy. No longer having any drooling. Pt reports she has been trying to use her notes app on her phone for memory, but is not great with technology. SLP suggested writing things down and  taking a picture of it so she has it on her phone. Reports no errors in her pillbox this last month. Pt reports difficulty with focus specifically during conversations. SLP educated on attention strategies this session. Pt reports extreme overwhelm with noise - SLP suggested trying Loop Earplugs- Engage to reduce background noise, but still allow her to be present in conversation. SLP provided personally-relevant examples of the following strategies re: managing fatigue, decreasing distractions, creating lists, and taking brain breaks. SLP-SCHINKE to continue with strategies next session (pt is to bring handout back).   09/23/23: Pt completed oral motor HEP with mod I; decr'd strength witnessed with reps 7-10 on bilabial syllable exercise, and reps 9 and 10 with lingual artic (/t/ and /k/) exercises. Pt with difficulty with straw manipulation due to tongue ring. SLP suggested trying something heavier such as licorice stick - pt also suggested popsicle stick which SLP told pt to try as well. SLP repeated memory strategies with pt today and discussed in more detail. Pt states she uses a calendar, a schedule for the week, and med Dance movement psychotherapist. Kalisa said she did her med Dance movement psychotherapist independently this last week - and had her friend double check her  to verify she was correct. She would like to set alarms to be more consistent with med time. Pt thanked SLP for reviewing memory strategies with her.  09/16/23: SLP to again review memory strategies next session.  Pt out with strep throat last week. Continues to write down the words she experiences with anomia, and making 3-5 sentences with those words to encourage semantic relationships. Her anomia is occurring less than it was for last session - now occurring approx once every 3 days. SLP introduced SFA today with pt and educated her how to complete this task. She will use her 10 sheets and then will cease using SFA. Pt using dysarthria HEP every day at home and finding she does not notice anterior labial leakage > every third day. Completed exercises today with modified independence.  SLP educated pt on energy conservation (due to anomia largely coming after 3pm), and also on memory strategies. She told SLP she wrote down a code that she would need to use later because she thought she might forget it.   08/19/23: Pt's S statement explained; She said Knife said instead of gun, and pt could not generate the word oatmeal or the word parking garage. SLP educated pt about anomia compensations completed today. See pt instructions. Pt had more questions about circumlocution which SLP answered to her satisfaction. That was very helpful, because now I know other ways to get my point across. SLP and pt reviewed pt's oral motor HEP - she req'd min-mod A, occasionally. By session end she had HEP procedure correct.  08/14/23: Needs PROM next session. Pt arrives today troubled about her drooling from her rt labial margin and would like some exercises to work on this. SLP introduced oral motor exercises (see pt instructions). Pt req'd usual mod A for proper procedure. Pt did not begin to track anomia as she forgot. SLP cued pt for what she could do to make this occur. - her suggestion to text [name]  and ask him. SLP suggested she do something herself and she set reminder to put paper on frig or make note in her phone for anomia. SLP suggested [name] be a backup for her only when she forgets. Pt thanked SLP for session today.  07/25/23: SLP shared role of SLP in ST rehab for cognition  and for aphasia. SLP ascertained pt was satisfied with her speech currently and would prefer to work on cognition and language. SLP shared general goals with pt and she was amenable to these. SLP talked with pt about some compensations to attempt at home such as using her phone for reminders and alarms to assist in recall. Lastly, SLP told pt to track her incidents of anomia and to share these with SLP.  PATIENT EDUCATION: Education details: see treatment date above Person educated: Patient Education method: Explanation, Demonstration, Verbal cues, and Handouts Education comprehension: verbalized understanding, returned demonstration, verbal cues required, and needs further education   GOALS: Goals reviewed with patient? Yes  SHORT TERM GOALS: Target date:  09/27/23 (due to visit #)  Pt will tell SLP three trained anomia strategies Baseline: Goal status: met  2.  Pt will use trained one memory compensation successfully between 3 sessions Baseline: 09/16/23, 09/23/23,  Goal status: partially met  3.  Pt will demo WFL alternating attention between two simple tasks in 3 sessions with mod I Baseline:  Goal status: deferred due to limited visits  4.  Pt will bring planner to at least 5 sessions and demo [facility] appointment tracking in 4/5 of those sessions Baseline:  Goal status: not met due to pt's calendar at home being functional  5.  Pt will complete oral motor HEP (for decr frequency of drooling) with mod I in 2 sessions Baseline: 09/23/23 Goal status: partially met  6.  Pt will complete CLQT or FAVRES Baseline:  Goal status: deferred - given pt's limited sessions for therapy   LONG TERM GOALS:  Target date: 11/15/23 due to pt's schedule challenges  Pt will improve PROM compared to iniital administration Baseline:  Goal status: ongoing  2.  Pt will use/report using trained anomia strategies successfully in/between 3 sessions Baseline:  Goal status: ongoing  3.  Pt will use/report use of at least two trained memory compensations successfully in/between 3 sessions Baseline:  Goal status: ongoing  4.  Pt will pay at least 3 bills successfully using compensations Baseline:  Goal status: ongoing  5.  Pt will demo WFL alternating attention in two min-mod complex tasks in 3 sessions Baseline:  Goal status: ongoing  6.  Pt will use compensations for anomia successfully in 15 minutes mod complex conversation in 3 sessions. Baseline:  Goal status: ongoing   ASSESSMENT:  CLINICAL IMPRESSION: RECERT TODAY. End date of cert pushed out due to pt's scheduling challenges. Pt's STGs were checked today even though pt only with 4 ST sessions in two months due to illness. Some were deferred due to limited visits. SLP will focus on therapeutic strategies and will treat pt's sx, rather than taking sessions for formal cognitive evaluation due to limited visits. Patient is a 43 y.o. F who was seen today for treatment of speech and for cognitive communication following pontine CVA in January 2025. See treatment date above for today's date for further details on today's session. Pt endorses some occasional aphasia (anomia) approx every other day. Pt stated today she has noticed a decr in frequency of drooling as she has been faithful in completing her oral motor exercises At this time pt is satisfied with her speech which is mildly dysarthric but 100% intelligible.   OBJECTIVE IMPAIRMENTS: include attention, memory, aphasia, and dysarthria. These impairments are limiting patient from managing medications, managing appointments, managing finances, household responsibilities, ADLs/IADLs, and  effectively communicating at home and in community. Factors affecting potential to achieve goals  and functional outcome are ability to learn/carryover information and severity of impairments. Patient will benefit from skilled SLP services to address above impairments and improve overall function. Pt may require an assessment of aphasia or of word finding during this therapy course.  REHAB POTENTIAL: Good  PLAN:  SLP FREQUENCY: 1x/week  SLP DURATION: 9 sessions  PLANNED INTERVENTIONS: Language facilitation, Environmental controls, Cueing hierachy, Cognitive reorganization, Internal/external aids, Oral motor exercises, Functional tasks, Multimodal communication approach, SLP instruction and feedback, Compensatory strategies, Patient/family education, and [id number] Treatment of speech (30 or 45 min)     [employer], CCC-SLP 10/22/2023, 9:36 AM

## 2023-10-23 ENCOUNTER — Encounter: Payer: Self-pay | Admitting: Neurology

## 2023-10-23 ENCOUNTER — Ambulatory Visit (INDEPENDENT_AMBULATORY_CARE_PROVIDER_SITE_OTHER): Admitting: Neurology

## 2023-10-23 VITALS — BP 138/82 | HR 100 | Ht 63.0 in | Wt 208.0 lb

## 2023-10-23 DIAGNOSIS — F445 Conversion disorder with seizures or convulsions: Secondary | ICD-10-CM

## 2023-10-23 DIAGNOSIS — I639 Cerebral infarction, unspecified: Secondary | ICD-10-CM | POA: Diagnosis not present

## 2023-10-23 MED ORDER — LAMOTRIGINE 25 MG PO TABS: 25.0000 mg | ORAL_TABLET | Freq: Two times a day (BID) | ORAL | 3 refills | Status: AC

## 2023-10-23 MED ORDER — LEVETIRACETAM 500 MG PO TABS: 500.0000 mg | ORAL_TABLET | Freq: Three times a day (TID) | ORAL | 3 refills | Status: AC

## 2023-10-23 NOTE — Progress Notes (Signed)
 NEUROLOGY FOLLOW UP OFFICE NOTE  Brittany Mullins [id number] Feb 22, 1981  HISTORY OF PRESENT ILLNESS: I had the pleasure of seeing Brittany Mullins in follow-up in the neurology clinic on 10/23/2023.  The patient was last seen 3 months ago. She is accompanied by her fiance Brittany Mullins who help supplement the history today.  Records and images were personally reviewed where available.  She had a left pontine stroke in 04/2023. During her admission, she had staring episodes followed by stiffness of left arm captured on EEG with no EEG correlate seen. She had been on Levetiracetam  500mg  TID and Lamotrigine  25mg  BID and was continued on same dose. On her initial visit, she reported recurrence of episodes when she ran out of Abilify . She was in the ER on 4/30 and Abilify  was refilled. She denies any further seizures since then. Brittany Mullins denies any staring/unresponsive episodes. She is in a good mood and reports doing well, motivated to continue to improve. She is working really hard and now ambulates without any assistive devices. Brittany Mullins reports she only needs a cane after she had a long day. She is not wearing her AFO today. She denies any headaches. She fell and hit her head 2 months ago trying to get up and feeling dizzy, no loss of consciousness. No further dizziness. She feels the numbness and tingling is getting better on her right arm and fingers. She goes to the pool. Sleep is good. She manages her own medications, Brittany Mullins checks behind her.    History on Initial Assessment 07/30/2023: This is a 43 year old right-handed woman with a history of Addison's disease, hypertension, chronic pancreatitis, chronic pain on Methadone , chronic nausea, history of prolactinoma, bipolar disorder, left pontine stroke in 04/2023, protein S deficiency, and seizures, presenting to establish neurological care. She was admitted on 04/22/2023 for right-sided numbness and abnormal gait with left calf pain. MRI brain showed an  acute paramedian left pontine infarct. She was noted to have slurred speech with intermittent aphasia and was started on DAPT for stroke felt secondary to small vessel disease. During her admission, she had staring episodes followed by stiffness of the left upper extremity. She had been on Levetiracetam  since 2023 and dose was increased to 500mg  TID. She continued to have recurrent spells and underwent prolonged EEG where she had episodes of eyes open, unresponsive, feeling funny, subtle nonrhythmic movements in the left upper extremity with no associated EEG changes seen. While in inpatient rehab, OT reported left hemianopsia however neurological exam showed inconsistent responses. She had another episode of unresponsiveness on 2/18 while in rehab with eyes closed, repeat EEG was normal. She continues on Levetiracetam  500mg  TID without side effects. She denies any prior history of seizure, however on review of records, there is a hospital visit in 2015 for seizure. She reports seizures started at age 64 (a year ago) while in the middle of intercourse she felt funny and was told she was shaking and unresponsive. She was initially having them once a week, started on Lamotrigine  which did not really help. She saw a provider at [facility] and reports Lamotrigine  was increased to 25mg  BID but still not helping. EPIC notes show an ER visit in 03/2022 for seizures. She reports the seizures that occurred after the stroke were similar but addition of Levetiracetam  seemed to help. She had been seizure-free for 3 weeks until her birthday 3 days ago when she started shaking in the car, eyes open, staring off. She states usually when she has  a seizure, her eyes are closed. Her boyfriend has mentioned witnessing a nocturnal seizure that he alerted her on when she woke up. No tongue bite or incontinence. She takes an additional dose of Lamotrigine  which helps. She denies any olfactory/gustatory hallucinations, deja  vu, rising epigastric sensation, focal weakness, myoclonic jerks.While in rehab, she was having pain on the right leg and shoulder and started on Gabapentin  300-300-400mg  with no side effects. She continues to do PT, OT, speech therapy.  She reports being diagnosed with Addison's disease/pituitary tumor at [facility] in 2009, taking Hydrocortisone  and Prednisone . MRI brain from [facility] in 2010 reported partial empty sella, no adenoma identified. She had a brain MRI with and without contrast 04/25/23 with pituitary protocol, again with note of partially empty sella turcica, no evidence of pituitary mass.   She reports headaches around once a week with throbbing on both temples. She does not take Tylenol  much for them. Headache trigger is when she is overwhelmed. She reports dizziness with her balance really off. She has right hand numbness and does not fell hot or cold with it. She has neck pain coming from her shoulder. She reports incontinence since the stroke, she cannot get to the bathroom fast enough. She is supposed to take Abilify  but ran out of refills. Mood has been up and down since stopping Abilify . She notes the seizures recurred after being off Abilify . She has been really sleepy since the stroke. She reports her daughter-in-law fills her pillbox and she takes it herself but occasionally forgets.   She reports falling into a ditch hitting her head with loss of consciousness, no neurosurgical procedures. She had a normal birth and early development.  There is no history of febrile convulsions, CNS infections such as meningitis/encephalitis, significant traumatic brain injury, neurosurgical procedures, or family history of seizures.   PAST MEDICAL HISTORY: Past Medical History:  Diagnosis Date   Addison's disease (HCC)    Brain tumor (benign) (HCC)    Chronic pancreatitis (HCC)    Hearing loss    Hypertension    Marijuana use    Per pt: medical marijuana patient   Pancreatitis    PCOS (polycystic  ovarian syndrome)    Pituitary tumor    Seizures (HCC)     MEDICATIONS: Current Outpatient Medications on File Prior to Visit  Medication Sig Dispense Refill   acetaminophen  (TYLENOL ) 325 MG tablet Take 2 tablets (650 mg total) by mouth every 6 (six) hours as needed for mild pain (pain score 1-3). 20 tablet 0   amLODipine  (NORVASC ) 5 MG tablet Take 1 tablet (5 mg total) by mouth daily. 90 tablet 1   ARIPiprazole  (ABILIFY ) 5 MG tablet Take 1 tablet (5 mg total) by mouth daily. 14 tablet 0   aspirin  EC 81 MG tablet Take 1 tablet (81 mg total) by mouth daily. Swallow whole. 100 tablet 0   atorvastatin  (LIPITOR) 40 MG tablet Take 1 tablet (40 mg total) by mouth daily. 30 tablet 0   baclofen (LIORESAL) 20 MG tablet Take 20 mg by mouth 2 (two) times daily as needed.     capsicum (ZOSTRIX) 0.075 % topical cream Apply topically 2 (two) times daily. Apply to shoulders--avoid eyes and broken skin 28.3 g 0   cyclobenzaprine  (FLEXERIL ) 5 MG tablet Take 1 tablet (5 mg total) by mouth 3 (three) times daily as needed for muscle spasms. 30 tablet 0   furosemide (LASIX) 20 MG tablet Take 20 mg by mouth daily.     gabapentin  (NEURONTIN )  300 MG capsule Take 1 capsule (300 mg total) by mouth 2 (two) times daily with breakfast and with lunch 60 capsule 0   gabapentin  (NEURONTIN ) 400 MG capsule Take 1 capsule (400 mg total) by mouth at bedtime. 30 capsule 0   hydrocortisone  (CORTEF ) 5 MG tablet Take 4 tablets (20 mg) by mouth daily in the morning and 2 tablets (10 mg) in the evening over the next 3 days then go back to home dose of 2 tablets (10 mg) in the morning, and 1 tablet (5mg ) in the evening. 60 tablet 0   hydrOXYzine  (ATARAX ) 10 MG tablet Take 1 tablet (10 mg total) by mouth 3 (three) times daily as needed for anxiety. 30 tablet 0   hyoscyamine  (LEVSIN /SL) 0.125 MG SL tablet Place 1 tablet (0.125 mg total) under the tongue every 4 (four) hours as needed. 30 tablet 0   lamoTRIgine  (LAMICTAL ) 25 MG tablet Take  1 tablet (25 mg total) by mouth 2 (two) times daily. 60 tablet 0   levETIRAcetam  (KEPPRA ) 500 MG tablet Take 1 tablet (500 mg total) by mouth every 8 (eight) hours. 90 tablet 0   losartan  (COZAAR ) 25 MG tablet Take 1 tablet (25 mg total) by mouth daily. 90 tablet 1   methadone  (DOLOPHINE ) 10 MG/ML solution Take 125 mg by mouth daily.     naloxone  (NARCAN ) 0.4 MG/ML injection Inject 1 mL (0.4 mg total) into the vein as needed.     ondansetron  (ZOFRAN -ODT) 4 MG disintegrating tablet 4mg  ODT q4 hours prn nausea/vomit 20 tablet 0   pantoprazole  (PROTONIX ) 40 MG tablet Take 1 tablet (40 mg total) by mouth daily at 6 (six) AM. 30 tablet 0   polyethylene glycol (MIRALAX ) 17 g packet Take 17 g by mouth daily. 14 each 0   potassium chloride  (KLOR-CON ) 10 MEQ tablet Take 10 mEq by mouth daily.     traZODone  (DESYREL ) 50 MG tablet Take 1 tablet (50 mg total) by mouth at bedtime as needed for sleep. 30 tablet 0   VIENVA  0.1-20 MG-MCG tablet Take 1 tablet by mouth daily.     No current facility-administered medications on file prior to visit.    ALLERGIES: Allergies  Allergen Reactions   Bee Venom Anaphylaxis   Iodinated Contrast Media Anaphylaxis   Other Anaphylaxis    CT dye   Toradol [Ketorolac Tromethamine] Shortness Of Breath   Benadryl  [Diphenhydramine ] Other (See Comments)    States she cannot breathe    FAMILY HISTORY: Family History  Problem Relation Age of Onset   Cancer Mother    Hypertension Mother    Diabetes Maternal Grandmother    Cancer Maternal Grandfather    Diabetes Maternal Grandfather     SOCIAL HISTORY: Social History   Socioeconomic History   Marital status: Single    Spouse name: Not on file   Number of children: 4   Years of education: Not on file   Highest education level: Not on file  Occupational History   Occupation: Astronomer  Tobacco Use   Smoking status: Never   Smokeless tobacco: Never  Vaping Use   Vaping status: Never Used  Substance  and Sexual Activity   Alcohol  use: No   Drug use: Not Currently    Types: Marijuana    Comment: every other day use   Sexual activity: Yes  Other Topics Concern   Not on file  Social History Narrative   Are you right handed or left handed? Right    Are  you currently employed ? No    What is your current occupation? Disability    Do you live at home alone? No    Who lives with you? Fiance    What type of home do you live in: 1 story or 2 story?  1 story        Social Drivers of Corporate investment banker Strain: Not on file  Food Insecurity: No Food Insecurity (05/20/2023)   Hunger Vital Sign    Worried About Running Out of Food in the Last Year: Never true    Ran Out of Food in the Last Year: Never true  Transportation Needs: No Transportation Needs (05/20/2023)   PRAPARE - Administrator, Civil Service (Medical): No    Lack of Transportation (Non-Medical): No  Physical Activity: Not on file  Stress: Not on file  Social Connections: Not on file  Intimate Partner Violence: Not At Risk (05/20/2023)   Humiliation, Afraid, Rape, and Kick questionnaire    Fear of Current or Ex-Partner: No    Emotionally Abused: No    Physically Abused: No    Sexually Abused: No     PHYSICAL EXAM: Vitals:   10/23/23 1421  BP: 138/82  Pulse: 100  SpO2: 96%   General: No acute distress Head:  Normocephalic/atraumatic Skin/Extremities: No rash, no edema Neurological Exam: alert and awake. No aphasia or dysarthria. Fund of knowledge is appropriate.  Attention and concentration are normal.   Cranial nerves: Pupils equal, round. Extraocular movements intact with no nystagmus. Visual fields full.  No facial asymmetry.  Motor: Bulk and tone normal, muscle strength 5/5 throughout with no pronator drift.   Finger to nose testing intact.  Gait mild spastic hemiparetic gait on the right.    IMPRESSION: This is a 43 yo RH woman with a history of Addison's disease, hypertension, chronic  pancreatitis, chronic pain on Methadone , chronic nausea, history of prolactinoma, bipolar disorder, left pontine stroke in 04/2023, protein S deficiency, and seizures. There is note of seizures in 2015/2023 (started on Levetiracetam  at that time), then after stroke in 04/2023, she started having episodes of unresponsiveness that were captured on EEG consistent with functional seizures (psychogenic non-epileptic events). She denies any further episodes since 07/2023. She is doing well and motivated to continue home PT exercises. We again discussed functional seizures and the importance of stress management. Continue Levetiracetam  500mg  TID and Lamotrigine  25mg  BID. Continue aspirin , control of vascular risk factors for secondary stroke prevention. She is aware of [place] driving laws to stop driving after a seizure until 6 months seizure-free. Follow-up in 6 months, call for any changes.   Thank you for allowing me to participate in her care.  Please do not hesitate to call for any questions or concerns.    Darice Shivers, M.D.   CC: Dr. Alec

## 2023-10-23 NOTE — Patient Instructions (Signed)
 Good to see you doing much better! Continue all your medications. Continue home PT exercises. Follow-up in 6 months, call for any changes.    Seizure precautions: 1. If medication has been prescribed for you to prevent seizures, take it exactly as directed.  Do not stop taking the medicine without talking to your doctor first, even if you have not had a seizure in a long time.   2. Avoid activities in which a seizure would cause danger to yourself or to others.  Don't operate dangerous machinery, swim alone, or climb in high or dangerous places, such as on ladders, roofs, or girders.  Do not drive unless your doctor says you may.  3. If you have any warning that you may have a seizure, lay down in a safe place where you can't hurt yourself.    4.  No driving for 6 months from last seizure, as per [place]  state law.   Please refer to the following link on the [employer]ile[employer]'s website for more information: http://www.epilepsyfoundation.org/answerplace/Social/driving/drivingu.cfm   5.  Maintain good sleep hygiene. Avoid alcohol .  6.  Notify your neurology if you are planning pregnancy or if you become pregnant.  7.  Contact your doctor if you have any problems that may be related to the medicine you are taking.  8.  Call 911 and bring the patient back to the ED if:        A.  The seizure lasts longer than 5 minutes.       B.  The patient doesn't awaken shortly after the seizure  C.  The patient has new problems such as difficulty seeing, speaking or moving  D.  The patient was injured during the seizure  E.  The patient has a temperature over 102 F (39C)  F.  The patient vomited and now is having trouble breathing

## 2023-10-29 ENCOUNTER — Ambulatory Visit: Admitting: Occupational Therapy

## 2023-10-29 ENCOUNTER — Ambulatory Visit: Admitting: Rehabilitative and Restorative Service Providers"

## 2023-10-29 ENCOUNTER — Ambulatory Visit

## 2023-11-08 ENCOUNTER — Emergency Department (HOSPITAL_COMMUNITY)
Admission: EM | Admit: 2023-11-08 | Discharge: 2023-11-09 | Discharge status: Against Medical Advice | Attending: Emergency Medicine | Admitting: Emergency Medicine

## 2023-11-08 ENCOUNTER — Encounter (HOSPITAL_COMMUNITY): Payer: Self-pay | Admitting: *Deleted

## 2023-11-08 ENCOUNTER — Other Ambulatory Visit: Payer: Self-pay

## 2023-11-08 DIAGNOSIS — Z5321 Procedure and treatment not carried out due to patient leaving prior to being seen by health care provider: Secondary | ICD-10-CM | POA: Insufficient documentation

## 2023-11-08 DIAGNOSIS — R1110 Vomiting, unspecified: Secondary | ICD-10-CM | POA: Insufficient documentation

## 2023-11-08 LAB — COMPREHENSIVE METABOLIC PANEL WITH GFR
ALT: 28 U/L (ref 0–44)
AST: 32 U/L (ref 15–41)
Albumin: 3.5 g/dL (ref 3.5–5.0)
Alkaline Phosphatase: 50 U/L (ref 38–126)
Anion gap: 12 (ref 5–15)
BUN: 8 mg/dL (ref 6–20)
CO2: 25 mmol/L (ref 22–32)
Calcium: 8.8 mg/dL — ABNORMAL LOW (ref 8.9–10.3)
Chloride: 100 mmol/L (ref 98–111)
Creatinine, Ser: 0.75 mg/dL (ref 0.44–1.00)
GFR, Estimated: >60 mL/min (ref >60)
Glucose, Bld: 90 mg/dL (ref 70–99)
Potassium: 3.7 mmol/L (ref 3.5–5.1)
Sodium: 137 mmol/L (ref 135–145)
Total Bilirubin: 0.4 mg/dL (ref 0.0–1.2)
Total Protein: 6.6 g/dL (ref 6.5–8.1)

## 2023-11-08 LAB — CBC
HCT: 41.8 % (ref 36.0–46.0)
Hemoglobin: 13.8 g/dL (ref 12.0–15.0)
MCH: 30.8 pg (ref 26.0–34.0)
MCHC: 33 g/dL (ref 30.0–36.0)
MCV: 93.3 fL (ref 80.0–100.0)
Platelets: 265 K/uL (ref 150–400)
RBC: 4.48 MIL/uL (ref 3.87–5.11)
RDW: 11.9 % (ref 11.5–15.5)
WBC: 7 K/uL (ref 4.0–10.5)
nRBC: 0 % (ref 0.0–0.2)

## 2023-11-08 LAB — LIPASE, BLOOD: Lipase: 27 U/L (ref 11–51)

## 2023-11-08 NOTE — ED Triage Notes (Signed)
 Pt states she has been vomiting for the past week. She denies any diarrhea. Unable to be seen by her doctor.

## 2023-11-09 MED ORDER — LACTATED RINGERS IV BOLUS: 1000.0000 mL | Freq: Once | INTRAVENOUS | Status: DC

## 2023-11-09 MED ORDER — ONDANSETRON HCL 4 MG/2ML IJ SOLN: 4.0000 mg | Freq: Once | INTRAMUSCULAR | Status: DC

## 2023-11-09 NOTE — ED Notes (Signed)
 Pt not answering to multiple calls for room

## 2023-11-12 ENCOUNTER — Encounter: Payer: Self-pay | Admitting: Occupational Therapy

## 2023-11-12 ENCOUNTER — Ambulatory Visit: Admitting: Occupational Therapy

## 2023-11-12 ENCOUNTER — Encounter: Payer: Self-pay | Admitting: Physical Therapy

## 2023-11-12 ENCOUNTER — Ambulatory Visit: Attending: Physical Medicine & Rehabilitation | Admitting: Physical Therapy

## 2023-11-12 DIAGNOSIS — R278 Other lack of coordination: Secondary | ICD-10-CM | POA: Insufficient documentation

## 2023-11-12 DIAGNOSIS — M6281 Muscle weakness (generalized): Secondary | ICD-10-CM | POA: Diagnosis present

## 2023-11-12 DIAGNOSIS — R2689 Other abnormalities of gait and mobility: Secondary | ICD-10-CM | POA: Diagnosis present

## 2023-11-12 DIAGNOSIS — R2681 Unsteadiness on feet: Secondary | ICD-10-CM | POA: Diagnosis present

## 2023-11-12 DIAGNOSIS — R208 Other disturbances of skin sensation: Secondary | ICD-10-CM

## 2023-11-12 DIAGNOSIS — I69351 Hemiplegia and hemiparesis following cerebral infarction affecting right dominant side: Secondary | ICD-10-CM

## 2023-11-12 DIAGNOSIS — M25511 Pain in right shoulder: Secondary | ICD-10-CM | POA: Insufficient documentation

## 2023-11-12 NOTE — Addendum Note (Signed)
 Addended by: Keir Foland M on: 11/12/2023 04:43 PM   Modules accepted: Orders

## 2023-11-12 NOTE — Therapy (Signed)
 OUTPATIENT OCCUPATIONAL THERAPY NEURO  Treatment Note and Discharge  Patient Name: Brittany Mullins MRN: [id number] DOB:December 24, 1980, 43 y.o., female Today's Date: 11/12/2023  PCP: Alec House, MD REFERRING PROVIDER: Carilyn Prentice BRAVO, MD  END OF SESSION:  OT End of Session - 11/12/23 1409     Visit Number 3    Date for OT Re-Evaluation 12/19/23    Authorization Type Medicare Part A&B / [place] Medicaid 2025    Authorization Time Period 27 visit limit OT/PT/ST    OT Start Time 1400    Behavior During Therapy WFL for tasks assessed/performed           Past Medical History:  Diagnosis Date   Addison's disease (HCC)    Brain tumor (benign) (HCC)    Chronic pancreatitis (HCC)    Hearing loss    Hypertension    Marijuana use    Per pt: medical marijuana patient   Pancreatitis    PCOS (polycystic ovarian syndrome)    Pituitary tumor    Seizures (HCC)    Past Surgical History:  Procedure Laterality Date   ABDOMINAL HYSTERECTOMY     APPENDECTOMY     BIOPSY  08/16/2021   Procedure: BIOPSY;  Surgeon: San Sandor GAILS, DO;  Location: [facility];  Service: Gastroenterology;;   CESAREAN SECTION     CHOLECYSTECTOMY     ELBOW SURGERY     ESOPHAGOGASTRODUODENOSCOPY (EGD) WITH PROPOFOL  N/A 08/16/2021   Procedure: ESOPHAGOGASTRODUODENOSCOPY (EGD) WITH PROPOFOL ;  Surgeon: San Sandor GAILS, DO;  Location: [facility];  Service: Gastroenterology;  Laterality: N/A;   kidney stent     pancreatic stent     RADIOLOGY WITH ANESTHESIA N/A 04/25/2023   Procedure: MRI of Brain without contrast;  Surgeon: Radiologist, Medication, MD;  Location: [facility] OR;  Service: Radiology;  Laterality: N/A;   RADIOLOGY WITH ANESTHESIA N/A 05/03/2023   Procedure: MRI WITH ANESTHESIA CERVICAL SPINE WITHOUT CONTRAST;  Surgeon: Radiologist, Medication, MD;  Location: [facility] OR;  Service: Radiology;  Laterality: N/A;   Patient Active Problem List   Diagnosis Date Noted   Superficial thrombophlebitis  06/14/2023   Low protein S 05/19/2023   Sepsis (HCC) 05/19/2023   Blurry vision, left eye 05/19/2023   Nephrolithiasis 05/19/2023   Dysphagia 05/19/2023   Seizure (HCC) 05/19/2023   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   HTN (hypertension) 04/23/2023   Stroke (cerebrum) (HCC) 04/22/2023   Dizziness 04/22/2023   Intractable vomiting with nausea 08/17/2022   Abdominal pain, epigastric    Gastritis and gastroduodenitis    AKI (acute kidney injury) (HCC) 08/15/2021   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   Intractable nausea and vomiting with acute abdominal pain 10/25/2019   History of Addison's disease    Intractable cyclical vomiting with nausea    Chronic pain disorder 06/03/2017   Opioid dependence (HCC) 06/03/2017   Hypokalemia/hypomangesia 06/03/2017   Dehydration 06/03/2017   Generalized abdominal pain    Marijuana use    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   Bipolar I disorder with mixed features (HCC) 07/07/2012    ONSET DATE: 04/22/23  REFERRING DIAG: P36.97 (ICD-10-CM) - Cerebrovascular accident (CVA) due to thrombosis of basilar artery  THERAPY DIAG:  Unsteadiness on feet  Muscle weakness (generalized)  Other disturbances of skin sensation  Hemiplegia and hemiparesis following cerebral infarction affecting right dominant side (HCC)  Other lack of coordination  Right shoulder pain, unspecified chronicity  Rationale for Evaluation and Treatment: Rehabilitation  SUBJECTIVE:   SUBJECTIVE STATEMENT: Pt reports feeling a lot better today as  she had strep throat last week. Pt accompanied by: self (dropped off)  PERTINENT HISTORY: RH- female with Addison's disease, PCOS, pancreatitis with chronic N/V, seizures, PUD/duodenitis, chronic pain (abdominal due to pancreatitis)- on methadone , bipolar d/o, marijuana use who was [facility] to [facility] on [facility]23 with reports of right sided numbness that started day before as well as reports of abnormal gait with  complaints of left calf pain per family. UDS positive for THC. CTA head/neck was negative for LVO. MRI brain done revealing acute paramedian left pontine infarct and ultrasound BLE dopplers were negative for DVT. She was noted to have slurred speech with intermittent aphasia and recommended DAPT X 3 weeks for stroke felt to be due to small vessel disease.  PRECAUTIONS: Fall  WEIGHT BEARING RESTRICTIONS: No  PAIN:  Are you having pain? Yes: NPRS scale: 5/10 Pain location: R ankle Pain description: aching Aggravating factors: when I put pressure through my foot/leg Relieving factors: repositioning  FALLS: Has patient fallen in last 6 months? Yes. Number of falls ~5, balance is off  LIVING ENVIRONMENT: Lives with: lives with their partner (fiance) Lives in: House/apartment Stairs: No Has following equipment at home: Environmental consultant - 4 wheeled, Shower bench, bed side commode, and AFO  PLOF: Independent, Independent with basic ADLs, and Vocation/Vocational requirements: on disability - previously working, cleaning at [employer]  PATIENT GOALS: get back to being independent  OBJECTIVE:  Note: Objective measures were completed at Evaluation unless otherwise noted.  HAND DOMINANCE: Ambidextrous, can use both but prefers R for cutting, writing, eating  ADLs: Transfers/ambulation related to ADLs: Rollator for mobility at all times Eating: get choked easy Grooming: difficulty with brushing/styling hair and applying makeup due to R shoulder pain and shakiness in R hand UB Dressing: occasional difficulty getting shirt over head LB Dressing: difficulty with threading pants over feet, needs assistance with AFO, occasional assistance to get R shoe on Toileting: difficulty with hygiene post toileting Bathing: difficulty with washing bottom Tub Shower transfers: currently getting into bath tub for bathing, fiance will have to aid in lifting her out afterwards.  Reports having a tub bench  but that it doesn't fit in her shower. Equipment: Transfer tub bench, bed side commode, and Reacher  IADLs: Light housekeeping: have tried to do the dishes, but typically uses dishwasher.  Pt with difficulty emptying washer/dryer and dishwasher.   Meal Prep: can do simple sandwich/cereal and microwave meals, but not cooking due to concern of cutting or burning self Community mobility: not currently driving Medication management: family member assists with filling weekly pill box Financial management: fiance is managing at this time - reports memory impairment Handwriting: 100% legible; PPT #1(wh[place] live in [place]): 17.28 sec  MOBILITY STATUS: Needs Assist: utilizing Rollator for mobility  POSTURE COMMENTS:  rounded shoulders and forward head  ACTIVITY TOLERANCE: Activity tolerance: diminished, reports fatiguing quickly with attempts to grocery shop  FUNCTIONAL OUTCOME MEASURES: PSFS 3.3   UPPER EXTREMITY ROM:    Active ROM Right eval Left eval  Shoulder flexion 112   Shoulder abduction 72   Shoulder adduction    Shoulder extension    Shoulder internal rotation 50%, able to reach to top of hip   Shoulder external rotation 95%   Elbow flexion    Elbow extension    Wrist flexion    Wrist extension    Wrist ulnar deviation    Wrist radial deviation    Wrist pronation 90%   Wrist supination 75%   (Blank  rows = not tested)  UPPER EXTREMITY MMT:     MMT Right eval Left eval  Shoulder flexion    Shoulder abduction    Shoulder adduction    Shoulder extension    Shoulder internal rotation    Shoulder external rotation    Middle trapezius    Lower trapezius    Elbow flexion    Elbow extension    Wrist flexion    Wrist extension    Wrist ulnar deviation    Wrist radial deviation    Wrist pronation    Wrist supination    (Blank rows = not tested)  HAND FUNCTION: 09/16/23: right: 41, 40, 30 (average 37#) lbs and left: 63, 55, 55 (average 57.6#)  lbs.  COORDINATION: Finger Nose Finger test: Good symmetry bilaterally, noted guarding in R shoulder with movement and slower by 50% on R 9 Hole Peg test: Right: 43.47 sec; Left: 25.72 sec - pt with decreased use of index finger on R hand when placing and removing pegs Box and Blocks:  Right 32 blocks, Left 48 blocks  SENSATION: Pt reports impaired sensation  COGNITION: Overall cognitive status: Within functional limits for tasks assessed; does report changes in memory  VISION: Subjective report: reports difficulty with peripheral vision, blurriness in periphery Baseline vision: Wears glasses for distance only  VISION ASSESSMENT: Not tested To be further assessed in functional context                                                                                                                             TREATMENT DATE:  11/12/23 Patient seen for OT after PT session.  Addressed OT goals - short and long term as patient has had long gap in service with ill son.  Patient has met all OT goals, is routinely exercising in a pool, and is now needing only occasional assistance with BADL.  Patient with nearly full right shoulder range of motion, motion is pain free, however still lacks full proprioceptive and somatosensory awareness.   Patient tearful at end of session when she realized she has met all of her goals.   09/16/23 Grip strength: right: 41, 40, 30 (average 37#) lbs and left: 63, 55, 55 (average 57.6#) lbs. DME: OT educated pt on proper setup of tub bench to increase safety with tub/shower transfers.  OT educated pt on placement of shower curtain behind seat (within tub) and through slats in seat and infront of seat (in tub).  OT providing demonstration and pictures for carryover. AE: OT educated pt on use of reacher to aid in donning pants, educating also on hemi-dressing technique with dressing RLE first.  OT provided demonstration and pt returned demonstration with use of gait  belt and reacher to simulate donning pants.  OT provided handout on hemi-dressing with reacher (see pt instructions). OT educated on use of sock aid and shoe funnel to aid in donning socks and shoes.  Pt trialed both on with good  success.  OT provided pt with pictures of each and encouraged pt to purchase items as needed.      09/02/23 Educated on role and purpose of OT as well as potential interventions and goals for therapy based on initial evaluation findings.  Engaged in discussion regarding typical routine for bathing and potential benefit from use of AE and/or DME to increase ease and independence with bathing and LB dressing.    PATIENT EDUCATION: Education details: DME and AE for LB dressing and safe tub/shower transfers Person educated: Patient Education method: Explanation Education comprehension: verbalized understanding and needs further education  HOME EXERCISE PROGRAM: DME/AE (see pt instructions)   GOALS: Goals reviewed with patient? Yes  SHORT TERM GOALS: Target date: 09/27/23  Pt will be independent in ROM and coordination HEP with use of visual handouts. Baseline: new to OP OT Goal status: Goal met  2.  Pt will verbalize understanding of task modifications and/or potential DME/AE needs to increase ease, safety, and independence w/ ADLs. Baseline: has reacher and tub bench but is not using either Goal status: in progress  3.  Pt will verbalize decreased pain in R shoulder to <5/10 to allow for increased functional use. Baseline: pain 7/10 on eval, reports difficulty with styling hair Goal status: Goal met-reports no pain   LONG TERM GOALS: Target date: 10/18/23  Pt will verbalize and/or demonstrate PRN safe tub/shower transfers with use of DME PRN. Baseline: has tub bench but reports that it does not fit in her shower Goal status: goal met  2.  Pt will be Mod I with LB dressing, to include donning shoe and AFO, with use of AE PRN. Baseline: needs help donning  underwear/pants over R leg, shoe and AFO Goal status: goal met  3.   Pt will demonstrate improved functional ROM of RUE to aid in ability to pull shirt over head and complete hygiene post toileting. Baseline: decreased R shoulder ROM, impaired sensation, and shoulder pain Goal status: Goal met  4.  Pt will demonstrate improved UE functional use for ADLs as evidenced by increasing box/ blocks score by 5 blocks with RUE. Baseline: Right 32 blocks, Left 48 blocks Goal status: Goal met- 42 blocks!  5.  Patient will report at least two-point increase in average PSFS score or at least three-point increase in a single activity score indicating functionally significant improvement given minimum detectable change. Baseline: 3.3 Goal status: Goal met  ASSESSMENT:  CLINICAL IMPRESSION: Patient has had lengthy gap in OT program.  Patient's son is currently in treatment for bone cancer.  Patient has shown tremendous functional improvement, and has met and exceeded OT goals.  Mutually made decision to discontinue further OT at this time.  Instructed patient to reach out to MD if at any time she felt she needed additional OT to address RUE functioning or BADL skills.     PERFORMANCE DEFICITS: in functional skills including ADLs, IADLs, coordination, sensation, ROM, strength, pain, Fine motor control, Gross motor control, balance, body mechanics, endurance, decreased knowledge of precautions, decreased knowledge of use of DME, vision, and UE functional use, cognitive skills including memory, and psychosocial skills including environmental adaptation, habits, and routines and behaviors.    PLAN:  OT FREQUENCY: 2x/week  OT DURATION: 6 weeks  PLANNED INTERVENTIONS: 97168 OT Re-evaluation, 97535 self care/ADL training, 02889 therapeutic exercise, 97530 therapeutic activity, 97112 neuromuscular re-education, 97035 ultrasound, [id number] electrical stimulation (manual), [id number] Orthotic Initial, [id number]  Orthotic/Prosthetic subsequent, passive range of motion, functional mobility training, compression bandaging,  visual/perceptual remediation/compensation, psychosocial skills training, energy conservation, coping strategies training, patient/family education, and DME and/or AE instructions  RECOMMENDED OTHER SERVICES: NA  CONSULTED AND AGREED WITH PLAN OF CARE: Patient  PLAN FOR NEXT SESSION: discharge from OT  Fransico Allean HERO, OTR/L 11/12/2023, 2:11 PM  [facility] [address] [place], [place] [address] Phone # [phone] Fax # [phone]

## 2023-11-12 NOTE — Therapy (Signed)
 OUTPATIENT PHYSICAL THERAPY NEURO TREATMENT/RECERT/DISCHARGE SUMMARY   Patient Name: Manon Banbury MRN: [id number] DOB:07-06-1980, 43 y.o., female Today's Date: 11/12/2023   PCP: Alec House, MD REFERRING PROVIDER: Carilyn Prentice BRAVO, MD   PHYSICAL THERAPY DISCHARGE SUMMARY  Visits from Start of Care: 4  Current functional level related to goals / functional outcomes: See below-pt has met all LTGs   Remaining deficits: High level balance, flexibility, RLE strength (all improving)   Education / Equipment: Educated in [employer], community fitness -to continue to use pool and to look into [employer].   Patient agrees to discharge. Patient goals were met. Patient is being discharged due to meeting the stated rehab goals.  END OF SESSION:  PT End of Session - 11/12/23 1305     Visit Number 4    Number of Visits 12    Date for PT Re-Evaluation 11/12/23    Authorization Type Medicare/Medicaiad    PT Start Time 1310    PT Stop Time 1353    PT Time Calculation (min) 43 min    Equipment Utilized During Treatment Gait belt    Activity Tolerance Patient tolerated treatment well    Behavior During Therapy WFL for tasks assessed/performed            Past Medical History:  Diagnosis Date   Addison's disease (HCC)    Brain tumor (benign) (HCC)    Chronic pancreatitis (HCC)    Hearing loss    Hypertension    Marijuana use    Per pt: medical marijuana patient   Pancreatitis    PCOS (polycystic ovarian syndrome)    Pituitary tumor    Seizures (HCC)    Past Surgical History:  Procedure Laterality Date   ABDOMINAL HYSTERECTOMY     APPENDECTOMY     BIOPSY  08/16/2021   Procedure: BIOPSY;  Surgeon: San Sandor GAILS, DO;  Location: [facility];  Service: Gastroenterology;;   CESAREAN SECTION     CHOLECYSTECTOMY     ELBOW SURGERY     ESOPHAGOGASTRODUODENOSCOPY (EGD) WITH PROPOFOL  N/A 08/16/2021   Procedure: ESOPHAGOGASTRODUODENOSCOPY (EGD) WITH PROPOFOL ;  Surgeon:  San Sandor GAILS, DO;  Location: [facility];  Service: Gastroenterology;  Laterality: N/A;   kidney stent     pancreatic stent     RADIOLOGY WITH ANESTHESIA N/A 04/25/2023   Procedure: MRI of Brain without contrast;  Surgeon: Radiologist, Medication, MD;  Location: [facility];  Service: Radiology;  Laterality: N/A;   RADIOLOGY WITH ANESTHESIA N/A 05/03/2023   Procedure: MRI WITH ANESTHESIA CERVICAL SPINE WITHOUT CONTRAST;  Surgeon: Radiologist, Medication, MD;  Location: [facility];  Service: Radiology;  Laterality: N/A;   Patient Active Problem List   Diagnosis Date Noted   Superficial thrombophlebitis 06/14/2023   Low protein S 05/19/2023   Sepsis (HCC) 05/19/2023   Blurry vision, left eye 05/19/2023   Nephrolithiasis 05/19/2023   Dysphagia 05/19/2023   Seizure (HCC) 05/19/2023   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   HTN (hypertension) 04/23/2023   Stroke (cerebrum) (HCC) 04/22/2023   Dizziness 04/22/2023   Intractable vomiting with nausea 08/17/2022   Abdominal pain, epigastric    Gastritis and gastroduodenitis    AKI (acute kidney injury) (HCC) 08/15/2021   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   Intractable nausea and vomiting with acute abdominal pain 10/25/2019   History of Addison's disease    Intractable cyclical vomiting with nausea    Chronic pain disorder 06/03/2017   Opioid dependence (HCC) 06/03/2017   Hypokalemia/hypomangesia 06/03/2017   Dehydration 06/03/2017  Generalized abdominal pain    Marijuana use    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   Bipolar I disorder with mixed features (HCC) 07/07/2012    ONSET DATE: 04/22/23  REFERRING DIAG:  P36.97 (ICD-10-CM) - Cerebrovascular accident (CVA) due to thrombosis of basilar artery (HCC)    THERAPY DIAG:  Other abnormalities of gait and mobility  Unsteadiness on feet  Muscle weakness (generalized)  Rationale for Evaluation and Treatment: Rehabilitation  SUBJECTIVE:                                                                                                                                                                                              SUBJECTIVE STATEMENT: Been able to work on exercises in the pool.  Was confused because I thought I graduated last session.  Pt reports walking without difficulty without her walker.  Able to walk at least 15 minutes without walker and has had no falls.  Pt accompanied by: self  PERTINENT HISTORY: history of Addison's disease, hypertension, chronic pancreatitis, chronic pain on Methadone , chronic nausea, history of prolactinoma, bipolar disorder, left pontine stroke in 04/2023, protein S deficiency, and seizuresadmitted on 04/22/2023 for right-sided numbness and abnormal gait with left calf pain. MRI brain showed an acute paramedian left pontine infarct.  PAIN:  Are you having pain? no  PRECAUTIONS: Fall  RED FLAGS: None   WEIGHT BEARING RESTRICTIONS: No  FALLS: Has patient fallen in last 6 months? Yes. Number of falls 5 no injuries other than skin tear  LIVING ENVIRONMENT: Lives with: lives with an adult companion Lives in: House/apartment, no stairs   PLOF:  Level of assistance: Independent with ADLs, Independent with IADLs Employment: On disability (Addison's Syndrome)    PATIENT GOALS: walk without AD  OBJECTIVE:     TODAY'S TREATMENT: 11/12/2023 Activity Comments  Tandem gait forward/back   Gait with head turns/nods, varied speeds No LOB  In prone :  quad stretch 30 sec Hamstring curls RLE  Standing gastroc/runner's stretch, 3 x 30 sec, RLE   Seated RLE hamstring stretch, foot propped on 2 block 30 sec x 2 reps  NuStep, Level 3>6 x 8  minutes, BLES Higher speed with lighter resistance  Floor to stand transfer More difficulty with UE support needed, coming up from RLE  Lunge squat step Good demo, to perform at pool   HOME EXERCISE PROGRAM Access Code: [email or web address] URL: [email or web address] Date:  11/12/2023 Prepared by: [facility] - Outpatient  Rehab - [facility]  Exercises - Forward and Backward Stepping at [facility]  - 1 x daily - 7 x weekly - 2 sets - 10 reps -  Standing Lumbar Extension with Counter  - 1 x daily - 7 x weekly - 1 sets - 10 reps - Tandem Stance (Mirrored)  - 1 x daily - 7 x weekly - 2 sets - 30 sec hold - Standing March at [facility]  - 1 x daily - 7 x weekly - 2 sets - 10 reps - Mini Squat with Counter Support  - 1 x daily - 7 x weekly - 2 sets - 10 reps - Walking Butt Kicks  - 1 x daily - 7 x weekly - 2 sets - 10 reps - Seated Hamstring Stretch  - 2-3 x daily - 7 x weekly - 1 sets - 3 reps - 30 sec hold - Standing Gastroc Stretch at Counter  - 2-3 x daily - 7 x weekly - 1 sets - 3 reps - 30 sec hold - Lunge on Step  (in pool) - 1 x daily - 7 x weekly - 3 sets - 5 reps        PATIENT EDUCATION: Education details: Additions to HEP, progress towards goals, POC; plans for discharge this visit. Person educated: Patient Education method: Explanation, Demonstration, Tactile cues, Verbal cues, and Handouts Education comprehension: verbalized understanding and returned demonstration     Note: Objective measures were completed at Evaluation unless otherwise noted.  COORDINATION: Difficulty with rapid toe tap RLE Heel to shin impaired w/ RLE  POSTURE: rounded shoulders and forward head  LOWER EXTREMITY ROM:     Active  Right Eval Left Eval  Hip flexion    Hip extension    Hip abduction    Hip adduction    Hip internal rotation    Hip external rotation    Knee flexion    Knee extension    Ankle dorsiflexion 5 15  Ankle plantarflexion    Ankle inversion    Ankle eversion     (Blank rows = not tested)  LOWER EXTREMITY MMT:    MMT Right Eval Left Eval  Hip flexion    Hip extension    Hip abduction 3+ 5  Hip adduction    Hip internal rotation    Hip external rotation    Knee flexion 4 5  Knee extension 3+ 5  Ankle dorsiflexion 3- 5   Ankle plantarflexion 2+ 3  Ankle inversion    Ankle eversion    (Blank rows = not tested)   CURB:  Findings: SBA-CGA  STAIRS: Findings: Level of Assistance: CGA, Number of Stairs: 6, Height of Stairs: 4-6   , and Comments: BHR and step-to GAIT: Findings: Gait Characteristics: decreased stance time- Right, decreased stride length, and poor foot clearance- Right, Distance walked:  , and Comments: reports endurance for about 6-10 minutes of walking w/ 5TT Level surfaces w/out AD and CGA x 30 ft  FUNCTIONAL TESTS:  Eval 09/02/23:  5 times sit to stand: 15 Timed up and go (TUG):  10 Meter Walk Test: 25.34 sec = 1.29 ft sec Dynamic Gait Index: 8/24   10/22/23: Dynamic Gait Index: 22/24     Berg Balance Scale:  Item Test date: 09/02/23 Test date:  Test date:   Sitting to standing 4. able to stand without using hands and stabilize independently 4. able to stand without using hands and stabilize independently  4. able to stand safely for 2 minutes  4. able to sit safely and securely for 2 minutes   4. sits safely with minimal use of hands  4. able to transfer safely  with minor use of hands  4. able to stand 10 seconds safely  4. able to place feet together independently and stand 1 minute safely  4. can reach forward confidently 25 cm (10 inches)  4. able to pick up slipper safely and easily  4. looks behind from both sides and weight shifts well  4. able to turn 360 degrees safely in 4 seconds or less  4. ble to stand independently and safely and complete 8 steps in 20 seconds  3. able to place foot ahead independently and hold 30 seconds  4. able to lift leg independently and hold > 10 seconds    Insert OPRCBERGREEVAL SmartPhrase at re-test date  2. Standing unsupported 4. able to stand safely for 2 minutes    3. Sitting with back unsupported, feet supported 4. able to sit safely and securely for 2 minutes    4. Standing to sitting 4. sits safely with minimal use of hands     5. Pivot transfer  4. able to transfer safely with minor use of hands    6. Standing unsupported with eyes closed 3. able to stand 10 seconds with supervision    7. Standing unsupported with feet together 3. able to place feet together independently and stand 1 minute with supervision    8. Reaching forward with outstretched arms while standing 3. can reach forward 12 cm (5 inches)    9. Pick up object from the floor from standing 4. able to pick up slipper safely and easily    10. Turning to look behind over left and right shoulders while standing 3. looks behind one side only, other side shows less weight shift    11. Turn 360 degrees 3. able to turn 360 degrees safely, one side only, in 4 seconds or less    12. Place alternate foot on step or stool while standing unsupported 2. able to complete 4 steps without aid with supervision     13. Standing unsupported one foot in front 2. able to take small step independently and hold 30 seconds    14. Standing on one leg 3. able to lift leg independently and hold 5-10 seconds     Total Score 46/56 Total Score 55/56 Total Score /56       GOALS: Goals reviewed with patient? Yes  SHORT TERM GOALS: Target date: 09/30/2023    Patient will be independent in HEP to improve functional outcomes Baseline: Goal status: IN PROGRESS  2.  Demo improved standing balance and reduced risk for falls per score 52/56 Berg Balance Test for greater safety during ADL Baseline: 46/56 10/22/23: 55/56 Goal status: MET  3.  Ambulation level surfaces w/out AD and supervision x 100 ft for improved household safety/ambulation Baseline: CGA x 30 ft 10/22/23: Has been walking in grocery store without cane; will use cane for very far walks Goal status: MET    LONG TERM GOALS: Target date: 11/11/2023>11/12/2023    Demo improved balance and reduce risk for falls per score 17/24 Dynamic Gait Index Baseline: 8/24 10/22/23: 22/24 Goal status: MET  2.  Report/demo  improved ambulation endurance per report of 20 min continuous walking to improve community outings/endurance Baseline: reports 5-10 min limit 10/22/23: Able to walk from the parking lot and around the hospital/cancer center without any reported issues; 10-15 minutes to cancer center without difficulty Goal status: MET, 11/12/2023  3.  Demo modified independence with cane over various surfaces to improve efficiency for community ambulation Baseline: [phone]  10/22/23: Has been ambulating in the community without a/d except for longer distances such as going to the cancer center with her son; not using walker at all Goal status: MET, 11/12/2023  4.  Pt to demo/report independence with household ambulation (no AD) x 100 ft Baseline: CGA x 30 ft 10/22/23: Ambulating around grocery store per pt report without a/d Goal status: MET    ASSESSMENT:  CLINICAL IMPRESSION: Pt presents today with no new complaints; she continues to feel that she is moving well and getting stronger, attributing this to her work int he pool.  Reviewed HEP and pt verbalizes understanding for pool exercises.  She reports able to walk at least 15 minutes, varied surfaces, no device (she has progressed away from walker in the past 2 weeks).  Updated HEP for additional stretches to continue in the long term.  She is appropriate for discharge this visit.    OBJECTIVE IMPAIRMENTS: Abnormal gait, decreased activity tolerance, decreased balance, decreased coordination, decreased endurance, decreased knowledge of use of DME, difficulty walking, decreased ROM, decreased strength, dizziness, impaired flexibility, improper body mechanics, and pain.   ACTIVITY LIMITATIONS: carrying, lifting, bending, squatting, stairs, reach over head, and locomotion level  PARTICIPATION LIMITATIONS: meal prep, cleaning, interpersonal relationship, shopping, and community activity  PERSONAL FACTORS: Age, Time since onset of injury/illness/exacerbation, and 1-2  comorbidities: PMH are also affecting patient's functional outcome.   REHAB POTENTIAL: Good  CLINICAL DECISION MAKING: Evolving/moderate complexity  EVALUATION COMPLEXITY: Moderate  PLAN:  PT FREQUENCY: 1x/week  PT DURATION: other: 1 visit only  PLANNED INTERVENTIONS: 97750- Physical Performance Testing, 97110-Therapeutic exercises, 97530- Therapeutic activity, [id number]- Neuromuscular re-education, 97535- Self Care, [id number]- Manual therapy, [id number]- Gait training, [id number]- Orthotic Initial, [id number]- Orthotic/Prosthetic subsequent, and [phone]- Aquatic Therapy  PLAN FOR NEXT SESSION: Discharge this visit.  Greig Anon, PT 11/12/23 2:55 PM Phone: [phone] Fax: [phone]  [facility] at [facility] [address] [place], [address] [place], [place] [address] Phone # [phone] Fax # [phone]

## 2023-11-21 ENCOUNTER — Ambulatory Visit: Payer: Self-pay | Admitting: Nurse Practitioner

## 2023-12-19 ENCOUNTER — Other Ambulatory Visit

## 2023-12-19 ENCOUNTER — Ambulatory Visit: Admitting: Oncology

## 2023-12-20 ENCOUNTER — Inpatient Hospital Stay: Attending: Oncology

## 2023-12-20 ENCOUNTER — Inpatient Hospital Stay (HOSPITAL_BASED_OUTPATIENT_CLINIC_OR_DEPARTMENT_OTHER): Admitting: Oncology

## 2023-12-20 VITALS — BP 124/80 | HR 89 | Temp 97.6°F | Resp 17 | Ht 63.0 in | Wt 213.9 lb

## 2023-12-20 DIAGNOSIS — I10 Essential (primary) hypertension: Secondary | ICD-10-CM | POA: Insufficient documentation

## 2023-12-20 DIAGNOSIS — Z79899 Other long term (current) drug therapy: Secondary | ICD-10-CM | POA: Insufficient documentation

## 2023-12-20 DIAGNOSIS — R4701 Aphasia: Secondary | ICD-10-CM | POA: Insufficient documentation

## 2023-12-20 DIAGNOSIS — D689 Coagulation defect, unspecified: Secondary | ICD-10-CM

## 2023-12-20 DIAGNOSIS — R112 Nausea with vomiting, unspecified: Secondary | ICD-10-CM | POA: Diagnosis not present

## 2023-12-20 DIAGNOSIS — D6859 Other primary thrombophilia: Secondary | ICD-10-CM | POA: Diagnosis present

## 2023-12-20 DIAGNOSIS — Z8673 Personal history of transient ischemic attack (TIA), and cerebral infarction without residual deficits: Secondary | ICD-10-CM | POA: Insufficient documentation

## 2023-12-20 DIAGNOSIS — G40909 Epilepsy, unspecified, not intractable, without status epilepticus: Secondary | ICD-10-CM | POA: Insufficient documentation

## 2023-12-20 DIAGNOSIS — Z7982 Long term (current) use of aspirin: Secondary | ICD-10-CM | POA: Diagnosis not present

## 2023-12-20 DIAGNOSIS — R1310 Dysphagia, unspecified: Secondary | ICD-10-CM | POA: Insufficient documentation

## 2023-12-20 DIAGNOSIS — Z79891 Long term (current) use of opiate analgesic: Secondary | ICD-10-CM | POA: Insufficient documentation

## 2023-12-20 LAB — CBC WITH DIFFERENTIAL (CANCER CENTER ONLY)
Abs Immature Granulocytes: 0 K/uL (ref 0.00–0.07)
Basophils Absolute: 0 K/uL (ref 0.0–0.1)
Basophils Relative: 1 %
Eosinophils Absolute: 0.2 K/uL (ref 0.0–0.5)
Eosinophils Relative: 3 %
HCT: 39.4 % (ref 36.0–46.0)
Hemoglobin: 13.4 g/dL (ref 12.0–15.0)
Immature Granulocytes: 0 %
Lymphocytes Relative: 37 %
Lymphs Abs: 1.7 K/uL (ref 0.7–4.0)
MCH: 30.4 pg (ref 26.0–34.0)
MCHC: 34 g/dL (ref 30.0–36.0)
MCV: 89.3 fL (ref 80.0–100.0)
Monocytes Absolute: 0.4 K/uL (ref 0.1–1.0)
Monocytes Relative: 8 %
Neutro Abs: 2.3 K/uL (ref 1.7–7.7)
Neutrophils Relative %: 51 %
Platelet Count: 231 K/uL (ref 150–400)
RBC: 4.41 MIL/uL (ref 3.87–5.11)
RDW: 12 % (ref 11.5–15.5)
WBC Count: 4.5 K/uL (ref 4.0–10.5)
nRBC: 0 % (ref 0.0–0.2)

## 2023-12-20 LAB — APTT: aPTT: 27 s (ref 24–36)

## 2023-12-20 LAB — D-DIMER, QUANTITATIVE: D-Dimer, Quant: 0.44 ug{FEU}/mL (ref 0.00–0.50)

## 2023-12-20 LAB — PROTIME-INR
INR: 0.9 (ref 0.8–1.2)
Prothrombin Time: 12.7 s (ref 11.4–15.2)

## 2023-12-20 NOTE — Progress Notes (Signed)
 [facility]  HEMATOLOGY CLINIC PROGRESS NOTE  PATIENT NAME: Brittany Mullins   MR#: [id number] DOB: 04/09/1980  Patient Care Team: Alec House, MD as PCP - General (Family Medicine) Cindy Garnette POUR, MD as Attending Physician (Internal Medicine) Georjean Darice HERO, MD as Consulting Physician (Neurology)  Date of visit: 12/20/2023   ASSESSMENT & PLAN:   Brittany Mullins is a 43 y.o.  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    Hx of Low protein S. Repeat value normal in September 2025 -During her hospitalization in January 2025, for presumed stroke, neurology pursued hypercoagulable workup on 04/23/2023.  Prothrombin gene mutation, factor V Leiden mutation, beta-2  glycoprotein antibodies, anticardiolipin antibodies and lupus anticoagulant were all negative.  Protein C activity, Antithrombin III  activity were within normal limits.  Protein S activity was normal at 68.  Total protein S was slightly decreased at 48%.  Repeat protein S activity was normal on 04/30/2023 at 76%, free protein S level was also normal at 83%.  Total protein as was still slightly decreased at 51%.  Hence neurology suggested referral to hematology service for further evaluation.  -Slightly low total protein S level, with normal activity.  This can be potentially due to oral contraceptives or recent illness. Normal protein S activity levels indicate no current increased thrombotic risk. Re-evaluation of levels is necessary as recent hospitalization may affect accuracy.  Repeat testing on 12/20/2023 showed normal total protein S of 66%, normal protein S activity of 88%.  PT, PTT, D-dimer were  all within normal limits today.  CBC unremarkable.    -Rest of the hypercoagulable workup was unremarkable.  No routine indication for anticoagulation.  She can continue aspirin  81 mg daily, once cleared by GI.  Since repeat labs are unremarkable without any acute issues, she can be discharged from our office for continued follow-up with her PCP.  Please reconsult us  as necessary.  Nausea and vomiting with dysphagia Chronic nausea and vomiting occurring approximately twice a week, with episodes of choking and dysphagia, particularly with certain foods like rice. Previous endoscopy showed signs of gastritis. Esophageal dilation was delayed due to a previous stroke, but sufficient time has passed for reconsideration. - Follow up with Dr. Frutoso for evaluation and potential esophageal dilation.  Seizure disorder Seizures are currently well-controlled with increased seizure medication. No recent seizures reported.  General Health Maintenance Currently taking baby aspirin  as needed. Daily aspirin  could help minimize risk of blood clots, but current nausea and vomiting may exacerbate side effects. Awaiting clearance from GI doctors before resuming daily aspirin .  I spent a total of 20 minutes during this encounter with the patient including review of chart and various tests results, discussions about plan of care and coordination of care plan.  I reviewed lab results and outside records for this visit and discussed relevant results with the patient. Diagnosis, plan of care and treatment options were also discussed in detail with the patient. Opportunity provided to ask questions and answers provided to her apparent satisfaction. Provided instructions to call our clinic with any problems, questions or concerns prior to return visit. I recommended to continue follow-up with PCP and sub-specialists. She verbalized understanding and agreed with the plan.  No barriers to learning was detected.  Chinita Patten, MD  12/20/2023 4:59 PM  [facility] - A DEPT [facility] [address] [place] [address] Dept: [phone] Dept Fax: [phone]   CHIEF COMPLAINT/ REASON FOR VISIT:  History of low total protein S with normal protein S activity.  Repeat values were normal in September 2025.  No clinical implication.  INTERVAL HISTORY:  Discussed the use of AI scribe software for clinical note transcription with the patient, who gave verbal consent to proceed.  History of Present Illness Brittany Mullins is a 43 year old female with a history of gastritis and stroke who presents with persistent nausea and vomiting.  She experiences persistent nausea and vomiting, occurring at least twice a week, with episodes of vomiting about once a week. Discomfort is noted when eating, particularly with certain foods like rice, which causes pain as it goes down. She has experienced choking episodes, requiring intervention from her caregiver.  She has a history of gastritis, identified in a previous endoscopy a couple of years ago, which showed signs of inflammation in the stomach. She is scheduled to see Dr. Frutoso next week for further evaluation of her symptoms and potential esophageal dilation, which was delayed due to her previous stroke.  She has a history of seizures, which have been controlled with an increased dosage of her seizure medication, resulting in no recent seizure activity.  She takes baby aspirin  as needed, rather than daily. Her protein levels have been monitored due to previous low levels, with recent results showing improvement to borderline normal levels.    SUMMARY OF HEMATOLOGIC HISTORY:  She was admitted on 04/22/2023 with reports of right-sided numbness as well as abnormal gait with left calf pain. MRI brain done revealing acute paramedian left pontine infarct.  BLE Dopplers were negative for DVT.   She was noted to have slurred speech with intermittent aphasia and neurology recommended DAPT for stroke felt to be due to small vessel disease.  Hospital course has been significant for issues with recurrent nausea, headaches, orthostasis as well as left shoulder pains.     She did report increasing dysesthesias on 04/29/23 and CT head was repeated and negative for extension of hemorrhage.  Neurology was consulted for input due to reports of 2 staring episodes of unresponsiveness followed by stiffness of LUE and question of seizure.  Long-term EEG was negative for seizure and Keppra  was increased to 750 mg 3 times daily per Dr. Rosemarie.  Neurology pursued hypercoagulable workup on 04/23/2023.  Prothrombin gene mutation, factor V Leiden mutation, beta-2  glycoprotein antibodies, anticardiolipin antibodies and lupus anticoagulant were all negative.  Protein C activity, Antithrombin III  activity were within normal limits.  Protein S activity was normal at 68.  Total protein S was slightly decreased at 48%.   Repeat protein S activity was normal on 04/30/2023 at 76%, free protein S level was also normal at 83%.  Total protein as was still slightly decreased at 51%.  Hence neurology suggested referral to hematology service for further evaluation.   She has a history of a superficial clot in the right hand, presents with bilateral leg swelling, which is worse in the right leg. The patient reports that the swelling feels like a lot of water. The patient has tried heat and ice for the swelling and has been elevating the legs. The patient is currently on aspirin  but not on other  blood thinners.    The patient also has low protein levels in the blood, which could be contributing to the leg swelling. The patient does not drink alcohol  and does not report any blood loss. The patient does not have any chest pain or trouble breathing.  Slightly low total protein S level, with normal activity.  This can be potentially due to  oral contraceptives or recent illness. Normal protein S activity levels indicate no current increased thrombotic risk. Re-evaluation of levels is necessary as recent hospitalization may affect accuracy.  Repeat testing on 12/20/2023 showed normal total protein S of 66%, normal protein S activity of 88%.    -Rest of the hypercoagulable workup was unremarkable.  No routine indication for anticoagulation.  She can continue aspirin  81 mg daily.  I have reviewed the past medical history, past surgical history, social history and family history with the patient and they are unchanged from previous note.  ALLERGIES: She is allergic to bee venom, iodinated contrast media, other, toradol [ketorolac tromethamine], and benadryl  [diphenhydramine ].  MEDICATIONS:  Current Outpatient Medications  Medication Sig Dispense Refill   acetaminophen  (TYLENOL ) 325 MG tablet Take 2 tablets (650 mg total) by mouth every 6 (six) hours as needed for mild pain (pain score 1-3). 20 tablet 0   amLODipine  (NORVASC ) 5 MG tablet Take 1 tablet (5 mg total) by mouth daily. 90 tablet 1   ARIPiprazole  (ABILIFY ) 5 MG tablet Take 1 tablet (5 mg total) by mouth daily. 14 tablet 0   aspirin  EC 81 MG tablet Take 1 tablet (81 mg total) by mouth daily. Swallow whole. 100 tablet 0   atorvastatin  (LIPITOR) 40 MG tablet Take 1 tablet (40 mg total) by mouth daily. 30 tablet 0   baclofen (LIORESAL) 20 MG tablet Take 20 mg by mouth 2 (two) times daily as needed.     capsicum (ZOSTRIX) 0.075 % topical cream Apply topically 2 (two) times daily. Apply to shoulders--avoid eyes and broken skin 28.3 g 0   cyclobenzaprine  (FLEXERIL ) 5 MG tablet Take 1 tablet (5 mg total) by mouth 3 (three) times daily as needed for muscle spasms. 30 tablet 0   furosemide (LASIX) 20 MG tablet Take 20 mg by mouth daily.     gabapentin  (NEURONTIN ) 300 MG capsule Take 1 capsule (300 mg total) by mouth 2 (two) times daily with breakfast and with lunch 60 capsule 0    gabapentin  (NEURONTIN ) 400 MG capsule Take 1 capsule (400 mg total) by mouth at bedtime. 30 capsule 0   hydrocortisone  (CORTEF ) 5 MG tablet Take 4 tablets (20 mg) by mouth daily in the morning and 2 tablets (10 mg) in the evening over the next 3 days then go back to home dose of 2 tablets (10 mg) in the morning, and 1 tablet (5mg ) in the evening. 60 tablet 0   hydrOXYzine  (ATARAX ) 10 MG tablet Take 1 tablet (10 mg total) by mouth 3 (three) times daily as needed for anxiety. 30 tablet 0   hyoscyamine  (LEVSIN /SL) 0.125 MG SL tablet Place 1 tablet (0.125 mg total) under the tongue every 4 (four) hours as needed. 30 tablet 0   lamoTRIgine  (LAMICTAL ) 25 MG tablet Take 1 tablet (25 mg total) by mouth 2 (two) times daily. 180 tablet 3   levETIRAcetam  (KEPPRA ) 500 MG tablet Take 1 tablet (500 mg total) by mouth every 8 (eight) hours. 270 tablet 3   losartan  (COZAAR ) 25 MG tablet Take 1 tablet (25 mg total) by mouth daily. 90 tablet  1   methadone  (DOLOPHINE ) 10 MG/ML solution Take 125 mg by mouth daily.     naloxone  (NARCAN ) 0.4 MG/ML injection Inject 1 mL (0.4 mg total) into the vein as needed.     ondansetron  (ZOFRAN -ODT) 4 MG disintegrating tablet 4mg  ODT q4 hours prn nausea/vomit 20 tablet 0   pantoprazole  (PROTONIX ) 40 MG tablet Take 1 tablet (40 mg total) by mouth daily at 6 (six) AM. 30 tablet 0   polyethylene glycol (MIRALAX ) 17 g packet Take 17 g by mouth daily. 14 each 0   potassium chloride  (KLOR-CON ) 10 MEQ tablet Take 10 mEq by mouth daily.     traZODone  (DESYREL ) 50 MG tablet Take 1 tablet (50 mg total) by mouth at bedtime as needed for sleep. 30 tablet 0   VIENVA  0.1-20 MG-MCG tablet Take 1 tablet by mouth daily.     No current facility-administered medications for this visit.     REVIEW OF SYSTEMS:    Review of Systems - Oncology  All other pertinent systems were reviewed with the patient and are negative.  PHYSICAL EXAMINATION:    Onc Performance Status - 12/20/23 1600        ECOG Perf Status   ECOG Perf Status Restricted in physically strenuous activity but ambulatory and able to carry out work of a light or sedentary nature, e.g., light house work, office work      KPS SCALE   KPS % SCORE Normal activity with effort, some s/s of disease          Vitals:   12/20/23 1631  BP: 124/80  Pulse: 89  Resp: 17  Temp: 97.6 F (36.4 C)  SpO2: 96%   Filed Weights   12/20/23 1631  Weight: 213 lb 14.4 oz (97 kg)    Physical Exam Constitutional:      General: She is not in acute distress.    Appearance: Normal appearance.  HENT:     Head: Normocephalic and atraumatic.  Cardiovascular:     Rate and Rhythm: Normal rate.  Pulmonary:     Effort: Pulmonary effort is normal. No respiratory distress.  Abdominal:     General: There is no distension.  Neurological:     General: No focal deficit present.     Mental Status: She is alert and oriented to person, place, and time.  Psychiatric:        Mood and Affect: Mood normal.        Behavior: Behavior normal.      LABORATORY DATA:   I have reviewed the data as listed.  Results for orders placed or performed in visit on 12/20/23  D-dimer, quantitative  Result Value Ref Range   D-Dimer, Quant 0.44 0.00 - 0.50 ug/mL-FEU  Protime-INR  Result Value Ref Range   Prothrombin Time 12.7 11.4 - 15.2 seconds   INR 0.9 0.8 - 1.2  APTT  Result Value Ref Range   aPTT 27 24 - 36 seconds  PROTEIN S PANEL  Result Value Ref Range   Protein S Ag, Total 66 60 - 150 %   Protein S Ag, Free 94 61 - 136 %   Protein S Activity 88 63 - 140 %  CBC with Differential (Cancer Center Only)  Result Value Ref Range   WBC Count 4.5 4.0 - 10.5 K/uL   RBC 4.41 3.87 - 5.11 MIL/uL   Hemoglobin 13.4 12.0 - 15.0 g/dL   HCT 60.5 63.9 - 53.9 %   MCV 89.3 80.0 - 100.0  fL   MCH 30.4 26.0 - 34.0 pg   MCHC 34.0 30.0 - 36.0 g/dL   RDW 87.9 88.4 - 84.4 %   Platelet Count 231 150 - 400 K/uL   nRBC 0.0 0.0 - 0.2 %   Neutrophils  Relative % 51 %   Neutro Abs 2.3 1.7 - 7.7 K/uL   Lymphocytes Relative 37 %   Lymphs Abs 1.7 0.7 - 4.0 K/uL   Monocytes Relative 8 %   Monocytes Absolute 0.4 0.1 - 1.0 K/uL   Eosinophils Relative 3 %   Eosinophils Absolute 0.2 0.0 - 0.5 K/uL   Basophils Relative 1 %   Basophils Absolute 0.0 0.0 - 0.1 K/uL   Immature Granulocytes 0 %   Abs Immature Granulocytes 0.00 0.00 - 0.07 K/uL     RADIOGRAPHIC STUDIES:  No recent pertinent imaging studies available to review.  No orders of the defined types were placed in this encounter.    Future Appointments  Date Time Provider Department Center  05/29/2024  3:00 PM Georjean Darice HERO, MD [facility] None    This document was completed utilizing speech recognition software. Grammatical errors, random word insertions, pronoun errors, and incomplete sentences are an occasional consequence of this system due to software limitations, ambient noise, and hardware issues. Any formal questions or concerns about the content, text or information contained within the body of this dictation should be directly addressed to the provider for clarification.

## 2023-12-22 LAB — PROTEIN S PANEL
Protein S Activity: 88 % (ref 63–140)
Protein S Ag, Free: 94 % (ref 61–136)
Protein S Ag, Total: 66 % (ref 60–150)

## 2023-12-24 ENCOUNTER — Encounter: Payer: Self-pay | Admitting: Oncology

## 2023-12-24 NOTE — Assessment & Plan Note (Addendum)
-  During her hospitalization in January 2025, for presumed stroke, neurology pursued hypercoagulable workup on 04/23/2023.  Prothrombin gene mutation, factor V Leiden mutation, beta-2  glycoprotein antibodies, anticardiolipin antibodies and lupus anticoagulant were all negative.  Protein C activity, Antithrombin III  activity were within normal limits.  Protein S activity was normal at 68.  Total protein S was slightly decreased at 48%.  Repeat protein S activity was normal on 04/30/2023 at 76%, free protein S level was also normal at 83%.  Total protein as was still slightly decreased at 51%.  Hence neurology suggested referral to hematology service for further evaluation.  -Slightly low total protein S level, with normal activity.  This can be potentially due to oral contraceptives or recent illness. Normal protein S activity levels indicate no current increased thrombotic risk. Re-evaluation of levels is necessary as recent hospitalization may affect accuracy.  Repeat testing on 12/20/2023 showed normal total protein S of 66%, normal protein S activity of 88%.  PT, PTT, D-dimer were all within normal limits today.  CBC unremarkable.    -Rest of the hypercoagulable workup was unremarkable.  No routine indication for anticoagulation.  She can continue aspirin  81 mg daily, once cleared by GI.  Since repeat labs are unremarkable without any acute issues, she can be discharged from our office for continued follow-up with her PCP.  Please reconsult us  as necessary.

## 2024-01-23 ENCOUNTER — Telehealth: Payer: Self-pay | Admitting: Neurology

## 2024-01-23 NOTE — Telephone Encounter (Signed)
 Who's calling (name and relationship to patient) : [facility]: [facility]   Best contact number: [phone]  Provider they see: Dr.Aquino  Reason for call: [facility] called to confirm if faxed was received(sent yesterday). Deshanda will need cardiac clearance before 02/17/24. She stated she will resend today.   Call ID:      PRESCRIPTION REFILL ONLY  Name of prescription:  Pharmacy:

## 2024-01-28 NOTE — Telephone Encounter (Signed)
 The form we have is for Cardiology clearance. I wrote on the form that they need to contact her cardiologist for cardiac clearance, I am her neurologist.

## 2024-01-29 NOTE — Telephone Encounter (Signed)
 Paperwork faxed to [facility] so they could send it to her cardiologist

## 2024-02-12 ENCOUNTER — Telehealth (HOSPITAL_BASED_OUTPATIENT_CLINIC_OR_DEPARTMENT_OTHER): Payer: Self-pay | Admitting: *Deleted

## 2024-02-12 ENCOUNTER — Encounter: Payer: Self-pay | Admitting: Neurology

## 2024-02-12 NOTE — Telephone Encounter (Signed)
   Pre-operative Risk Assessment    Patient Name: Brittany Mullins  DOB: [birth year] MRN: [id number]   Date of last office visit: 07/18/23 DR. SKAINS Date of next office visit: NONE   Request for Surgical Clearance    Procedure:  ENDOSCOPY  Date of Surgery:  Clearance 02/17/24                                Surgeon:  DR. AMELIE Surgeon's Group or Practice Name:  [facility]  Phone number:  [phone] Fax number:  [phone]   Type of Clearance Requested:   - Medical ; PT IS ON ASA THOUGH NOT NOTED IF NEEDS TO BE HELD   Type of Anesthesia:  PROPOFOL     Additional requests/questions:    Bonney Niels Jest   02/12/2024, 10:22 AM

## 2024-02-12 NOTE — Telephone Encounter (Signed)
 Pt has been added on ok per Dayna Dunn, [facility] 02/13/24, due to procedure date is 02/17/24.   Med rec and consent are done.    Will update all parties involved to see notes from Dayna Dunn, [facility].   Per Raphael Bring, PAC: when you talk to the patient just let her know that if they are asking her to hold her aspirin , she needs to have her surgeon communicate with neurology and hematology (on this for hx of stroke, protein S deficiency neither of which we see her for)

## 2024-02-12 NOTE — Telephone Encounter (Signed)
   Name: Brittany Mullins  DOB: Mar 06, 1981  MRN: [id number]  Primary Cardiologist: Oneil Parchment, MD  Time sensitive - needs to be completed prior to surgical date. Preoperative team, please contact this patient and set up a phone call appointment for further preoperative risk assessment. Please obtain consent and complete medication review. Thank you for your help.  Regarding aspirin , *if* needing to be held, please let surgeon know we would defer to her neurologist and hematology team regarding holding due to history of stroke and protein S deficiency.  I also confirmed the patient resides in the state of [place] . As per [facility] Medical Board telemedicine laws, the patient must reside in the state in which the provider is licensed.   Raphael LOISE Bring, PA-C 02/12/2024, 1:00 PM [facility]

## 2024-02-12 NOTE — Telephone Encounter (Signed)
 Pt has been added on ok per Dayna Dunn, [facility] 02/13/24, due to procedure date is 02/17/24.    Med rec and consent are done.     Will update all parties involved to see notes from Dayna Dunn, [facility].    Per Raphael Bring, PAC: when you talk to the patient just let her know that if they are asking her to hold her aspirin , she needs to have her surgeon communicate with neurology and hematology (on this for hx of stroke, protein S deficiency neither of which we see her for)       Patient Consent for Virtual Visit        Brittany Mullins has provided verbal consent on 02/12/2024 for a virtual visit (video or telephone).   CONSENT FOR VIRTUAL VISIT FOR:  Brittany Mullins  By participating in this virtual visit I agree to the following:  I hereby voluntarily request, consent and authorize [facility] and its employed or contracted physicians, physician assistants, nurse practitioners or other licensed health care professionals (the Practitioner), to provide me with telemedicine health care services (the "Services) as deemed necessary by the treating Practitioner. I acknowledge and consent to receive the Services by the Practitioner via telemedicine. I understand that the telemedicine visit will involve communicating with the Practitioner through live audiovisual communication technology and the disclosure of certain medical information by electronic transmission. I acknowledge that I have been given the opportunity to request an in-person assessment or other available alternative prior to the telemedicine visit and am voluntarily participating in the telemedicine visit.  I understand that I have the right to withhold or withdraw my consent to the use of telemedicine in the course of my care at any time, without affecting my right to future care or treatment, and that the Practitioner or I may terminate the telemedicine visit at any time. I understand that I have the right to  inspect all information obtained and/or recorded in the course of the telemedicine visit and may receive copies of available information for a reasonable fee.  I understand that some of the potential risks of receiving the Services via telemedicine include:  Delay or interruption in medical evaluation due to technological equipment failure or disruption; Information transmitted may not be sufficient (e.g. poor resolution of images) to allow for appropriate medical decision making by the Practitioner; and/or  In rare instances, security protocols could fail, causing a breach of personal health information.  Furthermore, I acknowledge that it is my responsibility to provide information about my medical history, conditions and care that is complete and accurate to the best of my ability. I acknowledge that Practitioner's advice, recommendations, and/or decision may be based on factors not within their control, such as incomplete or inaccurate data provided by me or distortions of diagnostic images or specimens that may result from electronic transmissions. I understand that the practice of medicine is not an exact science and that Practitioner makes no warranties or guarantees regarding treatment outcomes. I acknowledge that a copy of this consent can be made available to me via my patient portal [facility] MyChart), or I can request a printed copy by calling the office of [facility].    I understand that my insurance will be billed for this visit.   I have read or had this consent read to me. I understand the contents of this consent, which adequately explains the benefits and risks of the Services being provided via telemedicine.  I have been  provided ample opportunity to ask questions regarding this consent and the Services and have had my questions answered to my satisfaction. I give my informed consent for the services to be provided through the use of telemedicine in my medical  care

## 2024-02-13 ENCOUNTER — Ambulatory Visit: Attending: Internal Medicine

## 2024-02-13 DIAGNOSIS — Z01810 Encounter for preprocedural cardiovascular examination: Secondary | ICD-10-CM

## 2024-02-13 NOTE — Progress Notes (Signed)
 Attempted to contact patient as part of preoperative protocol.  3 voice messages left over the course of several calls.  Patient will need to schedule a phone visit for different date and time.  Josefa HERO. Joselin Crandell NP-C     02/13/2024, 4:55 PM [facility] [address] [place], [place] [address] Office [phone]

## 2024-02-14 ENCOUNTER — Ambulatory Visit: Attending: General Practice

## 2024-02-14 DIAGNOSIS — Z01810 Encounter for preprocedural cardiovascular examination: Secondary | ICD-10-CM | POA: Diagnosis not present

## 2024-02-14 NOTE — Progress Notes (Signed)
 Virtual Visit via Telephone Note   Because of Brittany Mullins co-morbid illnesses, she is at least at moderate risk for complications without adequate follow up.  This format is felt to be most appropriate for this patient at this time.  Due to technical limitations with video connection (technology), today's appointment will be conducted as an audio only telehealth visit, and Brittany Mullins verbally agreed to proceed in this manner.   All issues noted in this document were discussed and addressed.  No physical exam could be performed with this format.  Evaluation Performed:  Preoperative cardiovascular risk assessment _____________   Date:  02/14/2024   Patient ID:  Brittany Mullins, DOB 10-08-1980, MRN [id number] Patient Location:  Home Provider location:   Office  Primary Care Provider:  Alec House, MD Primary Cardiologist:  Oneil Parchment, MD  Chief Complaint / Patient Profile   43 y.o. y/o female with a h/o CVA, hypertension, seizure who is pending endoscopy and presents today for telephonic preoperative cardiovascular risk assessment.  History of Present Illness    Brittany Mullins is a 43 y.o. female who presents via audio/video conferencing for a telehealth visit today.  Pt was last seen in cardiology clinic on 07/18/2023 by Dr. Parchment.  At that time Brittany Mullins was doing well .  The patient is now pending procedure as outlined above. Since her last visit, she continues to be stable from a cardiac standpoint.  Today she denies chest pain, shortness of breath, lower extremity edema, fatigue, palpitations, melena, hematuria, hemoptysis, diaphoresis, weakness, presyncope, syncope, orthopnea, and PND.   Past Medical History    Past Medical History:  Diagnosis Date   Addison's disease (HCC)    Brain tumor (benign) (HCC)    Chronic pancreatitis (HCC)    Hearing loss    Hypertension    Marijuana use    Per pt: medical  marijuana patient   Pancreatitis    PCOS (polycystic ovarian syndrome)    Pituitary tumor    Seizures (HCC)    Past Surgical History:  Procedure Laterality Date   ABDOMINAL HYSTERECTOMY     APPENDECTOMY     BIOPSY  08/16/2021   Procedure: BIOPSY;  Surgeon: San Sandor GAILS, DO;  Location: [facility];  Service: Gastroenterology;;   CESAREAN SECTION     CHOLECYSTECTOMY     ELBOW SURGERY     ESOPHAGOGASTRODUODENOSCOPY (EGD) WITH PROPOFOL  N/A 08/16/2021   Procedure: ESOPHAGOGASTRODUODENOSCOPY (EGD) WITH PROPOFOL ;  Surgeon: San Sandor GAILS, DO;  Location: [facility];  Service: Gastroenterology;  Laterality: N/A;   kidney stent     pancreatic stent     RADIOLOGY WITH ANESTHESIA N/A 04/25/2023   Procedure: MRI of Brain without contrast;  Surgeon: Radiologist, Medication, MD;  Location: [facility] OR;  Service: Radiology;  Laterality: N/A;   RADIOLOGY WITH ANESTHESIA N/A 05/03/2023   Procedure: MRI WITH ANESTHESIA CERVICAL SPINE WITHOUT CONTRAST;  Surgeon: Radiologist, Medication, MD;  Location: [facility] OR;  Service: Radiology;  Laterality: N/A;    Allergies  Allergies  Allergen Reactions   Bee Venom Anaphylaxis   Iodinated Contrast Media Anaphylaxis   Other Anaphylaxis    CT dye   Toradol [Ketorolac Tromethamine] Shortness Of Breath   Benadryl  [Diphenhydramine ] Other (See Comments)    States she cannot breathe    Home Medications    Prior to Admission medications   Medication Sig Start Date End Date Taking? Authorizing Provider  acetaminophen  (TYLENOL ) 325 MG tablet Take 2 tablets (650 mg total) by  mouth every 6 (six) hours as needed for mild pain (pain score 1-3). 05/16/23   Love, Sharlet RAMAN, PA-C  amLODipine  (NORVASC ) 5 MG tablet Take 1 tablet (5 mg total) by mouth daily. 06/18/23     ARIPiprazole  (ABILIFY ) 5 MG tablet Take 1 tablet (5 mg total) by mouth daily. 07/31/23   Jerral Meth, MD  aspirin  EC 81 MG tablet Take 1 tablet (81 mg total) by mouth daily. Swallow whole. 05/16/23    Love, Sharlet RAMAN, PA-C  atorvastatin  (LIPITOR) 40 MG tablet Take 1 tablet (40 mg total) by mouth daily. 05/16/23   Love, Sharlet RAMAN, PA-C  baclofen (LIORESAL) 20 MG tablet Take 20 mg by mouth 2 (two) times daily as needed. 06/05/23   [provider]  capsicum (ZOSTRIX) 0.075 % topical cream Apply topically 2 (two) times daily. Apply to shoulders--avoid eyes and broken skin 05/16/23   Love, Sharlet RAMAN, PA-C  cyclobenzaprine  (FLEXERIL ) 5 MG tablet Take 1 tablet (5 mg total) by mouth 3 (three) times daily as needed for muscle spasms. 05/16/23   Love, Sharlet RAMAN, PA-C  furosemide (LASIX) 20 MG tablet Take 20 mg by mouth daily. 07/05/23   [provider]  gabapentin  (NEURONTIN ) 300 MG capsule Take 1 capsule (300 mg total) by mouth 2 (two) times daily with breakfast and with lunch 05/16/23   Love, Pamela S, PA-C  gabapentin  (NEURONTIN ) 400 MG capsule Take 1 capsule (400 mg total) by mouth at bedtime. 05/16/23   Love, Sharlet RAMAN, PA-C  hydrocortisone  (CORTEF ) 5 MG tablet Take 4 tablets (20 mg) by mouth daily in the morning and 2 tablets (10 mg) in the evening over the next 3 days then go back to home dose of 2 tablets (10 mg) in the morning, and 1 tablet (5mg ) in the evening. 05/22/23   Elgergawy, Brayton RAMAN, MD  hydrOXYzine  (ATARAX ) 10 MG tablet Take 1 tablet (10 mg total) by mouth 3 (three) times daily as needed for anxiety. 05/16/23   Love, Sharlet RAMAN, PA-C  hyoscyamine  (LEVSIN AMIEL) 0.125 MG SL tablet Place 1 tablet (0.125 mg total) under the tongue every 4 (four) hours as needed. 08/20/23   Haze Lonni PARAS, MD  lamoTRIgine  (LAMICTAL ) 25 MG tablet Take 1 tablet (25 mg total) by mouth 2 (two) times daily. 10/23/23   Georjean Darice HERO, MD  levETIRAcetam  (KEPPRA ) 500 MG tablet Take 1 tablet (500 mg total) by mouth every 8 (eight) hours. 10/23/23   Georjean Darice HERO, MD  losartan  (COZAAR ) 25 MG tablet Take 1 tablet (25 mg total) by mouth daily. 06/18/23     methadone  (DOLOPHINE ) 10 MG/ML solution Take 125 mg by mouth  daily.    [provider]  naloxone  (NARCAN ) 0.4 MG/ML injection Inject 1 mL (0.4 mg total) into the vein as needed. 05/01/23   Ezenduka, Nkeiruka J, MD  ondansetron  (ZOFRAN -ODT) 4 MG disintegrating tablet 4mg  ODT q4 hours prn nausea/vomit 08/20/23   Pollina, Lonni PARAS, MD  pantoprazole  (PROTONIX ) 40 MG tablet Take 1 tablet (40 mg total) by mouth daily at 6 (six) AM. 05/22/23   Elgergawy, Brayton RAMAN, MD  polyethylene glycol (MIRALAX ) 17 g packet Take 17 g by mouth daily. 08/20/23   Haze Lonni PARAS, MD  traZODone  (DESYREL ) 50 MG tablet Take 1 tablet (50 mg total) by mouth at bedtime as needed for sleep. 05/17/23   Love, Sharlet RAMAN, PA-C  VIENVA  0.1-20 MG-MCG tablet Take 1 tablet by mouth daily. 03/21/23   [provider]    Physical  Exam    Vital Signs:  Brittany Mullins does not have vital signs available for review today.  Given telephonic nature of communication, physical exam is limited. AAOx3. NAD. Normal affect.  Speech and respirations are unlabored.  Accessory Clinical Findings    None  Assessment & Plan    1.  Preoperative Cardiovascular Risk Assessment: ENDOSCOPY   Date of Surgery:  Clearance 02/17/24                                  Surgeon:  DR. AMELIE Surgeon's Group or Practice Name:  [facility]  Phone number:  [phone] Fax number:  [phone]      Primary Cardiologist: Oneil Parchment, MD  Chart reviewed as part of pre-operative protocol coverage. Given past medical history and time since last visit, based on ACC/AHA guidelines, Brittany Mullins would be at acceptable risk for the planned procedure without further cardiovascular testing.   Patient was advised that if she develops new symptoms prior to surgery to contact our office to arrange a follow-up appointment.  She verbalized understanding.  Patient's aspirin  is not prescribed by cardiology.  Recommendations for holding aspirin  will need to come  from prescribing provider.  I will route this recommendation to the requesting party via Epic fax function and remove from pre-op pool.      Time:   Today, I have spent 5 minutes with the patient with telehealth technology discussing medical history, symptoms, and management plan.  I spent 10 minutes reviewing patient's past cardiac history and cardiac medications.    Brittany CHRISTELLA Beauvais, NP  02/14/2024, 9:21 AM
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
## 2024-02-14 NOTE — Telephone Encounter (Signed)
 Patient returned Pre-op call.

## 2024-02-17 NOTE — H&P (Signed)
 [facility]  History and Physical   Assessment  43 year old female presents today for an endoscopy due to symptoms of dysphagia  Plan  Endoscopy today  History  Brittany Mullins is a 43 year old female previously followed by Dr. Frutoso presents today for an endoscopy due to symptoms of dysphagia.  The patient has a history of protein C&S deficiency and has a history of a CVA in the January 2025.  She had complained about solid food dysphagia at the time of her last visit with Dr. Frutoso in September 2025.  She complained of nausea and vomiting as well.  She reported occasional heartburn and symptoms of GERD and was using Tums.  She was started on promethazine  for symptoms of nausea.  She has also been on pantoprazole  40 mg daily.  Past Medical History:  Diagnosis Date  . Anesthesia complication    early emergence with gallbladder surgery  . Seizure (*)   . Stroke (*) 05/2023   right side weakness  . Wears dentures    upper   Past Surgical History:  Procedure Laterality Date  . Appendectomy    . Cholecystectomy    . Colonoscopy     unknown  . Upper gastrointestinal endoscopy     unknown    Allergies[1] Prior to Admission medications  Medication Sig Start Date End Date Taking? Authorizing Provider  acetaminophen  (TYLENOL ) 325 mg tablet Take two tablets (650 mg dose) by mouth every 6 (six) hours as needed. 05/16/23   Historical Provider, MD  amLODIPine  besylate (NORVASC ) 5 mg tablet Take one tablet (5 mg dose) by mouth daily. 06/18/23  Yes Historical Provider, MD  ARIPiprazole  (ABILIFY ) 5 mg tablet Take one tablet (5 mg dose) by mouth daily. 02/07/24  Yes Historical Provider, MD  aspirin  (ECOTRIN LOW DOSE) EC tablet Take one tablet (81 mg dose) by mouth daily. As needed 05/16/23   Historical Provider, MD  atorvastatin  (LIPITOR) 40 mg tablet Take one tablet (40 mg dose) by mouth daily. 05/16/23  Yes Historical Provider, MD  baclofen (LIORESAL) 20  mg tablet Take one tablet (20 mg dose) by mouth 2 (two) times a day as needed. 06/05/23  Yes Historical Provider, MD  capsaicin  (ZOSTRIX-HP) 0.075% topical cream Apply topically. 05/16/23  Yes Historical Provider, MD  cyclobenzaprine  (FLEXERIL ) 5 mg tablet Take one tablet (5 mg dose) by mouth daily as needed for Muscle spasms. 05/16/23  Yes Historical Provider, MD  furosemide (LASIX) 20 mg tablet Take one tablet (20 mg dose) by mouth daily.   Yes Historical Provider, MD  gabapentin  (NEURONTIN ) 300 mg capsule Take one capsule (300 mg dose) by mouth. 300 mg BID 400mg  QHS 05/16/23  Yes Historical Provider, MD  hydrocortisone  (CORTEF ) 5 mg tablet Take 4 tablets (20 mg) by mouth daily in the morning and 2 tablets (10 mg) in the evening over the next 3 days then go back to home dose of 2 tablets (10 mg) in the morning, and 1 tablet (5mg ) in the evening. 05/22/23  Yes Historical Provider, MD  hydrOXYzine  HCl (ATARAX ) 10 mg tablet Take one tablet (10 mg dose) by mouth daily as needed for Anxiety. 05/16/23  Yes Historical Provider, MD  ibuprofen  (ADVIL ,MOTRIN ) 800 mg tablet Take one tablet (800 mg dose) by mouth every 6 (six) hours as needed. 01/29/24   Historical Provider, MD  lamoTRIgine  (LAMICTAL ) 25 mg tablet Take one tablet (25 mg dose) by mouth 2 (two) times daily. 05/16/23  Yes Historical Provider, MD  levETIRAcetam  (KEPPRA )  500 mg tablet Take one tablet (500 mg dose) by mouth every 8 (eight) hours. 05/16/23  Yes Historical Provider, MD  levonorgestrel -ethinyl estradiol  (VIENVA ) 0.1-20 MG-MCG per tablet Take one tablet by mouth daily. 03/21/23  Yes Historical Provider, MD  losartan  potassium (COZAAR ) 25 mg tablet Take one tablet (25 mg dose) by mouth daily. 02/04/24  Yes Historical Provider, MD  methadone  (DOLOPHINE ) 10 mg/5 mL solution 130mg  at  0500   Yes Historical Provider, MD  naloxone  (NARCAN ) 0.4 MG/ML injection Inject 1 mL (0.4 mg dose) into the vein as needed. 05/01/23   Historical Provider, MD   pantoprazole  sodium (PROTONIX ) 40 mg tablet Take one tablet (40 mg dose) by mouth daily. 05/22/23   Historical Provider, MD  polyethylene glycol (MIRALAX ) 17 g packet Take 120 mLs (17 g dose) by mouth daily as needed. 03/21/23   Historical Provider, MD  POTASSIUM CHLORIDE  10 mEq tablet 1 tablet after a meal Orally once per day with use of fluid pill for 90 days 07/05/23 01/31/24  Historical Provider, MD  predniSONE  (DELTASONE ) 50 mg tablet Take one tablet (50 mg dose) by mouth daily.    Historical Provider, MD  promethazine  (PHENERGAN ) 12.5 MG tablet Take one tablet (12.5 mg dose) by mouth every 6 (six) hours as needed for Nausea. Patient not taking: Reported on 01/31/2024 12/24/23   Rosalynn Pottier, MD  Sennosides 8.6 MG CAPS 2 capsules at bedtime as needed Orally Once a day    Historical Provider, MD  traZODone  (DESYREL ) 50 mg tablet Take one tablet (50 mg dose) by mouth at bedtime as needed for Sleep. 05/17/23  Yes Historical Provider, MD   Social History[2] Family History  Problem Relation Age of Onset  . Colon polyps Neg Hx   . Colon cancer Neg Hx    Review of Systems  Constitutional:  Negative for diaphoresis, fatigue, fever and unexpected weight change.  HENT:  Positive for trouble swallowing.   Respiratory:  Negative for cough, shortness of breath, wheezing and stridor.   Cardiovascular:  Negative for chest pain.  Gastrointestinal:  Positive for nausea and vomiting. Negative for abdominal pain, anal bleeding, blood in stool, constipation, diarrhea and rectal pain.         Physical Examination  Temp:  [97.8 F (36.6 C)] 97.8 F (36.6 C) Heart Rate:  [99] 99 Resp:  [18] 18 BP: (130)/(85) 130/85 SpO2:  [96 %] 96 % Pain Score: 0-No pain O2 Device: None (Room air)    Physical Exam Constitutional:      Appearance: Normal appearance.  HENT:     Nose: Nose normal.  Eyes:     Conjunctiva/sclera: Conjunctivae normal.  Cardiovascular:     Rate and Rhythm: Normal rate and regular  rhythm.     Pulses: Normal pulses.     Heart sounds: Normal heart sounds.  Musculoskeletal:     Cervical back: Normal range of motion.  Pulmonary:     Effort: Pulmonary effort is normal.     Breath sounds: Normal breath sounds.  Abdominal:     General: There is no distension.     Palpations: There is no mass.     Tenderness: There is no abdominal tenderness. There is no guarding or rebound.     Hernia: No hernia is present.  Neurological:     General: No focal deficit present.     Mental Status: She is alert.  Psychiatric:        Mood and Affect: Mood normal.   Results  Labs:  No results found for this or any previous visit (from the past 24 hours). Imaging: No results found.  Coding  Electronically signed: Onetha JINNY Larger, MD 02/17/2024 / 11:54 AM       [1] Allergies Allergen Reactions  . Bee Venom Anaphylaxis  . Benadryl  Allergy Shortness Of Breath  . Iodinated Diagnostic Agents Anaphylaxis  . Ketorolac Anaphylaxis  [2] Social History Socioeconomic History  . Marital status: Significant Other  Tobacco Use  . Smoking status: Never    Passive exposure: Never  . Smokeless tobacco: Never  Vaping Use  . Vaping status: Never Used  Substance and Sexual Activity  . Alcohol  use: Not Currently  . Drug use: Not Currently

## 2024-02-17 NOTE — Anesthesia Postprocedure Evaluation (Signed)
  Patient: Brittany Mullins Procedure(s): ESOPHAGOGASTRODUODENOSCOPY (EGD)  Anesthesia type: general  Anesthesia Post Evaluation  Patient location during evaluation: PACU Patient participation: complete - patient participated Level of consciousness: awake and alert Pain management: adequate Airway patency: patent Cardiovascular status: acceptable Respiratory status: acceptable Hydration status: acceptable Vitals: stable Temperature: temperature adequate >96.21F PONV: nausea and vomiting controlled Regional Anesthesia: no block performed    BP: 141/86 (02/17/24 1338) Heart Rate: 81 (02/17/24 1338) Resp: 16 (02/17/24 1338) Temp: 97.8 F (36.6 C) (02/17/24 1308) SpO2: 98 % (02/17/24 1338) Weight: 102 kg (224 lb 12.8 oz) (02/17/24 1128) BMI (Calculated): 39.8 (02/17/24 1128)     Notable Events:    No Anesthesia notable events documented.

## 2024-02-20 ENCOUNTER — Other Ambulatory Visit: Payer: Self-pay

## 2024-02-20 ENCOUNTER — Emergency Department (HOSPITAL_COMMUNITY)
Admission: EM | Admit: 2024-02-20 | Discharge: 2024-02-20 | Disposition: A | Discharge status: Home / Self Care | Attending: Emergency Medicine | Admitting: Emergency Medicine

## 2024-02-20 ENCOUNTER — Emergency Department (HOSPITAL_COMMUNITY)

## 2024-02-20 DIAGNOSIS — R112 Nausea with vomiting, unspecified: Secondary | ICD-10-CM | POA: Diagnosis present

## 2024-02-20 DIAGNOSIS — Z7982 Long term (current) use of aspirin: Secondary | ICD-10-CM | POA: Insufficient documentation

## 2024-02-20 DIAGNOSIS — R1031 Right lower quadrant pain: Secondary | ICD-10-CM | POA: Diagnosis not present

## 2024-02-20 DIAGNOSIS — R1011 Right upper quadrant pain: Secondary | ICD-10-CM | POA: Diagnosis not present

## 2024-02-20 DIAGNOSIS — R197 Diarrhea, unspecified: Secondary | ICD-10-CM | POA: Diagnosis not present

## 2024-02-20 DIAGNOSIS — R109 Unspecified abdominal pain: Secondary | ICD-10-CM

## 2024-02-20 LAB — CBC
HCT: 41.5 % (ref 36.0–46.0)
Hemoglobin: 13.6 g/dL (ref 12.0–15.0)
MCH: 29.8 pg (ref 26.0–34.0)
MCHC: 32.8 g/dL (ref 30.0–36.0)
MCV: 90.8 fL (ref 80.0–100.0)
Platelets: 281 K/uL (ref 150–400)
RBC: 4.57 MIL/uL (ref 3.87–5.11)
RDW: 12.5 % (ref 11.5–15.5)
WBC: 6.7 K/uL (ref 4.0–10.5)
nRBC: 0 % (ref 0.0–0.2)

## 2024-02-20 LAB — URINALYSIS, ROUTINE W REFLEX MICROSCOPIC
Bilirubin Urine: NEGATIVE
Glucose, UA: NEGATIVE mg/dL
Hgb urine dipstick: NEGATIVE
Ketones, ur: NEGATIVE mg/dL
Nitrite: NEGATIVE
Protein, ur: NEGATIVE mg/dL
Specific Gravity, Urine: 1.012 (ref 1.005–1.030)
pH: 7 (ref 5.0–8.0)

## 2024-02-20 LAB — COMPREHENSIVE METABOLIC PANEL WITH GFR
ALT: 39 U/L (ref 0–44)
AST: 57 U/L — ABNORMAL HIGH (ref 15–41)
Albumin: 3.9 g/dL (ref 3.5–5.0)
Alkaline Phosphatase: 74 U/L (ref 38–126)
Anion gap: 12 (ref 5–15)
BUN: 9 mg/dL (ref 6–20)
CO2: 25 mmol/L (ref 22–32)
Calcium: 9.5 mg/dL (ref 8.9–10.3)
Chloride: 102 mmol/L (ref 98–111)
Creatinine, Ser: 0.65 mg/dL (ref 0.44–1.00)
GFR, Estimated: >60 mL/min (ref >60)
Glucose, Bld: 52 mg/dL — ABNORMAL LOW (ref 70–99)
Potassium: 4.1 mmol/L (ref 3.5–5.1)
Sodium: 139 mmol/L (ref 135–145)
Total Bilirubin: 0.4 mg/dL (ref 0.0–1.2)
Total Protein: 7.3 g/dL (ref 6.5–8.1)

## 2024-02-20 LAB — LIPASE, BLOOD: Lipase: 15 U/L (ref 11–51)

## 2024-02-20 MED ORDER — ONDANSETRON 4 MG PO TBDP: 4.0000 mg | ORAL_TABLET | Freq: Three times a day (TID) | ORAL | 0 refills | Status: AC | PRN

## 2024-02-20 MED ORDER — ONDANSETRON 4 MG PO TBDP
4.0000 mg | ORAL_TABLET | Freq: Once | ORAL | Status: AC
  Administered 2024-02-20: 4 mg via ORAL
  Filled 2024-02-20: qty 1

## 2024-02-20 MED ORDER — OXYCODONE HCL 5 MG PO TABS
5.0000 mg | ORAL_TABLET | Freq: Once | ORAL | Status: AC
  Administered 2024-02-20: 5 mg via ORAL
  Filled 2024-02-20: qty 1

## 2024-02-20 NOTE — ED Provider Triage Note (Signed)
 Emergency Medicine Provider Triage Evaluation Note  Brittany Mullins , a 43 y.o. female  was evaluated in triage.  Pt complains of RUQ abd pain that started 11/18 following taking sucrafate. Last taken yesterday. Vomited 10x. Diarrhea 5x. +passing gas S/p EGD 02/17/24  Review of Systems  Positive: See HPI Negative:   Physical Exam  BP (!) 151/102 (BP Location: Left Arm)   Pulse 86   Temp 98.4 F (36.9 C) (Oral)   Resp 16   Ht 5' 3 (1.6 m)   Wt 99.8 kg   SpO2 96%   BMI 38.97 kg/m  Gen:   Awake, no distress   Resp:  Normal effort  MSK:   Moves extremities without difficulty  Other:    Medical Decision Making  Medically screening exam initiated at 7:20 PM.  Appropriate orders placed.  Brittany Mullins was informed that the remainder of the evaluation will be completed by another provider, this initial triage assessment does not replace that evaluation, and the importance of remaining in the ED until their evaluation is complete.  Labs and zofran  odt ordered   Brittany Mullins, Brittany Mullins 02/20/24 1921

## 2024-02-20 NOTE — Discharge Instructions (Signed)
 It was a pleasure taking part in your care.  As we discussed, your workup here including your CT scan was reassuring.  Please follow-up with your GI team at your earliest convenience.  You may take Zofran  every 6 hours as needed for nausea.  Please continue to take Carafate  if you decide to do so.  I feel this would be of benefit.

## 2024-02-20 NOTE — ED Provider Notes (Signed)
  Physical Exam  BP (!) 129/99   Pulse 75   Temp 98 F (36.7 C) (Oral)   Resp 16   Ht 5' 3 (1.6 m)   Wt 99.8 kg   SpO2 95%   BMI 38.97 kg/m   Physical Exam  Procedures  Procedures  ED Course / MDM   Clinical Course as of 02/20/24 2329  Thu Feb 20, 2024  2225 43yof, persistent nausea and vomiting. Began sucralfate  2 days after procedure. Began having RUQ cramping and pain. Nausea and vomiting. Last took sucrafate yesterday.No fevers. Yes diarrhea.  [CG]  2227 Follow up on CT, has not thrown up since being in room, PO fluid challenge.  [CG]    Clinical Course User Index [CG] Ruthell Lonni FALCON, PA-C   Medical Decision Making Amount and/or Complexity of Data Reviewed Labs: ordered. Radiology: ordered.   Patient signed out to me at shift change pending CT abdomen pelvis, reassessment.  Please see the previous provider note for further details.  In short, 43 year old female presenting with nausea, vomiting abdominal pain.  Patient labwork is grossly unremarkable.  Signed out to me pending CT abdomen pelvis.  CT abdomen pelvis shows no acute findings.  Noted to have diverticulosis without diverticulitis.  Noted to have nonobstruction nephrolithiasis bilaterally.  Patient will be discharged home at this time.  She has passed a p.o. fluid challenge.  She was advised to follow-up with her GI team at her earliest convenience and she voiced understanding.  She was given opportunity to ask questions and all questions answered to the patient satisfaction.  Stable to discharge at this time.       Ruthell Lonni FALCON, PA-C 02/20/24 2332    Palumbo, April, MD 02/20/24 2353

## 2024-02-20 NOTE — ED Triage Notes (Signed)
 Patient RUQ abdominal pain x 2 days. Patient report pain started after taking Sucralfate  that was prescribe to her last Tuesday. Patient report nausea and vomiting x 10 today. Patient denies chest pain and SOB.

## 2024-02-20 NOTE — ED Provider Notes (Signed)
 [place] [facility] DEPARTMENT AT [facility] Provider Note   CSN: [id number] Arrival date & time: 02/20/24  1726     Patient presents with: Abdominal Pain   Brittany Mullins is a 43 y.o. female.   Patient is here for evaluation of persistent nausea and vomiting x 2 days.  Patient had EGD study on November 17 with esophageal dilation.  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  She was placed on sucralfate  after this procedure.  She started this new medication on November 18.  Since that time she has had right upper quadrant cramping/pain as well as persistent nausea and vomiting.  She reports she has vomited 10 times today.  She has not eaten today.  She also reports diarrhea that began on November 18 as well.  She denies dizziness or syncope.  Denies shortness of breath, chest pain or fevers.  No one around her has had similar symptoms. Patient was not able to take daily medications due to symptoms. She last took sucralfate  yesterday. She is accompanied by her fianc today.  The history is provided by the patient.  Abdominal Pain Pain location:  RUQ      Prior to Admission medications   Medication Sig Start Date End Date Taking? Authorizing Provider  acetaminophen  (TYLENOL ) 325 MG tablet Take 2 tablets (650 mg total) by mouth every 6 (six) hours as needed for mild pain (pain score 1-3). 05/16/23   Love, Sharlet RAMAN, PA-C  amLODipine  (NORVASC ) 5 MG tablet Take 1 tablet (5 mg total) by mouth daily. 06/18/23     ARIPiprazole  (ABILIFY ) 5 MG tablet Take 1 tablet (5 mg total) by mouth daily. 07/31/23   Jerral Meth, MD  aspirin  EC 81 MG tablet Take 1 tablet (81 mg total) by mouth daily. Swallow whole. 05/16/23   Love, Sharlet RAMAN, PA-C  atorvastatin  (LIPITOR) 40 MG tablet Take 1 tablet (40 mg total) by mouth daily. 05/16/23   Love, Sharlet RAMAN, PA-C  baclofen (LIORESAL) 20 MG tablet Take 20 mg by mouth 2 (two) times daily as needed. 06/05/23   [provider]   capsicum (ZOSTRIX) 0.075 % topical cream Apply topically 2 (two) times daily. Apply to shoulders--avoid eyes and broken skin 05/16/23   Love, Sharlet RAMAN, PA-C  cyclobenzaprine  (FLEXERIL ) 5 MG tablet Take 1 tablet (5 mg total) by mouth 3 (three) times daily as needed for muscle spasms. 05/16/23   Love, Sharlet RAMAN, PA-C  furosemide (LASIX) 20 MG tablet Take 20 mg by mouth daily. 07/05/23   [provider]  gabapentin  (NEURONTIN ) 300 MG capsule Take 1 capsule (300 mg total) by mouth 2 (two) times daily with breakfast and with lunch 05/16/23   Love, Sharlet RAMAN, PA-C  gabapentin  (NEURONTIN ) 400 MG capsule Take 1 capsule (400 mg total) by mouth at bedtime. 05/16/23   Love, Sharlet RAMAN, PA-C  hydrocortisone  (CORTEF ) 5 MG tablet Take 4 tablets (20 mg) by mouth daily in the morning and 2 tablets (10 mg) in the evening over the next 3 days then go back to home dose of 2 tablets (10 mg) in the morning, and 1 tablet (5mg ) in the evening. 05/22/23   Elgergawy, Brayton RAMAN, MD  hydrOXYzine  (ATARAX ) 10 MG tablet Take 1 tablet (10 mg total) by mouth 3 (three) times daily as needed for anxiety. 05/16/23   Love, Sharlet RAMAN, PA-C  hyoscyamine  (LEVSIN AMIEL) 0.125 MG SL tablet Place 1 tablet (0.125 mg total) under the tongue every 4 (four) hours as  needed. 08/20/23   Haze Lonni PARAS, MD  lamoTRIgine  (LAMICTAL ) 25 MG tablet Take 1 tablet (25 mg total) by mouth 2 (two) times daily. 10/23/23   Georjean Darice HERO, MD  levETIRAcetam  (KEPPRA ) 500 MG tablet Take 1 tablet (500 mg total) by mouth every 8 (eight) hours. 10/23/23   Georjean Darice HERO, MD  losartan  (COZAAR ) 25 MG tablet Take 1 tablet (25 mg total) by mouth daily. 06/18/23     methadone  (DOLOPHINE ) 10 MG/ML solution Take 125 mg by mouth daily.    [provider]  naloxone  (NARCAN ) 0.4 MG/ML injection Inject 1 mL (0.4 mg total) into the vein as needed. 05/01/23   Ezenduka, Nkeiruka J, MD  ondansetron  (ZOFRAN -ODT) 4 MG disintegrating tablet 4mg  ODT q4 hours prn nausea/vomit  08/20/23   Pollina, Lonni PARAS, MD  pantoprazole  (PROTONIX ) 40 MG tablet Take 1 tablet (40 mg total) by mouth daily at 6 (six) AM. 05/22/23   Elgergawy, Brayton RAMAN, MD  polyethylene glycol (MIRALAX ) 17 g packet Take 17 g by mouth daily. 08/20/23   Haze Lonni PARAS, MD  traZODone  (DESYREL ) 50 MG tablet Take 1 tablet (50 mg total) by mouth at bedtime as needed for sleep. 05/17/23   Love, Sharlet RAMAN, PA-C  VIENVA  0.1-20 MG-MCG tablet Take 1 tablet by mouth daily. 03/21/23   [provider]    Allergies: Bee venom, Iodinated contrast media, Other, Toradol [ketorolac tromethamine], and Benadryl  [diphenhydramine ]    Review of Systems  Gastrointestinal:  Positive for abdominal pain.    Updated Vital Signs BP (!) 151/102 (BP Location: Left Arm)   Pulse 86   Temp 98.4 F (36.9 C) (Oral)   Resp 16   Ht 5' 3 (1.6 m)   Wt 99.8 kg   SpO2 96%   BMI 38.97 kg/m   Physical Exam Vitals and nursing note reviewed.  Constitutional:      General: She is not in acute distress.    Appearance: Normal appearance. She is well-developed. She is not diaphoretic.  HENT:     Head: Normocephalic and atraumatic.     Mouth/Throat:     Mouth: Mucous membranes are moist.  Eyes:     General: No scleral icterus.    Extraocular Movements: Extraocular movements intact.     Conjunctiva/sclera: Conjunctivae normal.  Cardiovascular:     Rate and Rhythm: Normal rate and regular rhythm.     Pulses: Normal pulses.     Heart sounds: Normal heart sounds.  Pulmonary:     Effort: Pulmonary effort is normal. No respiratory distress.     Breath sounds: Normal breath sounds. No stridor. No wheezing, rhonchi or rales.  Abdominal:     General: Abdomen is flat. Bowel sounds are increased. There is no distension.     Palpations: Abdomen is soft.     Tenderness: There is abdominal tenderness in the right upper quadrant and right lower quadrant. There is guarding. There is no rebound. Negative signs include  Rovsing's sign and McBurney's sign.  Skin:    General: Skin is warm and dry.     Capillary Refill: Capillary refill takes less than 2 seconds.     Coloration: Skin is not jaundiced or pale.  Neurological:     Mental Status: She is alert and oriented to person, place, and time.     (all labs ordered are listed, but only abnormal results are displayed) Labs Reviewed  COMPREHENSIVE METABOLIC PANEL WITH GFR - Abnormal; Notable for the following components:  Result Value   Glucose, Bld 52 (*)    AST 57 (*)    All other components within normal limits  URINALYSIS, ROUTINE W REFLEX MICROSCOPIC - Abnormal; Notable for the following components:   Leukocytes,Ua TRACE (*)    Bacteria, UA MANY (*)    All other components within normal limits  LIPASE, BLOOD  CBC    EKG: None  Radiology: No results found.  Procedures   Medications Ordered in the ED  ondansetron  (ZOFRAN -ODT) disintegrating tablet 4 mg (4 mg Oral Given 02/20/24 1927)  oxyCODONE  (Oxy IR/ROXICODONE ) immediate release tablet 5 mg (5 mg Oral Given 02/20/24 1927)    Patient presents to the ED for concern of persistent nausea/vomiting with accompanying diarrhea and right upper quadrant pain, this involves an extensive number of treatment options, and is a complaint that carries with it a high risk of complications and morbidity.  The differential diagnosis includes medication reaction, gastroenteritis, biliary disease, biliary obstruction, ETOH, peptic ulcer disease, norovirus.   Co morbidities that complicate the patient evaluation  Stroke Seizures Contrast allergy   Additional history obtained:  Additional history obtained from  Family and Outside Medical Records   External records from outside source obtained and reviewed including procedural notes and history from fianc.   Lab Tests:  I Ordered, and personally interpreted labs.  The pertinent results include:  AST of 57 is the only elevated hepatic value.  No leukocytosis.   Imaging Studies ordered:  I ordered imaging studies including CT Abdomen without contrast  Imaging pending at provider sign out.   Medicines ordered and prescription drug management:  Patient was given zofran  and oxycodone  with improvement of abdominal pain and nausea. Reevaluation of the patient after these medicines showed that the patient improved   Problem List / ED Course:  Patient is here for evaluation of persistent nausea and vomiting with accompanying diarrhea and right upper quadrant pain x2 days. Labs are largely unremarkable. Imaging pending at provider signout.   Reevaluation:  After the interventions noted above, I reevaluated the patient and found that they have :improved   Dispostion:  Imaging pending at provider signout. Patient care transferred to Lonni Lites PA-C  Medical Decision Making Amount and/or Complexity of Data Reviewed Labs: ordered.     Final diagnoses:  None    ED Discharge Orders     None          Brittany Mullins 02/20/24 2232    Dasie Faden, MD 02/20/24 2257

## 2024-04-29 ENCOUNTER — Encounter (HOSPITAL_COMMUNITY): Payer: Self-pay

## 2024-04-29 ENCOUNTER — Emergency Department (HOSPITAL_COMMUNITY)

## 2024-04-29 ENCOUNTER — Emergency Department (HOSPITAL_COMMUNITY)
Admission: EM | Admit: 2024-04-29 | Discharge: 2024-04-30 | Disposition: A | Discharge status: Home / Self Care | Attending: Emergency Medicine | Admitting: Emergency Medicine

## 2024-04-29 ENCOUNTER — Other Ambulatory Visit: Payer: Self-pay

## 2024-04-29 DIAGNOSIS — I10 Essential (primary) hypertension: Secondary | ICD-10-CM | POA: Insufficient documentation

## 2024-04-29 DIAGNOSIS — R112 Nausea with vomiting, unspecified: Secondary | ICD-10-CM | POA: Insufficient documentation

## 2024-04-29 DIAGNOSIS — R000 Tachycardia, unspecified: Secondary | ICD-10-CM | POA: Insufficient documentation

## 2024-04-29 DIAGNOSIS — Z7982 Long term (current) use of aspirin: Secondary | ICD-10-CM | POA: Diagnosis not present

## 2024-04-29 DIAGNOSIS — Z79899 Other long term (current) drug therapy: Secondary | ICD-10-CM | POA: Insufficient documentation

## 2024-04-29 DIAGNOSIS — N3000 Acute cystitis without hematuria: Secondary | ICD-10-CM | POA: Diagnosis not present

## 2024-04-29 DIAGNOSIS — Z8673 Personal history of transient ischemic attack (TIA), and cerebral infarction without residual deficits: Secondary | ICD-10-CM | POA: Diagnosis not present

## 2024-04-29 DIAGNOSIS — R1084 Generalized abdominal pain: Secondary | ICD-10-CM | POA: Insufficient documentation

## 2024-04-29 LAB — COMPREHENSIVE METABOLIC PANEL WITH GFR
ALT: 25 U/L (ref 0–44)
AST: 42 U/L — ABNORMAL HIGH (ref 15–41)
Albumin: 4.5 g/dL (ref 3.5–5.0)
Alkaline Phosphatase: 79 U/L (ref 38–126)
Anion gap: 16 — ABNORMAL HIGH (ref 5–15)
BUN: 7 mg/dL (ref 6–20)
CO2: 27 mmol/L (ref 22–32)
Calcium: 9.6 mg/dL (ref 8.9–10.3)
Chloride: 99 mmol/L (ref 98–111)
Creatinine, Ser: 0.71 mg/dL (ref 0.44–1.00)
GFR, Estimated: >60 mL/min
Glucose, Bld: 70 mg/dL (ref 70–99)
Potassium: 3.6 mmol/L (ref 3.5–5.1)
Sodium: 142 mmol/L (ref 135–145)
Total Bilirubin: 0.5 mg/dL (ref 0.0–1.2)
Total Protein: 8 g/dL (ref 6.5–8.1)

## 2024-04-29 LAB — CBC WITH DIFFERENTIAL/PLATELET
Abs Immature Granulocytes: 0.02 10*3/uL (ref 0.00–0.07)
Basophils Absolute: 0.1 10*3/uL (ref 0.0–0.1)
Basophils Relative: 1 %
Eosinophils Absolute: 0 10*3/uL (ref 0.0–0.5)
Eosinophils Relative: 0 %
HCT: 44.4 % (ref 36.0–46.0)
Hemoglobin: 14.7 g/dL (ref 12.0–15.0)
Immature Granulocytes: 0 %
Lymphocytes Relative: 24 %
Lymphs Abs: 1.6 10*3/uL (ref 0.7–4.0)
MCH: 29.5 pg (ref 26.0–34.0)
MCHC: 33.1 g/dL (ref 30.0–36.0)
MCV: 89 fL (ref 80.0–100.0)
Monocytes Absolute: 0.5 10*3/uL (ref 0.1–1.0)
Monocytes Relative: 7 %
Neutro Abs: 4.6 10*3/uL (ref 1.7–7.7)
Neutrophils Relative %: 68 %
Platelets: 287 10*3/uL (ref 150–400)
RBC: 4.99 MIL/uL (ref 3.87–5.11)
RDW: 12.1 % (ref 11.5–15.5)
WBC: 6.7 10*3/uL (ref 4.0–10.5)
nRBC: 0 % (ref 0.0–0.2)

## 2024-04-29 LAB — URINALYSIS, ROUTINE W REFLEX MICROSCOPIC
Bilirubin Urine: NEGATIVE
Glucose, UA: NEGATIVE mg/dL
Hgb urine dipstick: NEGATIVE
Ketones, ur: 20 mg/dL — AB
Nitrite: NEGATIVE
Protein, ur: 30 mg/dL — AB
Specific Gravity, Urine: 1.019 (ref 1.005–1.030)
pH: 7 (ref 5.0–8.0)

## 2024-04-29 LAB — I-STAT CG4 LACTIC ACID, ED: Lactic Acid, Venous: 1.5 mmol/L (ref 0.5–1.9)

## 2024-04-29 LAB — LIPASE, BLOOD: Lipase: 31 U/L (ref 11–51)

## 2024-04-29 MED ORDER — ONDANSETRON HCL 4 MG/2ML IJ SOLN
4.0000 mg | Freq: Once | INTRAMUSCULAR | Status: AC | PRN
  Administered 2024-04-29: 4 mg via INTRAVENOUS
  Filled 2024-04-29: qty 2

## 2024-04-29 MED ORDER — SODIUM CHLORIDE 0.9 % IV SOLN
25.0000 mg | Freq: Once | INTRAVENOUS | Status: AC
  Administered 2024-04-29: 25 mg via INTRAVENOUS
  Filled 2024-04-29: qty 1

## 2024-04-29 MED ORDER — SODIUM CHLORIDE 0.9% FLUSH
3.0000 mL | Freq: Once | INTRAVENOUS | Status: AC
  Administered 2024-04-29: 3 mL via INTRAVENOUS

## 2024-04-29 MED ORDER — CEPHALEXIN 500 MG PO CAPS: 500.0000 mg | ORAL_CAPSULE | Freq: Two times a day (BID) | ORAL | 0 refills | Status: AC

## 2024-04-29 MED ORDER — HYDROMORPHONE HCL 1 MG/ML IJ SOLN
1.0000 mg | Freq: Once | INTRAMUSCULAR | Status: AC
  Administered 2024-04-29: 1 mg via INTRAVENOUS
  Filled 2024-04-29: qty 1

## 2024-04-29 MED ORDER — PROMETHAZINE HCL 12.5 MG RE SUPP: 12.5000 mg | Freq: Four times a day (QID) | RECTAL | 0 refills | Status: AC | PRN

## 2024-04-29 MED ORDER — SODIUM CHLORIDE 0.9 % IV BOLUS
1000.0000 mL | Freq: Once | INTRAVENOUS | Status: AC
  Administered 2024-04-29: 1000 mL via INTRAVENOUS

## 2024-04-29 NOTE — ED Notes (Signed)
 Patient endorses emesis for one week but continues to be an ongoing chronic issue. Patient endorses she does take methadone  but has not had today's dose. Alert oriented x4.

## 2024-04-29 NOTE — Discharge Instructions (Addendum)
 Your history, exam, workup today are consistent with a likely urinary tract infection exacerbating your chronic abdominal troubles with nausea and vomiting.  Your hemoglobin was normal and you are not anemic today.  The CT scan we did did not reveal any evidence of acute surgical problem obstruction diverticulitis or abnormality at this time.  Your labs appeared similar to prior.  Your urinalysis did show some concern for UTI given the foul-smelling urine so please take the antibiotics for the next week to treat.  Please use the different Phenergan  suppository to help with your nausea and maintain hydration.  Please follow-up with your GI team and your PCP.  If any symptoms change or worsen acutely, please return to the nearest emergency department.

## 2024-04-29 NOTE — ED Triage Notes (Signed)
 Pt reports coffee ground emesis x 1 week. Told by her doctor to come to the ED for eval.  Has had endoscopy but has not had results.

## 2024-04-29 NOTE — ED Provider Notes (Signed)
 " [place] [facility] AT [facility] Provider Note   CSN: [id number] Arrival date & time: 04/29/24  1650     Patient presents with: Emesis   Br[place]any Mullins is a 44 y.o. female.   The history is provided by the patient, medical records and a significant other. No language interpreter was used.  Emesis Severity:  Severe Duration:  1 week Timing:  Constant Quality:  Coffee grounds Progression:  Worsening Chronicity:  Recurrent Recent urination:  Normal Relieved by:  Nothing Worsened by:  Nothing Ineffective treatments:  Mullins tried Associated symptoms: abdominal pain   Associated symptoms: no chills, no cough, no diarrhea, no fever, no headaches, no myalgias, no sore throat and no URI   Risk factors: prior abdominal surgery        Prior to Admission medications  Medication Sig Start Date End Date Taking? Authorizing Provider  acetaminophen  (TYLENOL ) 325 MG tablet Take 2 tablets (650 mg total) by mouth every 6 (six) hours as needed for mild pain (pain score 1-3). 05/16/23   Love, Sharlet RAMAN, PA-C  amLODipine  (NORVASC ) 5 MG tablet Take 1 tablet (5 mg total) by mouth daily. 06/18/23     ARIPiprazole  (ABILIFY ) 5 MG tablet Take 1 tablet (5 mg total) by mouth daily. 07/31/23   Jerral Meth, MD  aspirin  EC 81 MG tablet Take 1 tablet (81 mg total) by mouth daily. Swallow whole. 05/16/23   Love, Sharlet RAMAN, PA-C  atorvastatin  (LIPITOR) 40 MG tablet Take 1 tablet (40 mg total) by mouth daily. 05/16/23   Love, Sharlet RAMAN, PA-C  baclofen (LIORESAL) 20 MG tablet Take 20 mg by mouth 2 (two) times daily as needed. 06/05/23   [provider]  capsicum (ZOSTRIX) 0.075 % topical cream Apply topically 2 (two) times daily. Apply to shoulders--avoid eyes and broken skin 05/16/23   Love, Sharlet RAMAN, PA-C  cyclobenzaprine  (FLEXERIL ) 5 MG tablet Take 1 tablet (5 mg total) by mouth 3 (three) times daily as needed for muscle spasms. 05/16/23   Love, Sharlet RAMAN, PA-C   furosemide (LASIX) 20 MG tablet Take 20 mg by mouth daily. 07/05/23   [provider]  gabapentin  (NEURONTIN ) 300 MG capsule Take 1 capsule (300 mg total) by mouth 2 (two) times daily with breakfast and with lunch 05/16/23   Love, Sharlet RAMAN, PA-C  gabapentin  (NEURONTIN ) 400 MG capsule Take 1 capsule (400 mg total) by mouth at bedtime. 05/16/23   Love, Sharlet RAMAN, PA-C  hydrocortisone  (CORTEF ) 5 MG tablet Take 4 tablets (20 mg) by mouth daily in the morning and 2 tablets (10 mg) in the evening over the next 3 days then go back to home dose of 2 tablets (10 mg) in the morning, and 1 tablet (5mg ) in the evening. 05/22/23   Elgergawy, Brayton RAMAN, MD  hydrOXYzine  (ATARAX ) 10 MG tablet Take 1 tablet (10 mg total) by mouth 3 (three) times daily as needed for anxiety. 05/16/23   Love, Sharlet RAMAN, PA-C  hyoscyamine  (LEVSIN AMIEL) 0.125 MG SL tablet Place 1 tablet (0.125 mg total) under the tongue every 4 (four) hours as needed. 08/20/23   Haze Lonni PARAS, MD  lamoTRIgine  (LAMICTAL ) 25 MG tablet Take 1 tablet (25 mg total) by mouth 2 (two) times daily. 10/23/23   Georjean Darice HERO, MD  levETIRAcetam  (KEPPRA ) 500 MG tablet Take 1 tablet (500 mg total) by mouth every 8 (eight) hours. 10/23/23   Georjean Darice HERO, MD  losartan  (COZAAR ) 25 MG tablet Take 1 tablet (25  mg total) by mouth daily. 06/18/23     methadone  (DOLOPHINE ) 10 MG/ML solution Take 125 mg by mouth daily.    [provider]  naloxone  (NARCAN ) 0.4 MG/ML injection Inject 1 mL (0.4 mg total) into the vein as needed. 05/01/23   Ezenduka, Nkeiruka J, MD  ondansetron  (ZOFRAN -ODT) 4 MG disintegrating tablet Take 1 tablet (4 mg total) by mouth every 8 (eight) hours as needed for nausea or vomiting. 02/20/24   Ruthell Lonni FALCON, PA-C  pantoprazole  (PROTONIX ) 40 MG tablet Take 1 tablet (40 mg total) by mouth daily at 6 (six) AM. 05/22/23   Elgergawy, Brayton RAMAN, MD  polyethylene glycol (MIRALAX ) 17 g packet Take 17 g by mouth daily. 08/20/23   Haze Lonni PARAS, MD  traZODone  (DESYREL ) 50 MG tablet Take 1 tablet (50 mg total) by mouth at bedtime as needed for sleep. 05/17/23   Love, Sharlet RAMAN, PA-C  VIENVA  0.1-20 MG-MCG tablet Take 1 tablet by mouth daily. 03/21/23   [provider]    Allergies: Bee venom, Iodinated contrast media, Other, Toradol [ketorolac tromethamine], and Benadryl  [diphenhydramine ]    Review of Systems  Constitutional:  Negative for chills, diaphoresis, fatigue and fever.  HENT:  Negative for congestion and sore throat.   Respiratory:  Negative for cough, chest tightness, shortness of breath and wheezing.   Cardiovascular:  Negative for chest pain, palpitations and leg swelling.  Gastrointestinal:  Positive for abdominal pain, nausea and vomiting. Negative for abdominal distention, constipation and diarrhea.  Genitourinary:  Negative for dysuria, flank pain, frequency, pelvic pain, vaginal bleeding, vaginal discharge and vaginal pain.  Musculoskeletal:  Negative for back pain, myalgias, neck pain and neck stiffness.  Skin:  Negative for rash and wound.  Neurological:  Negative for weakness, light-headedness, numbness and headaches.  Psychiatric/Behavioral:  Negative for agitation and confusion.   All other systems reviewed and are negative.   Updated Vital Signs BP (!) 169/107   Pulse (!) 117   Temp 98.3 F (36.8 C)   Resp 16   Ht 5' 3 (1.6 m)   Wt 97.5 kg   SpO2 98%   BMI 38.09 kg/m   Physical Exam Vitals and nursing note reviewed.  Constitutional:      General: She is not in acute distress.    Appearance: She is well-developed. She is not ill-appearing, toxic-appearing or diaphoretic.  HENT:     Head: Normocephalic and atraumatic.     Nose: No congestion or rhinorrhea.     Mouth/Throat:     Mouth: Mucous membranes are dry.     Pharynx: No oropharyngeal exudate or posterior oropharyngeal erythema.  Eyes:     Extraocular Movements: Extraocular movements intact.     Conjunctiva/sclera:  Conjunctivae normal.     Pupils: Pupils are equal, round, and reactive to light.  Cardiovascular:     Rate and Rhythm: Regular rhythm. Tachycardia present.     Heart sounds: No murmur heard. Pulmonary:     Effort: Pulmonary effort is normal. No respiratory distress.     Breath sounds: Normal breath sounds. No wheezing, rhonchi or rales.  Chest:     Chest wall: No tenderness.  Abdominal:     General: Abdomen is flat.     Palpations: Abdomen is soft.     Tenderness: There is abdominal tenderness. There is no right CVA tenderness, left CVA tenderness or guarding.  Musculoskeletal:        General: No swelling or tenderness.     Cervical back:  Neck supple.     Right lower leg: No edema.     Left lower leg: No edema.  Skin:    General: Skin is warm and dry.     Capillary Refill: Capillary refill takes less than 2 seconds.     Findings: No erythema or rash.  Neurological:     General: No focal deficit present.     Mental Status: She is alert.  Psychiatric:        Mood and Affect: Mood normal.     (all labs ordered are listed, but only abnormal results are displayed) Labs Reviewed  COMPREHENSIVE METABOLIC PANEL WITH GFR - Abnormal; Notable for the following components:      Result Value   AST 42 (*)    Anion gap 16 (*)    All other components within normal limits  URINALYSIS, ROUTINE W REFLEX MICROSCOPIC - Abnormal; Notable for the following components:   APPearance HAZY (*)    Ketones, ur 20 (*)    Protein, ur 30 (*)    Leukocytes,Ua TRACE (*)    Bacteria, UA RARE (*)    All other components within normal limits  CBC WITH DIFFERENTIAL/PLATELET  LIPASE, BLOOD  I-STAT CG4 LACTIC ACID, ED    EKG: Mullins  Radiology: CT ABDOMEN PELVIS WO CONTRAST Result Date: 04/29/2024 EXAM: CT ABDOMEN AND PELVIS WITHOUT CONTRAST 04/29/2024 09:41:05 PM TECHNIQUE: CT of the abdomen and pelvis was performed without the administration of intravenous contrast. Multiplanar reformatted images are  provided for review. Automated exposure control, iterative reconstruction, and/or weight-based adjustment of the mA/kV was utilized to reduce the radiation dose to as low as reasonably achievable. COMPARISON: 02/20/2024 CLINICAL HISTORY: Severe abdominal pain with coffee-ground emesis, nausea, and vomiting. Worse than prior. Intra-abdominal abnormality such as obstruction or diverticulitis. FINDINGS: LOWER CHEST: No acute abnormality. LIVER: The liver is unremarkable. GALLBLADDER AND BILE DUCTS: Status post cholecystectomy. No biliary ductal dilatation. SPLEEN: No acute abnormality. PANCREAS: No acute abnormality. ADRENAL GLANDS: No acute abnormality. KIDNEYS, URETERS AND BLADDER: Punctate 1 - 2 mm nonobstructing calculi are seen within the kidneys bilaterally. No ureteral calculi. No hydronephrosis. No perinephric inflammatory stranding or fluid collections were identified. The bladder is unremarkable. GI AND BOWEL: Appendix absent. The stomach, small bowel, and large bowel are otherwise unremarkable. There is no bowel obstruction. PERITONEUM AND RETROPERITONEUM: No ascites. No free air. VASCULATURE: Aorta is normal in caliber. LYMPH NODES: No lymphadenopathy. REPRODUCTIVE ORGANS: Uterus absent. 3.2 cm simple-appearing cyst within the residual left ovary, likely representing a follicular cyst in the patient of this age. No follow up imaging recommended. BONES AND SOFT TISSUES: Osseous structures are age appropriate. No acute bone abnormality. No lytic or blastic bone lesion. No focal soft tissue abnormality. IMPRESSION: 1. No acute findings in the abdomen or pelvis. 2. Punctate 1-2 mm nonobstructing bilateral renal calculi, without hydronephrosis or ureteral calculi . 3. Status post cholecystectomy, hysterectomy  and appendectomy . Electronically signed by: Dorethia Molt MD 04/29/2024 09:56 PM EST RP Workstation: [id number]     Procedures   Medications Ordered in the ED  HYDROmorphone  (DILAUDID ) injection 1  mg (has no administration in time range)  ondansetron  (ZOFRAN ) injection 4 mg (4 mg Intravenous Given 04/29/24 2114)  sodium chloride  flush (NS) 0.9 % injection 3 mL (3 mLs Intravenous Given 04/29/24 2243)  HYDROmorphone  (DILAUDID ) injection 1 mg (1 mg Intravenous Given 04/29/24 2114)  promethazine  (PHENERGAN ) 25 mg in sodium chloride  0.9 % 50 mL IVPB (0 mg Intravenous Stopped 04/29/24 2303)  sodium  chloride 0.9 % bolus 1,000 mL (1,000 mLs Intravenous New Bag/Given 04/29/24 2126)                                    Medical Decision Making Amount and/or Complexity of Data Reviewed Labs: ordered. Radiology: ordered.  Risk Prescription drug management.    Paw Karstens is a 44 y.o. female with a past medical history significant for Addison's disease, previous stroke, hypertension, seizures, PCOS, chronic pancreatitis, previous cholecystectomy, previous appendectomy, previous hysterectomy, previous gastric ulcer, previous pancreatic stenting and renal stenting, and chronic pain who presents with 1 week of worsening coffee-ground emesis, nausea, vomiting, and abdominal pain.  According to patient, she has had abdominal pain for months and previously has had a biopsy of her stomach from an endoscopy within the last few months but has not been able to see those results.  She reports that over the last week it has rapidly worsened and now the pain is severe.  She reports pain is primarily on the central and right abdomen and feels worse than before.  She denies any trauma.  She reports nausea and vomiting with any oral intake and she has been vomiting up her home medications including her methadone  for the last 2 days.  She does not think she is withdrawing.  She denies any fevers, chills, congestion, cough.  Denies any constipation, diarrhea, or urinary changes.  Denies vaginal complaints.  Denies any rash.  Pain is not in the back chest or extremities it is only in the abdomen.  On exam,  lungs clear.  Chest nontender.  Abdomen is quite tender in the central and right abdomen.  I did hear bowel sounds.  Back and flanks nontender.  Mucous membranes are dry.  Patient moving all extremities.  Intact pulses in upper extremities.  We had a shared decision-making conversation and due to the worsening pain in her previous surgeries, we will get a CT scan without contrast to rule out an acute surgical problem or perforation but we will give her some pain medicine nausea medicine and fluids.  Will keep her n.p.o. and get screening labs and urinalysis.  Anticipate reassessment after workup to determine disposition.  CT scan did not show acute surgical problem.  Labs appear similar to prior overall although she does have leukocytes and bacteria.  She does now tell me she has had some foul-smelling urine and think she could have a UTI and took some Azo earlier.  Will treat for UTI.  Patient felt better after medications.  Given lack of surgical problem or significant findings on her labs we will plan for discharge home.  Patient agrees.  Will send a prescription for Keflex  and she reports that Phenergan  suppository may work better for her so we will send in prescription for that as well.  She will follow-up with her GI team and her PCP.  Anticipate discharge after fluids are completed.      Final diagnoses:  Acute cystitis without hematuria  Nausea and vomiting, unspecified vomiting type  Generalized abdominal pain    ED Discharge Orders          Ordered    cephALEXin  (KEFLEX ) 500 MG capsule  2 times daily        04/29/24 2313    promethazine  (PHENERGAN ) 12.5 MG suppository  Every 6 hours PRN        04/29/24 2313  Clinical Impression: 1. Acute cystitis without hematuria   2. Nausea and vomiting, unspecified vomiting type   3. Generalized abdominal pain     Disposition: Discharge  Condition: Good  I have discussed the results, Dx and Tx plan with the pt(&  family if present). He/she/they expressed understanding and agree(s) with the plan. Discharge instructions discussed at great length. Strict return precautions discussed and pt &/or family have verbalized understanding of the instructions. No further questions at time of discharge.    New Prescriptions   CEPHALEXIN  (KEFLEX ) 500 MG CAPSULE    Take 1 capsule (500 mg total) by mouth 2 (two) times daily for 7 days.   PROMETHAZINE  (PHENERGAN ) 12.5 MG SUPPOSITORY    Place 1 suppository (12.5 mg total) rectally every 6 (six) hours as needed for nausea or vomiting.    Follow Up: Rebbeca Ulla SQUIBB, NP [address] [place] [address] [phone]     your GI team         Jean Skow, Lonni PARAS, MD 04/29/24 2315  "

## 2024-05-29 ENCOUNTER — Ambulatory Visit: Admitting: Neurology

## 6572-08-31 DEATH — deceased
